# Patient Record
Sex: Female | Born: 1985 | Race: White | Hispanic: No | Marital: Single | Smoking: Current every day smoker
Health system: Southern US, Community
[De-identification: ages and names within clinical notes are randomized; demographics above are authoritative.]

## PROBLEM LIST (undated history)

## (undated) ENCOUNTER — Ambulatory Visit: Admission: EM | Payer: Medicaid Other | Source: Home / Self Care

## (undated) DIAGNOSIS — K589 Irritable bowel syndrome without diarrhea: Secondary | ICD-10-CM

## (undated) DIAGNOSIS — K5289 Other specified noninfective gastroenteritis and colitis: Secondary | ICD-10-CM

## (undated) DIAGNOSIS — F419 Anxiety disorder, unspecified: Secondary | ICD-10-CM

## (undated) DIAGNOSIS — J45909 Unspecified asthma, uncomplicated: Secondary | ICD-10-CM

## (undated) DIAGNOSIS — K219 Gastro-esophageal reflux disease without esophagitis: Secondary | ICD-10-CM

## (undated) DIAGNOSIS — F4310 Post-traumatic stress disorder, unspecified: Secondary | ICD-10-CM

## (undated) DIAGNOSIS — M009 Pyogenic arthritis, unspecified: Secondary | ICD-10-CM

## (undated) DIAGNOSIS — R51 Headache: Secondary | ICD-10-CM

## (undated) DIAGNOSIS — K3184 Gastroparesis: Secondary | ICD-10-CM

## (undated) DIAGNOSIS — M797 Fibromyalgia: Secondary | ICD-10-CM

## (undated) DIAGNOSIS — IMO0002 Reserved for concepts with insufficient information to code with codable children: Secondary | ICD-10-CM

## (undated) DIAGNOSIS — R011 Cardiac murmur, unspecified: Secondary | ICD-10-CM

## (undated) DIAGNOSIS — I509 Heart failure, unspecified: Secondary | ICD-10-CM

## (undated) DIAGNOSIS — O211 Hyperemesis gravidarum with metabolic disturbance: Secondary | ICD-10-CM

## (undated) DIAGNOSIS — F1121 Opioid dependence, in remission: Secondary | ICD-10-CM

## (undated) DIAGNOSIS — Z3490 Encounter for supervision of normal pregnancy, unspecified, unspecified trimester: Secondary | ICD-10-CM

## (undated) DIAGNOSIS — R7881 Bacteremia: Secondary | ICD-10-CM

## (undated) DIAGNOSIS — K760 Fatty (change of) liver, not elsewhere classified: Secondary | ICD-10-CM

## (undated) DIAGNOSIS — D649 Anemia, unspecified: Secondary | ICD-10-CM

## (undated) HISTORY — DX: Headache: R51

## (undated) HISTORY — DX: Heart failure, unspecified: I50.9

## (undated) HISTORY — PX: APPENDECTOMY: SHX54

## (undated) HISTORY — DX: Gastro-esophageal reflux disease without esophagitis: K21.9

## (undated) HISTORY — DX: Opioid dependence, in remission: F11.21

## (undated) HISTORY — DX: Fatty (change of) liver, not elsewhere classified: K76.0

## (undated) HISTORY — DX: Irritable bowel syndrome, unspecified: K58.9

## (undated) HISTORY — DX: Gastroparesis: K31.84

## (undated) HISTORY — DX: Anemia, unspecified: D64.9

## (undated) HISTORY — DX: Reserved for concepts with insufficient information to code with codable children: IMO0002

## (undated) HISTORY — PX: CHOLECYSTECTOMY: SHX55

## (undated) HISTORY — PX: COLONOSCOPY: SHX174

## (undated) HISTORY — DX: Other specified noninfective gastroenteritis and colitis: K52.89

## (undated) HISTORY — DX: Hyperemesis gravidarum with metabolic disturbance: O21.1

## (undated) HISTORY — DX: Unspecified asthma, uncomplicated: J45.909

## (undated) HISTORY — PX: TUBAL LIGATION: SHX77

---
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
## 2004-04-22 ENCOUNTER — Emergency Department (HOSPITAL_COMMUNITY): Admission: EM | Admit: 2004-04-22 | Discharge: 2004-04-22 | Payer: Self-pay | Admitting: *Deleted

## 2006-03-06 ENCOUNTER — Emergency Department (HOSPITAL_COMMUNITY): Admission: EM | Admit: 2006-03-06 | Discharge: 2006-03-06 | Payer: Self-pay | Admitting: Emergency Medicine

## 2006-03-19 ENCOUNTER — Emergency Department (HOSPITAL_COMMUNITY): Admission: EM | Admit: 2006-03-19 | Discharge: 2006-03-19 | Payer: Self-pay | Admitting: Emergency Medicine

## 2006-08-20 ENCOUNTER — Emergency Department (HOSPITAL_COMMUNITY): Admission: EM | Admit: 2006-08-20 | Discharge: 2006-08-20 | Payer: Self-pay | Admitting: Emergency Medicine

## 2006-09-17 ENCOUNTER — Observation Stay (HOSPITAL_COMMUNITY): Admission: RE | Admit: 2006-09-17 | Discharge: 2006-09-18 | Payer: Self-pay | Admitting: General Surgery

## 2006-09-17 ENCOUNTER — Encounter (INDEPENDENT_AMBULATORY_CARE_PROVIDER_SITE_OTHER): Payer: Self-pay | Admitting: General Surgery

## 2006-12-14 ENCOUNTER — Emergency Department (HOSPITAL_COMMUNITY): Admission: EM | Admit: 2006-12-14 | Discharge: 2006-12-14 | Payer: Self-pay | Admitting: Emergency Medicine

## 2007-03-06 ENCOUNTER — Ambulatory Visit: Payer: Self-pay | Admitting: Gastroenterology

## 2007-03-06 ENCOUNTER — Inpatient Hospital Stay (HOSPITAL_COMMUNITY): Admission: EM | Admit: 2007-03-06 | Discharge: 2007-03-07 | Payer: Self-pay | Admitting: Emergency Medicine

## 2007-03-06 ENCOUNTER — Encounter: Payer: Self-pay | Admitting: Gastroenterology

## 2007-03-07 ENCOUNTER — Ambulatory Visit: Payer: Self-pay | Admitting: Gastroenterology

## 2007-03-19 ENCOUNTER — Ambulatory Visit: Payer: Self-pay | Admitting: Gastroenterology

## 2007-03-24 ENCOUNTER — Ambulatory Visit (HOSPITAL_COMMUNITY): Admission: RE | Admit: 2007-03-24 | Discharge: 2007-03-24 | Payer: Self-pay | Admitting: Gastroenterology

## 2007-03-31 ENCOUNTER — Encounter (HOSPITAL_COMMUNITY): Admission: RE | Admit: 2007-03-31 | Discharge: 2007-04-30 | Payer: Self-pay | Admitting: Gastroenterology

## 2007-04-23 ENCOUNTER — Ambulatory Visit: Payer: Self-pay | Admitting: Gastroenterology

## 2007-07-30 ENCOUNTER — Ambulatory Visit: Payer: Self-pay | Admitting: Gastroenterology

## 2007-11-19 ENCOUNTER — Emergency Department (HOSPITAL_COMMUNITY): Admission: EM | Admit: 2007-11-19 | Discharge: 2007-11-19 | Payer: Self-pay | Admitting: Emergency Medicine

## 2007-12-01 ENCOUNTER — Emergency Department (HOSPITAL_COMMUNITY): Admission: EM | Admit: 2007-12-01 | Discharge: 2007-12-01 | Payer: Self-pay | Admitting: Emergency Medicine

## 2007-12-19 ENCOUNTER — Emergency Department (HOSPITAL_COMMUNITY): Admission: EM | Admit: 2007-12-19 | Discharge: 2007-12-19 | Payer: Self-pay | Admitting: Emergency Medicine

## 2008-02-02 ENCOUNTER — Emergency Department (HOSPITAL_COMMUNITY): Admission: EM | Admit: 2008-02-02 | Discharge: 2008-02-02 | Payer: Self-pay | Admitting: Emergency Medicine

## 2008-03-16 ENCOUNTER — Other Ambulatory Visit: Admission: RE | Admit: 2008-03-16 | Discharge: 2008-03-16 | Payer: Self-pay | Admitting: Obstetrics and Gynecology

## 2008-04-28 ENCOUNTER — Telehealth (INDEPENDENT_AMBULATORY_CARE_PROVIDER_SITE_OTHER): Payer: Self-pay

## 2008-04-28 DIAGNOSIS — R51 Headache: Secondary | ICD-10-CM

## 2008-04-28 DIAGNOSIS — K3184 Gastroparesis: Secondary | ICD-10-CM

## 2008-04-28 DIAGNOSIS — F411 Generalized anxiety disorder: Secondary | ICD-10-CM | POA: Insufficient documentation

## 2008-04-28 DIAGNOSIS — J45909 Unspecified asthma, uncomplicated: Secondary | ICD-10-CM | POA: Insufficient documentation

## 2008-04-28 DIAGNOSIS — R109 Unspecified abdominal pain: Secondary | ICD-10-CM | POA: Insufficient documentation

## 2008-04-28 DIAGNOSIS — R110 Nausea: Secondary | ICD-10-CM | POA: Insufficient documentation

## 2008-04-28 DIAGNOSIS — R519 Headache, unspecified: Secondary | ICD-10-CM | POA: Insufficient documentation

## 2008-04-29 ENCOUNTER — Ambulatory Visit: Payer: Self-pay | Admitting: Internal Medicine

## 2008-04-29 DIAGNOSIS — K5289 Other specified noninfective gastroenteritis and colitis: Secondary | ICD-10-CM

## 2008-04-29 DIAGNOSIS — K5900 Constipation, unspecified: Secondary | ICD-10-CM | POA: Insufficient documentation

## 2008-07-01 ENCOUNTER — Emergency Department (HOSPITAL_COMMUNITY): Admission: EM | Admit: 2008-07-01 | Discharge: 2008-07-01 | Payer: Self-pay | Admitting: Emergency Medicine

## 2008-10-08 ENCOUNTER — Emergency Department (HOSPITAL_COMMUNITY): Admission: EM | Admit: 2008-10-08 | Discharge: 2008-10-08 | Payer: Self-pay | Admitting: Emergency Medicine

## 2008-10-23 ENCOUNTER — Inpatient Hospital Stay (HOSPITAL_COMMUNITY): Admission: AD | Admit: 2008-10-23 | Discharge: 2008-10-26 | Payer: Self-pay | Admitting: Obstetrics and Gynecology

## 2009-02-02 ENCOUNTER — Ambulatory Visit: Payer: Self-pay | Admitting: Gastroenterology

## 2009-02-02 DIAGNOSIS — D649 Anemia, unspecified: Secondary | ICD-10-CM

## 2009-02-02 DIAGNOSIS — K219 Gastro-esophageal reflux disease without esophagitis: Secondary | ICD-10-CM

## 2009-02-02 DIAGNOSIS — O211 Hyperemesis gravidarum with metabolic disturbance: Secondary | ICD-10-CM | POA: Insufficient documentation

## 2009-02-02 DIAGNOSIS — K589 Irritable bowel syndrome without diarrhea: Secondary | ICD-10-CM

## 2009-02-03 ENCOUNTER — Encounter: Payer: Self-pay | Admitting: Urgent Care

## 2009-02-06 LAB — CONVERTED CEMR LAB
Basophils Absolute: 0 10*3/uL (ref 0.0–0.1)
Basophils Relative: 0 % (ref 0–1)
Cortisol - AM: 5.9 ug/dL (ref 4.3–22.4)
Eosinophils Absolute: 0.2 10*3/uL (ref 0.0–0.7)
Hemoglobin: 13.8 g/dL (ref 12.0–15.0)
Hgb A1c MFr Bld: 5.6 % (ref 4.6–6.1)
MCV: 91.4 fL (ref 78.0–100.0)
Monocytes Absolute: 0.5 10*3/uL (ref 0.1–1.0)
Monocytes Relative: 7 % (ref 3–12)
Platelets: 250 10*3/uL (ref 150–400)
RDW: 13.7 % (ref 11.5–15.5)

## 2009-02-07 ENCOUNTER — Ambulatory Visit (HOSPITAL_COMMUNITY): Admission: RE | Admit: 2009-02-07 | Discharge: 2009-02-07 | Payer: Self-pay | Admitting: Neurology

## 2009-03-09 ENCOUNTER — Encounter (INDEPENDENT_AMBULATORY_CARE_PROVIDER_SITE_OTHER): Payer: Self-pay | Admitting: *Deleted

## 2009-04-14 ENCOUNTER — Ambulatory Visit (HOSPITAL_COMMUNITY): Admission: RE | Admit: 2009-04-14 | Discharge: 2009-04-14 | Payer: Self-pay | Admitting: Family Medicine

## 2009-05-22 ENCOUNTER — Telehealth (INDEPENDENT_AMBULATORY_CARE_PROVIDER_SITE_OTHER): Payer: Self-pay

## 2009-06-01 ENCOUNTER — Encounter (INDEPENDENT_AMBULATORY_CARE_PROVIDER_SITE_OTHER): Payer: Self-pay | Admitting: *Deleted

## 2009-06-08 ENCOUNTER — Ambulatory Visit: Payer: Self-pay | Admitting: Gastroenterology

## 2009-06-08 DIAGNOSIS — R1319 Other dysphagia: Secondary | ICD-10-CM

## 2009-06-29 ENCOUNTER — Ambulatory Visit: Payer: Self-pay | Admitting: Gastroenterology

## 2009-06-29 ENCOUNTER — Ambulatory Visit (HOSPITAL_COMMUNITY): Admission: RE | Admit: 2009-06-29 | Discharge: 2009-06-29 | Payer: Self-pay | Admitting: Gastroenterology

## 2009-06-29 ENCOUNTER — Telehealth (INDEPENDENT_AMBULATORY_CARE_PROVIDER_SITE_OTHER): Payer: Self-pay

## 2009-08-30 ENCOUNTER — Emergency Department (HOSPITAL_COMMUNITY): Admission: EM | Admit: 2009-08-30 | Discharge: 2009-08-30 | Payer: Self-pay | Admitting: Emergency Medicine

## 2009-09-26 ENCOUNTER — Emergency Department (HOSPITAL_COMMUNITY): Admission: EM | Admit: 2009-09-26 | Discharge: 2009-09-26 | Payer: Self-pay | Admitting: Internal Medicine

## 2010-02-01 ENCOUNTER — Ambulatory Visit (HOSPITAL_COMMUNITY)
Admission: RE | Admit: 2010-02-01 | Discharge: 2010-02-01 | Payer: Self-pay | Source: Home / Self Care | Attending: Family Medicine | Admitting: Family Medicine

## 2010-02-15 NOTE — Letter (Signed)
Summary: EGD/ED ORDER  EGD/ED ORDER   Imported By: Ave Filter 06/08/2009 13:00:02  _____________________________________________________________________  External Attachment:    Type:   Image     Comment:   External Document

## 2010-02-15 NOTE — Assessment & Plan Note (Signed)
Summary: bloating & abdominal pain- cdg   Visit Type:  Follow-up Visit Primary Care Provider:  Dr. Lynett Fish  Chief Complaint:  bloating and abd pain.  History of Present Illness: 25 y/o caucasian female with hx GERD, IBS, gastroparesis, & hyperemesis gravidarum at last OV here.  Out of prilosec for the last year, but taking spaingly when needed OTC.  c/o heartburn all day.   Nausea w/ some dry heaves without vomiting since delivery.  s/p full term pregnancy w/ 58 month old infant.  c/o abd bloating.  Also c/o post-prandial urgency to defecate.  c/o loose stools 5 times/day with some mucous since delivery.  c/o "shooting" lower abd pain resolved w/ defecation.  MRI tomorrow for "bad HA" every day all day.  Seeing Dr Gerilyn Pilgrim.  Taking fiorcet.  Started celexa 6 weeks ago.  Denies NSAIDs.   She had a C-reactive protein, tissue transglutaminase and quantitative immunoglobulin which were all within normal limits previously.  On iron since delivery.  Hgb 11 grams 1 month post-partum per pt.    02/2007 ILEOCOLONOSCOPY FINDINGS: Bx non-specific colitis, differentials include ischemia, infection, & NSAIDs. Patchy erythema with ulceration and mucosal sparing seen beginning approximately 20 cm from the anal verge and extending to the hepatic flexure.  The rectum, ascending colon, and cecum were spared, normal TI.       This is a 25 year old female who presents with iron deficiency anemia.  Prior effective treatment has included iron supplementation.  The patient complains of fatigue, but denies black stools.         The patient also presents with heartburn / GERD.  The patient complains of heartburn, regurgtaition of food, sour taste in mouth, and weight gain, but denies epigastric pain, chest pain, trouble swallowing, and weight loss.    Current Problems (verified): 1)  Hx of Hyperemesis Gravidarum  (ICD-643.10) 2)  Anemia  (ICD-285.9) 3)  Constipation  (ICD-564.00) 4)  Headache  (ICD-784.0) 5)  Asthma   (ICD-493.90) 6)  Gastroparesis  (ICD-536.3) 7)  Anxiety  (ICD-300.00) 8)  Hx of Colitis  (ICD-558.9) 9)  Nausea  (ICD-787.02) 10)  Abdominal Pain  (ICD-789.00)  Current Medications (verified): 1)  Prilosec Otc 20 Mg Tbec (Omeprazole Magnesium) .... Once Daily 2)  Celexa 20 Mg Tabs (Citalopram Hydrobromide) .... Once Daily 3)  Ferrous Sulfate 325 (65 Fe) Mg Tabs (Ferrous Sulfate) .... Two Times A Day 4)  Fioricet 50-325-40 Mg Tabs (Butalbital-Apap-Caffeine) .... As Needed  Allergies: 1)  ! Zithromax 2)  ! Prednisone 3)  ! Pcn  Past History:  Past Medical History: ABDOMINAL PAIN (ICD-789.00) Hx of HYPEREMESIS GRAVIDARUM (ICD-643.10) ANEMIA (ICD-285.9) ? related to pregnancy CONSTIPATION (ICD-564.00) HEADACHE (ICD-784.0) ASTHMA (ICD-493.90) GASTROPARESIS (ICD-536.3) ANXIETY (ICD-300.00) Hx of non-specific COLITIS (ICD-558.9)  Past Surgical History: APPENDECTOMY 2004 Cholecystectomy  Family History: mother IBS, otherwise No known family history of colorectal carcinoma, IBD, liver or chronic GI problems.  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   Head:  Normocephalic and atraumatic. Eyes:  Sclera clear, no icterus. Ears:  Normal auditory acuity. Mouth:  No deformity or lesions, dentition normal. Neck:  Supple; no masses or thyromegaly. Heart:  Regular rate and rhythm; no murmurs, rubs,  or bruits. Abdomen:  normal bowel sounds, obese, without guarding, without rebound, no hernia, no distesion, no tenderness, no masses, and no hepatomegally or splenomegaly.   Msk:  Symmetrical with no gross  deformities. Normal posture. Pulses:  Normal pulses noted. Extremities:  No clubbing, cyanosis, edema or deformities noted. Neurologic:  Alert and  oriented x4;  grossly normal neurologically. Skin:  Intact without significant lesions or rashes. Cervical Nodes:  No significant cervical adenopathy. Psych:  Alert and cooperative. Normal mood and affect.  Impression & Recommendations:  Problem # 1:  GASTROPARESIS (ICD-536.3) 25 y/o caucasian female presents for follow up for gastroparesis, suspected IBS, & Chronic GERD.  She has hx of medication and recommendation non-compliance.  She has been having daily diarrhea & GERD x 3 months.  c/o Nausea without emesis.  Hx anemia, possibly due to pregnancy, on iron.  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  Previous GI work-up extensive including colonoscopy, labs for celiac disease, GES, but she has never had EGD.  If she has PPI failure for GERD, would proceed with EGD.  If she remains w/ IDA, would check stool for occult bleeding, ? underlying IBD, but suspect this is physiologic to pregnancy.  Gastroparesis culprit most likely depression but may have underlying DM.   Orders: T-CBC w/Diff ([id number]) T-Sed Rate (Automated) [phone]) T-Hemoglobin A1C (29[address]) T-TSH ([id number]) T-Cortisol, AM ([address])  Problem # 2:  GERD (ICD-530.81) See #1  Problem # 3:  IRRITABLE BOWEL SYNDROME (ICD-564.1) See#1  Problem # 4:  ANEMIA (ICD-285.9) See #1  Other Orders: Est. Patient Level IV (284[address])  Patient Instructions: 1)  Resume omeprazole 20mg  daily for acid reflux 2)  Take dicyclomine 10mg  48m ins before meals 3)  Gastroparesis diet 4)  Labs today/tomorrow 5)  The medication list was reviewed and reconciled.  All changed / newly prescribed medications were explained.  A complete medication list was provided to the patient / caregiver. Prescriptions: DICYCLOMINE HCL 10 MG CAPS (DICYCLOMINE HCL) one by mouth 30 mins before meals (up to three  times a day)  #90 x 2   Entered and Authorized by:   Joselyn Arrow FNP-BC   Signed by:   Joselyn Arrow FNP-BC on 02/02/2009   Method used:   Electronically to        [facility] [phone]* (retail)       [address] [place], [place]  [address]       Ph: [phone]       Fax: [phone]   RxID:   [id number] PRILOSEC OTC 20 MG TBEC (OMEPRAZOLE MAGNESIUM) once daily  #31 x 11   Entered and Authorized by:   Joselyn Arrow FNP-BC   Signed by:   Joselyn Arrow FNP-BC on 02/02/2009   Method used:   Electronically to        [facility] [address] [phone]* (retail)       [address] [place], [place]  [address]       Ph: [phone]       Fax: [phone]   RxID:   [id number]   Appended Document: bloating & abdominal pain- cdg Schedule pt  OPV in 2 mos w/ slf. She has gained  ~41 lbs since APR 2009. Will await labs. EGD perhaps if labs show indication. Most likely pt has IBS/worsening GERD due to weight gain.  Appended Document: bloating & abdominal pain- cdg Please call pt. We do not prescribe narcotics for IBS. She should use tylenol and continue Dicyclomine and Prilosec.  Appended Document: bloating & abdominal pain- cdg pt aware

## 2010-02-15 NOTE — Letter (Signed)
Summary: DO NOT USE URG SLOTS UNLESS APPROVED BY [facility]./MM  Phone Note Call from Patient   Caller: Patient Summary of Call: pt called saying she is having a lot of stomach problems and felt like she was going to develope stomach cancer before her appt on June 9th with Dr. Darrick Penna. She wants to be seen before then. Please advise what to do with her. She can be reached @ [phone]  Initial call taken by: Peggyann Shoals,  Jun 01, 2009 9:31 AM     Appended Document:  LMOM to call.  Appended Document:  Pt is requesting an appt earlier than June 9th with Dr. Darrick Penna. Diarrhea x 7 today already. mucus in stool. abd pain. Heart burn after each meal. yesterday she had corn pops for breakfast, ham sandwich (meat and bread only ) for lunch, and supper had macaroni salad. I told her Dr. Darrick Penna is away but i would check with Dr. Jena Gauss and see what he recommends until Dr. Darrick Penna gets back.   Appended Document:  I would offer appt next week w extender (or urgent w Dr. Darrick Penna)  Appended Document:  Pt aware and Darl Pikes to schedule.  Appended Document:  I cancelled previous appt for 6-9 and made appt for 5-26 w/SF in urgent spot. Pt still isnt happy with that date, but I told her that is all that is available.

## 2010-02-15 NOTE — Progress Notes (Signed)
Summary: abd pain  Phone Note Call from Patient   Caller: Patient Summary of Call: pt called- has been having some abd pain again that makes her legs hurt as well. she felt she was doing good on" the hydrocodone" and wants to know if [facility] will call in some for her again to last untill her ov appt June 9th. please advise Initial call taken by: Hendricks Limes LPN,  May 22, 9809 12:15 PM     Appended Document: abd pain Please call pt. We do not prescribe narcotics for IBS. She should use tylenol and continue Dicyclomine and Prilosec.   Signed by West Bali MD on 05/23/2009 at 7:57 AM  Appended Document: abd pain pt aware

## 2010-02-15 NOTE — Progress Notes (Signed)
Summary: phone note/throat pain from EGD  Phone Note Call from Patient   Caller: Patient Summary of Call: Pt called and said she had EGD/ED this Am and her throat is hurting. She requested something for the pain. I told her it is  normal to have some pain immediatedly following and invasive procedure. I would make Dr. Darrick Penna aware that she has called and for pt to call back if pain worsens.  Initial call taken by: Cloria Spring LPN,  June 29, 2009 10:43 AM     Appended Document: phone note/throat pain from EGD Please call pt. She may have Chloraseptic Spray top as needed throat pain, #1, rfx0.  Appended Document: phone note/throat pain from EGD LMOM to call.  Appended Document: phone note/throat pain from EGD Pt informed.

## 2010-02-15 NOTE — Letter (Signed)
Summary: Appointment Reminder  [facility]  [address]   [place], [place] [address]   Phone: [phone]  Fax: [phone]       March 09, 2009   [name] [address] [place], [place]  [address] 08-Jun-1985    Dear [name],  We have been unable to reach you by phone to schedule a follow up   appointment that was recommended for you by Dr. Darrick Penna. It is very   important that we reach you to schedule an appointment. We hope that you  allow [place] to participate in your health care needs. Please contact us at  [phone] at your earliest convenience to schedule your appointment.  Sincerely,    [facility] R. Roetta Sessions, M.D.    Kassie Mends, M.D. Lorenza Burton, FNP-BC    Tana Coast, PA-C Phone: [phone]    Fax: [phone]

## 2010-02-15 NOTE — Assessment & Plan Note (Signed)
Summary: DYSPHAGIA, IBS, GASTROPARESIS, GERD   Visit Type:  Follow-up Visit Referring Provider:  [facility] Primary Care Provider:  Nobie Putnam, M.D.  Chief Complaint:  diarrhea.  History of Present Illness: Having nausea every day. Vomiting 3x/day. No weight loss. Bms: watery and mucosy, 5x in 2 hours. Up with nausea and vomiting and also had diarrhea. Problems swallowing: 1-2 weeks, gets it in esophagus but it stops with solids. Has heartburn terribly every day. Pain in belly: lower, knifelike, better after a BM. If needs to have BM stomach hurts bad and with BM pain into lower back and into left leg. Sweating at night. No blood in vomit or stool. No ASA, BC, Goddys, Ibuprofen, or Aleve.    Current Medications (verified): 1)  Celexa 20 Mg Tabs (Citalopram Hydrobromide) .... Once Daily  Allergies (verified): 1)  ! Zithromax 2)  ! Prednisone 3)  ! Pcn  Past History:  Past Medical History: GASTROPARESIS (ICD-536.3) IRRITABLE BOWEL SYNDROME, diarrhea predominant ABDOMINAL PAIN (ICD-789.00) Hx of HYPEREMESIS GRAVIDARUM (ICD-643.10) ANEMIA (ICD-285.9) ? related to pregnancy CONSTIPATION  with pregnancy HEADACHE (ICD-784.0) ASTHMA (ICD-493.90) ANXIETY (ICD-300.00) Hx of non-specific COLITIS (ICD-558.9)  Past Surgical History: Reviewed history from 02/02/2009 and no changes required. 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 Has TATA HEAD on right wrist in his memory.  Review of Systems       LMP: 3-4 days ago, sexually active, no OCP.  Vital Signs:  Patient profile:   25 year old female Height:      68 inches Weight:      203 pounds BMI:     30.98 Temp:     98.9 degrees F oral Pulse rate:   80 / minute BP sitting:   100 / 80  (left arm) Cuff size:   regular  Vitals Entered By: Cloria Spring LPN (Jun 08, 2009 11:42 AM)  Physical Exam  General:  Well  developed, well nourished, no acute distress. Head:  Normocephalic and atraumatic. Eyes:  PERRLA, no icterus. Mouth:  No deformity or lesions. Neck:  Supple; no masses. Lungs:  Clear throughout to auscultation. Heart:  Regular rate and rhythm; no murmurs. Abdomen:  Soft, MILD ttp IN EPIGASTRIUM without guarding and without rebound, nondistended. Normal bowel sounds.obese. Extremities:  No edema or deformities noted. TATTO ON RIGHT WRIST, FOOT, AND THIGH. Neurologic:  Alert and  oriented x4;  grossly normal neurologically.  Impression & Recommendations:  Problem # 1:  OTHER DYSPHAGIA (ICD-787.29)  Differential includes peptic stricture, or 2o EMD due to uncontrolled Reflux.  Pt awakened 3 times during TCS in spite of high dose of Demerol, Versed, and Phenergan. Upper endoscopy with possible dilation on June 16. WILL USE PROPOFOL. Pt was difficult to sedate for TCS. NEEDS DUODENAL BIOPSIES.  Orders: Est. Patient Level V ([address])  Problem # 2:  IRRITABLE BOWEL SYNDROME (ICD-564.1) Assessment: Deteriorated  DIARRHEA PREDOMINANT ibs CAUSING ABD PAIN AND DIARRHEA. Pt off meds for IBS. She should PICK UP PRILOSEC, BENTYL, AND PHENERGAN at the pharmacy. LACTOSE FREE DIET. HO GIVEN. Add BENEFIBER or equivalent once daily. Digestive Advantage DAILY Use BENTYL 30 minutes before meals. May use Tylenol extra strength or arthritis as needed.  Orders: Est. Patient Level V ([address])  Problem # 3:  GASTROPARESIS (ICD-536.3) Assessment: Deteriorated  Pt off meds and diet. Sx exacerbated by uncontrolled reflyx. She did not tolerate REGLAN. GASTROPARESIS DIET. ho GIVEN. Use Phenergan as  needed FOR VOMITING. Return visit in SIX WEEKS. mAY NEED TO ADD Erythromycin or Domperidone. Pt advised to lose 20 lbs.  Orders: Est. Patient Level V ([address])  Problem # 4:  GERD (ICD-530.81) Assessment: Deteriorated Pt off meds, smokes, and has gained 40 lbs since 2009. Stop smoking. Add two times a day OMP. Follow a  low fat diet.  CC: PCP  Patient Instructions: 1)  MANAGEMENT OF DIARRHEA, PAIN, NAUSEA, AND VOMITING 2)  PICK UP PRILOSEC, BENTYL, AND PHENERGAN at the pharmacy. 3)  REDUCE SMOKING TO 1/2 PK PER DAY. 4)  LACTOSE FREE/GASTROPARESIS DIET. 5)  LOSE 20 LBS. 6)  Add BENEFIBER or equivalent once daily. 7)  Digestive Advantage DAILY 8)  Use BENTYL 30 minutes before meals. 9)  May use Tylenol extra strength or arthritis as needed. 10)  Use Phenergan as needed FOR VOMITING 11)  Upper endoscopy with possible dilation on June 16. 12)  Return visit in SIX WEEKS. 13)  The medication list was reviewed and reconciled.  All changed / newly prescribed medications were explained.  A complete medication list was provided to the patient / caregiver. Prescriptions: PROMETHAZINE HCL 25 MG TABS (PROMETHAZINE HCL) 1/2-1 by mouth q4-6h as needed nausea or vomiting  #30 x 1   Entered and Authorized by:   West Bali MD   Signed by:   West Bali MD on 06/08/2009   Method used:   Electronically to        [facility] [address] [phone]* (retail)       [address] [place], [place]  [address]       Ph: [phone]       Fax: [phone]   RxID:   [id number] BENTYL 10 MG CAPS (DICYCLOMINE HCL) 1 by mouth 30 minutes prior to breakfast, lunch, and supper  #90 x 5   Entered and Authorized by:   West Bali MD   Signed by:   West Bali MD on 06/08/2009   Method used:   Electronically to        [facility] [address] [phone]* (retail)       [address] [place]  [address]       Ph: [phone]       Fax: [phone]   RxID:   [id number] PRILOSEC 20 MG CPDR (OMEPRAZOLE) 1 by mouth 30 minutes prior to breakfast and suppre  #60 x 5   Entered and Authorized by:   West Bali MD   Signed by:   West Bali MD on 06/08/2009   Method used:   Electronically to        [facility] [phone]* (retail)       [address] [place], [place]  [address]       Ph:  [phone]       Fax: [phone]   RxID:   [id number]   Appended Document: DYSPHAGIA, IBS, GASTROPARESIS, GERD Pt aware of appt for 07/20/09 @ 1030am w/LSL

## 2010-03-10 ENCOUNTER — Emergency Department (HOSPITAL_COMMUNITY)
Admission: EM | Admit: 2010-03-10 | Discharge: 2010-03-10 | Disposition: A | Discharge status: Home / Self Care | Payer: BC Managed Care – PPO | Attending: Emergency Medicine | Admitting: Emergency Medicine

## 2010-03-10 DIAGNOSIS — G501 Atypical facial pain: Secondary | ICD-10-CM | POA: Insufficient documentation

## 2010-03-10 DIAGNOSIS — M2669 Other specified disorders of temporomandibular joint: Secondary | ICD-10-CM | POA: Insufficient documentation

## 2010-03-17 ENCOUNTER — Emergency Department (HOSPITAL_COMMUNITY)
Admission: EM | Admit: 2010-03-17 | Discharge: 2010-03-17 | Discharge status: Against Medical Advice | Payer: BC Managed Care – PPO | Attending: Emergency Medicine | Admitting: Emergency Medicine

## 2010-03-17 DIAGNOSIS — M26629 Arthralgia of temporomandibular joint, unspecified side: Secondary | ICD-10-CM | POA: Insufficient documentation

## 2010-03-29 LAB — URINALYSIS, ROUTINE W REFLEX MICROSCOPIC
Bilirubin Urine: NEGATIVE
Bilirubin Urine: NEGATIVE
Glucose, UA: NEGATIVE mg/dL
Glucose, UA: NEGATIVE mg/dL
Hgb urine dipstick: NEGATIVE
Ketones, ur: NEGATIVE mg/dL
Leukocytes, UA: NEGATIVE
Leukocytes, UA: NEGATIVE
Nitrite: POSITIVE — AB
Nitrite: POSITIVE — AB
Protein, ur: NEGATIVE mg/dL
Specific Gravity, Urine: 1.02 (ref 1.005–1.030)
pH: 8.5 — ABNORMAL HIGH (ref 5.0–8.0)

## 2010-03-29 LAB — URINE CULTURE: Colony Count: >=100000

## 2010-03-29 LAB — URINE MICROSCOPIC-ADD ON

## 2010-04-02 LAB — BASIC METABOLIC PANEL
BUN: 7 mg/dL (ref 6–23)
Calcium: 8.9 mg/dL (ref 8.4–10.5)
Chloride: 109 mEq/L (ref 96–112)
Creatinine, Ser: 0.57 mg/dL (ref 0.4–1.2)
GFR calc non Af Amer: >60 mL/min (ref >60)
Glucose, Bld: 88 mg/dL (ref 70–99)
Potassium: 4 mEq/L (ref 3.5–5.1)
Sodium: 136 mEq/L (ref 135–145)

## 2010-04-02 LAB — HCG, QUANTITATIVE, PREGNANCY: hCG, Beta Chain, Quant, S: <2 m[IU]/mL (ref <5)

## 2010-04-02 LAB — HEMOGLOBIN AND HEMATOCRIT, BLOOD: Hemoglobin: 13.7 g/dL (ref 12.0–15.0)

## 2010-04-14 ENCOUNTER — Emergency Department (HOSPITAL_COMMUNITY)
Admission: EM | Admit: 2010-04-14 | Discharge: 2010-04-14 | Disposition: A | Discharge status: Home / Self Care | Payer: BC Managed Care – PPO | Attending: Emergency Medicine | Admitting: Emergency Medicine

## 2010-04-14 DIAGNOSIS — Z760 Encounter for issue of repeat prescription: Secondary | ICD-10-CM | POA: Insufficient documentation

## 2010-04-19 LAB — CBC
HCT: 33.8 % — ABNORMAL LOW (ref 36.0–46.0)
Hemoglobin: 11.7 g/dL — ABNORMAL LOW (ref 12.0–15.0)
Hemoglobin: 9.4 g/dL — ABNORMAL LOW (ref 12.0–15.0)
MCHC: 34.6 g/dL (ref 30.0–36.0)
MCV: 95.1 fL (ref 78.0–100.0)
RBC: 2.84 MIL/uL — ABNORMAL LOW (ref 3.87–5.11)
RDW: 12.5 % (ref 11.5–15.5)
WBC: 11.3 10*3/uL — ABNORMAL HIGH (ref 4.0–10.5)
WBC: 11.4 10*3/uL — ABNORMAL HIGH (ref 4.0–10.5)

## 2010-04-23 LAB — DIFFERENTIAL
Basophils Absolute: 0 10*3/uL (ref 0.0–0.1)
Basophils Relative: 0 % (ref 0–1)
Eosinophils Absolute: 0.2 10*3/uL (ref 0.0–0.7)
Eosinophils Relative: 1 % (ref 0–5)
Lymphocytes Relative: 10 % — ABNORMAL LOW (ref 12–46)
Monocytes Absolute: 0.8 10*3/uL (ref 0.1–1.0)

## 2010-04-23 LAB — URINE MICROSCOPIC-ADD ON

## 2010-04-23 LAB — COMPREHENSIVE METABOLIC PANEL
ALT: 38 U/L — ABNORMAL HIGH (ref 0–35)
AST: 41 U/L — ABNORMAL HIGH (ref 0–37)
Albumin: 2.9 g/dL — ABNORMAL LOW (ref 3.5–5.2)
Alkaline Phosphatase: 66 U/L (ref 39–117)
CO2: 25 mEq/L (ref 19–32)
Chloride: 108 mEq/L (ref 96–112)
GFR calc Af Amer: >60 mL/min (ref >60)
GFR calc non Af Amer: >60 mL/min (ref >60)
Potassium: 3.2 mEq/L — ABNORMAL LOW (ref 3.5–5.1)
Sodium: 139 mEq/L (ref 135–145)
Total Bilirubin: 0.4 mg/dL (ref 0.3–1.2)

## 2010-04-23 LAB — URINALYSIS, ROUTINE W REFLEX MICROSCOPIC
Glucose, UA: NEGATIVE mg/dL
Hgb urine dipstick: NEGATIVE
Ketones, ur: NEGATIVE mg/dL
Protein, ur: NEGATIVE mg/dL
pH: 5.5 (ref 5.0–8.0)

## 2010-04-23 LAB — CBC
Platelets: 260 10*3/uL (ref 150–400)
RBC: 3.57 MIL/uL — ABNORMAL LOW (ref 3.87–5.11)
WBC: 18.5 10*3/uL — ABNORMAL HIGH (ref 4.0–10.5)

## 2010-05-04 ENCOUNTER — Ambulatory Visit (HOSPITAL_COMMUNITY)
Admission: RE | Admit: 2010-05-04 | Discharge: 2010-05-04 | Disposition: A | Discharge status: Home / Self Care | Payer: BC Managed Care – PPO | Source: Ambulatory Visit | Attending: Family Medicine | Admitting: Family Medicine

## 2010-05-04 ENCOUNTER — Other Ambulatory Visit (HOSPITAL_COMMUNITY): Payer: Self-pay | Admitting: Family Medicine

## 2010-05-04 DIAGNOSIS — M25561 Pain in right knee: Secondary | ICD-10-CM

## 2010-05-04 DIAGNOSIS — M545 Low back pain, unspecified: Secondary | ICD-10-CM | POA: Insufficient documentation

## 2010-05-04 DIAGNOSIS — M25569 Pain in unspecified knee: Secondary | ICD-10-CM | POA: Insufficient documentation

## 2010-05-29 NOTE — Op Note (Signed)
NAMEJAZIA, FARACI                ACCOUNT NO.:  [id number]   MEDICAL RECORD NO.:  [id number]          PATIENT TYPE:  INP   LOCATION:  A326                          FACILITY:  [facility]   PHYSICIAN:  Kassie Mends, M.D.      DATE OF BIRTH:  [birth year]   DATE OF PROCEDURE:  03/05/2007  DATE OF DISCHARGE:                               OPERATIVE REPORT   REFERRING PHYSICIAN:  Skeet Latch, DO   PRIMARY CARE PHYSICIAN:  Patrica Duel, M.D.   PROCEDURE:  Ileaocolonoscopy with cold forceps biopsy.   INDICATIONS FOR PROCEDURE:  Ms. Vanderhoff is a 25 year old female who has  had diarrhea for a year.  Over the last month, she has been treated with  several courses of Cipro and Flagyl.  Yesterday, she developed blood  associated with her diarrhea.  CT scan revealed diffuse colitis from the  cecum to the sigmoid colon.  Her stool studies are pending.   FINDINGS:  1. Patchy erythema with ulceration and mucosal sparing seen beginning      approximately 20 cm from the anal verge and extending to the      hepatic flexure.  The rectum, ascending colon, and cecum were      spared.  2. Normal terminal ileum, approximately 10 cm visualized.  3. Biopsies obtained in the colon to evaluate for inflammatory bowel      disease.  4. Normal retroflexed view of the rectum.   DIAGNOSIS:  Colitis - differential diagnosis includes resolving  infectious colitis or inflammatory bowel disease, favors Crohns disease.   RECOMMENDATIONS:  1. Will await biopsies.  Advance to a low residue lactose-free diet.  2. Add Flora-Q daily.  3. Continue Cipro and Flagyl.  4. Follow up appointment in the office on March 19, 2007.  5. Continue pain medications and antiemetics.   MEDICATIONS:  1. Demerol 125 mg IV.  2. Versed 6 mg IV.  3. Phenergan 12.5 mg IV.   PROCEDURE TECHNIQUE:  Physical exam was performed.  Informed consent was  obtained from the patient after explaining the benefits, risks, and  alternatives to the  procedure.  The patient was connected to the monitor  and placed in the left lateral position.  Continuous oxygen was provided  by nasal cannula and IV medicine administered through an indwelling  cannula.  After administration of sedation and rectal exam, the  patient's rectum was intubated,  and the scope was advanced under direct visualization to the distal  terminal ileum.  The scope was removed slowly by carefully examining the  color, texture, anatomy, and integrity of the mucosa on the way out.  The patient was recovered in endoscopy and discharged to the floor in  satisfactory condition.      Kassie Mends, M.D.  Electronically Signed     SM/MEDQ  D:  03/06/2007  T:  03/06/2007  Job:  [id number]   cc:   Patrica Duel, M.D.  Fax: [phone]

## 2010-05-29 NOTE — Consult Note (Signed)
[name], [name]                ACCOUNT NO.:  [id number]   MEDICAL RECORD NO.:  [id number]          PATIENT TYPE:  INP   LOCATION:  A326                          FACILITY:  [facility]   PHYSICIAN:  Kassie Mends, M.D.      DATE OF BIRTH:  [birth year]   DATE OF CONSULTATION:  03/06/2007  DATE OF DISCHARGE:                                 CONSULTATION   REQUESTING PHYSICIAN:  Dr. Elige Radon, [facility].   REASON FOR CONSULTATION:  1. Colitis.  2. Nausea, vomiting.  3. Bloody diarrhea.   PRIMARY CARE PHYSICIAN:  Dr. Yetta Numbers.   HISTORY OF PRESENT ILLNESS:  Patient is a 25 year old Caucasian female  who presents with a history of diarrhea for 4 to 5 weeks.  She states  symptoms began around mid January.  She is a somewhat poor historian  today.  She says she has had some diarrhea off and on before her  gallbladder surgery back in the Fall.  Her diarrhea had resolved up  until January, however. She says she just has not been 100% since her  surgery.  She had various ailments, mostly upper respiratory type  infections.  She says in January her son had a viral gastroenteritis and  got over his illness.  She developed it soon afterwards but she just has  not seemed to get over the diarrhea.  Yesterday she started passing  blood in the stools.  She saw Dr. Regino Schultze last week for headaches and he  treated her for sinusitis with another antibiotic, currently Cipro.  She  says her headaches have not improved.  She has been on a couple of  antibiotics over the course of the last several weeks.  She says once  was given some at the ER at [facility] for an injury  above her eye.  She also had been on some Advil for muscle strain in her  back.  She has had nausea, vomiting every day usually in the early  mornings.  Her pregnancy test is negative.  She has diffuse abdominal  pain at times but mostly in the lower abdomen.  Denies any dysuria or  hematuria.  She had headaches for 1  to 2 months.  Denies any heartburn,  hematemesis.   MEDICATIONS AT HOME:  1. Xanax 1 mg p.r.n.  2. Cipro 500 mg b.i.d.  3. Chantix 1 mg b.i.d.  4. Advil p.r.n.   ALLERGY:  ZITHROMAX CAUSES NAUSEA AND VOMITING.   PAST MEDICAL HISTORY:  1. She develops asthma with upper respiratory infections.  2. She does not require inhalers regularly.  3. She has anxiety, headache.   PAST SURGICAL HISTORY:  1. Appendectomy in 2004.  2. Cholecystectomy in September, 2008.   FAMILY HISTORY:  Negative for:  1. Chronic GI illnesses.  2. Colorectal cancer.  3. IBD, except her mother has IBS.   SOCIAL HISTORY:  1. She is single.  2. She lives with her boyfriend.  3. She has a 34-year-old son.  4. She works as a Lawyer at a nursing home.  5. She smokes a  few cigarettes a day and is trying to quit.  6. She denies any alcohol use.   REVIEW OF SYSTEMS:  See HPI for GI.  CONSTITUTIONAL:  No weight loss.  CARDIOPULMONARY:  No chest pain, shortness of breath.  GENITOURINARY:  See HPI.   PHYSICAL EXAM:  Temp 98.8, pulse 60, respirations 18, blood pressure  118/61, height 66 inches, weight 79.1 kg.  GENERAL:  A pleasant, obese Caucasian female in no acute distress.  Skin  warm and dry, no jaundice.  HEENT:  Sclera nonicteric.  Oropharyngeal mucosa moist and pink, no  lesions, erythema or exudate.  No lymphadenopathy, thyromegaly.  CHEST:  Lungs are clear to auscultation.  CARDIAC EXAM:  Regular rate and rhythm.  No murmurs, rubs or gallops.  ABDOMEN:  Positive bowel sounds, obese but symmetrical, soft.  No  organomegaly or masses.  Moderate lower abdominal tenderness to deep  palpation.  No rebound or guarding.  LOWER EXTREMITIES:  No edema.   LABS:  White count on admission 13,600, today is 9500.  Hemoglobin 12,  platelets 249,000, sodium 139, potassium 3.3, BUN 3, creatinine 0.61,  glucose 93, INR 1.1.  Total bilirubin 0.8, alkaline phosphatase 61, AST  19, ALT 18, albumin 4.2.  A CT of the  abdomen and pelvis, preliminary,  reveals diffuse chronic wall thickening, possible infection,  pseudomembranous colitis, UC.   IMPRESSION:  Patient is a 25 year old lady with a 4 to 5 week history of  nausea, vomiting, diarrhea.  Stools are now bloody.  CT preliminary  shows colitis.  Differential includes Clostridium difficile, given  multiple recent antibiotics and contact with nursing home patients,  other bacterial agents, irritable bowel disease.   RECOMMENDATIONS:  1. Continue IV Cipro and Flagyl for now, would switch to p.o. as able.  2. Await stool studies.  3. Possible flexible sigmoidoscopy, to discuss with Dr. Cira Servant.  4. Further recommendations to follow.   I would like to thank Dr. Elige Radon, with the [facility], for  allowing [place] to take part in the care of this patient.      Tana Coast, P.A.      Kassie Mends, M.D.  Electronically Signed    LL/MEDQ  D:  03/06/2007  T:  03/06/2007  Job:  [id number]   cc:   Kirk Ruths, M.D.  Fax: [phone]

## 2010-05-29 NOTE — Assessment & Plan Note (Signed)
NAMEMarland Kitchen  DEANDA, RUDDELL                 CHART#:  [id number]   DATE:  04/23/2007                       DOB:  [birth year]   PROBLEM LIST:  1. NSAID induced colitis.  2. Abdominal pain.  3. Anxiety.   SUBJECTIVE:  Ms. Kreiger is a 25 year old female who presents as a return  patient visit.  Her last visit was in 03/2007. She was complaining of  abdominal pain and nausea after eating.  She had a C-reactive protein,  tissue transglutaminase and quantitative immunoglobulin which were all  within normal limits.  Because of her nausea which was persistent  gastric emptying study was performed.  This showed 64% gastric retention  in 2 hours.  She was asked to start Reglan and get her labs drawn.  She  lost her Medicaid so she has not had her labs drawn yet.  She is doing  fairly well from a nausea standpoint.  She thinks the Reglan may be  causing her to have involuntary movements of her head and face.  She is  not using any Tylenol.  She rarely requires Phenergan.  She has no  particular questions, concerns or complaints.  She says BuSpar is  working good for her anxiety.   MEDICATIONS:  1. Promethazine as needed.  2. Hydrocodone as needed.  3. Hyomax less than once a day.  4. Alprazolam less than once a day.  5. Digestive enzymes.  6. BuSpar 1-1/2/7.5 mg p.o. b.i.d.   OBJECTIVE:  VITALS:  Weight 161 pounds (down 8 pounds since 03/2007),  height 5 feet 7 inches, temperature 98.1, blood pressure 102/68, pulse  16.  GENERAL:  She is no apparent distress.  Alert and orient x4.  LUNGS:  Clear to auscultation bilaterally.  CARDIOVASCULAR:  Regular rhythm, no murmur.  ABDOMEN:  Bowel sounds  present, soft, nondistended. Mild periumbilical tenderness to palpation  without rebound or guarding.   ASSESSMENT:  Ms. Engelson is a 25 year old female who presents to discuss  the diagnosis of gastroparesis.  Spent 10 minutes discussing the  diagnosis and the new medication and its side effects.  Thank you  for  allowing me to see Ms. Meyn in consultation.  My recommendations  follow.   RECOMMENDATIONS:  1. She should follow a gastroparesis diet. She was given a handout on      gastroparesis diet.  2. Discussed side effects of Reglan to include involuntary movements      of the head and extremities, drowsiness, nipple discharge, blurred      vision, and diarrhea.  She may continue the low dose of Reglan      currently at 3 x a day.  She is instructed that if the symptoms      persist, then she should stop her Reglan and call me.  Will add      erythromycin if she has side effects from Reglan.  3. Follow-up appointment in 3 months.       Kassie Mends, M.D.  Electronically Signed     SM/MEDQ  D:  04/23/2007  T:  04/23/2007  Job:  [id number]   cc:   Patrica Duel, M.D.

## 2010-05-29 NOTE — Discharge Summary (Signed)
NAMEMARLEA, GAMBILL                ACCOUNT NO.:  [id number]   MEDICAL RECORD NO.:  [id number]          PATIENT TYPE:  INP   LOCATION:  A326                          FACILITY:  [facility]   PHYSICIAN:  Skeet Latch, DO    DATE OF BIRTH:  11-23-1985   DATE OF ADMISSION:  03/05/2007  DATE OF DISCHARGE:  02/21/2009LH                               DISCHARGE SUMMARY   ADDENDUM TO DISCHARGE SUMMARY:  Please add to discharge medications  Darvocet-N 100 one tab p.o. q.4-6 h. p.r.n.      Skeet Latch, DO  Electronically Signed     SM/MEDQ  D:  03/07/2007  T:  03/08/2007  Job:  [id number]

## 2010-05-29 NOTE — H&P (Signed)
[name], KARAN                ACCOUNT NO.:  [id number]   MEDICAL RECORD NO.:  [id number]          PATIENT TYPE:  INP   LOCATION:  A326                          FACILITY:  [facility]   PHYSICIAN:  Dorris Singh, DO    DATE OF BIRTH:  06-Jul-1985   DATE OF ADMISSION:  03/05/2007  DATE OF DISCHARGE:  LH                              HISTORY & PHYSICAL   HISTORY OF PRESENT ILLNESS:  The patient is a 25 year old female who  presented to the [facility] Emergency Room complaining of abdominal  pain.  The patient was seen here with both her mother and her father.  The patient stated that the abdominal pain has been associated with  nausea, vomiting and diarrhea.  It started about a week ago which was  acute.  Since that time, she has had multiple episodes.  While she was  here in the ED, she did have an episode of rectal bleeding as well.  The  patient states too that she has been on about four courses of  antibiotics ranging from upper respiratory infections since January and  has noticed this ever-present diarrhea since that time which has gotten  worse.  It also has been recently associated with some nausea, vomiting  and diarrhea.   PAST MEDICAL HISTORY:  1. Asthma.  2. Anxiety attacks.   PAST SURGICAL HISTORY:  1. Appendectomy.  2. Cholecystectomy.   SOCIAL HISTORY:  She is a nondrinker with no drug abuse.  She currently  smokes just a few cigarettes a day.  She is currently on Chantix.  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   ALLERGIES:  ZITHROMAX which gave her nausea and vomiting.   MEDICATIONS:  1. Xanax 1 mg as needed.  2. Cipro 500 mg twice a day.  3. Chantix 1 mg tab twice a day.   REVIEW OF SYSTEMS:  CONSTITUTIONAL:  Negative for weight loss or  weakness, but positive for appetite changes.  HEENT:  Her eyes are  negative for eye pain or discharge.  Ears, nose, mouth and throat  negative for ear pain, hearing loss, hoarseness or sore throat.  CARDIOVASCULAR:  Negative for chest pain or palpitations.  RESPIRATORY:  Negative for cough, dyspnea or wheezing.  GASTROINTESTINAL:  Positive  for nausea, vomiting, diarrhea, abdominal pain, blood in stool cramps or  rectal bleeding.  GU:  Negative for dysuria, urgency or dribbling.  MUSCULOSKELETAL:  Negative for arthralgias, arthritis or back pain.  SKIN:  Negative for pruritus, rash or abrasions.  NEUROLOGIC:  Negative  for headache, confusion, altered mental status.  METABOLIC:  Negative  for extensive thirst and cold.  HEMATOLOGIC:  Negative for anemia,  bleeding or easy bruising.   PHYSICAL EXAMINATION:  VITAL SIGNS:  Blood pressure 104/60, pulse rate  88, respirations 16, temperature 97.8.  She has pulse oximetry of 898%.  GENERAL:  This is a well-developed, well-nourished, 25 year old,  Caucasian female who is in no acute distress.  HEENT:  Head is normocephalic, atraumatic.  EOMI.  PERLA.  Ears, nose,  mouth and throat  with TMs visualized bilaterally.  No scleral icterus.  Mouth with no erythema or exudate.  NECK:  Supple.  No thyromegaly or lymphadenopathy.  HEART:  Regular rate and rhythm.  No murmur, rubs or gallops.  LUNGS:  Clear to auscultation bilaterally.  No rales, wheezes or  rhonchi.  ABDOMEN:  Soft, diffuse generalized tenderness.  No masses or  hepatosplenomegaly noted.  No guarding or rebound.  Bowel sounds x4.  There was positive stool with bleeding noted.  EXTREMITIES:  Full range of motion.  No tenderness or edema.  SKIN:  Cranial nerves 2-12 grossly intact.  Her CT demonstrates diffuse  colonic wall thickening, pseudomembranous versus ulcerative colitis.   LABORATORY DATA AND X-RAY FINDINGS:  Basic metabolic panel with sodium  137, potassium 3.5, chloride 106, carbon dioxide 25, glucose 97, BUN 5,  creatinine 0.51.  Liver function tests within normal limits.  Her urine  is within normal limits.  CBC with white count 13.6, hemoglobin 14.3,  hematocrit 40.7  and a platelet count of 304.   ASSESSMENT:  1. Colitis.  2. Nausea and vomiting.  3. Abdominal pain.  4. Tobacco abuse.   PLAN:  We will admit the patient to service of [facility].  Will make her  n.p.o. until seen by GI.  Will also give her IV fluids at 125 mL per  hour.  Will obtain a.m. labs.  Also have GI come see her regarding  colitis.  Will start her on antibiotic therapy and antiemetics as well  as GI and DVT prophylaxis.  Will avoid all anticoagulants and will also  give her pain medication.      Dorris Singh, DO  Electronically Signed     CB/MEDQ  D:  03/06/2007  T:  03/06/2007  Job:  [id number]

## 2010-05-29 NOTE — Assessment & Plan Note (Signed)
NAME:  [name], [name]                 CHART#:  [id number]   DATE:  07/30/2007                       DOB:  Jul 09, 1985   REASON FOR VISIT:  Abdominal pain, nausea, and diarrhea.   SUBJECTIVE:  The patient is a 25 year old who presents today as a return  visit.  She was last seen in April for followup of NSAID-induced  colitis, abdominal pain, anxiety, and gastroparesis.  She never had her  labs that were ordered, including hemoglobin A1c, cortisol level, and  TSH.  She failed to follow up for her July 10, 2007, and July 27, 2007,  appointment.  She presents today stating that her symptoms have  returned.  She states, she started to feel like she did when she was in  the hospital with colitis.  She states she is having abdominal bloating  and discomfort.  She has pain which is in the upper abdomen and down the  right side.  She also has sharp shooting pains prior to a bowel movement  which resolves with the bowel movement.  She has 3-4 stools a day  postprandially.  She denies any nocturnal symptoms.  If she does not  eat, she usually does not have any pain or bowel movements.  She denies  any vomiting.  She does complain of daily heartburn.  Previously, did  well on Prilosec, but no longer is on the medication.  She takes a  Reglan only before meals.  If she does not eat, she does not take.  She  denies any NSAID or aspirin use.  She states her Xanax was used for  panic attacks only and they have been infrequent lately.  Denies any  fever or chills.  She states she did not tolerate Levsin due to fatigue  and sleepiness.  She denies any blood in her stool or melena.   CURRENT MEDICATIONS:  1. Xanax p.r.n., does not use daily.  2. Buspirone 7.5 mg b.i.d.  3. Reglan 5 mg q.a.c.   ALLERGIES:  Zithromax causes nausea and vomiting.   PHYSICAL EXAMINATION:  VITAL SIGNS:  Weight 167, down 2 pounds;  temperature 97.9, blood pressure 90/68, and pulse 76.  GENERAL:  A pleasant, well-nourished,  well-developed Caucasian female in  no acute distress.  SKIN:  Warm and dry.  No jaundice.  HEENT:  Sclerae nonicteric.  Oropharyngeal mucosa moist and pink.  CHEST:  Lungs are clear to auscultation.  CARDIAC:  Regular rate and rhythm.  ABDOMEN:  Positive bowel sounds.  Abdomen is soft and nondistended.  She  has mild diffuse abdominal tenderness to deep palpation.  No rebound or  guarding.  No organomegaly or masses.  No abdominal bruits or hernias.  LOWER EXTREMITIES:  No edema.   IMPRESSION:  The patient is a 25 year old lady with history of  gastroparesis, unclear etiology.  She never had a blood work which was  previously ordered when she lost her Medicaid.  I would like for her to  have this done now, including TSH and hemoglobin A1c as well as a  cortisol level.  She also has chronic abdominal pain and postprandial  loose stools with prior history of NSAID-induced colitis documented on  colonoscopy.  Suspect that she may have irritable bowel syndrome.  Previous CRP was negative.  We would like to treat her aggressively  for  irritable bowel syndrome and if she does not respond, we will consider a  further workup at that point.  Please note, she has had a CT of the  abdomen and pelvis, which was unremarkable back in March; which was  after her colonoscopy, back in February which showed the colitis.  She  also has typical reflux symptoms, not on any therapy at this time.   PLAN:  1. Trial of Bentyl 10 mg q.a.c. t.i.d. p.r.n. abdominal pain,      diarrhea, #90 one refill.  2. Prilosec 20 mg daily, #31 five refills, and #10 samples provided.  3. IBS advantage probiotics 1 daily, #30 samples.  4. Office visit in 4 weeks.  5. Follow-up above labs.       Tana Coast, P.A.  Electronically Signed     [name], M.D.  Electronically Signed    LL/MEDQ  D:  07/30/2007  T:  07/31/2007  Job:  [id number]   cc:   Patrica Duel, M.D.

## 2010-05-29 NOTE — Op Note (Signed)
[name], [name] NO.:  [id number]   MEDICAL RECORD NO.:  [id number]          PATIENT TYPE:  OBV   LOCATION:  A318                          FACILITY:  [facility]   PHYSICIAN:  Barbaraann Barthel, M.D. DATE OF BIRTH:  07-29-1985   DATE OF PROCEDURE:  09/17/2006  DATE OF DISCHARGE:                               OPERATIVE REPORT   SURGEON:  Barbaraann Barthel, M.D.   PRE-AND-POSTOPERATIVE DIAGNOSIS:  Cholecystitis, cholelithiasis   PROCEDURE:  Laparoscopic cholecystectomy.   SPECIMEN:  Gallbladder with stones.   NOTE:  This is a 25 year old white female who had recurrent right upper  quadrant pain with nausea and vomiting.  This was postprandial and  radiating to her back.  This was over approximately a 27-month period.  She had been seen in the emergency room.  I was known to the father, as  he was a patient of mine; and this patient was self-referred.   Sonogram revealed cholecystitis and cholelithiasis.  Liver function  studies were grossly within normal limits.   We discussed the need for surgery, in detail, with the patient;  discussing complications not limited to, but including bleeding,  infection, damage to bile ducts, perforation of organs, transitory  diarrhea; and the possibility that open surgery might be required.  Informed consent was obtained.   GROSS OPERATIVE FINDINGS:  The patient had fatty infiltration of the  Hartmann's pouch of the gallbladder, stones within the gallbladder, and  a moderate amount of adhesions about the gallbladder; and a small cystic  duct which was not cannulated for cholangiogram.  The rest of the right  upper quadrant appeared to be normal.  The patient had previous  laparoscopic appendectomy so there were a lot of adhesions about the  umbilicus; that is why we entered the abdomen in the epigastrium to  avoid this, so that there would not be any less possibility of  perforating any bowel through prior adhesions.   TECHNIQUE:  The patient was placed in the supine position.  After the  adequate administration of general anesthesia via endotracheal  intubation, a Foley catheter was aseptically inserted; and the patient's  abdomen was prepped with Betadine solution and draped in the usual  manner.   Incision was carried out on the epigastrium through skin, subcutaneous  tissue.  I grasped the fascia, and then placed the Veress needle within  this and insufflated.  I needed to readjust the needle a couple of times  in order to get good flow; and make sure that we did not have any  problems.  This was, each time, confirmed in position with the saline  drop test.   We then placed an 11-mm cannula using the Visiport technique in the  epigastrium; and then under direct vision I made an incision just above  the umbilicus; and placed an 11-mm cannula there avoiding those  adhesions from her previous laparoscopic surgery.  Then, under direct  vision, two other 5-mm cannulas were placed in the right upper quadrant  laterally.  The gallbladder was grasped; its adhesions were taken down.  The cystic duct and  cystic artery were clamped together as they were  very close and triply silver clipped and divided.  Then we removed the  gallbladder using the hook cautery device from the liver bed; this was  accomplished without any spillage.  We then irrigated with normal saline  solution; and cauterized the liver bed.  I elected to leave a piece of  Surgicel within the liver bed and the Jackson-Pratt drain which exited  through one of the 5-mm cannula sites.   After checking for hemostasis, the abdomen was desufflated; and I used  1/2% Sensorcaine in all port sites to help with postoperative comfort.  I then closed the wounds with a stapling device.  Prior to closure all  sponge, needle, and instrument counts were found to be correct.  Estimated blood loss was minimal.  The patient received 900 mL of  crystalloids  intraoperatively.  The Jackson-Pratt drain was sutured into  place with 3-0 nylon.  A sterile dressing was applied.  There were no  complications.      Barbaraann Barthel, M.D.  Electronically Signed     WB/MEDQ  D:  09/17/2006  T:  09/17/2006  Job:  [id number]   cc:   Patrica Duel, M.D.  Fax: [phone]

## 2010-05-29 NOTE — Discharge Summary (Signed)
[name], [name]                ACCOUNT NO.:  [id number]   MEDICAL RECORD NO.:  [id number]          PATIENT TYPE:  INP   LOCATION:  A326                          FACILITY:  [facility]   PHYSICIAN:  Skeet Latch, DO    DATE OF BIRTH:  Mar 04, 1985   DATE OF ADMISSION:  03/05/2007  DATE OF DISCHARGE:  02/21/2009LH                               DISCHARGE SUMMARY   ADMITTING DIAGNOSES:  1. Colitis.  2. Nausea and vomiting.  3. Abdominal pain.  4. Tobacco abuse.   DISCHARGE DIAGNOSES:  1. Colitis, inflammatory versus Crohn's.  2. Abdominal pain, now resolved.  3. History of tobacco abuse.   BRIEF HOSPITAL COURSE:  This is a 25 year old female who presented to  [facility] ER complaining of abdominal pain.  The patient's abdominal  pain was associated with nausea, vomiting and diarrhea that started  approximately 1 week ago.  Since that time she has had multiple  episodes.  She was seen in the emergency room and had an episode of  rectal bleeding.  The patient was placed on 4 doses of antibiotics  arranged from upper respiratory infection since January, and notes she  has had ever-present diarrhea since that time and it has gotten worse.  The patient was seen in the emergency room and on her CT scan she was  found to have colitis.  Secondary to this, Gastroenterology was  consulted.  They performed an ileocolonoscopy and found patchy erythema  with ulceration beginning approximately 20 cm from the anal verge and  extending to the hepatic flexure.  The rectum, descending colon and  cecum were spared.  Biopsies were obtained.  Secondary to this, the  patient was placed on a low-residue lactose-free diet, Flora-Q daily and  patient was continued on IV Cipro and Flagyl.  Patient's pain has  improved with IV pain medication and with IV antibiotics and at this  time Gastroenterology feels that the patient can be discharged to home.   She will be sent home on the following medications:  1.  She can continue her Xanax 1 mg as needed and her Chantix 1 mg      twice a day.  2. Cipro 500 mg twice daily for 10 days.  3. Flagyl 500 mg three times a day for 10 days.  4. Prilosec 20 mg daily.   VITALS ON DISCHARGE:  Temperature is 97.9, pulse 58, respirations 20,  blood pressure 105/71, she is sating 95% on room air.   LABS ON DISCHARGE:  Her C. difficile was negative.  Sodium 139,  potassium 3.3, chloride 109, CO2 is 26, glucose 93, BUN 3, creatinine  0.61.  PTT is 31, PT 14.1, INR is 1.1, white count is 9.5, hemoglobin  12, hematocrit is 34.3, platelets 249.   CONSULTANT:  Gastroenterology, Dr. Cira Servant.   CONDITION AT DISCHARGE:  Stable.   DISPOSITION:  The patient will be discharged to home.   DISCHARGE INSTRUCTIONS:  1. The patient is to maintain a low residue lactose-free diet until      seen by Dr. Cira Servant on March 19, 2007.  2.  Patient is to resume activity slowly.  3. Patient does not have a primary care physician.  After her      appointment with Gastroenterology, patient probably needs to have a      primary care physician.  4. Patient may return to work on March 09, 2007, and has been given      instructions on a lactose-free, low-lactose diet.      Skeet Latch, DO  Electronically Signed     SM/MEDQ  D:  03/07/2007  T:  03/08/2007  Job:  [id number]

## 2010-05-29 NOTE — Assessment & Plan Note (Signed)
NAMEMarland Kitchen  ANASIA, AGRO                 CHART#:  [id number]   DATE:  03/19/2007                       DOB:  07-Jul-1985   DATE OF VISIT:  03/19/2007   REFERRING PHYSICIAN:  Patrica Duel, M.D.   PROBLEM LIST:  1. NSAID colitis.  2. Abdominal pain.  3. Anxiety.   SUBJECTIVE:  The patient is a 25 year old female who presents as a  return patient visit.  She called me on the 3rd of March complaining of  abdominal pain and vomiting.  She was asked to begin Levsin and to  continue to use Phenergan.  Her left-sided abdominal pain is gone today.  She states she has lower abdominal pain.  Her last menstrual period was  February 24, 2007 and it should be beginning any day now.  Her bowel  movements ae soft.  She said she had four to five yesterday.  Her  appetite has not been good, because when she eats she gets abdominal  pain.  The last time she ate a plain hamburger and got abdominal pain,  which she felt all over her abdomen.  She denies any fever or vomiting.  Her nausea is off and on and treated successfully with Phenergan.  She  denies any blood in her stool, black, tarry stools.  She does smoke.  She is using three to four HyoMax a day.  She is not on any birth  control pills.  She uses condoms for birth control.   MEDICATIONS:  1. Cipro 500 mg b.i.d.  2. Phenergan 12.5 mg as needed.  3. Hydrocodone 5/325 as needed.  4. Metronidazole has been completed.  5. HyoMax as needed.  6. Alprazolam as needed.  7. Digestive Advantage 2 in the morning.   OBJECTIVE:  Weight 169 pounds.  Height 5 feet 7 inches.  BMI 26.5  (slightly overweight).  Temperature 98.  Blood pressure is 92/70.  Pulse  is 72.  GENERAL:  She is in no apparent distress.  Alert and oriented x4.LUNGS:  Clear to auscultation bilaterally.CARDIOVASCULAR:  Regular rhythm, no  murmur.  Normal S1, S2.ABDOMEN:  Bowel sounds are present, soft,  nondistended, slightly obese.  Mild tenderness to palpation in the  suprapubic  region without rebound or guarding.  NEURO:  No focal neurologic deficits.   ASSESSMENT:  The patient is a 26 year old female who complains of  postprandial abdominal pain, soft stool and nausea.  She is continuing  her antibiotic therapy.  She was on Cipro without Flagyl.  The  differential diagnosis includes irritable bowel syndrome and a low  likelihood of inflammatory bowel disease or celiac sprue.  Thank you for  allowing me to see this patient in consultation.  My recommendations  follow.   RECOMMENDATIONS:  1. We will order a C-reactive protein, quantitative immunoglobulin and      tissue transglutaminase IgA today.  2. She is given a refill on Phenergan and hydrocodone.  She knows she      should begin a transition from  Tylenol with a narcotic to plain      Tylenol.  I did warn her not to use more that 8 hydrocodone or      Tylenol Extra-Strength or Tylenol Arthritis per day.  3. She is to add Benefiber and she is given samples.  4. She may now proceed  with a high fiber diet, but continue a lactose      free diet.  She is given a handout on high fiber diet.  5. She should begin Levsin 1-2 sublingual 30 minutes before meals and      may repeat that every 4 hours, but no more than 8 pills per day.      She is given discharge instructions in writing.  6. She has a follow up visit with me in one month.   ADDENDUM:  CRP, qIg, TTG all WNLs.       Kassie Mends, M.D.  Electronically Signed     SM/MEDQ  D:  03/19/2007  T:  03/19/2007  Job:  [id number]   cc:   Patrica Duel, M.D.

## 2010-05-29 NOTE — Telephone Encounter (Signed)
NAMEMarland Kitchen  Tammy, [name]                 CHART#:  [id number]   DATE:  07/06/2007                       DOB:  01-30-1985   I received 4 phone calls from [name] over the weekend.  She  initially called on Saturday, 06/03/2007, with complaints of lower  abdominal cramp-like pain.  She has a history of gastroparesis, NSAID-  induced colitis, abdominal pain, and anxiety.  She takes Reglan for her  gastroparesis.  She was on her way to work and developed some mild low-  abdominal cramping without fever, chills, nausea, vomiting, change in  bowel habits, or diaphoresis.  I did call in Levsin 0.125 mg a.c. and  nightly up to q.i.d., #90 with 1 refill to her pharmacy.  She did call  me back Sunday, the following day, noting that she needed hydrocodone  for her pain.  I did tell her that we do not give this over the phone  and that she would need to be seen directly either via the ER or an  appointment through our office tomorrow depending on how much pain she  was having.  She was agreeable to this and then later on did receive  another phone call from her noting that she needed the prescription  recall to [facility] given the fact that the pharmacy I had initially  called the prescription had closed for the day.  I did call her the same  prescription and again to [facility].  She called back another  time and she was asked to schedule an office visit or again go to the  emergency room if she continued to have severe pain.      Lorenza Burton, N.P.  Electronically Signed     R. Roetta Sessions, M.D.  Electronically Signed    KJ/MEDQ  D:  07/06/2007  T:  07/06/2007  Job:  [id number]   cc:   Patrica Duel, M.D.

## 2010-06-01 NOTE — Op Note (Signed)
   NAME:  [name], [name]                          ACCOUNT NO.:  [id number]   MEDICAL RECORD NO.:  [id number]                   PATIENT TYPE:  INP   LOCATION:  A428                                 FACILITY:  [facility]   PHYSICIAN:  Dirk Dress. Katrinka Blazing, M.D.                DATE OF BIRTH:  1985/03/18   DATE OF PROCEDURE:  DATE OF DISCHARGE:                                 OPERATIVE REPORT   PREOPERATIVE DIAGNOSIS:  Acute appendicitis.   POSTOPERATIVE DIAGNOSIS:  Acute appendicitis.   OPERATION/PROCEDURE:  Laparoscopic appendectomy.   DESCRIPTION OF PROCEDURE:  Under general anesthesia, the patient's abdomen  was prepped and draped in a sterile field.  The supraumbilical incision was  made after excision of a supraumbilical piercing site.  The Veress needle  was inserted uneventfully.  Using a Visiport guide, a 10 mm port was placed  uneventfully.  The laparoscope was placed.  The appendix was visualized.  Under videoscopic guidance, a 5 mm port was placed in the suprapubic midline  and a 12 mm port was placed in the left lower quadrant.  The appendix was  grasped.  There were many adhesions to the body of the appendix.  The  appendix medial tip was inflamed and thickened.  Using blunt dissection, the  mesoappendix was dissected, clipped with multiple clips and divided.  This  was continued down to the base of the appendix.  The base of the appendix  was transected using a standard endo-GIA.  The appendiceal stump showed no  evidence of bleeding.  The appendix was placed in an endo-catch device and  removed uneventfully.  Irrigation with 2 L of saline was carried out.  There  was no bleeding and minimal drainage.  She had a small cyst of the right  ovary.  A picture of this was taken and given to the patient.  She tolerated  the procedure well.  CO2 was allowed to escape from the abdomen and the  ports were removed.  The incisions were closed using 0 Dexon on the fascia  of the larger incisions  and staples on the skin.  The patient tolerated the  procedure well.  A sterile dressing was placed.  She was awakened from  anesthesia, transferred to a bed and taken to the postanesthetic care unit.                                                Dirk Dress. Katrinka Blazing, M.D.    LCS/MEDQ  D:  05/31/2002  T:  05/31/2002  Job:  [id number]   cc:   Patrica Duel, M.D.  [address], [address]  [place]  [place] [address]  Fax: [phone]

## 2010-06-01 NOTE — Discharge Summary (Signed)
   NAME:  [name], [name]                          ACCOUNT NO.:  [id number]   MEDICAL RECORD NO.:  [id number]                   PATIENT TYPE:  INP   LOCATION:  A428                                 FACILITY:  [facility]   PHYSICIAN:  Dirk Dress. Katrinka Blazing, M.D.                DATE OF BIRTH:  1985-11-24   DATE OF ADMISSION:  05/30/2002  DATE OF DISCHARGE:  06/01/2002                                 DISCHARGE SUMMARY   DISCHARGE DIAGNOSIS:  Acute appendicitis.   PROCEDURE:  Laparoscopic appendectomy.   DISPOSITION:  The patient is discharged home in stable and satisfactory  condition.   DISCHARGE MEDICATIONS:  Tylox 1-2 q.4h. as needed.   The patient was scheduled to be seen in the office on June 2.   SUMMARY:  A 25 year old female with a history of onset of diffuse abdominal  pain about 1 p.m. on the day of admission.  The pain was localized to the  right lower quadrant with nausea and vomiting. She was seen in the emergency  room with pain in the abdomen which revealed acute appendicitis.   PAST HISTORY:  Unremarkable.   PHYSICAL EXAMINATION:  Unremarkable except for tenderness in the right lower  quadrant.   LABORATORY DATA:  Revealed white count 13,200, hemoglobin 13.2, hematocrit  38.5, liver functions were negative.  Urine pregnancy test was negative.  Urinalysis was negative.   The patient was admitted and underwent laparoscopic appendectomy  uneventfully.  She had an uneventful postoperative course and was discharged  home on the morning of the first postoperative day in satisfactory  condition.                                               Dirk Dress. Katrinka Blazing, M.D.    LCS/MEDQ  D:  07/20/2002  T:  07/21/2002  Job:  [id number]

## 2010-07-09 ENCOUNTER — Other Ambulatory Visit (HOSPITAL_COMMUNITY): Payer: Self-pay | Admitting: Anesthesiology

## 2010-07-09 DIAGNOSIS — M549 Dorsalgia, unspecified: Secondary | ICD-10-CM

## 2010-07-11 ENCOUNTER — Ambulatory Visit (HOSPITAL_COMMUNITY): Admission: RE | Admit: 2010-07-11 | Payer: BC Managed Care – PPO | Source: Ambulatory Visit

## 2010-07-20 ENCOUNTER — Ambulatory Visit (HOSPITAL_COMMUNITY)
Admission: RE | Admit: 2010-07-20 | Discharge: 2010-07-20 | Disposition: A | Discharge status: Home / Self Care | Payer: BC Managed Care – PPO | Source: Ambulatory Visit | Attending: Anesthesiology | Admitting: Anesthesiology

## 2010-07-20 DIAGNOSIS — M545 Low back pain, unspecified: Secondary | ICD-10-CM | POA: Insufficient documentation

## 2010-07-20 DIAGNOSIS — M5126 Other intervertebral disc displacement, lumbar region: Secondary | ICD-10-CM | POA: Insufficient documentation

## 2010-07-20 DIAGNOSIS — M549 Dorsalgia, unspecified: Secondary | ICD-10-CM

## 2010-10-05 LAB — BASIC METABOLIC PANEL
BUN: 5 — ABNORMAL LOW
CO2: 25
CO2: 26
Calcium: 8.7
Calcium: 9.3
Chloride: 106
Creatinine, Ser: 0.51
Creatinine, Ser: 0.61
GFR calc Af Amer: >60

## 2010-10-05 LAB — DIFFERENTIAL
Basophils Absolute: 0
Basophils Relative: 0
Eosinophils Absolute: 0
Lymphocytes Relative: 41
Lymphs Abs: 3.9
Neutro Abs: 4.7
Neutrophils Relative %: 50
Neutrophils Relative %: 83 — ABNORMAL HIGH

## 2010-10-05 LAB — CLOSTRIDIUM DIFFICILE EIA

## 2010-10-05 LAB — STOOL CULTURE

## 2010-10-05 LAB — HEPATIC FUNCTION PANEL
Albumin: 4.2
Total Protein: 7

## 2010-10-05 LAB — CBC
HCT: 34.3 — ABNORMAL LOW
MCHC: 35.1
MCV: 94.3
Platelets: 249
Platelets: 304
RDW: 11.8
WBC: 13.6 — ABNORMAL HIGH
WBC: 9.5

## 2010-10-05 LAB — PROTIME-INR
INR: 1.1
Prothrombin Time: 14.1

## 2010-10-05 LAB — URINALYSIS, ROUTINE W REFLEX MICROSCOPIC
Bilirubin Urine: NEGATIVE
Glucose, UA: NEGATIVE
Hgb urine dipstick: NEGATIVE
Protein, ur: NEGATIVE

## 2010-10-05 LAB — OVA AND PARASITE EXAMINATION

## 2010-10-05 LAB — APTT: aPTT: 31

## 2010-10-16 LAB — URINALYSIS, ROUTINE W REFLEX MICROSCOPIC
Ketones, ur: NEGATIVE
Nitrite: POSITIVE — AB
Protein, ur: >300 — AB

## 2010-10-16 LAB — URINE MICROSCOPIC-ADD ON

## 2010-10-16 LAB — PREGNANCY, URINE: Preg Test, Ur: NEGATIVE

## 2010-10-23 LAB — GC/CHLAMYDIA PROBE AMP, GENITAL
Chlamydia, DNA Probe: NEGATIVE
GC Probe Amp, Genital: NEGATIVE

## 2010-10-23 LAB — URINALYSIS, ROUTINE W REFLEX MICROSCOPIC
Glucose, UA: NEGATIVE
Hgb urine dipstick: NEGATIVE
Protein, ur: NEGATIVE
Specific Gravity, Urine: 1.02
Urobilinogen, UA: 0.2

## 2010-10-23 LAB — WET PREP, GENITAL: Trich, Wet Prep: NONE SEEN

## 2010-10-26 LAB — HEPATIC FUNCTION PANEL
ALT: 44 — ABNORMAL HIGH
ALT: 46 — ABNORMAL HIGH
AST: 41 — ABNORMAL HIGH
Albumin: 3.8
Alkaline Phosphatase: 69
Bilirubin, Direct: 0.2
Bilirubin, Direct: 0.2
Indirect Bilirubin: 0.6
Indirect Bilirubin: 0.6
Total Bilirubin: 0.8
Total Protein: 5.4 — ABNORMAL LOW
Total Protein: 6.1

## 2010-10-26 LAB — CBC
HCT: 33.9 — ABNORMAL LOW
HCT: 41
Hemoglobin: 11.7 — ABNORMAL LOW
Hemoglobin: 14.3
MCHC: 34.6
MCHC: 34.8
MCV: 91.1
MCV: 90.7
Platelets: 218
Platelets: 283
RBC: 3.72 — ABNORMAL LOW
RBC: 4.53
RDW: 12
RDW: 12.5
WBC: 9.5
WBC: 7

## 2010-10-26 LAB — BASIC METABOLIC PANEL
BUN: 5 — ABNORMAL LOW
BUN: 9
CO2: 24
CO2: 24
Calcium: 8.7
Calcium: 9.2
Chloride: 114 — ABNORMAL HIGH
Chloride: 110
Creatinine, Ser: 0.63
Creatinine, Ser: 0.55
GFR calc Af Amer: >60
GFR calc Af Amer: >60
GFR calc non Af Amer: >60
GFR calc non Af Amer: >60
Glucose, Bld: 101 — ABNORMAL HIGH
Glucose, Bld: 75
Potassium: 4.1
Potassium: 3.9
Sodium: 142
Sodium: 138

## 2010-10-26 LAB — DIFFERENTIAL
Basophils Absolute: 0
Basophils Absolute: 0
Basophils Relative: 0
Basophils Relative: 0
Eosinophils Absolute: 0.1
Eosinophils Absolute: 0.1
Eosinophils Relative: 1
Eosinophils Relative: 1
Lymphocytes Relative: 32
Lymphs Abs: 2.2
Monocytes Absolute: 0.7
Monocytes Absolute: 0.5
Monocytes Relative: 7
Neutro Abs: 4.2
Neutrophils Relative %: 60

## 2010-10-26 LAB — HCG, QUANTITATIVE, PREGNANCY: hCG, Beta Chain, Quant, S: <2

## 2010-10-26 LAB — AMYLASE: Amylase: 43

## 2010-10-29 LAB — DIFFERENTIAL
Basophils Absolute: 0
Lymphocytes Relative: 18
Monocytes Absolute: 0.7
Monocytes Relative: 7
Neutro Abs: 6.7
Neutrophils Relative %: 74

## 2010-10-29 LAB — COMPREHENSIVE METABOLIC PANEL
Albumin: 3.8
Alkaline Phosphatase: 52
BUN: 8
Chloride: 111
Glucose, Bld: 101 — ABNORMAL HIGH
Potassium: 3.5
Total Bilirubin: 0.8

## 2010-10-29 LAB — CBC
HCT: 40.7
Hemoglobin: 14
WBC: 9.1

## 2010-10-29 LAB — WET PREP, GENITAL
Clue Cells Wet Prep HPF POC: NONE SEEN
Trich, Wet Prep: NONE SEEN
Yeast Wet Prep HPF POC: NONE SEEN

## 2010-10-29 LAB — URINALYSIS, ROUTINE W REFLEX MICROSCOPIC
Glucose, UA: NEGATIVE
pH: 6.5

## 2010-10-29 LAB — GC/CHLAMYDIA PROBE AMP, GENITAL
Chlamydia, DNA Probe: NEGATIVE
GC Probe Amp, Genital: NEGATIVE

## 2011-07-29 ENCOUNTER — Encounter: Payer: Self-pay | Admitting: Internal Medicine

## 2011-08-26 ENCOUNTER — Ambulatory Visit (INDEPENDENT_AMBULATORY_CARE_PROVIDER_SITE_OTHER): Payer: PRIVATE HEALTH INSURANCE | Admitting: Gastroenterology

## 2011-08-26 ENCOUNTER — Encounter: Payer: Self-pay | Admitting: Gastroenterology

## 2011-08-26 VITALS — BP 124/72 | HR 88 | Ht 69.0 in | Wt 194.0 lb

## 2011-08-26 DIAGNOSIS — R634 Abnormal weight loss: Secondary | ICD-10-CM

## 2011-08-26 DIAGNOSIS — R197 Diarrhea, unspecified: Secondary | ICD-10-CM

## 2011-08-26 DIAGNOSIS — R1110 Vomiting, unspecified: Secondary | ICD-10-CM

## 2011-08-26 MED ORDER — METOCLOPRAMIDE HCL 10 MG PO TABS: 10.0000 mg | ORAL_TABLET | Freq: Every day | ORAL | Status: DC

## 2011-08-26 MED ORDER — MOVIPREP 100 G PO SOLR: 1.0000 | ORAL | Status: DC

## 2011-08-26 NOTE — Progress Notes (Signed)
HPI: This is a    very pleasant 26 year old woman who is here with her grandmother today.  She was a long-term patient of Dr. Kassie Mends at [facility].  I see reports dating back to 2009 stating she has underlying gastroparesis. Indeed a gastric emptying scan done in 2009 does confirm slow gastric emptying. She had a remote cholecystectomy. She had an abdominal ultrasound in 2012 suggests dilated bile duct up to 14 mm. I am not sure if this correlates with elevated liver tests at the same time.   EGD in 2011 reviewed: was normal\ except for mild erythema in the antrum. Biopsies were taken and showed no infection, celiac sprue biopsies were done and they were normal.  Colonoscopy 2009. She had some ulceration 20 cm from her anal verge extending to the hepatic flexure, biopsies were taken and suggested "ischemic colitis"  She has nausea "24/7", vomiting several times a day.  Can have dry heaves.  She believes she has IBS, and knows her stomach empties slowly.  She believes she has fibromyalgia.  She sees pain clinic.  Pain  She wants to switch care.  Grandmothers sister had stomach cancer.  Grandmother has [place].  She has been on cymbalta for over a year.  Started butran patch, then GI upset started in earnest.  Overall she has lost 25 pounds in 1-2 months.  Non etoh.  She drinks a lot of caffeine (used to be mt dews, now pepsi 60 ounces a day at least).  She eats 2-3 meals a day.  Small lately.     Review of systems: Pertinent positive and negative review of systems were noted in the above HPI section. Complete review of systems was performed and was otherwise normal.    Past Medical History  Diagnosis Date  . Other dysphagia   . Esophageal reflux   . Irritable bowel syndrome   . Hyperemesis gravidarum with metabolic disturbance, unspecified as to episode of care   . Anemia, unspecified   . Unspecified constipation   . Headache   . Unspecified asthma   . Gastroparesis   .  Anxiety state, unspecified   . Other and unspecified noninfectious gastroenteritis and colitis   . Nausea alone   . Abdominal pain, unspecified site     Past Surgical History  Procedure Date  . Cholecystectomy   . Appendectomy     Current Outpatient Prescriptions  Medication Sig Dispense Refill  . cholecalciferol (VITAMIN D) 1000 UNITS tablet Take 1,000 Units by mouth daily.      . DULoxetine (CYMBALTA) 60 MG capsule Take 60 mg by mouth daily.      . pregabalin (LYRICA) 100 MG capsule Take 100 mg by mouth 3 (three) times daily.        Allergies as of 08/26/2011 - Review Complete 08/26/2011  Allergen Reaction Noted  . Azithromycin    . Penicillins    . Prednisone      Family History  Problem Relation Age of Onset  . Colon cancer      PGGM  . Ulcerative colitis Paternal Grandmother   . Colon polyps Paternal Grandfather   . Stomach cancer      Paternal Great Aunt   . Stomach cancer Cousin     Maternal Side     History   Social History  . Marital Status: Single    Spouse Name: N/A    Number of Children: 2  . Years of Education: N/A   Occupational History  .  CNA    Social History Main Topics  . Smoking status: Current Everyday Smoker  . Smokeless tobacco: Never Used  . Alcohol Use: No  . Drug Use: No  . Sexually Active: Not on file   Other Topics Concern  . Not on file   Social History Narrative   Daily caffeine        Physical Exam: BP 124/72  Pulse 88  Ht 5\' 9"  (1.753 m)  Wt 194 lb (87.998 kg)  BMI 28.65 kg/m2 Constitutional: generally well-appearing Psychiatric: alert and oriented x3 Eyes: extraocular movements intact Mouth: oral pharynx moist, no lesions Neck: supple no lymphadenopathy Cardiovascular: heart regular rate and rhythm Lungs: clear to auscultation bilaterally Abdomen: soft, nontender, nondistended, no obvious ascites, no peritoneal signs, normal bowel sounds Extremities: no lower extremity edema bilaterally Skin: no lesions  on visible extremities    Assessment and plan: 26 y.o. 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 Colonoscopy was a look in the normal terminal ileum in 2009 suggested some inflammation of her: The biopsies stated question ischemic colitis. That seems like it would be unusual for a young woman like this. She does have chronic loose stools. Her biggest complaint however is that of nausea, intermittent vomiting. This was a chronic issue for her made much worse when she started some type of a narcotic pain patch for her pain clinic for her fibromyalgia pains. I think any narcotic medicines will likely contribute to her nausea, GI upset. Cymbalta , the #1 side effect is also nausea. She also has well-documented gastroparesis. I think we should proceed with EGD and colonoscopy to once again define her disease, check for gastritis, peptic ulcer disease, significant GERD damage. I would like to also look at her colon Due to this history of "ischemic colitis but this does not make sense.  Best with propofol./

## 2011-08-26 NOTE — Patient Instructions (Addendum)
One of your biggest health concerns is your smoking.  This increases your risk for most cancers and serious cardiovascular diseases such as strokes, heart attacks.  You should try your best to stop.  If you need assistance, please contact your PCP or Smoking Cessation Class at [facility] [phone]) or [facility] Quit-Line (1-800-QUIT-NOW). #1 side effect of Cymbalta is nausea, this could obviously be contributing to some of your problems. We will get records from Dr. Kassie Mends' (now [facility] tests including a colonoscopy that she thinks she had done with Dr. Cira Servant. You will be set up for an upper endoscopy with MAC sedation for nausea. You will be set up for a colonoscopy at same time for ?colitis on 2009 colonsocopy You should be eating smaller, more frequent meals (4-5 times a day rather 2-3 meals a day). Any narcotic pain medicines will likely cause nausea.   Start reglan again, one pill at bedtime every night to help your morning nausea. A copy of this information will be made available to [facility], Linde Gillis.

## 2011-08-28 ENCOUNTER — Encounter: Payer: Self-pay | Admitting: Gastroenterology

## 2011-09-06 ENCOUNTER — Ambulatory Visit (AMBULATORY_SURGERY_CENTER): Payer: PRIVATE HEALTH INSURANCE | Admitting: Gastroenterology

## 2011-09-06 ENCOUNTER — Encounter: Payer: Self-pay | Admitting: Gastroenterology

## 2011-09-06 VITALS — BP 97/63 | HR 67 | Temp 98.6°F | Resp 20 | Ht 69.0 in | Wt 194.0 lb

## 2011-09-06 DIAGNOSIS — R197 Diarrhea, unspecified: Secondary | ICD-10-CM

## 2011-09-06 DIAGNOSIS — R634 Abnormal weight loss: Secondary | ICD-10-CM

## 2011-09-06 DIAGNOSIS — K2990 Gastroduodenitis, unspecified, without bleeding: Secondary | ICD-10-CM

## 2011-09-06 DIAGNOSIS — R1110 Vomiting, unspecified: Secondary | ICD-10-CM

## 2011-09-06 DIAGNOSIS — K2970 Gastritis, unspecified, without bleeding: Secondary | ICD-10-CM

## 2011-09-06 MED ORDER — SODIUM CHLORIDE 0.9 % IV SOLN: 500.0000 mL | INTRAVENOUS | Status: DC

## 2011-09-06 NOTE — Patient Instructions (Addendum)
Discharge instructions given with verbal understanding. Handout on gastritis given. Resume previous medications. YOU HAD AN ENDOSCOPIC PROCEDURE TODAY AT THE [facility]: Refer to the procedure report that was given to you for any specific questions about what was found during the examination.  If the procedure report does not answer your questions, please call your gastroenterologist to clarify.  If you requested that your care partner not be given the details of your procedure findings, then the procedure report has been included in a sealed envelope for you to review at your convenience later.  YOU SHOULD EXPECT: Some feelings of bloating in the abdomen. Passage of more gas than usual.  Walking can help get rid of the air that was put into your GI tract during the procedure and reduce the bloating. If you had a lower endoscopy (such as a colonoscopy or flexible sigmoidoscopy) you may notice spotting of blood in your stool or on the toilet paper. If you underwent a bowel prep for your procedure, then you may not have a normal bowel movement for a few days.  DIET: Your first meal following the procedure should be a light meal and then it is ok to progress to your normal diet.  A half-sandwich or bowl of soup is an example of a good first meal.  Heavy or fried foods are harder to digest and may make you feel nauseous or bloated.  Likewise meals heavy in dairy and vegetables can cause extra gas to form and this can also increase the bloating.  Drink plenty of fluids but you should avoid alcoholic beverages for 24 hours.  ACTIVITY: Your care partner should take you home directly after the procedure.  You should plan to take it easy, moving slowly for the rest of the day.  You can resume normal activity the day after the procedure however you should NOT DRIVE or use heavy machinery for 24 hours (because of the sedation medicines used during the test).    SYMPTOMS TO REPORT IMMEDIATELY: A  gastroenterologist can be reached at any hour.  During normal business hours, 8:30 AM to 5:00 PM Monday through Friday, call [phone].  After hours and on weekends, please call the GI answering service at [phone] who will take a message and have the physician on call contact you.   Following lower endoscopy (colonoscopy or flexible sigmoidoscopy):  Excessive amounts of blood in the stool  Significant tenderness or worsening of abdominal pains  Swelling of the abdomen that is new, acute  Fever of 100F or higher  Following upper endoscopy (EGD)  Vomiting of blood or coffee ground material  New chest pain or pain under the shoulder blades  Painful or persistently difficult swallowing  New shortness of breath  Fever of 100F or higher  Black, tarry-looking stools  FOLLOW UP: If any biopsies were taken you will be contacted by phone or by letter within the next 1-3 weeks.  Call your gastroenterologist if you have not heard about the biopsies in 3 weeks.  Our staff will call the home number listed on your records the next business day following your procedure to check on you and address any questions or concerns that you may have at that time regarding the information given to you following your procedure. This is a courtesy call and so if there is no answer at the home number and we have not heard from you through the emergency physician on call, we will assume that you have returned   to your regular daily activities without incident.  SIGNATURES/CONFIDENTIALITY: You and/or your care partner have signed paperwork which will be entered into your electronic medical record.  These signatures attest to the fact that that the information above on your After Visit Summary has been reviewed and is understood.  Full responsibility of the confidentiality of this discharge information lies with you and/or your care-partner.  

## 2011-09-06 NOTE — Progress Notes (Signed)
Patient did not experience any of the following events: a burn prior to discharge; a fall within the facility; wrong site/side/patient/procedure/implant event; or a hospital transfer or hospital admission upon discharge from the facility. (G8907) Patient did not have preoperative order for IV antibiotic SSI prophylaxis. (G8918)  

## 2011-09-06 NOTE — Progress Notes (Signed)
Propofol given and oxygen managed per L Beeson CRNA 

## 2011-09-06 NOTE — Op Note (Signed)
[facility] [address] [place], [address]   ENDOSCOPY PROCEDURE REPORT  PATIENT: [name], [name]  MR#: [id number] BIRTHDATE: 06/18/1985 , 25  yrs. old GENDER: Female ENDOSCOPIST: Rachael Fee, MD PROCEDURE DATE:  09/06/2011 PROCEDURE:  EGD w/ biopsy ASA CLASS:     Class II INDICATIONS:  chronic nausea. MEDICATIONS: MAC sedation, administered by CRNA and propofol (Diprivan) 100mg  IV TOPICAL ANESTHETIC: none  DESCRIPTION OF PROCEDURE: After the risks benefits and alternatives of the procedure were thoroughly explained, informed consent was obtained.  The LB GIF-H180 [id number] endoscope was introduced through the mouth and advanced to the second portion of the duodenum. Without limitations.  The instrument was slowly withdrawn as the mucosa was fully examined.     There was mild gastritis (inflammation) was found in the entire examined stomach.  A biopsy was performed using cold forceps. Sample sent for histology.  The remainder of the upper endoscopy exam was otherwise normal.  Retroflexed views revealed no abnormalities.     The scope was then withdrawn from the patient and the procedure completed.  COMPLICATIONS: There were no complications.  ENDOSCOPIC IMPRESSION: 1.   Mild gastritis (inflammation) was found in the entire examined stomach; biopsy performed 2.   The remainder of the upper endoscopy exam was otherwise normal  RECOMMENDATIONS: await biopsy results   eSigned:  Rachael Fee, MD 09/06/2011 9:40 AM

## 2011-09-06 NOTE — Op Note (Signed)
[facility] [address] [place], [address]   COLONOSCOPY PROCEDURE REPORT  PATIENT: Tammy, [name]  MR#: [id number] BIRTHDATE: 1985-06-30 , 25  yrs. old GENDER: Female ENDOSCOPIST: Rachael Fee, MD REFERRED XB:JYNWG Gerda Diss, M.D. PROCEDURE DATE:  09/06/2011 PROCEDURE:   Colonoscopy, diagnostic ASA CLASS:   Class II INDICATIONS:diarrhea. MEDICATIONS: propofol (Diprivan) 200mg  IV  DESCRIPTION OF PROCEDURE:   After the risks benefits and alternatives of the procedure were thoroughly explained, informed consent was obtained.  A digital rectal exam revealed no rectal mass.   The LB [id number]  endoscope was introduced through the anus and advanced to the terminal ileum which was intubated for a short distance. No adverse events experienced.   The quality of the prep was poor.  The instrument was then slowly withdrawn as the colon was fully examined.    COLON FINDINGS: The mucosa appeared normal in the terminal ileum. A normal appearing cecum, ileocecal valve, and appendiceal orifice were identified.  The ascending, hepatic flexure, transverse, splenic flexure, descending, sigmoid colon and rectum appeared unremarkable.  No polyps or cancers were seen.  Retroflexed views revealed no abnormalities. The time to cecum=2 minutes 0 seconds Withdrawal time=8 minutes 15 seconds.  The scope was withdrawn and the procedure completed. COMPLICATIONS: There were no complications.  ENDOSCOPIC IMPRESSION: Normal terminal ileum, normal colon [R  RECOMMENDATIONS: Follow clinically  eSigned:  Rachael Fee, MD 09/06/2011 9:35 AM

## 2011-09-09 ENCOUNTER — Telehealth: Payer: Self-pay

## 2011-09-09 NOTE — Telephone Encounter (Signed)
Left message on answering machine. 

## 2011-09-12 ENCOUNTER — Telehealth: Payer: Self-pay | Admitting: *Deleted

## 2011-09-13 ENCOUNTER — Encounter: Payer: Self-pay | Admitting: Gastroenterology

## 2011-09-13 NOTE — Telephone Encounter (Signed)
Pt notified results not available, I will call when reviewed by Dr Christella Hartigan

## 2011-11-20 ENCOUNTER — Encounter (HOSPITAL_COMMUNITY): Payer: Self-pay | Admitting: *Deleted

## 2011-11-20 ENCOUNTER — Emergency Department (HOSPITAL_COMMUNITY)
Admission: EM | Admit: 2011-11-20 | Discharge: 2011-11-20 | Disposition: A | Discharge status: Home / Self Care | Payer: PRIVATE HEALTH INSURANCE | Attending: Emergency Medicine | Admitting: Emergency Medicine

## 2011-11-20 DIAGNOSIS — K3184 Gastroparesis: Secondary | ICD-10-CM | POA: Insufficient documentation

## 2011-11-20 DIAGNOSIS — Z79899 Other long term (current) drug therapy: Secondary | ICD-10-CM | POA: Insufficient documentation

## 2011-11-20 DIAGNOSIS — R51 Headache: Secondary | ICD-10-CM | POA: Insufficient documentation

## 2011-11-20 DIAGNOSIS — Z8639 Personal history of other endocrine, nutritional and metabolic disease: Secondary | ICD-10-CM | POA: Insufficient documentation

## 2011-11-20 DIAGNOSIS — Z8659 Personal history of other mental and behavioral disorders: Secondary | ICD-10-CM | POA: Insufficient documentation

## 2011-11-20 DIAGNOSIS — J329 Chronic sinusitis, unspecified: Secondary | ICD-10-CM | POA: Insufficient documentation

## 2011-11-20 DIAGNOSIS — IMO0001 Reserved for inherently not codable concepts without codable children: Secondary | ICD-10-CM | POA: Insufficient documentation

## 2011-11-20 DIAGNOSIS — Z8719 Personal history of other diseases of the digestive system: Secondary | ICD-10-CM | POA: Insufficient documentation

## 2011-11-20 DIAGNOSIS — J45909 Unspecified asthma, uncomplicated: Secondary | ICD-10-CM | POA: Insufficient documentation

## 2011-11-20 DIAGNOSIS — Z862 Personal history of diseases of the blood and blood-forming organs and certain disorders involving the immune mechanism: Secondary | ICD-10-CM | POA: Insufficient documentation

## 2011-11-20 DIAGNOSIS — F17200 Nicotine dependence, unspecified, uncomplicated: Secondary | ICD-10-CM | POA: Insufficient documentation

## 2011-11-20 DIAGNOSIS — J069 Acute upper respiratory infection, unspecified: Secondary | ICD-10-CM | POA: Insufficient documentation

## 2011-11-20 HISTORY — DX: Fibromyalgia: M79.7

## 2011-11-20 MED ORDER — PROMETHAZINE-CODEINE 6.25-10 MG/5ML PO SYRP: 5.0000 mL | ORAL_SOLUTION | Freq: Four times a day (QID) | ORAL | Status: DC | PRN

## 2011-11-20 MED ORDER — PSEUDOEPHEDRINE HCL 60 MG PO TABS: ORAL_TABLET | ORAL | Status: DC

## 2011-11-20 NOTE — ED Provider Notes (Signed)
History     CSN: [id number]  Arrival date & time 11/20/11  1327   First MD Initiated Contact with Patient 11/20/11 1357      Chief Complaint  Patient presents with  . Cough    (Consider location/radiation/quality/duration/timing/severity/associated sxs/prior treatment) Patient is a 26 y.o. female presenting with cough. The history is provided by the patient.  Cough This is a new problem. The current episode started more than 2 days ago. The problem occurs hourly. The problem has been gradually worsening. The cough is productive of sputum. The maximum temperature recorded prior to her arrival was 100 to 100.9 F. The fever has been present for 1 to 2 days. Associated symptoms include headaches, rhinorrhea, sore throat and myalgias. Pertinent negatives include no chest pain, no ear pain, no shortness of breath and no wheezing. She has tried nothing for the symptoms. The treatment provided no relief. She is a smoker. Her past medical history is significant for bronchitis.    Past Medical History  Diagnosis Date  . Other dysphagia   . Esophageal reflux   . Irritable bowel syndrome   . Hyperemesis gravidarum with metabolic disturbance, unspecified as to episode of care   . Anemia, unspecified   . Unspecified constipation   . Headache   . Unspecified asthma   . Gastroparesis   . Anxiety state, unspecified   . Other and unspecified noninfectious gastroenteritis and colitis   . Nausea alone   . Abdominal pain, unspecified site   . Fibromyalgia     Past Surgical History  Procedure Date  . Cholecystectomy   . Appendectomy     Family History  Problem Relation Age of Onset  . Colon cancer      PGGM  . Ulcerative colitis Paternal Grandmother   . Colon polyps Paternal Grandfather   . Stomach cancer      Paternal Great Aunt   . Stomach cancer Cousin     Maternal Side   . Diabetes Father   . Heart disease Father   . Kidney disease Father     History  Substance Use Topics    . Smoking status: Current Every Day Smoker -- 1.0 packs/day for 10 years  . Smokeless tobacco: Never Used  . Alcohol Use: No    OB History    Grav Para Term Preterm Abortions TAB SAB Ect Mult Living                  Review of Systems  Constitutional: Negative for activity change.       All ROS Neg except as noted in HPI  HENT: Positive for sore throat and rhinorrhea. Negative for ear pain, nosebleeds and neck pain.   Eyes: Negative for photophobia and discharge.  Respiratory: Positive for cough. Negative for shortness of breath and wheezing.   Cardiovascular: Negative for chest pain and palpitations.  Gastrointestinal: Negative for abdominal pain and blood in stool.  Genitourinary: Negative for dysuria, frequency and hematuria.  Musculoskeletal: Positive for myalgias. Negative for back pain and arthralgias.  Skin: Negative.   Neurological: Positive for headaches. Negative for dizziness, seizures and speech difficulty.  Psychiatric/Behavioral: Negative for hallucinations and confusion.    Allergies  Azithromycin; Penicillins; and Prednisone  Home Medications   Current Outpatient Rx  Name  Route  Sig  Dispense  Refill  . VITAMIN D 1000 UNITS PO TABS   Oral   Take 1,000 Units by mouth daily.         . DULOXETINE  HCL 60 MG PO CPEP   Oral   Take 60 mg by mouth daily.         Marland Kitchen METOCLOPRAMIDE HCL 10 MG PO TABS   Oral   Take 1 tablet (10 mg total) by mouth at bedtime.   30 tablet   3   . PREGABALIN 100 MG PO CAPS   Oral   Take 100 mg by mouth 3 (three) times daily.         Marland Kitchen PROMETHAZINE-CODEINE 6.25-10 MG/5ML PO SYRP   Oral   Take 5 mLs by mouth every 6 (six) hours as needed for cough.   150 mL   0   . PSEUDOEPHEDRINE HCL 60 MG PO TABS      1 po tid for congestion   30 tablet   0     BP 111/77  Pulse 87  Temp 98.2 F (36.8 C) (Oral)  Resp 18  Ht 5\' 7"  (1.702 m)  Wt 185 lb (83.915 kg)  BMI 28.97 kg/m2  SpO2 99%  LMP 11/11/2011  Physical  Exam  Nursing note and vitals reviewed. Constitutional: She is oriented to person, place, and time. She appears well-developed and well-nourished.  Non-toxic appearance.  HENT:  Head: Normocephalic.  Right Ear: Tympanic membrane and external ear normal.  Left Ear: Tympanic membrane and external ear normal.       Nasal turbinates are swollen and boggy.  Moderate congestion present. No hot area  Eyes: EOM and lids are normal. Pupils are equal, round, and reactive to light.  Neck: Normal range of motion. Neck supple. Carotid bruit is not present.  Cardiovascular: Normal rate, regular rhythm, normal heart sounds, intact distal pulses and normal pulses.   Pulmonary/Chest: Breath sounds normal. No respiratory distress.  Abdominal: Soft. Bowel sounds are normal. There is no tenderness. There is no guarding.  Musculoskeletal: Normal range of motion.  Lymphadenopathy:       Head (right side): No submandibular adenopathy present.       Head (left side): No submandibular adenopathy present.    She has no cervical adenopathy.  Neurological: She is alert and oriented to person, place, and time. She has normal strength. No cranial nerve deficit or sensory deficit.  Skin: Skin is warm and dry.  Psychiatric: She has a normal mood and affect. Her speech is normal.    ED Course  Procedures (including critical care time)  Labs Reviewed - No data to display No results found.   1. Sinusitis   2. URI (upper respiratory infection)       MDM  I have reviewed nursing notes, vital signs, and all appropriate lab and imaging results for this patient. Exam consistent with URI and Sinusitis. Will use sudafed and promethazine cough med. Tylenol or Motrin for aching. Pt advised to increase fluids and wash hands frequently.       Kathie Dike, [place] 11/20/11 1450

## 2011-11-20 NOTE — ED Notes (Signed)
Cough, nasal congestion, fever for 3 days

## 2011-11-21 NOTE — ED Provider Notes (Signed)
Medical screening examination/treatment/procedure(s) were performed by non-physician practitioner and as supervising physician I was immediately available for consultation/collaboration.   Joya Gaskins, MD 11/21/11 1258

## 2012-01-15 NOTE — L&D Delivery Note (Signed)
Delivery Note At 1:04 PM a viable female was delivered via Vaginal, Spontaneous Delivery (Presentation: Left Occiput Anterior).  APGAR: 9, 9; weight TBD.   Placenta status: Intact, Spontaneous.  Cord: 3 vessels with the following complications: None.    Anesthesia: Epidural  Episiotomy: None Lacerations: 1st degree;Perineal Suture Repair: 3.0 vicryl Est. Blood Loss (mL): 300  Mom to postpartum.  Baby to skin to skin.  Pt pushed with 2 contractions to deliver a liveborn female with spontaneous cry. 1st degree repaired with 3-0 vicryl with no complications.  Mom and baby doing well postpartum. Wants a tubal. Will schedule for the AM. Bottle feeding.   Maddelyn Rocca L 10/30/2012, 1:43 PM

## 2012-03-18 ENCOUNTER — Other Ambulatory Visit: Payer: Self-pay | Admitting: Family Medicine

## 2012-03-18 ENCOUNTER — Other Ambulatory Visit (HOSPITAL_COMMUNITY)
Admission: RE | Admit: 2012-03-18 | Discharge: 2012-03-18 | Disposition: A | Discharge status: Home / Self Care | Payer: BC Managed Care – PPO | Source: Ambulatory Visit | Attending: Obstetrics and Gynecology | Admitting: Obstetrics and Gynecology

## 2012-03-18 DIAGNOSIS — Z113 Encounter for screening for infections with a predominantly sexual mode of transmission: Secondary | ICD-10-CM | POA: Insufficient documentation

## 2012-03-18 DIAGNOSIS — Z01419 Encounter for gynecological examination (general) (routine) without abnormal findings: Secondary | ICD-10-CM | POA: Insufficient documentation

## 2012-03-18 LAB — OB RESULTS CONSOLE RPR: RPR: NONREACTIVE

## 2012-03-18 LAB — OB RESULTS CONSOLE ABO/RH: RH Type: POSITIVE

## 2012-03-18 LAB — OB RESULTS CONSOLE VARICELLA ZOSTER ANTIBODY, IGG: Varicella: IMMUNE

## 2012-03-18 LAB — OB RESULTS CONSOLE GC/CHLAMYDIA: Chlamydia: NEGATIVE

## 2012-03-19 ENCOUNTER — Encounter: Payer: Self-pay | Admitting: *Deleted

## 2012-03-25 ENCOUNTER — Other Ambulatory Visit: Payer: Self-pay | Admitting: Obstetrics & Gynecology

## 2012-03-31 ENCOUNTER — Telehealth: Payer: Self-pay | Admitting: Adult Health

## 2012-03-31 ENCOUNTER — Other Ambulatory Visit: Payer: Self-pay | Admitting: Obstetrics & Gynecology

## 2012-03-31 MED ORDER — TRAMADOL HCL 50 MG PO TABS: 50.0000 mg | ORAL_TABLET | Freq: Two times a day (BID) | ORAL | Status: DC | PRN

## 2012-03-31 NOTE — Telephone Encounter (Signed)
Dr. Despina Hidden please review and and handle.

## 2012-03-31 NOTE — Telephone Encounter (Signed)
Pt wants Tramadol called in, back is hurting.  Please call

## 2012-04-06 ENCOUNTER — Other Ambulatory Visit: Payer: Self-pay | Admitting: Adult Health

## 2012-04-06 MED ORDER — ONDANSETRON HCL 8 MG PO TABS: ORAL_TABLET | ORAL | Status: DC

## 2012-04-07 ENCOUNTER — Ambulatory Visit (INDEPENDENT_AMBULATORY_CARE_PROVIDER_SITE_OTHER): Payer: BC Managed Care – PPO | Admitting: Obstetrics & Gynecology

## 2012-04-07 VITALS — BP 100/74 | Wt 191.0 lb

## 2012-04-07 DIAGNOSIS — Z3490 Encounter for supervision of normal pregnancy, unspecified, unspecified trimester: Secondary | ICD-10-CM

## 2012-04-07 DIAGNOSIS — O0990 Supervision of high risk pregnancy, unspecified, unspecified trimester: Secondary | ICD-10-CM | POA: Insufficient documentation

## 2012-04-07 DIAGNOSIS — Z3481 Encounter for supervision of other normal pregnancy, first trimester: Secondary | ICD-10-CM

## 2012-04-07 DIAGNOSIS — O210 Mild hyperemesis gravidarum: Secondary | ICD-10-CM

## 2012-04-07 LAB — POCT URINALYSIS DIPSTICK
Blood, UA: NEGATIVE
Glucose, UA: NEGATIVE
Nitrite, UA: NEGATIVE

## 2012-04-07 MED ORDER — DOXYLAMINE-PYRIDOXINE 10-10 MG PO TBEC: 10.0000 mg | DELAYED_RELEASE_TABLET | Freq: Once | ORAL | Status: DC

## 2012-04-07 NOTE — Progress Notes (Signed)
Patient with significant nausea and vomiting, 6 times a day or so a little weight lost.  No bleeding or other complaints.  Diclegis 2 at bedtime plus salt and sour. Routine follow up in 4 weeks.

## 2012-04-07 NOTE — Patient Instructions (Signed)
Hyperemesis Gravidarum  Hyperemesis gravidarum is a severe form of nausea and vomiting that happens during pregnancy. Hyperemesis is worse than morning sickness. It may cause a woman to have nausea or vomiting all day for many days. It may keep a woman from eating and drinking enough food and liquids. Hyperemesis usually occurs during the first half (the first 20 weeks) of pregnancy. It often goes away once a woman is in her second half of pregnancy. However, sometimes hyperemesis continues through an entire pregnancy.   CAUSES   The cause of this condition is not completely known but is thought to be due to changes in the body's hormones when pregnant. It could be the high level of the pregnancy hormone or an increase in estrogen in the body.   SYMPTOMS    Severe nausea and vomiting.   Nausea that does not go away.   Vomiting that does not allow you to keep any food down.   Weight loss and body fluid loss (dehydration).   Having no desire to eat or not liking food you have previously enjoyed.  DIAGNOSIS   Your caregiver may ask you about your symptoms. Your caregiver may also order blood tests and urine tests to make sure something else is not causing the problem.   TREATMENT   You may only need medicine to control the problem. If medicines do not control the nausea and vomiting, you will be treated in the hospital to prevent dehydration, acidosis, weight loss, and changes in the electrolytes in your body that may harm the unborn baby (fetus). You may need intravenous (IV) fluids.   HOME CARE INSTRUCTIONS    Take all medicine as directed by your caregiver.   Try eating a couple of dry crackers or toast in the morning before getting out of bed.   Avoid foods and smells that upset your stomach.   Avoid fatty and spicy foods. Eat 5 to 6 small meals a day.   Do not drink when eating meals. Drink between meals.   For snacks, eat high protein foods, such as cheese. Eat or suck on things that have ginger in  them. Ginger helps nausea.   Avoid food preparation. The smell of food can spoil your appetite.   Avoid iron pills and iron in your multivitamins until after 3 to 4 months of being pregnant.  SEEK MEDICAL CARE IF:    Your abdominal pain increases since the last time you saw your caregiver.   You have a severe headache.   You develop vision problems.   You feel you are losing weight.  SEEK IMMEDIATE MEDICAL CARE IF:    You are unable to keep fluids down.   You vomit blood.   You have constant nausea and vomiting.   You have a fever.   You have excessive weakness, dizziness, fainting, or extreme thirst.  MAKE SURE YOU:    Understand these instructions.   Will watch your condition.   Will get help right away if you are not doing well or get worse.  Document Released: 12/31/2004 Document Revised: 03/25/2011 Document Reviewed: 04/02/2010  [employer] Patient Information 2013 [employer].

## 2012-04-09 ENCOUNTER — Telehealth: Payer: Self-pay | Admitting: *Deleted

## 2012-04-09 ENCOUNTER — Telehealth: Payer: Self-pay | Admitting: Obstetrics & Gynecology

## 2012-04-09 NOTE — Telephone Encounter (Signed)
Left message x2.

## 2012-04-09 NOTE — Telephone Encounter (Signed)
Left message @ 2:48 04/09/12

## 2012-04-10 ENCOUNTER — Other Ambulatory Visit: Payer: Self-pay | Admitting: *Deleted

## 2012-04-10 MED ORDER — DOXYLAMINE-PYRIDOXINE 10-10 MG PO TBEC: 10.0000 mg | DELAYED_RELEASE_TABLET | Freq: Once | ORAL | Status: DC

## 2012-04-10 NOTE — Progress Notes (Signed)
Pt could not afford, samples given of Diclegis.

## 2012-04-13 ENCOUNTER — Telehealth: Payer: Self-pay | Admitting: Obstetrics & Gynecology

## 2012-04-13 MED ORDER — PROMETHAZINE HCL 25 MG PO TABS: 25.0000 mg | ORAL_TABLET | Freq: Four times a day (QID) | ORAL | Status: DC | PRN

## 2012-04-13 NOTE — Telephone Encounter (Signed)
Pt states Diclegis not helping with n/v.    Can you give RX for phenergan?

## 2012-04-15 ENCOUNTER — Emergency Department (HOSPITAL_COMMUNITY)
Admission: EM | Admit: 2012-04-15 | Discharge: 2012-04-15 | Disposition: A | Discharge status: Home / Self Care | Payer: Medicaid Other | Attending: Emergency Medicine | Admitting: Emergency Medicine

## 2012-04-15 ENCOUNTER — Telehealth: Payer: Self-pay | Admitting: Obstetrics & Gynecology

## 2012-04-15 ENCOUNTER — Encounter (HOSPITAL_COMMUNITY): Payer: Self-pay | Admitting: Emergency Medicine

## 2012-04-15 DIAGNOSIS — O2390 Unspecified genitourinary tract infection in pregnancy, unspecified trimester: Secondary | ICD-10-CM | POA: Insufficient documentation

## 2012-04-15 DIAGNOSIS — O210 Mild hyperemesis gravidarum: Secondary | ICD-10-CM

## 2012-04-15 DIAGNOSIS — F17200 Nicotine dependence, unspecified, uncomplicated: Secondary | ICD-10-CM | POA: Insufficient documentation

## 2012-04-15 DIAGNOSIS — O9989 Other specified diseases and conditions complicating pregnancy, childbirth and the puerperium: Secondary | ICD-10-CM | POA: Insufficient documentation

## 2012-04-15 DIAGNOSIS — E876 Hypokalemia: Secondary | ICD-10-CM | POA: Insufficient documentation

## 2012-04-15 DIAGNOSIS — J45909 Unspecified asthma, uncomplicated: Secondary | ICD-10-CM | POA: Insufficient documentation

## 2012-04-15 DIAGNOSIS — O99330 Smoking (tobacco) complicating pregnancy, unspecified trimester: Secondary | ICD-10-CM | POA: Insufficient documentation

## 2012-04-15 DIAGNOSIS — R5381 Other malaise: Secondary | ICD-10-CM | POA: Insufficient documentation

## 2012-04-15 DIAGNOSIS — N390 Urinary tract infection, site not specified: Secondary | ICD-10-CM

## 2012-04-15 HISTORY — DX: Encounter for supervision of normal pregnancy, unspecified, unspecified trimester: Z34.90

## 2012-04-15 LAB — CBC WITH DIFFERENTIAL/PLATELET
Basophils Absolute: 0 10*3/uL (ref 0.0–0.1)
Basophils Relative: 0 % (ref 0–1)
Eosinophils Absolute: 0.1 10*3/uL (ref 0.0–0.7)
Hemoglobin: 15.7 g/dL — ABNORMAL HIGH (ref 12.0–15.0)
MCH: 32.2 pg (ref 26.0–34.0)
MCHC: 36.7 g/dL — ABNORMAL HIGH (ref 30.0–36.0)
Monocytes Relative: 6 % (ref 3–12)
Neutrophils Relative %: 70 % (ref 43–77)
Platelets: 284 10*3/uL (ref 150–400)
RDW: 12.4 % (ref 11.5–15.5)

## 2012-04-15 LAB — COMPREHENSIVE METABOLIC PANEL
AST: 35 U/L (ref 0–37)
Albumin: 4.2 g/dL (ref 3.5–5.2)
Alkaline Phosphatase: 69 U/L (ref 39–117)
BUN: 4 mg/dL — ABNORMAL LOW (ref 6–23)
Creatinine, Ser: 0.38 mg/dL — ABNORMAL LOW (ref 0.50–1.10)
Potassium: 3.3 mEq/L — ABNORMAL LOW (ref 3.5–5.1)
Total Protein: 7.3 g/dL (ref 6.0–8.3)

## 2012-04-15 LAB — URINALYSIS, ROUTINE W REFLEX MICROSCOPIC
Glucose, UA: NEGATIVE mg/dL
pH: 7 (ref 5.0–8.0)

## 2012-04-15 LAB — URINE MICROSCOPIC-ADD ON

## 2012-04-15 MED ORDER — NITROFURANTOIN MONOHYD MACRO 100 MG PO CAPS: 100.0000 mg | ORAL_CAPSULE | Freq: Two times a day (BID) | ORAL | Status: DC

## 2012-04-15 MED ORDER — ONDANSETRON HCL 4 MG/2ML IJ SOLN
4.0000 mg | Freq: Once | INTRAMUSCULAR | Status: AC
  Administered 2012-04-15: 4 mg via INTRAVENOUS
  Filled 2012-04-15: qty 2

## 2012-04-15 MED ORDER — POTASSIUM CHLORIDE CRYS ER 20 MEQ PO TBCR
20.0000 meq | EXTENDED_RELEASE_TABLET | Freq: Once | ORAL | Status: AC
  Administered 2012-04-15: 20 meq via ORAL
  Filled 2012-04-15: qty 1

## 2012-04-15 MED ORDER — ACETAMINOPHEN 325 MG PO TABS
650.0000 mg | ORAL_TABLET | Freq: Once | ORAL | Status: AC
  Administered 2012-04-15: 650 mg via ORAL
  Filled 2012-04-15: qty 2

## 2012-04-15 MED ORDER — SODIUM CHLORIDE 0.9 % IV BOLUS (SEPSIS)
1000.0000 mL | Freq: Once | INTRAVENOUS | Status: AC
  Administered 2012-04-15: 1000 mL via INTRAVENOUS

## 2012-04-15 MED ORDER — PROMETHAZINE HCL 25 MG RE SUPP: 25.0000 mg | Freq: Four times a day (QID) | RECTAL | Status: DC | PRN

## 2012-04-15 NOTE — Telephone Encounter (Signed)
[facility] ER called stating the pt was over there and wanted to know if the pt should stay over there or come to our office to be seen. Pt is having hyperemesis and thinks she be dehydrated. She can't keep anything down. I advised the ER to keep her there if she felt she is dehydrated. I also spoke with Drenda Freeze about this and she agreed.

## 2012-04-15 NOTE — ED Notes (Signed)
Pt sent by OB/GYN for fluids due to frequent vomiting from being pregnant

## 2012-04-15 NOTE — ED Notes (Signed)
2 ginger ales given to pt.

## 2012-04-15 NOTE — ED Notes (Signed)
Fetal Heart Tones = 164

## 2012-04-16 NOTE — ED Provider Notes (Signed)
History     CSN: [id number]  Arrival date & time 04/15/12  1127   First MD Initiated Contact with Patient 04/15/12 1142      Chief Complaint  Patient presents with  . Hyperemesis Gravidarum    (Consider location/radiation/quality/duration/timing/severity/associated sxs/prior treatment) HPI Comments: [name] is a 27 y.o. Female who is currently [redacted] weeks pregnant based on [place] completed in her obgyn's office, presenting with hyperemesis gravidarum.  She has been on phenergan prescribed by Dr. Despina Hidden,  But has not been able to keep this down nor any other PO intake for the past 2 days.  She feels weak and dehydrated and was unable to be seen in the [facility] office today.  She denies abdominal pain and has had no fevers or chills and no vaginal discharge or bleeding.  She last tried taking phenergan around 8 am today but was unable to keep down. Nausea symptoms have been constant.     The history is provided by the patient.    Past Medical History  Diagnosis Date  . Other dysphagia   . Esophageal reflux   . Irritable bowel syndrome   . Hyperemesis gravidarum with metabolic disturbance, unspecified as to episode of care   . Anemia, unspecified   . Unspecified constipation   . Headache   . Unspecified asthma   . Gastroparesis   . Anxiety state, unspecified   . Other and unspecified noninfectious gastroenteritis and colitis   . Nausea alone   . Abdominal pain, unspecified site   . Fibromyalgia   . Irritable bowel syndrome   . Fatty liver   . Pregnant     Past Surgical History  Procedure Laterality Date  . Cholecystectomy    . Appendectomy      Family History  Problem Relation Age of Onset  . Colon cancer      PGGM  . Stomach cancer      Paternal Great Aunt   . Ulcerative colitis Paternal Grandmother   . Colon polyps Paternal Grandfather   . Cancer Paternal Grandfather     thyroid  . Stomach cancer Cousin     Maternal Side   . Diabetes Father   . Heart  disease Father   . Kidney disease Father   . Hypertension Father     History  Substance Use Topics  . Smoking status: Current Every Day Smoker -- 0.50 packs/day for 10 years    Types: Cigarettes  . Smokeless tobacco: Never Used  . Alcohol Use: No    OB History   Grav Para Term Preterm Abortions TAB SAB Ect Mult Living   3 2 2       2       Review of Systems  Constitutional: Negative for fever and chills.  HENT: Negative for congestion, sore throat and neck pain.   Eyes: Negative.   Respiratory: Negative for chest tightness and shortness of breath.   Cardiovascular: Negative for chest pain.  Gastrointestinal: Positive for nausea and vomiting. Negative for abdominal pain, diarrhea and abdominal distention.  Genitourinary: Negative.  Negative for dysuria, hematuria, vaginal bleeding, vaginal discharge and pelvic pain.  Musculoskeletal: Negative for joint swelling and arthralgias.  Skin: Negative.  Negative for rash and wound.  Neurological: Positive for weakness. Negative for dizziness, light-headedness, numbness and headaches.  Psychiatric/Behavioral: Negative.     Allergies  Azithromycin; Nsaids; Penicillins; and Prednisone  Home Medications   Current Outpatient Rx  Name  Route  Sig  Dispense  Refill  .  Doxylamine-Pyridoxine (DICLEGIS) 10-10 MG TBEC   Oral   Take 10 mg by mouth once.   48 tablet   0     Medication Samples have been provided to the patie ...   . gabapentin (NEURONTIN) 300 MG capsule   Oral   Take 300 mg by mouth 3 (three) times daily.         . ondansetron (ZOFRAN) 8 MG tablet      Take 1 every 8 to 12 hours prn nausea and vomitint         . Prenatal Vitamins (DIS) TABS   Oral   Take 1 tablet by mouth daily.         . promethazine (PHENERGAN) 25 MG tablet   Oral   Take 1 tablet (25 mg total) by mouth every 6 (six) hours as needed for nausea.   30 tablet   1   . traMADol (ULTRAM) 50 MG tablet   Oral   Take 1 tablet (50 mg total)  by mouth 2 (two) times daily as needed for pain.   60 tablet   0   . nitrofurantoin, macrocrystal-monohydrate, (MACROBID) 100 MG capsule   Oral   Take 1 capsule (100 mg total) by mouth 2 (two) times daily.   10 capsule   0   . promethazine (PHENERGAN) 25 MG suppository   Rectal   Place 1 suppository (25 mg total) rectally every 6 (six) hours as needed for nausea.   12 each   0     BP 104/70  Pulse 101  Temp(Src) 98.3 F (36.8 C) (Oral)  Resp 20  Ht 5\' 8"  (1.727 m)  Wt 185 lb (83.915 kg)  BMI 28.14 kg/m2  SpO2 99%  Physical Exam  Nursing note and vitals reviewed. Constitutional: She appears well-developed and well-nourished.  HENT:  Head: Normocephalic and atraumatic.  Eyes: Conjunctivae are normal.  Neck: Normal range of motion.  Cardiovascular: Normal rate, regular rhythm, normal heart sounds and intact distal pulses.   Pulmonary/Chest: Effort normal and breath sounds normal. She has no wheezes.  Abdominal: Soft. Bowel sounds are normal. She exhibits no mass. There is no tenderness. There is no rebound and no guarding.  Genitourinary:  deferred  Musculoskeletal: Normal range of motion.  Neurological: She is alert.  Skin: Skin is warm and dry.  Psychiatric: She has a normal mood and affect.    ED Course  Procedures (including critical care time)  Labs Reviewed  CBC WITH DIFFERENTIAL - Abnormal; Notable for the following:    WBC 12.9 (*)    Hemoglobin 15.7 (*)    MCHC 36.7 (*)    Neutro Abs 8.9 (*)    All other components within normal limits  COMPREHENSIVE METABOLIC PANEL - Abnormal; Notable for the following:    Potassium 3.3 (*)    BUN 4 (*)    Creatinine, Ser 0.38 (*)    ALT 61 (*)    All other components within normal limits  URINALYSIS, ROUTINE W REFLEX MICROSCOPIC - Abnormal; Notable for the following:    APPearance CLOUDY (*)    Ketones, ur 40 (*)    Protein, ur 30 (*)    Leukocytes, UA LARGE (*)    All other components within normal limits   URINE MICROSCOPIC-ADD ON - Abnormal; Notable for the following:    Squamous Epithelial / LPF MANY (*)    Bacteria, UA MANY (*)    All other components within normal limits  URINE CULTURE  No results found.   1. Hyperemesis gravidarum   2. UTI (lower urinary tract infection)     Medications  sodium chloride 0.9 % bolus 1,000 mL (0 mLs Intravenous Stopped 04/15/12 1437)  ondansetron (ZOFRAN) injection 4 mg (4 mg Intravenous Given 04/15/12 1215)  potassium chloride SA (K-DUR,KLOR-CON) CR tablet 20 mEq (20 mEq Oral Given 04/15/12 1356)  sodium chloride 0.9 % bolus 1,000 mL (0 mLs Intravenous Stopped 04/15/12 1438)  acetaminophen (TYLENOL) tablet 650 mg (650 mg Oral Given 04/15/12 1357)     MDM  Labs reviewed,  Patient with mild hypokalemia, this was replenished with po potassium.  She was given NS per IV along with zofran.  She was able to tolerated po fluids prior to dc home.  Also,  Discussed UA results, she denies dysuria sx.  She was prescribed macrobid and also presdribed phenergan suppository to use in place of phenergan if she is unable to keep down tablets.  PRN f/u anticipated,  Encouraged to f/u with Dr. Despina Hidden as needed, or here if sx worsen.  The patient appears reasonably screened and/or stabilized for discharge and I doubt any other medical condition or other [facility] requiring further screening, evaluation, or treatment in the ED at this time prior to discharge.         Burgess Amor, PA-C 04/19/12 2037

## 2012-04-19 NOTE — ED Provider Notes (Signed)
Medical screening examination/treatment/procedure(s) were performed by non-physician practitioner and as supervising physician I was immediately available for consultation/collaboration. Devoria Albe, MD, Armando Gang   Ward Givens, MD 04/19/12 [phone]

## 2012-04-27 ENCOUNTER — Other Ambulatory Visit: Payer: Self-pay | Admitting: *Deleted

## 2012-04-27 MED ORDER — TRAMADOL HCL 50 MG PO TABS: 50.0000 mg | ORAL_TABLET | Freq: Two times a day (BID) | ORAL | Status: DC | PRN

## 2012-04-29 ENCOUNTER — Other Ambulatory Visit: Payer: Self-pay

## 2012-04-29 ENCOUNTER — Encounter: Payer: Self-pay | Admitting: Obstetrics and Gynecology

## 2012-05-05 ENCOUNTER — Encounter: Payer: Self-pay | Admitting: Obstetrics & Gynecology

## 2012-05-05 ENCOUNTER — Ambulatory Visit (INDEPENDENT_AMBULATORY_CARE_PROVIDER_SITE_OTHER): Payer: BC Managed Care – PPO | Admitting: Obstetrics & Gynecology

## 2012-05-05 VITALS — BP 114/62 | Wt 185.0 lb

## 2012-05-05 DIAGNOSIS — F1920 Other psychoactive substance dependence, uncomplicated: Secondary | ICD-10-CM

## 2012-05-05 DIAGNOSIS — O99320 Drug use complicating pregnancy, unspecified trimester: Secondary | ICD-10-CM

## 2012-05-05 DIAGNOSIS — Z3480 Encounter for supervision of other normal pregnancy, unspecified trimester: Secondary | ICD-10-CM

## 2012-05-05 DIAGNOSIS — O210 Mild hyperemesis gravidarum: Secondary | ICD-10-CM

## 2012-05-05 LAB — POCT URINALYSIS DIPSTICK
Glucose, UA: NEGATIVE
Ketones, UA: NEGATIVE

## 2012-05-05 MED ORDER — PROMETHAZINE HCL 25 MG RE SUPP: 25.0000 mg | Freq: Four times a day (QID) | RECTAL | Status: DC | PRN

## 2012-05-05 MED ORDER — ONDANSETRON HCL 8 MG PO TABS: 8.0000 mg | ORAL_TABLET | Freq: Three times a day (TID) | ORAL | Status: DC | PRN

## 2012-05-05 MED ORDER — OMEPRAZOLE 20 MG PO CPDR: 20.0000 mg | DELAYED_RELEASE_CAPSULE | Freq: Every day | ORAL | Status: DC

## 2012-05-05 NOTE — Progress Notes (Signed)
Throwing up several times a day.  Will refill phenergan suppositories and zofran po. Add prilosec

## 2012-05-05 NOTE — Patient Instructions (Signed)
Pregnancy - Second Trimester The second trimester of pregnancy (3 to 6 months) is a period of rapid growth for you and your baby. At the end of the sixth month, your baby is about 9 inches long and weighs 1 1/2 pounds. You will begin to feel the baby move between 18 and 20 weeks of the pregnancy. This is called quickening. Weight gain is faster. A clear fluid (colostrum) may leak out of your breasts. You may feel small contractions of the womb (uterus). This is known as false labor or Braxton-Hicks contractions. This is like a practice for labor when the baby is ready to be born. Usually, the problems with morning sickness have usually passed by the end of your first trimester. Some women develop small dark blotches (called cholasma, mask of pregnancy) on their face that usually goes away after the baby is born. Exposure to the sun makes the blotches worse. Acne may also develop in some pregnant women and pregnant women who have acne, may find that it goes away. PRENATAL EXAMS  Blood work may continue to be done during prenatal exams. These tests are done to check on your health and the probable health of your baby. Blood work is used to follow your blood levels (hemoglobin). Anemia (low hemoglobin) is common during pregnancy. Iron and vitamins are given to help prevent this. You will also be checked for diabetes between 24 and 28 weeks of the pregnancy. Some of the previous blood tests may be repeated.  The size of the uterus is measured during each visit. This is to make sure that the baby is continuing to grow properly according to the dates of the pregnancy.  Your blood pressure is checked every prenatal visit. This is to make sure you are not getting toxemia.  Your urine is checked to make sure you do not have an infection, diabetes or protein in the urine.  Your weight is checked often to make sure gains are happening at the suggested rate. This is to ensure that both you and your baby are growing  normally.  Sometimes, an ultrasound is performed to confirm the proper growth and development of the baby. This is a test which bounces harmless sound waves off the baby so your caregiver can more accurately determine due dates. Sometimes, a specialized test is done on the amniotic fluid surrounding the baby. This test is called an amniocentesis. The amniotic fluid is obtained by sticking a needle into the belly (abdomen). This is done to check the chromosomes in instances where there is a concern about possible genetic problems with the baby. It is also sometimes done near the end of pregnancy if an early delivery is required. In this case, it is done to help make sure the baby's lungs are mature enough for the baby to live outside of the womb. CHANGES OCCURING IN THE SECOND TRIMESTER OF PREGNANCY Your body goes through many changes during pregnancy. They vary from person to person. Talk to your caregiver about changes you notice that you are concerned about.  During the second trimester, you will likely have an increase in your appetite. It is normal to have cravings for certain foods. This varies from person to person and pregnancy to pregnancy.  Your lower abdomen will begin to bulge.  You may have to urinate more often because the uterus and baby are pressing on your bladder. It is also common to get more bladder infections during pregnancy (pain with urination). You can help this by   drinking lots of fluids and emptying your bladder before and after intercourse.  You may begin to get stretch marks on your hips, abdomen, and breasts. These are normal changes in the body during pregnancy. There are no exercises or medications to take that prevent this change.  You may begin to develop swollen and bulging veins (varicose veins) in your legs. Wearing support hose, elevating your feet for 15 minutes, 3 to 4 times a day and limiting salt in your diet helps lessen the problem.  Heartburn may develop  as the uterus grows and pushes up against the stomach. Antacids recommended by your caregiver helps with this problem. Also, eating smaller meals 4 to 5 times a day helps.  Constipation can be treated with a stool softener or adding bulk to your diet. Drinking lots of fluids, vegetables, fruits, and whole grains are helpful.  Exercising is also helpful. If you have been very active up until your pregnancy, most of these activities can be continued during your pregnancy. If you have been less active, it is helpful to start an exercise program such as walking.  Hemorrhoids (varicose veins in the rectum) may develop at the end of the second trimester. Warm sitz baths and hemorrhoid cream recommended by your caregiver helps hemorrhoid problems.  Backaches may develop during this time of your pregnancy. Avoid heavy lifting, wear low heal shoes and practice good posture to help with backache problems.  Some pregnant women develop tingling and numbness of their hand and fingers because of swelling and tightening of ligaments in the wrist (carpel tunnel syndrome). This goes away after the baby is born.  As your breasts enlarge, you may have to get a bigger bra. Get a comfortable, cotton, support bra. Do not get a nursing bra until the last month of the pregnancy if you will be nursing the baby.  You may get a dark line from your belly button to the pubic area called the linea nigra.  You may develop rosy cheeks because of increase blood flow to the face.  You may develop spider looking lines of the face, neck, arms and chest. These go away after the baby is born. HOME CARE INSTRUCTIONS   It is extremely important to avoid all smoking, herbs, alcohol, and unprescribed drugs during your pregnancy. These chemicals affect the formation and growth of the baby. Avoid these chemicals throughout the pregnancy to ensure the delivery of a healthy infant.  Most of your home care instructions are the same as  suggested for the first trimester of your pregnancy. Keep your caregiver's appointments. Follow your caregiver's instructions regarding medication use, exercise and diet.  During pregnancy, you are providing food for you and your baby. Continue to eat regular, well-balanced meals. Choose foods such as meat, fish, milk and other low fat dairy products, vegetables, fruits, and whole-grain breads and cereals. Your caregiver will tell you of the ideal weight gain.  A physical sexual relationship may be continued up until near the end of pregnancy if there are no other problems. Problems could include early (premature) leaking of amniotic fluid from the membranes, vaginal bleeding, abdominal pain, or other medical or pregnancy problems.  Exercise regularly if there are no restrictions. Check with your caregiver if you are unsure of the safety of some of your exercises. The greatest weight gain will occur in the last 2 trimesters of pregnancy. Exercise will help you:  Control your weight.  Get you in shape for labor and delivery.  Lose weight   after you have the baby.  Wear a good support or jogging bra for breast tenderness during pregnancy. This may help if worn during sleep. Pads or tissues may be used in the bra if you are leaking colostrum.  Do not use hot tubs, steam rooms or saunas throughout the pregnancy.  Wear your seat belt at all times when driving. This protects you and your baby if you are in an accident.  Avoid raw meat, uncooked cheese, cat litter boxes and soil used by cats. These carry germs that can cause birth defects in the baby.  The second trimester is also a good time to visit your dentist for your dental health if this has not been done yet. Getting your teeth cleaned is OK. Use a soft toothbrush. Brush gently during pregnancy.  It is easier to loose urine during pregnancy. Tightening up and strengthening the pelvic muscles will help with this problem. Practice stopping your  urination while you are going to the bathroom. These are the same muscles you need to strengthen. It is also the muscles you would use as if you were trying to stop from passing gas. You can practice tightening these muscles up 10 times a set and repeating this about 3 times per day. Once you know what muscles to tighten up, do not perform these exercises during urination. It is more likely to contribute to an infection by backing up the urine.  Ask for help if you have financial, counseling or nutritional needs during pregnancy. Your caregiver will be able to offer counseling for these needs as well as refer you for other special needs.  Your skin may become oily. If so, wash your face with mild soap, use non-greasy moisturizer and oil or cream based makeup. MEDICATIONS AND DRUG USE IN PREGNANCY  Take prenatal vitamins as directed. The vitamin should contain 1 milligram of folic acid. Keep all vitamins out of reach of children. Only a couple vitamins or tablets containing iron may be fatal to a baby or young child when ingested.  Avoid use of all medications, including herbs, over-the-counter medications, not prescribed or suggested by your caregiver. Only take over-the-counter or prescription medicines for pain, discomfort, or fever as directed by your caregiver. Do not use aspirin.  Let your caregiver also know about herbs you may be using.  Alcohol is related to a number of birth defects. This includes fetal alcohol syndrome. All alcohol, in any form, should be avoided completely. Smoking will cause low birth rate and premature babies.  Street or illegal drugs are very harmful to the baby. They are absolutely forbidden. A baby born to an addicted mother will be addicted at birth. The baby will go through the same withdrawal an adult does. SEEK MEDICAL CARE IF:  You have any concerns or worries during your pregnancy. It is better to call with your questions if you feel they cannot wait, rather  than worry about them. SEEK IMMEDIATE MEDICAL CARE IF:   An unexplained oral temperature above 102 F (38.9 C) develops, or as your caregiver suggests.  You have leaking of fluid from the vagina (birth canal). If leaking membranes are suspected, take your temperature and tell your caregiver of this when you call.  There is vaginal spotting, bleeding, or passing clots. Tell your caregiver of the amount and how many pads are used. Light spotting in pregnancy is common, especially following intercourse.  You develop a bad smelling vaginal discharge with a change in the color from clear   to white.  You continue to feel sick to your stomach (nauseated) and have no relief from remedies suggested. You vomit blood or coffee ground-like materials.  You lose more than 2 pounds of weight or gain more than 2 pounds of weight over 1 week, or as suggested by your caregiver.  You notice swelling of your face, hands, feet, or legs.  You get exposed to German measles and have never had them.  You are exposed to fifth disease or chickenpox.  You develop belly (abdominal) pain. Round ligament discomfort is a common non-cancerous (benign) cause of abdominal pain in pregnancy. Your caregiver still must evaluate you.  You develop a bad headache that does not go away.  You develop fever, diarrhea, pain with urination, or shortness of breath.  You develop visual problems, blurry, or double vision.  You fall or are in a car accident or any kind of trauma.  There is mental or physical violence at home. Document Released: 12/25/2000 Document Revised: 03/25/2011 Document Reviewed: 06/29/2008 [employer] Patient Information 2013 [employer].  

## 2012-05-11 ENCOUNTER — Other Ambulatory Visit: Payer: Self-pay | Admitting: Obstetrics & Gynecology

## 2012-05-11 DIAGNOSIS — Z1389 Encounter for screening for other disorder: Secondary | ICD-10-CM

## 2012-06-01 ENCOUNTER — Telehealth: Payer: Self-pay | Admitting: Obstetrics & Gynecology

## 2012-06-01 NOTE — Telephone Encounter (Signed)
Pt wants refill on tramadol. Pharmacy says they have sent refill request, but have not received anything.

## 2012-06-02 ENCOUNTER — Ambulatory Visit (INDEPENDENT_AMBULATORY_CARE_PROVIDER_SITE_OTHER): Payer: BC Managed Care – PPO | Admitting: Obstetrics & Gynecology

## 2012-06-02 ENCOUNTER — Ambulatory Visit (INDEPENDENT_AMBULATORY_CARE_PROVIDER_SITE_OTHER): Payer: BC Managed Care – PPO

## 2012-06-02 VITALS — BP 98/64 | Wt 189.0 lb

## 2012-06-02 DIAGNOSIS — Z1389 Encounter for screening for other disorder: Secondary | ICD-10-CM

## 2012-06-02 DIAGNOSIS — Z331 Pregnant state, incidental: Secondary | ICD-10-CM

## 2012-06-02 DIAGNOSIS — Z3480 Encounter for supervision of other normal pregnancy, unspecified trimester: Secondary | ICD-10-CM

## 2012-06-02 LAB — POCT URINALYSIS DIPSTICK
Ketones, UA: NEGATIVE
Protein, UA: NEGATIVE

## 2012-06-02 MED ORDER — TRAMADOL HCL 50 MG PO TABS: 50.0000 mg | ORAL_TABLET | Freq: Two times a day (BID) | ORAL | Status: DC | PRN

## 2012-06-02 NOTE — Progress Notes (Signed)
C/o "bad cramping"

## 2012-06-02 NOTE — Progress Notes (Signed)
BP weight and urine results all reviewed and noted. Patient reports good fetal movement, denies any bleeding and no rupture of membranes symptoms or regular contractions. Patient is without complaints. All questions were answered. No complaints except frontal headaches, probably progesterone mediated.

## 2012-06-02 NOTE — Progress Notes (Signed)
U/S(17+5wks)-active fetus, meas c/w dates, fluid wnl, post gr 0 plac, cx long and closed, bilateral adnexa wnl, no major abnl noted, female fetus

## 2012-06-10 LAB — US OB DETAIL + 14 WK

## 2012-06-11 ENCOUNTER — Telehealth: Payer: Self-pay | Admitting: *Deleted

## 2012-06-11 MED ORDER — ONDANSETRON HCL 8 MG PO TABS: 8.0000 mg | ORAL_TABLET | Freq: Three times a day (TID) | ORAL | Status: DC | PRN

## 2012-06-11 NOTE — Telephone Encounter (Signed)
zofran e prescribed, pt already on prilosec

## 2012-06-11 NOTE — Telephone Encounter (Signed)
Spoke with pt. Has been on Tramadol x 4 years. Is now vomiting after taking it. Not helping pain at all. Next appt is June 17. What do you advise? Uses [facility] in [place].

## 2012-06-11 NOTE — Telephone Encounter (Signed)
Left message. JSY 

## 2012-06-11 NOTE — Telephone Encounter (Signed)
Left message that Zofran had been e prescribed to [facility] in [place]. Dr. Despina Hidden not changing pain meds at this time. JSY

## 2012-06-23 ENCOUNTER — Telehealth: Payer: Self-pay | Admitting: Obstetrics & Gynecology

## 2012-06-23 NOTE — Telephone Encounter (Signed)
Pt c/o hard cough, small amount of mucus, tried OTC medication with no improvement, no fever, +FM, no bleeding. Pt states having a lot of pressure lower abdomen and vaginal area from coughing. Appointment made tomorrow at 9:30 am to see provider here, however, pt encouraged to go to [facility] with any bleeding, decrease FM, increase pain. Pt verbalized understanding.

## 2012-06-24 ENCOUNTER — Telehealth: Payer: Self-pay | Admitting: Obstetrics and Gynecology

## 2012-06-24 ENCOUNTER — Ambulatory Visit (INDEPENDENT_AMBULATORY_CARE_PROVIDER_SITE_OTHER): Payer: BC Managed Care – PPO | Admitting: Obstetrics and Gynecology

## 2012-06-24 ENCOUNTER — Encounter: Payer: Self-pay | Admitting: Obstetrics and Gynecology

## 2012-06-24 VITALS — BP 90/60 | Wt 182.2 lb

## 2012-06-24 DIAGNOSIS — J069 Acute upper respiratory infection, unspecified: Secondary | ICD-10-CM

## 2012-06-24 DIAGNOSIS — M797 Fibromyalgia: Secondary | ICD-10-CM

## 2012-06-24 DIAGNOSIS — Z1389 Encounter for screening for other disorder: Secondary | ICD-10-CM

## 2012-06-24 DIAGNOSIS — Z3480 Encounter for supervision of other normal pregnancy, unspecified trimester: Secondary | ICD-10-CM

## 2012-06-24 LAB — POCT URINALYSIS DIPSTICK
Ketones, UA: NEGATIVE
Leukocytes, UA: NEGATIVE

## 2012-06-24 MED ORDER — HYDROCODONE-HOMATROPINE 5-1.5 MG/5ML PO SYRP: 5.0000 mL | ORAL_SOLUTION | Freq: Four times a day (QID) | ORAL | Status: DC | PRN

## 2012-06-24 MED ORDER — ALBUTEROL SULFATE HFA 108 (90 BASE) MCG/ACT IN AERS: 2.0000 | INHALATION_SPRAY | Freq: Four times a day (QID) | RESPIRATORY_TRACT | Status: DC | PRN

## 2012-06-24 NOTE — Addendum Note (Signed)
Addended by: Tilda Burrow on: 06/24/2012 04:28 PM   Modules accepted: Orders

## 2012-06-24 NOTE — Progress Notes (Signed)
Pt here today for cough and congestion. Pt states she has some soreness in her lower abdominal area right above her pelvis and has noticed some pressure when she walks. Pt denies any other issues at this time.

## 2012-06-24 NOTE — Telephone Encounter (Signed)
Pt came by office and followed up with Dr. Emelda Fear for request for pain meds.

## 2012-06-24 NOTE — Progress Notes (Signed)
Detail u/s done here last wk.  All normal S: CC: uri sx c cough x 3 days , no fever,  Hx childhood asthma.  Pex:  insp Wheezes, r>l , no ronchi.  A Uri/,Asthma Plan: Hycodan, Albuterol inhaler.Claritin(PRN).

## 2012-06-24 NOTE — Patient Instructions (Addendum)
Please stop the cigarrettes.  Use inhaler for airway disease.d Keep sched appt. To address back pain issues. Hx  Fibromyalgia. Sees Dr Cathlyn Parsons. Care.

## 2012-06-30 ENCOUNTER — Ambulatory Visit (INDEPENDENT_AMBULATORY_CARE_PROVIDER_SITE_OTHER): Payer: BC Managed Care – PPO | Admitting: Women's Health

## 2012-06-30 ENCOUNTER — Encounter: Payer: Self-pay | Admitting: Women's Health

## 2012-06-30 VITALS — BP 90/58 | Wt 182.0 lb

## 2012-06-30 DIAGNOSIS — Z331 Pregnant state, incidental: Secondary | ICD-10-CM

## 2012-06-30 DIAGNOSIS — Z3482 Encounter for supervision of other normal pregnancy, second trimester: Secondary | ICD-10-CM

## 2012-06-30 DIAGNOSIS — Z3480 Encounter for supervision of other normal pregnancy, unspecified trimester: Secondary | ICD-10-CM

## 2012-06-30 DIAGNOSIS — O99322 Drug use complicating pregnancy, second trimester: Secondary | ICD-10-CM

## 2012-06-30 DIAGNOSIS — Z1389 Encounter for screening for other disorder: Secondary | ICD-10-CM

## 2012-06-30 LAB — POCT URINALYSIS DIPSTICK
Blood, UA: NEGATIVE
Glucose, UA: NEGATIVE
Nitrite, UA: NEGATIVE

## 2012-06-30 NOTE — Progress Notes (Signed)
Every now and again, she has pressure.

## 2012-06-30 NOTE — Patient Instructions (Signed)
Pregnancy - Second Trimester The second trimester of pregnancy (3 to 6 months) is a period of rapid growth for you and your baby. At the end of the sixth month, your baby is about 9 inches long and weighs 1 1/2 pounds. You will begin to feel the baby move between 18 and 20 weeks of the pregnancy. This is called quickening. Weight gain is faster. A clear fluid (colostrum) may leak out of your breasts. You may feel small contractions of the womb (uterus). This is known as false labor or Braxton-Hicks contractions. This is like a practice for labor when the baby is ready to be born. Usually, the problems with morning sickness have usually passed by the end of your first trimester. Some women develop small dark blotches (called cholasma, mask of pregnancy) on their face that usually goes away after the baby is born. Exposure to the sun makes the blotches worse. Acne may also develop in some pregnant women and pregnant women who have acne, may find that it goes away. PRENATAL EXAMS  Blood work may continue to be done during prenatal exams. These tests are done to check on your health and the probable health of your baby. Blood work is used to follow your blood levels (hemoglobin). Anemia (low hemoglobin) is common during pregnancy. Iron and vitamins are given to help prevent this. You will also be checked for diabetes between 24 and 28 weeks of the pregnancy. Some of the previous blood tests may be repeated.  The size of the uterus is measured during each visit. This is to make sure that the baby is continuing to grow properly according to the dates of the pregnancy.  Your blood pressure is checked every prenatal visit. This is to make sure you are not getting toxemia.  Your urine is checked to make sure you do not have an infection, diabetes or protein in the urine.  Your weight is checked often to make sure gains are happening at the suggested rate. This is to ensure that both you and your baby are  growing normally.  Sometimes, an ultrasound is performed to confirm the proper growth and development of the baby. This is a test which bounces harmless sound waves off the baby so your caregiver can more accurately determine due dates. Sometimes, a test is done on the amniotic fluid surrounding the baby. This test is called an amniocentesis. The amniotic fluid is obtained by sticking a needle into the belly (abdomen). This is done to check the chromosomes in instances where there is a concern about possible genetic problems with the baby. It is also sometimes done near the end of pregnancy if an early delivery is required. In this case, it is done to help make sure the baby's lungs are mature enough for the baby to live outside of the womb. CHANGES OCCURING IN THE SECOND TRIMESTER OF PREGNANCY Your body goes through many changes during pregnancy. They vary from person to person. Talk to your caregiver about changes you notice that you are concerned about.  During the second trimester, you will likely have an increase in your appetite. It is normal to have cravings for certain foods. This varies from person to person and pregnancy to pregnancy.  Your lower abdomen will begin to bulge.  You may have to urinate more often because the uterus and baby are pressing on your bladder. It is also common to get more bladder infections during pregnancy. You can help this by drinking lots of fluids   and emptying your bladder before and after intercourse.  You may begin to get stretch marks on your hips, abdomen, and breasts. These are normal changes in the body during pregnancy. There are no exercises or medicines to take that prevent this change.  You may begin to develop swollen and bulging veins (varicose veins) in your legs. Wearing support hose, elevating your feet for 15 minutes, 3 to 4 times a day and limiting salt in your diet helps lessen the problem.  Heartburn may develop as the uterus grows and  pushes up against the stomach. Antacids recommended by your caregiver helps with this problem. Also, eating smaller meals 4 to 5 times a day helps.  Constipation can be treated with a stool softener or adding bulk to your diet. Drinking lots of fluids, and eating vegetables, fruits, and whole grains are helpful.  Exercising is also helpful. If you have been very active up until your pregnancy, most of these activities can be continued during your pregnancy. If you have been less active, it is helpful to start an exercise program such as walking.  Hemorrhoids may develop at the end of the second trimester. Warm sitz baths and hemorrhoid cream recommended by your caregiver helps hemorrhoid problems.  Backaches may develop during this time of your pregnancy. Avoid heavy lifting, wear low heal shoes, and practice good posture to help with backache problems.  Some pregnant women develop tingling and numbness of their hand and fingers because of swelling and tightening of ligaments in the wrist (carpel tunnel syndrome). This goes away after the baby is born.  As your breasts enlarge, you may have to get a bigger bra. Get a comfortable, cotton, support bra. Do not get a nursing bra until the last month of the pregnancy if you will be nursing the baby.  You may get a dark line from your belly button to the pubic area called the linea nigra.  You may develop rosy cheeks because of increase blood flow to the face.  You may develop spider looking lines of the face, neck, arms, and chest. These go away after the baby is born. HOME CARE INSTRUCTIONS   It is extremely important to avoid all smoking, herbs, alcohol, and unprescribed drugs during your pregnancy. These chemicals affect the formation and growth of the baby. Avoid these chemicals throughout the pregnancy to ensure the delivery of a healthy infant.  Most of your home care instructions are the same as suggested for the first trimester of your  pregnancy. Keep your caregiver's appointments. Follow your caregiver's instructions regarding medicine use, exercise, and diet.  During pregnancy, you are providing food for you and your baby. Continue to eat regular, well-balanced meals. Choose foods such as meat, fish, milk and other low fat dairy products, vegetables, fruits, and whole-grain breads and cereals. Your caregiver will tell you of the ideal weight gain.  A physical sexual relationship may be continued up until near the end of pregnancy if there are no other problems. Problems could include early (premature) leaking of amniotic fluid from the membranes, vaginal bleeding, abdominal pain, or other medical or pregnancy problems.  Exercise regularly if there are no restrictions. Check with your caregiver if you are unsure of the safety of some of your exercises. The greatest weight gain will occur in the last 2 trimesters of pregnancy. Exercise will help you:  Control your weight.  Get you in shape for labor and delivery.  Lose weight after you have the baby.  Wear   a good support or jogging bra for breast tenderness during pregnancy. This may help if worn during sleep. Pads or tissues may be used in the bra if you are leaking colostrum.  Do not use hot tubs, steam rooms or saunas throughout the pregnancy.  Wear your seat belt at all times when driving. This protects you and your baby if you are in an accident.  Avoid raw meat, uncooked cheese, cat litter boxes, and soil used by cats. These carry germs that can cause birth defects in the baby.  The second trimester is also a good time to visit your dentist for your dental health if this has not been done yet. Getting your teeth cleaned is okay. Use a soft toothbrush. Brush gently during pregnancy.  It is easier to leak urine during pregnancy. Tightening up and strengthening the pelvic muscles will help with this problem. Practice stopping your urination while you are going to the  bathroom. These are the same muscles you need to strengthen. It is also the muscles you would use as if you were trying to stop from passing gas. You can practice tightening these muscles up 10 times a set and repeating this about 3 times per day. Once you know what muscles to tighten up, do not perform these exercises during urination. It is more likely to contribute to an infection by backing up the urine.  Ask for help if you have financial, counseling, or nutritional needs during pregnancy. Your caregiver will be able to offer counseling for these needs as well as refer you for other special needs.  Your skin may become oily. If so, wash your face with mild soap, use non-greasy moisturizer and oil or cream based makeup. MEDICINES AND DRUG USE IN PREGNANCY  Take prenatal vitamins as directed. The vitamin should contain 1 milligram of folic acid. Keep all vitamins out of reach of children. Only a couple vitamins or tablets containing iron may be fatal to a baby or young child when ingested.  Avoid use of all medicines, including herbs, over-the-counter medicines, not prescribed or suggested by your caregiver. Only take over-the-counter or prescription medicines for pain, discomfort, or fever as directed by your caregiver. Do not use aspirin.  Let your caregiver also know about herbs you may be using.  Alcohol is related to a number of birth defects. This includes fetal alcohol syndrome. All alcohol, in any form, should be avoided completely. Smoking will cause low birth rate and premature babies.  Street or illegal drugs are very harmful to the baby. They are absolutely forbidden. A baby born to an addicted mother will be addicted at birth. The baby will go through the same withdrawal an adult does. SEEK MEDICAL CARE IF:  You have any concerns or worries during your pregnancy. It is better to call with your questions if you feel they cannot wait, rather than worry about them. SEEK IMMEDIATE  MEDICAL CARE IF:   An unexplained oral temperature above 102 F (38.9 C) develops, or as your caregiver suggests.  You have leaking of fluid from the vagina (birth canal). If leaking membranes are suspected, take your temperature and tell your caregiver of this when you call.  There is vaginal spotting, bleeding, or passing clots. Tell your caregiver of the amount and how many pads are used. Light spotting in pregnancy is common, especially following intercourse.  You develop a bad smelling vaginal discharge with a change in the color from clear to white.  You continue to feel   sick to your stomach (nauseated) and have no relief from remedies suggested. You vomit blood or coffee ground-like materials.  You lose more than 2 pounds of weight or gain more than 2 pounds of weight over 1 week, or as suggested by your caregiver.  You notice swelling of your face, hands, feet, or legs.  You get exposed to German measles and have never had them.  You are exposed to fifth disease or chickenpox.  You develop belly (abdominal) pain. Round ligament discomfort is a common non-cancerous (benign) cause of abdominal pain in pregnancy. Your caregiver still must evaluate you.  You develop a bad headache that does not go away.  You develop fever, diarrhea, pain with urination, or shortness of breath.  You develop visual problems, blurry, or double vision.  You fall or are in a car accident or any kind of trauma.  There is mental or physical violence at home. Document Released: 12/25/2000 Document Revised: 09/25/2011 Document Reviewed: 06/29/2008 ExitCare Patient Information 2014 [employer].  

## 2012-06-30 NOTE — Progress Notes (Signed)
Reports good fm. Denies uc's, lof, vb, urinary frequency, urgency, hesitancy, or dysuria.  No complaints.  Reviewed ptl s/s, fm.  All questions answered. F/U in 3wks for visit.

## 2012-07-07 ENCOUNTER — Telehealth: Payer: Self-pay | Admitting: Obstetrics and Gynecology

## 2012-07-07 NOTE — Telephone Encounter (Signed)
Pt states was given Hycodan at last visit for cough, has finished medication and states Dr. Emelda Fear would give her hydrocodone for her chronic back pain after she finished the Hycodan. Pt informed Dr. Emelda Fear was out of office today, but would send message to him. If pt has not heard back from our office by Friday, June 27,2014, pt to call office back. Pt verbalized understanding.

## 2012-07-10 ENCOUNTER — Telehealth: Payer: Self-pay | Admitting: *Deleted

## 2012-07-10 NOTE — Telephone Encounter (Signed)
Pt states finished hycodan for cough and is now needed her hydrocodone for pain. Per Dr. Emelda Fear, pt needs to make an appt before prescribing anything for pain. Call transferred to front staff for an appt to made first of next week.

## 2012-07-13 ENCOUNTER — Ambulatory Visit (INDEPENDENT_AMBULATORY_CARE_PROVIDER_SITE_OTHER): Payer: BC Managed Care – PPO | Admitting: Obstetrics and Gynecology

## 2012-07-13 VITALS — BP 100/64 | Wt 182.8 lb

## 2012-07-13 DIAGNOSIS — O26849 Uterine size-date discrepancy, unspecified trimester: Secondary | ICD-10-CM

## 2012-07-13 DIAGNOSIS — Z3480 Encounter for supervision of other normal pregnancy, unspecified trimester: Secondary | ICD-10-CM

## 2012-07-13 DIAGNOSIS — F1920 Other psychoactive substance dependence, uncomplicated: Secondary | ICD-10-CM

## 2012-07-13 DIAGNOSIS — Z331 Pregnant state, incidental: Secondary | ICD-10-CM

## 2012-07-13 DIAGNOSIS — O99320 Drug use complicating pregnancy, unspecified trimester: Secondary | ICD-10-CM

## 2012-07-13 DIAGNOSIS — Z1389 Encounter for screening for other disorder: Secondary | ICD-10-CM

## 2012-07-13 DIAGNOSIS — O210 Mild hyperemesis gravidarum: Secondary | ICD-10-CM

## 2012-07-13 DIAGNOSIS — M797 Fibromyalgia: Secondary | ICD-10-CM

## 2012-07-13 LAB — POCT URINALYSIS DIPSTICK
Glucose, UA: NEGATIVE
Nitrite, UA: NEGATIVE

## 2012-07-13 MED ORDER — ONDANSETRON HCL 8 MG PO TABS: 8.0000 mg | ORAL_TABLET | Freq: Three times a day (TID) | ORAL | Status: DC | PRN

## 2012-07-13 MED ORDER — HYDROCODONE-ACETAMINOPHEN 5-325 MG PO TABS: 1.0000 | ORAL_TABLET | Freq: Four times a day (QID) | ORAL | Status: DC | PRN

## 2012-07-13 NOTE — Progress Notes (Signed)
Pt states has mid back pain due "disc hitting sciatic nerve" as per [place], had MRI,and HX of fibromyalgia. Requesting pain medication. Uses hot tub x 1 hr q a.m.  A:preg 23wk , Fibromyalgia.  P; Goal Limit HC to 20mg /d( 5/325 q 6h )

## 2012-07-22 ENCOUNTER — Encounter: Payer: BC Managed Care – PPO | Admitting: Obstetrics and Gynecology

## 2012-08-05 ENCOUNTER — Telehealth: Payer: Self-pay | Admitting: *Deleted

## 2012-08-05 NOTE — Telephone Encounter (Signed)
Left message x 1. JSY 

## 2012-08-05 NOTE — Telephone Encounter (Signed)
If patient does return prior to Monday she can recontact Dr Emelda Fear on Monday and ask what he would like to do

## 2012-08-05 NOTE — Telephone Encounter (Signed)
Spoke with pt. Has a family emergency and is leaving to go to [facility] in 3 hours. Not due to get Hydrocodone until Sunday but she don't think she will be back in town by then. She is asking for an early refill. What do you advise? JSY

## 2012-08-06 NOTE — Telephone Encounter (Signed)
Spoke with pt. Advised if she returns prior to Monday, contact Dr. Emelda Fear on Monday and see what he advises. Pt voiced understanding. JSY

## 2012-08-06 NOTE — Telephone Encounter (Signed)
[facility] from [facility] called. Pt had requested Hydrocodone early. Dr. Emelda Fear spoke with pharmacist and he authorized early refill. He told Pharmacist to let [facility] in [place] know she could not get refill from them on Sunday. JSY

## 2012-08-06 NOTE — Telephone Encounter (Signed)
Left message. JSY 

## 2012-08-07 ENCOUNTER — Telehealth: Payer: Self-pay | Admitting: Obstetrics and Gynecology

## 2012-08-07 NOTE — Telephone Encounter (Signed)
Spoke with pt. Phone lines not working at [facility] in [place]. Pt states she spoke with pharmacist at [facility] and was told if Dr. Emelda Fear would write a letter stating it was ok to refill Hydrocodone early, they could fill it. Spoke with Dr. Emelda Fear. Dr. Emelda Fear said he would not write the letter. Advised ER if she needs to get meds. Pt voiced understanding. Have had multiple phone calls about this. JSY

## 2012-08-10 ENCOUNTER — Encounter: Payer: BC Managed Care – PPO | Admitting: Women's Health

## 2012-08-10 ENCOUNTER — Other Ambulatory Visit: Payer: BC Managed Care – PPO

## 2012-08-19 ENCOUNTER — Other Ambulatory Visit: Payer: Self-pay | Admitting: Obstetrics and Gynecology

## 2012-08-20 ENCOUNTER — Telehealth: Payer: Self-pay | Admitting: Obstetrics & Gynecology

## 2012-08-20 DIAGNOSIS — Z331 Pregnant state, incidental: Secondary | ICD-10-CM

## 2012-08-20 MED ORDER — ONDANSETRON HCL 8 MG PO TABS: 8.0000 mg | ORAL_TABLET | Freq: Three times a day (TID) | ORAL | Status: DC | PRN

## 2012-08-20 NOTE — Telephone Encounter (Signed)
Pt requesting refills on Zofran. Last RX was 07/13/12 with 3 refills. Spoke with [facility] and they states pt did not have anymore refills.

## 2012-09-02 ENCOUNTER — Other Ambulatory Visit: Payer: BC Managed Care – PPO

## 2012-09-02 ENCOUNTER — Ambulatory Visit (INDEPENDENT_AMBULATORY_CARE_PROVIDER_SITE_OTHER): Payer: Medicaid Other | Admitting: Obstetrics and Gynecology

## 2012-09-02 VITALS — BP 94/60 | Wt 185.0 lb

## 2012-09-02 DIAGNOSIS — Z3480 Encounter for supervision of other normal pregnancy, unspecified trimester: Secondary | ICD-10-CM

## 2012-09-02 DIAGNOSIS — Z331 Pregnant state, incidental: Secondary | ICD-10-CM

## 2012-09-02 DIAGNOSIS — Z1389 Encounter for screening for other disorder: Secondary | ICD-10-CM

## 2012-09-02 LAB — RPR

## 2012-09-02 LAB — CBC
Platelets: 260 10*3/uL (ref 150–400)
RBC: 3.84 MIL/uL — ABNORMAL LOW (ref 3.87–5.11)
WBC: 12.6 10*3/uL — ABNORMAL HIGH (ref 4.0–10.5)

## 2012-09-02 NOTE — Progress Notes (Signed)
No complaints at this time. + nitrates in urine.

## 2012-09-02 NOTE — Patient Instructions (Signed)
Preventing Preterm Labor Preterm labor is when a pregnant woman has contractions that cause the cervix to open, shorten, and thin before 37 weeks of pregnancy. You will have regular contractions (tightening) 2 to 3 minutes apart. This usually causes discomfort or pain. HOME CARE  Eat a healthy diet.  Take your vitamins as told by your doctor.  Drink enough fluids to keep your pee (urine) clear or pale yellow every day.  Get rest and sleep.  Do not have sex if you are at high risk for preterm labor.  Follow your doctor's advice about activity, medicines, and tests.  Avoid stress.  Avoid hard labor or exercise that lasts for a long time.  Do not smoke. GET HELP RIGHT AWAY IF:   You are having contractions.  You have belly (abdominal) pain.  You have bleeding from your vagina.  You have pain when you pee (urinate).  You have abnormal discharge from your vagina.  You have a temperature by mouth above 102 F (38.9 C). MAKE SURE YOU:  Understand these instructions.  Will watch your condition.  Will get help if you are not doing well or get worse. Document Released: 03/29/2008 Document Revised: 03/25/2011 Document Reviewed: 03/29/2008 ExitCare Patient Information 2014 [employer].  

## 2012-09-02 NOTE — Progress Notes (Addendum)
1.Fibromyalgia/chronic pain:  Stable on Hydrocodone 5mg /6hr 2. THC use: denies since 1st trimester: P:  Test q trimester "poc" 3. Good FM, PN2 labs today 4. U/A + leukocytes, Nitrates, no uti sx, no d/c :P: check C&S

## 2012-09-02 NOTE — Addendum Note (Signed)
Addended by: Tilda Burrow on: 09/02/2012 10:21 AM   Modules accepted: Orders

## 2012-09-03 ENCOUNTER — Encounter: Payer: Self-pay | Admitting: Obstetrics and Gynecology

## 2012-09-03 LAB — GLUCOSE TOLERANCE, 2 HOURS W/ 1HR
Glucose, 1 hour: 125 mg/dL (ref 70–170)
Glucose, Fasting: 82 mg/dL (ref 70–99)

## 2012-09-03 LAB — DRUG SCREEN, URINE, NO CONFIRMATION
Barbiturate Quant, Ur: NEGATIVE
Creatinine,U: 57.2 mg/dL
Opiate Screen, Urine: NEGATIVE
Phencyclidine (PCP): NEGATIVE
Propoxyphene: NEGATIVE

## 2012-09-05 LAB — CULTURE, URINE COMPREHENSIVE

## 2012-09-06 ENCOUNTER — Other Ambulatory Visit: Payer: Self-pay | Admitting: Obstetrics and Gynecology

## 2012-09-06 MED ORDER — NITROFURANTOIN MACROCRYSTAL 100 MG PO CAPS: 100.0000 mg | ORAL_CAPSULE | Freq: Four times a day (QID) | ORAL | Status: DC

## 2012-09-07 ENCOUNTER — Other Ambulatory Visit: Payer: Self-pay | Admitting: Obstetrics and Gynecology

## 2012-09-07 ENCOUNTER — Telehealth: Payer: Self-pay | Admitting: Obstetrics and Gynecology

## 2012-09-07 MED ORDER — NITROFURANTOIN MACROCRYSTAL 100 MG PO CAPS: 100.0000 mg | ORAL_CAPSULE | Freq: Two times a day (BID) | ORAL | Status: DC

## 2012-09-07 NOTE — Telephone Encounter (Signed)
Left message telling pt urine culture showed UTI. Order for Macrodantin sent to [facility] in [place]. JSY

## 2012-09-08 ENCOUNTER — Telehealth: Payer: Self-pay | Admitting: Obstetrics and Gynecology

## 2012-09-08 NOTE — Telephone Encounter (Signed)
Pt informed of WNL glucose tolerance test.  

## 2012-09-16 ENCOUNTER — Telehealth: Payer: Self-pay | Admitting: *Deleted

## 2012-09-16 NOTE — Telephone Encounter (Signed)
Attempted to contact pt to inform ABX called in per Dr. Emelda Fear for positive urine culture.

## 2012-09-17 ENCOUNTER — Other Ambulatory Visit: Payer: Self-pay | Admitting: Adult Health

## 2012-09-21 ENCOUNTER — Telehealth: Payer: Self-pay | Admitting: *Deleted

## 2012-09-21 ENCOUNTER — Other Ambulatory Visit: Payer: Self-pay | Admitting: Obstetrics & Gynecology

## 2012-09-21 NOTE — Telephone Encounter (Signed)
Attempted to contact pt x 2 of abx escribed.

## 2012-09-21 NOTE — Telephone Encounter (Signed)
Pt notified of RX for Macrodantin e-scribed.   Pt requesting refills for Zofran 8 mg and Gabapentin 300mg  TID.

## 2012-09-22 MED ORDER — ONDANSETRON HCL 8 MG PO TABS: ORAL_TABLET | ORAL | Status: DC

## 2012-09-22 MED ORDER — GABAPENTIN 300 MG PO CAPS: 300.0000 mg | ORAL_CAPSULE | Freq: Three times a day (TID) | ORAL | Status: DC

## 2012-09-22 NOTE — Telephone Encounter (Signed)
Left message telling pt meds, Gabapentin and Zofran, had been e prescribed to [facility] in [place]. JSY

## 2012-09-30 ENCOUNTER — Ambulatory Visit (INDEPENDENT_AMBULATORY_CARE_PROVIDER_SITE_OTHER): Payer: Medicaid Other | Admitting: Advanced Practice Midwife

## 2012-09-30 ENCOUNTER — Other Ambulatory Visit: Payer: Self-pay | Admitting: Obstetrics and Gynecology

## 2012-09-30 ENCOUNTER — Encounter: Payer: Self-pay | Admitting: Advanced Practice Midwife

## 2012-09-30 ENCOUNTER — Telehealth: Payer: Self-pay | Admitting: Advanced Practice Midwife

## 2012-09-30 VITALS — BP 100/70 | Wt 182.0 lb

## 2012-09-30 DIAGNOSIS — Z3483 Encounter for supervision of other normal pregnancy, third trimester: Secondary | ICD-10-CM

## 2012-09-30 DIAGNOSIS — O2390 Unspecified genitourinary tract infection in pregnancy, unspecified trimester: Secondary | ICD-10-CM

## 2012-09-30 DIAGNOSIS — N390 Urinary tract infection, site not specified: Secondary | ICD-10-CM

## 2012-09-30 DIAGNOSIS — Z1389 Encounter for screening for other disorder: Secondary | ICD-10-CM

## 2012-09-30 DIAGNOSIS — F1121 Opioid dependence, in remission: Secondary | ICD-10-CM

## 2012-09-30 DIAGNOSIS — Z331 Pregnant state, incidental: Secondary | ICD-10-CM

## 2012-09-30 HISTORY — DX: Opioid dependence, in remission: F11.21

## 2012-09-30 LAB — POCT URINALYSIS DIPSTICK
Blood, UA: NEGATIVE
Ketones, UA: NEGATIVE

## 2012-09-30 MED ORDER — SULFAMETHOXAZOLE-TMP DS 800-160 MG PO TABS: 1.0000 | ORAL_TABLET | Freq: Two times a day (BID) | ORAL | Status: DC

## 2012-09-30 MED ORDER — ACYCLOVIR 400 MG PO TABS: 400.0000 mg | ORAL_TABLET | Freq: Three times a day (TID) | ORAL | Status: DC

## 2012-09-30 NOTE — Progress Notes (Signed)
"  I eat all the time".   Took macrobid for UTI 3 weeks ago, but culture is only sensitive to sulfa.  Septra DS BID for 5 days prescribed (benefits outweigh the risks).  Pt was started on Hydrocodone 5/325 # 60 q 14 days back in June for back pain/fibromyalgia.  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  Requested refill today.  I declined to refill it (too early anyway) and discussed addiction dangers to both her and the baby. Referral to [facility] made Size < dates:  Check EFW/AFI.Routine questions about pregnancy answered.

## 2012-10-01 ENCOUNTER — Telehealth: Payer: Self-pay | Admitting: *Deleted

## 2012-10-01 NOTE — Telephone Encounter (Signed)
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 Pt verbalized understanding and requested meds for cough, no fever, no mucus. Informed pt will discuss with Rodena Piety, CNM and call pt back.

## 2012-10-02 NOTE — Telephone Encounter (Signed)
Left message x 1. JSY 

## 2012-10-03 LAB — URINE CULTURE: Colony Count: >=100000

## 2012-10-05 ENCOUNTER — Other Ambulatory Visit: Payer: Self-pay | Admitting: Obstetrics and Gynecology

## 2012-10-05 NOTE — Telephone Encounter (Signed)
Pt requesting pain medication for pain related to fibromyalgia. Pt states Dr. Emelda Fear has filled in the past. Pt saw Maurine Cane, CNM on 09/30/2012 and refill declined because it was to early. Pt requesting Dr. Emelda Fear to fill hydrocodone.

## 2012-10-06 NOTE — Telephone Encounter (Signed)
Pt called to update mobile number, informed refill request for Hydrocodone sent to Dr. Emelda Fear yesterday. Dr. Emelda Fear out of office this am due to surgery schedule.

## 2012-10-08 ENCOUNTER — Ambulatory Visit (INDEPENDENT_AMBULATORY_CARE_PROVIDER_SITE_OTHER): Payer: Medicaid Other | Admitting: Obstetrics & Gynecology

## 2012-10-08 ENCOUNTER — Other Ambulatory Visit: Payer: Self-pay | Admitting: Advanced Practice Midwife

## 2012-10-08 ENCOUNTER — Encounter: Payer: Medicaid Other | Admitting: Advanced Practice Midwife

## 2012-10-08 ENCOUNTER — Other Ambulatory Visit: Payer: Self-pay | Admitting: Obstetrics & Gynecology

## 2012-10-08 ENCOUNTER — Ambulatory Visit (INDEPENDENT_AMBULATORY_CARE_PROVIDER_SITE_OTHER): Payer: Medicaid Other

## 2012-10-08 VITALS — BP 92/60 | Wt 178.0 lb

## 2012-10-08 DIAGNOSIS — O365990 Maternal care for other known or suspected poor fetal growth, unspecified trimester, not applicable or unspecified: Secondary | ICD-10-CM

## 2012-10-08 DIAGNOSIS — O365931 Maternal care for other known or suspected poor fetal growth, third trimester, fetus 1: Secondary | ICD-10-CM

## 2012-10-08 DIAGNOSIS — IMO0002 Reserved for concepts with insufficient information to code with codable children: Secondary | ICD-10-CM

## 2012-10-08 DIAGNOSIS — F1920 Other psychoactive substance dependence, uncomplicated: Secondary | ICD-10-CM

## 2012-10-08 DIAGNOSIS — Z1389 Encounter for screening for other disorder: Secondary | ICD-10-CM

## 2012-10-08 DIAGNOSIS — O26849 Uterine size-date discrepancy, unspecified trimester: Secondary | ICD-10-CM

## 2012-10-08 DIAGNOSIS — Z331 Pregnant state, incidental: Secondary | ICD-10-CM

## 2012-10-08 DIAGNOSIS — Z3483 Encounter for supervision of other normal pregnancy, third trimester: Secondary | ICD-10-CM

## 2012-10-08 LAB — POCT URINALYSIS DIPSTICK: Glucose, UA: NEGATIVE

## 2012-10-08 NOTE — Progress Notes (Signed)
C/o irregular contractions, ultrasound today

## 2012-10-08 NOTE — Progress Notes (Signed)
U/S(36+0wks)-vtx active fetus, BPP 8/8, fluid wnl AFI-9.9cm, post gr 1 plac, UA Doppler RI-0.63 & 0.61, FHR=133 bpm, EFW 4 lb 15 oz (10th%Tile), female fetus

## 2012-10-08 NOTE — Progress Notes (Signed)
See sonogram report and recommendations BP weight and urine results all reviewed and noted. Patient reports good fetal movement, denies any bleeding and no rupture of membranes symptoms or regular contractions. Patient is without complaints. All questions were answered.

## 2012-10-11 ENCOUNTER — Other Ambulatory Visit: Payer: Self-pay | Admitting: Obstetrics & Gynecology

## 2012-10-12 ENCOUNTER — Ambulatory Visit (INDEPENDENT_AMBULATORY_CARE_PROVIDER_SITE_OTHER): Payer: Medicaid Other | Admitting: Obstetrics & Gynecology

## 2012-10-12 ENCOUNTER — Encounter: Payer: Self-pay | Admitting: Obstetrics & Gynecology

## 2012-10-12 VITALS — BP 92/62 | Wt 183.5 lb

## 2012-10-12 DIAGNOSIS — O365990 Maternal care for other known or suspected poor fetal growth, unspecified trimester, not applicable or unspecified: Secondary | ICD-10-CM

## 2012-10-12 DIAGNOSIS — O09213 Supervision of pregnancy with history of pre-term labor, third trimester: Secondary | ICD-10-CM

## 2012-10-12 DIAGNOSIS — Z1389 Encounter for screening for other disorder: Secondary | ICD-10-CM

## 2012-10-12 DIAGNOSIS — F1920 Other psychoactive substance dependence, uncomplicated: Secondary | ICD-10-CM

## 2012-10-12 DIAGNOSIS — IMO0002 Reserved for concepts with insufficient information to code with codable children: Secondary | ICD-10-CM

## 2012-10-12 DIAGNOSIS — Z331 Pregnant state, incidental: Secondary | ICD-10-CM

## 2012-10-12 DIAGNOSIS — O0993 Supervision of high risk pregnancy, unspecified, third trimester: Secondary | ICD-10-CM

## 2012-10-12 LAB — POCT URINALYSIS DIPSTICK
Glucose, UA: NEGATIVE
Ketones, UA: NEGATIVE
Leukocytes, UA: NEGATIVE
Nitrite, UA: NEGATIVE

## 2012-10-12 NOTE — Progress Notes (Signed)
Reactive NST BP weight and urine results all reviewed and noted. Patient reports good fetal movement, denies any bleeding and no rupture of membranes symptoms or regular contractions. Patient is without complaints. All questions were answered.  

## 2012-10-12 NOTE — Addendum Note (Signed)
Addended by: Colen Darling on: 10/12/2012 12:25 PM   Modules accepted: Orders

## 2012-10-13 ENCOUNTER — Telehealth: Payer: Self-pay | Admitting: Obstetrics & Gynecology

## 2012-10-13 LAB — GC/CHLAMYDIA PROBE AMP: GC Probe RNA: NEGATIVE

## 2012-10-13 NOTE — Telephone Encounter (Signed)
Pt requesting office to call [facility] pharmacy for her to pick up Gabapentin now. Scott at [facility] pt not due to refill until 10/28/2012 Dr. Wilmer Floor. Please advise.

## 2012-10-15 ENCOUNTER — Ambulatory Visit (INDEPENDENT_AMBULATORY_CARE_PROVIDER_SITE_OTHER): Payer: Medicaid Other | Admitting: Advanced Practice Midwife

## 2012-10-15 ENCOUNTER — Other Ambulatory Visit: Payer: Self-pay | Admitting: Obstetrics & Gynecology

## 2012-10-15 ENCOUNTER — Encounter: Payer: Self-pay | Admitting: Advanced Practice Midwife

## 2012-10-15 ENCOUNTER — Other Ambulatory Visit: Payer: Medicaid Other | Admitting: Obstetrics & Gynecology

## 2012-10-15 ENCOUNTER — Ambulatory Visit (INDEPENDENT_AMBULATORY_CARE_PROVIDER_SITE_OTHER): Payer: Medicaid Other

## 2012-10-15 VITALS — BP 90/60 | Wt 182.0 lb

## 2012-10-15 DIAGNOSIS — IMO0002 Reserved for concepts with insufficient information to code with codable children: Secondary | ICD-10-CM

## 2012-10-15 DIAGNOSIS — O365990 Maternal care for other known or suspected poor fetal growth, unspecified trimester, not applicable or unspecified: Secondary | ICD-10-CM

## 2012-10-15 DIAGNOSIS — N390 Urinary tract infection, site not specified: Secondary | ICD-10-CM

## 2012-10-15 DIAGNOSIS — Z23 Encounter for immunization: Secondary | ICD-10-CM

## 2012-10-15 DIAGNOSIS — Z1389 Encounter for screening for other disorder: Secondary | ICD-10-CM

## 2012-10-15 DIAGNOSIS — F1920 Other psychoactive substance dependence, uncomplicated: Secondary | ICD-10-CM

## 2012-10-15 DIAGNOSIS — Z3482 Encounter for supervision of other normal pregnancy, second trimester: Secondary | ICD-10-CM

## 2012-10-15 DIAGNOSIS — Z331 Pregnant state, incidental: Secondary | ICD-10-CM

## 2012-10-15 LAB — POCT URINALYSIS DIPSTICK
Nitrite, UA: POSITIVE
Protein, UA: NEGATIVE

## 2012-10-15 LAB — CULTURE, BETA STREP (GROUP B ONLY)

## 2012-10-15 MED ORDER — INFLUENZA VAC SPLIT QUAD 0.5 ML IM SUSP
0.5000 mL | Freq: Once | INTRAMUSCULAR | Status: AC
  Administered 2012-10-15: 0.5 mL via INTRAMUSCULAR

## 2012-10-15 NOTE — Progress Notes (Signed)
Got flu shot today. Denies problems.  Doing OK off of narcotics.  Will continue Monday NST's and Thurday u/s.

## 2012-10-15 NOTE — Telephone Encounter (Signed)
Pt has an appt today with Dr.Eure.

## 2012-10-15 NOTE — Progress Notes (Signed)
U/S(37+0wks)-vtx active fetus BPP 8/8, fluid wnl AFI-8.3cm, post gr 1 plac, UA Doppler RI-0.65 & 0.59

## 2012-10-16 ENCOUNTER — Other Ambulatory Visit: Payer: Self-pay | Admitting: Obstetrics & Gynecology

## 2012-10-16 DIAGNOSIS — O365931 Maternal care for other known or suspected poor fetal growth, third trimester, fetus 1: Secondary | ICD-10-CM

## 2012-10-17 LAB — URINE CULTURE

## 2012-10-18 NOTE — Telephone Encounter (Signed)
No refil til seen

## 2012-10-19 ENCOUNTER — Other Ambulatory Visit: Payer: Medicaid Other | Admitting: Obstetrics & Gynecology

## 2012-10-20 ENCOUNTER — Telehealth: Payer: Self-pay | Admitting: Obstetrics & Gynecology

## 2012-10-20 MED ORDER — ONDANSETRON HCL 8 MG PO TABS: 8.0000 mg | ORAL_TABLET | Freq: Three times a day (TID) | ORAL | Status: DC | PRN

## 2012-10-20 NOTE — Telephone Encounter (Signed)
Pt requesting refill for Zofran, last filled 10/11/2012 # 20.

## 2012-10-21 ENCOUNTER — Encounter: Payer: Self-pay | Admitting: Advanced Practice Midwife

## 2012-10-21 ENCOUNTER — Ambulatory Visit (INDEPENDENT_AMBULATORY_CARE_PROVIDER_SITE_OTHER): Payer: Medicaid Other | Admitting: Advanced Practice Midwife

## 2012-10-21 VITALS — BP 80/60 | Wt 179.0 lb

## 2012-10-21 DIAGNOSIS — Z1389 Encounter for screening for other disorder: Secondary | ICD-10-CM

## 2012-10-21 DIAGNOSIS — Z3482 Encounter for supervision of other normal pregnancy, second trimester: Secondary | ICD-10-CM

## 2012-10-21 DIAGNOSIS — O365990 Maternal care for other known or suspected poor fetal growth, unspecified trimester, not applicable or unspecified: Secondary | ICD-10-CM

## 2012-10-21 DIAGNOSIS — F1920 Other psychoactive substance dependence, uncomplicated: Secondary | ICD-10-CM

## 2012-10-21 DIAGNOSIS — Z331 Pregnant state, incidental: Secondary | ICD-10-CM

## 2012-10-21 DIAGNOSIS — O2343 Unspecified infection of urinary tract in pregnancy, third trimester: Secondary | ICD-10-CM

## 2012-10-21 DIAGNOSIS — O2390 Unspecified genitourinary tract infection in pregnancy, unspecified trimester: Secondary | ICD-10-CM

## 2012-10-21 LAB — POCT URINALYSIS DIPSTICK
Blood, UA: NEGATIVE
Glucose, UA: NEGATIVE
Ketones, UA: NEGATIVE

## 2012-10-21 MED ORDER — VALACYCLOVIR HCL 1 G PO TABS: 500.0000 mg | ORAL_TABLET | Freq: Two times a day (BID) | ORAL | Status: DC

## 2012-10-21 MED ORDER — CEPHALEXIN 500 MG PO CAPS: 500.0000 mg | ORAL_CAPSULE | Freq: Three times a day (TID) | ORAL | Status: DC

## 2012-10-21 NOTE — Patient Instructions (Signed)
If you are still pregnant on 10/16 at 7am, come to [facility] ([address] in [place], [place]) to start your induction!  Eat a light meal before you come.  [name]!!

## 2012-10-21 NOTE — Addendum Note (Signed)
Addended by: Colen Darling on: 10/21/2012 11:33 AM   Modules accepted: Orders

## 2012-10-21 NOTE — Progress Notes (Signed)
For NST Today. 

## 2012-10-21 NOTE — Progress Notes (Signed)
Urine culture grew klebsiella resistant to Macrobid (same as before).  Was treated with Septra (s<20) which did not cure.  Pt has nausea with PCN. Will rx Keflex 500 TID X7.  Thinks acyclovir causes nausea. Will Change to Valtrex 500mg  BID. NSt today reactive. Missed NST Monday.  Will do NST Friday and Tues with IOL 10/16 at 0700.

## 2012-10-22 ENCOUNTER — Ambulatory Visit (INDEPENDENT_AMBULATORY_CARE_PROVIDER_SITE_OTHER): Payer: Medicaid Other

## 2012-10-22 ENCOUNTER — Encounter: Payer: Self-pay | Admitting: Obstetrics & Gynecology

## 2012-10-22 ENCOUNTER — Ambulatory Visit (INDEPENDENT_AMBULATORY_CARE_PROVIDER_SITE_OTHER): Payer: Medicaid Other | Admitting: Obstetrics & Gynecology

## 2012-10-22 VITALS — BP 86/60 | Wt 183.0 lb

## 2012-10-22 DIAGNOSIS — O365990 Maternal care for other known or suspected poor fetal growth, unspecified trimester, not applicable or unspecified: Secondary | ICD-10-CM

## 2012-10-22 DIAGNOSIS — Z1389 Encounter for screening for other disorder: Secondary | ICD-10-CM

## 2012-10-22 DIAGNOSIS — IMO0002 Reserved for concepts with insufficient information to code with codable children: Secondary | ICD-10-CM

## 2012-10-22 DIAGNOSIS — F1920 Other psychoactive substance dependence, uncomplicated: Secondary | ICD-10-CM

## 2012-10-22 DIAGNOSIS — O365931 Maternal care for other known or suspected poor fetal growth, third trimester, fetus 1: Secondary | ICD-10-CM

## 2012-10-22 DIAGNOSIS — Z331 Pregnant state, incidental: Secondary | ICD-10-CM

## 2012-10-22 LAB — POCT URINALYSIS DIPSTICK
Blood, UA: NEGATIVE
Ketones, UA: NEGATIVE
Protein, UA: NEGATIVE

## 2012-10-22 NOTE — Progress Notes (Signed)
FOLLOW-UP U/S. 

## 2012-10-22 NOTE — Addendum Note (Signed)
Addended by: Criss Alvine on: 10/22/2012 12:07 PM   Modules accepted: Orders

## 2012-10-22 NOTE — Progress Notes (Signed)
Sonogram reviewed and report done.  Reassuring to continue until NST on Monday induction scheduled for 39 weeks, 10/29/2012. BP weight and urine results all reviewed and noted. Patient reports good fetal movement, denies any bleeding and no rupture of membranes symptoms or regular contractions. Patient is without complaints. All questions were answered.

## 2012-10-22 NOTE — Progress Notes (Signed)
U/S(38+0wks)-vtx active fetus, BPP 8/8, EFW 5 lb (<3rd%tile [place], <10th%tile Williams), fluid wnl AFI-7.5cm, post gr 2 plac, UA Doppler RI-0.59 x2,

## 2012-10-26 ENCOUNTER — Ambulatory Visit (INDEPENDENT_AMBULATORY_CARE_PROVIDER_SITE_OTHER): Payer: Medicaid Other | Admitting: Women's Health

## 2012-10-26 ENCOUNTER — Encounter: Payer: Self-pay | Admitting: Women's Health

## 2012-10-26 ENCOUNTER — Telehealth (HOSPITAL_COMMUNITY): Payer: Self-pay | Admitting: *Deleted

## 2012-10-26 VITALS — BP 88/64 | Wt 181.5 lb

## 2012-10-26 DIAGNOSIS — Z331 Pregnant state, incidental: Secondary | ICD-10-CM

## 2012-10-26 DIAGNOSIS — O99323 Drug use complicating pregnancy, third trimester: Secondary | ICD-10-CM

## 2012-10-26 DIAGNOSIS — O99340 Other mental disorders complicating pregnancy, unspecified trimester: Secondary | ICD-10-CM

## 2012-10-26 DIAGNOSIS — O0993 Supervision of high risk pregnancy, unspecified, third trimester: Secondary | ICD-10-CM

## 2012-10-26 DIAGNOSIS — O365990 Maternal care for other known or suspected poor fetal growth, unspecified trimester, not applicable or unspecified: Secondary | ICD-10-CM

## 2012-10-26 DIAGNOSIS — F1920 Other psychoactive substance dependence, uncomplicated: Secondary | ICD-10-CM

## 2012-10-26 DIAGNOSIS — Z1389 Encounter for screening for other disorder: Secondary | ICD-10-CM

## 2012-10-26 LAB — POCT URINALYSIS DIPSTICK
Glucose, UA: NEGATIVE
Ketones, UA: NEGATIVE
Nitrite, UA: NEGATIVE

## 2012-10-26 NOTE — Progress Notes (Signed)
Reports good fm. Denies regular uc's, lof, vb, urinary frequency, urgency, hesitancy, or dysuria.  No complaints.  Reactive NST. Reviewed labor s/s, fkc.  All questions answered. IOL d/t IUGR on 10/16.

## 2012-10-26 NOTE — Patient Instructions (Signed)
Braxton Hicks Contractions Pregnancy is commonly associated with contractions of the uterus throughout the pregnancy. Towards the end of pregnancy (32 to 34 weeks), these contractions (Braxton Hicks) can develop more often and may become more forceful. This is not true labor because these contractions do not result in opening (dilatation) and thinning of the cervix. They are sometimes difficult to tell apart from true labor because these contractions can be forceful and people have different pain tolerances. You should not feel embarrassed if you go to the hospital with false labor. Sometimes, the only way to tell if you are in true labor is for your caregiver to follow the changes in the cervix. How to tell the difference between true and false labor:  False labor.  The contractions of false labor are usually shorter, irregular and not as hard as those of true labor.  They are often felt in the front of the lower abdomen and in the groin.  They may leave with walking around or changing positions while lying down.  They get weaker and are shorter lasting as time goes on.  These contractions are usually irregular.  They do not usually become progressively stronger, regular and closer together as with true labor.  True labor.  Contractions in true labor last 30 to 70 seconds, become very regular, usually become more intense, and increase in frequency.  They do not go away with walking.  The discomfort is usually felt in the top of the uterus and spreads to the lower abdomen and low back.  True labor can be determined by your caregiver with an exam. This will show that the cervix is dilating and getting thinner. If there are no prenatal problems or other health problems associated with the pregnancy, it is completely safe to be sent home with false labor and await the onset of true labor. HOME CARE INSTRUCTIONS   Keep up with your usual exercises and instructions.  Take medications as  directed.  Keep your regular prenatal appointment.  Eat and drink lightly if you think you are going into labor.  If BH contractions are making you uncomfortable:  Change your activity position from lying down or resting to walking/walking to resting.  Sit and rest in a tub of warm water.  Drink 2 to 3 glasses of water. Dehydration may cause B-H contractions.  Do slow and deep breathing several times an hour. SEEK IMMEDIATE MEDICAL CARE IF:   Your contractions continue to become stronger, more regular, and closer together.  You have a gushing, burst or leaking of fluid from the vagina.  An oral temperature above 102 F (38.9 C) develops.  You have passage of blood-tinged mucus.  You develop vaginal bleeding.  You develop continuous belly (abdominal) pain.  You have low back pain that you never had before.  You feel the baby's head pushing down causing pelvic pressure.  The baby is not moving as much as it used to. Document Released: 12/31/2004 Document Revised: 03/25/2011 Document Reviewed: 06/24/2008 [employer] Patient Information 2014 [employer].  

## 2012-10-26 NOTE — Addendum Note (Signed)
Addended by: Colen Darling on: 10/26/2012 04:47 PM   Modules accepted: Orders

## 2012-10-26 NOTE — Telephone Encounter (Signed)
Preadmission screen  

## 2012-10-27 ENCOUNTER — Encounter (HOSPITAL_COMMUNITY): Payer: Self-pay | Admitting: *Deleted

## 2012-10-27 ENCOUNTER — Telehealth (HOSPITAL_COMMUNITY): Payer: Self-pay | Admitting: *Deleted

## 2012-10-27 NOTE — Telephone Encounter (Signed)
Preadmission screen  

## 2012-10-29 ENCOUNTER — Encounter (HOSPITAL_COMMUNITY): Payer: Self-pay

## 2012-10-29 ENCOUNTER — Inpatient Hospital Stay (HOSPITAL_COMMUNITY): Payer: Medicaid Other | Admitting: Anesthesiology

## 2012-10-29 ENCOUNTER — Encounter (HOSPITAL_COMMUNITY): Payer: Medicaid Other | Admitting: Anesthesiology

## 2012-10-29 ENCOUNTER — Inpatient Hospital Stay (HOSPITAL_COMMUNITY)
Admission: RE | Admit: 2012-10-29 | Discharge: 2012-11-01 | DRG: 774 | Disposition: A | Discharge status: Home / Self Care | Payer: Medicaid Other | Source: Ambulatory Visit | Attending: Obstetrics & Gynecology | Admitting: Obstetrics & Gynecology

## 2012-10-29 VITALS — BP 105/69 | HR 90 | Temp 97.7°F | Resp 16 | Ht 67.0 in | Wt 178.0 lb

## 2012-10-29 DIAGNOSIS — O365990 Maternal care for other known or suspected poor fetal growth, unspecified trimester, not applicable or unspecified: Principal | ICD-10-CM | POA: Diagnosis present

## 2012-10-29 DIAGNOSIS — O210 Mild hyperemesis gravidarum: Secondary | ICD-10-CM

## 2012-10-29 DIAGNOSIS — O98519 Other viral diseases complicating pregnancy, unspecified trimester: Secondary | ICD-10-CM | POA: Diagnosis present

## 2012-10-29 DIAGNOSIS — F1121 Opioid dependence, in remission: Secondary | ICD-10-CM

## 2012-10-29 DIAGNOSIS — IMO0002 Reserved for concepts with insufficient information to code with codable children: Secondary | ICD-10-CM

## 2012-10-29 DIAGNOSIS — O99323 Drug use complicating pregnancy, third trimester: Secondary | ICD-10-CM

## 2012-10-29 DIAGNOSIS — A6000 Herpesviral infection of urogenital system, unspecified: Secondary | ICD-10-CM | POA: Diagnosis present

## 2012-10-29 DIAGNOSIS — O2343 Unspecified infection of urinary tract in pregnancy, third trimester: Secondary | ICD-10-CM

## 2012-10-29 DIAGNOSIS — O0993 Supervision of high risk pregnancy, unspecified, third trimester: Secondary | ICD-10-CM

## 2012-10-29 LAB — CBC
HCT: 34.8 % — ABNORMAL LOW (ref 36.0–46.0)
Hemoglobin: 12.4 g/dL (ref 12.0–15.0)
MCHC: 35.6 g/dL (ref 30.0–36.0)
RBC: 3.84 MIL/uL — ABNORMAL LOW (ref 3.87–5.11)
WBC: 11.6 10*3/uL — ABNORMAL HIGH (ref 4.0–10.5)

## 2012-10-29 LAB — RPR: RPR Ser Ql: NONREACTIVE

## 2012-10-29 MED ORDER — FENTANYL 2.5 MCG/ML BUPIVACAINE 1/10 % EPIDURAL INFUSION (WH - ANES)
14.0000 mL/h | INTRAMUSCULAR | Status: DC | PRN
  Administered 2012-10-29 – 2012-10-30 (×3): 14 mL/h via EPIDURAL
  Filled 2012-10-29 (×3): qty 125

## 2012-10-29 MED ORDER — VALACYCLOVIR HCL 500 MG PO TABS
500.0000 mg | ORAL_TABLET | Freq: Two times a day (BID) | ORAL | Status: DC
  Administered 2012-10-29 – 2012-10-30 (×2): 500 mg via ORAL
  Filled 2012-10-29 (×5): qty 1

## 2012-10-29 MED ORDER — LACTATED RINGERS IV SOLN
500.0000 mL | Freq: Once | INTRAVENOUS | Status: AC
  Administered 2012-10-29: 500 mL via INTRAVENOUS

## 2012-10-29 MED ORDER — TERBUTALINE SULFATE 1 MG/ML IJ SOLN: 0.2500 mg | Freq: Once | INTRAMUSCULAR | Status: AC | PRN

## 2012-10-29 MED ORDER — DIPHENHYDRAMINE HCL 50 MG/ML IJ SOLN: 12.5000 mg | INTRAMUSCULAR | Status: DC | PRN

## 2012-10-29 MED ORDER — FENTANYL CITRATE 0.05 MG/ML IJ SOLN
100.0000 ug | INTRAMUSCULAR | Status: DC | PRN
  Administered 2012-10-29: 100 ug via INTRAVENOUS
  Filled 2012-10-29: qty 2

## 2012-10-29 MED ORDER — PHENYLEPHRINE 40 MCG/ML (10ML) SYRINGE FOR IV PUSH (FOR BLOOD PRESSURE SUPPORT)
80.0000 ug | PREFILLED_SYRINGE | INTRAVENOUS | Status: DC | PRN
  Filled 2012-10-29: qty 5
  Filled 2012-10-29: qty 2

## 2012-10-29 MED ORDER — OXYTOCIN BOLUS FROM INFUSION: 500.0000 mL | INTRAVENOUS | Status: DC

## 2012-10-29 MED ORDER — OXYCODONE-ACETAMINOPHEN 5-325 MG PO TABS: 1.0000 | ORAL_TABLET | ORAL | Status: DC | PRN

## 2012-10-29 MED ORDER — MISOPROSTOL 25 MCG QUARTER TABLET
25.0000 ug | ORAL_TABLET | ORAL | Status: DC | PRN
  Administered 2012-10-29: 25 ug via VAGINAL
  Filled 2012-10-29: qty 1
  Filled 2012-10-29: qty 0.25

## 2012-10-29 MED ORDER — PHENYLEPHRINE 40 MCG/ML (10ML) SYRINGE FOR IV PUSH (FOR BLOOD PRESSURE SUPPORT)
80.0000 ug | PREFILLED_SYRINGE | INTRAVENOUS | Status: DC | PRN
  Administered 2012-10-29: 80 ug via INTRAVENOUS
  Filled 2012-10-29: qty 2

## 2012-10-29 MED ORDER — CITRIC ACID-SODIUM CITRATE 334-500 MG/5ML PO SOLN: 30.0000 mL | ORAL | Status: DC | PRN

## 2012-10-29 MED ORDER — SODIUM BICARBONATE 8.4 % IV SOLN
INTRAVENOUS | Status: DC | PRN
  Administered 2012-10-29: 5 mL via EPIDURAL

## 2012-10-29 MED ORDER — LACTATED RINGERS IV SOLN
500.0000 mL | INTRAVENOUS | Status: DC | PRN
  Administered 2012-10-29: 200 mL via INTRAVENOUS
  Administered 2012-10-30: 500 mL via INTRAVENOUS

## 2012-10-29 MED ORDER — EPHEDRINE 5 MG/ML INJ
10.0000 mg | INTRAVENOUS | Status: DC | PRN
  Administered 2012-10-29: 10 mg via INTRAVENOUS
  Filled 2012-10-29: qty 2

## 2012-10-29 MED ORDER — OXYTOCIN 40 UNITS IN LACTATED RINGERS INFUSION - SIMPLE MED
62.5000 mL/h | INTRAVENOUS | Status: DC
  Filled 2012-10-29: qty 1000

## 2012-10-29 MED ORDER — LACTATED RINGERS IV SOLN
INTRAVENOUS | Status: DC
  Administered 2012-10-29 – 2012-10-30 (×5): via INTRAVENOUS

## 2012-10-29 MED ORDER — FLEET ENEMA 7-19 GM/118ML RE ENEM: 1.0000 | ENEMA | RECTAL | Status: DC | PRN

## 2012-10-29 MED ORDER — ACETAMINOPHEN 325 MG PO TABS: 650.0000 mg | ORAL_TABLET | ORAL | Status: DC | PRN

## 2012-10-29 MED ORDER — EPHEDRINE 5 MG/ML INJ
10.0000 mg | INTRAVENOUS | Status: DC | PRN
  Filled 2012-10-29: qty 4
  Filled 2012-10-29: qty 2

## 2012-10-29 MED ORDER — ALBUTEROL SULFATE HFA 108 (90 BASE) MCG/ACT IN AERS
2.0000 | INHALATION_SPRAY | Freq: Four times a day (QID) | RESPIRATORY_TRACT | Status: DC | PRN
  Filled 2012-10-29: qty 6.7

## 2012-10-29 MED ORDER — ONDANSETRON HCL 4 MG/2ML IJ SOLN: 4.0000 mg | Freq: Four times a day (QID) | INTRAMUSCULAR | Status: DC | PRN

## 2012-10-29 MED ORDER — LIDOCAINE HCL (PF) 1 % IJ SOLN
30.0000 mL | INTRAMUSCULAR | Status: DC | PRN
  Filled 2012-10-29 (×2): qty 30

## 2012-10-29 MED ORDER — OXYTOCIN 40 UNITS IN LACTATED RINGERS INFUSION - SIMPLE MED
1.0000 m[IU]/min | INTRAVENOUS | Status: DC
  Administered 2012-10-29: 2 m[IU]/min via INTRAVENOUS
  Administered 2012-10-29: 14 m[IU]/min via INTRAVENOUS

## 2012-10-29 NOTE — H&P (Signed)
[name] is a 27 y.o. female presenting for IOL due to IUGR for 10th%tile. Recommended IOL by MFM. No acute issues. +FM, no LOF, no VB, No Ctx   Maternal Medical History:  Fetal activity: Perceived fetal activity is normal.   Last perceived fetal movement was within the past hour.    Prenatal complications: IUGR.   No bleeding, hypertension or pre-eclampsia.     OB History   Grav Para Term Preterm Abortions TAB SAB Ect Mult Living   3 2 2       2      Past Medical History  Diagnosis Date  . Other dysphagia   . Esophageal reflux   . Irritable bowel syndrome   . Hyperemesis gravidarum with metabolic disturbance, unspecified as to episode of care   . Anemia, unspecified   . Unspecified constipation   . Headache(784.0)   . Unspecified asthma(493.90)   . Gastroparesis   . Anxiety state, unspecified   . Other and unspecified noninfectious gastroenteritis and colitis(558.9)   . Nausea alone   . Abdominal pain, unspecified site   . Fibromyalgia   . Irritable bowel syndrome   . Fatty liver   . Pregnant   . Disc herniation     causes sciatica unsure which disc   Past Surgical History  Procedure Laterality Date  . Cholecystectomy    . Appendectomy     Family History: family history includes Cancer in her paternal grandfather and paternal grandmother; Colon polyps in her paternal grandfather; Diabetes in her father; Heart disease in her father; Hypertension in her father; Kidney disease in her father; Ulcerative colitis in her paternal grandmother. Social History:  reports that she has been smoking Cigarettes.  She has a 5 pack-year smoking history. She has never used smokeless tobacco. She reports that she does not drink alcohol or use illicit drugs.   Prenatal Transfer Tool  Maternal Diabetes: No Genetic Screening: Normal Maternal Ultrasounds/Referrals: Normal Fetal Ultrasounds or other Referrals:  None Maternal Substance Abuse:  Yes:  Type: Smoker Significant Maternal  Medications:  None Significant Maternal Lab Results:  None Other Comments:  Patient is prescribed PO hydrocodone for fibrmyalgia. She has not taken it for the past few weeks prior to delivery.  Review of Systems  Constitutional: Negative for fever and chills.  HENT: Negative for sore throat.   Eyes: Negative for blurred vision and photophobia.  Respiratory: Negative for cough, shortness of breath and wheezing.   Cardiovascular: Negative for chest pain and palpitations.  Gastrointestinal: Negative for heartburn, diarrhea and constipation.  Genitourinary: Negative for dysuria, urgency and frequency.  Musculoskeletal: Negative for joint pain and neck pain.  Skin: Negative.   Neurological: Negative for dizziness, tremors, weakness and headaches.    Dilation: 3 Effacement (%): 60 Station: -2 Exam by:: Cleone Slim RN  Blood pressure 107/69, pulse 86, temperature 98.4 F (36.9 C), temperature source Oral, resp. rate 18, height 5\' 7"  (1.702 m), weight 80.74 kg (178 lb). Maternal Exam:  Abdomen: Patient reports no abdominal tenderness. Fetal presentation: vertex  Introitus: Normal vulva. Vulva is negative for lesion and piercings.  Normal vagina.    Fetal Exam Fetal Monitor Review: Mode: fetoscope.   Baseline rate: 150.  Variability: moderate (6-25 bpm).   Pattern: accelerations present and no decelerations.    Fetal State Assessment: Category I - tracings are normal.     Physical Exam  Genitourinary: Vulva exhibits no lesion.    Prenatal labs: ABO, Rh: O/Positive/-- (03/05 0000) Antibody: NEG (  08/20 0920) Rubella: Immune (03/05 0000) RPR: NON REAC (08/20 0920)  HBsAg: Negative (03/05 0000)  HIV: NON REACTIVE (08/20 0920)  GBS: Negative (10/02 0000)   Assessment/Plan:  Ms. Arthurs is a [id number]. She presents for IOL for IUGR. She is comfortable.   -routine orders for L&D including anesthesia approved light-labor diet for the next 4 hours. -pt is good candidate for  vaginal birth  -expect NSVD  -HIV neg, GBS neg  -pain control with epidural   Post partum-  -pt will bottle feed. -wants BTL, papers are signed.   #Labor:IOL cytotec #Pain: epidural  #FWB: Cat I  #ID: GBS neg  #MOF: Bottle   Bing Plume 10/29/2012, 9:46 AM  I spoke with and examined patient and agree with PA-S's note and plan of care.  Tawana Scale, MD Ob Fellow 10/29/2012 10:31 PM

## 2012-10-29 NOTE — Progress Notes (Signed)
I spoke with and examined patient and agree with PA-S's note and plan of care.  Tawana Scale, MD Ob Fellow 10/29/2012 10:32 PM

## 2012-10-29 NOTE — Progress Notes (Signed)
[name] is a 27 y.o. G3P2002 at [redacted]w[redacted]d here for IOL for IUGR   Subjective: Comfortable with epidural. No acute issues at this time  Objective: BP 91/54  Pulse 70  Temp(Src) 97.9 F (36.6 C) (Axillary)  Resp 19  Ht 5\' 7"  (1.702 m)  Wt 80.74 kg (178 lb)  BMI 27.87 kg/m2  SpO2 98%  FHT:  FHR: 120 bpm, variability: moderate,  accelerations:  Present,  decelerations:  Absent UC:   Not currently tracing but likely continue to 2-3. readjusting SVE:   Dilation: 3 Effacement (%): 50 Station: -2 Exam by:: Dr Macon Large  Labs: Lab Results  Component Value Date   WBC 11.6* 10/29/2012   HGB 12.4 10/29/2012   HCT 34.8* 10/29/2012   MCV 90.6 10/29/2012   PLT 257 10/29/2012    Assessment / Plan: Induction of labor due to IUGR,  on pitocin  Labor: Continue Pitocin, currently at 14 mu/min Fetal Wellbeing:  Category I Pain Control:  Epidural I/D:  GBS negative Anticipated MOD:  NSVD  Minta Balsam, MD 10/29/2012, 11:45 PM

## 2012-10-29 NOTE — Progress Notes (Signed)
[name] is a 27 y.o. G3P2002 at [redacted]w[redacted]d here for IOL for IUGR   Subjective: Comfortable with epidural  Objective: BP 99/62  Pulse 63  Temp(Src) 97.9 F (36.6 C) (Axillary)  Resp 18  Ht 5\' 7"  (1.702 m)  Wt 178 lb (80.74 kg)  BMI 27.87 kg/m2  SpO2 98%  FHT:  FHR: 120 bpm, variability: moderate,  accelerations:  Present,  decelerations:  Absent UC:   regular, every 2-3 minutes SVE:   Dilation: 3 Effacement (%): 50 Station: -2 Exam by:: Dr Macon Large  Labs: Lab Results  Component Value Date   WBC 11.6* 10/29/2012   HGB 12.4 10/29/2012   HCT 34.8* 10/29/2012   MCV 90.6 10/29/2012   PLT 257 10/29/2012    Assessment / Plan: Induction of labor due to IUGR,  on pitocin  Labor: Continue Pitocin, currently at 14 mu/min Fetal Wellbeing:  Category I Pain Control:  Epidural I/D:  GBS negative Anticipated MOD:  NSVD  Tereso Newcomer, MD 10/29/2012, 10:12 PM

## 2012-10-29 NOTE — Anesthesia Preprocedure Evaluation (Signed)
Anesthesia Evaluation  Patient identified by MRN, date of birth, ID band Patient awake    Reviewed: Allergy & Precautions, H&P , Patient's Chart, lab work & pertinent test results  Airway Mallampati: II  TM Distance: >3 FB Neck ROM: full    Dental  (+) Teeth Intact   Pulmonary  breath sounds clear to auscultation        Cardiovascular Rhythm:regular Rate:Normal     Neuro/Psych    GI/Hepatic GERD-  ,  Endo/Other    Renal/GU      Musculoskeletal   Abdominal   Peds  Hematology   Anesthesia Other Findings       Reproductive/Obstetrics (+) Pregnancy                             Anesthesia Physical Anesthesia Plan  ASA: II  Anesthesia Plan: Epidural   Post-op Pain Management:    Induction:   Airway Management Planned:   Additional Equipment:   Intra-op Plan:   Post-operative Plan:   Informed Consent: I have reviewed the patients History and Physical, chart, labs and discussed the procedure including the risks, benefits and alternatives for the proposed anesthesia with the patient or authorized representative who has indicated his/her understanding and acceptance.   Dental Advisory Given  Plan Discussed with:   Anesthesia Plan Comments: (Labs checked- platelets confirmed with RN in room. Fetal heart tracing, per RN, reported to be stable enough for sitting procedure. Discussed epidural, and patient consents to the procedure:  included risk of possible headache,backache, failed block, allergic reaction, and nerve injury. This patient was asked if she had any questions or concerns before the procedure started.)        Anesthesia Quick Evaluation  

## 2012-10-29 NOTE — Progress Notes (Signed)
[name] is a 27 y.o. G3P2002 at [redacted]w[redacted]d by ultrasound admitted for induction of labor due to IUGR.  Subjective:  Patient presents for IOL. She is comfortable and denies complaint. Patients mother and baby's father are supportive at bedside.   Objective: BP 107/69  Pulse 86  Temp(Src) 98.4 F (36.9 C) (Oral)  Resp 18  Ht 5\' 7"  (1.702 m)  Wt 80.74 kg (178 lb)  BMI 27.87 kg/m2      FHT:  FHR: 150 bpm, variability: moderate,  accelerations:  Present,  decelerations:  Absent UC:   Complains of mild cramping. SVE:   Dilation: 3 Effacement (%): 60 Station: -2 Exam by:: Cleone Slim RN   Labs: Lab Results  Component Value Date   WBC 12.6* 09/02/2012   HGB 12.2 09/02/2012   HCT 35.7* 09/02/2012   MCV 93.0 09/02/2012   PLT 260 09/02/2012    Assessment / Plan: IOL. Will start PV Cytotec.  Labor: Patient will be given Cytotec to induce. Preeclampsia:  no signs or symptoms of toxicity Fetal Wellbeing:  Category I Pain Control:  Epidural I/D:  n/a Anticipated MOD:  NSVD  Patient has history of HSV infection. Her perineum was examined no outbreak or lesion was seen. She reports that she has been taking the valtrex as prescribed for 3 weeks.  Family at bedside does not know about HSV history.  Bing Plume 10/29/2012, 9:18 AM

## 2012-10-29 NOTE — Anesthesia Procedure Notes (Signed)
Epidural Patient location during procedure: OB  Preanesthetic Checklist Completed: patient identified, site marked, surgical consent, pre-op evaluation, timeout performed, IV checked, risks and benefits discussed and monitors and equipment checked  Epidural Patient position: sitting Prep: site prepped and draped and DuraPrep Patient monitoring: continuous pulse ox and blood pressure Approach: midline Injection technique: LOR air  Needle:  Needle type: Tuohy  Needle gauge: 17 G Needle length: 9 cm and 9 Needle insertion depth: 5 cm cm Catheter type: closed end flexible Catheter size: 19 Gauge Catheter at skin depth: 10 cm Test dose: negative  Assessment Events: blood not aspirated, injection not painful, no injection resistance, negative IV test and no paresthesia  Additional Notes Dosing of Epidural:  1st dose, through catheter ............................................. epi 1:200K + Xylocaine 40 mg  2nd dose, through catheter, after waiting 3 minutes.....epi 1:200K + Xylocaine 60 mg    ( 2% Xylo charted as a single dose in Epic Meds for ease of charting; actual dosing was fractionated as above, for saftey's sake)  As each dose occurred, patient was free of IV sx; and patient exhibited no evidence of SA injection.  Patient is more comfortable after epidural dosed. Please see RN's note for documentation of vital signs,and FHR which are stable.  Patient reminded not to try to ambulate with numb legs, and that an RN must be present when she attempts to get up.       

## 2012-10-30 ENCOUNTER — Encounter (HOSPITAL_COMMUNITY): Payer: Self-pay

## 2012-10-30 DIAGNOSIS — O365990 Maternal care for other known or suspected poor fetal growth, unspecified trimester, not applicable or unspecified: Secondary | ICD-10-CM

## 2012-10-30 DIAGNOSIS — O98519 Other viral diseases complicating pregnancy, unspecified trimester: Secondary | ICD-10-CM

## 2012-10-30 LAB — TYPE AND SCREEN
ABO/RH(D): O POS
Antibody Screen: NEGATIVE

## 2012-10-30 LAB — ABO/RH: ABO/RH(D): O POS

## 2012-10-30 MED ORDER — SENNOSIDES-DOCUSATE SODIUM 8.6-50 MG PO TABS
2.0000 | ORAL_TABLET | ORAL | Status: DC
  Administered 2012-10-31 (×2): 2 via ORAL
  Filled 2012-10-30 (×3): qty 2

## 2012-10-30 MED ORDER — KETOROLAC TROMETHAMINE 30 MG/ML IJ SOLN
30.0000 mg | Freq: Once | INTRAMUSCULAR | Status: AC
  Administered 2012-10-30: 30 mg via INTRAVENOUS
  Filled 2012-10-30: qty 1

## 2012-10-30 MED ORDER — OXYTOCIN 40 UNITS IN LACTATED RINGERS INFUSION - SIMPLE MED
150.0000 mL/h | INTRAVENOUS | Status: DC
  Administered 2012-10-30: 150 mL/h via INTRAVENOUS
  Filled 2012-10-30: qty 1000

## 2012-10-30 MED ORDER — BENZOCAINE-MENTHOL 20-0.5 % EX AERO
1.0000 "application " | INHALATION_SPRAY | CUTANEOUS | Status: DC | PRN
  Filled 2012-10-30: qty 56

## 2012-10-30 MED ORDER — ONDANSETRON HCL 4 MG/2ML IJ SOLN: 4.0000 mg | INTRAMUSCULAR | Status: DC | PRN

## 2012-10-30 MED ORDER — METHYLERGONOVINE MALEATE 0.2 MG/ML IJ SOLN: 0.2000 mg | INTRAMUSCULAR | Status: DC | PRN

## 2012-10-30 MED ORDER — MEASLES, MUMPS & RUBELLA VAC ~~LOC~~ INJ
0.5000 mL | INJECTION | Freq: Once | SUBCUTANEOUS | Status: DC
  Filled 2012-10-30: qty 0.5

## 2012-10-30 MED ORDER — HYDROMORPHONE HCL PF 1 MG/ML IJ SOLN
1.0000 mg | Freq: Once | INTRAMUSCULAR | Status: AC
  Administered 2012-10-30: 1 mg via INTRAVENOUS

## 2012-10-30 MED ORDER — HYDROMORPHONE HCL PF 1 MG/ML IJ SOLN
INTRAMUSCULAR | Status: AC
  Filled 2012-10-30: qty 1

## 2012-10-30 MED ORDER — DIPHENHYDRAMINE HCL 25 MG PO CAPS: 25.0000 mg | ORAL_CAPSULE | Freq: Four times a day (QID) | ORAL | Status: DC | PRN

## 2012-10-30 MED ORDER — WITCH HAZEL-GLYCERIN EX PADS: 1.0000 "application " | MEDICATED_PAD | CUTANEOUS | Status: DC | PRN

## 2012-10-30 MED ORDER — DIBUCAINE 1 % RE OINT: 1.0000 "application " | TOPICAL_OINTMENT | RECTAL | Status: DC | PRN

## 2012-10-30 MED ORDER — METHYLERGONOVINE MALEATE 0.2 MG PO TABS
0.2000 mg | ORAL_TABLET | Freq: Once | ORAL | Status: AC
  Administered 2012-10-30: 0.2 mg via ORAL
  Filled 2012-10-30: qty 1

## 2012-10-30 MED ORDER — SIMETHICONE 80 MG PO CHEW: 80.0000 mg | CHEWABLE_TABLET | ORAL | Status: DC | PRN

## 2012-10-30 MED ORDER — METHYLERGONOVINE MALEATE 0.2 MG PO TABS: 0.2000 mg | ORAL_TABLET | ORAL | Status: DC | PRN

## 2012-10-30 MED ORDER — LANOLIN HYDROUS EX OINT: TOPICAL_OINTMENT | CUTANEOUS | Status: DC | PRN

## 2012-10-30 MED ORDER — TETANUS-DIPHTH-ACELL PERTUSSIS 5-2.5-18.5 LF-MCG/0.5 IM SUSP
0.5000 mL | Freq: Once | INTRAMUSCULAR | Status: AC
  Administered 2012-10-31: 0.5 mL via INTRAMUSCULAR
  Filled 2012-10-30: qty 0.5

## 2012-10-30 MED ORDER — PRENATAL MULTIVITAMIN CH
1.0000 | ORAL_TABLET | Freq: Every day | ORAL | Status: DC
  Administered 2012-10-31: 1 via ORAL
  Filled 2012-10-30: qty 1

## 2012-10-30 MED ORDER — OXYCODONE-ACETAMINOPHEN 5-325 MG PO TABS
1.0000 | ORAL_TABLET | ORAL | Status: DC | PRN
  Administered 2012-10-30 – 2012-11-01 (×10): 1 via ORAL
  Filled 2012-10-30 (×8): qty 1
  Filled 2012-10-30: qty 2
  Filled 2012-10-30: qty 1

## 2012-10-30 MED ORDER — ZOLPIDEM TARTRATE 5 MG PO TABS: 5.0000 mg | ORAL_TABLET | Freq: Every evening | ORAL | Status: DC | PRN

## 2012-10-30 MED ORDER — ONDANSETRON HCL 4 MG PO TABS: 4.0000 mg | ORAL_TABLET | ORAL | Status: DC | PRN

## 2012-10-30 NOTE — Progress Notes (Signed)
Notified Keli Beck of pt's increased bleeding and multiple clots. Methergine given. LR Bolus started on pt due to increased lightheadedness and feeling faint.

## 2012-10-30 NOTE — Progress Notes (Signed)
[name] is a 27 y.o. G3P2002 at [redacted]w[redacted]d here for IOL for IUGR   Subjective: Comfortable with epidural. Pt sleeping  Objective: BP 84/44  Pulse 66  Temp(Src) 97.9 F (36.6 C) (Oral)  Resp 18  Ht 5\' 7"  (1.702 m)  Wt 80.74 kg (178 lb)  BMI 27.87 kg/m2  SpO2 98%  FHT:  FHR: 120 bpm, variability: moderate,  accelerations:  Present,  decelerations:  Absent UC:   q71min SVE:   Dilation: 3 Effacement (%): 50 Station: -2;-3 Exam by:: Larose Kells RN  Labs: Lab Results  Component Value Date   WBC 11.6* 10/29/2012   HGB 12.4 10/29/2012   HCT 34.8* 10/29/2012   MCV 90.6 10/29/2012   PLT 257 10/29/2012    Assessment / Plan: Induction of labor due to IUGR,  on pitocin  Labor: Continue Pitocin, currently at 14 mu/min Fetal Wellbeing:  Category I Pain Control:  Epidural I/D:  GBS negative Anticipated MOD:  NSVD  Minta Balsam, MD 10/30/2012, 4:36 AM

## 2012-10-30 NOTE — Progress Notes (Signed)
UR chart review completed.  

## 2012-10-30 NOTE — Progress Notes (Signed)
ADALAYA IRION is a 27 y.o. G3P2002 at [redacted]w[redacted]d  admitted for IOL for IUGR.   Subjective:  Started having some bloody show about 2 hours ago.  +FM. +Ctx. No LOF  Objective: BP 107/70  Pulse 60  Temp(Src) 98 F (36.7 C) (Oral)  Resp 18  Ht 5\' 7"  (1.702 m)  Wt 80.74 kg (178 lb)  BMI 27.87 kg/m2  SpO2 98%      FHT:  FHR: 130 bpm, variability: moderate,  accelerations:  Present,  decelerations:  Absent UC:   regular, every 2-3 minutes SVE:   Dilation: 5 Effacement (%): 90 Station: -2 Exam by:: dr. Reola Calkins  Labs: Lab Results  Component Value Date   WBC 11.6* 10/29/2012   HGB 12.4 10/29/2012   HCT 34.8* 10/29/2012   MCV 90.6 10/29/2012   PLT 257 10/29/2012    Assessment / Plan: finally starting to change cervix. bulging bag of water.   Labor: now progressing on pitocin 52mu/min Fetal Wellbeing:  Category I Pain Control:  Epidural I/D:  n/a Anticipated MOD:  NSVD  Edras Wilford L 10/30/2012, 10:12 AM

## 2012-10-30 NOTE — Progress Notes (Signed)
Patient ID: [name], female   DOB: 08-11-1985, 27 y.o.   MRN: [id number]  Called to pt's room for continued bleeding and passing clots since her vaginal delivery. Pt also just got up to go to the restroom and got lightheaded and BP was 89/50.    Pt's uterus felt large and boggy at time of my exam and was manually explored with evacuate of large amount of clot (>500c) from the fundus through a clamped down lower uterine segment.  After evacuation, her uterus contracted down and was firm to bimanual.  She was given a bolus and then placed on pitocin for 8 hours and the methergine series.    She will have a cbc in the AM.   Pt checked on 2 hours later and doing much better. Bleeding is well controlled and pain has improved.

## 2012-10-31 ENCOUNTER — Encounter (HOSPITAL_COMMUNITY)
Admission: RE | Disposition: A | Discharge status: Home / Self Care | Payer: Self-pay | Source: Ambulatory Visit | Attending: Obstetrics & Gynecology

## 2012-10-31 ENCOUNTER — Encounter (HOSPITAL_COMMUNITY): Payer: Self-pay

## 2012-10-31 ENCOUNTER — Inpatient Hospital Stay (HOSPITAL_COMMUNITY): Payer: Medicaid Other | Admitting: Anesthesiology

## 2012-10-31 ENCOUNTER — Encounter (HOSPITAL_COMMUNITY): Payer: Medicaid Other | Admitting: Anesthesiology

## 2012-10-31 LAB — CBC
HCT: 24.5 % — ABNORMAL LOW (ref 36.0–46.0)
MCH: 32.6 pg (ref 26.0–34.0)
MCV: 89.7 fL (ref 78.0–100.0)
RBC: 2.73 MIL/uL — ABNORMAL LOW (ref 3.87–5.11)
WBC: 13.6 10*3/uL — ABNORMAL HIGH (ref 4.0–10.5)

## 2012-10-31 SURGERY — LIGATION, FALLOPIAN TUBE, POSTPARTUM
Anesthesia: Epidural | Laterality: Bilateral

## 2012-10-31 MED ORDER — LACTATED RINGERS IV SOLN: INTRAVENOUS | Status: DC

## 2012-10-31 MED ORDER — METOCLOPRAMIDE HCL 10 MG PO TABS: 10.0000 mg | ORAL_TABLET | Freq: Once | ORAL | Status: DC

## 2012-10-31 MED ORDER — FAMOTIDINE 20 MG PO TABS: 40.0000 mg | ORAL_TABLET | Freq: Once | ORAL | Status: DC

## 2012-10-31 SURGICAL SUPPLY — 18 items
CHLORAPREP W/TINT 26ML (MISCELLANEOUS) ×1 IMPLANT
CLOTH BEACON ORANGE TIMEOUT ST (SAFETY) ×1 IMPLANT
GLOVE BIOGEL PI IND STRL 7.0 (GLOVE) ×1 IMPLANT
GLOVE BIOGEL PI INDICATOR 7.0 (GLOVE)
GLOVE ECLIPSE 7.0 STRL STRAW (GLOVE) ×2 IMPLANT
GOWN PREVENTION PLUS LG XLONG (DISPOSABLE) ×1 IMPLANT
GOWN PREVENTION PLUS XLARGE (GOWN DISPOSABLE) ×1 IMPLANT
NEEDLE HYPO 22GX1.5 SAFETY (NEEDLE) ×1 IMPLANT
NS IRRIG 1000ML POUR BTL (IV SOLUTION) ×1 IMPLANT
PACK ABDOMINAL MINOR (CUSTOM PROCEDURE TRAY) ×1 IMPLANT
SPONGE LAP 4X18 X RAY DECT (DISPOSABLE) IMPLANT
SUT VIC AB 0 CT1 27 (SUTURE)
SUT VIC AB 0 CT1 27XBRD ANBCTR (SUTURE) ×1 IMPLANT
SUT VICRYL 4-0 PS2 18IN ABS (SUTURE) ×1 IMPLANT
SYR CONTROL 10ML LL (SYRINGE) ×1 IMPLANT
TOWEL OR 17X24 6PK STRL BLUE (TOWEL DISPOSABLE) ×2 IMPLANT
TRAY FOLEY CATH 14FR (SET/KITS/TRAYS/PACK) ×1 IMPLANT
WATER STERILE IRR 1000ML POUR (IV SOLUTION) ×1 IMPLANT

## 2012-10-31 NOTE — Progress Notes (Signed)
I spoke with and examined patient and agree with resident's note and plan of care.  Eating, drinking, voiding, ambulating well.  +flatus.  Lochia and pain wnl.  Denies dizziness, lightheadedness, or sob. No complaints.  Cheral Marker, CNM, [facility] 10/31/2012 12:54 PM

## 2012-10-31 NOTE — Anesthesia Preprocedure Evaluation (Signed)
Anesthesia Evaluation  Patient identified by MRN, date of birth, ID band Patient awake    Reviewed: Allergy & Precautions, H&P , Patient's Chart, lab work & pertinent test results  Airway Mallampati: II TM Distance: >3 FB Neck ROM: full    Dental  (+) Teeth Intact   Pulmonary asthma ,  breath sounds clear to auscultation        Cardiovascular Rhythm:regular Rate:Normal     Neuro/Psych  Neuromuscular disease    GI/Hepatic GERD-  ,  Endo/Other    Renal/GU      Musculoskeletal  (+) Fibromyalgia -  Abdominal   Peds  Hematology   Anesthesia Other Findings       Reproductive/Obstetrics (+) Pregnancy                           Anesthesia Physical  Anesthesia Plan  ASA: II  Anesthesia Plan: Epidural   Post-op Pain Management:    Induction:   Airway Management Planned:   Additional Equipment:   Intra-op Plan:   Post-operative Plan:   Informed Consent: I have reviewed the patients History and Physical, chart, labs and discussed the procedure including the risks, benefits and alternatives for the proposed anesthesia with the patient or authorized representative who has indicated his/her understanding and acceptance.   Dental Advisory Given  Plan Discussed with:   Anesthesia Plan Comments: (Labs checked- platelets confirmed with RN in room. Fetal heart tracing, per RN, reported to be stable enough for sitting procedure. Discussed epidural, and patient consents to the procedure:  included risk of possible headache,backache, failed block, allergic reaction, and nerve injury. This patient was asked if she had any questions or concerns before the procedure started. )        Anesthesia Quick Evaluation

## 2012-10-31 NOTE — Anesthesia Postprocedure Evaluation (Signed)
Anesthesia Post Note  Patient: [name]  Procedure(s) Performed: * No procedures listed *  Anesthesia type: Epidural  Patient location: Mother/Baby  Post pain: Pain level controlled  Post assessment: Post-op Vital signs reviewed  Last Vitals:  Filed Vitals:   10/31/12 0435  BP: 106/71  Pulse: 74  Temp: 36.7 C  Resp: 18    Post vital signs: Reviewed  Level of consciousness:alert  Complications: No apparent anesthesia complications

## 2012-10-31 NOTE — Progress Notes (Signed)
Post Partum Day 1 Subjective: no complaints, up ad lib, voiding and tolerating PO, lying in bed, mother at bedside. Mother appears to be more concerned abt events of yesterday than patient.  Objective: Blood pressure 106/71, pulse 74, temperature 98.1 F (36.7 C), temperature source Oral, resp. rate 18, height 5\' 7"  (1.702 m), weight 80.74 kg (178 lb), SpO2 98.00%, unknown if currently breastfeeding.  Physical Exam:  General: alert, cooperative and no distress Lochia: appropriate Uterine Fundus: firm at the umbilicus Incision: n/a DVT Evaluation: No evidence of DVT seen on physical exam.   Recent Labs  10/29/12 0805 10/31/12 0600  HGB 12.4 8.8*  HCT 34.8* 24.5*    Assessment/Plan: Plan for discharge tomorrow Ms Vallely is a 27y.o [id number] who presented at 39w0 for IOL 2/2 IUGR NSVD with EBL of 300, 1st degree lac was repaired in the usual fashion.Dr Reola Calkins notified of hypotension 89/50 last night around 7pm. Uterus felt boggy. Manual exploration with evac of lg clot from the fundus. Uterus contracted down and was then firm to bimanual palpation. Placed on pit for 8hrs and methergine series following. Her CBC this am was 8.8. Pt appeared hemodynamically stable, with minimal vaginal bleeding today. Plan was org for BTL this am however pt has decided to wait given the aforementioned events and pursue this postpartum. Continues to desire to bottle feed infant.    LOS: 2 days   Anselm Lis 10/31/2012, 8:01 AM

## 2012-10-31 NOTE — Progress Notes (Signed)
Patient has decided she does not want to have BTL today- wants to do as outpatient instead. Philipp Deputy CNM notified and diet order changed to regular

## 2012-10-31 NOTE — Progress Notes (Signed)
Clinical Social Work Department PSYCHOSOCIAL ASSESSMENT - MATERNAL/CHILD 10/31/2012  Patient:  [name], [name]  Account Number:  [id number]  Admit Date:  10/29/2012  Marjo Bicker Name:   [name]    Clinical Social Worker:  Sheniya Garciaperez, LCSW   Date/Time:  10/31/2012 10:15 AM  Date Referred:  10/30/2012   Referral source  CSW     Referred reason  Psychosocial assessment   Other referral source:    I:  FAMILY / HOME ENVIRONMENT Child's legal guardian:  PARENT  Guardian - Name Guardian - Age Guardian - Address  [name],[name] 27 [address]  [place], [place] [address]  [name]     Other household support members/support persons Other support:   maternal grand mother    II  PSYCHOSOCIAL DATA Information Source:  Patient Interview  Event organiser Employment:   Surveyor, quantity resources:  [employer] If [employer]:   Other  [employer]  Chemical engineer / Grade:   Maternity Care Coordinator / Child Services Coordination / Early Interventions:  Cultural issues impacting care:    III  STRENGTHS Strengths  Adequate Resources  Home prepared for Child (including basic supplies)  Supportive family/friends   Strength comment:    IV  RISK FACTORS AND CURRENT PROBLEMS Current Problem:       V  SOCIAL WORK ASSESSMENT Acknowledged order for Social Work consult.  Patient has hx of narcotic abuse and anxiety.  Parents are not married.  They have two other dependents ages 62 and 49. Informed that they have been in a relationship for 13 years and are supportive of each other.  Mother was pleasant and receptive to social work intervention.   She seem comfortable talking about her history.   Informed that she has a hx of panic attacks  about 6-7 years ago, and was prescribed medication which she took for only 6 weeks because it made her drowsy.   Informed that she has not experienced a panic attack since she stop taking the medication about 6-7 years ago.  She  denies any hx of psychiatric hospitalization or treatment.  She denies currently symptoms of depression or anxiety.  She denies any use of alcohol or illicit drug use during pregnancy.  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  Informed that she is very selective about what pain medication she takes because of her addiction hx.  UDS was negative on the baby.  Mother reports extensive family support.   Discussed signs/symptoms of PP depression with mother.  Provided her with literature and treatment resources if needed.  She reports adequate support at home.   No acute social concerns reported or noted at this time.  Mother informed of social work Surveyor, mining.   VI SOCIAL WORK PLAN  Type of pt/family education:   If child protective services report - county:   If child protective services report - date:   Information/referral to community resources comment:   Other social work plan:   CSW will continue to follow PRN.    Zaiden Ludlum J, LCSW

## 2012-11-01 MED ORDER — BISACODYL 10 MG RE SUPP
10.0000 mg | Freq: Once | RECTAL | Status: AC
  Administered 2012-11-01: 10 mg via RECTAL
  Filled 2012-11-01: qty 1

## 2012-11-01 MED ORDER — TRAMADOL HCL 50 MG PO TABS: 50.0000 mg | ORAL_TABLET | Freq: Four times a day (QID) | ORAL | Status: DC | PRN

## 2012-11-01 NOTE — Discharge Summary (Signed)
Obstetric Discharge Summary Reason for Admission: induction of labor d/t IUGR Prenatal Procedures: NST and ultrasound Intrapartum Procedures: spontaneous vaginal delivery Postpartum Procedures: manual evacuation of >500cc clots  Complications-Operative and Postpartum: 1st degree perineal laceration repaired Eating, drinking, voiding, ambulating well.  +flatus.  Lochia and pain wnl.  Denies dizziness, lightheadedness, or sob. 'Gas pains'. Usually has bm daily. Had suppository last night and had small amount results.   Hemoglobin  Date Value Range Status  10/31/2012 8.8* 12.0 - 15.0 g/dL Final     REPEATED TO VERIFY     DELTA CHECK NOTED     HCT  Date Value Range Status  10/31/2012 24.5* 36.0 - 46.0 % Final   Hospital Course: Admitted on 10/16 @ 39wks for IOL d/t IUGR, received cytotec then pitocin, and had uncomplicated SVD on 10/17 w/ EBL. Few hrs after birth, provider was called d/t increased lochia/clots, and >548ml clots were evacuated from uterine fundus w/ quick resolution of bleeding and firming of uterus. No complications since. Hgb dropped from 12.4 to 8.8, but pt is asymptomatic.   Physical Exam:  General: alert, cooperative and no distress Lochia: appropriate Uterine Fundus: firm Incision: n/a DVT Evaluation: No evidence of DVT seen on physical exam. Negative Homan's sign. No cords or calf tenderness. No significant calf/ankle edema.  Discharge Diagnoses: Term Pregnancy-delivered w/ PPH  Discharge Information: Date: 11/01/2012 Activity: pelvic rest Diet: routine Medications: PNV and Colace, h/o narcotic abuse and pt allergic to nsaids, discussed w/ dr. Shawnie Pons, Ultram rx 0RF Condition: stable Instructions: refer to practice specific booklet Discharge to: home Follow-up Information   Schedule an appointment as soon as possible for a visit with [facility]. (4-6 weeks for your postpartum visit)    Specialty:  Obstetrics and Gynecology   Contact  information:   [address] [place] [address] [phone]      Newborn Data: Live born female  Birth Weight: 5 lb 9.2 oz (2530 g) APGAR: 9, 9  Home with mother. Bottlefeeding Info given on constipation prevention/relief measures Contraception: originally planned for IP BTL, but after PPH and evacuation of uterus, she desires interim BTL- understands to remain abstinent  Marge Duncans 11/01/2012, 6:13 AM

## 2012-11-05 ENCOUNTER — Ambulatory Visit (INDEPENDENT_AMBULATORY_CARE_PROVIDER_SITE_OTHER): Payer: Medicaid Other | Admitting: Advanced Practice Midwife

## 2012-11-05 ENCOUNTER — Encounter: Payer: Self-pay | Admitting: Advanced Practice Midwife

## 2012-11-05 DIAGNOSIS — K5900 Constipation, unspecified: Secondary | ICD-10-CM

## 2012-11-05 DIAGNOSIS — IMO0002 Reserved for concepts with insufficient information to code with codable children: Secondary | ICD-10-CM

## 2012-11-05 DIAGNOSIS — M545 Low back pain: Secondary | ICD-10-CM

## 2012-11-05 MED ORDER — HYDROCHLOROTHIAZIDE 25 MG PO TABS: 25.0000 mg | ORAL_TABLET | Freq: Every day | ORAL | Status: DC

## 2012-11-05 MED ORDER — POLYETHYLENE GLYCOL 3350 17 G PO PACK: 17.0000 g | PACK | Freq: Every day | ORAL | Status: DC

## 2012-11-05 MED ORDER — TRAMADOL HCL 50 MG PO TABS: 50.0000 mg | ORAL_TABLET | Freq: Four times a day (QID) | ORAL | Status: DC | PRN

## 2012-11-05 MED ORDER — CYCLOBENZAPRINE HCL 10 MG PO TABS: 10.0000 mg | ORAL_TABLET | Freq: Three times a day (TID) | ORAL | Status: DC | PRN

## 2012-11-05 NOTE — Progress Notes (Signed)
[name] 27 y.o. Is 1 week following a SVD with mild pph.   She has not had a BM in 8 days.  Stool softners and suppository have not produced results  Right sided back pain in lumbar region  Dependent edema  PHYSICAL EXAM: 2 + pitting edema 1/2 way to knees.  DTR's 2+  Abdomen soft, hypoactive BS right side  Back pain in area of muscles   ASSESSMENT/PLAN;  Normal postpartum edema:  HCTZ 25 mg PO X 7 days Constipation:  miralax and fleets prn results Musculoskeletal pain:  Flexeril 10mg  TID prn/ice Rx for Tramadol 50mg  Q 6hr prn # 30 no refills given d/t history of fibromyalgia.  To resume care for that with PCP

## 2012-11-16 ENCOUNTER — Telehealth: Payer: Self-pay | Admitting: *Deleted

## 2012-11-16 ENCOUNTER — Other Ambulatory Visit: Payer: Self-pay | Admitting: Advanced Practice Midwife

## 2012-11-16 NOTE — Telephone Encounter (Signed)
Left message x 1. JSY 

## 2012-11-18 ENCOUNTER — Telehealth: Payer: Self-pay

## 2012-11-18 ENCOUNTER — Encounter: Payer: Self-pay | Admitting: Obstetrics and Gynecology

## 2012-11-18 ENCOUNTER — Ambulatory Visit (INDEPENDENT_AMBULATORY_CARE_PROVIDER_SITE_OTHER): Payer: Medicaid Other | Admitting: Obstetrics and Gynecology

## 2012-11-18 VITALS — BP 100/62 | Ht 68.0 in | Wt 160.0 lb

## 2012-11-18 DIAGNOSIS — L02211 Cutaneous abscess of abdominal wall: Secondary | ICD-10-CM

## 2012-11-18 DIAGNOSIS — L02219 Cutaneous abscess of trunk, unspecified: Secondary | ICD-10-CM

## 2012-11-18 MED ORDER — HYDROCODONE-ACETAMINOPHEN 5-325 MG PO TABS: 1.0000 | ORAL_TABLET | Freq: Four times a day (QID) | ORAL | Status: DC | PRN

## 2012-11-18 MED ORDER — DULOXETINE HCL 60 MG PO CPEP: 60.0000 mg | ORAL_CAPSULE | Freq: Every day | ORAL | Status: DC

## 2012-11-18 NOTE — Patient Instructions (Signed)
followup Dr Gerda Diss in dec

## 2012-11-18 NOTE — Telephone Encounter (Signed)
Left message x 2. JSY 

## 2012-11-18 NOTE — Telephone Encounter (Signed)
Pt saw Dr. Emelda Fear on 11/18/12. Encounter closed. JSY

## 2012-11-18 NOTE — Progress Notes (Signed)
Patient ID: [name], female   DOB: 19-Oct-1985, 27 y.o.   MRN: [id number] Pt here today to dscuss getting back on pain meds and lyrica. Pt taking 8-10 tylenol daily for the generalized pain. Pt c hx "fatty liver" Also, on clindamycin for oral infected teeth, still has a small fluctuant 1.5 cm area to left inferior to umbilicus, recurred several x during pregnancy , now worse. Meds: lyrica          Cymbalta           Pain medicine.(hydrocodone 5/325, taken up to qid.) Tramadol 50's caused nausea, but pt was on hi doses.  Currently on Neurontin,from JAGr[employer]in, begun during pregnancy , now 300 tid. These pain meds taken for fibromyalgia.  Plan: will continue neurontin 300 tid           Restart Cymbalta 60 daily ref x 3           Will not restart Lyica, as pt thinks wt gain a n issue with it.           Advise pt to hold off using hydrocodone 5 qid, to see pain specialist or primary care for this drug  Will see luking in Dec has appt.   I&D folliculits: local anesth, y-shaped incision of 1.5 cm fluctuance, with purulent drainage, depth <1 cm. Not packed.

## 2012-11-20 NOTE — Telephone Encounter (Signed)
Left message x 2. JSY 

## 2012-11-25 ENCOUNTER — Telehealth: Payer: Self-pay | Admitting: *Deleted

## 2012-11-25 ENCOUNTER — Emergency Department (HOSPITAL_COMMUNITY)
Admission: EM | Admit: 2012-11-25 | Discharge: 2012-11-25 | Disposition: A | Discharge status: Home / Self Care | Payer: Medicaid Other | Attending: Emergency Medicine | Admitting: Emergency Medicine

## 2012-11-25 ENCOUNTER — Encounter (HOSPITAL_COMMUNITY): Payer: Self-pay | Admitting: Emergency Medicine

## 2012-11-25 DIAGNOSIS — Z880 Allergy status to penicillin: Secondary | ICD-10-CM | POA: Insufficient documentation

## 2012-11-25 DIAGNOSIS — J45909 Unspecified asthma, uncomplicated: Secondary | ICD-10-CM | POA: Insufficient documentation

## 2012-11-25 DIAGNOSIS — F411 Generalized anxiety disorder: Secondary | ICD-10-CM | POA: Insufficient documentation

## 2012-11-25 DIAGNOSIS — Z862 Personal history of diseases of the blood and blood-forming organs and certain disorders involving the immune mechanism: Secondary | ICD-10-CM | POA: Insufficient documentation

## 2012-11-25 DIAGNOSIS — G8929 Other chronic pain: Secondary | ICD-10-CM | POA: Insufficient documentation

## 2012-11-25 DIAGNOSIS — Z79899 Other long term (current) drug therapy: Secondary | ICD-10-CM | POA: Insufficient documentation

## 2012-11-25 DIAGNOSIS — Z8719 Personal history of other diseases of the digestive system: Secondary | ICD-10-CM | POA: Insufficient documentation

## 2012-11-25 DIAGNOSIS — M545 Low back pain, unspecified: Secondary | ICD-10-CM | POA: Insufficient documentation

## 2012-11-25 DIAGNOSIS — M549 Dorsalgia, unspecified: Secondary | ICD-10-CM

## 2012-11-25 DIAGNOSIS — F17200 Nicotine dependence, unspecified, uncomplicated: Secondary | ICD-10-CM | POA: Insufficient documentation

## 2012-11-25 MED ORDER — OXYCODONE-ACETAMINOPHEN 5-325 MG PO TABS: 2.0000 | ORAL_TABLET | Freq: Three times a day (TID) | ORAL | Status: DC | PRN

## 2012-11-25 MED ORDER — GABAPENTIN 100 MG PO CAPS: 100.0000 mg | ORAL_CAPSULE | Freq: Three times a day (TID) | ORAL | Status: DC

## 2012-11-25 NOTE — ED Notes (Signed)
Pt c/o "fibromyalgia" and c/o lower back pain. Out of meds. NAD. No s/s of pain at this time. Sx's x 1 month. Denies urinary sx's.

## 2012-11-25 NOTE — Telephone Encounter (Signed)
Left message x 3. Encounter closed. JSY 

## 2012-11-25 NOTE — Telephone Encounter (Signed)
Spoke with pt. Pt wanted [place] to be aware that Hydrocodone 5/325 now causes vomiting. I let pt know I would make a note of it. JSY

## 2012-11-25 NOTE — ED Provider Notes (Signed)
CSN: [id number]     Arrival date & time 11/25/12  0917 History  This chart was scribed for Benny Lennert, MD by Ardelia Mems, ED Scribe. This patient was seen in room APA07/APA07 and the patient's care was started at 9:34 AM.   Chief Complaint  Patient presents with  . Back Pain    Patient is a 27 y.o. female presenting with back pain. The history is provided by the patient. No language interpreter was used.  Back Pain Location:  Lumbar spine Radiates to:  Does not radiate Pain severity:  Moderate Onset quality:  Gradual Duration:  1 week Timing:  Constant Progression:  Waxing and waning Chronicity:  Chronic Context comment:  Chronic pain; ran out of Neurontin and Oxycodone Relieved by:  None tried Worsened by:  Nothing tried Ineffective treatments:  None tried Associated symptoms: no abdominal pain, no bladder incontinence, no bowel incontinence, no chest pain, no headaches, no leg pain, no numbness, no paresthesias, no perianal numbness and no weakness     HPI Comments: [name] is a 27 y.o. Female with a history of fibromyalgia and lumbar disc herniation who presents to the Emergency Department complaining of increased lower back pain over the past week. She also states that she has been having increased fibromyalgia pain over the past week. She states that she normally takes Neurontin 300 mg 3x a day and Oxycodone 5 mg 3 times a day for this pain. However, she has not been able to get her prescription refilled due to insurance complications. She states that she ran out of her medicaiton about 1 week ago. She denies radiation of pain to her legs or any other symptoms.   Past Medical History  Diagnosis Date  . Other dysphagia   . Esophageal reflux   . Irritable bowel syndrome   . Hyperemesis gravidarum with metabolic disturbance, unspecified as to episode of care   . Anemia, unspecified   . Unspecified constipation   . Headache(784.0)   . Unspecified asthma(493.90)    . Gastroparesis   . Anxiety state, unspecified   . Other and unspecified noninfectious gastroenteritis and colitis(558.9)   . Nausea alone   . Abdominal pain, unspecified site   . Fibromyalgia   . Irritable bowel syndrome   . Fatty liver   . Pregnant   . Disc herniation     causes sciatica unsure which disc  . History of narcotic addiction 09/30/2012    2010:  Per pt, was addicted to narcotics; mom helped intervene and stopped taking opiods    Past Surgical History  Procedure Laterality Date  . Cholecystectomy    . Appendectomy     Family History  Problem Relation Age of Onset  . Ulcerative colitis Paternal Grandmother   . Cancer Paternal Grandmother   . Colon polyps Paternal Grandfather   . Cancer Paternal Grandfather     thyroid  . Diabetes Father   . Heart disease Father   . Kidney disease Father   . Hypertension Father    History  Substance Use Topics  . Smoking status: Current Every Day Smoker -- 0.50 packs/day for 10 years    Types: Cigarettes  . Smokeless tobacco: Never Used  . Alcohol Use: No   OB History   Grav Para Term Preterm Abortions TAB SAB Ect Mult Living   3 3 3       3      Review of Systems  Constitutional: Negative for appetite change and fatigue.  HENT: Negative for congestion, ear discharge and sinus pressure.   Eyes: Negative for discharge.  Respiratory: Negative for cough.   Cardiovascular: Negative for chest pain.  Gastrointestinal: Negative for abdominal pain, diarrhea and bowel incontinence.  Genitourinary: Negative for bladder incontinence, frequency and hematuria.  Musculoskeletal: Positive for back pain.  Skin: Negative for rash.  Neurological: Negative for seizures, weakness, numbness, headaches and paresthesias.  Psychiatric/Behavioral: Negative for hallucinations.  All other systems reviewed and are negative.    Allergies  Azithromycin; Nsaids; Penicillins; and Prednisone  Home Medications   Current Outpatient Rx  Name   Route  Sig  Dispense  Refill  . DULoxetine (CYMBALTA) 60 MG capsule   Oral   Take 1 capsule (60 mg total) by mouth daily.   30 capsule   3   . gabapentin (NEURONTIN) 300 MG capsule   Oral   Take 1 capsule (300 mg total) by mouth 3 (three) times daily.   90 capsule   3   . hydrochlorothiazide (HYDRODIURIL) 25 MG tablet   Oral   Take 1 tablet (25 mg total) by mouth daily.   7 tablet   0   . HYDROcodone-acetaminophen (NORCO/VICODIN) 5-325 MG per tablet   Oral   Take 1 tablet by mouth every 6 (six) hours as needed.   60 tablet   0    Triage Vitals: BP 103/68  Pulse 72  Temp(Src) 98.3 F (36.8 C) (Oral)  Resp 18  SpO2 97%  Physical Exam  Constitutional: She is oriented to person, place, and time. She appears well-developed.  HENT:  Head: Normocephalic.  Eyes: Conjunctivae and EOM are normal. No scleral icterus.  Neck: Neck supple. No thyromegaly present.  Cardiovascular: Normal rate and regular rhythm.  Exam reveals no gallop and no friction rub.   No murmur heard. Pulmonary/Chest: No stridor. She has no wheezes. She has no rales. She exhibits no tenderness.  Abdominal: She exhibits no distension. There is no tenderness. There is no rebound.  Musculoskeletal: Normal range of motion. She exhibits tenderness. She exhibits no edema.  Moderate lumbar spine tenderness.  Lymphadenopathy:    She has no cervical adenopathy.  Neurological: She is oriented to person, place, and time. She exhibits normal muscle tone. Coordination normal.  Skin: No rash noted. No erythema.  Psychiatric: She has a normal mood and affect. Her behavior is normal.    ED Course  Procedures (including critical care time)  DIAGNOSTIC STUDIES: Oxygen Saturation is 97% on RA, normal by my interpretation.    COORDINATION OF CARE: 9:38 AM- Discussed plan for pt to receive about 1 month's worth of her Oxycodone and Neurontin prescriptions. Pt advised of plan for treatment and pt agrees.  Labs  Review Labs Reviewed - No data to display Imaging Review No results found.  EKG Interpretation   None       MDM  No diagnosis found.    The chart was scribed for me under my direct supervision.  I personally performed the history, physical, and medical decision making and all procedures in the evaluation of this patient.Benny Lennert, MD 11/25/12 [phone]

## 2012-12-03 ENCOUNTER — Ambulatory Visit (INDEPENDENT_AMBULATORY_CARE_PROVIDER_SITE_OTHER): Payer: Medicaid Other | Admitting: Obstetrics and Gynecology

## 2012-12-03 ENCOUNTER — Ambulatory Visit: Payer: Medicaid Other | Admitting: Nurse Practitioner

## 2012-12-03 ENCOUNTER — Encounter: Payer: Self-pay | Admitting: Obstetrics and Gynecology

## 2012-12-03 DIAGNOSIS — Z3009 Encounter for other general counseling and advice on contraception: Secondary | ICD-10-CM | POA: Insufficient documentation

## 2012-12-03 NOTE — Progress Notes (Signed)
Patient ID: [name], female   DOB: 25-May-1985, 27 y.o.   MRN: [id number]  Assessment:  1. Normal postpartum exam.  2. Pap smear not done at today's visit. Normal pap 3/14 [name]   Plan:  1. BTL to be scheduled, medicaid signed already.  Subjective:  [name] is a 27 y.o. female who presents for a postpartum visit. And preop for pp sterilization b y bilateral salpingectomy I have  reviewed the prenatal and intrapartum course.    Patient is not sexually active.   The following portions of the patient's history were reviewed and updated as appropriate: allergies, current medications, past family history, past medical history, past surgical history and problem list. [name] PP depression score 2. (good) Hx fibromyalgia, stable will be referred by Dr Gerda Diss to pain clinic, or Dr Gerilyn Pilgrim  Review of Systems See Subjective, otherwise negative ROS.  Objective:  Ht 5\' 7"  (1.702 m)  Wt 166 lb 3.2 oz (75.388 kg)  BMI 26.02 kg/m2  LMP 11/30/2012  Breastfeeding? No  General:  alert, cooperative and no distress     Lungs: clear to auscultation bilaterally  Heart:  regular rate and rhythm, S1, S2 normal, no murmur  Abdomen: soft, non-tender; bowel sounds normal; no masses,  no organomegaly   Vulva:  normal  Vagina: normal vagina  Cervix:  normal  Corpus: normal size, contour, position, consistency, mobility, non-tender  Adnexa:  normal adnexa

## 2012-12-03 NOTE — Patient Instructions (Signed)
[facility] scheduled for Tuesday the 25th at noon preop for surgery 2:45 pm on the 24th [facility] day surgery

## 2012-12-07 ENCOUNTER — Encounter (HOSPITAL_COMMUNITY)
Admission: RE | Admit: 2012-12-07 | Discharge: 2012-12-07 | Disposition: A | Discharge status: Home / Self Care | Payer: Medicaid Other | Source: Ambulatory Visit | Attending: Obstetrics and Gynecology | Admitting: Obstetrics and Gynecology

## 2012-12-07 ENCOUNTER — Encounter (HOSPITAL_COMMUNITY): Payer: Self-pay | Admitting: Pharmacy Technician

## 2012-12-07 ENCOUNTER — Encounter: Payer: Self-pay | Admitting: Nurse Practitioner

## 2012-12-07 ENCOUNTER — Ambulatory Visit (INDEPENDENT_AMBULATORY_CARE_PROVIDER_SITE_OTHER): Payer: Medicaid Other | Admitting: Nurse Practitioner

## 2012-12-07 ENCOUNTER — Encounter (HOSPITAL_COMMUNITY): Payer: Self-pay

## 2012-12-07 ENCOUNTER — Other Ambulatory Visit: Payer: Self-pay | Admitting: Obstetrics and Gynecology

## 2012-12-07 VITALS — BP 102/72 | Temp 98.7°F | Ht 67.0 in | Wt 172.2 lb

## 2012-12-07 DIAGNOSIS — IMO0001 Reserved for inherently not codable concepts without codable children: Secondary | ICD-10-CM

## 2012-12-07 DIAGNOSIS — M797 Fibromyalgia: Secondary | ICD-10-CM

## 2012-12-07 DIAGNOSIS — J310 Chronic rhinitis: Secondary | ICD-10-CM

## 2012-12-07 DIAGNOSIS — J029 Acute pharyngitis, unspecified: Secondary | ICD-10-CM

## 2012-12-07 LAB — CBC
HCT: 36.4 % (ref 36.0–46.0)
Hemoglobin: 12.2 g/dL (ref 12.0–15.0)
MCH: 30.8 pg (ref 26.0–34.0)
MCHC: 33.5 g/dL (ref 30.0–36.0)
MCV: 91.9 fL (ref 78.0–100.0)
Platelets: 359 K/uL (ref 150–400)
RBC: 3.96 MIL/uL (ref 3.87–5.11)
RDW: 13.1 % (ref 11.5–15.5)
WBC: 8.8 K/uL (ref 4.0–10.5)

## 2012-12-07 LAB — URINE MICROSCOPIC-ADD ON

## 2012-12-07 LAB — BASIC METABOLIC PANEL WITH GFR
BUN: 9 mg/dL (ref 6–23)
CO2: 25 meq/L (ref 19–32)
Calcium: 9.1 mg/dL (ref 8.4–10.5)
Chloride: 107 meq/L (ref 96–112)
Creatinine, Ser: 0.67 mg/dL (ref 0.50–1.10)
GFR calc Af Amer: >90 mL/min
GFR calc non Af Amer: >90 mL/min
Glucose, Bld: 91 mg/dL (ref 70–99)
Potassium: 4 meq/L (ref 3.5–5.1)
Sodium: 141 meq/L (ref 135–145)

## 2012-12-07 LAB — URINALYSIS, ROUTINE W REFLEX MICROSCOPIC
Glucose, UA: NEGATIVE mg/dL
Ketones, ur: NEGATIVE mg/dL
Protein, ur: 30 mg/dL — AB
Urobilinogen, UA: 0.2 mg/dL (ref 0.0–1.0)

## 2012-12-07 LAB — HCG, SERUM, QUALITATIVE: Preg, Serum: NEGATIVE

## 2012-12-07 MED ORDER — OXYCODONE-ACETAMINOPHEN 5-325 MG PO TABS: 2.0000 | ORAL_TABLET | Freq: Three times a day (TID) | ORAL | Status: DC | PRN

## 2012-12-07 MED ORDER — CEFUROXIME AXETIL 500 MG PO TABS: 500.0000 mg | ORAL_TABLET | Freq: Two times a day (BID) | ORAL | Status: DC

## 2012-12-07 NOTE — Patient Instructions (Signed)
[name]  12/07/2012   Your procedure is scheduled on:   12/08/2012 Report to [facility] at  1000 AM.  Call this number if you have problems the morning of surgery: [phone]   Remember:   Do not eat food or drink liquids after midnight.   Take these medicines the morning of surgery with A SIP OF WATER: none  Do not wear jewelry, make-up or nail polish.  Do not wear lotions, powders, or perfumes.   Do not shave 48 hours prior to surgery. Men may shave face and neck.  Do not bring valuables to the hospital.  [facility] is not responsible for any belongings or valuables.               Contacts, dentures or bridgework may not be worn into surgery.  Leave suitcase in the car. After surgery it may be brought to your room.  For patients admitted to the hospital, discharge time is determined by your  treatment team.               Patients discharged the day of surgery will not be allowed to drive home.  Name and phone number of your driver: family  Special Instructions: Shower using CHG 2 nights before surgery and the night before surgery.  If you shower the day of surgery use CHG.  Use special wash - you have one bottle of CHG for all showers.  You should use approximately 1/3 of the bottle for each shower.   Please read over the following fact sheets that you were given: Pain Booklet, Coughing and Deep Breathing, Surgical Site Infection Prevention, Anesthesia Post-op Instructions and Care and Recovery After Surgery Sterilization Information, Female Female sterilization is a procedure to permanently prevent pregnancy. There are different ways to perform sterilization, but all either block or close the fallopian tubes so that your eggs cannot reach your uterus. If your egg cannot reach your uterus, sperm cannot fertilize the egg, and you cannot get pregnant.  Sterilization is performed by a surgical procedure. Sometimes these procedures are performed in a hospital while a  patient is asleep. Sometimes they can be done in a clinic setting with the patient awake. The fallopian tubes can be surgically cut, tied, or sealed through a procedure called tubal ligation. The fallopian tubes can also be closed with clips or rings. Sterilization can also be done by placing a tiny coil into each fallopian tube, which causes scar tissue to grow inside the tube. The scar tissue then blocks the tubes.  Discuss sterilization with your caregiver to answer any concerns you or your partner may have. You may want to ask what type of sterilization your caregiver performs. Some caregivers may not perform all the various options. Sterilization is permanent and should only be done if you are sure you do not want children or do not want any more children. Having a sterilization reversed may not be successful.  STERILIZATION PROCEDURES  Laparoscopic sterilization. This is a surgical method performed at a time other than right after childbirth. Two incisions are made in the lower abdomen. A thin, lighted tube (laparoscope) is inserted into one of the incisions and is used to perform the procedure. The fallopian tubes are closed with a ring or a clip. An instrument that uses heat could be used to seal the tubes closed (electrocautery).   Mini-laparotomy. This is a surgical method done 1 or 2 days after giving birth. Typically,  a small incision is made just below the belly button (umbilicus) and the fallopian tubes are exposed. The tubes can then be sealed, tied, or cut.   Hysteroscopic sterilization. This is performed at a time other than right after childbirth. A tiny, spring-like coil is inserted through the cervix and uterus and placed into the fallopian tubes. The coil causes scaring and blocks the tubes. Other forms of contraception should be used for 3 months after the procedure to allow the scar tissue to form completely. Additionally, it is required hysterosalpingography be done 3 months later  to ensure that the procedure was successful. Hysterosalpingography is a procedure that uses X-rays to look at your uterus and fallopian tubes after a material to make them show up better has been inserted. IS STERILIZATION SAFE? Sterilization is considered safe with very rare complications. Risks depend on the type of procedure you have. As with any surgical procedure, there are risks. Some risks of sterilization by any means include:   Bleeding.  Infection.  Reaction to anesthesia medicine.  Injury to surrounding organs. Risks specific to having hysteroscopic coils placed include:  The coils may not be placed correctly the first time.   The coils may move out of place.   The tubes may not get completely blocked after 3 months.   Injury to surrounding organs when placing the coil.  HOW EFFECTIVE IS FEMALE STERILIZATION? Sterilization is nearly 100% effective, but it can fail. Depending on the type of sterilization, the rate of failure can be as high as 3%. After hysteroscopic sterilization with placement of fallopian tube coils, you will need back-up birth control for 3 months after the procedure. Sterilization is effective for a lifetime.  BENEFITS OF STERILIZATION  It does not affect your hormones, and therefore will not affect your menstrual periods, sexual desire, or performance.   It is effective for a lifetime.   It is safe.   You do not need to worry about getting pregnant. Keep in mind that if you had the hysteroscopic placement procedure, you must wait 3 months after the procedure (or until your caregiver confirms) before pregnancy is not considered possible.   There are no side effects unlike other types of birth control (contraception).  DRAWBACKS OF STERILIZATION  You must be sure you do not want children or any more children. The procedure is permanent.   It does not provide protection against sexually transmitted infections (STIs).   The tubes can  grow back together. If this happens, there is a risk of pregnancy. There is also an increased risk (50%) of pregnancy being an ectopic pregnancy. This is a pregnancy that happens outside of the uterus. Document Released: 06/19/2007 Document Revised: 07/02/2011 Document Reviewed: 04/18/2011 [facility] Patient Information 2014 [place], [place]. Laparoscopic Tubal Ligation Laparoscopic tubal ligation is a procedure that closes the fallopian tubes at a time other than right after childbirth. By closing the fallopian tubes, the eggs that are released from the ovaries cannot enter the uterus and sperm cannot reach the egg. Tubal ligation is also known as getting your "tubes tied." Tubal ligation is done so you will not be able to get pregnant or have a baby.  Although this procedure may be reversed, it should be considered permanent and irreversible. If you want to have future pregnancies, you should not have this procedure.  LET YOUR CAREGIVER KNOW ABOUT:  Allergies to food or medicine.  Medicines taken, including vitamins, herbs, eyedrops, over-the-counter medicines, and creams.  Use of steroids (by mouth  or creams).  Previous problems with numbing medicines.  History of bleeding problems or blood clots.  Any recent colds or infections.  Previous surgery.  Other health problems, including diabetes and kidney problems.  Possibility of pregnancy, if this applies.  Any past pregnancies. RISKS AND COMPLICATIONS   Infection.  Bleeding.  Injury to surrounding organs.  Anesthetic side effects.  Failure of the procedure.  Ectopic pregnancy.  Future regret about having the procedure done. BEFORE THE PROCEDURE  Do not take aspirin or blood thinners a week before the procedure or as directed. This can cause bleeding.  Do not eat or drink anything 6 to 8 hours before the procedure. PROCEDURE   You may be given a medicine to help you relax (sedative) before the procedure. You will be  given a medicine to make you sleep (general anesthetic) during the procedure.  A tube will be put down your throat to help your breath while under general anesthesia.  Two small cuts (incisions) are made in the lower abdominal area and near the belly button.  Your abdominal area will be inflated with a safe gas (carbon dioxide). This helps give the surgeon room to operate, visualize, and helps the surgeon avoid other organs.  A thin, lighted tube (laparoscope) with a camera attached is inserted into your abdomen through one of the incisions near the belly button. Other small instruments are also inserted through the other abdominal incision.  The fallopian tubes are located and are either blocked with a ring, clip, or are burned (cauterized).  After the fallopian tubes are blocked, the gas is released from the abdomen.  The incisions will be closed with stitches (sutures), and a bandage may be placed over the incisions. AFTER THE PROCEDURE   You will rest in a recovery room for 1 4 hours until you are stable and doing well.  You will also have some mild abdominal discomfort for 3 7 days. You will be given pain medicine to ease any discomfort.  As long as there are no problems, you may be allowed to go home. Someone will need to drive you home and be with you for at least 24 hours once home.  You may have some mild discomfort in the throat. This is from the tube placed in your throat while you were sleeping.  You may experience discomfort in the shoulder area from some trapped air between the liver and diaphragm. This sensation is normal and will slowly go away on its own. Document Released: 04/08/2000 Document Revised: 07/02/2011 Document Reviewed: 04/13/2011 [facility] Patient Information 2014 [place], [place]. PATIENT INSTRUCTIONS POST-ANESTHESIA  IMMEDIATELY FOLLOWING SURGERY:  Do not drive or operate machinery for the first twenty four hours after surgery.  Do not make any important  decisions for twenty four hours after surgery or while taking narcotic pain medications or sedatives.  If you develop intractable nausea and vomiting or a severe headache please notify your doctor immediately.  FOLLOW-UP:  Please make an appointment with your surgeon as instructed. You do not need to follow up with anesthesia unless specifically instructed to do so.  WOUND CARE INSTRUCTIONS (if applicable):  Keep a dry clean dressing on the anesthesia/puncture wound site if there is drainage.  Once the wound has quit draining you may leave it open to air.  Generally you should leave the bandage intact for twenty four hours unless there is drainage.  If the epidural site drains for more than 36-48 hours please call the anesthesia department.  QUESTIONS?:  Please feel free to call your physician or the hospital operator if you have any questions, and they will be happy to assist you.

## 2012-12-07 NOTE — H&P (Signed)
[name] is an 27 y.o. female. She is admitted for permanent sterilization, by bilateral salpingectomy. This technique is chosen to reduce future risk of ovarian/tubal cancer. Risks of surgery, including bleeding, injury to adjacent organs, discussed with patient.  Pertinent Gynecological History: Menses: flow is moderate Bleeding: reGular menses Contraception: abstinence DES exposure: denies Blood transfusions: none Sexually transmitted diseases: no past history Previous GYN Procedures:   Last mammogram:  Date:  Last pap: normal Date: 03/18/12 OB History: G3, P3003   Menstrual History: Menarche age: Patient's last menstrual period was 11/30/2012.    Past Medical History  Diagnosis Date  . Other dysphagia   . Esophageal reflux   . Irritable bowel syndrome   . Hyperemesis gravidarum with metabolic disturbance, unspecified as to episode of care   . Anemia, unspecified   . Unspecified constipation   . Headache(784.0)   . Unspecified asthma(493.90)   . Gastroparesis   . Anxiety state, unspecified   . Other and unspecified noninfectious gastroenteritis and colitis(558.9)   . Nausea alone   . Abdominal pain, unspecified site   . Fibromyalgia   . Irritable bowel syndrome   . Fatty liver   . Pregnant   . Disc herniation     causes sciatica unsure which disc  . History of narcotic addiction 09/30/2012    2010:  Per pt, was addicted to narcotics; mom helped intervene and stopped taking opiods     Past Surgical History  Procedure Laterality Date  . Cholecystectomy    . Appendectomy      Family History  Problem Relation Age of Onset  . Ulcerative colitis Paternal Grandmother   . Cancer Paternal Grandmother   . Colon polyps Paternal Grandfather   . Cancer Paternal Grandfather     thyroid  . Diabetes Father   . Heart disease Father   . Kidney disease Father   . Hypertension Father     Social History:  reports that she has been smoking Cigarettes.  She has a 5  pack-year smoking history. She has never used smokeless tobacco. She reports that she does not drink alcohol or use illicit drugs.  Allergies:  Allergies  Allergen Reactions  . Azithromycin     REACTION: unknown reaction  . Nsaids     Unknown   . Penicillins   . Prednisone     Can take shot, but pill form "caused stomach pain , nausea"     (Not in a hospital admission)  ROS  Last menstrual period 11/30/2012. Physical Exam  Constitutional: She is oriented to person, place, and time. She appears well-developed and well-nourished.  HENT:  Head: Normocephalic.  Eyes: Pupils are equal, round, and reactive to light.  Neck: Normal range of motion. Neck supple.  Cardiovascular: Normal rate.   Respiratory: Effort normal.  Genitourinary: Vagina normal and uterus normal.  Musculoskeletal: Normal range of motion.  Neurological: She is alert and oriented to person, place, and time. She has normal reflexes.  Psychiatric: She has a normal mood and affect. Her behavior is normal. Judgment and thought content normal.    No results found for this or any previous visit (from the past 24 hour(s)).  No results found.  Assessment/Plan: Permanent Sterilization by bilateral salpingectomy   [name] V 12/07/2012, 2:16 PM  

## 2012-12-08 ENCOUNTER — Encounter (HOSPITAL_COMMUNITY): Payer: Medicaid Other | Admitting: Anesthesiology

## 2012-12-08 ENCOUNTER — Ambulatory Visit (HOSPITAL_COMMUNITY): Payer: Medicaid Other | Admitting: Anesthesiology

## 2012-12-08 ENCOUNTER — Encounter (HOSPITAL_COMMUNITY)
Admission: RE | Disposition: A | Discharge status: Home / Self Care | Payer: Self-pay | Source: Ambulatory Visit | Attending: Obstetrics and Gynecology

## 2012-12-08 ENCOUNTER — Ambulatory Visit (HOSPITAL_COMMUNITY)
Admission: RE | Admit: 2012-12-08 | Discharge: 2012-12-08 | Disposition: A | Discharge status: Home / Self Care | Payer: Medicaid Other | Source: Ambulatory Visit | Attending: Obstetrics and Gynecology | Admitting: Obstetrics and Gynecology

## 2012-12-08 ENCOUNTER — Encounter (HOSPITAL_COMMUNITY): Payer: Self-pay | Admitting: *Deleted

## 2012-12-08 DIAGNOSIS — Z3009 Encounter for other general counseling and advice on contraception: Secondary | ICD-10-CM

## 2012-12-08 DIAGNOSIS — Z302 Encounter for sterilization: Secondary | ICD-10-CM | POA: Insufficient documentation

## 2012-12-08 HISTORY — PX: LAPAROSCOPIC BILATERAL SALPINGECTOMY: SHX5889

## 2012-12-08 SURGERY — SALPINGECTOMY, BILATERAL, LAPAROSCOPIC
Anesthesia: General | Site: Abdomen | Laterality: Bilateral | Wound class: Clean

## 2012-12-08 MED ORDER — OXYCODONE-ACETAMINOPHEN 5-325 MG PO TABS: 1.0000 | ORAL_TABLET | ORAL | Status: DC | PRN

## 2012-12-08 MED ORDER — MIDAZOLAM HCL 2 MG/2ML IJ SOLN
INTRAMUSCULAR | Status: AC
  Filled 2012-12-08: qty 2

## 2012-12-08 MED ORDER — NEOSTIGMINE METHYLSULFATE 1 MG/ML IJ SOLN
INTRAMUSCULAR | Status: AC
  Filled 2012-12-08: qty 1

## 2012-12-08 MED ORDER — ROCURONIUM BROMIDE 100 MG/10ML IV SOLN: INTRAVENOUS | Status: DC | PRN

## 2012-12-08 MED ORDER — BUPIVACAINE HCL (PF) 0.5 % IJ SOLN
INTRAMUSCULAR | Status: AC
  Filled 2012-12-08: qty 30

## 2012-12-08 MED ORDER — FENTANYL CITRATE 0.05 MG/ML IJ SOLN
INTRAMUSCULAR | Status: AC
  Filled 2012-12-08: qty 2

## 2012-12-08 MED ORDER — GLYCOPYRROLATE 0.2 MG/ML IJ SOLN
INTRAMUSCULAR | Status: DC | PRN
  Administered 2012-12-08: .6 mg via INTRAVENOUS

## 2012-12-08 MED ORDER — ROCURONIUM BROMIDE 50 MG/5ML IV SOLN
INTRAVENOUS | Status: AC
  Filled 2012-12-08: qty 1

## 2012-12-08 MED ORDER — FENTANYL CITRATE 0.05 MG/ML IJ SOLN
INTRAMUSCULAR | Status: DC | PRN
  Administered 2012-12-08 (×4): 50 ug via INTRAVENOUS

## 2012-12-08 MED ORDER — BUPIVACAINE HCL (PF) 0.5 % IJ SOLN
INTRAMUSCULAR | Status: DC | PRN
  Administered 2012-12-08: 10 mL

## 2012-12-08 MED ORDER — GLYCOPYRROLATE 0.2 MG/ML IJ SOLN
INTRAMUSCULAR | Status: AC
  Filled 2012-12-08: qty 2

## 2012-12-08 MED ORDER — FENTANYL CITRATE 0.05 MG/ML IJ SOLN
INTRAMUSCULAR | Status: AC
  Filled 2012-12-08: qty 5

## 2012-12-08 MED ORDER — ONDANSETRON HCL 4 MG/2ML IJ SOLN
4.0000 mg | Freq: Once | INTRAMUSCULAR | Status: AC
  Administered 2012-12-08: 4 mg via INTRAVENOUS

## 2012-12-08 MED ORDER — 0.9 % SODIUM CHLORIDE (POUR BTL) OPTIME
TOPICAL | Status: DC | PRN
  Administered 2012-12-08: 1000 mL

## 2012-12-08 MED ORDER — PROPOFOL 10 MG/ML IV BOLUS
INTRAVENOUS | Status: AC
  Filled 2012-12-08: qty 20

## 2012-12-08 MED ORDER — KETOROLAC TROMETHAMINE 30 MG/ML IJ SOLN
INTRAMUSCULAR | Status: AC
  Filled 2012-12-08: qty 1

## 2012-12-08 MED ORDER — LACTATED RINGERS IV SOLN
INTRAVENOUS | Status: DC
  Administered 2012-12-08: 1000 mL via INTRAVENOUS

## 2012-12-08 MED ORDER — FENTANYL CITRATE 0.05 MG/ML IJ SOLN
25.0000 ug | INTRAMUSCULAR | Status: DC | PRN
  Administered 2012-12-08 (×4): 50 ug via INTRAVENOUS

## 2012-12-08 MED ORDER — NEOSTIGMINE METHYLSULFATE 1 MG/ML IJ SOLN
INTRAMUSCULAR | Status: DC | PRN
  Administered 2012-12-08: 3 mg via INTRAVENOUS

## 2012-12-08 MED ORDER — ROCURONIUM BROMIDE 100 MG/10ML IV SOLN
INTRAVENOUS | Status: DC | PRN
  Administered 2012-12-08: 10 mg via INTRAVENOUS
  Administered 2012-12-08: 5 mg via INTRAVENOUS
  Administered 2012-12-08: 25 mg via INTRAVENOUS

## 2012-12-08 MED ORDER — ONDANSETRON HCL 4 MG/2ML IJ SOLN
INTRAMUSCULAR | Status: AC
  Filled 2012-12-08: qty 2

## 2012-12-08 MED ORDER — PROPOFOL 10 MG/ML IV BOLUS
INTRAVENOUS | Status: DC | PRN
  Administered 2012-12-08: 100 mg via INTRAVENOUS

## 2012-12-08 MED ORDER — FENTANYL CITRATE 0.05 MG/ML IJ SOLN
25.0000 ug | INTRAMUSCULAR | Status: AC
  Administered 2012-12-08 (×2): 25 ug via INTRAVENOUS

## 2012-12-08 MED ORDER — MIDAZOLAM HCL 2 MG/2ML IJ SOLN
1.0000 mg | INTRAMUSCULAR | Status: DC | PRN
  Administered 2012-12-08: 2 mg via INTRAVENOUS

## 2012-12-08 MED ORDER — ONDANSETRON HCL 4 MG/2ML IJ SOLN: 4.0000 mg | Freq: Once | INTRAMUSCULAR | Status: DC | PRN

## 2012-12-08 MED ORDER — LIDOCAINE HCL 1 % IJ SOLN
INTRAMUSCULAR | Status: DC | PRN
  Administered 2012-12-08: 30 mg via INTRADERMAL

## 2012-12-08 MED ORDER — GLYCOPYRROLATE 0.2 MG/ML IJ SOLN
INTRAMUSCULAR | Status: AC
  Filled 2012-12-08: qty 1

## 2012-12-08 MED ORDER — KETOROLAC TROMETHAMINE 30 MG/ML IJ SOLN: 30.0000 mg | Freq: Once | INTRAMUSCULAR | Status: DC

## 2012-12-08 SURGICAL SUPPLY — 49 items
BAG HAMPER (MISCELLANEOUS) ×2 IMPLANT
BAG SPEC RTRVL LRG 6X4 10 (ENDOMECHANICALS)
BLADE SURG SZ11 CARB STEEL (BLADE) ×2 IMPLANT
CLOTH BEACON ORANGE TIMEOUT ST (SAFETY) ×2 IMPLANT
COVER LIGHT HANDLE STERIS (MISCELLANEOUS) ×4 IMPLANT
DRESSING COVERLET 3X1 FLEXIBLE (GAUZE/BANDAGES/DRESSINGS) ×6 IMPLANT
DURAPREP 26ML APPLICATOR (WOUND CARE) ×2 IMPLANT
ELECT REM PT RETURN 9FT ADLT (ELECTROSURGICAL) ×2
ELECTRODE REM PT RTRN 9FT ADLT (ELECTROSURGICAL) ×1 IMPLANT
FILTER SMOKE EVAC LAPAROSHD (FILTER) ×2 IMPLANT
GLOVE BIOGEL PI IND STRL 7.0 (GLOVE) IMPLANT
GLOVE BIOGEL PI IND STRL 7.5 (GLOVE) IMPLANT
GLOVE BIOGEL PI IND STRL 9 (GLOVE) ×2 IMPLANT
GLOVE BIOGEL PI INDICATOR 7.0 (GLOVE) ×1
GLOVE BIOGEL PI INDICATOR 7.5 (GLOVE) ×1
GLOVE BIOGEL PI INDICATOR 9 (GLOVE) ×2
GLOVE ECLIPSE 9.0 STRL (GLOVE) ×2 IMPLANT
GLOVE EXAM NITRILE LRG STRL (GLOVE) ×1 IMPLANT
GLOVE SS BIOGEL STRL SZ 6.5 (GLOVE) IMPLANT
GLOVE SUPERSENSE BIOGEL SZ 6.5 (GLOVE) ×1
GOWN STRL REIN 3XL LVL4 (GOWN DISPOSABLE) ×2 IMPLANT
GOWN STRL REIN XL XLG (GOWN DISPOSABLE) ×2 IMPLANT
INST SET LAPROSCOPIC GYN AP (KITS) ×2 IMPLANT
KIT ROOM TURNOVER APOR (KITS) ×2 IMPLANT
LIGASURE 5MM LAPAROSCOPIC (INSTRUMENTS) IMPLANT
NDL HYPO 25X1 1.5 SAFETY (NEEDLE) IMPLANT
NEEDLE HYPO 25X1 1.5 SAFETY (NEEDLE) ×2 IMPLANT
NEEDLE INSUFFLATION 120MM (ENDOMECHANICALS) ×2 IMPLANT
NS IRRIG 1000ML POUR BTL (IV SOLUTION) ×2 IMPLANT
PACK PERI GYN (CUSTOM PROCEDURE TRAY) ×2 IMPLANT
PAD ARMBOARD 7.5X6 YLW CONV (MISCELLANEOUS) ×2 IMPLANT
POUCH SPECIMEN RETRIEVAL 10MM (ENDOMECHANICALS) IMPLANT
SCALPEL HARMONIC ACE (MISCELLANEOUS) IMPLANT
SET BASIN LINEN APH (SET/KITS/TRAYS/PACK) ×2 IMPLANT
SET IRRIG TUBING LAPAROSCOPIC (IRRIGATION / IRRIGATOR) IMPLANT
SOLUTION ANTI FOG 6CC (MISCELLANEOUS) ×2 IMPLANT
STRIP CLOSURE SKIN 1/2X4 (GAUZE/BANDAGES/DRESSINGS) ×2 IMPLANT
SUT VIC AB 4-0 PS2 27 (SUTURE) ×2 IMPLANT
SUT VICRYL 0 UR6 27IN ABS (SUTURE) ×2 IMPLANT
SYR BULB IRRIGATION 50ML (SYRINGE) ×2 IMPLANT
SYR CONTROL 10ML LL (SYRINGE) ×1 IMPLANT
SYRINGE 10CC LL (SYRINGE) ×2 IMPLANT
TRAY FOLEY CATH 16FR SILVER (SET/KITS/TRAYS/PACK) ×1 IMPLANT
TROCAR ENDO BLADELESS 11MM (ENDOMECHANICALS) ×2 IMPLANT
TROCAR XCEL NON-BLD 5MMX100MML (ENDOMECHANICALS) ×2 IMPLANT
TROCAR XCEL UNIV SLVE 11M 100M (ENDOMECHANICALS) ×2 IMPLANT
TUBING INSUFFLATION (TUBING) ×2 IMPLANT
WARMER LAPAROSCOPE (MISCELLANEOUS) ×2 IMPLANT
WATER STERILE IRR 1000ML POUR (IV SOLUTION) ×2 IMPLANT

## 2012-12-08 NOTE — H&P (View-Only) (Signed)
[name] is an 27 y.o. female. She is admitted for permanent sterilization, by bilateral salpingectomy. This technique is chosen to reduce future risk of ovarian/tubal cancer. Risks of surgery, including bleeding, injury to adjacent organs, discussed with patient.  Pertinent Gynecological History: Menses: flow is moderate Bleeding: reGular menses Contraception: abstinence DES exposure: denies Blood transfusions: none Sexually transmitted diseases: no past history Previous GYN Procedures:   Last mammogram:  Date:  Last pap: normal Date: 03/18/12 OB History: G3, P3003   Menstrual History: Menarche age: Patient's last menstrual period was 11/30/2012.    Past Medical History  Diagnosis Date  . Other dysphagia   . Esophageal reflux   . Irritable bowel syndrome   . Hyperemesis gravidarum with metabolic disturbance, unspecified as to episode of care   . Anemia, unspecified   . Unspecified constipation   . Headache(784.0)   . Unspecified asthma(493.90)   . Gastroparesis   . Anxiety state, unspecified   . Other and unspecified noninfectious gastroenteritis and colitis(558.9)   . Nausea alone   . Abdominal pain, unspecified site   . Fibromyalgia   . Irritable bowel syndrome   . Fatty liver   . Pregnant   . Disc herniation     causes sciatica unsure which disc  . History of narcotic addiction 09/30/2012    2010:  Per pt, was addicted to narcotics; mom helped intervene and stopped taking opiods     Past Surgical History  Procedure Laterality Date  . Cholecystectomy    . Appendectomy      Family History  Problem Relation Age of Onset  . Ulcerative colitis Paternal Grandmother   . Cancer Paternal Grandmother   . Colon polyps Paternal Grandfather   . Cancer Paternal Grandfather     thyroid  . Diabetes Father   . Heart disease Father   . Kidney disease Father   . Hypertension Father     Social History:  reports that she has been smoking Cigarettes.  She has a 5  pack-year smoking history. She has never used smokeless tobacco. She reports that she does not drink alcohol or use illicit drugs.  Allergies:  Allergies  Allergen Reactions  . Azithromycin     REACTION: unknown reaction  . Nsaids     Unknown   . Penicillins   . Prednisone     Can take shot, but pill form "caused stomach pain , nausea"     (Not in a hospital admission)  ROS  Last menstrual period 11/30/2012. Physical Exam  Constitutional: She is oriented to person, place, and time. She appears well-developed and well-nourished.  HENT:  Head: Normocephalic.  Eyes: Pupils are equal, round, and reactive to light.  Neck: Normal range of motion. Neck supple.  Cardiovascular: Normal rate.   Respiratory: Effort normal.  Genitourinary: Vagina normal and uterus normal.  Musculoskeletal: Normal range of motion.  Neurological: She is alert and oriented to person, place, and time. She has normal reflexes.  Psychiatric: She has a normal mood and affect. Her behavior is normal. Judgment and thought content normal.    No results found for this or any previous visit (from the past 24 hour(s)).  No results found.  Assessment/Plan: Permanent Sterilization by bilateral salpingectomy   [name] V 12/07/2012, 2:16 PM

## 2012-12-08 NOTE — Interval H&P Note (Signed)
History and Physical Interval Note:  12/08/2012 11:39 AM  [name]  has presented today for surgery, with the diagnosis of request sterilization  The various methods of treatment have been discussed with the patient and family. After consideration of risks, benefits and other options for treatment, the patient has consented to  Procedure(s): LAPAROSCOPIC BILATERAL SALPINGECTOMY (Bilateral) as a surgical intervention .  The patient's history has been reviewed, patient examined, no change in status, stable for surgery.  I have reviewed the patient's chart and labs.  Questions were answered to the patient's satisfaction.     Tilda Burrow

## 2012-12-08 NOTE — Brief Op Note (Signed)
12/08/2012  12:53 PM  PATIENT:  [name]  27 y.o. female  PRE-OPERATIVE DIAGNOSIS:  request sterilization  POST-OPERATIVE DIAGNOSIS:  request sterilization  PROCEDURE:  Procedure(s): LAPAROSCOPIC BILATERAL SALPINGECTOMY (Bilateral)  SURGEON:  Surgeon(s) and Role:    * Tilda Burrow, MD - Primary  PHYSICIAN ASSISTANT:   ASSISTANTS: Kendrick CST-FA   ANESTHESIA:   general  EBL:  Total I/O In: 500 [I.V.:500] Out: 50 [Urine:50]  BLOOD ADMINISTERED:none  DRAINS: none   LOCAL MEDICATIONS USED:  MARCAINE    and Amount: 10 ml  SPECIMEN:  Source of Specimen:  Bilateral fallopian tubes, suture in right fallopian tube  DISPOSITION OF SPECIMEN:  PATHOLOGY  COUNTS:  YES  TOURNIQUET:  * No tourniquets in log *  DICTATION: .Dragon Dictation  PLAN OF CARE: Discharge to home after PACU  PATIENT DISPOSITION:  PACU - hemodynamically stable.   Delay start of Pharmacological VTE agent (>24hrs) due to surgical blood loss or risk of bleeding: not applicable

## 2012-12-08 NOTE — Anesthesia Postprocedure Evaluation (Signed)
  Anesthesia Post-op Note  Patient: [name]  Procedure(s) Performed: Procedure(s): LAPAROSCOPIC BILATERAL SALPINGECTOMY (Bilateral)  Patient Location: PACU  Anesthesia Type:General  Level of Consciousness: awake, alert  and oriented  Airway and Oxygen Therapy: Patient Spontanous Breathing  Post-op Pain: none  Post-op Assessment: Post-op Vital signs reviewed, Patient's Cardiovascular Status Stable, Respiratory Function Stable, Patent Airway and No signs of Nausea or vomiting  Post-op Vital Signs: Reviewed and stable  Complications: No apparent anesthesia complications

## 2012-12-08 NOTE — Op Note (Signed)
12/08/2012  12:53 PM  PATIENT:  [name]  27 y.o. female  PRE-OPERATIVE DIAGNOSIS:  request sterilization  POST-OPERATIVE DIAGNOSIS:  request sterilization  PROCEDURE:  Procedure(s): LAPAROSCOPIC BILATERAL SALPINGECTOMY (Bilateral)  SURGEON:  Surgeon(s) and Role:    * Tilda Burrow, MD - Primary  PHYSICIAN ASSISTANT:   ASSISTANTS: Kendrick CST-FA   Details of procedure: Patient was taken to the operating room prepped and draped for an abdominal procedure with vaginal prepping and Foley catheter in place. Timeout was conducted and procedure confirmed by surgical team. Next an infraumbilical vertical 1 cm skin incision was made as well as a transverse suprapubic 1 cm incision in the right lower quadrant incision of similar length patient abdomen identified sacral promontory, the needle tip was directed toward the pelvis below the sacral promontory, and peritoneum carefully entered using Veress needle, water droplet technique used to confirm intraperitoneal location and pneumoperitoneum achieved using 3 L CO2 infusion with pressures of 13 mm maximal . Laparoscopic trocar was introduced through the umbilicus and attention directed the pelvis. Photos were documented in the normal anatomy there was some retrograde menstruation visible in the pelvis. Suprapubic and right lower quadrant trochars were placed under direct visualization, then the grasper placed through the suprapubic site, and the unipolar hook cautery device introduced under direct visualization through the right lower quadrant trocar site and attention directed to the left fallopian tube. With the patient in Trendelenburg position, with the bowel well away from the surgical field, attention was directed to the left fallopian tube which was elevated and the mesosalpinx transected using unipolar cautery. Some point cautery was necessary along the mesosalpinx pedicle, particularly laterally, but at no time was the bowel anywhere  near the surgical field. The right tube was treated in a similar fashion, with an easy resection, and photos were taken to document both operative areas. Deflation of the abdomen was performed after removal laparoscopic equipment and saline solution 120 cc was instilled into the abdomen prior to removal of laparoscopic trochars. Closure of the fascia was performed at suprapubic and umbilical site using S. retractors to visualize the fascia which was grasped with Allis clamps and then ligated with 0 Vicryl. The 3 puncture sites were then closed subcuticularly with 4-0 Vicryl, Steri-Strips and Band-Aids applied and patient allowed to go recovery room in good condition. Counts were correct. Marcaine was used around each trocar site prior to completion of the procedure for improved analgesia. Patient recovered in stable condition sponge and needle counts correct

## 2012-12-08 NOTE — Transfer of Care (Signed)
Immediate Anesthesia Transfer of Care Note  Patient: [name]  Procedure(s) Performed: Procedure(s): LAPAROSCOPIC BILATERAL SALPINGECTOMY (Bilateral)  Patient Location: PACU  Anesthesia Type:General  Level of Consciousness: awake, alert  and oriented  Airway & Oxygen Therapy: Patient Spontanous Breathing and Patient connected to face mask oxygen  Post-op Assessment: Report given to PACU RN  Post vital signs: Reviewed and stable  Complications: No apparent anesthesia complications

## 2012-12-08 NOTE — Anesthesia Preprocedure Evaluation (Signed)
Anesthesia Evaluation  Patient identified by MRN, date of birth, ID band Patient awake    Reviewed: Allergy & Precautions, H&P , NPO status , Patient's Chart, lab work & pertinent test results  Airway Mallampati: II TM Distance: >3 FB Neck ROM: full    Dental  (+) Teeth Intact   Pulmonary asthma , Current Smoker,  breath sounds clear to auscultation        Cardiovascular negative cardio ROS  Rhythm:regular Rate:Normal     Neuro/Psych  Headaches, Anxiety  Neuromuscular disease    GI/Hepatic GERD-  ,  Endo/Other    Renal/GU      Musculoskeletal  (+) Fibromyalgia -  Abdominal   Peds  Hematology   Anesthesia Other Findings       Reproductive/Obstetrics (+) Pregnancy                           Anesthesia Physical Anesthesia Plan  ASA: II  Anesthesia Plan: General   Post-op Pain Management:    Induction: Intravenous, Rapid sequence and Cricoid pressure planned  Airway Management Planned: Oral ETT  Additional Equipment:   Intra-op Plan:   Post-operative Plan: Extubation in OR  Informed Consent: I have reviewed the patients History and Physical, chart, labs and discussed the procedure including the risks, benefits and alternatives for the proposed anesthesia with the patient or authorized representative who has indicated his/her understanding and acceptance.     Plan Discussed with:   Anesthesia Plan Comments:         Anesthesia Quick Evaluation

## 2012-12-09 ENCOUNTER — Encounter: Payer: Self-pay | Admitting: Nurse Practitioner

## 2012-12-09 ENCOUNTER — Other Ambulatory Visit: Payer: Self-pay | Admitting: Obstetrics and Gynecology

## 2012-12-09 ENCOUNTER — Telehealth: Payer: Self-pay | Admitting: Obstetrics and Gynecology

## 2012-12-09 DIAGNOSIS — N390 Urinary tract infection, site not specified: Secondary | ICD-10-CM

## 2012-12-09 LAB — URINE CULTURE

## 2012-12-09 MED ORDER — NITROFURANTOIN MONOHYD MACRO 100 MG PO CAPS: 100.0000 mg | ORAL_CAPSULE | Freq: Two times a day (BID) | ORAL | Status: DC

## 2012-12-09 MED ORDER — CEPHALEXIN 500 MG PO CAPS: 500.0000 mg | ORAL_CAPSULE | Freq: Four times a day (QID) | ORAL | Status: DC

## 2012-12-09 NOTE — Progress Notes (Signed)
Subjective:  Presents for c/o sore throat x 1 week. No fever. Facial area headache. No ear pain. No cough or runny nose. Slight wheeze at times. Smokes 1/2 ppd. Taking fluids well. Voiding normal. No rash. No acid reflux. Also requests refill on her pain medication for fibromyalgia. Usually takes 1-2 per day, rarely 3. Worst pain is in AM 8/10. Has to lay in hot water for about an hour. After pain med, 3-4/10. Second dose around 2:30 7/10 pain level. After medication 3/10.   Objective:   BP 102/72  Temp(Src) 98.7 F (37.1 C)  Ht 5\' 7"  (1.702 m)  Wt 172 lb 3.2 oz (78.109 kg)  BMI 26.96 kg/m2  LMP 12/04/2012 NAD. Alert,oriented. TMs: clear effusion. Pharynx injected with PND noted. Neck supple with mild adenopathy. Lungs clear. Heart RRR.   Assessment: Acute pharyngitis  Fibromyalgia  Rhinitis  Plan:  Meds ordered this encounter  Medications  . DISCONTD: cefUROXime (CEFTIN) 500 MG tablet    Sig: Take 1 tablet (500 mg total) by mouth 2 (two) times daily with a meal.    Dispense:  20 tablet    Refill:  0    Order Specific Question:  Supervising Provider    Answer:  Merlyn Albert [[id number]]  . oxyCODONE-acetaminophen (PERCOCET) 5-325 MG per tablet    Sig: Take 2 tablets by mouth every 8 (eight) hours as needed for severe pain.    Dispense:  90 tablet    Refill:  0    Order Specific Question:  Supervising Provider    Answer:  Merlyn Albert [[id number]]  OTC meds as directed for congestion. Call back by end of week if no improvement. Recommend office visit for fibromyalgia.

## 2012-12-09 NOTE — Assessment & Plan Note (Signed)
Continue Percocet as directed. Given one refill today. Recommend office visit for fibromyalgia and chronic pain management.

## 2012-12-09 NOTE — Telephone Encounter (Signed)
Keflex called in for klebsiella pneumonia. Message left on pt phone

## 2012-12-15 ENCOUNTER — Telehealth: Payer: Self-pay | Admitting: *Deleted

## 2012-12-15 NOTE — Telephone Encounter (Signed)
Message copied by Criss Alvine on Tue Dec 15, 2012  9:12 AM ------      Message from: Tilda Burrow      Created: Wed Dec 09, 2012  6:25 AM       UTI, culture positive, sensitivities pending, will treat with macrobid., sent to pharmacy of record, pt to be notified ------

## 2012-12-15 NOTE — Telephone Encounter (Signed)
Pt aware abx e-scribed for UTI.

## 2012-12-15 NOTE — Telephone Encounter (Signed)
Message copied by Criss Alvine on Tue Dec 15, 2012  9:27 AM ------      Message from: Tilda Burrow      Created: Wed Dec 09, 2012  6:25 AM       UTI, culture positive, sensitivities pending, will treat with macrobid., sent to pharmacy of record, pt to be notified ------

## 2012-12-23 ENCOUNTER — Encounter: Payer: Medicaid Other | Admitting: Obstetrics and Gynecology

## 2012-12-28 ENCOUNTER — Encounter: Payer: Medicaid Other | Admitting: Obstetrics and Gynecology

## 2013-01-01 ENCOUNTER — Telehealth: Payer: Self-pay | Admitting: Family Medicine

## 2013-01-01 NOTE — Telephone Encounter (Signed)
Needs refill on Oxycodone 5/325mg   Will run out 01/07/13.  Please call when ready [phone]

## 2013-01-01 NOTE — Telephone Encounter (Signed)
appt 01/05/13 @ 9:30 with Dr. Brett Canales

## 2013-01-01 NOTE — Telephone Encounter (Signed)
Last office visit 12/07/12 for sick visit

## 2013-01-01 NOTE — Telephone Encounter (Signed)
Left message on voicemail notifying patient the she needs to schedule an appointment with Dr. Brett Canales ONLY before oxycodone can be refilled.

## 2013-01-05 ENCOUNTER — Encounter (HOSPITAL_COMMUNITY): Payer: Self-pay | Admitting: Emergency Medicine

## 2013-01-05 ENCOUNTER — Ambulatory Visit (INDEPENDENT_AMBULATORY_CARE_PROVIDER_SITE_OTHER): Payer: Medicaid Other | Admitting: Family Medicine

## 2013-01-05 ENCOUNTER — Encounter: Payer: Self-pay | Admitting: Family Medicine

## 2013-01-05 ENCOUNTER — Emergency Department (HOSPITAL_COMMUNITY)
Admission: EM | Admit: 2013-01-05 | Discharge: 2013-01-05 | Disposition: A | Discharge status: Home / Self Care | Payer: Medicaid Other | Attending: Emergency Medicine | Admitting: Emergency Medicine

## 2013-01-05 VITALS — BP 110/68 | Ht 68.0 in | Wt 169.0 lb

## 2013-01-05 DIAGNOSIS — G8929 Other chronic pain: Secondary | ICD-10-CM | POA: Insufficient documentation

## 2013-01-05 DIAGNOSIS — Z79899 Other long term (current) drug therapy: Secondary | ICD-10-CM | POA: Insufficient documentation

## 2013-01-05 DIAGNOSIS — Z765 Malingerer [conscious simulation]: Secondary | ICD-10-CM | POA: Insufficient documentation

## 2013-01-05 DIAGNOSIS — M545 Low back pain, unspecified: Secondary | ICD-10-CM | POA: Insufficient documentation

## 2013-01-05 DIAGNOSIS — Z760 Encounter for issue of repeat prescription: Secondary | ICD-10-CM | POA: Insufficient documentation

## 2013-01-05 DIAGNOSIS — J45909 Unspecified asthma, uncomplicated: Secondary | ICD-10-CM | POA: Insufficient documentation

## 2013-01-05 DIAGNOSIS — F17200 Nicotine dependence, unspecified, uncomplicated: Secondary | ICD-10-CM | POA: Insufficient documentation

## 2013-01-05 DIAGNOSIS — Z8719 Personal history of other diseases of the digestive system: Secondary | ICD-10-CM | POA: Insufficient documentation

## 2013-01-05 DIAGNOSIS — F411 Generalized anxiety disorder: Secondary | ICD-10-CM | POA: Insufficient documentation

## 2013-01-05 DIAGNOSIS — G894 Chronic pain syndrome: Secondary | ICD-10-CM

## 2013-01-05 DIAGNOSIS — R5381 Other malaise: Secondary | ICD-10-CM | POA: Insufficient documentation

## 2013-01-05 DIAGNOSIS — Z880 Allergy status to penicillin: Secondary | ICD-10-CM | POA: Insufficient documentation

## 2013-01-05 DIAGNOSIS — Z862 Personal history of diseases of the blood and blood-forming organs and certain disorders involving the immune mechanism: Secondary | ICD-10-CM | POA: Insufficient documentation

## 2013-01-05 NOTE — ED Notes (Signed)
Out of neurontin and oxycodone 5/325 for her fibromyalgia. Nad.

## 2013-01-05 NOTE — Progress Notes (Signed)
   Subjective:    Patient ID: [name], female    DOB: [birth year], 27 y.o.   MRN: [id number]  HPIHere for a med check. Needs refill on meds. No concerns.   Needs refill on meds, dr Tennis Must needs pain ref, cannot afford [facility]  Back and leg pain, some times both, but generally the left leg hurts  Fibromyalgia pain--overall stable on neurontin, and other pain med, better than lyrica  wlks regularly ,  Patient has a history of substance abuse, she admits to this readily.  Patient states she has no problem with pain medicine abuse at this time.      Review of Systems No chest pain no abdominal pain no change in bowel habits chronic low back pain. Chronic pain in her joints and muscles. ROS otherwise negative    Objective:   Physical Exam  Alert no apparent distress. Lungs clear. Heart regular in rhythm. H&T normal. Blood pressure good on repeat.      Assessment & Plan:  Impression chronic pain patient states secondary to partial ruptured disc and fibromyalgia. Has gone the pain clinics in the past. States categorically that she has no problem with pain medications. During the visit here for sinus infection with our nurse practitioner on November 24 she asked for 90 Percocet. Our nurse practitioner gave this to her. She failed to mention that 12 days previous she got 60 Percocets in the emergency room. She felt to mention to that the ER doctor 7 days previous she got 60 hydrocodone extra strength a Dr. Rayna Sexton. Then she proceeded to give 20 more oxycodone this from Dr. Emelda Fear December 2 only 8 days after receiving the numbers from her. We'll research this along with study in all of the old records and ER notes. When confronted with this patient denies that she is taking all of these meds, even though we get this from the Registry. She declines my strong encouragement to pursue drug use counseling. She requests that we refer her to a pain specialist in the  community. I will do this, but we will also discharge her from the practice to 2 failure to follow pain contract as signed by patient one year ago. Easily 40 minutes spent most in discussion of this challenging situation WSL addendum I was called 30 minutes after the patient departed our practice by the emergency room. The nurse practitioner there stated that the patient had arrived complaining of pain, and had not mention her visit with me earlier. WSL

## 2013-01-05 NOTE — ED Provider Notes (Signed)
CSN: [id number]     Arrival date & time 01/05/13  1014 History   First MD Initiated Contact with Patient 01/05/13 1021     Chief Complaint  Patient presents with  . Medication Refill   (Consider location/radiation/quality/duration/timing/severity/associated sxs/prior Treatment) HPI Comments: [name] is a 27 y.o. female who presents to the Emergency Department requesting refills for her chronic low back pain and fibromyalgia.  States she takes neurontin and percocet which she has ran out of.  She states that she does not have a PMD.  She denies any incontinence of bladder or bowel , fever, chills or dysuria.  The history is provided by the patient.    Past Medical History  Diagnosis Date  . Other dysphagia   . Esophageal reflux   . Irritable bowel syndrome   . Hyperemesis gravidarum with metabolic disturbance, unspecified as to episode of care   . Anemia, unspecified   . Unspecified constipation   . Headache(784.0)   . Unspecified asthma(493.90)   . Gastroparesis   . Anxiety state, unspecified   . Other and unspecified noninfectious gastroenteritis and colitis(558.9)   . Nausea alone   . Abdominal pain, unspecified site   . Fibromyalgia   . Irritable bowel syndrome   . Fatty liver   . Pregnant   . Disc herniation     causes sciatica unsure which disc  . History of narcotic addiction 09/30/2012    2010:  Per pt, was addicted to narcotics; mom helped intervene and stopped taking opiods    Past Surgical History  Procedure Laterality Date  . Cholecystectomy    . Appendectomy    . Laparoscopic bilateral salpingectomy Bilateral 12/08/2012    Procedure: LAPAROSCOPIC BILATERAL SALPINGECTOMY;  Surgeon: Tilda Burrow, MD;  Location: [facility];  Service: Gynecology;  Laterality: Bilateral;  . Tubal ligation     Family History  Problem Relation Age of Onset  . Ulcerative colitis Paternal Grandmother   . Cancer Paternal Grandmother   . Colon polyps Paternal Grandfather   .  Cancer Paternal Grandfather     thyroid  . Diabetes Father   . Heart disease Father   . Kidney disease Father   . Hypertension Father    History  Substance Use Topics  . Smoking status: Current Every Day Smoker -- 0.50 packs/day for 10 years    Types: Cigarettes  . Smokeless tobacco: Never Used  . Alcohol Use: No   OB History   Grav Para Term Preterm Abortions TAB SAB Ect Mult Living   3 3 3       3      Review of Systems  Constitutional: Negative for fever.  Respiratory: Negative for shortness of breath.   Gastrointestinal: Negative for vomiting, abdominal pain and constipation.  Genitourinary: Negative for dysuria, hematuria, flank pain, decreased urine volume and difficulty urinating.       No perineal numbness or incontinence of urine or feces  Musculoskeletal: Positive for back pain. Negative for joint swelling.  Skin: Negative for rash.  Neurological: Negative for weakness and numbness.  All other systems reviewed and are negative.    Allergies  Azithromycin; Hydrocodone; Nsaids; Penicillins; and Prednisone  Home Medications   Current Outpatient Rx  Name  Route  Sig  Dispense  Refill  . gabapentin (NEURONTIN) 300 MG capsule   Oral   Take 1 capsule (300 mg total) by mouth 3 (three) times daily.   90 capsule   3   . oxyCODONE-acetaminophen (PERCOCET) 5-325  MG per tablet   Oral   Take 2 tablets by mouth every 8 (eight) hours as needed for severe pain.   90 tablet   0   . oxyCODONE-acetaminophen (PERCOCET/ROXICET) 5-325 MG per tablet   Oral   Take 1 tablet by mouth every 4 (four) hours as needed.   20 tablet   0    BP 111/82  Pulse 74  Temp(Src) 98.1 F (36.7 C) (Oral)  Resp 19  SpO2 98%  LMP 12/01/2012 Physical Exam  Nursing note and vitals reviewed. Constitutional: She is oriented to person, place, and time. She appears well-developed and well-nourished. No distress.  HENT:  Head: Normocephalic and atraumatic.  Neck: Normal range of motion.  Neck supple.  Cardiovascular: Normal rate, regular rhythm, normal heart sounds and intact distal pulses.   No murmur heard. Pulmonary/Chest: Effort normal and breath sounds normal. No respiratory distress.  Abdominal: Soft. She exhibits no distension. There is no tenderness. There is no rebound and no guarding.  Musculoskeletal: She exhibits tenderness. She exhibits no edema.       Lumbar back: She exhibits tenderness and pain. She exhibits normal range of motion, no swelling, no deformity, no laceration and normal pulse.  Diffuse ttp of the lumbar paraspinal muscles.  No spinal tenderness.  DP pulses are brisk and symmetrical.  Distal sensation intact.  Hip Flexors/Extensors are intact  Neurological: She is alert and oriented to person, place, and time. She has normal strength. No sensory deficit. She exhibits normal muscle tone. Coordination and gait normal.  Reflex Scores:      Patellar reflexes are 2+ on the right side and 2+ on the left side.      Achilles reflexes are 2+ on the right side and 2+ on the left side. Skin: Skin is warm and dry. No rash noted.    ED Course  Procedures (including critical care time) Labs Review Labs Reviewed - No data to display Imaging Review No results found.  EKG Interpretation   None       MDM    Previous ED charts reviewed.    I suspect drug seeking behavior.    Consulted Dr. Gerda Diss.  I was advised that patient had just left his office requesting pain medication and  Patient was given info for pain management and substance counseling to which she declined.    Pt was reviewed on the [place] narcotic database and has several narcotic prescriptions filled since end of Nov  I have counseled the patient, with nursing present, that ED was not proper location for treatment of chronic pain and that she will not be receiving narcotics on this visit.  Pt verbalized understanding.  She is well appearing and stable for discharge.  Cabe Lashley L. Trisha Mangle,  PA-C 01/06/13 1247

## 2013-01-05 NOTE — ED Notes (Signed)
Pt has no s/s of pain observed

## 2013-01-06 NOTE — ED Provider Notes (Signed)
Medical screening examination/treatment/procedure(s) were performed by non-physician practitioner and as supervising physician I was immediately available for consultation/collaboration.  EKG Interpretation   None         Isobella Ascher L Bianka Liberati, MD 01/06/13 1540 

## 2013-05-19 ENCOUNTER — Encounter (HOSPITAL_COMMUNITY): Payer: Self-pay | Admitting: Family Medicine

## 2013-07-18 ENCOUNTER — Encounter (HOSPITAL_COMMUNITY): Payer: Self-pay | Admitting: Emergency Medicine

## 2013-07-18 ENCOUNTER — Emergency Department (HOSPITAL_COMMUNITY)
Admission: EM | Admit: 2013-07-18 | Discharge: 2013-07-18 | Disposition: A | Discharge status: Home / Self Care | Payer: Medicaid Other | Attending: Emergency Medicine | Admitting: Emergency Medicine

## 2013-07-18 DIAGNOSIS — Z880 Allergy status to penicillin: Secondary | ICD-10-CM | POA: Insufficient documentation

## 2013-07-18 DIAGNOSIS — J45901 Unspecified asthma with (acute) exacerbation: Secondary | ICD-10-CM | POA: Insufficient documentation

## 2013-07-18 DIAGNOSIS — Z8659 Personal history of other mental and behavioral disorders: Secondary | ICD-10-CM

## 2013-07-18 DIAGNOSIS — Z79899 Other long term (current) drug therapy: Secondary | ICD-10-CM | POA: Insufficient documentation

## 2013-07-18 DIAGNOSIS — Z862 Personal history of diseases of the blood and blood-forming organs and certain disorders involving the immune mechanism: Secondary | ICD-10-CM | POA: Insufficient documentation

## 2013-07-18 DIAGNOSIS — F410 Panic disorder [episodic paroxysmal anxiety] without agoraphobia: Secondary | ICD-10-CM | POA: Insufficient documentation

## 2013-07-18 DIAGNOSIS — Z8719 Personal history of other diseases of the digestive system: Secondary | ICD-10-CM | POA: Insufficient documentation

## 2013-07-18 DIAGNOSIS — F17200 Nicotine dependence, unspecified, uncomplicated: Secondary | ICD-10-CM | POA: Insufficient documentation

## 2013-07-18 HISTORY — DX: Anxiety disorder, unspecified: F41.9

## 2013-07-18 MED ORDER — ALPRAZOLAM 0.25 MG PO TABS: 0.2500 mg | ORAL_TABLET | Freq: Every evening | ORAL | Status: DC | PRN

## 2013-07-18 NOTE — Discharge Instructions (Signed)
Follow up with your doctor to discuss your panic attacks and medication.

## 2013-07-18 NOTE — ED Notes (Addendum)
Pt c/o panic attacks, states that she has been diagnosed with panic attacks in the past,, was able to stop taking her medication (xanax) for the panic attacks because she was able to deal with the stress that she was having, started noticing two weeks ago that the panic attacks are returning, recently separated from long term relationship,  is unable to see pcp due to owing pcp money,

## 2013-07-18 NOTE — ED Provider Notes (Signed)
CSN: [id number]     Arrival date & time 07/18/13  1325 [id number]   None    Chief Complaint  Patient presents with  . Panic Attack   The history is provided by the patient. No language interpreter was used.   This chart was scribed for nurse practitioner working with Hurman HornJohn M Bednar, MD, by Andrew Auaven Small, ED Scribe. This patient was seen in room [facility]22/AP[facility] and the patient's care was started at 2:50 PM.  [name] is a 28 y.o. female who presents to the Emergency Department complaining of a panic attack. Pt was diagnosed with panic attacks a couple years ago and was taking xanax. Pt states she stopped taking xanax and was able to control her stress. She reports she recently started having panic attacks due to a recent break up. She reports attacks begin with waking from sleep crying followed by her hands and body shakes. Pt is unable to see PCP due to behind payments. Pt states the blue pill medication her doctor prescribed in the past was too strong and often had to break them in half. Pt denies n/v/d, fever, chills, dizziness and HA with attacks. Patient denies having symptoms at this time.   Past Medical History  Diagnosis Date  . Other dysphagia   . Esophageal reflux   . Irritable bowel syndrome   . Hyperemesis gravidarum with metabolic disturbance, unspecified as to episode of care   . Anemia, unspecified   . Unspecified constipation   . Headache(784.0)   . Unspecified asthma(493.90)   . Gastroparesis   . Anxiety state, unspecified   . Other and unspecified noninfectious gastroenteritis and colitis(558.9)   . Nausea alone   . Abdominal pain, unspecified site   . Fibromyalgia   . Irritable bowel syndrome   . Fatty liver   . Pregnant   . Disc herniation     causes sciatica unsure which disc  . 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 Anxiety    Past Surgical History  Procedure  Laterality Date  . Cholecystectomy    . Appendectomy    . Laparoscopic bilateral salpingectomy Bilateral 12/08/2012    Procedure: LAPAROSCOPIC BILATERAL SALPINGECTOMY;  Surgeon: Tilda BurrowJohn V Ferguson, MD;  Location: [facility];  Service: Gynecology;  Laterality: Bilateral;  . Tubal ligation    . Tubal ligation Bilateral 10/31/2012    Procedure: POST PARTUM TUBAL LIGATION;  Surgeon: Reva Boresanya S Pratt, MD;  Location: [facility];  Service: Gynecology;  Laterality: Bilateral;   Family History  Problem Relation Age of Onset  . Ulcerative colitis Paternal Grandmother   . Cancer Paternal Grandmother   . Colon polyps Paternal Grandfather   . Cancer Paternal Grandfather     thyroid  . Diabetes Father   . Heart disease Father   . Kidney disease Father   . Hypertension Father    History  Substance Use Topics  . Smoking status: Current Every Day Smoker -- 0.50 packs/day for 10 years    Types: Cigarettes  . Smokeless tobacco: Never Used  . Alcohol Use: No   OB History   Grav Para Term Preterm Abortions TAB SAB Ect Mult Living   3 3 3       3      Review of Systems  Constitutional: Negative for fever and chills.  Gastrointestinal: Negative for nausea, vomiting, diarrhea and constipation.  Neurological: Negative for dizziness, light-headedness  and headaches.  Psychiatric/Behavioral: The patient is nervous/anxious.   all other systems negative  Allergies  Azithromycin; Hydrocodone; Nsaids; Penicillins; and Prednisone  Home Medications   Prior to Admission medications   Medication Sig Start Date End Date Taking? Authorizing Provider  gabapentin (NEURONTIN) 300 MG capsule Take 1 capsule (300 mg total) by mouth 3 (three) times daily. 09/22/12   Lazaro ArmsLuther H Eure, MD  oxyCODONE-acetaminophen (PERCOCET) 5-325 MG per tablet Take 2 tablets by mouth every 8 (eight) hours as needed for severe pain. 12/07/12   Campbell Richesarolyn C Hoskins, NP  oxyCODONE-acetaminophen (PERCOCET/ROXICET) 5-325 MG per tablet Take 1 tablet by mouth  every 4 (four) hours as needed. 12/08/12   Tilda BurrowJohn Ferguson V, MD   BP 116/75  Pulse 99  Temp(Src) 98.6 F (37 C) (Oral)  Resp 16  SpO2 99%  LMP 07/07/2013 Physical Exam  Nursing note and vitals reviewed. Constitutional: She is oriented to person, place, and time. She appears well-developed and well-nourished. No distress.  HENT:  Head: Normocephalic and atraumatic.  Right Ear: External ear normal.  Left Ear: External ear normal.  Mouth/Throat: Uvula is midline and oropharynx is clear and moist. No posterior oropharyngeal erythema.  TMs clear light reflex present  Eyes: Conjunctivae and EOM are normal. Pupils are equal, round, and reactive to light.  Neck: Normal range of motion. Neck supple.  Cardiovascular: Normal rate, regular rhythm, normal heart sounds and intact distal pulses.  Exam reveals no friction rub.   No murmur heard. Pulmonary/Chest: Effort normal. No respiratory distress. She has wheezes (on inspiration which may be due to her smoking ).  Abdominal: Soft. Bowel sounds are normal. She exhibits no distension and no mass. There is no tenderness. There is no rebound and no guarding.  Musculoskeletal: Normal range of motion.  Neurological: She is alert and oriented to person, place, and time.  Skin: Skin is warm and dry.  Psychiatric: She has a normal mood and affect. Her behavior is normal.    ED Course  Procedures     MDM  28 y.o. female with hx of panic attacks and similar symptoms recently after stress due to significant other problems. I discussed with the patient that we can not do primary care in the ED. I will give her a low dose of xanax but she will need to make an appointment with a primary care doctor for her long term treatment. She voices understanding and agrees to plan. Stable for discharge without symptoms at this time.   Final diagnoses:  History of panic attacks   Meds ordered this encounter  Medications  . ALPRAZolam (XANAX) 0.25 MG tablet    Sig:  Take 1 tablet (0.25 mg total) by mouth at bedtime as needed for anxiety.    Dispense:  14 tablet    Refill:  0    Order Specific Question:  Supervising Provider    Answer:  Hurman HornBEDNAR, JOHN M [[id number]]     I personally performed the services described in this documentation, which was scribed in my presence. The recorded information has been reviewed and is accurate.      Janne NapoleonHope M Neese, [place]NP 07/29/13 2017

## 2013-07-19 NOTE — ED Provider Notes (Signed)
Medical screening examination/treatment/procedure(s) were performed by non-physician practitioner and as supervising physician I was immediately available for consultation/collaboration.   EKG Interpretation None       Tammy HornJohn M Nicholos Aloisi, MD 07/19/13 1234

## 2013-10-29 ENCOUNTER — Other Ambulatory Visit: Payer: Self-pay

## 2013-11-15 ENCOUNTER — Encounter (HOSPITAL_COMMUNITY): Payer: Self-pay | Admitting: Emergency Medicine

## 2014-01-27 ENCOUNTER — Encounter (HOSPITAL_COMMUNITY): Payer: Self-pay | Admitting: Family Medicine

## 2014-02-20 ENCOUNTER — Encounter (HOSPITAL_COMMUNITY): Payer: Self-pay

## 2014-02-20 ENCOUNTER — Emergency Department (HOSPITAL_COMMUNITY)
Admission: EM | Admit: 2014-02-20 | Discharge: 2014-02-20 | Disposition: A | Discharge status: Home / Self Care | Payer: Self-pay | Attending: Emergency Medicine | Admitting: Emergency Medicine

## 2014-02-20 DIAGNOSIS — Z862 Personal history of diseases of the blood and blood-forming organs and certain disorders involving the immune mechanism: Secondary | ICD-10-CM | POA: Insufficient documentation

## 2014-02-20 DIAGNOSIS — Z79899 Other long term (current) drug therapy: Secondary | ICD-10-CM | POA: Insufficient documentation

## 2014-02-20 DIAGNOSIS — F1120 Opioid dependence, uncomplicated: Secondary | ICD-10-CM | POA: Insufficient documentation

## 2014-02-20 DIAGNOSIS — Z8739 Personal history of other diseases of the musculoskeletal system and connective tissue: Secondary | ICD-10-CM | POA: Insufficient documentation

## 2014-02-20 DIAGNOSIS — J45909 Unspecified asthma, uncomplicated: Secondary | ICD-10-CM | POA: Insufficient documentation

## 2014-02-20 DIAGNOSIS — Z8719 Personal history of other diseases of the digestive system: Secondary | ICD-10-CM | POA: Insufficient documentation

## 2014-02-20 DIAGNOSIS — Z720 Tobacco use: Secondary | ICD-10-CM | POA: Insufficient documentation

## 2014-02-20 DIAGNOSIS — Z880 Allergy status to penicillin: Secondary | ICD-10-CM | POA: Insufficient documentation

## 2014-02-20 LAB — BASIC METABOLIC PANEL
Anion gap: 6 (ref 5–15)
BUN: 6 mg/dL (ref 6–23)
CHLORIDE: 109 mmol/L (ref 96–112)
CO2: 24 mmol/L (ref 19–32)
Calcium: 9.3 mg/dL (ref 8.4–10.5)
Creatinine, Ser: 0.5 mg/dL (ref 0.50–1.10)
GFR calc Af Amer: >90 mL/min (ref >90)
GFR calc non Af Amer: >90 mL/min (ref >90)
Glucose, Bld: 98 mg/dL (ref 70–99)
Potassium: 3.6 mmol/L (ref 3.5–5.1)
SODIUM: 139 mmol/L (ref 135–145)

## 2014-02-20 LAB — CBC WITH DIFFERENTIAL/PLATELET
BASOS ABS: 0 10*3/uL (ref 0.0–0.1)
BASOS PCT: 0 % (ref 0–1)
EOS ABS: 0.2 10*3/uL (ref 0.0–0.7)
EOS PCT: 1 % (ref 0–5)
HCT: 42.4 % (ref 36.0–46.0)
Hemoglobin: 14.5 g/dL (ref 12.0–15.0)
Lymphocytes Relative: 20 % (ref 12–46)
Lymphs Abs: 2.7 10*3/uL (ref 0.7–4.0)
MCH: 30.9 pg (ref 26.0–34.0)
MCHC: 34.2 g/dL (ref 30.0–36.0)
MCV: 90.4 fL (ref 78.0–100.0)
MONOS PCT: 5 % (ref 3–12)
Monocytes Absolute: 0.7 10*3/uL (ref 0.1–1.0)
NEUTROS PCT: 74 % (ref 43–77)
Neutro Abs: 10.2 10*3/uL — ABNORMAL HIGH (ref 1.7–7.7)
Platelets: 250 10*3/uL (ref 150–400)
RBC: 4.69 MIL/uL (ref 3.87–5.11)
RDW: 14.4 % (ref 11.5–15.5)
WBC: 13.9 10*3/uL — AB (ref 4.0–10.5)

## 2014-02-20 LAB — ETHANOL

## 2014-02-20 NOTE — Discharge Instructions (Signed)
Follow-up with [facility] as previously instructed.  Return to the emergency department if you experience any further difficulties.   Opioid Withdrawal Opioids are a group of narcotic drugs. They include the street drug heroin. They also include pain medicines, such as morphine, hydrocodone, oxycodone, and fentanyl. Opioid withdrawal is a group of characteristic physical and mental signs and symptoms. It typically occurs if you have been using opioids daily for several weeks or longer and stop using or rapidly decrease use. Opioid withdrawal can also occur if you have used opioids daily for a long time and are given a medicine to block the effect.  SIGNS AND SYMPTOMS Opioid withdrawal includes three or more of the following symptoms:   Depressed, anxious, or irritable mood.  Nausea or vomiting.  Muscle aches or spasms.   Watery eyes.   Runny nose.  Dilated pupils, sweating, or hairs standing on end.  Diarrhea or intestinal cramping.  Yawning.   Fever.  Increased blood pressure.  Fast pulse.  Restlessness or trouble sleeping. These signs and symptoms occur within several hours of stopping or reducing short-acting opioids, such as heroin. They can occur within 3 days of stopping or reducing long-acting opioids, such as methadone. Withdrawal begins within minutes of receiving a drug that blocks the effects of opioids, such as naltrexone or naloxone. DIAGNOSIS  Opioid use disorder is diagnosed by your health care provider. You will be asked about your symptoms, drug and alcohol use, medical history, and use of medicines. A physical exam may be done. Lab tests may be ordered. Your health care provider may have you see a mental health professional.  TREATMENT  The treatment for opioid withdrawal is usually provided by medical doctors with special training in substance use disorders (addiction specialists). The following medicines may be included in treatment:  Opioids given in place of  the abused opioid. They turn on opioid receptors in the brain and lessen or prevent withdrawal symptoms. They are gradually decreased (opioid substitution and taper).  Non-opioids that can lessen certain opioid withdrawal symptoms. They may be used alone or with opioid substitution and taper. Successful long-term recovery usually requires medicine, counseling, and group support. HOME CARE INSTRUCTIONS   Take medicines only as directed by your health care provider.  Check with your health care provider before starting new medicines.  Keep all follow-up visits as directed by your health care provider. SEEK MEDICAL CARE IF:  You are not able to take your medicines as directed.  Your symptoms get worse.  You relapse. SEEK IMMEDIATE MEDICAL CARE IF:  You have serious thoughts about hurting yourself or others.  You have a seizure.  You lose consciousness. Document Released: 01/03/2003 Document Revised: 05/17/2013 Document Reviewed: 01/13/2013 [facility] Patient Information 2015 [employer] [place]. This information is not intended to replace advice given to you by your health care provider. Make sure you discuss any questions you have with your health care provider.

## 2014-02-20 NOTE — ED Notes (Signed)
Pt reports needs medical clearance to go to [facility] for treatment for IV drug abuse.  Reports  She injects pain pills.  Last used yesterday.  Reports injected dilaudid.

## 2014-02-20 NOTE — ED Provider Notes (Signed)
CSN: [id number]     Arrival date & time 02/20/14  [id number] History   First MD Initiated Contact with Patient 02/20/14 1011     This chart was scribed for Geoffery Lyonsouglas Elisabella Hacker, MD by Arlan OrganAshley Leger, ED Scribe. This patient was seen in room APA15/[facility]15 and the patient's care was started 10:26 AM.   Chief Complaint  Patient presents with  . V70.1   HPI  HPI Comments: [name] is a 29 y.o. female who presents to the Emergency Department here for medical clearance today. Pt states she has made plans to attend [facility] for IV drug abuse rehabilitation treatment. Ms. [name] admits to injecting pain medications every day for last 14 months. Last use of dilaudid yesterday. No recent alcohol consumption. Last attempt at treatment and detoxification 3.5 years ago. Pt with several known allergies to medications as listed below.  Past Medical History  Diagnosis Date  . Other dysphagia   . Esophageal reflux   . Irritable bowel syndrome   . Hyperemesis gravidarum with metabolic disturbance, unspecified as to episode of care   . Anemia, unspecified   . Unspecified constipation   . Headache(784.0)   . Unspecified asthma(493.90)   . Gastroparesis   . Anxiety state, unspecified   . Other and unspecified noninfectious gastroenteritis and colitis(558.9)   . Nausea alone   . Abdominal pain, unspecified site   . Fibromyalgia   . Irritable bowel syndrome   . Fatty liver   . Pregnant   . Disc herniation     causes sciatica unsure which disc  . 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 Anxiety    Past Surgical History  Procedure Laterality Date  . Cholecystectomy    . Appendectomy    . Laparoscopic bilateral salpingectomy Bilateral 12/08/2012    Procedure: LAPAROSCOPIC BILATERAL SALPINGECTOMY;  Surgeon: Tilda BurrowJohn V Ferguson, MD;  Location: [facility];  Service: Gynecology;  Laterality: Bilateral;  . Tubal ligation     Family  History  Problem Relation Age of Onset  . Ulcerative colitis Paternal Grandmother   . Cancer Paternal Grandmother   . Colon polyps Paternal Grandfather   . Cancer Paternal Grandfather     thyroid  . Diabetes Father   . Heart disease Father   . Kidney disease Father   . Hypertension Father    History  Substance Use Topics  . Smoking status: Current Every Day Smoker -- 0.50 packs/day for 10 years    Types: Cigarettes  . Smokeless tobacco: Never Used  . Alcohol Use: No   OB History    Gravida Para Term Preterm AB TAB SAB Ectopic Multiple Living   3 3 3       3      Review of Systems  All other systems reviewed and are negative.     Allergies  Azithromycin; Hydrocodone; Nsaids; Penicillins; and Prednisone  Home Medications   Prior to Admission medications   Medication Sig Start Date End Date Taking? Authorizing Provider  ALPRAZolam (XANAX) 0.25 MG tablet Take 1 tablet (0.25 mg total) by mouth at bedtime as needed for anxiety. Patient not taking: Reported on 02/20/2014 07/18/13   Janne NapoleonHope M Neese, NP  ALPRAZolam Prudy Feeler(XANAX) 0.5 MG tablet Take 1 tablet by mouth 2 (two) times daily. 01/10/14   Historical Provider, MD  gabapentin (NEURONTIN) 300 MG capsule Take 1 capsule (300 mg total) by mouth 3 (three) times  daily. 09/22/12   Lazaro Arms, MD  oxyCODONE-acetaminophen (PERCOCET) 5-325 MG per tablet Take 2 tablets by mouth every 8 (eight) hours as needed for severe pain. Patient not taking: Reported on 02/20/2014 12/07/12   Campbell Riches, NP  oxyCODONE-acetaminophen (PERCOCET/ROXICET) 5-325 MG per tablet Take 1 tablet by mouth every 4 (four) hours as needed. Patient not taking: Reported on 02/20/2014 12/08/12   Tilda Burrow, MD   Triage Vitals: BP 112/80 mmHg  Pulse 87  Temp(Src) 98.3 F (36.8 C) (Oral)  Resp 18  Ht  (1.702 m)  Wt 148 lb (67.132 kg)  BMI 23.17 kg/m2  SpO2 100%  LMP 02/07/2014  Breastfeeding? No   Physical Exam  Constitutional: She is oriented to person,  place, and time. She appears well-developed and well-nourished.  HENT:  Head: Normocephalic.  Eyes: EOM are normal.  Neck: Normal range of motion.  Pulmonary/Chest: Effort normal.  Abdominal: She exhibits no distension.  Musculoskeletal: Normal range of motion.  Neurological: She is alert and oriented to person, place, and time.  Psychiatric: She has a normal mood and affect.  Nursing note and vitals reviewed.   ED Course  Procedures (including critical care time)  DIAGNOSTIC STUDIES: Oxygen Saturation is 100% on RA, Normal by my interpretation.    COORDINATION OF CARE: 10:28 AM- Will order urinalysis, pregnancy urine, drug screen, ethanol, CBC, and BMP. Discussed treatment plan with pt at bedside and pt agreed to plan.     Labs Review Labs Reviewed  CBC WITH DIFFERENTIAL/PLATELET  BASIC METABOLIC PANEL  URINALYSIS, ROUTINE W REFLEX MICROSCOPIC  PREGNANCY, URINE  URINE RAPID DRUG SCREEN (HOSP PERFORMED)  ETHANOL    Imaging Review No results found.   EKG Interpretation None      MDM   Final diagnoses:  None    Patient is a 29 year old female who presents for medical clearance. She has been in touch with [facility] regarding treatment for her pain pill addiction. She was told to come here for medical clearance. Her vital signs are stable and physical examination are unremarkable. She does not appear to be acutely withdrawing and I believe is appropriate for this treatment. We have spoken with [facility] and she will be directed there by private auto. Her father will take her there.  I personally performed the services described in this documentation, which was scribed in my presence. The recorded information has been reviewed and is accurate.      Geoffery Lyons, MD 02/20/14 1452

## 2014-05-29 ENCOUNTER — Encounter (HOSPITAL_COMMUNITY): Payer: Self-pay | Admitting: Emergency Medicine

## 2014-05-29 ENCOUNTER — Emergency Department (HOSPITAL_COMMUNITY)
Admission: EM | Admit: 2014-05-29 | Discharge: 2014-05-29 | Disposition: A | Discharge status: Home / Self Care | Payer: Self-pay | Attending: Emergency Medicine | Admitting: Emergency Medicine

## 2014-05-29 DIAGNOSIS — Z862 Personal history of diseases of the blood and blood-forming organs and certain disorders involving the immune mechanism: Secondary | ICD-10-CM | POA: Insufficient documentation

## 2014-05-29 DIAGNOSIS — Z79899 Other long term (current) drug therapy: Secondary | ICD-10-CM | POA: Insufficient documentation

## 2014-05-29 DIAGNOSIS — Z720 Tobacco use: Secondary | ICD-10-CM | POA: Insufficient documentation

## 2014-05-29 DIAGNOSIS — Z8719 Personal history of other diseases of the digestive system: Secondary | ICD-10-CM | POA: Insufficient documentation

## 2014-05-29 DIAGNOSIS — F410 Panic disorder [episodic paroxysmal anxiety] without agoraphobia: Secondary | ICD-10-CM | POA: Insufficient documentation

## 2014-05-29 DIAGNOSIS — Z880 Allergy status to penicillin: Secondary | ICD-10-CM | POA: Insufficient documentation

## 2014-05-29 DIAGNOSIS — J45909 Unspecified asthma, uncomplicated: Secondary | ICD-10-CM | POA: Insufficient documentation

## 2014-05-29 MED ORDER — ALPRAZOLAM 0.5 MG PO TABS
1.0000 mg | ORAL_TABLET | Freq: Once | ORAL | Status: AC
  Administered 2014-05-29: 1 mg via ORAL
  Filled 2014-05-29: qty 2

## 2014-05-29 MED ORDER — ALPRAZOLAM 0.5 MG PO TABS: 0.5000 mg | ORAL_TABLET | Freq: Three times a day (TID) | ORAL | Status: DC | PRN

## 2014-05-29 NOTE — ED Provider Notes (Signed)
CSN: [id number]     Arrival date & time 05/29/14  1557 History   First MD Initiated Contact with Patient 05/29/14 1707     Chief Complaint  Patient presents with  . Panic Attack     (Consider location/radiation/quality/duration/timing/severity/associated sxs/prior Treatment) HPI.... Patient complains of panic attack for 3 days. This correlated with her running out of Xanax. Unable to see her primary care doctor. She now is anxious. No chest pain or dyspnea. Severity is mild to moderate.  Past Medical History  Diagnosis Date  . Other dysphagia   . Esophageal reflux   . Irritable bowel syndrome   . Hyperemesis gravidarum with metabolic disturbance, unspecified as to episode of care   . Anemia, unspecified   . Unspecified constipation   . Headache(784.0)   . Unspecified asthma(493.90)   . Gastroparesis   . Anxiety state, unspecified   . Other and unspecified noninfectious gastroenteritis and colitis(558.9)   . Nausea alone   . Abdominal pain, unspecified site   . Fibromyalgia   . Irritable bowel syndrome   . Fatty liver   . Pregnant   . Disc herniation     causes sciatica unsure which disc  . 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 Anxiety    Past Surgical History  Procedure Laterality Date  . Cholecystectomy    . Appendectomy    . Laparoscopic bilateral salpingectomy Bilateral 12/08/2012    Procedure: LAPAROSCOPIC BILATERAL SALPINGECTOMY;  Surgeon: Tilda BurrowJohn V Ferguson, MD;  Location: [facility];  Service: Gynecology;  Laterality: Bilateral;  . Tubal ligation     Family History  Problem Relation Age of Onset  . Ulcerative colitis Paternal Grandmother   . Cancer Paternal Grandmother   . Colon polyps Paternal Grandfather   . Cancer Paternal Grandfather     thyroid  . Diabetes Father   . Heart disease Father   . Kidney disease Father   . Hypertension Father    History  Substance  Use Topics  . Smoking status: Current Every Day Smoker -- 1.00 packs/day for 10 years    Types: Cigarettes  . Smokeless tobacco: Never Used  . Alcohol Use: No   OB History    Gravida Para Term Preterm AB TAB SAB Ectopic Multiple Living   3 3 3       3      Review of Systems  All other systems reviewed and are negative.     Allergies  Azithromycin; Hydrocodone; Nsaids; Penicillins; and Prednisone  Home Medications   Prior to Admission medications   Medication Sig Start Date End Date Taking? Authorizing Provider  acetaminophen (TYLENOL) 500 MG tablet Take 1,000 mg by mouth every 6 (six) hours as needed for moderate pain.   Yes Historical Provider, MD  citalopram (CELEXA) 40 MG tablet Take 40 mg by mouth daily. 05/02/14  Yes Historical Provider, MD  ALPRAZolam Prudy Feeler(XANAX) 0.5 MG tablet Take 1 tablet (0.5 mg total) by mouth 3 (three) times daily as needed for anxiety. 05/29/14   Donnetta HutchingBrian Vikash Nest, MD  gabapentin (NEURONTIN) 300 MG capsule Take 1 capsule (300 mg total) by mouth 3 (three) times daily. Patient not taking: Reported on 02/20/2014 09/22/12   Lazaro ArmsLuther H Eure, MD  oxyCODONE-acetaminophen (PERCOCET) 5-325 MG per tablet Take 2 tablets by mouth every 8 (eight) hours as needed for severe pain. Patient not taking: Reported on 02/20/2014 12/07/12   Presley Raddlearolyn C  Hoskins, NP  oxyCODONE-acetaminophen (PERCOCET/ROXICET) 5-325 MG per tablet Take 1 tablet by mouth every 4 (four) hours as needed. Patient not taking: Reported on 02/20/2014 12/08/12   Tilda BurrowJohn Ferguson V, MD   BP 97/59 mmHg  Pulse 64  Temp(Src) 97.9 F (36.6 C) (Oral)  Resp 18  Ht 5\' 7"  (1.702 m)  Wt 150 lb (68.04 kg)  BMI 23.49 kg/m2  SpO2 98%  LMP 05/24/2014 Physical Exam  Constitutional: She is oriented to person, place, and time. She appears well-developed and well-nourished.  Fidgety  HENT:  Head: Normocephalic and atraumatic.  Eyes: Conjunctivae and EOM are normal. Pupils are equal, round, and reactive to light.  Neck: Normal range of  motion. Neck supple.  Cardiovascular: Normal rate and regular rhythm.   Pulmonary/Chest: Effort normal and breath sounds normal.  Abdominal: Soft. Bowel sounds are normal.  Musculoskeletal: Normal range of motion.  Neurological: She is alert and oriented to person, place, and time.  Skin: Skin is warm and dry.  Psychiatric: She has a normal mood and affect. Her behavior is normal.  Nursing note and vitals reviewed.   ED Course  Procedures (including critical care time) Labs Review Labs Reviewed - No data to display  Imaging Review No results found.   EKG Interpretation None      MDM   Final diagnoses:  Panic attack    Patient is in no acute distress. Will refiill xanax 0.5mg  [#21] to avoid seizure.  Patient instructed to get primary care follow-up.    Donnetta HutchingBrian Jamoni Broadfoot, MD 05/29/14 [phone]

## 2014-05-29 NOTE — Discharge Instructions (Signed)
Refill of your medicine for 1 week. We will not refill it here anymore. [facility]

## 2014-05-29 NOTE — ED Notes (Signed)
PT states increased in anxiety and having panic attacks x3 days after running out of her prescription of xanax. PT stated she couldn't see her MD d/t outstanding bills due so unable to get a prescription refill. PT calm in triage at this time.

## 2014-06-23 ENCOUNTER — Encounter (HOSPITAL_COMMUNITY): Payer: Self-pay | Admitting: Family Medicine

## 2014-12-19 ENCOUNTER — Emergency Department (HOSPITAL_COMMUNITY): Payer: Medicaid Other

## 2014-12-19 ENCOUNTER — Encounter (HOSPITAL_COMMUNITY): Payer: Self-pay | Admitting: Emergency Medicine

## 2014-12-19 ENCOUNTER — Emergency Department (HOSPITAL_COMMUNITY)
Admission: EM | Admit: 2014-12-19 | Discharge: 2014-12-19 | Disposition: A | Discharge status: Home / Self Care | Payer: Medicaid Other | Attending: Emergency Medicine | Admitting: Emergency Medicine

## 2014-12-19 DIAGNOSIS — Z8739 Personal history of other diseases of the musculoskeletal system and connective tissue: Secondary | ICD-10-CM | POA: Diagnosis not present

## 2014-12-19 DIAGNOSIS — S5011XA Contusion of right forearm, initial encounter: Secondary | ICD-10-CM | POA: Insufficient documentation

## 2014-12-19 DIAGNOSIS — Z862 Personal history of diseases of the blood and blood-forming organs and certain disorders involving the immune mechanism: Secondary | ICD-10-CM | POA: Diagnosis not present

## 2014-12-19 DIAGNOSIS — J45909 Unspecified asthma, uncomplicated: Secondary | ICD-10-CM | POA: Insufficient documentation

## 2014-12-19 DIAGNOSIS — Z9049 Acquired absence of other specified parts of digestive tract: Secondary | ICD-10-CM | POA: Diagnosis not present

## 2014-12-19 DIAGNOSIS — Y998 Other external cause status: Secondary | ICD-10-CM | POA: Diagnosis not present

## 2014-12-19 DIAGNOSIS — F17210 Nicotine dependence, cigarettes, uncomplicated: Secondary | ICD-10-CM | POA: Insufficient documentation

## 2014-12-19 DIAGNOSIS — T07XXXA Unspecified multiple injuries, initial encounter: Secondary | ICD-10-CM

## 2014-12-19 DIAGNOSIS — Z3202 Encounter for pregnancy test, result negative: Secondary | ICD-10-CM | POA: Insufficient documentation

## 2014-12-19 DIAGNOSIS — Y9389 Activity, other specified: Secondary | ICD-10-CM | POA: Insufficient documentation

## 2014-12-19 DIAGNOSIS — S301XXA Contusion of abdominal wall, initial encounter: Secondary | ICD-10-CM | POA: Insufficient documentation

## 2014-12-19 DIAGNOSIS — Z8719 Personal history of other diseases of the digestive system: Secondary | ICD-10-CM | POA: Insufficient documentation

## 2014-12-19 DIAGNOSIS — Z79899 Other long term (current) drug therapy: Secondary | ICD-10-CM | POA: Insufficient documentation

## 2014-12-19 DIAGNOSIS — Z880 Allergy status to penicillin: Secondary | ICD-10-CM | POA: Diagnosis not present

## 2014-12-19 DIAGNOSIS — S59911A Unspecified injury of right forearm, initial encounter: Secondary | ICD-10-CM | POA: Diagnosis present

## 2014-12-19 DIAGNOSIS — F419 Anxiety disorder, unspecified: Secondary | ICD-10-CM | POA: Diagnosis not present

## 2014-12-19 DIAGNOSIS — Y92410 Unspecified street and highway as the place of occurrence of the external cause: Secondary | ICD-10-CM | POA: Diagnosis not present

## 2014-12-19 LAB — PREGNANCY, URINE: Preg Test, Ur: NEGATIVE

## 2014-12-19 MED ORDER — TRAMADOL HCL 50 MG PO TABS: 50.0000 mg | ORAL_TABLET | Freq: Four times a day (QID) | ORAL | Status: DC | PRN

## 2014-12-19 NOTE — Discharge Instructions (Signed)
Ice as needed to sore areas.  Tramadol as needed for pain.
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
## 2014-12-19 NOTE — ED Notes (Signed)
Discharge instructions given to pt - verbalized understanding, Discussed use of Pain med script and risk of sleepiness . Also discussed non pharmacutical pain relief  Methods.

## 2015-10-28 ENCOUNTER — Emergency Department (HOSPITAL_COMMUNITY)
Admission: EM | Admit: 2015-10-28 | Discharge: 2015-10-28 | Disposition: A | Discharge status: Home / Self Care | Payer: Medicaid Other | Attending: Emergency Medicine | Admitting: Emergency Medicine

## 2015-10-28 ENCOUNTER — Encounter (HOSPITAL_COMMUNITY): Payer: Self-pay | Admitting: *Deleted

## 2015-10-28 DIAGNOSIS — K047 Periapical abscess without sinus: Secondary | ICD-10-CM | POA: Insufficient documentation

## 2015-10-28 DIAGNOSIS — J45909 Unspecified asthma, uncomplicated: Secondary | ICD-10-CM | POA: Diagnosis not present

## 2015-10-28 DIAGNOSIS — Z79899 Other long term (current) drug therapy: Secondary | ICD-10-CM | POA: Diagnosis not present

## 2015-10-28 DIAGNOSIS — F17210 Nicotine dependence, cigarettes, uncomplicated: Secondary | ICD-10-CM | POA: Diagnosis not present

## 2015-10-28 DIAGNOSIS — K0889 Other specified disorders of teeth and supporting structures: Secondary | ICD-10-CM | POA: Diagnosis present

## 2015-10-28 MED ORDER — CLINDAMYCIN HCL 300 MG PO CAPS: 300.0000 mg | ORAL_CAPSULE | Freq: Four times a day (QID) | ORAL | 0 refills | Status: DC

## 2015-10-28 MED ORDER — TRAMADOL HCL 50 MG PO TABS: 50.0000 mg | ORAL_TABLET | Freq: Four times a day (QID) | ORAL | 0 refills | Status: DC | PRN

## 2015-10-28 NOTE — Progress Notes (Signed)
Telephone Encounter: Pharmacist called to verify that prescriber is aware that this pt is on Suboxone and should not be receiving any pain medications from other providers. CM unable to identify that pt had given information to Dr Estell HarpinZammit. Pharmacist reports policy is to NOT Fill new meds (Ultram) and have the pt use Advil or Motrin. No further CM needs at this time.

## 2015-10-28 NOTE — ED Triage Notes (Signed)
Dental pain  Right upper jaw for over a week

## 2015-10-28 NOTE — Discharge Instructions (Signed)
Ee your dentist this week for recheck

## 2015-10-29 NOTE — ED Provider Notes (Signed)
[facility] Provider Note   CSN: [id number] Arrival date & time: 10/28/15  1408     History   Chief Complaint Chief Complaint  Patient presents with  . Dental Pain    HPI [name] is a 30 y.o. female.  The history is provided by the patient. No language interpreter was used.  Dental Pain   This is a new problem. The current episode started more than 2 days ago. The problem occurs constantly. The problem has been gradually worsening. The pain is moderate. She has tried nothing for the symptoms. The treatment provided no relief.  Pt complains of facial pain and a toothache.    Past Medical History:  Diagnosis Date  . Abdominal pain, unspecified site   . Anemia, unspecified   . Anxiety   . Anxiety state, unspecified   . Disc herniation    causes sciatica unsure which disc  . Esophageal reflux   . Fatty liver   . Fibromyalgia   . Gastroparesis   . Headache(784.0)   . History of narcotic addiction (HCC) 09/30/2012   2010:  Per pt, was addicted to narcotics; mom helped intervene and stopped taking opiods   . Hyperemesis gravidarum with metabolic disturbance, unspecified as to episode of care   . Irritable bowel syndrome   . Irritable bowel syndrome   . Nausea alone   . Other and unspecified noninfectious gastroenteritis and colitis(558.9)   . Other dysphagia   . Pregnant   . Unspecified asthma(493.90)   . Unspecified constipation     Patient Active Problem List   Diagnosis Date Noted  . Sterilization consult 12/03/2012  . Fibromyalgia 03/19/2012  . GERD 02/02/2009  . IRRITABLE BOWEL SYNDROME 02/02/2009  . COLITIS 04/29/2008  . ANXIETY 04/28/2008  . ASTHMA 04/28/2008  . GASTROPARESIS 04/28/2008    Past Surgical History:  Procedure Laterality Date  . APPENDECTOMY    . CHOLECYSTECTOMY    . LAPAROSCOPIC BILATERAL SALPINGECTOMY Bilateral 12/08/2012   Procedure: LAPAROSCOPIC BILATERAL SALPINGECTOMY;  Surgeon: Tilda Burrow, MD;  Location: [facility];   Service: Gynecology;  Laterality: Bilateral;  . TUBAL LIGATION      OB History    Gravida Para Term Preterm AB Living   3 3 3     3    SAB TAB Ectopic Multiple Live Births           3       Home Medications    Prior to Admission medications   Medication Sig Start Date End Date Taking? Authorizing Provider  acetaminophen (TYLENOL) 500 MG tablet Take 2,000 mg by mouth every 6 (six) hours as needed for moderate pain.     Historical Provider, MD  ALPRAZolam Prudy Feeler) 0.5 MG tablet Take 1 tablet (0.5 mg total) by mouth 3 (three) times daily as needed for anxiety. Patient not taking: Reported on 12/19/2014 05/29/14   Donnetta Hutching, MD  clindamycin (CLEOCIN) 300 MG capsule Take 1 capsule (300 mg total) by mouth every 6 (six) hours. 10/28/15   Elson Areas, PA-C  Multiple Vitamin (MULTIVITAMIN WITH MINERALS) TABS tablet Take 1 tablet by mouth daily.    Historical Provider, MD  traMADol (ULTRAM) 50 MG tablet Take 1 tablet (50 mg total) by mouth every 6 (six) hours as needed. 10/28/15   Elson Areas, PA-C    Family History Family History  Problem Relation Age of Onset  . Ulcerative colitis Paternal Grandmother   . Cancer Paternal Grandmother   . Colon polyps Paternal  Grandfather   . Cancer Paternal Grandfather     thyroid  . Diabetes Father   . Heart disease Father   . Kidney disease Father   . Hypertension Father     Social History Social History  Substance Use Topics  . Smoking status: Current Every Day Smoker    Packs/day: 1.00    Years: 10.00    Types: Cigarettes  . Smokeless tobacco: Never Used  . Alcohol use No     Allergies   Azithromycin; Hydrocodone; Nsaids; Penicillins; and Prednisone   Review of Systems Review of Systems  All other systems reviewed and are negative.    Physical Exam Updated Vital Signs BP 126/78 (BP Location: Right Arm)   Pulse 66   Temp 98.2 F (36.8 C) (Oral)   Resp 18   Ht 5\' 7"  (1.702 m)   Wt 70.3 kg   LMP 10/21/2015   SpO2 97%    BMI 24.28 kg/m   Physical Exam  Constitutional: She appears well-developed and well-nourished. No distress.  HENT:  Head: Normocephalic and atraumatic.  Broken decayed teeth  Eyes: Conjunctivae are normal.  Neck: Neck supple.  Cardiovascular: Normal rate and regular rhythm.   No murmur heard. Pulmonary/Chest: Effort normal and breath sounds normal. No respiratory distress.  Abdominal: Soft. There is no tenderness.  Musculoskeletal: She exhibits no edema.  Neurological: She is alert.  Skin: Skin is warm and dry.  Psychiatric: She has a normal mood and affect.  Nursing note and vitals reviewed.    ED Treatments / Results  Labs (all labs ordered are listed, but only abnormal results are displayed) Labs Reviewed - No data to display  EKG  EKG Interpretation None       Radiology No results found.  Procedures Procedures (including critical care time)  Medications Ordered in ED Medications - No data to display   Initial Impression / Assessment and Plan / ED Course  I have reviewed the triage vital signs and the nursing notes.  Pertinent labs & imaging results that were available during my care of the patient were reviewed by me and considered in my medical decision making (see chart for details).  Clinical Course    Pt advised to see her dentist as soon as possible  Final Clinical Impressions(s) / ED Diagnoses   Final diagnoses:  Dental abscess    New Prescriptions Discharge Medication List as of 10/28/2015  3:11 PM    START taking these medications   Details  clindamycin (CLEOCIN) 300 MG capsule Take 1 capsule (300 mg total) by mouth every 6 (six) hours., Starting Sat 10/28/2015, Print      An After Visit Summary was printed and given to the patient.   Lonia SkinnerLeslie K Corn CreekSofia, PA-C 10/29/15 [id number]    Bethann BerkshireJoseph Zammit, MD 11/01/15 [phone]

## 2015-11-09 ENCOUNTER — Ambulatory Visit: Payer: Medicaid Other | Admitting: Physician Assistant

## 2015-11-30 ENCOUNTER — Encounter (HOSPITAL_COMMUNITY): Payer: Self-pay | Admitting: *Deleted

## 2015-11-30 NOTE — Progress Notes (Signed)
Pt denies SOB, chest pain, and being under the care of a cardiologist. Pt denies having a stress test, echo and cardiac cath. Pt denies having an EKG and chest x ray within the last year. Pt denies having any recent labs. Pt made aware to stop taking Stop taking Aspirin, vitamins, fish oil and herbal medications. Do not take any NSAIDs ie: Ibuprofen, Advil, Naproxen, BC and Goody Powder or any medication containing Aspirin. Pt verbalized understanding of all [pre-op instructions.

## 2015-12-01 ENCOUNTER — Ambulatory Visit (HOSPITAL_COMMUNITY): Payer: Medicaid Other | Admitting: Certified Registered Nurse Anesthetist

## 2015-12-01 ENCOUNTER — Encounter (HOSPITAL_COMMUNITY): Payer: Self-pay | Admitting: Certified Registered Nurse Anesthetist

## 2015-12-01 ENCOUNTER — Encounter (HOSPITAL_COMMUNITY)
Admission: RE | Disposition: A | Discharge status: Home / Self Care | Payer: Self-pay | Source: Ambulatory Visit | Attending: Oral Surgery

## 2015-12-01 ENCOUNTER — Ambulatory Visit (HOSPITAL_COMMUNITY)
Admission: RE | Admit: 2015-12-01 | Discharge: 2015-12-01 | Disposition: A | Discharge status: Home / Self Care | Payer: Medicaid Other | Source: Ambulatory Visit | Attending: Oral Surgery | Admitting: Oral Surgery

## 2015-12-01 DIAGNOSIS — R011 Cardiac murmur, unspecified: Secondary | ICD-10-CM | POA: Insufficient documentation

## 2015-12-01 DIAGNOSIS — K760 Fatty (change of) liver, not elsewhere classified: Secondary | ICD-10-CM | POA: Diagnosis not present

## 2015-12-01 DIAGNOSIS — K3184 Gastroparesis: Secondary | ICD-10-CM | POA: Diagnosis not present

## 2015-12-01 DIAGNOSIS — Z881 Allergy status to other antibiotic agents status: Secondary | ICD-10-CM | POA: Insufficient documentation

## 2015-12-01 DIAGNOSIS — F1910 Other psychoactive substance abuse, uncomplicated: Secondary | ICD-10-CM | POA: Insufficient documentation

## 2015-12-01 DIAGNOSIS — K219 Gastro-esophageal reflux disease without esophagitis: Secondary | ICD-10-CM | POA: Diagnosis not present

## 2015-12-01 DIAGNOSIS — Z886 Allergy status to analgesic agent status: Secondary | ICD-10-CM | POA: Diagnosis not present

## 2015-12-01 DIAGNOSIS — Z885 Allergy status to narcotic agent status: Secondary | ICD-10-CM | POA: Diagnosis not present

## 2015-12-01 DIAGNOSIS — M797 Fibromyalgia: Secondary | ICD-10-CM | POA: Diagnosis not present

## 2015-12-01 DIAGNOSIS — K029 Dental caries, unspecified: Secondary | ICD-10-CM | POA: Diagnosis present

## 2015-12-01 DIAGNOSIS — Z888 Allergy status to other drugs, medicaments and biological substances status: Secondary | ICD-10-CM | POA: Insufficient documentation

## 2015-12-01 DIAGNOSIS — F419 Anxiety disorder, unspecified: Secondary | ICD-10-CM | POA: Insufficient documentation

## 2015-12-01 DIAGNOSIS — J45909 Unspecified asthma, uncomplicated: Secondary | ICD-10-CM | POA: Insufficient documentation

## 2015-12-01 DIAGNOSIS — F4310 Post-traumatic stress disorder, unspecified: Secondary | ICD-10-CM | POA: Diagnosis not present

## 2015-12-01 DIAGNOSIS — Z880 Allergy status to penicillin: Secondary | ICD-10-CM | POA: Insufficient documentation

## 2015-12-01 DIAGNOSIS — K589 Irritable bowel syndrome without diarrhea: Secondary | ICD-10-CM | POA: Insufficient documentation

## 2015-12-01 HISTORY — DX: Post-traumatic stress disorder, unspecified: F43.10

## 2015-12-01 HISTORY — DX: Cardiac murmur, unspecified: R01.1

## 2015-12-01 HISTORY — DX: Unspecified asthma, uncomplicated: J45.909

## 2015-12-01 HISTORY — PX: MULTIPLE EXTRACTIONS WITH ALVEOLOPLASTY: SHX5342

## 2015-12-01 LAB — CBC
HCT: 40.1 % (ref 36.0–46.0)
Hemoglobin: 13.7 g/dL (ref 12.0–15.0)
MCH: 31.1 pg (ref 26.0–34.0)
MCHC: 34.2 g/dL (ref 30.0–36.0)
MCV: 91.1 fL (ref 78.0–100.0)
PLATELETS: 262 10*3/uL (ref 150–400)
RBC: 4.4 MIL/uL (ref 3.87–5.11)
RDW: 13.4 % (ref 11.5–15.5)
WBC: 7.1 10*3/uL (ref 4.0–10.5)

## 2015-12-01 LAB — HCG, SERUM, QUALITATIVE: Preg, Serum: NEGATIVE

## 2015-12-01 SURGERY — MULTIPLE EXTRACTION WITH ALVEOLOPLASTY
Anesthesia: General | Site: Mouth

## 2015-12-01 MED ORDER — PROPOFOL 10 MG/ML IV BOLUS
INTRAVENOUS | Status: DC | PRN
  Administered 2015-12-01 (×2): 200 mg via INTRAVENOUS

## 2015-12-01 MED ORDER — FENTANYL CITRATE (PF) 100 MCG/2ML IJ SOLN
INTRAMUSCULAR | Status: AC
  Filled 2015-12-01: qty 2

## 2015-12-01 MED ORDER — MIDAZOLAM HCL 2 MG/2ML IJ SOLN
INTRAMUSCULAR | Status: AC
  Filled 2015-12-01: qty 2

## 2015-12-01 MED ORDER — FENTANYL CITRATE (PF) 100 MCG/2ML IJ SOLN
INTRAMUSCULAR | Status: AC
  Filled 2015-12-01: qty 4

## 2015-12-01 MED ORDER — PHENYLEPHRINE HCL 10 MG/ML IJ SOLN
INTRAMUSCULAR | Status: DC | PRN
  Administered 2015-12-01: 80 ug via INTRAVENOUS

## 2015-12-01 MED ORDER — OXYMETAZOLINE HCL 0.05 % NA SOLN
NASAL | Status: DC | PRN
Start: 2015-12-01 — End: 2015-12-01
  Administered 2015-12-01: 1

## 2015-12-01 MED ORDER — LIDOCAINE-EPINEPHRINE 2 %-1:100000 IJ SOLN
INTRAMUSCULAR | Status: DC | PRN
  Administered 2015-12-01: 17 mL via INTRADERMAL

## 2015-12-01 MED ORDER — CEFAZOLIN SODIUM 1 G IJ SOLR
INTRAMUSCULAR | Status: DC | PRN
  Administered 2015-12-01: 1 g via INTRAMUSCULAR

## 2015-12-01 MED ORDER — LIDOCAINE 2% (20 MG/ML) 5 ML SYRINGE
INTRAMUSCULAR | Status: AC
  Filled 2015-12-01: qty 5

## 2015-12-01 MED ORDER — METOCLOPRAMIDE HCL 5 MG/ML IJ SOLN: 10.0000 mg | Freq: Once | INTRAMUSCULAR | Status: DC | PRN

## 2015-12-01 MED ORDER — FENTANYL CITRATE (PF) 100 MCG/2ML IJ SOLN: 25.0000 ug | INTRAMUSCULAR | Status: DC | PRN

## 2015-12-01 MED ORDER — MEPERIDINE HCL 25 MG/ML IJ SOLN: 6.2500 mg | INTRAMUSCULAR | Status: DC | PRN

## 2015-12-01 MED ORDER — ONDANSETRON HCL 4 MG/2ML IJ SOLN
INTRAMUSCULAR | Status: AC
  Filled 2015-12-01: qty 2

## 2015-12-01 MED ORDER — ROCURONIUM BROMIDE 100 MG/10ML IV SOLN
INTRAVENOUS | Status: DC | PRN
  Administered 2015-12-01: 50 mg via INTRAVENOUS

## 2015-12-01 MED ORDER — MIDAZOLAM HCL 5 MG/5ML IJ SOLN
INTRAMUSCULAR | Status: DC | PRN
  Administered 2015-12-01 (×2): 2 mg via INTRAVENOUS

## 2015-12-01 MED ORDER — LACTATED RINGERS IV SOLN
INTRAVENOUS | Status: DC | PRN
  Administered 2015-12-01: 07:00:00 via INTRAVENOUS

## 2015-12-01 MED ORDER — PROPOFOL 10 MG/ML IV BOLUS
INTRAVENOUS | Status: AC
  Filled 2015-12-01: qty 20

## 2015-12-01 MED ORDER — ONDANSETRON HCL 4 MG/2ML IJ SOLN
INTRAMUSCULAR | Status: DC | PRN
  Administered 2015-12-01: 4 mg via INTRAVENOUS

## 2015-12-01 MED ORDER — OXYMETAZOLINE HCL 0.05 % NA SOLN
NASAL | Status: DC | PRN
  Administered 2015-12-01: 2 via NASAL

## 2015-12-01 MED ORDER — LIDOCAINE-EPINEPHRINE 2 %-1:100000 IJ SOLN
INTRAMUSCULAR | Status: AC
  Filled 2015-12-01: qty 1

## 2015-12-01 MED ORDER — OXYCODONE-ACETAMINOPHEN 10-325 MG PO TABS: 1.0000 | ORAL_TABLET | ORAL | 0 refills | Status: DC | PRN

## 2015-12-01 MED ORDER — SUGAMMADEX SODIUM 200 MG/2ML IV SOLN
INTRAVENOUS | Status: DC | PRN
  Administered 2015-12-01: 150 mg via INTRAVENOUS

## 2015-12-01 MED ORDER — DEXAMETHASONE SODIUM PHOSPHATE 10 MG/ML IJ SOLN
INTRAMUSCULAR | Status: AC
  Filled 2015-12-01: qty 1

## 2015-12-01 MED ORDER — LACTATED RINGERS IV SOLN: INTRAVENOUS | Status: DC

## 2015-12-01 MED ORDER — 0.9 % SODIUM CHLORIDE (POUR BTL) OPTIME
TOPICAL | Status: DC | PRN
  Administered 2015-12-01: 1000 mL

## 2015-12-01 MED ORDER — DEXAMETHASONE SODIUM PHOSPHATE 10 MG/ML IJ SOLN
INTRAMUSCULAR | Status: DC | PRN
  Administered 2015-12-01: 10 mg via INTRAVENOUS

## 2015-12-01 MED ORDER — LIDOCAINE HCL (CARDIAC) 20 MG/ML IV SOLN
INTRAVENOUS | Status: DC | PRN
  Administered 2015-12-01: 60 mg via INTRAVENOUS

## 2015-12-01 MED ORDER — ROCURONIUM BROMIDE 10 MG/ML (PF) SYRINGE
PREFILLED_SYRINGE | INTRAVENOUS | Status: AC
  Filled 2015-12-01: qty 10

## 2015-12-01 MED ORDER — SODIUM CHLORIDE 0.9 % IR SOLN
Status: DC | PRN
  Administered 2015-12-01: 1000 mL

## 2015-12-01 MED ORDER — FENTANYL CITRATE (PF) 100 MCG/2ML IJ SOLN
INTRAMUSCULAR | Status: DC | PRN
  Administered 2015-12-01 (×3): 50 ug via INTRAVENOUS
  Administered 2015-12-01: 100 ug via INTRAVENOUS

## 2015-12-01 SURGICAL SUPPLY — 32 items
BUR CROSS CUT FISSURE 1.6 (BURR) ×2 IMPLANT
BUR CROSS CUT FISSURE 1.6MM (BURR) ×1
BUR EGG ELITE 4.0 (BURR) ×1 IMPLANT
BUR EGG ELITE 4.0MM (BURR) ×1
CANISTER SUCTION 2500CC (MISCELLANEOUS) ×3 IMPLANT
COVER SURGICAL LIGHT HANDLE (MISCELLANEOUS) ×3 IMPLANT
CRADLE DONUT ADULT HEAD (MISCELLANEOUS) ×3 IMPLANT
DECANTER SPIKE VIAL GLASS SM (MISCELLANEOUS) ×2 IMPLANT
DRAPE U-SHAPE 76X120 STRL (DRAPES) IMPLANT
FLUID NSS /IRRIG 1000 ML XXX (MISCELLANEOUS) ×3 IMPLANT
GAUZE PACKING FOLDED 2  STR (GAUZE/BANDAGES/DRESSINGS) ×2
GAUZE PACKING FOLDED 2 STR (GAUZE/BANDAGES/DRESSINGS) ×1 IMPLANT
GLOVE BIO SURGEON STRL SZ 6.5 (GLOVE) ×2 IMPLANT
GLOVE BIO SURGEON STRL SZ7.5 (GLOVE) ×3 IMPLANT
GLOVE BIO SURGEONS STRL SZ 6.5 (GLOVE) ×1
GLOVE BIOGEL PI IND STRL 7.0 (GLOVE) ×1 IMPLANT
GLOVE BIOGEL PI INDICATOR 7.0 (GLOVE) ×2
GOWN STRL REUS W/ TWL LRG LVL3 (GOWN DISPOSABLE) ×1 IMPLANT
GOWN STRL REUS W/ TWL XL LVL3 (GOWN DISPOSABLE) ×1 IMPLANT
GOWN STRL REUS W/TWL LRG LVL3 (GOWN DISPOSABLE) ×3
GOWN STRL REUS W/TWL XL LVL3 (GOWN DISPOSABLE) ×3
KIT BASIN OR (CUSTOM PROCEDURE TRAY) ×3 IMPLANT
KIT ROOM TURNOVER OR (KITS) ×3 IMPLANT
NEEDLE 22X1 1/2 (OR ONLY) (NEEDLE) ×4 IMPLANT
NS IRRIG 1000ML POUR BTL (IV SOLUTION) ×3 IMPLANT
PAD ARMBOARD 7.5X6 YLW CONV (MISCELLANEOUS) ×3 IMPLANT
SUT CHROMIC 3 0 PS 2 (SUTURE) ×6 IMPLANT
SYR CONTROL 10ML LL (SYRINGE) ×3 IMPLANT
TOWEL OR 17X26 10 PK STRL BLUE (TOWEL DISPOSABLE) ×3 IMPLANT
TRAY ENT MC OR (CUSTOM PROCEDURE TRAY) ×3 IMPLANT
TUBING IRRIGATION (MISCELLANEOUS) ×3 IMPLANT
YANKAUER SUCT BULB TIP NO VENT (SUCTIONS) ×3 IMPLANT

## 2015-12-01 NOTE — Anesthesia Procedure Notes (Signed)
Procedure Name: Intubation Date/Time: 12/01/2015 8:12 AM Performed by: Jed LimerickHARDER, Aleksandr Pellow S Pre-anesthesia Checklist: Patient identified, Emergency Drugs available, Suction available and Patient being monitored Patient Re-evaluated:Patient Re-evaluated prior to inductionOxygen Delivery Method: Circle System Utilized Preoxygenation: Pre-oxygenation with 100% oxygen Intubation Type: IV induction Ventilation: Mask ventilation without difficulty Laryngoscope Size: Mac and 3 Grade View: Grade I Nasal Tubes: Nasal Rae, Nasal prep performed and Magill forceps- large, utilized Tube size: 7.0 mm Number of attempts: 1 Placement Confirmation: ETT inserted through vocal cords under direct vision,  positive ETCO2 and breath sounds checked- equal and bilateral Tube secured with: Tape Dental Injury: Teeth and Oropharynx as per pre-operative assessment

## 2015-12-01 NOTE — Op Note (Signed)
12/01/2015  8:41 AM  PATIENT:  [name]  30 y.o. female  PRE-OPERATIVE DIAGNOSIS:  NON RESTORABLE TEETH #'s TWO, THREE, FOUR, SIX, SEVEN, EIGHT, NINE, TEN, ELEVEN, TWELVE, FOURTEEN, FIFTEEN, TWENTY, TWENTY ONE, TWENTY EIGHT, TWENTY NINE, THIRTY AND THIRTY ONE  POST-OPERATIVE DIAGNOSIS:  SAME  PROCEDURE:  Procedure(s): EXTRACTION TEETH TWO, THREE, FOUR, SIX, SEVEN, EIGHT, NINE, TEN, ELEVEN, TWELVE, FOURTEEN, FIFTEEN, TWENTY, TWENTY ONE, TWENTY EIGHT, TWENTY NINE, THIRTY AND THIRTY ONE WITH ALVEOLOPLASTY  SURGEON:  Surgeon(s): Ocie DoyneScott Amylia Collazos, DDS  ANESTHESIA:   local and general  EBL:  minimal  DRAINS: none   SPECIMEN:  No Specimen  COUNTS:  YES  PLAN OF CARE: Discharge to home after PACU  PATIENT DISPOSITION:  PACU - hemodynamically stable.   PROCEDURE DETAILS: Dictation # no  Confirmation number given. Dictation done at 8:45am  Georgia LopesScott M. Kenzlie Disch, DMD 12/01/2015 8:41 AM

## 2015-12-01 NOTE — Anesthesia Postprocedure Evaluation (Signed)
Anesthesia Post Note  Patient: [name]  Procedure(s) Performed: Procedure(s) (LRB): EXTRACTION TEETH TWO, THREE, FOUR, SIX, SEVEN, EIGHT, NINE, TEN, ELEVEN, TWELVE, FOURTEEN, FIFTEEN, TWENTY, TWENTY ONE, TWENTY EIGHT, TWENTY NINE, THIRTY AND THIRTY ONE WITH ALVEOLOPLASTY (N/A)  Patient location during evaluation: PACU Anesthesia Type: General Level of consciousness: awake and alert Pain management: pain level controlled Vital Signs Assessment: post-procedure vital signs reviewed and stable Respiratory status: spontaneous breathing, nonlabored ventilation, respiratory function stable and patient connected to nasal cannula oxygen Cardiovascular status: blood pressure returned to baseline and stable Postop Assessment: no signs of nausea or vomiting Anesthetic complications: no    Last Vitals:  Vitals:   12/01/15 0923 12/01/15 0930  BP: 100/68 99/65  Pulse:  75  Resp:  18  Temp: 36.7 C     Last Pain:  Vitals:   12/01/15 0930  TempSrc:   PainSc: 0-No pain                 Phillips Groutarignan, Lamberto Dinapoli

## 2015-12-01 NOTE — H&P (Signed)
HISTORY AND PHYSICAL  [name] is a 30 y.o. female patient with CC: painful teeth  No diagnosis found.  Past Medical History:  Diagnosis Date  . Abdominal pain, unspecified site   . Anemia, unspecified   . Anxiety   . Anxiety state, unspecified   . Asthma   . Disc herniation    causes sciatica unsure which disc  . Esophageal reflux   . Fatty liver   . Fibromyalgia   . Gastroparesis   . Headache(784.0)   . Heart murmur   . History of narcotic addiction (HCC) 09/30/2012   2010:  Per pt, was addicted to narcotics; mom helped intervene and stopped taking opiods   . Hyperemesis gravidarum with metabolic disturbance, unspecified as to episode of care   . Irritable bowel syndrome   . Irritable bowel syndrome   . Nausea alone   . Other and unspecified noninfectious gastroenteritis and colitis(558.9)   . Other dysphagia   . Pregnant   . PTSD (post-traumatic stress disorder)   . Unspecified asthma(493.90)   . Unspecified constipation     No current facility-administered medications for this encounter.    Allergies  Allergen Reactions  . Azithromycin Nausea And Vomiting    ABDOMINAL PAIN  . Nsaids     UNSPECIFIED REACTION    . Hydrocodone Nausea And Vomiting  . Penicillins Nausea And Vomiting     Has patient had a PCN reaction causing immediate rash, facial/tongue/throat swelling, SOB or lightheadedness with hypotension: No Has patient had a PCN reaction causing severe rash involving mucus membranes or skin necrosis: No Has patient had a PCN reaction that required hospitalization No Has patient had a PCN reaction occurring within the last 10 years: No If all of the above answers are "NO", then may proceed with Cephalosporin use.   . Prednisone Nausea Only and Other (See Comments)    Can take shot, but pill form "caused stomach pain , nausea"   Active Problems:   * No active hospital problems. *  Vitals: Blood pressure 96/71, pulse 78, temperature 98.4 F (36.9  C), temperature source Oral, resp. rate 20, height 5\' 8"  (1.727 m), weight 73 kg (161 lb), last menstrual period 11/13/2015, SpO2 97 %. Lab results: Results for orders placed or performed during the hospital encounter of 12/01/15 (from the past 24 hour(s))  CBC     Status: None   Collection Time: 12/01/15  6:50 AM  Result Value Ref Range   WBC 7.1 4.0 - 10.5 K/uL   RBC 4.40 3.87 - 5.11 MIL/uL   Hemoglobin 13.7 12.0 - 15.0 g/dL   HCT 16.140.1 09.636.0 - 04.546.0 %   MCV 91.1 78.0 - 100.0 fL   MCH 31.1 26.0 - 34.0 pg   MCHC 34.2 30.0 - 36.0 g/dL   RDW 40.913.4 81.111.5 - 91.415.5 %   Platelets 262 150 - 400 K/uL   Radiology Results: No results found. General appearance: alert, cooperative and no distress Head: Normocephalic, without obvious abnormality, atraumatic Eyes: negative Nose: Nares normal. Septum midline. Mucosa normal. No drainage or sinus tenderness. Throat: rampant dental caries. No fluctuance, pululence, trismus. pharynx clear Neck: no adenopathy, supple, symmetrical, trachea midline and thyroid not enlarged, symmetric, no tenderness/mass/nodules Resp: clear to auscultation bilaterally Cardio: regular rate and rhythm, S1, S2 normal, no murmur, click, rub or gallop  Assessment: Multiple nonrestorable teeth secondary to dental caries.  Plan:Multiple dental extractions with alveoloplasty. GA. Day surgery.   Pritesh Sobecki M 12/01/2015

## 2015-12-01 NOTE — Anesthesia Preprocedure Evaluation (Addendum)
Anesthesia Evaluation  Patient identified by MRN, date of birth, ID band Patient awake    Reviewed: Allergy & Precautions, NPO status , Patient's Chart, lab work & pertinent test results  Airway Mallampati: II  TM Distance: >3 FB Neck ROM: Full    Dental no notable dental hx. (+) Poor Dentition   Pulmonary asthma , Current Smoker,    Pulmonary exam normal breath sounds clear to auscultation       Cardiovascular negative cardio ROS Normal cardiovascular exam Rhythm:Regular Rate:Normal     Neuro/Psych negative neurological ROS  negative psych ROS   GI/Hepatic negative GI ROS, (+)     substance abuse  ,   Endo/Other  negative endocrine ROS  Renal/GU negative Renal ROS  negative genitourinary   Musculoskeletal  (+) Fibromyalgia -  Abdominal   Peds negative pediatric ROS (+)  Hematology negative hematology ROS (+)   Anesthesia Other Findings   Reproductive/Obstetrics negative OB ROS                            Anesthesia Physical Anesthesia Plan  ASA: II  Anesthesia Plan: General   Post-op Pain Management:    Induction: Intravenous  Airway Management Planned: Nasal ETT  Additional Equipment:   Intra-op Plan:   Post-operative Plan: Extubation in OR  Informed Consent: I have reviewed the patients History and Physical, chart, labs and discussed the procedure including the risks, benefits and alternatives for the proposed anesthesia with the patient or authorized representative who has indicated his/her understanding and acceptance.   Dental advisory given  Plan Discussed with: CRNA  Anesthesia Plan Comments:         Anesthesia Quick Evaluation

## 2015-12-01 NOTE — Transfer of Care (Signed)
Immediate Anesthesia Transfer of Care Note  Patient: [name]  Procedure(s) Performed: Procedure(s): EXTRACTION TEETH TWO, THREE, FOUR, SIX, SEVEN, EIGHT, NINE, TEN, ELEVEN, TWELVE, FOURTEEN, FIFTEEN, TWENTY, TWENTY ONE, TWENTY EIGHT, TWENTY NINE, THIRTY AND THIRTY ONE WITH ALVEOLOPLASTY (N/A)  Patient Location: PACU  Anesthesia Type:General  Level of Consciousness: awake, alert  and oriented  Airway & Oxygen Therapy: Patient Spontanous Breathing  Post-op Assessment: Report given to RN and Post -op Vital signs reviewed and stable  Post vital signs: Reviewed and stable  Last Vitals:  Vitals:   12/01/15 0607 12/01/15 0852  BP: 96/71 124/83  Pulse: 78 85  Resp: 20 16  Temp: 36.9 C 36.4 C    Last Pain:  Vitals:   12/01/15 0607  TempSrc: Oral      Patients Stated Pain Goal: 3 (12/01/15 0641)  Complications: No apparent anesthesia complications

## 2015-12-01 NOTE — Progress Notes (Signed)
Dr. Willa Fraterarrigan updated and I received clearance to tf to Phase II and discharge home

## 2015-12-02 ENCOUNTER — Encounter (HOSPITAL_COMMUNITY): Payer: Self-pay | Admitting: Oral Surgery

## 2015-12-02 NOTE — Op Note (Signed)
NAME:  Philbert RiserHARRIS, Amaal                ACCOUNT NO.:  [id number]  MEDICAL RECORD NO.:  [id number]  LOCATION:  [facility]                         FACILITY:  [facility]  PHYSICIAN:  Georgia LopesScott M. Shaqueena Mauceri, M.D.  DATE OF BIRTH:  11-28-1985  DATE OF PROCEDURE:  12/01/2015 DATE OF DISCHARGE:  12/01/2015                              OPERATIVE REPORT   PREOPERATIVE DIAGNOSES:  Nonrestorable teeth #2, 3, 4, 6, 7, 8, 9, 10, 11, 12, 14, 15, 20, 21, 28, 29, 30, 31, secondary to dental caries.  POSTOPERATIVE DIAGNOSIS:  Nonrestorable teeth #2, 3, 4, 6, 7, 8, 9, 10, 11, 12, 14, 15, 20, 21, 28, 29, 30, 31, secondary to dental caries.  PROCEDURE:  Extraction of teeth #2, 3, 4, 6, 7, 8, 9, 10, 11, 12, 14, 15, 20, 21, 28, 29, 30, 31, alveoplasty right and left maxilla and right mandible.  SURGEON:  Georgia LopesScott M. Sontee Desena, M.D.  ANESTHESIA:  General, nasal intubation.  DESCRIPTION OF PROCEDURE:  The patient was taken to the operating room and placed on the table in supine position.  General anesthesia was administered intravenously and a nasal endotracheal tube was placed and secured.  The eyes were protected and the patient was draped for the procedure.  Time-out was performed.  The posterior pharynx was suctioned.  A throat pack was placed.  A 2% lidocaine with 1:100,000 epinephrine was infiltrated in an inferior alveolar block on the right and left side and buccal and palatal infiltration in the right and left maxilla.  Total of 17 mL was utilized.  A bite block was placed in the right side of the mouth and a sweetheart retractor was used to retract the tongue.  A #15 blade was used to make an incision around teeth numbers 20 and 21 on the buccal and lingual surfaces at the gingival sulcus and in the maxilla an incision was made beginning at tooth #15, carried forward around 14 buccally and palatally and then incision was made around tooth numbers 12, 11, 10, 9, 8, 7, and 6 in the gingival sulcus, both buccally and  palatally.  The periosteum was reflected from around these teeth.  The teeth were elevated with a 301 elevator and removed from the mouth with a dental forceps.  Then, the bite block and sweetheart retractor were repositioned to the other side of the mouth and a 15 blade used to make an incision around teeth numbers 28,  29, 30, 31 in the mandible and around teeth numbers 2, 3, 4 in the maxilla. The periosteum was reflected with a periosteal elevator and then the teeth were elevated with a 301 elevator and removed from the mouth with a dental forceps.  Tooth #30 fractured upon removal.  This tooth was then sectioned and the roots were removed independently using a 301 elevator and a rongeur.  Then, the previously fabricated maxillary denture was tried in and found to have a good fit.  Alveoplasty was then performed using the bone file and then the areas were irrigated and closed with 3-0 chromic.  Then, the oral cavity was irrigated, suctioned, and the throat pack was removed.  The upper denture and lower partial were positioned into  the mouth and the patient was left in the care of the Anesthesia Service for awakening and transportation to recovery room.  ESTIMATED BLOOD LOSS:  Minimal.  COMPLICATIONS:  None.  SPECIMENS:  None.     Georgia LopesScott M. Maleyah Evans, M.D.     SMJ/MEDQ  D:  12/01/2015  T:  12/02/2015  Job:  [id number]

## 2016-06-13 ENCOUNTER — Encounter (HOSPITAL_COMMUNITY): Payer: Self-pay | Admitting: *Deleted

## 2016-06-13 ENCOUNTER — Emergency Department (HOSPITAL_COMMUNITY)
Admission: EM | Admit: 2016-06-13 | Discharge: 2016-06-13 | Disposition: A | Discharge status: Home / Self Care | Payer: Medicaid Other | Attending: Emergency Medicine | Admitting: Emergency Medicine

## 2016-06-13 ENCOUNTER — Emergency Department (HOSPITAL_COMMUNITY): Payer: Medicaid Other

## 2016-06-13 DIAGNOSIS — M545 Low back pain, unspecified: Secondary | ICD-10-CM

## 2016-06-13 DIAGNOSIS — J45909 Unspecified asthma, uncomplicated: Secondary | ICD-10-CM | POA: Diagnosis not present

## 2016-06-13 DIAGNOSIS — N390 Urinary tract infection, site not specified: Secondary | ICD-10-CM | POA: Insufficient documentation

## 2016-06-13 DIAGNOSIS — Z79899 Other long term (current) drug therapy: Secondary | ICD-10-CM | POA: Diagnosis not present

## 2016-06-13 DIAGNOSIS — F17210 Nicotine dependence, cigarettes, uncomplicated: Secondary | ICD-10-CM | POA: Diagnosis not present

## 2016-06-13 DIAGNOSIS — R1032 Left lower quadrant pain: Secondary | ICD-10-CM | POA: Diagnosis not present

## 2016-06-13 LAB — URINALYSIS, ROUTINE W REFLEX MICROSCOPIC
Bacteria, UA: NONE SEEN
Bilirubin Urine: NEGATIVE
GLUCOSE, UA: NEGATIVE mg/dL
Hgb urine dipstick: NEGATIVE
Ketones, ur: NEGATIVE mg/dL
Nitrite: NEGATIVE
PH: 5 (ref 5.0–8.0)
Protein, ur: NEGATIVE mg/dL
SPECIFIC GRAVITY, URINE: 1.033 — AB (ref 1.005–1.030)

## 2016-06-13 LAB — COMPREHENSIVE METABOLIC PANEL
ALT: 22 U/L (ref 14–54)
AST: 22 U/L (ref 15–41)
Albumin: 4.2 g/dL (ref 3.5–5.0)
Alkaline Phosphatase: 75 U/L (ref 38–126)
Anion gap: 6 (ref 5–15)
BILIRUBIN TOTAL: 0.5 mg/dL (ref 0.3–1.2)
BUN: 5 mg/dL — AB (ref 6–20)
CALCIUM: 9.1 mg/dL (ref 8.9–10.3)
CO2: 28 mmol/L (ref 22–32)
Chloride: 108 mmol/L (ref 101–111)
Creatinine, Ser: 0.46 mg/dL (ref 0.44–1.00)
GFR calc Af Amer: >60 mL/min (ref >60)
Glucose, Bld: 72 mg/dL (ref 65–99)
Potassium: 3.3 mmol/L — ABNORMAL LOW (ref 3.5–5.1)
Sodium: 142 mmol/L (ref 135–145)
TOTAL PROTEIN: 7.6 g/dL (ref 6.5–8.1)

## 2016-06-13 LAB — CBC WITH DIFFERENTIAL/PLATELET
BASOS ABS: 0 10*3/uL (ref 0.0–0.1)
Basophils Relative: 0 %
EOS PCT: 2 %
Eosinophils Absolute: 0.2 10*3/uL (ref 0.0–0.7)
HEMATOCRIT: 41.2 % (ref 36.0–46.0)
Hemoglobin: 14.2 g/dL (ref 12.0–15.0)
LYMPHS ABS: 4.7 10*3/uL — AB (ref 0.7–4.0)
LYMPHS PCT: 34 %
MCH: 31.7 pg (ref 26.0–34.0)
MCHC: 34.5 g/dL (ref 30.0–36.0)
MCV: 92 fL (ref 78.0–100.0)
Monocytes Absolute: 0.6 10*3/uL (ref 0.1–1.0)
Monocytes Relative: 4 %
NEUTROS ABS: 8.1 10*3/uL — AB (ref 1.7–7.7)
Neutrophils Relative %: 60 %
PLATELETS: 290 10*3/uL (ref 150–400)
RBC: 4.48 MIL/uL (ref 3.87–5.11)
RDW: 13.1 % (ref 11.5–15.5)
WBC: 13.6 10*3/uL — ABNORMAL HIGH (ref 4.0–10.5)

## 2016-06-13 LAB — PREGNANCY, URINE: Preg Test, Ur: NEGATIVE

## 2016-06-13 MED ORDER — CEPHALEXIN 500 MG PO CAPS: 500.0000 mg | ORAL_CAPSULE | Freq: Three times a day (TID) | ORAL | 0 refills | Status: AC

## 2016-06-13 MED ORDER — FENTANYL CITRATE (PF) 100 MCG/2ML IJ SOLN
100.0000 ug | Freq: Once | INTRAMUSCULAR | Status: AC
  Administered 2016-06-13: 100 ug via INTRAVENOUS
  Filled 2016-06-13: qty 2

## 2016-06-13 MED ORDER — CYCLOBENZAPRINE HCL 10 MG PO TABS: 10.0000 mg | ORAL_TABLET | Freq: Three times a day (TID) | ORAL | 0 refills | Status: DC | PRN

## 2016-06-13 MED ORDER — CEPHALEXIN 500 MG PO CAPS
500.0000 mg | ORAL_CAPSULE | Freq: Once | ORAL | Status: AC
  Administered 2016-06-13: 500 mg via ORAL
  Filled 2016-06-13: qty 1

## 2016-06-13 NOTE — ED Notes (Signed)
Pt crying and stating she was still in pain.  100 mg of fentanyl given. RN explained to Jeraldine LootsLockwood MD.  MD states for patient to follow up with patient's PCP.  Pt verbalized understanding.

## 2016-06-13 NOTE — ED Triage Notes (Signed)
Pain in lower back radiating into abdomen, thinks she may have a kidney stone, states she has been taking 5 tylenol every 4 hours for relief, advised this is dangerous

## 2016-06-13 NOTE — ED Provider Notes (Signed)
[facility] Provider Note   CSN: [id number] Arrival date & time: 06/13/16  1113     History   Chief Complaint Chief Complaint  Patient presents with  . Back Pain    HPI [name] is a 31 y.o. female.  HPI  31 year old female presents with left-sided back pain. She states that this back pain started about a week ago when she first woke up. His pre-much constant but has been worsening. Seems to move to the right side of her back. There is no midline back pain. Now is starting to wrap around to the anterior abdomen on the left side. No vomiting. She has not had any fevers. She's been taking Tylenol with no relief. She is unable to take NSAIDs due to history of GI bleeding. She denies any vaginal bleeding or discharge. No dysuria but occasionally she has to urinate very quickly and has urgency. She does not feel like she's having frequency. No hematuria. No trauma. Worse with lying flat. No pain with twisting. No radiation down legs. No numbness/weakness of legs. No bowel or bladder incontinence.  Past Medical History:  Diagnosis Date  . Abdominal pain, unspecified site   . Anemia, unspecified   . Anxiety   . Anxiety state, unspecified   . Asthma   . Disc herniation    causes sciatica unsure which disc  . Esophageal reflux   . Fatty liver   . Fibromyalgia   . Gastroparesis   . Headache(784.0)   . Heart murmur   . History of narcotic addiction (HCC) 09/30/2012   2010:  Per pt, was addicted to narcotics; mom helped intervene and stopped taking opiods   . Hyperemesis gravidarum with metabolic disturbance, unspecified as to episode of care   . Irritable bowel syndrome   . Irritable bowel syndrome   . Nausea alone   . Other and unspecified noninfectious gastroenteritis and colitis(558.9)   . Other dysphagia   . Pregnant   . PTSD (post-traumatic stress disorder)   . Unspecified asthma(493.90)   . Unspecified constipation     Patient Active Problem List   Diagnosis  Date Noted  . Sterilization consult 12/03/2012  . Fibromyalgia 03/19/2012  . GERD 02/02/2009  . IRRITABLE BOWEL SYNDROME 02/02/2009  . COLITIS 04/29/2008  . ANXIETY 04/28/2008  . ASTHMA 04/28/2008  . GASTROPARESIS 04/28/2008    Past Surgical History:  Procedure Laterality Date  . APPENDECTOMY    . CHOLECYSTECTOMY    . COLONOSCOPY    . LAPAROSCOPIC BILATERAL SALPINGECTOMY Bilateral 12/08/2012   Procedure: LAPAROSCOPIC BILATERAL SALPINGECTOMY;  Surgeon: Tilda Burrow, MD;  Location: [facility];  Service: Gynecology;  Laterality: Bilateral;  . MULTIPLE EXTRACTIONS WITH ALVEOLOPLASTY N/A 12/01/2015   Procedure: EXTRACTION TEETH TWO, THREE, FOUR, SIX, SEVEN, EIGHT, NINE, TEN, ELEVEN, TWELVE, FOURTEEN, FIFTEEN, TWENTY, TWENTY ONE, TWENTY EIGHT, TWENTY NINE, THIRTY AND THIRTY ONE WITH ALVEOLOPLASTY;  Surgeon: Ocie Doyne, DDS;  Location: [facility];  Service: Oral Surgery;  Laterality: N/A;  . TUBAL LIGATION      OB History    Gravida Para Term Preterm AB Living   3 3 3     3    SAB TAB Ectopic Multiple Live Births           3       Home Medications    Prior to Admission medications   Medication Sig Start Date End Date Taking? Authorizing Provider  acetaminophen (TYLENOL) 500 MG tablet Take 1,000 mg by mouth every 6 (six) hours  as needed for mild pain or moderate pain.   Yes [provider]  ALPRAZolam (XANAX) 0.5 MG tablet Take 1 tablet (0.5 mg total) by mouth 3 (three) times daily as needed for anxiety. 05/29/14  Yes Donnetta Hutchingook, Brian, MD  Multiple Vitamin (MULTIVITAMIN WITH MINERALS) TABS tablet Take 1 tablet by mouth daily.   Yes [provider]  cephALEXin (KEFLEX) 500 MG capsule Take 1 capsule (500 mg total) by mouth 3 (three) times daily. 06/13/16 06/23/16  Pricilla LovelessGoldston, Kaydense Rizo, MD  cyclobenzaprine (FLEXERIL) 10 MG tablet Take 1 tablet (10 mg total) by mouth 3 (three) times daily as needed for muscle spasms. 06/13/16   Pricilla LovelessGoldston, Keianna Signer, MD    Family History Family History    Problem Relation Age of Onset  . Ulcerative colitis Paternal Grandmother   . Cancer Paternal Grandmother   . Colon polyps Paternal Grandfather   . Cancer Paternal Grandfather        thyroid  . Diabetes Father   . Heart disease Father   . Kidney disease Father   . Hypertension Father     Social History Social History  Substance Use Topics  . Smoking status: Current Every Day Smoker    Packs/day: 2.00    Years: 10.00    Types: Cigarettes  . Smokeless tobacco: Never Used  . Alcohol use No     Allergies   Azithromycin; Nsaids; Hydrocodone; Penicillins; and Prednisone   Review of Systems Review of Systems  Constitutional: Negative for fever.  Gastrointestinal: Positive for abdominal pain. Negative for vomiting.  Genitourinary: Positive for urgency. Negative for dysuria, vaginal bleeding and vaginal discharge.  Musculoskeletal: Positive for back pain.  Neurological: Negative for weakness and numbness.  All other systems reviewed and are negative.    Physical Exam Updated Vital Signs BP 120/63 (BP Location: Right Arm)   Pulse 69   Temp 98 F (36.7 C) (Oral)   Resp 18   Ht 5\' 7"  (1.702 m)   Wt 73 kg (161 lb)   LMP 05/30/2016   SpO2 99%   BMI 25.22 kg/m   Physical Exam  Constitutional: She is oriented to person, place, and time. She appears well-developed and well-nourished. No distress.  HENT:  Head: Normocephalic and atraumatic.  Right Ear: External ear normal.  Left Ear: External ear normal.  Nose: Nose normal.  Eyes: Right eye exhibits no discharge. Left eye exhibits no discharge.  Cardiovascular: Normal rate, regular rhythm and normal heart sounds.   Pulmonary/Chest: Effort normal and breath sounds normal.  Abdominal: Soft. There is tenderness (mild) in the left lower quadrant. There is no CVA tenderness.  Musculoskeletal:       Lumbar back: She exhibits tenderness (mild). She exhibits no bony tenderness.       Back:  Neurological: She is alert and  oriented to person, place, and time.  Skin: Skin is warm and dry. She is not diaphoretic.  Nursing note and vitals reviewed.    ED Treatments / Results  Labs (all labs ordered are listed, but only abnormal results are displayed) Labs Reviewed  CBC WITH DIFFERENTIAL/PLATELET - Abnormal; Notable for the following:       Result Value   WBC 13.6 (*)    Neutro Abs 8.1 (*)    Lymphs Abs 4.7 (*)    All other components within normal limits  COMPREHENSIVE METABOLIC PANEL - Abnormal; Notable for the following:    Potassium 3.3 (*)    BUN 5 (*)    All other components  within normal limits  URINALYSIS, ROUTINE W REFLEX MICROSCOPIC - Abnormal; Notable for the following:    APPearance CLOUDY (*)    Specific Gravity, Urine 1.033 (*)    Leukocytes, UA LARGE (*)    Squamous Epithelial / LPF 6-30 (*)    All other components within normal limits  URINE CULTURE  PREGNANCY, URINE    EKG  EKG Interpretation None       Radiology Ct Renal Stone Study  Result Date: 06/13/2016 CLINICAL DATA:  Acute left-sided lower back pain.  Gross hematuria. EXAM: CT ABDOMEN AND PELVIS WITHOUT CONTRAST TECHNIQUE: Multidetector CT imaging of the abdomen and pelvis was performed following the standard protocol without IV contrast. COMPARISON:  CT scan of March 24, 2007. FINDINGS: Lower chest: No acute abnormality. Hepatobiliary: No focal liver abnormality is seen. Status post cholecystectomy. No biliary dilatation. Pancreas: Unremarkable. No pancreatic ductal dilatation or surrounding inflammatory changes. Spleen: Normal in size without focal abnormality. Adrenals/Urinary Tract: Adrenal glands are unremarkable. Kidneys are normal, without renal calculi, focal lesion, or hydronephrosis. Bladder is unremarkable. Stomach/Bowel: There is no evidence of bowel obstruction or inflammation. Patient is status post appendectomy. Vascular/Lymphatic: No significant vascular findings are present. No enlarged abdominal or pelvic  lymph nodes. Reproductive: Uterus appears grossly normal. Patient is reportedly status post bilateral salpingectomy. No definite adnexal abnormality is noted. Other: No abdominal wall hernia or abnormality. No abdominopelvic ascites. Musculoskeletal: No acute or significant osseous findings. IMPRESSION: No acute abnormality seen in the abdomen or pelvis. No hydronephrosis or renal obstruction is noted. No renal or ureteral calculi are noted. Electronically Signed   By: Lupita Raider, M.D.   On: 06/13/2016 14:58    Procedures Procedures (including critical care time)  Medications Ordered in ED Medications  fentaNYL (SUBLIMAZE) injection 100 mcg (100 mcg Intravenous Given 06/13/16 1414)  cephALEXin (KEFLEX) capsule 500 mg (500 mg Oral Given 06/13/16 1614)     Initial Impression / Assessment and Plan / ED Course  I have reviewed the triage vital signs and the nursing notes.  Pertinent labs & imaging results that were available during my care of the patient were reviewed by me and considered in my medical decision making (see chart for details).     CT without renal stone. Urine c/w UTI, given urgency, will treat as pyelo with back pain. This may not be connected, is an atypical presentation. No neuro symptoms. Will treat with tylenol, muscle relaxer, and keflex. Strict return precautions.  Final Clinical Impressions(s) / ED Diagnoses   Final diagnoses:  Acute left-sided low back pain without sciatica  Acute UTI    New Prescriptions Discharge Medication List as of 06/13/2016  3:32 PM    START taking these medications   Details  cephALEXin (KEFLEX) 500 MG capsule Take 1 capsule (500 mg total) by mouth 3 (three) times daily., Starting Thu 06/13/2016, Until Sun 06/23/2016, Print    cyclobenzaprine (FLEXERIL) 10 MG tablet Take 1 tablet (10 mg total) by mouth 3 (three) times daily as needed for muscle spasms., Starting Thu 06/13/2016, Print         Pricilla Loveless, MD 06/13/16 [phone]

## 2016-06-15 LAB — URINE CULTURE

## 2017-02-10 ENCOUNTER — Encounter (HOSPITAL_COMMUNITY): Payer: Self-pay | Admitting: Emergency Medicine

## 2017-02-10 ENCOUNTER — Other Ambulatory Visit: Payer: Self-pay

## 2017-02-10 ENCOUNTER — Emergency Department (HOSPITAL_COMMUNITY)
Admission: EM | Admit: 2017-02-10 | Discharge: 2017-02-11 | Disposition: A | Discharge status: Home / Self Care | Payer: Medicaid Other | Attending: Emergency Medicine | Admitting: Emergency Medicine

## 2017-02-10 DIAGNOSIS — Z79899 Other long term (current) drug therapy: Secondary | ICD-10-CM | POA: Insufficient documentation

## 2017-02-10 DIAGNOSIS — J45909 Unspecified asthma, uncomplicated: Secondary | ICD-10-CM | POA: Diagnosis not present

## 2017-02-10 DIAGNOSIS — F17210 Nicotine dependence, cigarettes, uncomplicated: Secondary | ICD-10-CM | POA: Insufficient documentation

## 2017-02-10 DIAGNOSIS — R200 Anesthesia of skin: Secondary | ICD-10-CM | POA: Diagnosis present

## 2017-02-10 DIAGNOSIS — M797 Fibromyalgia: Secondary | ICD-10-CM | POA: Diagnosis not present

## 2017-02-10 MED ORDER — CYCLOBENZAPRINE HCL 10 MG PO TABS
10.0000 mg | ORAL_TABLET | Freq: Once | ORAL | Status: AC
  Administered 2017-02-11: 10 mg via ORAL
  Filled 2017-02-10: qty 1

## 2017-02-10 MED ORDER — HYDROCODONE-ACETAMINOPHEN 5-325 MG PO TABS
1.0000 | ORAL_TABLET | Freq: Once | ORAL | Status: AC
  Administered 2017-02-11: 1 via ORAL
  Filled 2017-02-10: qty 1

## 2017-02-10 MED ORDER — PROMETHAZINE HCL 12.5 MG PO TABS
12.5000 mg | ORAL_TABLET | Freq: Once | ORAL | Status: AC
  Administered 2017-02-11: 12.5 mg via ORAL
  Filled 2017-02-10: qty 1

## 2017-02-10 NOTE — ED Triage Notes (Signed)
Patient c/o leg numbness, L worse than right, and R arm pain. Patient reports onset of symptoms yesterday. Patient able to lift the R arm.

## 2017-02-10 NOTE — ED Provider Notes (Signed)
[facility] EMERGENCY DEPARTMENT Provider Note   CSN: [id number] Arrival date & time: 02/10/17  2142     History   Chief Complaint Chief Complaint  Patient presents with  . Numbness    HPI [name] is a 32 y.o. female.  Patient is a 32 year old female who presents to the emergency department with a complaint of leg pain and numbness, as well as right hand and shoulder pain. Patient has a history of anemia, anxiety, asthma, fibromyalgia, posttraumatic stress syndrome, irritable bowel, and headaches.  Patient states that over the last 2-3 days she has been noticing increasing pain of her right and left leg, but left leg worse than right.  On yesterday she noted pain of her right shoulder and today she noted pain of the left hand, particularly the index finger of the left hand.  The patient states she has pain with movement, but she also has pain at rest.  She has not had any injury.  She has not had any recent changes in her activities.  She is not been out of the country traveling recently.  There is been no recent changes in her diet or medications.  Patient states that she has not been treated for her fibromyalgia for a couple of years now.  She denies any high fevers recently.  No unusual rash to be reported.  She presents now for assistance with these issues.       Past Medical History:  Diagnosis Date  . Abdominal pain, unspecified site   . Anemia, unspecified   . Anxiety   . Anxiety state, unspecified   . Asthma   . Disc herniation    causes sciatica unsure which disc  . Esophageal reflux   . Fatty liver   . Fibromyalgia   . Gastroparesis   . Headache(784.0)   . Heart murmur   . History of narcotic addiction (HCC) 09/30/2012   2010:  Per pt, was addicted to narcotics; mom helped intervene and stopped taking opiods   . Hyperemesis gravidarum with metabolic disturbance, unspecified as to episode of care   . Irritable bowel syndrome   . Irritable bowel syndrome    . Nausea alone   . Other and unspecified noninfectious gastroenteritis and colitis(558.9)   . Other dysphagia   . Pregnant   . PTSD (post-traumatic stress disorder)   . Unspecified asthma(493.90)   . Unspecified constipation     Patient Active Problem List   Diagnosis Date Noted  . Sterilization consult 12/03/2012  . Fibromyalgia 03/19/2012  . GERD 02/02/2009  . IRRITABLE BOWEL SYNDROME 02/02/2009  . COLITIS 04/29/2008  . ANXIETY 04/28/2008  . ASTHMA 04/28/2008  . GASTROPARESIS 04/28/2008    Past Surgical History:  Procedure Laterality Date  . APPENDECTOMY    . CHOLECYSTECTOMY    . COLONOSCOPY    . LAPAROSCOPIC BILATERAL SALPINGECTOMY Bilateral 12/08/2012   Procedure: LAPAROSCOPIC BILATERAL SALPINGECTOMY;  Surgeon: Tilda Burrow, MD;  Location: [facility];  Service: Gynecology;  Laterality: Bilateral;  . MULTIPLE EXTRACTIONS WITH ALVEOLOPLASTY N/A 12/01/2015   Procedure: EXTRACTION TEETH TWO, THREE, FOUR, SIX, SEVEN, EIGHT, NINE, TEN, ELEVEN, TWELVE, FOURTEEN, FIFTEEN, TWENTY, TWENTY ONE, TWENTY EIGHT, TWENTY NINE, THIRTY AND THIRTY ONE WITH ALVEOLOPLASTY;  Surgeon: Ocie Doyne, DDS;  Location: [facility];  Service: Oral Surgery;  Laterality: N/A;  . TUBAL LIGATION      OB History    Gravida Para Term Preterm AB Living   3 3 3      3  SAB TAB Ectopic Multiple Live Births           3       Home Medications    Prior to Admission medications   Medication Sig Start Date End Date Taking? Authorizing Provider  acetaminophen (TYLENOL) 500 MG tablet Take 1,000 mg by mouth every 6 (six) hours as needed for mild pain or moderate pain.   Yes [provider]  ALPRAZolam (XANAX) 0.5 MG tablet Take 1 tablet (0.5 mg total) by mouth 3 (three) times daily as needed for anxiety. Patient taking differently: Take 0.5 mg by mouth 4 (four) times daily.  05/29/14  Yes Donnetta Hutchingook, Brian, MD  DULoxetine (CYMBALTA) 60 MG capsule Take 60 mg by mouth 2 (two) times daily.   Yes [provider]  Multiple Vitamin (MULTIVITAMIN WITH MINERALS) TABS tablet Take 1 tablet by mouth daily. ALL NATURAL VITAMIN: Made by [employer] (system six)   Yes [provider]  OLANZAPINE PO Take 1 tablet by mouth every morning.   Yes [provider]    Family History Family History  Problem Relation Age of Onset  . Ulcerative colitis Paternal Grandmother   . Cancer Paternal Grandmother   . Colon polyps Paternal Grandfather   . Cancer Paternal Grandfather        thyroid  . Diabetes Father   . Heart disease Father   . Kidney disease Father   . Hypertension Father     Social History Social History   Tobacco Use  . Smoking status: Current Every Day Smoker    Packs/day: 2.00    Years: 10.00    Pack years: 20.00    Types: Cigarettes  . Smokeless tobacco: Never Used  Substance Use Topics  . Alcohol use: No  . Drug use: No    Comment: pt denies 11/30/15     Allergies   Azithromycin; Nsaids; Hydrocodone; Penicillins; and Prednisone   Review of Systems Review of Systems  Constitutional: Negative for activity change.       All ROS Neg except as noted in HPI  HENT: Negative for nosebleeds.   Eyes: Negative for photophobia and discharge.  Respiratory: Negative for cough, shortness of breath and wheezing.   Cardiovascular: Negative for chest pain and palpitations.  Gastrointestinal: Negative for abdominal pain and blood in stool.  Genitourinary: Negative for dysuria, frequency and hematuria.  Musculoskeletal: Positive for arthralgias, joint swelling and myalgias. Negative for back pain and neck pain.  Skin: Negative.   Neurological: Negative for dizziness, seizures and speech difficulty.  Psychiatric/Behavioral: Negative for confusion and hallucinations.     Physical Exam Updated Vital Signs BP 97/68   Pulse (!) 119   Temp (!) 97.5 F (36.4 C)   Resp 20   Ht 5\' 7"  (1.702 m)   Wt 73.9 kg (163 lb)   LMP 02/07/2017   SpO2 98%   BMI 25.53 kg/m     Physical Exam  Constitutional: She is oriented to person, place, and time. She appears well-developed and well-nourished.  Non-toxic appearance.  HENT:  Head: Normocephalic.  Right Ear: Tympanic membrane and external ear normal.  Left Ear: Tympanic membrane and external ear normal.  Eyes: EOM and lids are normal. Pupils are equal, round, and reactive to light.  Neck: Normal range of motion. Neck supple. Carotid bruit is not present.  Cardiovascular: Regular rhythm, normal heart sounds, intact distal pulses and normal pulses. Tachycardia present.  Pulmonary/Chest: Breath sounds normal. No respiratory distress.  Abdominal: Soft. Bowel sounds are  normal. There is no tenderness. There is no guarding.  Musculoskeletal: Normal range of motion.  There is pain with range of motion of the neck.  No rigidity appreciated.  No hot areas appreciated of the cervical spine.  The right shoulder is warm to touch.  Painful with movement.  There is good range of motion of right and left elbow, as well as right and left wrist.  There is pain with swelling of the right second MP joint there is also some redness over this area.  There is soreness with range of motion of the left hip and knee and ankle.  No hot joints appreciated of the lower extremities.  The dorsalis pedis pulses are 2+ bilaterally.  Lymphadenopathy:       Head (right side): No submandibular adenopathy present.       Head (left side): No submandibular adenopathy present.    She has no cervical adenopathy.  Neurological: She is alert and oriented to person, place, and time. She has normal strength. No cranial nerve deficit or sensory deficit.  Skin: Skin is warm and dry.  Psychiatric: She has a normal mood and affect. Her speech is normal.  Nursing note and vitals reviewed.    ED Treatments / Results  Labs (all labs ordered are listed, but only abnormal results are displayed) Labs Reviewed - No data to display  EKG  EKG  Interpretation None       Radiology No results found.  Procedures Procedures (including critical care time)  Medications Ordered in ED Medications - No data to display   Initial Impression / Assessment and Plan / ED Course  I have reviewed the triage vital signs and the nursing notes.  Pertinent labs & imaging results that were available during my care of the patient were reviewed by me and considered in my medical decision making (see chart for details).       Final Clinical Impressions(s) / ED Diagnoses MDM Vital signs reviewed.  Patient has not had any recent operations or procedures.  There is been no recent signs of infection.  No hot joints appreciated.  No recent trauma.  Patient has had some problems with her joints off and on for quite some time, but this is been more pronounced recently.  Patient has a history of fibromyalgia.  I suspect that the patient is having an exacerbation of her fibromyalgia.  Prescription for Flexeril, Neurontin, and Norco given to the patient.  Patient given instructions to see her physicians Dr. Selena Batten or [facility] this week concerning the fibromyalgia.  Patient acknowledges understanding of the instructions.    Final diagnoses:  Fibromyalgia    ED Discharge Orders        Ordered    gabapentin (NEURONTIN) 100 MG capsule  3 times daily     02/11/17 0002    HYDROcodone-acetaminophen (NORCO/VICODIN) 5-325 MG tablet  Every 4 hours PRN     02/11/17 0002    cyclobenzaprine (FLEXERIL) 10 MG tablet  3 times daily     02/11/17 0002    ondansetron (ZOFRAN) 4 MG tablet  Every 6 hours     02/11/17 0002       Ivery Quale, PA-C 02/11/17 0019    Vanetta Mulders, MD 02/11/17 1616

## 2017-02-11 MED ORDER — HYDROCODONE-ACETAMINOPHEN 5-325 MG PO TABS: 1.0000 | ORAL_TABLET | ORAL | 0 refills | Status: DC | PRN

## 2017-02-11 MED ORDER — ONDANSETRON HCL 4 MG PO TABS: 4.0000 mg | ORAL_TABLET | Freq: Four times a day (QID) | ORAL | 0 refills | Status: DC

## 2017-02-11 MED ORDER — GABAPENTIN 100 MG PO CAPS
200.0000 mg | ORAL_CAPSULE | Freq: Once | ORAL | Status: AC
  Administered 2017-02-11: 200 mg via ORAL
  Filled 2017-02-11: qty 2

## 2017-02-11 MED ORDER — GABAPENTIN 100 MG PO CAPS: 100.0000 mg | ORAL_CAPSULE | Freq: Three times a day (TID) | ORAL | 0 refills | Status: DC

## 2017-02-11 MED ORDER — CYCLOBENZAPRINE HCL 10 MG PO TABS
10.0000 mg | ORAL_TABLET | Freq: Three times a day (TID) | ORAL | 0 refills | Status: DC
Start: 2017-02-11 — End: 2018-02-06

## 2017-02-11 NOTE — Discharge Instructions (Signed)
Your examination tonight is consistent with possible fibromyalgia flareup.  Please call Dr. Selena BattenKim on tomorrow for an appointment this week. Use Flexeril 3 times daily, Neurontin 3 times daily.  Use Norco every 4 hours if needed for severe pain.  Use Zofran if you experience nausea with any of these medications.

## 2017-02-13 ENCOUNTER — Emergency Department (HOSPITAL_COMMUNITY): Payer: Medicaid Other

## 2017-02-13 ENCOUNTER — Inpatient Hospital Stay (HOSPITAL_COMMUNITY)
Admission: EM | Admit: 2017-02-13 | Discharge: 2017-04-15 | DRG: 853 | Disposition: A | Discharge status: Home / Self Care | Payer: Medicaid Other | Attending: Family Medicine | Admitting: Family Medicine

## 2017-02-13 ENCOUNTER — Inpatient Hospital Stay (HOSPITAL_COMMUNITY): Admit: 2017-02-13 | Payer: Medicaid Other

## 2017-02-13 ENCOUNTER — Encounter (HOSPITAL_COMMUNITY): Payer: Self-pay

## 2017-02-13 ENCOUNTER — Other Ambulatory Visit: Payer: Self-pay

## 2017-02-13 DIAGNOSIS — E876 Hypokalemia: Secondary | ICD-10-CM | POA: Diagnosis not present

## 2017-02-13 DIAGNOSIS — J9811 Atelectasis: Secondary | ICD-10-CM | POA: Diagnosis not present

## 2017-02-13 DIAGNOSIS — R21 Rash and other nonspecific skin eruption: Secondary | ICD-10-CM | POA: Diagnosis not present

## 2017-02-13 DIAGNOSIS — M797 Fibromyalgia: Secondary | ICD-10-CM | POA: Diagnosis not present

## 2017-02-13 DIAGNOSIS — F1490 Cocaine use, unspecified, uncomplicated: Secondary | ICD-10-CM | POA: Diagnosis not present

## 2017-02-13 DIAGNOSIS — R000 Tachycardia, unspecified: Secondary | ICD-10-CM | POA: Diagnosis present

## 2017-02-13 DIAGNOSIS — F329 Major depressive disorder, single episode, unspecified: Secondary | ICD-10-CM | POA: Diagnosis not present

## 2017-02-13 DIAGNOSIS — I330 Acute and subacute infective endocarditis: Secondary | ICD-10-CM | POA: Diagnosis not present

## 2017-02-13 DIAGNOSIS — R011 Cardiac murmur, unspecified: Secondary | ICD-10-CM | POA: Diagnosis not present

## 2017-02-13 DIAGNOSIS — Z888 Allergy status to other drugs, medicaments and biological substances status: Secondary | ICD-10-CM | POA: Diagnosis not present

## 2017-02-13 DIAGNOSIS — I5032 Chronic diastolic (congestive) heart failure: Secondary | ICD-10-CM | POA: Diagnosis present

## 2017-02-13 DIAGNOSIS — I889 Nonspecific lymphadenitis, unspecified: Secondary | ICD-10-CM | POA: Diagnosis present

## 2017-02-13 DIAGNOSIS — F1990 Other psychoactive substance use, unspecified, uncomplicated: Secondary | ICD-10-CM | POA: Diagnosis not present

## 2017-02-13 DIAGNOSIS — E875 Hyperkalemia: Secondary | ICD-10-CM | POA: Diagnosis not present

## 2017-02-13 DIAGNOSIS — R319 Hematuria, unspecified: Secondary | ICD-10-CM | POA: Diagnosis not present

## 2017-02-13 DIAGNOSIS — N179 Acute kidney failure, unspecified: Secondary | ICD-10-CM | POA: Diagnosis present

## 2017-02-13 DIAGNOSIS — M609 Myositis, unspecified: Secondary | ICD-10-CM | POA: Diagnosis not present

## 2017-02-13 DIAGNOSIS — D638 Anemia in other chronic diseases classified elsewhere: Secondary | ICD-10-CM | POA: Diagnosis present

## 2017-02-13 DIAGNOSIS — R609 Edema, unspecified: Secondary | ICD-10-CM | POA: Diagnosis not present

## 2017-02-13 DIAGNOSIS — R29898 Other symptoms and signs involving the musculoskeletal system: Secondary | ICD-10-CM | POA: Diagnosis present

## 2017-02-13 DIAGNOSIS — D649 Anemia, unspecified: Secondary | ICD-10-CM | POA: Diagnosis not present

## 2017-02-13 DIAGNOSIS — M25519 Pain in unspecified shoulder: Secondary | ICD-10-CM | POA: Diagnosis not present

## 2017-02-13 DIAGNOSIS — I76 Septic arterial embolism: Secondary | ICD-10-CM | POA: Diagnosis present

## 2017-02-13 DIAGNOSIS — F1410 Cocaine abuse, uncomplicated: Secondary | ICD-10-CM | POA: Diagnosis present

## 2017-02-13 DIAGNOSIS — R102 Pelvic and perineal pain: Secondary | ICD-10-CM | POA: Diagnosis present

## 2017-02-13 DIAGNOSIS — I071 Rheumatic tricuspid insufficiency: Secondary | ICD-10-CM | POA: Diagnosis not present

## 2017-02-13 DIAGNOSIS — R7881 Bacteremia: Secondary | ICD-10-CM | POA: Diagnosis not present

## 2017-02-13 DIAGNOSIS — A4101 Sepsis due to Methicillin susceptible Staphylococcus aureus: Principal | ICD-10-CM | POA: Diagnosis present

## 2017-02-13 DIAGNOSIS — R6520 Severe sepsis without septic shock: Secondary | ICD-10-CM | POA: Diagnosis present

## 2017-02-13 DIAGNOSIS — K922 Gastrointestinal hemorrhage, unspecified: Secondary | ICD-10-CM | POA: Diagnosis not present

## 2017-02-13 DIAGNOSIS — D696 Thrombocytopenia, unspecified: Secondary | ICD-10-CM

## 2017-02-13 DIAGNOSIS — G8929 Other chronic pain: Secondary | ICD-10-CM | POA: Diagnosis present

## 2017-02-13 DIAGNOSIS — M009 Pyogenic arthritis, unspecified: Secondary | ICD-10-CM | POA: Diagnosis present

## 2017-02-13 DIAGNOSIS — L0211 Cutaneous abscess of neck: Secondary | ICD-10-CM | POA: Diagnosis not present

## 2017-02-13 DIAGNOSIS — K625 Hemorrhage of anus and rectum: Secondary | ICD-10-CM | POA: Diagnosis not present

## 2017-02-13 DIAGNOSIS — Z881 Allergy status to other antibiotic agents status: Secondary | ICD-10-CM | POA: Diagnosis not present

## 2017-02-13 DIAGNOSIS — R52 Pain, unspecified: Secondary | ICD-10-CM | POA: Diagnosis not present

## 2017-02-13 DIAGNOSIS — Z8249 Family history of ischemic heart disease and other diseases of the circulatory system: Secondary | ICD-10-CM

## 2017-02-13 DIAGNOSIS — F1120 Opioid dependence, uncomplicated: Secondary | ICD-10-CM | POA: Diagnosis present

## 2017-02-13 DIAGNOSIS — R233 Spontaneous ecchymoses: Secondary | ICD-10-CM | POA: Diagnosis not present

## 2017-02-13 DIAGNOSIS — Z880 Allergy status to penicillin: Secondary | ICD-10-CM | POA: Diagnosis not present

## 2017-02-13 DIAGNOSIS — M60009 Infective myositis, unspecified site: Secondary | ICD-10-CM | POA: Diagnosis not present

## 2017-02-13 DIAGNOSIS — R140 Abdominal distension (gaseous): Secondary | ICD-10-CM | POA: Diagnosis not present

## 2017-02-13 DIAGNOSIS — R531 Weakness: Secondary | ICD-10-CM | POA: Diagnosis not present

## 2017-02-13 DIAGNOSIS — R768 Other specified abnormal immunological findings in serum: Secondary | ICD-10-CM | POA: Diagnosis not present

## 2017-02-13 DIAGNOSIS — A419 Sepsis, unspecified organism: Secondary | ICD-10-CM | POA: Diagnosis not present

## 2017-02-13 DIAGNOSIS — M00011 Staphylococcal arthritis, right shoulder: Secondary | ICD-10-CM | POA: Diagnosis not present

## 2017-02-13 DIAGNOSIS — E43 Unspecified severe protein-calorie malnutrition: Secondary | ICD-10-CM | POA: Diagnosis present

## 2017-02-13 DIAGNOSIS — R001 Bradycardia, unspecified: Secondary | ICD-10-CM | POA: Diagnosis not present

## 2017-02-13 DIAGNOSIS — M549 Dorsalgia, unspecified: Secondary | ICD-10-CM | POA: Diagnosis present

## 2017-02-13 DIAGNOSIS — I959 Hypotension, unspecified: Secondary | ICD-10-CM | POA: Diagnosis not present

## 2017-02-13 DIAGNOSIS — M25511 Pain in right shoulder: Secondary | ICD-10-CM | POA: Diagnosis not present

## 2017-02-13 DIAGNOSIS — D61818 Other pancytopenia: Secondary | ICD-10-CM

## 2017-02-13 DIAGNOSIS — B9689 Other specified bacterial agents as the cause of diseases classified elsewhere: Secondary | ICD-10-CM | POA: Diagnosis not present

## 2017-02-13 DIAGNOSIS — I38 Endocarditis, valve unspecified: Secondary | ICD-10-CM | POA: Diagnosis not present

## 2017-02-13 DIAGNOSIS — D509 Iron deficiency anemia, unspecified: Secondary | ICD-10-CM | POA: Diagnosis present

## 2017-02-13 DIAGNOSIS — F17210 Nicotine dependence, cigarettes, uncomplicated: Secondary | ICD-10-CM | POA: Diagnosis not present

## 2017-02-13 DIAGNOSIS — B9561 Methicillin susceptible Staphylococcus aureus infection as the cause of diseases classified elsewhere: Secondary | ICD-10-CM | POA: Diagnosis present

## 2017-02-13 DIAGNOSIS — L03221 Cellulitis of neck: Secondary | ICD-10-CM | POA: Diagnosis present

## 2017-02-13 DIAGNOSIS — E46 Unspecified protein-calorie malnutrition: Secondary | ICD-10-CM | POA: Diagnosis not present

## 2017-02-13 DIAGNOSIS — R0602 Shortness of breath: Secondary | ICD-10-CM

## 2017-02-13 DIAGNOSIS — K760 Fatty (change of) liver, not elsewhere classified: Secondary | ICD-10-CM | POA: Diagnosis not present

## 2017-02-13 DIAGNOSIS — K921 Melena: Secondary | ICD-10-CM | POA: Diagnosis not present

## 2017-02-13 DIAGNOSIS — Z886 Allergy status to analgesic agent status: Secondary | ICD-10-CM | POA: Diagnosis not present

## 2017-02-13 DIAGNOSIS — N19 Unspecified kidney failure: Secondary | ICD-10-CM | POA: Diagnosis not present

## 2017-02-13 DIAGNOSIS — R509 Fever, unspecified: Secondary | ICD-10-CM | POA: Diagnosis not present

## 2017-02-13 DIAGNOSIS — Z978 Presence of other specified devices: Secondary | ICD-10-CM | POA: Diagnosis not present

## 2017-02-13 DIAGNOSIS — M7989 Other specified soft tissue disorders: Secondary | ICD-10-CM | POA: Diagnosis not present

## 2017-02-13 DIAGNOSIS — M25512 Pain in left shoulder: Secondary | ICD-10-CM | POA: Diagnosis not present

## 2017-02-13 DIAGNOSIS — L03114 Cellulitis of left upper limb: Secondary | ICD-10-CM | POA: Diagnosis present

## 2017-02-13 DIAGNOSIS — D72819 Decreased white blood cell count, unspecified: Secondary | ICD-10-CM | POA: Diagnosis not present

## 2017-02-13 DIAGNOSIS — R5381 Other malaise: Secondary | ICD-10-CM | POA: Diagnosis not present

## 2017-02-13 DIAGNOSIS — L0390 Cellulitis, unspecified: Secondary | ICD-10-CM | POA: Diagnosis not present

## 2017-02-13 DIAGNOSIS — Z841 Family history of disorders of kidney and ureter: Secondary | ICD-10-CM

## 2017-02-13 DIAGNOSIS — Z1629 Resistance to other single specified antibiotic: Secondary | ICD-10-CM | POA: Diagnosis not present

## 2017-02-13 DIAGNOSIS — Z9889 Other specified postprocedural states: Secondary | ICD-10-CM | POA: Diagnosis not present

## 2017-02-13 DIAGNOSIS — B1920 Unspecified viral hepatitis C without hepatic coma: Secondary | ICD-10-CM | POA: Diagnosis not present

## 2017-02-13 DIAGNOSIS — M6008 Infective myositis, other site: Secondary | ICD-10-CM | POA: Diagnosis present

## 2017-02-13 DIAGNOSIS — E869 Volume depletion, unspecified: Secondary | ICD-10-CM | POA: Diagnosis present

## 2017-02-13 DIAGNOSIS — Z6827 Body mass index (BMI) 27.0-27.9, adult: Secondary | ICD-10-CM

## 2017-02-13 DIAGNOSIS — Z885 Allergy status to narcotic agent status: Secondary | ICD-10-CM | POA: Diagnosis not present

## 2017-02-13 DIAGNOSIS — I079 Rheumatic tricuspid valve disease, unspecified: Secondary | ICD-10-CM | POA: Diagnosis not present

## 2017-02-13 DIAGNOSIS — B9562 Methicillin resistant Staphylococcus aureus infection as the cause of diseases classified elsewhere: Secondary | ICD-10-CM | POA: Diagnosis not present

## 2017-02-13 DIAGNOSIS — N390 Urinary tract infection, site not specified: Secondary | ICD-10-CM | POA: Diagnosis present

## 2017-02-13 DIAGNOSIS — Q211 Atrial septal defect: Secondary | ICD-10-CM | POA: Diagnosis not present

## 2017-02-13 DIAGNOSIS — M79642 Pain in left hand: Secondary | ICD-10-CM | POA: Diagnosis not present

## 2017-02-13 DIAGNOSIS — Z95828 Presence of other vascular implants and grafts: Secondary | ICD-10-CM | POA: Diagnosis not present

## 2017-02-13 DIAGNOSIS — F418 Other specified anxiety disorders: Secondary | ICD-10-CM | POA: Diagnosis present

## 2017-02-13 DIAGNOSIS — R6510 Systemic inflammatory response syndrome (SIRS) of non-infectious origin without acute organ dysfunction: Secondary | ICD-10-CM | POA: Diagnosis not present

## 2017-02-13 DIAGNOSIS — M898X1 Other specified disorders of bone, shoulder: Secondary | ICD-10-CM | POA: Diagnosis not present

## 2017-02-13 DIAGNOSIS — R590 Localized enlarged lymph nodes: Secondary | ICD-10-CM | POA: Diagnosis not present

## 2017-02-13 DIAGNOSIS — E871 Hypo-osmolality and hyponatremia: Secondary | ICD-10-CM | POA: Diagnosis present

## 2017-02-13 DIAGNOSIS — R5081 Fever presenting with conditions classified elsewhere: Secondary | ICD-10-CM | POA: Diagnosis not present

## 2017-02-13 DIAGNOSIS — Q2112 Patent foramen ovale: Secondary | ICD-10-CM

## 2017-02-13 DIAGNOSIS — I361 Nonrheumatic tricuspid (valve) insufficiency: Secondary | ICD-10-CM | POA: Diagnosis not present

## 2017-02-13 DIAGNOSIS — K219 Gastro-esophageal reflux disease without esophagitis: Secondary | ICD-10-CM | POA: Diagnosis present

## 2017-02-13 DIAGNOSIS — F1910 Other psychoactive substance abuse, uncomplicated: Secondary | ICD-10-CM | POA: Diagnosis not present

## 2017-02-13 DIAGNOSIS — R51 Headache: Secondary | ICD-10-CM | POA: Diagnosis not present

## 2017-02-13 DIAGNOSIS — Z833 Family history of diabetes mellitus: Secondary | ICD-10-CM

## 2017-02-13 DIAGNOSIS — K649 Unspecified hemorrhoids: Secondary | ICD-10-CM | POA: Diagnosis present

## 2017-02-13 DIAGNOSIS — I339 Acute and subacute endocarditis, unspecified: Secondary | ICD-10-CM | POA: Diagnosis not present

## 2017-02-13 DIAGNOSIS — D709 Neutropenia, unspecified: Secondary | ICD-10-CM | POA: Diagnosis not present

## 2017-02-13 DIAGNOSIS — F1590 Other stimulant use, unspecified, uncomplicated: Secondary | ICD-10-CM | POA: Diagnosis not present

## 2017-02-13 DIAGNOSIS — I368 Other nonrheumatic tricuspid valve disorders: Secondary | ICD-10-CM

## 2017-02-13 HISTORY — DX: Pyogenic arthritis, unspecified: M00.9

## 2017-02-13 HISTORY — DX: Bacteremia: R78.81

## 2017-02-13 LAB — CBC WITH DIFFERENTIAL/PLATELET
BASOS ABS: 0 10*3/uL (ref 0.0–0.1)
Basophils Relative: 0 %
EOS ABS: 0 10*3/uL (ref 0.0–0.7)
EOS PCT: 0 %
HCT: 33.1 % — ABNORMAL LOW (ref 36.0–46.0)
Hemoglobin: 11.5 g/dL — ABNORMAL LOW (ref 12.0–15.0)
Lymphocytes Relative: 10 %
Lymphs Abs: 1.4 10*3/uL (ref 0.7–4.0)
MCH: 29.3 pg (ref 26.0–34.0)
MCHC: 34.7 g/dL (ref 30.0–36.0)
MCV: 84.4 fL (ref 78.0–100.0)
Monocytes Absolute: 0.5 10*3/uL (ref 0.1–1.0)
Monocytes Relative: 4 %
NEUTROS PCT: 86 %
Neutro Abs: 12.4 10*3/uL — ABNORMAL HIGH (ref 1.7–7.7)
PLATELETS: 109 10*3/uL — AB (ref 150–400)
RBC: 3.92 MIL/uL (ref 3.87–5.11)
RDW: 14.2 % (ref 11.5–15.5)
WBC: 14.4 10*3/uL — AB (ref 4.0–10.5)

## 2017-02-13 LAB — COMPREHENSIVE METABOLIC PANEL
ALT: 39 U/L (ref 14–54)
ANION GAP: 15 (ref 5–15)
AST: 52 U/L — ABNORMAL HIGH (ref 15–41)
Albumin: 2.5 g/dL — ABNORMAL LOW (ref 3.5–5.0)
Alkaline Phosphatase: 177 U/L — ABNORMAL HIGH (ref 38–126)
BUN: 9 mg/dL (ref 6–20)
CALCIUM: 8.7 mg/dL — AB (ref 8.9–10.3)
CO2: 27 mmol/L (ref 22–32)
Chloride: 88 mmol/L — ABNORMAL LOW (ref 101–111)
Creatinine, Ser: 0.72 mg/dL (ref 0.44–1.00)
GFR calc non Af Amer: >60 mL/min (ref >60)
Glucose, Bld: 107 mg/dL — ABNORMAL HIGH (ref 65–99)
POTASSIUM: 3.7 mmol/L (ref 3.5–5.1)
SODIUM: 130 mmol/L — AB (ref 135–145)
TOTAL PROTEIN: 7 g/dL (ref 6.5–8.1)
Total Bilirubin: 1.9 mg/dL — ABNORMAL HIGH (ref 0.3–1.2)

## 2017-02-13 LAB — TROPONIN I: Troponin I: <0.03 ng/mL (ref <0.03)

## 2017-02-13 LAB — LACTIC ACID, PLASMA: Lactic Acid, Venous: 1 mmol/L (ref 0.5–1.9)

## 2017-02-13 LAB — I-STAT CG4 LACTIC ACID, ED: Lactic Acid, Venous: 2.29 mmol/L (ref 0.5–1.9)

## 2017-02-13 LAB — I-STAT BETA HCG BLOOD, ED (MC, WL, AP ONLY): HCG, QUANTITATIVE: 16.1 m[IU]/mL — AB (ref <5)

## 2017-02-13 LAB — LACTATE DEHYDROGENASE: LDH: 264 U/L — AB (ref 98–192)

## 2017-02-13 LAB — PROCALCITONIN: Procalcitonin: 1.99 ng/mL

## 2017-02-13 LAB — CREATININE, SERUM
Creatinine, Ser: 1.32 mg/dL — ABNORMAL HIGH (ref 0.44–1.00)
GFR calc Af Amer: >60 mL/min (ref >60)
GFR calc non Af Amer: 53 mL/min — ABNORMAL LOW (ref >60)

## 2017-02-13 LAB — CBC
HCT: 24.6 % — ABNORMAL LOW (ref 36.0–46.0)
HEMOGLOBIN: 8.7 g/dL — AB (ref 12.0–15.0)
MCH: 29.4 pg (ref 26.0–34.0)
MCHC: 35.4 g/dL (ref 30.0–36.0)
MCV: 83.1 fL (ref 78.0–100.0)
Platelets: 71 10*3/uL — ABNORMAL LOW (ref 150–400)
RBC: 2.96 MIL/uL — ABNORMAL LOW (ref 3.87–5.11)
RDW: 13.6 % (ref 11.5–15.5)
WBC: 11 10*3/uL — ABNORMAL HIGH (ref 4.0–10.5)

## 2017-02-13 LAB — PROTIME-INR
INR: 1.08
PROTHROMBIN TIME: 13.9 s (ref 11.4–15.2)

## 2017-02-13 LAB — HCG, QUANTITATIVE, PREGNANCY: HCG, BETA CHAIN, QUANT, S: 1 m[IU]/mL (ref <5)

## 2017-02-13 LAB — APTT: aPTT: 49 seconds — ABNORMAL HIGH (ref 24–36)

## 2017-02-13 MED ORDER — SODIUM CHLORIDE 0.9 % IV BOLUS (SEPSIS)
250.0000 mL | Freq: Once | INTRAVENOUS | Status: AC
  Administered 2017-02-13: 250 mL via INTRAVENOUS

## 2017-02-13 MED ORDER — HEPARIN SODIUM (PORCINE) 5000 UNIT/ML IJ SOLN: 5000.0000 [IU] | Freq: Three times a day (TID) | INTRAMUSCULAR | Status: DC

## 2017-02-13 MED ORDER — PIPERACILLIN-TAZOBACTAM 3.375 G IVPB
3.3750 g | Freq: Three times a day (TID) | INTRAVENOUS | Status: DC
  Administered 2017-02-14 (×2): 3.375 g via INTRAVENOUS
  Filled 2017-02-13 (×3): qty 50

## 2017-02-13 MED ORDER — MORPHINE SULFATE (PF) 4 MG/ML IV SOLN
2.0000 mg | INTRAVENOUS | Status: DC | PRN
  Administered 2017-02-14 (×2): 2 mg via INTRAVENOUS
  Filled 2017-02-13 (×2): qty 1

## 2017-02-13 MED ORDER — ENSURE ENLIVE PO LIQD: 237.0000 mL | Freq: Two times a day (BID) | ORAL | Status: DC

## 2017-02-13 MED ORDER — VANCOMYCIN HCL 10 G IV SOLR
1500.0000 mg | Freq: Once | INTRAVENOUS | Status: AC
  Administered 2017-02-13: 1500 mg via INTRAVENOUS
  Filled 2017-02-13: qty 1500

## 2017-02-13 MED ORDER — DULOXETINE HCL 60 MG PO CPEP
60.0000 mg | ORAL_CAPSULE | Freq: Two times a day (BID) | ORAL | Status: DC
  Administered 2017-02-13 – 2017-03-02 (×34): 60 mg via ORAL
  Filled 2017-02-13 (×35): qty 1

## 2017-02-13 MED ORDER — PNEUMOCOCCAL VAC POLYVALENT 25 MCG/0.5ML IJ INJ: 0.5000 mL | INJECTION | INTRAMUSCULAR | Status: DC

## 2017-02-13 MED ORDER — ENOXAPARIN SODIUM 40 MG/0.4ML ~~LOC~~ SOLN
40.0000 mg | SUBCUTANEOUS | Status: DC
  Administered 2017-02-13 – 2017-03-20 (×36): 40 mg via SUBCUTANEOUS
  Filled 2017-02-13 (×36): qty 0.4

## 2017-02-13 MED ORDER — ONDANSETRON HCL 4 MG/2ML IJ SOLN
4.0000 mg | Freq: Four times a day (QID) | INTRAMUSCULAR | Status: DC | PRN
  Administered 2017-03-21: 4 mg via INTRAVENOUS
  Filled 2017-02-13: qty 2

## 2017-02-13 MED ORDER — SODIUM CHLORIDE 0.9 % IV BOLUS (SEPSIS)
1000.0000 mL | Freq: Once | INTRAVENOUS | Status: AC
  Administered 2017-02-13: 1000 mL via INTRAVENOUS

## 2017-02-13 MED ORDER — MORPHINE SULFATE (PF) 4 MG/ML IV SOLN
4.0000 mg | Freq: Once | INTRAVENOUS | Status: AC
  Administered 2017-02-13: 4 mg via INTRAVENOUS
  Filled 2017-02-13: qty 1

## 2017-02-13 MED ORDER — ACETAMINOPHEN 500 MG PO TABS
1000.0000 mg | ORAL_TABLET | Freq: Four times a day (QID) | ORAL | Status: DC | PRN
  Administered 2017-02-13: 1000 mg via ORAL
  Filled 2017-02-13: qty 2

## 2017-02-13 MED ORDER — GABAPENTIN 100 MG PO CAPS: 100.0000 mg | ORAL_CAPSULE | Freq: Three times a day (TID) | ORAL | Status: DC

## 2017-02-13 MED ORDER — ONDANSETRON HCL 4 MG PO TABS
4.0000 mg | ORAL_TABLET | Freq: Four times a day (QID) | ORAL | Status: DC | PRN
  Administered 2017-03-19 – 2017-03-20 (×3): 4 mg via ORAL
  Filled 2017-02-13 (×3): qty 1

## 2017-02-13 MED ORDER — ADULT MULTIVITAMIN W/MINERALS CH
1.0000 | ORAL_TABLET | Freq: Every day | ORAL | Status: DC
Start: 2017-02-13 — End: 2017-04-15
  Administered 2017-02-13 – 2017-04-15 (×60): 1 via ORAL
  Filled 2017-02-13 (×62): qty 1

## 2017-02-13 MED ORDER — ALPRAZOLAM 0.5 MG PO TABS
0.5000 mg | ORAL_TABLET | Freq: Three times a day (TID) | ORAL | Status: DC | PRN
  Administered 2017-02-14: 0.5 mg via ORAL
  Filled 2017-02-13: qty 1

## 2017-02-13 MED ORDER — HYDROCODONE-ACETAMINOPHEN 5-325 MG PO TABS: 1.0000 | ORAL_TABLET | ORAL | Status: DC | PRN

## 2017-02-13 MED ORDER — SODIUM CHLORIDE 0.9 % IV SOLN
INTRAVENOUS | Status: DC
  Administered 2017-02-13 – 2017-02-15 (×2): via INTRAVENOUS

## 2017-02-13 MED ORDER — VANCOMYCIN HCL IN DEXTROSE 750-5 MG/150ML-% IV SOLN
750.0000 mg | Freq: Three times a day (TID) | INTRAVENOUS | Status: DC
  Administered 2017-02-14 (×2): 750 mg via INTRAVENOUS
  Filled 2017-02-13 (×3): qty 150

## 2017-02-13 MED ORDER — CYCLOBENZAPRINE HCL 10 MG PO TABS
10.0000 mg | ORAL_TABLET | Freq: Three times a day (TID) | ORAL | Status: DC
Start: 2017-02-13 — End: 2017-02-14
  Administered 2017-02-13 – 2017-02-14 (×2): 10 mg via ORAL
  Filled 2017-02-13 (×2): qty 1

## 2017-02-13 MED ORDER — ACETAMINOPHEN 325 MG PO TABS
650.0000 mg | ORAL_TABLET | Freq: Once | ORAL | Status: AC
  Administered 2017-02-13: 650 mg via ORAL
  Filled 2017-02-13: qty 2

## 2017-02-13 MED ORDER — PIPERACILLIN-TAZOBACTAM 3.375 G IVPB 30 MIN
3.3750 g | Freq: Once | INTRAVENOUS | Status: AC
  Administered 2017-02-13: 3.375 g via INTRAVENOUS
  Filled 2017-02-13: qty 50

## 2017-02-13 MED ORDER — VANCOMYCIN HCL IN DEXTROSE 1-5 GM/200ML-% IV SOLN: 1000.0000 mg | Freq: Once | INTRAVENOUS | Status: DC

## 2017-02-13 MED ORDER — ONDANSETRON HCL 4 MG/2ML IJ SOLN
4.0000 mg | Freq: Once | INTRAMUSCULAR | Status: AC
  Administered 2017-02-13: 4 mg via INTRAVENOUS
  Filled 2017-02-13: qty 2

## 2017-02-13 NOTE — ED Provider Notes (Signed)
[facility] EMERGENCY DEPARTMENT Provider Note   CSN: [id number] Arrival date & time: 02/13/17  1537     History   Chief Complaint Chief Complaint  Patient presents with  . Weakness    HPI [name] is a 32 y.o. female.  HPI   32 year old female with hx of fibromyalgia, anxiety, opiate addiction, IBS, PTSD presents c/o generalized weakness.  Patient report for the past week she has been feeling increasingly weak and tired.  She also complaining of pain to her L hand, R shoulder and legs.  Pain is waxing and waning.  Report having a sinus infection 2 weeks ago.  Was seen in the ED for this complaint several days prior but was told that her symptoms may be related to her fibromyalgia.  For the past 2 days she developed fever, chills and today she noticed a rash to her legs.  She endorsed nausea without vomiting or diarrhea.  No headache, change of vision, double vision, URI symptoms, chest pain, productive cough, abdominal pain, dysuria.  Admits to using IV drugs approximately 2 weeks ago.  Denies any prior history of PE or DVT, no recent surgery, prolonged bed rest, active cancer or hemoptysis.  She is not any birth control pills.  Her last menstrual period was January 13.      Past Medical History:  Diagnosis Date  . Abdominal pain, unspecified site   . Anemia, unspecified   . Anxiety   . Anxiety state, unspecified   . Asthma   . Disc herniation    causes sciatica unsure which disc  . Esophageal reflux   . Fatty liver   . Fibromyalgia   . Gastroparesis   . Headache(784.0)   . Heart murmur   . History of narcotic addiction ([place]) 09/30/2012   2010:  Per pt, was addicted to narcotics; mom helped intervene and stopped taking opiods   . Hyperemesis gravidarum with metabolic disturbance, unspecified as to episode of care   . Irritable bowel syndrome   . Irritable bowel syndrome   . Nausea alone   . Other and unspecified noninfectious gastroenteritis and  colitis(558.9)   . Other dysphagia   . Pregnant   . PTSD (post-traumatic stress disorder)   . Unspecified asthma(493.90)   . Unspecified constipation     Patient Active Problem List   Diagnosis Date Noted  . Sterilization consult 12/03/2012  . Fibromyalgia 03/19/2012  . GERD 02/02/2009  . IRRITABLE BOWEL SYNDROME 02/02/2009  . COLITIS 04/29/2008  . ANXIETY 04/28/2008  . ASTHMA 04/28/2008  . GASTROPARESIS 04/28/2008    Past Surgical History:  Procedure Laterality Date  . APPENDECTOMY    . CHOLECYSTECTOMY    . COLONOSCOPY    . LAPAROSCOPIC BILATERAL SALPINGECTOMY Bilateral 12/08/2012   Procedure: LAPAROSCOPIC BILATERAL SALPINGECTOMY;  Surgeon: Jonnie Kind, MD;  Location: [facility];  Service: Gynecology;  Laterality: Bilateral;  . MULTIPLE EXTRACTIONS WITH ALVEOLOPLASTY N/A 12/01/2015   Procedure: EXTRACTION TEETH TWO, THREE, FOUR, SIX, SEVEN, EIGHT, NINE, TEN, ELEVEN, TWELVE, FOURTEEN, FIFTEEN, TWENTY, TWENTY ONE, TWENTY EIGHT, TWENTY NINE, THIRTY AND THIRTY ONE WITH ALVEOLOPLASTY;  Surgeon: Diona Browner, DDS;  Location: [facility];  Service: Oral Surgery;  Laterality: N/A;  . TUBAL LIGATION      OB History    Gravida Para Term Preterm AB Living   '3 3 3     3   ' SAB TAB Ectopic Multiple Live Births  3       Home Medications    Prior to Admission medications   Medication Sig Start Date End Date Taking? Authorizing Provider  acetaminophen (TYLENOL) 500 MG tablet Take 1,000 mg by mouth every 6 (six) hours as needed for mild pain or moderate pain.    [provider]  ALPRAZolam Duanne Moron) 0.5 MG tablet Take 1 tablet (0.5 mg total) by mouth 3 (three) times daily as needed for anxiety. Patient taking differently: Take 0.5 mg by mouth 4 (four) times daily.  05/29/14   Nat Christen, MD  cyclobenzaprine (FLEXERIL) 10 MG tablet Take 1 tablet (10 mg total) by mouth 3 (three) times daily. 02/11/17   Lily Kocher, PA-C  DULoxetine (CYMBALTA) 60 MG capsule Take 60 mg by  mouth 2 (two) times daily.    [provider]  gabapentin (NEURONTIN) 100 MG capsule Take 1 capsule (100 mg total) by mouth 3 (three) times daily. 02/11/17   Lily Kocher, PA-C  HYDROcodone-acetaminophen (NORCO/VICODIN) 5-325 MG tablet Take 1 tablet by mouth every 4 (four) hours as needed. 02/11/17   Lily Kocher, PA-C  Multiple Vitamin (MULTIVITAMIN WITH MINERALS) TABS tablet Take 1 tablet by mouth daily. ALL NATURAL VITAMIN: Made by Marvene Staff (system six)    [provider]  OLANZAPINE PO Take 1 tablet by mouth every morning.    [provider]  ondansetron (ZOFRAN) 4 MG tablet Take 1 tablet (4 mg total) by mouth every 6 (six) hours. For nausea or vomiting 02/11/17   Lily Kocher, PA-C    Family History Family History  Problem Relation Age of Onset  . Ulcerative colitis Paternal Grandmother   . Cancer Paternal Grandmother   . Colon polyps Paternal Grandfather   . Cancer Paternal Grandfather        thyroid  . Diabetes Father   . Heart disease Father   . Kidney disease Father   . Hypertension Father     Social History Social History   Tobacco Use  . Smoking status: Current Every Day Smoker    Packs/day: 2.00    Years: 10.00    Pack years: 20.00    Types: Cigarettes  . Smokeless tobacco: Never Used  Substance Use Topics  . Alcohol use: No  . Drug use: No    Comment: pt denies 11/30/15     Allergies   Azithromycin; Nsaids; Hydrocodone; Penicillins; and Prednisone   Review of Systems Review of Systems  All other systems reviewed and are negative.    Physical Exam Updated Vital Signs BP 98/69 (BP Location: Left Arm)   Pulse (!) 132   Temp (!) 101.9 F (38.8 C) (Oral)   Resp (!) 22   LMP 01/28/2017 (Exact Date)   SpO2 98%   Physical Exam  Constitutional: She appears well-developed and well-nourished. No distress.  Patient is nontoxic in appearance  HENT:  Head: Atraumatic.  Scab noted to the bridge of nose does not appears  infected.  Tongue with white coating, no rash in mouth  Eyes: Conjunctivae and EOM are normal. Pupils are equal, round, and reactive to light.  Neck: Normal range of motion. Neck supple.  No nuchal rigidity  Cardiovascular:  Tachycardia without murmur rubs or gallops  Pulmonary/Chest: Effort normal and breath sounds normal. No respiratory distress. She has no wheezes. She has no rales.  Abdominal: Soft. She exhibits no distension. There is no tenderness.  Musculoskeletal: She exhibits edema (2+ pitting edema to bilateral lower extremities).  Neurological: She is alert.  Skin:  Rash (L hand: erythema, warmth, and tenderness to dorsum of 2nd MCP region. BLE: Erythematous petechial rash noted to bilateral feet extending towards ankle.  Intact dorsalis pedis pulse, brisk cap refill.  Mottled skin appearance to bilateral lower extremities) noted.  Psychiatric: She has a normal mood and affect.  Nursing note and vitals reviewed.    ED Treatments / Results  Labs (all labs ordered are listed, but only abnormal results are displayed) Labs Reviewed  COMPREHENSIVE METABOLIC PANEL - Abnormal; Notable for the following components:      Result Value   Sodium 130 (*)    Chloride 88 (*)    Glucose, Bld 107 (*)    Calcium 8.7 (*)    Albumin 2.5 (*)    AST 52 (*)    Alkaline Phosphatase 177 (*)    Total Bilirubin 1.9 (*)    All other components within normal limits  CBC WITH DIFFERENTIAL/PLATELET - Abnormal; Notable for the following components:   WBC 14.4 (*)    Hemoglobin 11.5 (*)    HCT 33.1 (*)    Platelets 109 (*)    Neutro Abs 12.4 (*)    All other components within normal limits  APTT - Abnormal; Notable for the following components:   aPTT 49 (*)    All other components within normal limits  I-STAT CG4 LACTIC ACID, ED - Abnormal; Notable for the following components:   Lactic Acid, Venous 2.29 (*)    All other components within normal limits  I-STAT BETA HCG BLOOD, ED ([facility], [facility], [facility]  ONLY) - Abnormal; Notable for the following components:   I-stat hCG, quantitative 16.1 (*)    All other components within normal limits  CULTURE, BLOOD (ROUTINE X 2)  CULTURE, BLOOD (ROUTINE X 2)  PROTIME-INR  URINALYSIS, ROUTINE W REFLEX MICROSCOPIC  PROCALCITONIN  LACTATE DEHYDROGENASE  TROPONIN I  HCG, QUANTITATIVE, PREGNANCY  I-STAT CG4 LACTIC ACID, ED    EKG  EKG Interpretation  Date/Time:  Thursday February 13 2017 17:15:32 EST Ventricular Rate:  128 PR Interval:    QRS Duration: 91 QT Interval:  284 QTC Calculation: 415 R Axis:   62 Text Interpretation:  Sinus tachycardia Baseline wander in lead(s) V3 When compared to prior, faster rate and possible new S1Q3 pattern.  No STEMI Confirmed by Antony Blackbird [phone]) on 02/13/2017 5:18:00 PM       Radiology Dg Chest 2 View  Result Date: 02/13/2017 CLINICAL DATA:  Pain EXAM: CHEST  2 VIEW COMPARISON:  12/19/2014 FINDINGS: Bibasilar opacities, favor atelectasis. Heart is normal size. No effusions or acute bony abnormality. IMPRESSION: Bibasilar opacities, likely atelectasis. Electronically Signed   By: Rolm Baptise M.D.   On: 02/13/2017 18:34    Procedures Procedures (including critical care time)  Medications Ordered in ED Medications  sodium chloride 0.9 % bolus 1,000 mL (1,000 mLs Intravenous New Bag/Given 02/13/17 1802)    And  sodium chloride 0.9 % bolus 1,000 mL (not administered)    And  sodium chloride 0.9 % bolus 250 mL (250 mLs Intravenous New Bag/Given 02/13/17 1803)  vancomycin (VANCOCIN) 1,500 mg in sodium chloride 0.9 % 500 mL IVPB (1,500 mg Intravenous New Bag/Given 02/13/17 1715)  piperacillin-tazobactam (ZOSYN) IVPB 3.375 g (not administered)  vancomycin (VANCOCIN) IVPB 750 mg/150 ml premix (not administered)  piperacillin-tazobactam (ZOSYN) IVPB 3.375 g (0 g Intravenous Stopped 02/13/17 1824)  ondansetron (ZOFRAN) injection 4 mg (4 mg Intravenous Given 02/13/17 1800)  morphine 4 MG/ML injection 4 mg (4 mg  Intravenous Given  02/13/17 1800)  acetaminophen (TYLENOL) tablet 650 mg (650 mg Oral Given 02/13/17 1715)     Initial Impression / Assessment and Plan / ED Course  I have reviewed the triage vital signs and the nursing notes.  Pertinent labs & imaging results that were available during my care of the patient were reviewed by me and considered in my medical decision making (see chart for details).     BP (!) 88/55   Pulse (!) 112   Temp (!) 101.9 F (38.8 C) (Oral)   Resp (!) 25   LMP 01/28/2017 (Exact Date)   SpO2 96%    Final Clinical Impressions(s) / ED Diagnoses   Final diagnoses:  Cellulitis of left hand  Sepsis affecting skin [facility])    ED Discharge Orders    None     5:06 PM Patient with history of IV drug use here with fever, chills, generalized weakness, and now having petechial rash involving both of her lower extremities. She has redness involving the dorsum of her L hand, concerning for cellulitis.  She also has pitting edema involving the extremities.  She does not have any nuchal rigidity to suggest meningitis.  No cough or shortness of breath, and no dysuria.  Code sepsis initiated after she is noted to have a temperature of 101.9, heart rate of 128, and a blood pressure of 98/69.  Patient will be given broad-spectrum antibiotic as well as fluid resuscitation at 30 mL/kg.  6:30 PM Particular skin changes and milder skin noted in her lower extremities.  Her platelets is 109.  Hemoglobin is 11.5.  Elevated white count of 14.4.  Hepatic function panel was mildly abnormal with an alk phos of 177, and total bili of 1.9.  Normal pro time and INR.  7:04 PM Pt receiving broad spectrum abx.  Her istat beta HcG is 16.1, will obtain an confirmatory test as I have low suspicion for pregnancy.  Appreciate consultation from [facility] Dr. Laren Everts who agrees to see and admit pt for her cellulitis causing sepsis, and potential microvascular angiopathy in a pt with hx of IVDU.   Pt agrees with plan.    CRITICAL CARE Performed by: Domenic Moras Total critical care time: 45 minutes Critical care time was exclusive of separately billable procedures and treating other patients. Critical care was necessary to treat or prevent imminent or life-threatening deterioration. Critical care was time spent personally by me on the following activities: development of treatment plan with patient and/or surrogate as well as nursing, discussions with consultants, evaluation of patient's response to treatment, examination of patient, obtaining history from patient or surrogate, ordering and performing treatments and interventions, ordering and review of laboratory studies, ordering and review of radiographic studies, pulse oximetry and re-evaluation of patient's condition.    Domenic Moras, PA-C 02/13/17 1907    Tegeler, Gwenyth Allegra, MD 02/14/17 [phone]

## 2017-02-13 NOTE — H&P (Signed)
[facility]                                                                                    Patient Demographics  Annaleah Arata, is a 32 y.o. female  CSN: [id number]  MRN: [id number]  DOB - Nov 24, 1985  Admit Date - 02/13/2017  Outpatient Primary MD for the patient is Pearson Grippe, MD   With History of -  Past Medical History:  Diagnosis Date  . Abdominal pain, unspecified site   . Anemia, unspecified   . Anxiety   . Anxiety state, unspecified   . Asthma   . Disc herniation    causes sciatica unsure which disc  . Esophageal reflux   . Fatty liver   . Fibromyalgia   . Gastroparesis   . Headache(784.0)   . Heart murmur   . History of narcotic addiction ([facility]) 09/30/2012   2010:  Per pt, was addicted to narcotics; mom helped intervene and stopped taking opiods   . Hyperemesis gravidarum with metabolic disturbance, unspecified as to episode of care   . Irritable bowel syndrome   . Irritable bowel syndrome   . Nausea alone   . Other and unspecified noninfectious gastroenteritis and colitis(558.9)   . Other dysphagia   . Pregnant   . PTSD (post-traumatic stress disorder)   . Unspecified asthma(493.90)   . Unspecified constipation       Past Surgical History:  Procedure Laterality Date  . APPENDECTOMY    . CHOLECYSTECTOMY    . COLONOSCOPY    . LAPAROSCOPIC BILATERAL SALPINGECTOMY Bilateral 12/08/2012   Procedure: LAPAROSCOPIC BILATERAL SALPINGECTOMY;  Surgeon: Tilda Burrow, MD;  Location: [facility];  Service: Gynecology;  Laterality: Bilateral;  . MULTIPLE EXTRACTIONS WITH ALVEOLOPLASTY N/A 12/01/2015   Procedure: EXTRACTION TEETH TWO, THREE, FOUR, SIX, SEVEN, EIGHT, NINE, TEN, ELEVEN, TWELVE, FOURTEEN, FIFTEEN, TWENTY, TWENTY ONE, TWENTY EIGHT, TWENTY NINE, THIRTY AND THIRTY ONE WITH ALVEOLOPLASTY;  Surgeon: Ocie Doyne, DDS;  Location: [facility];  Service: Oral Surgery;  Laterality: N/A;  . TUBAL LIGATION      in for   Chief Complaint  Patient presents  with  . Weakness     HPI  Mercia Dowe  is a 32 y.o. female, with past medical history significant for fibromyalgia, anemia and history of IV drug abuse presenting with a few days history of fever, chills, generalized weakness muscle aches in addition to arthralgias.  In the emergency room the patient was noted to have fever, leukocytosis and left hand and bilateral lower extremity redness and petechiae .  Her last IV drug abuse was 3 weeks ago.  Patient denies any chest pains or shortness of breath.  She reports nausea but no vomiting.    Review of Systems    In addition to the HPI above,  Fever-chills, No Headache, No changes with Vision or hearing, No problems swallowing food or Liquids, No Chest pain, Cough or Shortness of Breath, No Abdominal pain, No  Vommitting, Bowel movements are regular, No Blood in stool or Urine, No dysuria,  No new weakness, tingling, numbness in any extremity, No recent weight gain or loss, No polyuria, polydypsia or polyphagia, No  significant Mental Stressors.  A full 10 point Review of Systems was done, except as stated above, all other Review of Systems were negative.   Social History Social History   Tobacco Use  . Smoking status: Current Every Day Smoker    Packs/day: 2.00    Years: 10.00    Pack years: 20.00    Types: Cigarettes  . Smokeless tobacco: Never Used  Substance Use Topics  . Alcohol use: No     Family History Family History  Problem Relation Age of Onset  . Ulcerative colitis Paternal Grandmother   . Cancer Paternal Grandmother   . Colon polyps Paternal Grandfather   . Cancer Paternal Grandfather        thyroid  . Diabetes Father   . Heart disease Father   . Kidney disease Father   . Hypertension Father      Prior to Admission medications   Medication Sig Start Date End Date Taking? Authorizing Provider  acetaminophen (TYLENOL) 500 MG tablet Take 1,000 mg by mouth every 6 (six) hours as needed for mild pain or  moderate pain.   Yes [provider]  ALPRAZolam (XANAX) 0.5 MG tablet Take 1 tablet (0.5 mg total) by mouth 3 (three) times daily as needed for anxiety. Patient taking differently: Take 0.5 mg by mouth 4 (four) times daily.  05/29/14  Yes Donnetta Hutching, MD  Buprenorphine HCl-Naloxone HCl (SUBOXONE) 4-1 MG FILM Place 2 Film under the tongue daily.    Yes [provider]  DULoxetine (CYMBALTA) 60 MG capsule Take 60 mg by mouth 2 (two) times daily.   Yes [provider]  Multiple Vitamin (MULTIVITAMIN WITH MINERALS) TABS tablet Take 1 tablet by mouth daily. ALL NATURAL VITAMIN: Made by Romie Jumper (system six)   Yes [provider]  OLANZapine (ZYPREXA) 5 MG tablet Take 1 tablet by mouth every morning.   Yes [provider]  ondansetron (ZOFRAN) 4 MG tablet Take 1 tablet (4 mg total) by mouth every 6 (six) hours. For nausea or vomiting 02/11/17  Yes Ivery Quale, PA-C  cyclobenzaprine (FLEXERIL) 10 MG tablet Take 1 tablet (10 mg total) by mouth 3 (three) times daily. 02/11/17   Ivery Quale, PA-C    Allergies  Allergen Reactions  . Azithromycin Nausea And Vomiting    ABDOMINAL PAIN  . Nsaids     UNSPECIFIED REACTION    . Hydrocodone Nausea And Vomiting  . Penicillins Nausea And Vomiting     Has patient had a PCN reaction causing immediate rash, facial/tongue/throat swelling, SOB or lightheadedness with hypotension: No Has patient had a PCN reaction causing severe rash involving mucus membranes or skin necrosis: No Has patient had a PCN reaction that required hospitalization No Has patient had a PCN reaction occurring within the last 10 years: No If all of the above answers are "NO", then may proceed with Cephalosporin use.   . Prednisone Nausea Only and Other (See Comments)    Can take shot, but pill form "caused stomach pain , nausea"    Physical Exam  Vitals  Blood pressure (!) 86/52, pulse (!) 107, temperature (S) (!) 100.7 F (38.2 C),  temperature source Oral, resp. rate (!) 22, last menstrual period 01/28/2017, SpO2 97 %.   1. General well-developed female looks tired  2. Normal affect and insight, Not Suicidal or Homicidal, Awake Alert, Oriented X 3.  3. No F.N deficits, grossly, patient moving all extremities.  4. Ears and Eyes appear Normal, Conjunctivae clear, PERRLA. Moist Oral  Mucosa.  5. Supple Neck, No JVD, No cervical lymphadenopathy appriciated, No Carotid Bruits.  6. Symmetrical Chest wall movement, Good air movement bilaterally, CTAB.  7. RRR, No Gallops, Rubs or Murmurs, No Parasternal Heave.  8. Positive Bowel Sounds, Abdomen Soft, Non tender, No organomegaly appriciated,No rebound -guarding or rigidity.  9.  No Cyanosis, Normal Skin Turgor, No Skin Rash or Bruise.  10. Good muscle tone,  joints appear normal , lower extremity edema with petechia noted.    Data Review  CBC Recent Labs  Lab 02/13/17 1604  WBC 14.4*  HGB 11.5*  HCT 33.1*  PLT 109*  MCV 84.4  MCH 29.3  MCHC 34.7  RDW 14.2  LYMPHSABS 1.4  MONOABS 0.5  EOSABS 0.0  BASOSABS 0.0   ------------------------------------------------------------------------------------------------------------------  Chemistries  Recent Labs  Lab 02/13/17 1604  NA 130*  K 3.7  CL 88*  CO2 27  GLUCOSE 107*  BUN 9  CREATININE 0.72  CALCIUM 8.7*  AST 52*  ALT 39  ALKPHOS 177*  BILITOT 1.9*   ------------------------------------------------------------------------------------------------------------------ estimated creatinine clearance is 98.2 mL/min (by C-G formula based on SCr of 0.72 mg/dL). ------------------------------------------------------------------------------------------------------------------ No results for input(s): TSH, T4TOTAL, T3FREE, THYROIDAB in the last 72 hours.  Invalid input(s): FREET3   Coagulation profile Recent Labs  Lab 02/13/17 1604  INR 1.08    ------------------------------------------------------------------------------------------------------------------- No results for input(s): DDIMER in the last 72 hours. -------------------------------------------------------------------------------------------------------------------  Cardiac Enzymes Recent Labs  Lab 02/13/17 1701  TROPONINI 0.26*   ------------------------------------------------------------------------------------------------------------------ Invalid input(s): POCBNP   ---------------------------------------------------------------------------------------------------------------  Urinalysis    Component Value Date/Time   COLORURINE YELLOW 06/13/2016 1317   APPEARANCEUR CLOUDY (A) 06/13/2016 1317   LABSPEC 1.033 (H) 06/13/2016 1317   PHURINE 5.0 06/13/2016 1317   GLUCOSEU NEGATIVE 06/13/2016 1317   HGBUR NEGATIVE 06/13/2016 1317   BILIRUBINUR NEGATIVE 06/13/2016 1317   KETONESUR NEGATIVE 06/13/2016 1317   PROTEINUR NEGATIVE 06/13/2016 1317   UROBILINOGEN 0.2 12/07/2012 1510   NITRITE NEGATIVE 06/13/2016 1317   LEUKOCYTESUR LARGE (A) 06/13/2016 1317    ----------------------------------------------------------------------------------------------------------------  A Imaging results:   Dg Chest 2 View  Result Date: 02/13/2017 CLINICAL DATA:  Pain EXAM: CHEST  2 VIEW COMPARISON:  12/19/2014 FINDINGS: Bibasilar opacities, favor atelectasis. Heart is normal size. No effusions or acute bony abnormality. IMPRESSION: Bibasilar opacities, likely atelectasis. Electronically Signed   By: Charlett NoseKevin  Dover M.D.   On: 02/13/2017 18:34      Assessment & Plan  1.  Sepsis 2.  Thrombocytopenia probably related to 1 3.  History of drug abuse/IV meth 4.  Left hand cellulitis 5.  Lower extremities edema/petechiae  Plan  IV antibiotics , vancomycin and Zosyn Check echocardiogram to rule out endocarditis  Check cultures Lower extremity Dopplers, rule out  DVT     DVT Prophylaxis heparin  AM Labs Ordered, also please review Full Orders  Family Communication: Admission, patients condition and plan of care including tests being ordered have been discussed with the patient and father who indicate understanding and agree with the plan and Code Status.  Code Status full  Disposition Plan: Undetermined  Time spent in minutes : 48 minutes  Condition GUARDED   @SIGNATURE @

## 2017-02-13 NOTE — Progress Notes (Signed)
Pharmacy Antibiotic Note  [name] is a 32 y.o. female admitted on 02/13/2017 with sepsis.  Pharmacy has been consulted for vancomycin and zosyn dosing. Tmax is 102.9 and WBC is elevated at 14.4. SCr is WNL and lactic acid is elevated at 2.29.   Plan: Vanc 1500mg  IV x 1 then 750mg  IV Q8H Zosyn 3.375gm IV Q8H (4 hr inf) F/u renal fxn, C&S, clinical status and trough at SS     Temp (24hrs), Avg:101.9 F (38.8 C), Min:101.9 F (38.8 C), Max:101.9 F (38.8 C)  Recent Labs  Lab 02/13/17 1630  LATICACIDVEN 2.29*    CrCl cannot be calculated (Patient's most recent lab result is older than the maximum 21 days allowed.).    Allergies  Allergen Reactions  . Azithromycin Nausea And Vomiting    ABDOMINAL PAIN  . Nsaids     UNSPECIFIED REACTION    . Hydrocodone Nausea And Vomiting  . Penicillins Nausea And Vomiting     Has patient had a PCN reaction causing immediate rash, facial/tongue/throat swelling, SOB or lightheadedness with hypotension: No Has patient had a PCN reaction causing severe rash involving mucus membranes or skin necrosis: No Has patient had a PCN reaction that required hospitalization No Has patient had a PCN reaction occurring within the last 10 years: No If all of the above answers are "NO", then may proceed with Cephalosporin use.   . Prednisone Nausea Only and Other (See Comments)    Can take shot, but pill form "caused stomach pain , nausea"    Antimicrobials this admission: Vanc 1/31>> Zosyn 1/31>>  Dose adjustments this admission: N/A  Microbiology results: Pending  Thank you for allowing pharmacy to be a part of this patient's care.  Rayn Enderson, Drake LeachRachel Lynn 02/13/2017 5:07 PM

## 2017-02-13 NOTE — ED Triage Notes (Signed)
Pt presents with 1 week h/o weakness and pain that began to L groin and L leg and is now generalized.  Pt reports no appetite, inability to walk without difficulty, reports BLE swelling.  Reports shortness of breath especially with exertion.

## 2017-02-13 NOTE — ED Notes (Signed)
CODE SEPSIS ACTIVATED RN ANNA AWARE

## 2017-02-14 ENCOUNTER — Encounter (HOSPITAL_COMMUNITY): Payer: Self-pay | Admitting: Radiology

## 2017-02-14 ENCOUNTER — Inpatient Hospital Stay (HOSPITAL_COMMUNITY): Payer: Medicaid Other

## 2017-02-14 DIAGNOSIS — Z885 Allergy status to narcotic agent status: Secondary | ICD-10-CM

## 2017-02-14 DIAGNOSIS — R233 Spontaneous ecchymoses: Secondary | ICD-10-CM

## 2017-02-14 DIAGNOSIS — Z881 Allergy status to other antibiotic agents status: Secondary | ICD-10-CM

## 2017-02-14 DIAGNOSIS — F329 Major depressive disorder, single episode, unspecified: Secondary | ICD-10-CM

## 2017-02-14 DIAGNOSIS — R000 Tachycardia, unspecified: Secondary | ICD-10-CM

## 2017-02-14 DIAGNOSIS — N19 Unspecified kidney failure: Secondary | ICD-10-CM

## 2017-02-14 DIAGNOSIS — F1590 Other stimulant use, unspecified, uncomplicated: Secondary | ICD-10-CM

## 2017-02-14 DIAGNOSIS — Z886 Allergy status to analgesic agent status: Secondary | ICD-10-CM

## 2017-02-14 DIAGNOSIS — M797 Fibromyalgia: Secondary | ICD-10-CM

## 2017-02-14 DIAGNOSIS — F17210 Nicotine dependence, cigarettes, uncomplicated: Secondary | ICD-10-CM

## 2017-02-14 DIAGNOSIS — Z880 Allergy status to penicillin: Secondary | ICD-10-CM

## 2017-02-14 DIAGNOSIS — I361 Nonrheumatic tricuspid (valve) insufficiency: Secondary | ICD-10-CM

## 2017-02-14 DIAGNOSIS — Z888 Allergy status to other drugs, medicaments and biological substances status: Secondary | ICD-10-CM

## 2017-02-14 DIAGNOSIS — R6520 Severe sepsis without septic shock: Secondary | ICD-10-CM

## 2017-02-14 DIAGNOSIS — A4101 Sepsis due to Methicillin susceptible Staphylococcus aureus: Principal | ICD-10-CM

## 2017-02-14 DIAGNOSIS — F1910 Other psychoactive substance abuse, uncomplicated: Secondary | ICD-10-CM

## 2017-02-14 DIAGNOSIS — M7989 Other specified soft tissue disorders: Secondary | ICD-10-CM

## 2017-02-14 DIAGNOSIS — F1490 Cocaine use, unspecified, uncomplicated: Secondary | ICD-10-CM

## 2017-02-14 LAB — COMPREHENSIVE METABOLIC PANEL
ALBUMIN: 1.8 g/dL — AB (ref 3.5–5.0)
ALT: 29 U/L (ref 14–54)
AST: 40 U/L (ref 15–41)
Alkaline Phosphatase: 132 U/L — ABNORMAL HIGH (ref 38–126)
Anion gap: 9 (ref 5–15)
BUN: 10 mg/dL (ref 6–20)
CHLORIDE: 100 mmol/L — AB (ref 101–111)
CO2: 25 mmol/L (ref 22–32)
CREATININE: 0.76 mg/dL (ref 0.44–1.00)
Calcium: 7.7 mg/dL — ABNORMAL LOW (ref 8.9–10.3)
GFR calc Af Amer: >60 mL/min (ref >60)
GFR calc non Af Amer: >60 mL/min (ref >60)
GLUCOSE: 95 mg/dL (ref 65–99)
Potassium: 3.2 mmol/L — ABNORMAL LOW (ref 3.5–5.1)
Sodium: 134 mmol/L — ABNORMAL LOW (ref 135–145)
Total Bilirubin: 2.2 mg/dL — ABNORMAL HIGH (ref 0.3–1.2)
Total Protein: 5.4 g/dL — ABNORMAL LOW (ref 6.5–8.1)

## 2017-02-14 LAB — BLOOD CULTURE ID PANEL (REFLEXED)
ACINETOBACTER BAUMANNII: NOT DETECTED
CANDIDA TROPICALIS: NOT DETECTED
Candida albicans: NOT DETECTED
Candida glabrata: NOT DETECTED
Candida krusei: NOT DETECTED
Candida parapsilosis: NOT DETECTED
Enterobacter cloacae complex: NOT DETECTED
Enterobacteriaceae species: NOT DETECTED
Enterococcus species: NOT DETECTED
Escherichia coli: NOT DETECTED
HAEMOPHILUS INFLUENZAE: NOT DETECTED
KLEBSIELLA PNEUMONIAE: NOT DETECTED
Klebsiella oxytoca: NOT DETECTED
Listeria monocytogenes: NOT DETECTED
METHICILLIN RESISTANCE: NOT DETECTED
NEISSERIA MENINGITIDIS: NOT DETECTED
PSEUDOMONAS AERUGINOSA: NOT DETECTED
Proteus species: NOT DETECTED
SERRATIA MARCESCENS: NOT DETECTED
STAPHYLOCOCCUS AUREUS BCID: DETECTED — AB
STREPTOCOCCUS SPECIES: NOT DETECTED
Staphylococcus species: DETECTED — AB
Streptococcus agalactiae: NOT DETECTED
Streptococcus pneumoniae: NOT DETECTED
Streptococcus pyogenes: NOT DETECTED

## 2017-02-14 LAB — URINALYSIS, ROUTINE W REFLEX MICROSCOPIC
BILIRUBIN URINE: NEGATIVE
Glucose, UA: NEGATIVE mg/dL
KETONES UR: NEGATIVE mg/dL
Nitrite: POSITIVE — AB
PROTEIN: NEGATIVE mg/dL
Specific Gravity, Urine: 1.014 (ref 1.005–1.030)
pH: 5 (ref 5.0–8.0)

## 2017-02-14 LAB — ECHOCARDIOGRAM COMPLETE
Height: 67 in
Weight: 2698.43 oz

## 2017-02-14 LAB — TROPONIN I: Troponin I: <0.03 ng/mL (ref <0.03)

## 2017-02-14 LAB — MRSA PCR SCREENING: MRSA BY PCR: NEGATIVE

## 2017-02-14 LAB — LACTIC ACID, PLASMA: LACTIC ACID, VENOUS: 0.8 mmol/L (ref 0.5–1.9)

## 2017-02-14 LAB — HIV ANTIBODY (ROUTINE TESTING W REFLEX): HIV SCREEN 4TH GENERATION: NONREACTIVE

## 2017-02-14 MED ORDER — BOOST / RESOURCE BREEZE PO LIQD CUSTOM
1.0000 | Freq: Three times a day (TID) | ORAL | Status: DC
  Administered 2017-02-14 – 2017-02-15 (×3): 1 via ORAL

## 2017-02-14 MED ORDER — BUPRENORPHINE HCL 8 MG SL SUBL
8.0000 mg | SUBLINGUAL_TABLET | Freq: Every day | SUBLINGUAL | Status: DC
  Administered 2017-02-14 – 2017-02-15 (×2): 8 mg via SUBLINGUAL
  Filled 2017-02-14 (×2): qty 1

## 2017-02-14 MED ORDER — ALPRAZOLAM 0.5 MG PO TABS
0.5000 mg | ORAL_TABLET | Freq: Four times a day (QID) | ORAL | Status: DC | PRN
  Administered 2017-02-15 – 2017-03-24 (×76): 0.5 mg via ORAL
  Filled 2017-02-14 (×79): qty 1

## 2017-02-14 MED ORDER — MORPHINE SULFATE (PF) 4 MG/ML IV SOLN: 4.0000 mg | Freq: Once | INTRAVENOUS | Status: DC

## 2017-02-14 MED ORDER — CEFAZOLIN SODIUM-DEXTROSE 2-4 GM/100ML-% IV SOLN
2.0000 g | Freq: Three times a day (TID) | INTRAVENOUS | Status: DC
  Administered 2017-02-14 – 2017-02-16 (×6): 2 g via INTRAVENOUS
  Filled 2017-02-14 (×8): qty 100

## 2017-02-14 MED ORDER — BUPRENORPHINE HCL 8 MG SL SUBL: 8.0000 mg | SUBLINGUAL_TABLET | Freq: Every day | SUBLINGUAL | Status: DC

## 2017-02-14 MED ORDER — POTASSIUM CHLORIDE CRYS ER 20 MEQ PO TBCR
40.0000 meq | EXTENDED_RELEASE_TABLET | Freq: Once | ORAL | Status: AC
  Administered 2017-02-14: 40 meq via ORAL
  Filled 2017-02-14: qty 2

## 2017-02-14 MED ORDER — CYCLOBENZAPRINE HCL 10 MG PO TABS
10.0000 mg | ORAL_TABLET | Freq: Three times a day (TID) | ORAL | Status: DC | PRN
  Administered 2017-02-20 – 2017-03-23 (×33): 10 mg via ORAL
  Filled 2017-02-14 (×36): qty 1

## 2017-02-14 MED ORDER — ALPRAZOLAM 0.5 MG PO TABS
1.0000 mg | ORAL_TABLET | Freq: Once | ORAL | Status: AC
  Administered 2017-02-14: 1 mg via ORAL
  Filled 2017-02-14: qty 2

## 2017-02-14 MED ORDER — GADOBENATE DIMEGLUMINE 529 MG/ML IV SOLN: 15.0000 mL | Freq: Once | INTRAVENOUS | Status: DC

## 2017-02-14 MED ORDER — GADOBENATE DIMEGLUMINE 529 MG/ML IV SOLN
15.0000 mL | Freq: Once | INTRAVENOUS | Status: AC | PRN
  Administered 2017-02-14: 15 mL via INTRAVENOUS

## 2017-02-14 MED ORDER — OLANZAPINE 5 MG PO TABS
5.0000 mg | ORAL_TABLET | Freq: Every morning | ORAL | Status: DC
  Administered 2017-02-15 – 2017-04-14 (×59): 5 mg via ORAL
  Filled 2017-02-14 (×62): qty 1

## 2017-02-14 MED ORDER — ACETAMINOPHEN 325 MG PO TABS
650.0000 mg | ORAL_TABLET | Freq: Four times a day (QID) | ORAL | Status: DC | PRN
  Administered 2017-02-14 – 2017-03-24 (×53): 650 mg via ORAL
  Filled 2017-02-14 (×56): qty 2

## 2017-02-14 NOTE — Progress Notes (Signed)
PHARMACY - PHYSICIAN COMMUNICATION CRITICAL VALUE ALERT - BLOOD CULTURE IDENTIFICATION (BCID)  [name] is an 32 y.o. female who presented to [facility] on 02/13/2017 with a chief complaint of weakness  Assessment:  Hx IVDA, bacteremia, r/o endocarditis   Name of physician (or Provider) Contacted: Dr. Sharon SellerMcClung  Current antibiotics: Vancomycin/Zosyn  Changes to prescribed antibiotics recommended:  Continue vancomycin/zosyn for now, consider de-escalation soon after full ID work-up  Results for orders placed or performed during the hospital encounter of 02/13/17  Blood Culture ID Panel (Reflexed) (Collected: 02/13/2017  4:00 PM)  Result Value Ref Range   Enterococcus species NOT DETECTED NOT DETECTED   Listeria monocytogenes NOT DETECTED NOT DETECTED   Staphylococcus species DETECTED (A) NOT DETECTED   Staphylococcus aureus DETECTED (A) NOT DETECTED   Methicillin resistance NOT DETECTED NOT DETECTED   Streptococcus species NOT DETECTED NOT DETECTED   Streptococcus agalactiae NOT DETECTED NOT DETECTED   Streptococcus pneumoniae NOT DETECTED NOT DETECTED   Streptococcus pyogenes NOT DETECTED NOT DETECTED   Acinetobacter baumannii NOT DETECTED NOT DETECTED   Enterobacteriaceae species NOT DETECTED NOT DETECTED   Enterobacter cloacae complex NOT DETECTED NOT DETECTED   Escherichia coli NOT DETECTED NOT DETECTED   Klebsiella oxytoca NOT DETECTED NOT DETECTED   Klebsiella pneumoniae NOT DETECTED NOT DETECTED   Proteus species NOT DETECTED NOT DETECTED   Serratia marcescens NOT DETECTED NOT DETECTED   Haemophilus influenzae NOT DETECTED NOT DETECTED   Neisseria meningitidis NOT DETECTED NOT DETECTED   Pseudomonas aeruginosa NOT DETECTED NOT DETECTED   Candida albicans NOT DETECTED NOT DETECTED   Candida glabrata NOT DETECTED NOT DETECTED   Candida krusei NOT DETECTED NOT DETECTED   Candida parapsilosis NOT DETECTED NOT DETECTED   Candida tropicalis NOT DETECTED NOT DETECTED     Abran DukeLedford, Chesnee Floren 02/14/2017  7:21 AM

## 2017-02-14 NOTE — Progress Notes (Signed)
[facility] TEAM 1 - Stepdown/ICU TEAM  ALAINA DONATI  ZOX:[id number] DOB: 1985/08/21 DOA: 02/13/2017 PCP: Pearson Grippe, MD    Brief Narrative:  32 y.o. female w/ a hx of fibromyalgia, anemia, and IV drug abuse who presented with a few days history of fever, chills, generalized weakness muscle aches, and arthralgias.  In the emergency room the patient was noted to have fever, leukocytosis and left hand and bilateral lower extremity redness and petechiae.    Significant Events: 1/31 admit   Subjective: The patient is sitting up on the side of the bed having her blood drawn.  She denies chest pain shortness of breath fevers chills nausea or vomiting.  She is alert and interactive.  Assessment & Plan:  Staph aureus bacteremia w/ Severe Sepsis - L hand cellulitis - suspected SBE ID following and directing antibiotic therapy - extent of further workup to be pursued yet to be determined  +UA Should be more than adequately covered with empiric antibiotic initiated in ED  Hyponatremia  Due to volume depletion -follow with ongoing volume expansion  Hypokalemia  Supplement and follow -check magnesium  Normocytic anemia  Likely due to poor nutrition as well as menstrual loss in setting of ongoing IV drug abuse  Thrombocytopenia  Due to bacteremia and IV drug abuse  IV drug abuse   Appears patient is on Suboxone - hold dosing until 8 PM as patient was given IV morphine at 8 AM - resume Suboxone at 8PM and avoid use of other narcotics th/o remainder of hospital stay - if pain proves to be an issue will d/c Suboxone and utilized Methadone instead   DVT prophylaxis: lovenox  Code Status: FULL CODE Family Communication: spoke w/ mother and grandmother at bedside   Disposition Plan: SDU  Consultants:  ID  Antimicrobials:  Zosyn 1/31 > Vanc 1/31 >  Objective: Blood pressure 111/64, pulse 69, temperature 98.7 F (37.1 C), temperature source Oral, resp. rate 20, height 5\' 7"  (1.702 m),  weight 76.5 kg (168 lb 10.4 oz), last menstrual period 01/28/2017, SpO2 97 %.  Intake/Output Summary (Last 24 hours) at 02/14/2017 0920 Last data filed at 02/14/2017 0400 Gross per 24 hour  Intake 5107.5 ml  Output 800 ml  Net 4307.5 ml   Filed Weights   02/13/17 2120  Weight: 76.5 kg (168 lb 10.4 oz)    Examination: General: No acute respiratory distress Lungs: Clear to auscultation bilaterally without wheezes or crackles Cardiovascular: Regular rate and rhythm without murmur gallop or rub normal S1 and S2 Abdomen: Nontender, nondistended, soft, bowel sounds positive, no rebound, no ascites, no appreciable mass Extremities: No significant cyanosis, clubbing, or edema bilateral lower extremities  CBC: Recent Labs  Lab 02/13/17 1604 02/13/17 2136  WBC 14.4* 11.0*  NEUTROABS 12.4*  --   HGB 11.5* 8.7*  HCT 33.1* 24.6*  MCV 84.4 83.1  PLT 109* 71*   Basic Metabolic Panel: Recent Labs  Lab 02/13/17 1604 02/13/17 2136 02/14/17 0305  NA 130*  --  134*  K 3.7  --  3.2*  CL 88*  --  100*  CO2 27  --  25  GLUCOSE 107*  --  95  BUN 9  --  10  CREATININE 0.72 1.32* 0.76  CALCIUM 8.7*  --  7.7*   GFR: Estimated Creatinine Clearance: 107.7 mL/min (by C-G formula based on SCr of 0.76 mg/dL).  Liver Function Tests: Recent Labs  Lab 02/13/17 1604 02/14/17 0305  AST 52* 40  ALT 39  29  ALKPHOS 177* 132*  BILITOT 1.9* 2.2*  PROT 7.0 5.4*  ALBUMIN 2.5* 1.8*    Coagulation Profile: Recent Labs  Lab 02/13/17 1604  INR 1.08    Cardiac Enzymes: Recent Labs  Lab 02/13/17 1701 02/13/17 2136 02/14/17 0305  TROPONINI <0.03 <0.03 <0.03    HbA1C: Hgb A1c MFr Bld  Date/Time Value Ref Range Status  02/03/2009 06:59 PM 5.6 4.6 - 6.1 % Final    Comment:    See lab report for associated comment(s)    Recent Results (from the past 240 hour(s))  Culture, blood (Routine x 2)     Status: None (Preliminary result)   Collection Time: 02/13/17  4:00 PM  Result Value Ref  Range Status   Specimen Description BLOOD RIGHT ANTECUBITAL  Final   Special Requests IN PEDIATRIC BOTTLE Blood Culture adequate volume  Final   Culture  Setup Time   Final    GRAM POSITIVE COCCI IN PEDIATRIC BOTTLE CRITICAL RESULT CALLED TO, READ BACK BY AND VERIFIED WITHMelven Sartorius: J LEDFORD [facility] [id number] 02/14/17 A BROWNING Performed at [facility], [address] [place], [place] [address]    Culture GRAM POSITIVE COCCI  Final   Report Status PENDING  Incomplete  Blood Culture ID Panel (Reflexed)     Status: Abnormal   Collection Time: 02/13/17  4:00 PM  Result Value Ref Range Status   Enterococcus species NOT DETECTED NOT DETECTED Final   Listeria monocytogenes NOT DETECTED NOT DETECTED Final   Staphylococcus species DETECTED (A) NOT DETECTED Final    Comment: CRITICAL RESULT CALLED TO, READ BACK BY AND VERIFIED WITH: J [facility] PHARMD [id number] 02/14/17 A BROWNING    Staphylococcus aureus DETECTED (A) NOT DETECTED Final    Comment: Methicillin (oxacillin) susceptible Staphylococcus aureus (MSSA). Preferred therapy is anti staphylococcal beta lactam antibiotic (Cefazolin or Nafcillin), unless clinically contraindicated. CRITICAL RESULT CALLED TO, READ BACK BY AND VERIFIED WITH: Melven SartoriusJ LEDFORD PHARMD [id number] 02/14/17 A BROWNING    Methicillin resistance NOT DETECTED NOT DETECTED Final   Streptococcus species NOT DETECTED NOT DETECTED Final   Streptococcus agalactiae NOT DETECTED NOT DETECTED Final   Streptococcus pneumoniae NOT DETECTED NOT DETECTED Final   Streptococcus pyogenes NOT DETECTED NOT DETECTED Final   Acinetobacter baumannii NOT DETECTED NOT DETECTED Final   Enterobacteriaceae species NOT DETECTED NOT DETECTED Final   Enterobacter cloacae complex NOT DETECTED NOT DETECTED Final   Escherichia coli NOT DETECTED NOT DETECTED Final   Klebsiella oxytoca NOT DETECTED NOT DETECTED Final   Klebsiella pneumoniae NOT DETECTED NOT DETECTED Final   Proteus species NOT DETECTED NOT DETECTED Final    Serratia marcescens NOT DETECTED NOT DETECTED Final   Haemophilus influenzae NOT DETECTED NOT DETECTED Final   Neisseria meningitidis NOT DETECTED NOT DETECTED Final   Pseudomonas aeruginosa NOT DETECTED NOT DETECTED Final   Candida albicans NOT DETECTED NOT DETECTED Final   Candida glabrata NOT DETECTED NOT DETECTED Final   Candida krusei NOT DETECTED NOT DETECTED Final   Candida parapsilosis NOT DETECTED NOT DETECTED Final   Candida tropicalis NOT DETECTED NOT DETECTED Final    Comment: Performed at [facility], [address] [place], [place] [address]  Culture, blood (Routine x 2)     Status: None (Preliminary result)   Collection Time: 02/13/17  5:00 PM  Result Value Ref Range Status   Specimen Description BLOOD RIGHT ANTECUBITAL  Final   Special Requests   Final    BOTTLES DRAWN AEROBIC AND ANAEROBIC Blood  Culture adequate volume   Culture  Setup Time   Final    GRAM POSITIVE COCCI IN CLUSTERS IN BOTH AEROBIC AND ANAEROBIC BOTTLES CRITICAL VALUE NOTED.  VALUE IS CONSISTENT WITH PREVIOUSLY REPORTED AND CALLED VALUE. Performed at [facility], [address], [place], [place] [address]    Culture GRAM POSITIVE COCCI  Final   Report Status PENDING  Incomplete  MRSA PCR Screening     Status: None   Collection Time: 02/13/17  9:32 PM  Result Value Ref Range Status   MRSA by PCR NEGATIVE NEGATIVE Final    Comment:        The GeneXpert MRSA Assay (FDA approved for NASAL specimens only), is one component of a comprehensive MRSA colonization surveillance program. It is not intended to diagnose MRSA infection nor to guide or monitor treatment for MRSA infections.      Scheduled Meds: . cyclobenzaprine  10 mg Oral TID  . DULoxetine  60 mg Oral BID  . enoxaparin (LOVENOX) injection  40 mg Subcutaneous Q24H  . feeding supplement (ENSURE ENLIVE)  237 mL Oral BID BM  . multivitamin with minerals  1 tablet Oral Daily  . pneumococcal 23 valent vaccine  0.5 mL  Intramuscular Tomorrow-1000     LOS: 1 day   Lonia Blood, MD [facility] Office  [phone] Pager - Text Page per Amion as per below:  On-Call/Text Page:      [email or web address]      password [email or web address]  If 7PM-7AM, please contact night-coverage [email or web address] Password [email or web address] 02/14/2017, 9:20 AM

## 2017-02-14 NOTE — Progress Notes (Signed)
  Echocardiogram 2D Echocardiogram has been performed.  [name] 02/14/2017, 11:30 AM

## 2017-02-14 NOTE — Progress Notes (Addendum)
*  Preliminary Results* Bilateral lower extremity venous duplex completed. Bilateral lower extremities are negative for deep vein thrombosis. There is no evidence of right Baker's cyst. There is evidence of left Baker's cyst.   02/14/2017 4:12 PM Gertie FeyMichelle Lamichael Youkhana, BS, RVT, RDCS, RDMS

## 2017-02-14 NOTE — Consult Note (Signed)
Date of Admission:  02/13/2017          Reason for Consult: Staphylococcus aureus bacteremia    Referring Provider: Connye Burkitt auto consult and Dr. Sharon Seller   Assessment: 1. MSSA uremia with sepsis 2. Significant bilateral lower extremity weakness 3.  IV drug use with methamphetamine 4. Petechial rash   Plan: 1. Narrow to cefazolin 2. MRI L spine with gadolinium 3. She likely will need imaging of shoulder and hand as well with MRI 4. Repeat blood cultures 5. Transthoracic echocardiogram and will need a transesophageal echocardiogram 6. Do not place a central line in this patient 7. She will need long-term help with her IV drug addiction and methamphetamine unfortunately there are not drugs that I know of specifically tailored to assist with this. 8. Screen for HIV and viral hepatitides  Dr. Ninetta Lights to see the patient tomorrow and I will be back on Monday.  Active Problems:   Sepsis (HCC)   Scheduled Meds: . ALPRAZolam  1 mg Oral Once  . buprenorphine  8 mg Sublingual Daily  . DULoxetine  60 mg Oral BID  . enoxaparin (LOVENOX) injection  40 mg Subcutaneous Q24H  . feeding supplement (ENSURE ENLIVE)  237 mL Oral BID BM  . multivitamin with minerals  1 tablet Oral Daily  . OLANZapine  5 mg Oral q morning - 10a  . pneumococcal 23 valent vaccine  0.5 mL Intramuscular Tomorrow-1000   Continuous Infusions: . sodium chloride 75 mL/hr at 02/14/17 1154  .  ceFAZolin (ANCEF) IV     PRN Meds:.acetaminophen, ALPRAZolam, cyclobenzaprine, ondansetron **OR** ondansetron (ZOFRAN) IV  HPI: [name] is a 32 y.o. female with prior past medical history significant for fibromyalgia then leading to opiate addiction who has now developed problems with IV drug abuse though apparently not with opiates but with methamphetamine.  She also has co-morbid crack cocaine use.  He sought care at the emergency department after significant fevers chills over several days and severe myalgias and  generalized weakness making it difficult for her to walk.  Complaining of pain in her legs and numbness.  Several days ago also complaining of hand and shoulder pain.  He was seen at any pen and apparently at that time they thought that she was suffering from fibromyalgia.  And she was discharged.  She then came back to the hospital was admitted yesterday.  Blood cultures were drawn on admission and she was started on vancomycin and Zosyn.  Since then his blood cultures have turned positive for methicillin sensitive Staphylococcus aureus via the BCID.  On exam she can barely lift her legs vs gravity and this is in distinction to her abilities to move her upper extremities.  Her muscles themselves are slightly tender but there is not a clear-cut area where I would think she might have pyomyositis.  Given her weakness I am concerned that she may have metastatic staph infection into the spine and I am ordering an MRI of her lumbar spine.  She had area of erythema and tenderness over her left hand but not an obvious abscess there.  I was unaware of her shoulder pain when I examined her this morning I had asked her if she was hurting anywhere and her response was all over and nowhere specific other than the hand.   Review of Systems: Review of Systems  Constitutional: Positive for chills, diaphoresis, fever and malaise/fatigue. Negative for weight loss.  HENT: Negative for congestion, ear pain, hearing loss and  sore throat.   Eyes: Negative for blurred vision and double vision.  Respiratory: Negative for cough, sputum production, shortness of breath, wheezing and stridor.   Cardiovascular: Negative for chest pain, palpitations and leg swelling.  Gastrointestinal: Negative for abdominal pain, blood in stool, constipation, diarrhea, heartburn, melena, nausea and vomiting.  Genitourinary: Negative for dysuria, flank pain and frequency.  Musculoskeletal: Positive for back pain, joint pain and myalgias.    Skin: Positive for rash.  Neurological: Positive for dizziness, weakness and headaches. Negative for sensory change, focal weakness and loss of consciousness.  Endo/Heme/Allergies: Does not bruise/bleed easily.  Psychiatric/Behavioral: Positive for depression. Negative for substance abuse and suicidal ideas. The patient does not have insomnia.     Past Medical History:  Diagnosis Date  . Abdominal pain, unspecified site   . Anemia, unspecified   . Anxiety   . Anxiety state, unspecified   . Asthma   . Disc herniation    causes sciatica unsure which disc  . Esophageal reflux   . Fatty liver   . Fibromyalgia   . Gastroparesis   . Headache(784.0)   . Heart murmur   . History of narcotic addiction (HCC) 09/30/2012   2010:  Per pt, was addicted to narcotics; mom helped intervene and stopped taking opiods   . Hyperemesis gravidarum with metabolic disturbance, unspecified as to episode of care   . Irritable bowel syndrome   . Irritable bowel syndrome   . Nausea alone   . Other and unspecified noninfectious gastroenteritis and colitis(558.9)   . Other dysphagia   . Pregnant   . PTSD (post-traumatic stress disorder)   . Unspecified asthma(493.90)   . Unspecified constipation     Social History   Tobacco Use  . Smoking status: Current Every Day Smoker    Packs/day: 2.00    Years: 10.00    Pack years: 20.00    Types: Cigarettes  . Smokeless tobacco: Never Used  Substance Use Topics  . Alcohol use: No  . Drug use: No    Comment: pt denies 11/30/15    Family History  Problem Relation Age of Onset  . Ulcerative colitis Paternal Grandmother   . Cancer Paternal Grandmother   . Colon polyps Paternal Grandfather   . Cancer Paternal Grandfather        thyroid  . Diabetes Father   . Heart disease Father   . Kidney disease Father   . Hypertension Father    Allergies  Allergen Reactions  . Azithromycin Nausea And Vomiting    ABDOMINAL PAIN  . Nsaids     UNSPECIFIED  REACTION    . Hydrocodone Nausea And Vomiting  . Penicillins Nausea And Vomiting     Has patient had a PCN reaction causing immediate rash, facial/tongue/throat swelling, SOB or lightheadedness with hypotension: No Has patient had a PCN reaction causing severe rash involving mucus membranes or skin necrosis: No Has patient had a PCN reaction that required hospitalization No Has patient had a PCN reaction occurring within the last 10 years: No If all of the above answers are "NO", then may proceed with Cephalosporin use.   . Prednisone Nausea Only and Other (See Comments)    Can take shot, but pill form "caused stomach pain , nausea"    OBJECTIVE: Blood pressure 95/61, pulse 69, temperature 100.1 F (37.8 C), temperature source Oral, resp. rate (!) 29, height 5\' 7"  (1.702 m), weight 168 lb 10.4 oz (76.5 kg), last menstrual period 01/28/2017, SpO2 96 %.  Physical  Exam  Constitutional: She is oriented to person, place, and time.  HENT:  Head: Normocephalic.  Mouth/Throat: Oropharynx is clear and moist. No oropharyngeal exudate.  Eyes: Pupils are equal, round, and reactive to light. Right eye exhibits no discharge. Left eye exhibits no discharge. No scleral icterus.  Neck: Normal range of motion. Neck supple. No JVD present. No thyromegaly present.  Cardiovascular: Regular rhythm and normal heart sounds. Tachycardia present. Exam reveals no gallop and no friction rub.  No murmur heard. Pulmonary/Chest: Effort normal. No respiratory distress. She has decreased breath sounds in the right lower field and the left lower field. She has no wheezes.  Abdominal: Soft. Bowel sounds are normal. She exhibits no distension. There is no tenderness. There is no rebound and no guarding.  Musculoskeletal:  Muscles in legs are not overtly tender  Neurological: She is alert and oriented to person, place, and time. She has intact cranial nerves. GCS score is 15.  She has 3/5 strength in LE  Skin: Skin is  warm. She is not diaphoretic.  Psychiatric: Memory, affect and judgment normal. Her mood appears anxious.   Skin:  Hand 02/14/17: Area of tenderness over her left dorsum of her hand    Right foot February 14, 2017: Some petechial areas as well as a tattoo    Left foot: Heel rash     Face with area where she picked at scab 02/14/17:      Lab Results Lab Results  Component Value Date   WBC 11.0 (H) 02/13/2017   HGB 8.7 (L) 02/13/2017   HCT 24.6 (L) 02/13/2017   MCV 83.1 02/13/2017   PLT 71 (L) 02/13/2017    Lab Results  Component Value Date   CREATININE 0.76 02/14/2017   BUN 10 02/14/2017   NA 134 (L) 02/14/2017   K 3.2 (L) 02/14/2017   CL 100 (L) 02/14/2017   CO2 25 02/14/2017    Lab Results  Component Value Date   ALT 29 02/14/2017   AST 40 02/14/2017   ALKPHOS 132 (H) 02/14/2017   BILITOT 2.2 (H) 02/14/2017     Microbiology: Recent Results (from the past 240 hour(s))  Culture, blood (Routine x 2)     Status: None (Preliminary result)   Collection Time: 02/13/17  4:00 PM  Result Value Ref Range Status   Specimen Description BLOOD RIGHT ANTECUBITAL  Final   Special Requests IN PEDIATRIC BOTTLE Blood Culture adequate volume  Final   Culture  Setup Time   Final    GRAM POSITIVE COCCI IN PEDIATRIC BOTTLE CRITICAL RESULT CALLED TO, READ BACK BY AND VERIFIED WITHShela Commons [facility] PHARMD [id number] 02/14/17 A BROWNING Performed at [facility], [address] [place], [place] [address]    Culture GRAM POSITIVE COCCI  Final   Report Status PENDING  Incomplete  Blood Culture ID Panel (Reflexed)     Status: Abnormal   Collection Time: 02/13/17  4:00 PM  Result Value Ref Range Status   Enterococcus species NOT DETECTED NOT DETECTED Final   Listeria monocytogenes NOT DETECTED NOT DETECTED Final   Staphylococcus species DETECTED (A) NOT DETECTED Final    Comment: CRITICAL RESULT CALLED TO, READ BACK BY AND VERIFIED WITH: J [facility] PHARMD [id number] 02/14/17 A BROWNING     Staphylococcus aureus DETECTED (A) NOT DETECTED Final    Comment: Methicillin (oxacillin) susceptible Staphylococcus aureus (MSSA). Preferred therapy is anti staphylococcal beta lactam antibiotic (Cefazolin or Nafcillin), unless clinically contraindicated. CRITICAL RESULT CALLED TO, READ BACK BY  AND VERIFIED WITHMelven Sartorius: J LEDFORD [facility] [id number] 02/14/17 A BROWNING    Methicillin resistance NOT DETECTED NOT DETECTED Final   Streptococcus species NOT DETECTED NOT DETECTED Final   Streptococcus agalactiae NOT DETECTED NOT DETECTED Final   Streptococcus pneumoniae NOT DETECTED NOT DETECTED Final   Streptococcus pyogenes NOT DETECTED NOT DETECTED Final   Acinetobacter baumannii NOT DETECTED NOT DETECTED Final   Enterobacteriaceae species NOT DETECTED NOT DETECTED Final   Enterobacter cloacae complex NOT DETECTED NOT DETECTED Final   Escherichia coli NOT DETECTED NOT DETECTED Final   Klebsiella oxytoca NOT DETECTED NOT DETECTED Final   Klebsiella pneumoniae NOT DETECTED NOT DETECTED Final   Proteus species NOT DETECTED NOT DETECTED Final   Serratia marcescens NOT DETECTED NOT DETECTED Final   Haemophilus influenzae NOT DETECTED NOT DETECTED Final   Neisseria meningitidis NOT DETECTED NOT DETECTED Final   Pseudomonas aeruginosa NOT DETECTED NOT DETECTED Final   Candida albicans NOT DETECTED NOT DETECTED Final   Candida glabrata NOT DETECTED NOT DETECTED Final   Candida krusei NOT DETECTED NOT DETECTED Final   Candida parapsilosis NOT DETECTED NOT DETECTED Final   Candida tropicalis NOT DETECTED NOT DETECTED Final    Comment: Performed at [facility], [address] [place], [place] [address]  Culture, blood (Routine x 2)     Status: None (Preliminary result)   Collection Time: 02/13/17  5:00 PM  Result Value Ref Range Status   Specimen Description BLOOD RIGHT ANTECUBITAL  Final   Special Requests   Final    BOTTLES DRAWN AEROBIC AND ANAEROBIC Blood Culture adequate volume   Culture  Setup Time    Final    GRAM POSITIVE COCCI IN CLUSTERS IN BOTH AEROBIC AND ANAEROBIC BOTTLES CRITICAL VALUE NOTED.  VALUE IS CONSISTENT WITH PREVIOUSLY REPORTED AND CALLED VALUE. Performed at [facility], [address] [place], [place] [address]    Culture GRAM POSITIVE COCCI  Final   Report Status PENDING  Incomplete  MRSA PCR Screening     Status: None   Collection Time: 02/13/17  9:32 PM  Result Value Ref Range Status   MRSA by PCR NEGATIVE NEGATIVE Final    Comment:        The GeneXpert MRSA Assay (FDA approved for NASAL specimens only), is one component of a comprehensive MRSA colonization surveillance program. It is not intended to diagnose MRSA infection nor to guide or monitor treatment for MRSA infections.     Acey Lavornelius Van Dam, MD [facility] [phone] pager   [phone] cell 02/14/2017, 1:24 PM

## 2017-02-14 NOTE — Progress Notes (Signed)
Pharmacy Antibiotic Note  [name] is a 32 y.o. female admitted on 02/13/2017 with sepsis.  Pharmacy has been consulted for vancomycin and zosyn dosing, now changing to ancef.   Tmax is 101.9 overnight and WBC is elevated at 11. SCr is WNL.  Plan: Change to ancef 2g q8 hours Pharmacy to sign off and follow peripherally  Height: 5\' 7"  (170.2 cm) Weight: 168 lb 10.4 oz (76.5 kg) IBW/kg (Calculated) : 61.6  Temp (24hrs), Avg:99.5 F (37.5 C), Min:97.7 F (36.5 C), Max:101.9 F (38.8 C)  Recent Labs  Lab 02/13/17 1604 02/13/17 1630 02/13/17 2136 02/13/17 2156 02/14/17 0027 02/14/17 0305  WBC 14.4*  --  11.0*  --   --   --   CREATININE 0.72  --  1.32*  --   --  0.76  LATICACIDVEN  --  2.29*  --  1.0 0.8  --     Estimated Creatinine Clearance: 107.7 mL/min (by C-G formula based on SCr of 0.76 mg/dL).    Allergies  Allergen Reactions  . Azithromycin Nausea And Vomiting    ABDOMINAL PAIN  . Nsaids     UNSPECIFIED REACTION    . Hydrocodone Nausea And Vomiting  . Penicillins Nausea And Vomiting     Has patient had a PCN reaction causing immediate rash, facial/tongue/throat swelling, SOB or lightheadedness with hypotension: No Has patient had a PCN reaction causing severe rash involving mucus membranes or skin necrosis: No Has patient had a PCN reaction that required hospitalization No Has patient had a PCN reaction occurring within the last 10 years: No If all of the above answers are "NO", then may proceed with Cephalosporin use.   . Prednisone Nausea Only and Other (See Comments)    Can take shot, but pill form "caused stomach pain , nausea"    Antimicrobials this admission: Vanc 1/31>>2/1 Zosyn 1/31>>2/1 Ancef 2/1>>  Microbiology results: MSSA in blood  Thank you for allowing pharmacy to be a part of this patient's care.  Severiano GilbertWilson, Frank Rhea 02/14/2017 10:14 AM

## 2017-02-14 NOTE — Progress Notes (Signed)
Initial Nutrition Assessment  DOCUMENTATION CODES:   Not applicable  INTERVENTION:   -Boost Breeze po TID, each supplement provides 250 kcal and 9 grams of protein -Continue MVI daily  NUTRITION DIAGNOSIS:   Inadequate oral intake related to lethargy/confusion, poor appetite as evidenced by meal completion < 50%.  GOAL:   Patient will meet greater than or equal to 90% of their needs  MONITOR:   PO intake, Supplement acceptance, Labs, Weight trends, Skin, I & O's  REASON FOR ASSESSMENT:   Malnutrition Screening Tool    ASSESSMENT:   32 y.o. female w/ a hx of fibromyalgia, anemia, and IV drug abuse who presented with a few days history of fever, chills, generalized weakness muscle aches, and arthralgias.  In the emergency room the patient was noted to have fever, leukocytosis and left hand and bilateral lower extremity redness and petechiae.    Case discussed with RN, who reports pt with very poor oral intake, consuming mainly applesauce.   Pt very lethargic at time of visit, but would answer some close ended questions. Hx obtained from pt mother at bedside, who reports t generally with very good appetite, however, with minimal intake (bites and sips) over the past 2 days related to a cold. Pt has not been eating much during hospitalization due to lethargy. Pt reports consuming mainly soft foods, such as applesauce. Also noted two soda bottles, both half-empty, at bedside.   Pt and mom deny any wt loss, which is consistent with wt hx.   Mom is very concerned over lack of intake. Pt does not like Ensure supplements, but willing to try Boost Breeze.   Labs reviewed: Na: 134 (on IV supplementation), K: 3.2.   NUTRITION - FOCUSED PHYSICAL EXAM:    Most Recent Value  Orbital Region  No depletion  Upper Arm Region  No depletion  Thoracic and Lumbar Region  No depletion  Buccal Region  No depletion  Temple Region  No depletion  Clavicle Bone Region  No depletion  Clavicle  and Acromion Bone Region  No depletion  Scapular Bone Region  No depletion  Dorsal Hand  No depletion  Patellar Region  No depletion  Anterior Thigh Region  No depletion  Posterior Calf Region  No depletion  Edema (RD Assessment)  Mild  Hair  Reviewed  Eyes  Reviewed  Mouth  Reviewed  Skin  Reviewed  Nails  Reviewed       Diet Order:  Diet regular Room service appropriate? Yes; Fluid consistency: Thin  EDUCATION NEEDS:   Education needs have been addressed  Skin:  Skin Assessment: Reviewed RN Assessment  Last BM:  02/11/17  Height:   Ht Readings from Last 1 Encounters:  02/13/17 5\' 7"  (1.702 m)    Weight:   Wt Readings from Last 1 Encounters:  02/13/17 168 lb 10.4 oz (76.5 kg)    Ideal Body Weight:  61.4 kg  BMI:  Body mass index is 26.41 kg/m.  Estimated Nutritional Needs:   Kcal:  1700-1900  Protein:  90-105 grams  Fluid:  1.7-1.9 L    Garyn Arlotta A. Mayford KnifeWilliams, RD, LDN, CDE Pager: [phone] After hours Pager: [phone]

## 2017-02-15 ENCOUNTER — Inpatient Hospital Stay (HOSPITAL_COMMUNITY): Payer: Medicaid Other

## 2017-02-15 DIAGNOSIS — E46 Unspecified protein-calorie malnutrition: Secondary | ICD-10-CM

## 2017-02-15 DIAGNOSIS — R531 Weakness: Secondary | ICD-10-CM

## 2017-02-15 DIAGNOSIS — F1410 Cocaine abuse, uncomplicated: Secondary | ICD-10-CM

## 2017-02-15 LAB — CBC
HCT: 22.2 % — ABNORMAL LOW (ref 36.0–46.0)
Hemoglobin: 7.9 g/dL — ABNORMAL LOW (ref 12.0–15.0)
MCH: 30.2 pg (ref 26.0–34.0)
MCHC: 35.6 g/dL (ref 30.0–36.0)
MCV: 84.7 fL (ref 78.0–100.0)
PLATELETS: 77 10*3/uL — AB (ref 150–400)
RBC: 2.62 MIL/uL — AB (ref 3.87–5.11)
RDW: 14.2 % (ref 11.5–15.5)
WBC: 15.1 10*3/uL — ABNORMAL HIGH (ref 4.0–10.5)

## 2017-02-15 LAB — COMPREHENSIVE METABOLIC PANEL
ALK PHOS: 153 U/L — AB (ref 38–126)
ALT: 27 U/L (ref 14–54)
AST: 47 U/L — ABNORMAL HIGH (ref 15–41)
Albumin: 1.6 g/dL — ABNORMAL LOW (ref 3.5–5.0)
Anion gap: 11 (ref 5–15)
BUN: 8 mg/dL (ref 6–20)
CALCIUM: 8 mg/dL — AB (ref 8.9–10.3)
CO2: 24 mmol/L (ref 22–32)
CREATININE: 0.88 mg/dL (ref 0.44–1.00)
Chloride: 98 mmol/L — ABNORMAL LOW (ref 101–111)
GFR calc non Af Amer: >60 mL/min (ref >60)
GLUCOSE: 126 mg/dL — AB (ref 65–99)
Potassium: 3.1 mmol/L — ABNORMAL LOW (ref 3.5–5.1)
Sodium: 133 mmol/L — ABNORMAL LOW (ref 135–145)
Total Bilirubin: 2 mg/dL — ABNORMAL HIGH (ref 0.3–1.2)
Total Protein: 5.3 g/dL — ABNORMAL LOW (ref 6.5–8.1)

## 2017-02-15 LAB — C-REACTIVE PROTEIN: CRP: 19.9 mg/dL — AB (ref <1.0)

## 2017-02-15 LAB — MAGNESIUM: Magnesium: 2 mg/dL (ref 1.7–2.4)

## 2017-02-15 LAB — SEDIMENTATION RATE: Sed Rate: 122 mm/hr — ABNORMAL HIGH (ref 0–22)

## 2017-02-15 MED ORDER — BUPRENORPHINE HCL 2 MG SL SUBL
6.0000 mg | SUBLINGUAL_TABLET | Freq: Every day | SUBLINGUAL | Status: DC
  Administered 2017-02-16: 6 mg via SUBLINGUAL
  Filled 2017-02-15: qty 3

## 2017-02-15 MED ORDER — POTASSIUM CHLORIDE CRYS ER 20 MEQ PO TBCR
40.0000 meq | EXTENDED_RELEASE_TABLET | Freq: Two times a day (BID) | ORAL | Status: AC
  Administered 2017-02-15 – 2017-02-16 (×3): 40 meq via ORAL
  Filled 2017-02-15 (×3): qty 2

## 2017-02-15 NOTE — Progress Notes (Signed)
INFECTIOUS DISEASE PROGRESS NOTE  ID: [name] is a 32 y.o. female with  Active Problems:   Sepsis (HCC)  Subjective: Fever o/n. No change in LE strength (per Rn she was able to walk to bedside commode without difficulty) Per mom is having pelvic pain.    Abtx:  Anti-infectives (From admission, onward)   Start     Dose/Rate Route Frequency Ordered Stop   02/14/17 1400  ceFAZolin (ANCEF) IVPB 2g/100 mL premix     2 g 200 mL/hr over 30 Minutes Intravenous Every 8 hours 02/14/17 1013     02/14/17 0200  vancomycin (VANCOCIN) IVPB 750 mg/150 ml premix  Status:  Discontinued     750 mg 150 mL/hr over 60 Minutes Intravenous Every 8 hours 02/13/17 1739 02/14/17 1017   02/14/17 0000  piperacillin-tazobactam (ZOSYN) IVPB 3.375 g  Status:  Discontinued     3.375 g 12.5 mL/hr over 240 Minutes Intravenous Every 8 hours 02/13/17 1739 02/14/17 1013   02/13/17 1715  piperacillin-tazobactam (ZOSYN) IVPB 3.375 g     3.375 g 100 mL/hr over 30 Minutes Intravenous  Once 02/13/17 1701 02/13/17 1824   02/13/17 1715  vancomycin (VANCOCIN) IVPB 1000 mg/200 mL premix  Status:  Discontinued     1,000 mg 200 mL/hr over 60 Minutes Intravenous  Once 02/13/17 1701 02/13/17 1705   02/13/17 1715  vancomycin (VANCOCIN) 1,500 mg in sodium chloride 0.9 % 500 mL IVPB     1,500 mg 250 mL/hr over 120 Minutes Intravenous  Once 02/13/17 1705 02/13/17 2300      Medications:  Scheduled: . buprenorphine  8 mg Sublingual Daily  . DULoxetine  60 mg Oral BID  . enoxaparin (LOVENOX) injection  40 mg Subcutaneous Q24H  . feeding supplement  1 Container Oral TID BM  . gadobenate dimeglumine  15 mL Intravenous Once  . multivitamin with minerals  1 tablet Oral Daily  . OLANZapine  5 mg Oral q morning - 10a  . pneumococcal 23 valent vaccine  0.5 mL Intramuscular Tomorrow-1000    Objective: Vital signs in last 24 hours: Temp:  [98.8 F (37.1 C)-101.7 F (38.7 C)] 99.2 F (37.3 C) (02/02 0741) Pulse Rate:   [95-112] 95 (02/02 0435) Resp:  [21-33] 23 (02/02 0741) BP: (87-101)/(57-62) 92/60 (02/02 0741) SpO2:  [91 %-96 %] 95 % (02/02 0741)   General appearance: alert, moderate distress and rigors Resp: clear to auscultation bilaterally Cardio: tachycardia GI: normal findings: bowel sounds normal and soft, non-tender Extremities: edema 2+ Skin: livido?  Lab Results Recent Labs    02/13/17 2136 02/14/17 0305 02/15/17 0247  WBC 11.0*  --  15.1*  HGB 8.7*  --  7.9*  HCT 24.6*  --  22.2*  NA  --  134* 133*  K  --  3.2* 3.1*  CL  --  100* 98*  CO2  --  25 24  BUN  --  10 8  CREATININE 1.32* 0.76 0.88   Liver Panel Recent Labs    02/14/17 0305 02/15/17 0247  PROT 5.4* 5.3*  ALBUMIN 1.8* 1.6*  AST 40 47*  ALT 29 27  ALKPHOS 132* 153*  BILITOT 2.2* 2.0*   Sedimentation Rate Recent Labs    02/15/17 0247  ESRSEDRATE 122*   C-Reactive Protein Recent Labs    02/15/17 0247  CRP 19.9*    Microbiology: Recent Results (from the past 240 hour(s))  Culture, blood (Routine x 2)     Status: Abnormal (Preliminary result)  Collection Time: 02/13/17  4:00 PM  Result Value Ref Range Status   Specimen Description BLOOD RIGHT ANTECUBITAL  Final   Special Requests IN PEDIATRIC BOTTLE Blood Culture adequate volume  Final   Culture  Setup Time   Final    GRAM POSITIVE COCCI IN PEDIATRIC BOTTLE CRITICAL RESULT CALLED TO, READ BACK BY AND VERIFIED WITH: Melven SartoriusJ LEDFORD PHARMD [id number] 02/14/17 A BROWNING    Culture (A)  Final    STAPHYLOCOCCUS AUREUS SUSCEPTIBILITIES TO FOLLOW Performed at [facility], [address] [place], [place] [address]    Report Status PENDING  Incomplete  Blood Culture ID Panel (Reflexed)     Status: Abnormal   Collection Time: 02/13/17  4:00 PM  Result Value Ref Range Status   Enterococcus species NOT DETECTED NOT DETECTED Final   Listeria monocytogenes NOT DETECTED NOT DETECTED Final   Staphylococcus species DETECTED (A) NOT DETECTED Final    Comment:  CRITICAL RESULT CALLED TO, READ BACK BY AND VERIFIED WITH: J [facility] PHARMD [id number] 02/14/17 A BROWNING    Staphylococcus aureus DETECTED (A) NOT DETECTED Final    Comment: Methicillin (oxacillin) susceptible Staphylococcus aureus (MSSA). Preferred therapy is anti staphylococcal beta lactam antibiotic (Cefazolin or Nafcillin), unless clinically contraindicated. CRITICAL RESULT CALLED TO, READ BACK BY AND VERIFIED WITH: Melven SartoriusJ LEDFORD PHARMD [id number] 02/14/17 A BROWNING    Methicillin resistance NOT DETECTED NOT DETECTED Final   Streptococcus species NOT DETECTED NOT DETECTED Final   Streptococcus agalactiae NOT DETECTED NOT DETECTED Final   Streptococcus pneumoniae NOT DETECTED NOT DETECTED Final   Streptococcus pyogenes NOT DETECTED NOT DETECTED Final   Acinetobacter baumannii NOT DETECTED NOT DETECTED Final   Enterobacteriaceae species NOT DETECTED NOT DETECTED Final   Enterobacter cloacae complex NOT DETECTED NOT DETECTED Final   Escherichia coli NOT DETECTED NOT DETECTED Final   Klebsiella oxytoca NOT DETECTED NOT DETECTED Final   Klebsiella pneumoniae NOT DETECTED NOT DETECTED Final   Proteus species NOT DETECTED NOT DETECTED Final   Serratia marcescens NOT DETECTED NOT DETECTED Final   Haemophilus influenzae NOT DETECTED NOT DETECTED Final   Neisseria meningitidis NOT DETECTED NOT DETECTED Final   Pseudomonas aeruginosa NOT DETECTED NOT DETECTED Final   Candida albicans NOT DETECTED NOT DETECTED Final   Candida glabrata NOT DETECTED NOT DETECTED Final   Candida krusei NOT DETECTED NOT DETECTED Final   Candida parapsilosis NOT DETECTED NOT DETECTED Final   Candida tropicalis NOT DETECTED NOT DETECTED Final    Comment: Performed at [facility], [address] [place], [place] [address]  Culture, blood (Routine x 2)     Status: Abnormal (Preliminary result)   Collection Time: 02/13/17  5:00 PM  Result Value Ref Range Status   Specimen Description BLOOD RIGHT ANTECUBITAL  Final   Special  Requests   Final    BOTTLES DRAWN AEROBIC AND ANAEROBIC Blood Culture adequate volume   Culture  Setup Time   Final    GRAM POSITIVE COCCI IN CLUSTERS IN BOTH AEROBIC AND ANAEROBIC BOTTLES CRITICAL VALUE NOTED.  VALUE IS CONSISTENT WITH PREVIOUSLY REPORTED AND CALLED VALUE. Performed at [facility], [address] [place], [place] [address]    Culture STAPHYLOCOCCUS AUREUS (A)  Final   Report Status PENDING  Incomplete  MRSA PCR Screening     Status: None   Collection Time: 02/13/17  9:32 PM  Result Value Ref Range Status   MRSA by PCR NEGATIVE NEGATIVE Final    Comment:  The GeneXpert MRSA Assay (FDA approved for NASAL specimens only), is one component of a comprehensive MRSA colonization surveillance program. It is not intended to diagnose MRSA infection nor to guide or monitor treatment for MRSA infections.   Culture, blood (Routine X 2) w Reflex to ID Panel     Status: None (Preliminary result)   Collection Time: 02/14/17 10:07 AM  Result Value Ref Range Status   Specimen Description BLOOD RIGHT HAND  Final   Special Requests IN PEDIATRIC BOTTLE Blood Culture adequate volume  Final   Culture  Setup Time   Final    GRAM POSITIVE COCCI IN PEDIATRIC BOTTLE CRITICAL VALUE NOTED.  VALUE IS CONSISTENT WITH PREVIOUSLY REPORTED AND CALLED VALUE.    Culture   Final    NO GROWTH < 24 HOURS Performed at [facility], [address], [place], [place] [address]    Report Status PENDING  Incomplete    Studies/Results: Dg Chest 2 View  Result Date: 02/13/2017 CLINICAL DATA:  Pain EXAM: CHEST  2 VIEW COMPARISON:  12/19/2014 FINDINGS: Bibasilar opacities, favor atelectasis. Heart is normal size. No effusions or acute bony abnormality. IMPRESSION: Bibasilar opacities, likely atelectasis. Electronically Signed   By: Charlett Nose M.D.   On: 02/13/2017 18:34   Mr Lumbar Spine W Wo Contrast  Result Date: 02/14/2017 CLINICAL DATA:  Fever and leukocytosis. IV drug  user. Lower extremity weakness. Staph aureus bacteremia. EXAM: MRI LUMBAR SPINE WITHOUT AND WITH CONTRAST TECHNIQUE: Multiplanar and multiecho pulse sequences of the lumbar spine were obtained without and with intravenous contrast. CONTRAST:  15mL MULTIHANCE GADOBENATE DIMEGLUMINE 529 MG/ML IV SOLN COMPARISON:  07/30/2010. FINDINGS: Segmentation:  Standard Alignment:  Physiologic. Vertebrae: Low signal intensity bone marrow on T1 and T2 weighted imaging appears similar to 2012, likely related to anemia or chronic disease. Conus medullaris and cauda equina: Conus extends to the L1 level. Conus and cauda equina appear normal. Paraspinal and other soft tissues: No visible fluid collection or mass. Disc levels: No disc protrusion or spinal stenosis. Slight disc desiccation L5-S1. Annular rent extends to the LEFT. This finding was noted previously. IMPRESSION: Unremarkable lumbar spine MRI. No evidence of diskitis or osteomyelitis. No abnormal postcontrast enhancement of visualized paravertebral soft tissues. Minor disc disease L5-S1, not significantly changed from 2012. Electronically Signed   By: Elsie Stain M.D.   On: 02/14/2017 18:29     Assessment/Plan: MSSA bacteremia (1-31 and 2-1) B LE weakness IVDA, cocaine abuse Protein calorie malnutrition  Total days of antibiotics: 2 (ancef)  MRI lumbar spine (-) TTE- no mention of vegetation. Will need TEE Check urine gc/chlamydia, RPR Consider CT of pelvis if above (-)? Await Hep C  HIV (-) Nutrition eval Could she be detoxing? Repeat BCx in AM         Johny Sax MD, FACP Infectious Diseases (pager) [phone] [email or web address] 02/15/2017, 10:00 AM  LOS: 2 days

## 2017-02-15 NOTE — Progress Notes (Signed)
Patient bathed; linens changed; patient transferred up to bedside chair.  Patient w/chills and tachycardia in the 130s.  Warm blankets provided.  Mom and uncle at the bedside w/patient.

## 2017-02-15 NOTE — Progress Notes (Signed)
Patient w/Temp 103.1.  Page sent to Dr. Sharon SellerMcClung to notify.  Await response.  Patient given Tylenol 650mg  PO per PRN orders.  Resting comfortably in bed.

## 2017-02-15 NOTE — Progress Notes (Signed)
[place] TEAM 1 - Stepdown/ICU TEAM  [name]  ZOX:[id number] DOB: 12-27-1985 DOA: 02/13/2017 PCP: Pearson Grippe, MD    Brief Narrative:  32 y.o. female w/ a hx of fibromyalgia, anemia, and IV drug abuse who presented with a few days history of fever, chills, generalized weakness muscle aches, and arthralgias.  In the emergency room the patient was noted to have fever, leukocytosis and left hand and bilateral lower extremity redness and petechiae.    Significant Events: 1/31 admit  2/1 TTE - EF 60-65% - no WMA - grade 1 DD  Subjective: The patient is somnolent at the time of my visit.  She keeps nodding off during our conversation.  She easily awakens with redirection.  She complains of generalized pelvic pain.  She denies vaginal discharge or dysuria.  She denies chest pain or low back pain.  She states she is "fine" but her mother feels that she is still very unstable on her feet and notes that her legs seem to be weaker than normal.  Assessment & Plan:  Staph aureus bacteremia w/ Severe Sepsis - L hand cellulitis - suspected SBE ID following and directing antibiotic therapy - TTE w/o evidence of vegetations - will need TEE - MRI of lumbar spine unrevealing - denies current back pain or neck pain   +UA Should be more than adequately covered with empiric antibiotic initiated in ED  Hyponatremia  Stable at this time - follow trend   Hypokalemia  Supplement further and follow - suspect she has a signif total body deficit   Normocytic anemia  Likely due to poor nutrition as well as menstrual loss in setting of ongoing IV drug abuse - possible component of hemolysis as well - Hgb dropping in setting of volume expansion - follow trend   Thrombocytopenia  Due to bacteremia and IV drug abuse  IV drug abuse   Patient clarifies she has been using 6 mg Suboxone films at home -dose adjusted  DVT prophylaxis: lovenox  Code Status: FULL CODE Family Communication: spoke w/ mother at  bedside   Disposition Plan: SDU  Consultants:  ID  Antimicrobials:  Zosyn 1/31 > 2/1 Vanc 1/31 > 2/1 Cefazolin 2/1 >  Objective: Blood pressure 92/60, pulse 95, temperature 99.2 F (37.3 C), temperature source Oral, resp. rate (!) 23, height 5\' 7"  (1.702 m), weight 76.5 kg (168 lb 10.4 oz), last menstrual period 01/28/2017, SpO2 95 %.  Intake/Output Summary (Last 24 hours) at 02/15/2017 1119 Last data filed at 02/15/2017 8119 Gross per 24 hour  Intake 180 ml  Output -  Net 180 ml   Filed Weights   02/13/17 2120  Weight: 76.5 kg (168 lb 10.4 oz)     Examination: General: No acute respiratory distress Lungs: Clear to auscultation bilaterally without wheezes or crackles Cardiovascular: Regular rate and rhythm without murmur gallop or rub normal S1 and S2 Abdomen: Nontender, nondistended, soft, bowel sounds positive, no rebound, no ascites, no appreciable mass Extremities: No significant cyanosis, clubbing, or edema bilateral lower extremities  CBC: Recent Labs  Lab 02/13/17 1604 02/13/17 2136 02/15/17 0247  WBC 14.4* 11.0* 15.1*  NEUTROABS 12.4*  --   --   HGB 11.5* 8.7* 7.9*  HCT 33.1* 24.6* 22.2*  MCV 84.4 83.1 84.7  PLT 109* 71* 77*   Basic Metabolic Panel: Recent Labs  Lab 02/13/17 1604 02/13/17 2136 02/14/17 0305 02/15/17 0247  NA 130*  --  134* 133*  K 3.7  --  3.2* 3.1*  CL 88*  --  100* 98*  CO2 27  --  25 24  GLUCOSE 107*  --  95 126*  BUN 9  --  10 8  CREATININE 0.72 1.32* 0.76 0.88  CALCIUM 8.7*  --  7.7* 8.0*  MG  --   --   --  2.0   GFR: Estimated Creatinine Clearance: 97.9 mL/min (by C-G formula based on SCr of 0.88 mg/dL).  Liver Function Tests: Recent Labs  Lab 02/13/17 1604 02/14/17 0305 02/15/17 0247  AST 52* 40 47*  ALT 39 29 27  ALKPHOS 177* 132* 153*  BILITOT 1.9* 2.2* 2.0*  PROT 7.0 5.4* 5.3*  ALBUMIN 2.5* 1.8* 1.6*    Coagulation Profile: Recent Labs  Lab 02/13/17 1604  INR 1.08    Cardiac Enzymes: Recent Labs    Lab 02/13/17 1701 02/13/17 2136 02/14/17 0305  TROPONINI <0.03 <0.03 <0.03    HbA1C: Hgb A1c MFr Bld  Date/Time Value Ref Range Status  02/03/2009 06:59 PM 5.6 4.6 - 6.1 % Final    Comment:    See lab report for associated comment(s)    Recent Results (from the past 240 hour(s))  Culture, blood (Routine x 2)     Status: Abnormal (Preliminary result)   Collection Time: 02/13/17  4:00 PM  Result Value Ref Range Status   Specimen Description BLOOD RIGHT ANTECUBITAL  Final   Special Requests IN PEDIATRIC BOTTLE Blood Culture adequate volume  Final   Culture  Setup Time   Final    GRAM POSITIVE COCCI IN PEDIATRIC BOTTLE CRITICAL RESULT CALLED TO, READ BACK BY AND VERIFIED WITH: [facility] PHARMD [id number] 02/14/17 A BROWNING    Culture (A)  Final    STAPHYLOCOCCUS AUREUS SUSCEPTIBILITIES TO FOLLOW Performed at [facility], [address] [place], [place] [address]    Report Status PENDING  Incomplete  Blood Culture ID Panel (Reflexed)     Status: Abnormal   Collection Time: 02/13/17  4:00 PM  Result Value Ref Range Status   Enterococcus species NOT DETECTED NOT DETECTED Final   Listeria monocytogenes NOT DETECTED NOT DETECTED Final   Staphylococcus species DETECTED (A) NOT DETECTED Final    Comment: CRITICAL RESULT CALLED TO, READ BACK BY AND VERIFIED WITH: J [facility] PHARMD [id number] 02/14/17 A BROWNING    Staphylococcus aureus DETECTED (A) NOT DETECTED Final    Comment: Methicillin (oxacillin) susceptible Staphylococcus aureus (MSSA). Preferred therapy is anti staphylococcal beta lactam antibiotic (Cefazolin or Nafcillin), unless clinically contraindicated. CRITICAL RESULT CALLED TO, READ BACK BY AND VERIFIED WITH: Melven SartoriusJ LEDFORD PHARMD [id number] 02/14/17 A BROWNING    Methicillin resistance NOT DETECTED NOT DETECTED Final   Streptococcus species NOT DETECTED NOT DETECTED Final   Streptococcus agalactiae NOT DETECTED NOT DETECTED Final   Streptococcus pneumoniae NOT DETECTED NOT DETECTED  Final   Streptococcus pyogenes NOT DETECTED NOT DETECTED Final   Acinetobacter baumannii NOT DETECTED NOT DETECTED Final   Enterobacteriaceae species NOT DETECTED NOT DETECTED Final   Enterobacter cloacae complex NOT DETECTED NOT DETECTED Final   Escherichia coli NOT DETECTED NOT DETECTED Final   Klebsiella oxytoca NOT DETECTED NOT DETECTED Final   Klebsiella pneumoniae NOT DETECTED NOT DETECTED Final   Proteus species NOT DETECTED NOT DETECTED Final   Serratia marcescens NOT DETECTED NOT DETECTED Final   Haemophilus influenzae NOT DETECTED NOT DETECTED Final   Neisseria meningitidis NOT DETECTED NOT DETECTED Final   Pseudomonas aeruginosa NOT DETECTED NOT DETECTED Final   Candida albicans NOT DETECTED  NOT DETECTED Final   Candida glabrata NOT DETECTED NOT DETECTED Final   Candida krusei NOT DETECTED NOT DETECTED Final   Candida parapsilosis NOT DETECTED NOT DETECTED Final   Candida tropicalis NOT DETECTED NOT DETECTED Final    Comment: Performed at [facility], [address] [place], [place] [address]  Culture, blood (Routine x 2)     Status: Abnormal (Preliminary result)   Collection Time: 02/13/17  5:00 PM  Result Value Ref Range Status   Specimen Description BLOOD RIGHT ANTECUBITAL  Final   Special Requests   Final    BOTTLES DRAWN AEROBIC AND ANAEROBIC Blood Culture adequate volume   Culture  Setup Time   Final    GRAM POSITIVE COCCI IN CLUSTERS IN BOTH AEROBIC AND ANAEROBIC BOTTLES CRITICAL VALUE NOTED.  VALUE IS CONSISTENT WITH PREVIOUSLY REPORTED AND CALLED VALUE. Performed at [facility], [address], [place], [place] [address]    Culture STAPHYLOCOCCUS AUREUS (A)  Final   Report Status PENDING  Incomplete  MRSA PCR Screening     Status: None   Collection Time: 02/13/17  9:32 PM  Result Value Ref Range Status   MRSA by PCR NEGATIVE NEGATIVE Final    Comment:        The GeneXpert MRSA Assay (FDA approved for NASAL specimens only), is one component of  a comprehensive MRSA colonization surveillance program. It is not intended to diagnose MRSA infection nor to guide or monitor treatment for MRSA infections.   Culture, blood (Routine X 2) w Reflex to ID Panel     Status: None (Preliminary result)   Collection Time: 02/14/17 10:07 AM  Result Value Ref Range Status   Specimen Description BLOOD RIGHT HAND  Final   Special Requests IN PEDIATRIC BOTTLE Blood Culture adequate volume  Final   Culture  Setup Time   Final    GRAM POSITIVE COCCI IN PEDIATRIC BOTTLE CRITICAL VALUE NOTED.  VALUE IS CONSISTENT WITH PREVIOUSLY REPORTED AND CALLED VALUE.    Culture   Final    NO GROWTH < 24 HOURS Performed at [facility], [address] [place], [place] [address]    Report Status PENDING  Incomplete     Scheduled Meds: . buprenorphine  8 mg Sublingual Daily  . DULoxetine  60 mg Oral BID  . enoxaparin (LOVENOX) injection  40 mg Subcutaneous Q24H  . feeding supplement  1 Container Oral TID BM  . gadobenate dimeglumine  15 mL Intravenous Once  . multivitamin with minerals  1 tablet Oral Daily  . OLANZapine  5 mg Oral q morning - 10a  . pneumococcal 23 valent vaccine  0.5 mL Intramuscular Tomorrow-1000     LOS: 2 days   Lonia Blood, MD [facility] Office  [phone] Pager - Text Page per Amion as per below:  On-Call/Text Page:      [email or web address]      password [email or web address]  If 7PM-7AM, please contact night-coverage [email or web address] Password [email or web address] 02/15/2017, 11:19 AM

## 2017-02-15 NOTE — Plan of Care (Signed)
Patient is slowly progressing; increased movement noted in BLEs this AM - patient able to bend her knees and lift feet without assistance.  MRI negative for acute issues to lumbar spine.  Echo negative for vegetation.  Continues to receive IV ATBs.  Will continue to monitor.

## 2017-02-16 DIAGNOSIS — R140 Abdominal distension (gaseous): Secondary | ICD-10-CM

## 2017-02-16 DIAGNOSIS — R102 Pelvic and perineal pain: Secondary | ICD-10-CM | POA: Insufficient documentation

## 2017-02-16 DIAGNOSIS — R52 Pain, unspecified: Secondary | ICD-10-CM

## 2017-02-16 LAB — COMPREHENSIVE METABOLIC PANEL
ALK PHOS: 186 U/L — AB (ref 38–126)
ALT: 22 U/L (ref 14–54)
ANION GAP: 9 (ref 5–15)
AST: 49 U/L — ABNORMAL HIGH (ref 15–41)
Albumin: 1.5 g/dL — ABNORMAL LOW (ref 3.5–5.0)
BILIRUBIN TOTAL: 2.1 mg/dL — AB (ref 0.3–1.2)
BUN: 11 mg/dL (ref 6–20)
CALCIUM: 7.9 mg/dL — AB (ref 8.9–10.3)
CO2: 24 mmol/L (ref 22–32)
Chloride: 103 mmol/L (ref 101–111)
Creatinine, Ser: 0.76 mg/dL (ref 0.44–1.00)
GFR calc Af Amer: >60 mL/min (ref >60)
Glucose, Bld: 92 mg/dL (ref 65–99)
POTASSIUM: 4.5 mmol/L (ref 3.5–5.1)
Sodium: 136 mmol/L (ref 135–145)
TOTAL PROTEIN: 5.1 g/dL — AB (ref 6.5–8.1)

## 2017-02-16 LAB — RPR: RPR Ser Ql: NONREACTIVE

## 2017-02-16 LAB — CULTURE, BLOOD (ROUTINE X 2): Special Requests: ADEQUATE

## 2017-02-16 LAB — CBC
HCT: 21.6 % — ABNORMAL LOW (ref 36.0–46.0)
HEMOGLOBIN: 7.7 g/dL — AB (ref 12.0–15.0)
MCH: 30.2 pg (ref 26.0–34.0)
MCHC: 35.6 g/dL (ref 30.0–36.0)
MCV: 84.7 fL (ref 78.0–100.0)
Platelets: 92 10*3/uL — ABNORMAL LOW (ref 150–400)
RBC: 2.55 MIL/uL — ABNORMAL LOW (ref 3.87–5.11)
RDW: 14.3 % (ref 11.5–15.5)
WBC: 14.3 10*3/uL — AB (ref 4.0–10.5)

## 2017-02-16 MED ORDER — POLYETHYLENE GLYCOL 3350 17 G PO PACK
17.0000 g | PACK | Freq: Every day | ORAL | Status: DC
  Administered 2017-02-16 – 2017-03-06 (×5): 17 g via ORAL
  Filled 2017-02-16 (×37): qty 1

## 2017-02-16 MED ORDER — SULFAMETHOXAZOLE-TRIMETHOPRIM 800-160 MG PO TABS
2.0000 | ORAL_TABLET | Freq: Once | ORAL | Status: AC
  Administered 2017-02-16: 2 via ORAL
  Filled 2017-02-16: qty 2

## 2017-02-16 MED ORDER — BUPRENORPHINE HCL-NALOXONE HCL 2-0.5 MG SL SUBL
2.0000 | SUBLINGUAL_TABLET | Freq: Every day | SUBLINGUAL | Status: DC
  Administered 2017-02-17 – 2017-04-15 (×58): 2 via SUBLINGUAL
  Filled 2017-02-16 (×11): qty 2
  Filled 2017-02-16: qty 1
  Filled 2017-02-16 (×47): qty 2

## 2017-02-16 MED ORDER — ALPRAZOLAM 0.5 MG PO TABS: 1.0000 mg | ORAL_TABLET | Freq: Once | ORAL | Status: DC | PRN

## 2017-02-16 MED ORDER — SENNOSIDES-DOCUSATE SODIUM 8.6-50 MG PO TABS
1.0000 | ORAL_TABLET | Freq: Two times a day (BID) | ORAL | Status: DC
  Administered 2017-02-16 – 2017-04-15 (×96): 1 via ORAL
  Filled 2017-02-16 (×112): qty 1

## 2017-02-16 MED ORDER — SULFAMETHOXAZOLE-TRIMETHOPRIM 800-160 MG PO TABS
2.0000 | ORAL_TABLET | Freq: Two times a day (BID) | ORAL | Status: DC
  Administered 2017-02-17 – 2017-02-18 (×3): 2 via ORAL
  Filled 2017-02-16 (×3): qty 2

## 2017-02-16 NOTE — Progress Notes (Signed)
[place] TEAM 1 - Stepdown/ICU TEAM  [name]  WUJ:[id number] DOB: 12-15-1985 DOA: 02/13/2017 PCP: Pearson Grippe, MD    Brief Narrative:  32 y.o. female w/ a hx of fibromyalgia, anemia, and IV drug abuse who presented with a few days history of fever, chills, generalized weakness muscle aches, and arthralgias.  In the emergency room the patient was noted to have fever, leukocytosis and left hand and bilateral lower extremity redness and petechiae.    Significant Events: 1/31 admit  2/1 TTE - EF 60-65% - no WMA - grade 1 DD  Subjective: The patient is seen in her room without family present this morning.  She denies lower extremity weakness or pain.  She denies mid or upper back pain.  At times during her hospital stay however she has reported the inability to move her legs.  The nursing staff has noted this appears to be an intermittent occurrence.  She currently denies chest pain nausea vomiting or abdominal pain.  She reports that she could not sleep well last night due to intermittent fevers.  Assessment & Plan:  Staph aureus bacteremia w/ Severe Sepsis - L hand cellulitis - suspected SBE ID following and directing antibiotic therapy - TTE w/o evidence of vegetations - will need TEE - MRI of lumbar spine unrevealing - denies current back pain or neck pain   ?intermittent B LE weakness Unclear etiology - will proceed w/ MRI imaging of thoracic and cervical spine in setting of Staph bacteremia to assure no evidence of spinal involvement  Vascular access Pt has lost her peripheral IV and thus far the RN has not been able to obtain another - I wish to avoid PICC as long as possible, but she must get her IV abx - IV Team RN to try peripheral - if this fails, will have no option but to place PICC    +UA Should be more than adequately covered with empiric antibiotic initiated in ED - culture does not appear to have been sent   Hyponatremia  Corrected w/ volume expansion   Hypokalemia    Corrected w/ supplementation   Normocytic anemia  Likely due to poor nutrition as well as menstrual loss in setting of ongoing IV drug abuse - possible component of hemolysis as well - Hgb dropping in setting of volume expansion - cont to follow trend   Thrombocytopenia  Due to bacteremia and IV drug abuse - slowly improving   IV drug abuse   Patient clarifies she has been using 6 mg Suboxone films at home -dose adjusted  DVT prophylaxis: lovenox  Code Status: FULL CODE Family Communication: no family present at time of exam   Disposition Plan: SDU  Consultants:  ID  Antimicrobials:  Zosyn 1/31 > 2/1 Vanc 1/31 > 2/1 Cefazolin 2/1 >  Objective: Blood pressure 95/61, pulse 92, temperature 98.8 F (37.1 C), temperature source Oral, resp. rate (!) 23, height 5\' 7"  (1.702 m), weight 76.5 kg (168 lb 10.4 oz), last menstrual period 01/28/2017, SpO2 93 %.  Intake/Output Summary (Last 24 hours) at 02/16/2017 1054 Last data filed at 02/16/2017 0600 Gross per 24 hour  Intake 967.67 ml  Output 1200 ml  Net -232.33 ml   Filed Weights   02/13/17 2120  Weight: 76.5 kg (168 lb 10.4 oz)     Examination: General: No acute respiratory distress Lungs: CTA B - no wheezing  Cardiovascular: RRR - no M or rub  Abdomen: Nontender, nondistended, soft, bowel sounds positive, no mass  Extremities: trace B LE edema - 4/5 strength B LE   CBC: Recent Labs  Lab 02/13/17 1604 02/13/17 2136 02/15/17 0247 02/16/17 0252  WBC 14.4* 11.0* 15.1* 14.3*  NEUTROABS 12.4*  --   --   --   HGB 11.5* 8.7* 7.9* 7.7*  HCT 33.1* 24.6* 22.2* 21.6*  MCV 84.4 83.1 84.7 84.7  PLT 109* 71* 77* 92*   Basic Metabolic Panel: Recent Labs  Lab 02/13/17 1604 02/13/17 2136 02/14/17 0305 02/15/17 0247 02/16/17 0252  NA 130*  --  134* 133* 136  K 3.7  --  3.2* 3.1* 4.5  CL 88*  --  100* 98* 103  CO2 27  --  25 24 24   GLUCOSE 107*  --  95 126* 92  BUN 9  --  10 8 11   CREATININE 0.72 1.32* 0.76 0.88 0.76   CALCIUM 8.7*  --  7.7* 8.0* 7.9*  MG  --   --   --  2.0  --    GFR: Estimated Creatinine Clearance: 107.7 mL/min (by C-G formula based on SCr of 0.76 mg/dL).  Liver Function Tests: Recent Labs  Lab 02/13/17 1604 02/14/17 0305 02/15/17 0247 02/16/17 0252  AST 52* 40 47* 49*  ALT 39 29 27 22   ALKPHOS 177* 132* 153* 186*  BILITOT 1.9* 2.2* 2.0* 2.1*  PROT 7.0 5.4* 5.3* 5.1*  ALBUMIN 2.5* 1.8* 1.6* 1.5*    Coagulation Profile: Recent Labs  Lab 02/13/17 1604  INR 1.08    Cardiac Enzymes: Recent Labs  Lab 02/13/17 1701 02/13/17 2136 02/14/17 0305  TROPONINI <0.03 <0.03 <0.03    HbA1C: Hgb A1c MFr Bld  Date/Time Value Ref Range Status  02/03/2009 06:59 PM 5.6 4.6 - 6.1 % Final    Comment:    See lab report for associated comment(s)    Recent Results (from the past 240 hour(s))  Culture, blood (Routine x 2)     Status: Abnormal (Preliminary result)   Collection Time: 02/13/17  4:00 PM  Result Value Ref Range Status   Specimen Description BLOOD RIGHT ANTECUBITAL  Final   Special Requests IN PEDIATRIC BOTTLE Blood Culture adequate volume  Final   Culture  Setup Time   Final    GRAM POSITIVE COCCI IN PEDIATRIC BOTTLE CRITICAL RESULT CALLED TO, READ BACK BY AND VERIFIED WITHMelven Sartorius: J [facility] [id number] 02/14/17 A BROWNING Performed at [facility], [address] [place], [place] [address]    Culture STAPHYLOCOCCUS AUREUS (A)  Final   Report Status PENDING  Incomplete   Organism ID, Bacteria STAPHYLOCOCCUS AUREUS  Final      Susceptibility   Staphylococcus aureus - MIC*    CIPROFLOXACIN <=0.5 SENSITIVE Sensitive     ERYTHROMYCIN <=0.25 SENSITIVE Sensitive     GENTAMICIN <=0.5 SENSITIVE Sensitive     OXACILLIN <=0.25 SENSITIVE Sensitive     TETRACYCLINE >=16 RESISTANT Resistant     VANCOMYCIN 1 SENSITIVE Sensitive     TRIMETH/SULFA <=10 SENSITIVE Sensitive     CLINDAMYCIN <=0.25 SENSITIVE Sensitive     RIFAMPIN <=0.5 SENSITIVE Sensitive     Inducible  Clindamycin NEGATIVE Sensitive     * STAPHYLOCOCCUS AUREUS  Blood Culture ID Panel (Reflexed)     Status: Abnormal   Collection Time: 02/13/17  4:00 PM  Result Value Ref Range Status   Enterococcus species NOT DETECTED NOT DETECTED Final   Listeria monocytogenes NOT DETECTED NOT DETECTED Final   Staphylococcus species DETECTED (A) NOT DETECTED Final  Comment: CRITICAL RESULT CALLED TO, READ BACK BY AND VERIFIED WITH: J [facility] PHARMD [id number] 02/14/17 A BROWNING    Staphylococcus aureus DETECTED (A) NOT DETECTED Final    Comment: Methicillin (oxacillin) susceptible Staphylococcus aureus (MSSA). Preferred therapy is anti staphylococcal beta lactam antibiotic (Cefazolin or Nafcillin), unless clinically contraindicated. CRITICAL RESULT CALLED TO, READ BACK BY AND VERIFIED WITH: Melven Sartorius PHARMD [id number] 02/14/17 A BROWNING    Methicillin resistance NOT DETECTED NOT DETECTED Final   Streptococcus species NOT DETECTED NOT DETECTED Final   Streptococcus agalactiae NOT DETECTED NOT DETECTED Final   Streptococcus pneumoniae NOT DETECTED NOT DETECTED Final   Streptococcus pyogenes NOT DETECTED NOT DETECTED Final   Acinetobacter baumannii NOT DETECTED NOT DETECTED Final   Enterobacteriaceae species NOT DETECTED NOT DETECTED Final   Enterobacter cloacae complex NOT DETECTED NOT DETECTED Final   Escherichia coli NOT DETECTED NOT DETECTED Final   Klebsiella oxytoca NOT DETECTED NOT DETECTED Final   Klebsiella pneumoniae NOT DETECTED NOT DETECTED Final   Proteus species NOT DETECTED NOT DETECTED Final   Serratia marcescens NOT DETECTED NOT DETECTED Final   Haemophilus influenzae NOT DETECTED NOT DETECTED Final   Neisseria meningitidis NOT DETECTED NOT DETECTED Final   Pseudomonas aeruginosa NOT DETECTED NOT DETECTED Final   Candida albicans NOT DETECTED NOT DETECTED Final   Candida glabrata NOT DETECTED NOT DETECTED Final   Candida krusei NOT DETECTED NOT DETECTED Final   Candida parapsilosis NOT DETECTED  NOT DETECTED Final   Candida tropicalis NOT DETECTED NOT DETECTED Final    Comment: Performed at [facility], [address], [place], [place] [address]  Culture, blood (Routine x 2)     Status: Abnormal   Collection Time: 02/13/17  5:00 PM  Result Value Ref Range Status   Specimen Description BLOOD RIGHT ANTECUBITAL  Final   Special Requests   Final    BOTTLES DRAWN AEROBIC AND ANAEROBIC Blood Culture adequate volume   Culture  Setup Time   Final    GRAM POSITIVE COCCI IN CLUSTERS IN BOTH AEROBIC AND ANAEROBIC BOTTLES CRITICAL VALUE NOTED.  VALUE IS CONSISTENT WITH PREVIOUSLY REPORTED AND CALLED VALUE.    Culture (A)  Final    STAPHYLOCOCCUS AUREUS SUSCEPTIBILITIES PERFORMED ON PREVIOUS CULTURE WITHIN THE LAST 5 DAYS. Performed at [facility], [address], [place], [place] [address]    Report Status 02/16/2017 FINAL  Final  MRSA PCR Screening     Status: None   Collection Time: 02/13/17  9:32 PM  Result Value Ref Range Status   MRSA by PCR NEGATIVE NEGATIVE Final    Comment:        The GeneXpert MRSA Assay (FDA approved for NASAL specimens only), is one component of a comprehensive MRSA colonization surveillance program. It is not intended to diagnose MRSA infection nor to guide or monitor treatment for MRSA infections.   Culture, blood (Routine X 2) w Reflex to ID Panel     Status: Abnormal (Preliminary result)   Collection Time: 02/14/17 10:07 AM  Result Value Ref Range Status   Specimen Description BLOOD RIGHT HAND  Final   Special Requests IN PEDIATRIC BOTTLE Blood Culture adequate volume  Final   Culture  Setup Time   Final    GRAM POSITIVE COCCI IN PEDIATRIC BOTTLE CRITICAL VALUE NOTED.  VALUE IS CONSISTENT WITH PREVIOUSLY REPORTED AND CALLED VALUE.    Culture (A)  Final    STAPHYLOCOCCUS AUREUS SUSCEPTIBILITIES PERFORMED ON PREVIOUS CULTURE WITHIN THE LAST 5 DAYS.  Performed at [facility], [address] [place], [place] [address]     Report Status PENDING  Incomplete     Scheduled Meds: . buprenorphine  6 mg Sublingual Daily  . DULoxetine  60 mg Oral BID  . enoxaparin (LOVENOX) injection  40 mg Subcutaneous Q24H  . feeding supplement  1 Container Oral TID BM  . gadobenate dimeglumine  15 mL Intravenous Once  . multivitamin with minerals  1 tablet Oral Daily  . OLANZapine  5 mg Oral q morning - 10a  . pneumococcal 23 valent vaccine  0.5 mL Intramuscular Tomorrow-1000     LOS: 3 days   Lonia Blood, MD [facility] Office  [phone] Pager - Text Page per Amion as per below:  On-Call/Text Page:      [email or web address]      password [email or web address]  If 7PM-7AM, please contact night-coverage [email or web address] Password [email or web address] 02/16/2017, 10:54 AM

## 2017-02-16 NOTE — Progress Notes (Signed)
INFECTIOUS DISEASE PROGRESS NOTE  ID: [name] is a 32 y.o. female with  Active Problems:   Sepsis (HCC)  Subjective: fever o/n Continued pain, all over.  Awaiting laxative  Abtx:  Anti-infectives (From admission, onward)   Start     Dose/Rate Route Frequency Ordered Stop   02/14/17 1400  ceFAZolin (ANCEF) IVPB 2g/100 mL premix     2 g 200 mL/hr over 30 Minutes Intravenous Every 8 hours 02/14/17 1013     02/14/17 0200  vancomycin (VANCOCIN) IVPB 750 mg/150 ml premix  Status:  Discontinued     750 mg 150 mL/hr over 60 Minutes Intravenous Every 8 hours 02/13/17 1739 02/14/17 1017   02/14/17 0000  piperacillin-tazobactam (ZOSYN) IVPB 3.375 g  Status:  Discontinued     3.375 g 12.5 mL/hr over 240 Minutes Intravenous Every 8 hours 02/13/17 1739 02/14/17 1013   02/13/17 1715  piperacillin-tazobactam (ZOSYN) IVPB 3.375 g     3.375 g 100 mL/hr over 30 Minutes Intravenous  Once 02/13/17 1701 02/13/17 1824   02/13/17 1715  vancomycin (VANCOCIN) IVPB 1000 mg/200 mL premix  Status:  Discontinued     1,000 mg 200 mL/hr over 60 Minutes Intravenous  Once 02/13/17 1701 02/13/17 1705   02/13/17 1715  vancomycin (VANCOCIN) 1,500 mg in sodium chloride 0.9 % 500 mL IVPB     1,500 mg 250 mL/hr over 120 Minutes Intravenous  Once 02/13/17 1705 02/13/17 2300      Medications:  Scheduled: . buprenorphine  6 mg Sublingual Daily  . DULoxetine  60 mg Oral BID  . enoxaparin (LOVENOX) injection  40 mg Subcutaneous Q24H  . feeding supplement  1 Container Oral TID BM  . gadobenate dimeglumine  15 mL Intravenous Once  . multivitamin with minerals  1 tablet Oral Daily  . OLANZapine  5 mg Oral q morning - 10a  . pneumococcal 23 valent vaccine  0.5 mL Intramuscular Tomorrow-1000    Objective: Vital signs in last 24 hours: Temp:  [98.3 F (36.8 C)-103.1 F (39.5 C)] 98.8 F (37.1 C) (02/03 0722) Pulse Rate:  [92-94] 92 (02/03 0722) Resp:  [23-29] 23 (02/03 0722) BP: (90-105)/(56-66) 95/61  (02/03 0722) SpO2:  [93 %-100 %] 93 % (02/03 0722)   General appearance: alert, cooperative and no distress Resp: clear to auscultation bilaterally Cardio: tachycardia GI: normal findings: bowel sounds normal and soft, non-tender and abnormal findings:  distended and mild distension Extremities: edema none  Lab Results Recent Labs    02/15/17 0247 02/16/17 0252  WBC 15.1* 14.3*  HGB 7.9* 7.7*  HCT 22.2* 21.6*  NA 133* 136  K 3.1* 4.5  CL 98* 103  CO2 24 24  BUN 8 11  CREATININE 0.88 0.76   Liver Panel Recent Labs    02/15/17 0247 02/16/17 0252  PROT 5.3* 5.1*  ALBUMIN 1.6* 1.5*  AST 47* 49*  ALT 27 22  ALKPHOS 153* 186*  BILITOT 2.0* 2.1*   Sedimentation Rate Recent Labs    02/15/17 0247  ESRSEDRATE 122*   C-Reactive Protein Recent Labs    02/15/17 0247  CRP 19.9*    Microbiology: Recent Results (from the past 240 hour(s))  Culture, blood (Routine x 2)     Status: Abnormal (Preliminary result)   Collection Time: 02/13/17  4:00 PM  Result Value Ref Range Status   Specimen Description BLOOD RIGHT ANTECUBITAL  Final   Special Requests IN PEDIATRIC BOTTLE Blood Culture adequate volume  Final   Culture  Setup  Time   Final    GRAM POSITIVE COCCI IN PEDIATRIC BOTTLE CRITICAL RESULT CALLED TO, READ BACK BY AND VERIFIED WITHMelven Sartorius [facility] 1610 02/14/17 A BROWNING Performed at [facility], [address] [place], [place] [address]    Culture STAPHYLOCOCCUS AUREUS (A)  Final   Report Status PENDING  Incomplete   Organism ID, Bacteria STAPHYLOCOCCUS AUREUS  Final      Susceptibility   Staphylococcus aureus - MIC*    CIPROFLOXACIN <=0.5 SENSITIVE Sensitive     ERYTHROMYCIN <=0.25 SENSITIVE Sensitive     GENTAMICIN <=0.5 SENSITIVE Sensitive     OXACILLIN <=0.25 SENSITIVE Sensitive     TETRACYCLINE >=16 RESISTANT Resistant     VANCOMYCIN 1 SENSITIVE Sensitive     TRIMETH/SULFA <=10 SENSITIVE Sensitive     CLINDAMYCIN <=0.25 SENSITIVE Sensitive      RIFAMPIN <=0.5 SENSITIVE Sensitive     Inducible Clindamycin NEGATIVE Sensitive     * STAPHYLOCOCCUS AUREUS  Blood Culture ID Panel (Reflexed)     Status: Abnormal   Collection Time: 02/13/17  4:00 PM  Result Value Ref Range Status   Enterococcus species NOT DETECTED NOT DETECTED Final   Listeria monocytogenes NOT DETECTED NOT DETECTED Final   Staphylococcus species DETECTED (A) NOT DETECTED Final    Comment: CRITICAL RESULT CALLED TO, READ BACK BY AND VERIFIED WITH: [facility] PHARMD [id number] 02/14/17 A BROWNING    Staphylococcus aureus DETECTED (A) NOT DETECTED Final    Comment: Methicillin (oxacillin) susceptible Staphylococcus aureus (MSSA). Preferred therapy is anti staphylococcal beta lactam antibiotic (Cefazolin or Nafcillin), unless clinically contraindicated. CRITICAL RESULT CALLED TO, READ BACK BY AND VERIFIED WITH: Melven Sartorius PHARMD [id number] 02/14/17 A BROWNING    Methicillin resistance NOT DETECTED NOT DETECTED Final   Streptococcus species NOT DETECTED NOT DETECTED Final   Streptococcus agalactiae NOT DETECTED NOT DETECTED Final   Streptococcus pneumoniae NOT DETECTED NOT DETECTED Final   Streptococcus pyogenes NOT DETECTED NOT DETECTED Final   Acinetobacter baumannii NOT DETECTED NOT DETECTED Final   Enterobacteriaceae species NOT DETECTED NOT DETECTED Final   Enterobacter cloacae complex NOT DETECTED NOT DETECTED Final   Escherichia coli NOT DETECTED NOT DETECTED Final   Klebsiella oxytoca NOT DETECTED NOT DETECTED Final   Klebsiella pneumoniae NOT DETECTED NOT DETECTED Final   Proteus species NOT DETECTED NOT DETECTED Final   Serratia marcescens NOT DETECTED NOT DETECTED Final   Haemophilus influenzae NOT DETECTED NOT DETECTED Final   Neisseria meningitidis NOT DETECTED NOT DETECTED Final   Pseudomonas aeruginosa NOT DETECTED NOT DETECTED Final   Candida albicans NOT DETECTED NOT DETECTED Final   Candida glabrata NOT DETECTED NOT DETECTED Final   Candida krusei NOT DETECTED NOT  DETECTED Final   Candida parapsilosis NOT DETECTED NOT DETECTED Final   Candida tropicalis NOT DETECTED NOT DETECTED Final    Comment: Performed at [facility], [address], [place], [place] [address]  Culture, blood (Routine x 2)     Status: Abnormal   Collection Time: 02/13/17  5:00 PM  Result Value Ref Range Status   Specimen Description BLOOD RIGHT ANTECUBITAL  Final   Special Requests   Final    BOTTLES DRAWN AEROBIC AND ANAEROBIC Blood Culture adequate volume   Culture  Setup Time   Final    GRAM POSITIVE COCCI IN CLUSTERS IN BOTH AEROBIC AND ANAEROBIC BOTTLES CRITICAL VALUE NOTED.  VALUE IS CONSISTENT WITH PREVIOUSLY REPORTED AND CALLED VALUE.    Culture (A)  Final  STAPHYLOCOCCUS AUREUS SUSCEPTIBILITIES PERFORMED ON PREVIOUS CULTURE WITHIN THE LAST 5 DAYS. Performed at [facility], [address] [place], [place] [address]    Report Status 02/16/2017 FINAL  Final  MRSA PCR Screening     Status: None   Collection Time: 02/13/17  9:32 PM  Result Value Ref Range Status   MRSA by PCR NEGATIVE NEGATIVE Final    Comment:        The GeneXpert MRSA Assay (FDA approved for NASAL specimens only), is one component of a comprehensive MRSA colonization surveillance program. It is not intended to diagnose MRSA infection nor to guide or monitor treatment for MRSA infections.   Culture, blood (Routine X 2) w Reflex to ID Panel     Status: Abnormal (Preliminary result)   Collection Time: 02/14/17 10:07 AM  Result Value Ref Range Status   Specimen Description BLOOD RIGHT HAND  Final   Special Requests IN PEDIATRIC BOTTLE Blood Culture adequate volume  Final   Culture  Setup Time   Final    GRAM POSITIVE COCCI IN PEDIATRIC BOTTLE CRITICAL VALUE NOTED.  VALUE IS CONSISTENT WITH PREVIOUSLY REPORTED AND CALLED VALUE.    Culture (A)  Final    STAPHYLOCOCCUS AUREUS SUSCEPTIBILITIES PERFORMED ON PREVIOUS CULTURE WITHIN THE LAST 5 DAYS. Performed at [facility], [address], [place], [place] [address]    Report Status PENDING  Incomplete    Studies/Results: Mr Lumbar Spine W Wo Contrast  Result Date: 02/14/2017 CLINICAL DATA:  Fever and leukocytosis. IV drug user. Lower extremity weakness. Staph aureus bacteremia. EXAM: MRI LUMBAR SPINE WITHOUT AND WITH CONTRAST TECHNIQUE: Multiplanar and multiecho pulse sequences of the lumbar spine were obtained without and with intravenous contrast. CONTRAST:  15mL MULTIHANCE GADOBENATE DIMEGLUMINE 529 MG/ML IV SOLN COMPARISON:  07/30/2010. FINDINGS: Segmentation:  Standard Alignment:  Physiologic. Vertebrae: Low signal intensity bone marrow on T1 and T2 weighted imaging appears similar to 2012, likely related to anemia or chronic disease. Conus medullaris and cauda equina: Conus extends to the L1 level. Conus and cauda equina appear normal. Paraspinal and other soft tissues: No visible fluid collection or mass. Disc levels: No disc protrusion or spinal stenosis. Slight disc desiccation L5-S1. Annular rent extends to the LEFT. This finding was noted previously. IMPRESSION: Unremarkable lumbar spine MRI. No evidence of diskitis or osteomyelitis. No abnormal postcontrast enhancement of visualized paravertebral soft tissues. Minor disc disease L5-S1, not significantly changed from 2012. Electronically Signed   By: Elsie Stain M.D.   On: 02/14/2017 18:29   US Pelvic Complete With Transvaginal  Result Date: 02/15/2017 CLINICAL DATA:  Pelvic pain.  Pain for 1 week. EXAM: TRANSABDOMINAL AND TRANSVAGINAL ULTRASOUND OF PELVIS TECHNIQUE: Both transabdominal and transvaginal ultrasound examinations of the pelvis were performed. Transabdominal technique was performed for global imaging of the pelvis including uterus, ovaries, adnexal regions, and pelvic cul-de-sac. It was necessary to proceed with endovaginal exam following the transabdominal exam to visualize the endometrium and ovaries. COMPARISON:  None FINDINGS: Uterus  Measurements: 8.9 x 4.2 x 5.1 cm. No fibroids or other mass visualized. Endometrium Thickness: 5 mm.  No focal abnormality visualized. Right ovary Measurements: 2.2 x 2.2 x 1.5 cm. Normal appearance/no adnexal mass. Left ovary Not visualized. Other findings No abnormal free fluid. IMPRESSION: 1. Nonvisualized left ovary. 2. Otherwise normal pelvic ultrasound. Electronically Signed   By: Elige Ko   On: 02/15/2017 16:25     Assessment/Plan: MSSA bacteremia (1-31 and 2-1) B LE weakness IVDA, cocaine  abuse Protein calorie malnutrition  Total days of antibiotics: 3 (ancef)  MRI lumbar spine (-) TTE- no mention of vegetation.  Will need TEE Urine gc/chlamydiapending  RPR (-) Pelvic u/s (-) Await Hep C  HIV (-) Nutrition eval Repeat BCx sent today          Tammy SaxJeffrey Britini Garcilazo MD, FACP Infectious Diseases (pager) [phone] [email or web address] 02/16/2017, 11:54 AM  LOS: 3 days

## 2017-02-17 ENCOUNTER — Inpatient Hospital Stay (HOSPITAL_COMMUNITY): Payer: Medicaid Other

## 2017-02-17 DIAGNOSIS — L03114 Cellulitis of left upper limb: Secondary | ICD-10-CM | POA: Diagnosis present

## 2017-02-17 DIAGNOSIS — M898X1 Other specified disorders of bone, shoulder: Secondary | ICD-10-CM | POA: Insufficient documentation

## 2017-02-17 DIAGNOSIS — R768 Other specified abnormal immunological findings in serum: Secondary | ICD-10-CM | POA: Diagnosis present

## 2017-02-17 DIAGNOSIS — R29898 Other symptoms and signs involving the musculoskeletal system: Secondary | ICD-10-CM | POA: Diagnosis present

## 2017-02-17 DIAGNOSIS — A4101 Sepsis due to Methicillin susceptible Staphylococcus aureus: Secondary | ICD-10-CM | POA: Diagnosis present

## 2017-02-17 DIAGNOSIS — M25519 Pain in unspecified shoulder: Secondary | ICD-10-CM

## 2017-02-17 DIAGNOSIS — M79642 Pain in left hand: Secondary | ICD-10-CM

## 2017-02-17 DIAGNOSIS — B1920 Unspecified viral hepatitis C without hepatic coma: Secondary | ICD-10-CM

## 2017-02-17 DIAGNOSIS — F1990 Other psychoactive substance use, unspecified, uncomplicated: Secondary | ICD-10-CM

## 2017-02-17 LAB — CBC
HEMATOCRIT: 20.8 % — AB (ref 36.0–46.0)
HEMOGLOBIN: 7.4 g/dL — AB (ref 12.0–15.0)
MCH: 29.8 pg (ref 26.0–34.0)
MCHC: 35.6 g/dL (ref 30.0–36.0)
MCV: 83.9 fL (ref 78.0–100.0)
Platelets: 174 10*3/uL (ref 150–400)
RBC: 2.48 MIL/uL — ABNORMAL LOW (ref 3.87–5.11)
RDW: 14.3 % (ref 11.5–15.5)
WBC: 14.4 10*3/uL — AB (ref 4.0–10.5)

## 2017-02-17 LAB — IRON AND TIBC
Iron: 14 ug/dL — ABNORMAL LOW (ref 28–170)
Saturation Ratios: 9 % — ABNORMAL LOW (ref 10.4–31.8)
TIBC: 158 ug/dL — AB (ref 250–450)
UIBC: 144 ug/dL

## 2017-02-17 LAB — BASIC METABOLIC PANEL
ANION GAP: 14 (ref 5–15)
BUN: 9 mg/dL (ref 6–20)
CHLORIDE: 100 mmol/L — AB (ref 101–111)
CO2: 20 mmol/L — AB (ref 22–32)
Calcium: 8 mg/dL — ABNORMAL LOW (ref 8.9–10.3)
Creatinine, Ser: 0.96 mg/dL (ref 0.44–1.00)
GFR calc Af Amer: >60 mL/min (ref >60)
GFR calc non Af Amer: >60 mL/min (ref >60)
Glucose, Bld: 121 mg/dL — ABNORMAL HIGH (ref 65–99)
Potassium: 5 mmol/L (ref 3.5–5.1)
Sodium: 134 mmol/L — ABNORMAL LOW (ref 135–145)

## 2017-02-17 LAB — RETICULOCYTES
RBC.: 2.48 MIL/uL — ABNORMAL LOW (ref 3.87–5.11)
Retic Count, Absolute: 14.9 10*3/uL — ABNORMAL LOW (ref 19.0–186.0)
Retic Ct Pct: 0.6 % (ref 0.4–3.1)

## 2017-02-17 LAB — CULTURE, BLOOD (ROUTINE X 2)
SPECIAL REQUESTS: ADEQUATE
Special Requests: ADEQUATE

## 2017-02-17 LAB — GC/CHLAMYDIA PROBE AMP (~~LOC~~) NOT AT ARMC
Chlamydia: NEGATIVE
NEISSERIA GONORRHEA: NEGATIVE

## 2017-02-17 LAB — VITAMIN B12: Vitamin B-12: 376 pg/mL (ref 180–914)

## 2017-02-17 LAB — HEPATITIS B SURFACE ANTIGEN: Hepatitis B Surface Ag: NEGATIVE

## 2017-02-17 LAB — FOLATE: Folate: 8.9 ng/mL (ref >5.9)

## 2017-02-17 LAB — HEPATITIS PANEL, ACUTE
HCV Ab: <0.1 s/co ratio (ref 0.0–0.9)
HEP B S AG: NEGATIVE
Hep A IgM: NEGATIVE
Hep B C IgM: NEGATIVE

## 2017-02-17 LAB — FERRITIN: FERRITIN: 508 ng/mL — AB (ref 11–307)

## 2017-02-17 NOTE — Progress Notes (Signed)
Rehab Admissions Coordinator Note:  Patient was screened by Clois DupesBoyette, Solita Macadam Godwin for appropriateness for an Inpatient Acute Rehab Consult per PT recommendation.  At this time, we are recommending await further medical workup before determining rehab venue options. If pt will need prolonged IV antibiotics, she will need SNF due to her history of IVDA. Marland Kitchen.  Clois DupesBoyette, Lazar Tierce Godwin 02/17/2017, 9:48 AM  I can be reached at [phone].

## 2017-02-17 NOTE — Evaluation (Signed)
Physical Therapy Evaluation Patient Details Name: [name] MRN: [id number] DOB: 09-10-1985 Today's Date: 02/17/2017   History of Present Illness  Pt is a 32 y.o. female, with past medical history significant for fibromyalgia, anemia and history of IV drug abuse. She presented to the ED with a few days history of fever, chills, and generalized weakness muscle aches in addition to arthralgias.  She was admitted with diagnosis of sepsis.     Clinical Impression  Pt admitted with above diagnosis. Pt currently with functional limitations due to the deficits listed below (see PT Problem List). PTA pt lived at home with her mom and stepdad. She was independent with all functional mobility. On eval, pt required min assist bed mobility, min assist transfers, and min guard assist ambulation 3 feet with RW. Gait distance limited by pain and weakness. Max HR 121 during mobility. Pt will benefit from skilled PT to increase their independence and safety with mobility to allow discharge to the venue listed below.       Follow Up Recommendations CIR    Equipment Recommendations  Rolling walker with 5" wheels    Recommendations for Other Services Rehab consult     Precautions / Restrictions Precautions Precautions: Fall      Mobility  Bed Mobility Overal bed mobility: Needs Assistance Bed Mobility: Supine to Sit     Supine to sit: Min assist;HOB elevated     General bed mobility comments: +rail, increased time and effort, assist to elevate trunk  Transfers Overall transfer level: Needs assistance Equipment used: Rolling walker (2 wheeled) Transfers: Sit to/from [employer] Sit to Stand: Min assist Stand pivot transfers: Min assist       General transfer comment: increased time to complete, assist to power up  Ambulation/Gait Ambulation/Gait assistance: Min guard Ambulation Distance (Feet): 3 Feet Assistive device: Rolling walker (2 wheeled) Gait  Pattern/deviations: Step-through pattern;Decreased stride length Gait velocity: decreased Gait velocity interpretation: Below normal speed for age/gender General Gait Details: slow, guarded steps; heavy reliance on RW; max HR 121 during mobility  Stairs            Wheelchair Mobility    Modified Rankin (Stroke Patients Only)       Balance Overall balance assessment: Needs assistance Sitting-balance support: No upper extremity supported;Feet supported Sitting balance-Leahy Scale: Good     Standing balance support: Bilateral upper extremity supported;During functional activity Standing balance-Leahy Scale: Poor Standing balance comment: heavy reliance on RW                             Pertinent Vitals/Pain Pain Assessment: Faces Faces Pain Scale: Hurts whole lot Pain Location: R shoulder and R groin with mobility Pain Descriptors / Indicators: Sore;Guarding;Grimacing;Moaning Pain Intervention(s): Monitored during session;Limited activity within patient's tolerance;Repositioned    Home Living Family/patient expects to be discharged to:: Private residence Living Arrangements: Parent Available Help at Discharge: Family;Available PRN/intermittently Type of Home: House Home Access: Stairs to enter Entrance Stairs-Rails: None Entrance Stairs-Number of Steps: 2 Home Layout: Two level;Able to live on main level with bedroom/bathroom Home Equipment: Hand held shower head      Prior Function Level of Independence: Independent               Hand Dominance   Dominant Hand: Right    Extremity/Trunk Assessment   Upper Extremity Assessment Upper Extremity Assessment: Defer to OT evaluation    Lower Extremity Assessment Lower Extremity Assessment: Generalized  weakness    Cervical / Trunk Assessment Cervical / Trunk Assessment: Normal  Communication   Communication: No difficulties  Cognition Arousal/Alertness: Awake/alert Behavior During Therapy:  WFL for tasks assessed/performed Overall Cognitive Status: Within Functional Limits for tasks assessed                                        General Comments      Exercises     Assessment/Plan    PT Assessment Patient needs continued PT services  PT Problem List Decreased strength;Decreased mobility;Decreased activity tolerance;Decreased balance;Decreased knowledge of use of DME;Pain       PT Treatment Interventions DME instruction;Therapeutic activities;Gait training;Therapeutic exercise;Patient/family education;Balance training;Stair training;Functional mobility training    PT Goals (Current goals can be found in the Care Plan section)  Acute Rehab PT Goals Patient Stated Goal: get better PT Goal Formulation: With patient Time For Goal Achievement: 03/03/17 Potential to Achieve Goals: Good    Frequency Min 3X/week   Barriers to discharge        Co-evaluation               AM-PAC PT "6 Clicks" Daily Activity  Outcome Measure Difficulty turning over in bed (including adjusting bedclothes, sheets and blankets)?: A Lot Difficulty moving from lying on back to sitting on the side of the bed? : A Lot Difficulty sitting down on and standing up from a chair with arms (e.g., wheelchair, bedside commode, etc,.)?: A Lot Help needed moving to and from a bed to chair (including a wheelchair)?: A Little Help needed walking in hospital room?: A Little Help needed climbing 3-5 steps with a railing? : A Lot 6 Click Score: 14    End of Session Equipment Utilized During Treatment: Gait belt Activity Tolerance: Patient tolerated treatment well Patient left: in chair;with call bell/phone within reach Nurse Communication: Mobility status PT Visit Diagnosis: Muscle weakness (generalized) (M62.81);Difficulty in walking, not elsewhere classified (R26.2);Pain Pain - Right/Left: Right Pain - part of body: Hip;Shoulder    Time: 0902-0928 PT Time Calculation (min)  (ACUTE ONLY): 26 min   Charges:   PT Evaluation $PT Eval Moderate Complexity: 1 Mod PT Treatments $Therapeutic Activity: 8-22 mins   PT G Codes:        Aida RaiderWendy Jovin Fester, PT  Office # [phone] Pager [phone]   Ilda FoilGarrow, Tamecia Mcdougald Rene 02/17/2017, 9:36 AM

## 2017-02-17 NOTE — Progress Notes (Signed)
[facility] TEAM 1 - Stepdown/ICU TEAM  [name]  JYN:[id number] DOB: 04/01/1985 DOA: 02/13/2017 PCP: Pearson GrippeKim, James, MD    Brief Narrative:  32 y.o. female w/ a hx of fibromyalgia, anemia, and IV drug abuse who presented with a few days history of fever, chills, generalized weakness muscle aches, and arthralgias.  In the emergency room the patient was noted to have fever, leukocytosis and left hand and bilateral lower extremity redness and petechiae.    Significant Events: 1/31 admit  2/1 TTE - EF 60-65% - no WMA - grade 1 DD  Subjective: The patient is resting comfortably in bed.  She tells me she has been up in a chair for most of the morning.  She denies any pain at the present time.  She is more alert today.  The nurses found evidence yesterday suggesting that the patient may have been taking Suboxone sublingual films in addition to the dissolving tablets we were administering in the hospital.  As a result I have lowered her dose of tablets and she has been counseled to avoid this dangerous behavior.  She is able to move both of her feet and her legs today without any apparent difficulty.  Assessment & Plan:  Staph aureus bacteremia w/ Severe Sepsis - L hand cellulitis - suspected SBE ID following and directing antibiotic therapy - switched to high dose oral bactrim yesterday due to loss of IV access - TTE w/o evidence of vegetations - will need TEE - MRI of lumbar spine unrevealing - denies current back pain or neck pain - discussed IV access w/ ID who prefers we avoid PICC until cleared blood cultures are confirmed   ?intermittent B LE weakness Unclear etiology - MRI imaging of thoracic, cervical, and lumbar spine all unremarkable - exam w/o evidence of persisting sx this AM   Vascular access Pt has lost her peripheral IV and thus far the RN has not been able to obtain another - as discussed above will cont high dose oral abx and follow w/o IV as per ID at this time   +UA Should be  more than adequately covered with empiric antibiotic initiated in ED - culture does not appear to have been sent   Hyponatremia  Corrected w/ volume expansion   Hypokalemia  Corrected w/ supplementation   Normocytic anemia  Likely due to poor nutrition as well as menstrual loss in setting of ongoing IV drug abuse - possible component of hemolysis as well - Hgb dropping in setting of volume expansion - Fe studies c/w "anemia of chronic disease/poor nutrition"  Thrombocytopenia  Due to bacteremia and IV drug abuse - continues to improve   IV drug abuse   Patient clarifies she has been using 6 mg Suboxone films at home - dose adjusted  DVT prophylaxis: lovenox  Code Status: FULL CODE Family Communication: no family present at time of exam   Disposition Plan: SDU  Consultants:  ID  Antimicrobials:  Zosyn 1/31 > 2/1 Vanc 1/31 > 2/1 Cefazolin 2/1 > 2/2 Bactrim 2/3 >  Objective: Blood pressure 96/72, pulse (!) 111, temperature 100 F (37.8 C), temperature source Oral, resp. rate (!) 28, height 5\' 7"  (1.702 m), weight 76.5 kg (168 lb 10.4 oz), last menstrual period 01/28/2017, SpO2 93 %.  Intake/Output Summary (Last 24 hours) at 02/17/2017 1024 Last data filed at 02/17/2017 0708 Gross per 24 hour  Intake 360 ml  Output 400 ml  Net -40 ml   Filed Weights   02/13/17  2120  Weight: 76.5 kg (168 lb 10.4 oz)     Examination: General: No acute respiratory distress - alert and conversant  Lungs: CTA B w/o wheezing or crackles  Cardiovascular: RRR w/o M  Abdomen: NT/ND, soft, bowel sounds positive, no mass Extremities: trace B LE edema - 4+/5 strength B LE   CBC: Recent Labs  Lab 02/13/17 1604 02/13/17 2136 02/15/17 0247 02/16/17 0252 02/17/17 0714  WBC 14.4* 11.0* 15.1* 14.3* 14.4*  NEUTROABS 12.4*  --   --   --   --   HGB 11.5* 8.7* 7.9* 7.7* 7.4*  HCT 33.1* 24.6* 22.2* 21.6* 20.8*  MCV 84.4 83.1 84.7 84.7 83.9  PLT 109* 71* 77* 92* 174   Basic Metabolic  Panel: Recent Labs  Lab 02/13/17 1604 02/13/17 2136 02/14/17 0305 02/15/17 0247 02/16/17 0252 02/17/17 0714  NA 130*  --  134* 133* 136 134*  K 3.7  --  3.2* 3.1* 4.5 5.0  CL 88*  --  100* 98* 103 100*  CO2 27  --  25 24 24  20*  GLUCOSE 107*  --  95 126* 92 121*  BUN 9  --  10 8 11 9   CREATININE 0.72 1.32* 0.76 0.88 0.76 0.96  CALCIUM 8.7*  --  7.7* 8.0* 7.9* 8.0*  MG  --   --   --  2.0  --   --    GFR: Estimated Creatinine Clearance: 89.8 mL/min (by C-G formula based on SCr of 0.96 mg/dL).  Liver Function Tests: Recent Labs  Lab 02/13/17 1604 02/14/17 0305 02/15/17 0247 02/16/17 0252  AST 52* 40 47* 49*  ALT 39 29 27 22   ALKPHOS 177* 132* 153* 186*  BILITOT 1.9* 2.2* 2.0* 2.1*  PROT 7.0 5.4* 5.3* 5.1*  ALBUMIN 2.5* 1.8* 1.6* 1.5*    Coagulation Profile: Recent Labs  Lab 02/13/17 1604  INR 1.08    Cardiac Enzymes: Recent Labs  Lab 02/13/17 1701 02/13/17 2136 02/14/17 0305  TROPONINI <0.03 <0.03 <0.03    HbA1C: Hgb A1c MFr Bld  Date/Time Value Ref Range Status  02/03/2009 06:59 PM 5.6 4.6 - 6.1 % Final    Comment:    See lab report for associated comment(s)    Recent Results (from the past 240 hour(s))  Culture, blood (Routine x 2)     Status: Abnormal   Collection Time: 02/13/17  4:00 PM  Result Value Ref Range Status   Specimen Description BLOOD RIGHT ANTECUBITAL  Final   Special Requests IN PEDIATRIC BOTTLE Blood Culture adequate volume  Final   Culture  Setup Time   Final    GRAM POSITIVE COCCI IN PEDIATRIC BOTTLE CRITICAL RESULT CALLED TO, READ BACK BY AND VERIFIED WITHMelven Sartorius [facility] 1610 02/14/17 A BROWNING Performed at [facility], [address] [place], [place] [address]    Culture STAPHYLOCOCCUS AUREUS (A)  Final   Report Status 02/17/2017 FINAL  Final   Organism ID, Bacteria STAPHYLOCOCCUS AUREUS  Final      Susceptibility   Staphylococcus aureus - MIC*    CIPROFLOXACIN <=0.5 SENSITIVE Sensitive     ERYTHROMYCIN <=0.25  SENSITIVE Sensitive     GENTAMICIN <=0.5 SENSITIVE Sensitive     OXACILLIN <=0.25 SENSITIVE Sensitive     TETRACYCLINE >=16 RESISTANT Resistant     VANCOMYCIN 1 SENSITIVE Sensitive     TRIMETH/SULFA <=10 SENSITIVE Sensitive     CLINDAMYCIN <=0.25 SENSITIVE Sensitive     RIFAMPIN <=0.5 SENSITIVE Sensitive  Inducible Clindamycin NEGATIVE Sensitive     * STAPHYLOCOCCUS AUREUS  Blood Culture ID Panel (Reflexed)     Status: Abnormal   Collection Time: 02/13/17  4:00 PM  Result Value Ref Range Status   Enterococcus species NOT DETECTED NOT DETECTED Final   Listeria monocytogenes NOT DETECTED NOT DETECTED Final   Staphylococcus species DETECTED (A) NOT DETECTED Final    Comment: CRITICAL RESULT CALLED TO, READ BACK BY AND VERIFIED WITH: [facility] PHARMD 1610 02/14/17 A BROWNING    Staphylococcus aureus DETECTED (A) NOT DETECTED Final    Comment: Methicillin (oxacillin) susceptible Staphylococcus aureus (MSSA). Preferred therapy is anti staphylococcal beta lactam antibiotic (Cefazolin or Nafcillin), unless clinically contraindicated. CRITICAL RESULT CALLED TO, READ BACK BY AND VERIFIED WITH: Melven Sartorius PHARMD [id number] 02/14/17 A BROWNING    Methicillin resistance NOT DETECTED NOT DETECTED Final   Streptococcus species NOT DETECTED NOT DETECTED Final   Streptococcus agalactiae NOT DETECTED NOT DETECTED Final   Streptococcus pneumoniae NOT DETECTED NOT DETECTED Final   Streptococcus pyogenes NOT DETECTED NOT DETECTED Final   Acinetobacter baumannii NOT DETECTED NOT DETECTED Final   Enterobacteriaceae species NOT DETECTED NOT DETECTED Final   Enterobacter cloacae complex NOT DETECTED NOT DETECTED Final   Escherichia coli NOT DETECTED NOT DETECTED Final   Klebsiella oxytoca NOT DETECTED NOT DETECTED Final   Klebsiella pneumoniae NOT DETECTED NOT DETECTED Final   Proteus species NOT DETECTED NOT DETECTED Final   Serratia marcescens NOT DETECTED NOT DETECTED Final   Haemophilus influenzae NOT  DETECTED NOT DETECTED Final   Neisseria meningitidis NOT DETECTED NOT DETECTED Final   Pseudomonas aeruginosa NOT DETECTED NOT DETECTED Final   Candida albicans NOT DETECTED NOT DETECTED Final   Candida glabrata NOT DETECTED NOT DETECTED Final   Candida krusei NOT DETECTED NOT DETECTED Final   Candida parapsilosis NOT DETECTED NOT DETECTED Final   Candida tropicalis NOT DETECTED NOT DETECTED Final    Comment: Performed at [facility], [address], [place], [place] [address]  Culture, blood (Routine x 2)     Status: Abnormal   Collection Time: 02/13/17  5:00 PM  Result Value Ref Range Status   Specimen Description BLOOD RIGHT ANTECUBITAL  Final   Special Requests   Final    BOTTLES DRAWN AEROBIC AND ANAEROBIC Blood Culture adequate volume   Culture  Setup Time   Final    GRAM POSITIVE COCCI IN CLUSTERS IN BOTH AEROBIC AND ANAEROBIC BOTTLES CRITICAL VALUE NOTED.  VALUE IS CONSISTENT WITH PREVIOUSLY REPORTED AND CALLED VALUE.    Culture (A)  Final    STAPHYLOCOCCUS AUREUS SUSCEPTIBILITIES PERFORMED ON PREVIOUS CULTURE WITHIN THE LAST 5 DAYS. Performed at [facility], [address], [place], [place] [address]    Report Status 02/16/2017 FINAL  Final  MRSA PCR Screening     Status: None   Collection Time: 02/13/17  9:32 PM  Result Value Ref Range Status   MRSA by PCR NEGATIVE NEGATIVE Final    Comment:        The GeneXpert MRSA Assay (FDA approved for NASAL specimens only), is one component of a comprehensive MRSA colonization surveillance program. It is not intended to diagnose MRSA infection nor to guide or monitor treatment for MRSA infections.   Culture, blood (Routine X 2) w Reflex to ID Panel     Status: Abnormal   Collection Time: 02/14/17 10:07 AM  Result Value Ref Range Status   Specimen Description BLOOD RIGHT HAND  Final  Special Requests IN PEDIATRIC BOTTLE Blood Culture adequate volume  Final   Culture  Setup Time   Final    GRAM POSITIVE  COCCI IN PEDIATRIC BOTTLE CRITICAL VALUE NOTED.  VALUE IS CONSISTENT WITH PREVIOUSLY REPORTED AND CALLED VALUE.    Culture (A)  Final    STAPHYLOCOCCUS AUREUS SUSCEPTIBILITIES PERFORMED ON PREVIOUS CULTURE WITHIN THE LAST 5 DAYS. Performed at [facility], [address] [place], [place] [address]    Report Status 02/17/2017 FINAL  Final     Scheduled Meds: . buprenorphine-naloxone  2 tablet Sublingual Daily  . DULoxetine  60 mg Oral BID  . enoxaparin (LOVENOX) injection  40 mg Subcutaneous Q24H  . feeding supplement  1 Container Oral TID BM  . gadobenate dimeglumine  15 mL Intravenous Once  . multivitamin with minerals  1 tablet Oral Daily  . OLANZapine  5 mg Oral q morning - 10a  . pneumococcal 23 valent vaccine  0.5 mL Intramuscular Tomorrow-1000  . polyethylene glycol  17 g Oral Daily  . senna-docusate  1 tablet Oral BID  . sulfamethoxazole-trimethoprim  2 tablet Oral Q12H     LOS: 4 days   Lonia Blood, MD [facility] Office  [phone] Pager - Text Page per Amion as per below:  On-Call/Text Page:      [email or web address]      password [email or web address]  If 7PM-7AM, please contact night-coverage [email or web address] Password [facility] 02/17/2017, 10:24 AM

## 2017-02-17 NOTE — Progress Notes (Signed)
Subjective:  She does have pain in her clavicle when she tries to push down on objects   Antibiotics:  Anti-infectives (From admission, onward)   Start     Dose/Rate Route Frequency Ordered Stop   02/17/17 0800  sulfamethoxazole-trimethoprim (BACTRIM DS,SEPTRA DS) 800-160 MG per tablet 2 tablet     2 tablet Oral Every 12 hours 02/16/17 1453     02/16/17 1600  sulfamethoxazole-trimethoprim (BACTRIM DS,SEPTRA DS) 800-160 MG per tablet 2 tablet     2 tablet Oral  Once 02/16/17 1509 02/16/17 1657   02/14/17 1400  ceFAZolin (ANCEF) IVPB 2g/100 mL premix  Status:  Discontinued     2 g 200 mL/hr over 30 Minutes Intravenous Every 8 hours 02/14/17 1013 02/16/17 1453   02/14/17 0200  vancomycin (VANCOCIN) IVPB 750 mg/150 ml premix  Status:  Discontinued     750 mg 150 mL/hr over 60 Minutes Intravenous Every 8 hours 02/13/17 1739 02/14/17 1017   02/14/17 0000  piperacillin-tazobactam (ZOSYN) IVPB 3.375 g  Status:  Discontinued     3.375 g 12.5 mL/hr over 240 Minutes Intravenous Every 8 hours 02/13/17 1739 02/14/17 1013   02/13/17 1715  piperacillin-tazobactam (ZOSYN) IVPB 3.375 g     3.375 g 100 mL/hr over 30 Minutes Intravenous  Once 02/13/17 1701 02/13/17 1824   02/13/17 1715  vancomycin (VANCOCIN) IVPB 1000 mg/200 mL premix  Status:  Discontinued     1,000 mg 200 mL/hr over 60 Minutes Intravenous  Once 02/13/17 1701 02/13/17 1705   02/13/17 1715  vancomycin (VANCOCIN) 1,500 mg in sodium chloride 0.9 % 500 mL IVPB     1,500 mg 250 mL/hr over 120 Minutes Intravenous  Once 02/13/17 1705 02/13/17 2300      Medications: Scheduled Meds: . buprenorphine-naloxone  2 tablet Sublingual Daily  . DULoxetine  60 mg Oral BID  . enoxaparin (LOVENOX) injection  40 mg Subcutaneous Q24H  . feeding supplement  1 Container Oral TID BM  . multivitamin with minerals  1 tablet Oral Daily  . OLANZapine  5 mg Oral q morning - 10a  . polyethylene glycol  17 g Oral Daily  . senna-docusate  1  tablet Oral BID  . sulfamethoxazole-trimethoprim  2 tablet Oral Q12H   Continuous Infusions: PRN Meds:.acetaminophen, ALPRAZolam, cyclobenzaprine, ondansetron **OR** ondansetron (ZOFRAN) IV    Objective: Weight change:   Intake/Output Summary (Last 24 hours) at 02/17/2017 1252 Last data filed at 02/17/2017 1200 Gross per 24 hour  Intake 600 ml  Output 1000 ml  Net -400 ml   Blood pressure 96/72, pulse (!) 111, temperature 100 F (37.8 C), temperature source Oral, resp. rate (!) 28, height 5\' 7"  (1.702 m), weight 168 lb 10.4 oz (76.5 kg), last menstrual period 01/28/2017, SpO2 93 %. Temp:  [99.8 F (37.7 C)-100.7 F (38.2 C)] 100 F (37.8 C) (02/04 0804) Pulse Rate:  [110-117] 111 (02/04 0804) Resp:  [25-30] 28 (02/04 0804) BP: (91-100)/(56-72) 96/72 (02/04 0804) SpO2:  [93 %-100 %] 93 % (02/04 0804)  Physical Exam: General: Alert and awake, oriented x3,and dysphoric HEENT: anicteric sclera, pupils reactive to light and accommodation, EOMI CVS regular rate, normal r,  no murmur rubs or gallops Chest: clear to auscultation bilaterally, no wheezing, rales or rhonchi Abdomen: soft nontender, nondistended, normal bowel sounds, Extremities: she has minimal tenderness of clavicle, not tenderness in joint Skin: rashes stable  Neuro: nonfocal  CBC:  CBC Latest Ref Rng & Units 02/17/2017 02/16/2017 02/15/2017  WBC  4.0 - 10.5 K/uL 14.4(H) 14.3(H) 15.1(H)  Hemoglobin 12.0 - 15.0 g/dL 7.4(L) 7.7(L) 7.9(L)  Hematocrit 36.0 - 46.0 % 20.8(L) 21.6(L) 22.2(L)  Platelets 150 - 400 K/uL 174 92(L) 77(L)      BMET Recent Labs    02/16/17 0252 02/17/17 0714  NA 136 134*  K 4.5 5.0  CL 103 100*  CO2 24 20*  GLUCOSE 92 121*  BUN 11 9  CREATININE 0.76 0.96  CALCIUM 7.9* 8.0*     Liver Panel  Recent Labs    02/15/17 0247 02/16/17 0252  PROT 5.3* 5.1*  ALBUMIN 1.6* 1.5*  AST 47* 49*  ALT 27 22  ALKPHOS 153* 186*  BILITOT 2.0* 2.1*       Sedimentation Rate Recent Labs     02/15/17 0247  ESRSEDRATE 122*   C-Reactive Protein Recent Labs    02/15/17 0247  CRP 19.9*    Micro Results: Recent Results (from the past 720 hour(s))  Culture, blood (Routine x 2)     Status: Abnormal   Collection Time: 02/13/17  4:00 PM  Result Value Ref Range Status   Specimen Description BLOOD RIGHT ANTECUBITAL  Final   Special Requests IN PEDIATRIC BOTTLE Blood Culture adequate volume  Final   Culture  Setup Time   Final    GRAM POSITIVE COCCI IN PEDIATRIC BOTTLE CRITICAL RESULT CALLED TO, READ BACK BY AND VERIFIED WITHMelven Sartorius [facility] [id number] 02/14/17 A BROWNING Performed at [facility], [address], [place], [place] [address]    Culture STAPHYLOCOCCUS AUREUS (A)  Final   Report Status 02/17/2017 FINAL  Final   Organism ID, Bacteria STAPHYLOCOCCUS AUREUS  Final      Susceptibility   Staphylococcus aureus - MIC*    CIPROFLOXACIN <=0.5 SENSITIVE Sensitive     ERYTHROMYCIN <=0.25 SENSITIVE Sensitive     GENTAMICIN <=0.5 SENSITIVE Sensitive     OXACILLIN <=0.25 SENSITIVE Sensitive     TETRACYCLINE >=16 RESISTANT Resistant     VANCOMYCIN 1 SENSITIVE Sensitive     TRIMETH/SULFA <=10 SENSITIVE Sensitive     CLINDAMYCIN <=0.25 SENSITIVE Sensitive     RIFAMPIN <=0.5 SENSITIVE Sensitive     Inducible Clindamycin NEGATIVE Sensitive     * STAPHYLOCOCCUS AUREUS  Blood Culture ID Panel (Reflexed)     Status: Abnormal   Collection Time: 02/13/17  4:00 PM  Result Value Ref Range Status   Enterococcus species NOT DETECTED NOT DETECTED Final   Listeria monocytogenes NOT DETECTED NOT DETECTED Final   Staphylococcus species DETECTED (A) NOT DETECTED Final    Comment: CRITICAL RESULT CALLED TO, READ BACK BY AND VERIFIED WITH: [facility] PHARMD [id number] 02/14/17 A BROWNING    Staphylococcus aureus DETECTED (A) NOT DETECTED Final    Comment: Methicillin (oxacillin) susceptible Staphylococcus aureus (MSSA). Preferred therapy is anti staphylococcal beta lactam antibiotic (Cefazolin  or Nafcillin), unless clinically contraindicated. CRITICAL RESULT CALLED TO, READ BACK BY AND VERIFIED WITH: Melven Sartorius PHARMD [id number] 02/14/17 A BROWNING    Methicillin resistance NOT DETECTED NOT DETECTED Final   Streptococcus species NOT DETECTED NOT DETECTED Final   Streptococcus agalactiae NOT DETECTED NOT DETECTED Final   Streptococcus pneumoniae NOT DETECTED NOT DETECTED Final   Streptococcus pyogenes NOT DETECTED NOT DETECTED Final   Acinetobacter baumannii NOT DETECTED NOT DETECTED Final   Enterobacteriaceae species NOT DETECTED NOT DETECTED Final   Enterobacter cloacae complex NOT DETECTED NOT DETECTED Final   Escherichia coli NOT DETECTED NOT DETECTED Final   Klebsiella oxytoca NOT  DETECTED NOT DETECTED Final   Klebsiella pneumoniae NOT DETECTED NOT DETECTED Final   Proteus species NOT DETECTED NOT DETECTED Final   Serratia marcescens NOT DETECTED NOT DETECTED Final   Haemophilus influenzae NOT DETECTED NOT DETECTED Final   Neisseria meningitidis NOT DETECTED NOT DETECTED Final   Pseudomonas aeruginosa NOT DETECTED NOT DETECTED Final   Candida albicans NOT DETECTED NOT DETECTED Final   Candida glabrata NOT DETECTED NOT DETECTED Final   Candida krusei NOT DETECTED NOT DETECTED Final   Candida parapsilosis NOT DETECTED NOT DETECTED Final   Candida tropicalis NOT DETECTED NOT DETECTED Final    Comment: Performed at [facility], [address] [place], [place] [address]  Culture, blood (Routine x 2)     Status: Abnormal   Collection Time: 02/13/17  5:00 PM  Result Value Ref Range Status   Specimen Description BLOOD RIGHT ANTECUBITAL  Final   Special Requests   Final    BOTTLES DRAWN AEROBIC AND ANAEROBIC Blood Culture adequate volume   Culture  Setup Time   Final    GRAM POSITIVE COCCI IN CLUSTERS IN BOTH AEROBIC AND ANAEROBIC BOTTLES CRITICAL VALUE NOTED.  VALUE IS CONSISTENT WITH PREVIOUSLY REPORTED AND CALLED VALUE.    Culture (A)  Final    STAPHYLOCOCCUS  AUREUS SUSCEPTIBILITIES PERFORMED ON PREVIOUS CULTURE WITHIN THE LAST 5 DAYS. Performed at [facility], [address] [place], [place] [address]    Report Status 02/16/2017 FINAL  Final  MRSA PCR Screening     Status: None   Collection Time: 02/13/17  9:32 PM  Result Value Ref Range Status   MRSA by PCR NEGATIVE NEGATIVE Final    Comment:        The GeneXpert MRSA Assay (FDA approved for NASAL specimens only), is one component of a comprehensive MRSA colonization surveillance program. It is not intended to diagnose MRSA infection nor to guide or monitor treatment for MRSA infections.   Culture, blood (Routine X 2) w Reflex to ID Panel     Status: Abnormal   Collection Time: 02/14/17 10:07 AM  Result Value Ref Range Status   Specimen Description BLOOD RIGHT HAND  Final   Special Requests IN PEDIATRIC BOTTLE Blood Culture adequate volume  Final   Culture  Setup Time   Final    GRAM POSITIVE COCCI IN PEDIATRIC BOTTLE CRITICAL VALUE NOTED.  VALUE IS CONSISTENT WITH PREVIOUSLY REPORTED AND CALLED VALUE.    Culture (A)  Final    STAPHYLOCOCCUS AUREUS SUSCEPTIBILITIES PERFORMED ON PREVIOUS CULTURE WITHIN THE LAST 5 DAYS. Performed at [facility], [address], [place], [place] [address]    Report Status 02/17/2017 FINAL  Final    Studies/Results: Mr Cervical Spine Wo Contrast  Result Date: 02/17/2017 CLINICAL DATA:  Lower extremity weakness. Bacteremia, fever, leukocytosis. History of intravenous drug abuse. EXAM: MRI CERVICAL AND THORACIC SPINE WITHOUT CONTRAST TECHNIQUE: Multiplanar and multiecho pulse sequences of the cervical spine, to include the craniocervical junction and cervicothoracic junction, and thoracic spine, were obtained without intravenous contrast. Intravenous access not successfully gained, no contrast administered. COMPARISON:  MRI of the lumbar spine February 14, 2017 FINDINGS: MRI CERVICAL SPINE FINDINGS-moderately motion degraded examination.  ALIGNMENT: Straightened cervical lordosis.  No malalignment. VERTEBRAE/DISCS: Vertebral bodies are intact. Intervertebral disc morphology's and signal are normal. CORD:Cervical spinal cord is normal morphology and signal characteristics from the cervicomedullary junction to level of T1-2, the most caudal well visualized level. POSTERIOR FOSSA, VERTEBRAL ARTERIES, PARASPINAL TISSUES: No  MR findings of ligamentous injury. Vertebral artery flow voids present. Cerebellar tonsils descend slightly below the foramen magnum though, are not pointed in appearance and, do not reach criteria fecal ERA 1 malformation. DISC LEVELS: No disc bulge, canal stenosis nor neural foraminal narrowing. MRI THORACIC  SPINE FINDINGS-Mild motion degraded examination. ALIGNMENT: Maintenance of the thoracic kyphosis. No malalignment. VERTEBRAE/DISCS: Vertebral bodies are intact. Intervertebral discs morphology and signal are normal.12 mm T4 hemangioma. No suspicious or acute bone marrow signal. CORD: Thoracic spinal cord is normal morphology and signal characteristics to the level of the conus medullaris which terminates at T12-L1. No epidural collection by noncontrast imaging. PREVERTEBRAL AND PARASPINAL SOFT TISSUES: Small RIGHT pleural effusion. No paraspinal fluid collection by noncontrast MRI. DISC LEVELS: No disc bulge, canal stenosis nor neural foraminal narrowing. IMPRESSION: 1. Negative moderately motion degraded noncontrast MRI of the cervical spine. 2. Negative mildly motion degraded noncontrast MRI of the thoracic spine. 3. Small RIGHT pleural effusion. Electronically Signed   By: Awilda Metro M.D.   On: 02/17/2017 04:44   Mr Thoracic Spine Wo Contrast  Result Date: 02/17/2017 CLINICAL DATA:  Lower extremity weakness. Bacteremia, fever, leukocytosis. History of intravenous drug abuse. EXAM: MRI CERVICAL AND THORACIC SPINE WITHOUT CONTRAST TECHNIQUE: Multiplanar and multiecho pulse sequences of the cervical spine, to include  the craniocervical junction and cervicothoracic junction, and thoracic spine, were obtained without intravenous contrast. Intravenous access not successfully gained, no contrast administered. COMPARISON:  MRI of the lumbar spine February 14, 2017 FINDINGS: MRI CERVICAL SPINE FINDINGS-moderately motion degraded examination. ALIGNMENT: Straightened cervical lordosis.  No malalignment. VERTEBRAE/DISCS: Vertebral bodies are intact. Intervertebral disc morphology's and signal are normal. CORD:Cervical spinal cord is normal morphology and signal characteristics from the cervicomedullary junction to level of T1-2, the most caudal well visualized level. POSTERIOR FOSSA, VERTEBRAL ARTERIES, PARASPINAL TISSUES: No MR findings of ligamentous injury. Vertebral artery flow voids present. Cerebellar tonsils descend slightly below the foramen magnum though, are not pointed in appearance and, do not reach criteria fecal ERA 1 malformation. DISC LEVELS: No disc bulge, canal stenosis nor neural foraminal narrowing. MRI THORACIC  SPINE FINDINGS-Mild motion degraded examination. ALIGNMENT: Maintenance of the thoracic kyphosis. No malalignment. VERTEBRAE/DISCS: Vertebral bodies are intact. Intervertebral discs morphology and signal are normal.12 mm T4 hemangioma. No suspicious or acute bone marrow signal. CORD: Thoracic spinal cord is normal morphology and signal characteristics to the level of the conus medullaris which terminates at T12-L1. No epidural collection by noncontrast imaging. PREVERTEBRAL AND PARASPINAL SOFT TISSUES: Small RIGHT pleural effusion. No paraspinal fluid collection by noncontrast MRI. DISC LEVELS: No disc bulge, canal stenosis nor neural foraminal narrowing. IMPRESSION: 1. Negative moderately motion degraded noncontrast MRI of the cervical spine. 2. Negative mildly motion degraded noncontrast MRI of the thoracic spine. 3. Small RIGHT pleural effusion. Electronically Signed   By: Awilda Metro M.D.   On:  02/17/2017 04:44   US Pelvic Complete With Transvaginal  Result Date: 02/15/2017 CLINICAL DATA:  Pelvic pain.  Pain for 1 week. EXAM: TRANSABDOMINAL AND TRANSVAGINAL ULTRASOUND OF PELVIS TECHNIQUE: Both transabdominal and transvaginal ultrasound examinations of the pelvis were performed. Transabdominal technique was performed for global imaging of the pelvis including uterus, ovaries, adnexal regions, and pelvic cul-de-sac. It was necessary to proceed with endovaginal exam following the transabdominal exam to visualize the endometrium and ovaries. COMPARISON:  None FINDINGS: Uterus Measurements: 8.9 x 4.2 x 5.1 cm. No fibroids or other mass visualized. Endometrium Thickness: 5 mm.  No focal abnormality visualized. Right ovary Measurements: 2.2 x  2.2 x 1.5 cm. Normal appearance/no adnexal mass. Left ovary Not visualized. Other findings No abnormal free fluid. IMPRESSION: 1. Nonvisualized left ovary. 2. Otherwise normal pelvic ultrasound. Electronically Signed   By: Elige Ko   On: 02/15/2017 16:25      Assessment/Plan:  INTERVAL HISTORY: she lost IV access over weekend now on po bactrim and had MR without contrast showing no infection in spine   Active Problems:   Sepsis (HCC)   Pelvic pain    ROSHNI BURBANO is a 32 y.o. female with  IVDU, MSSA bacteremia and sepsis with shoulder (clavicle pain), tender area on her left hand and LE weakness  #1       Antimicrobial Management Team Staphylococcus aureus bacteremia   Staphylococcus aureus bacteremia (SAB) is associated with a high rate of complications and mortality.  Specific aspects of clinical management are critical to optimizing the outcome of patients with SAB.  Therefore, the [facility] Antimicrobial Management Team [facility]) has initiated an intervention aimed at improving the management of SAB at [facility].  To do so, Infectious Diseases physicians are providing an evidence-based consult for the management of all patients  with SAB.     Yes No Comments  Perform follow-up blood cultures (even if the patient is afebrile) to ensure clearance of bacteremia [x]  []  Repeat again today  Remove vascular catheter and obtain follow-up blood cultures after the removal of the catheter []  []  DO NOT PLACE PICC OR CENTRAL LINE AT THIS POINT WE WILL TRY TO CLEAR BACTEREMIA WITH PO BACTRIM FOR NOW  Perform echocardiography to evaluate for endocarditis (transthoracic ECHO is 40-50% sensitive, TEE is > 90% sensitive) []  []  Please keep in mind, that neither test can definitively EXCLUDE endocarditis, and that should clinical suspicion remain high for endocarditis the patient should then still be treated with an "endocarditis" duration of therapy = 6 weeks  SHE NEEDS TEE BUT NEEDS IV ACCESS FOR THIS  Consult electrophysiologist to evaluate implanted cardiac device (pacemaker, ICD) []  []    Ensure source control []  []  Have all abscesses been drained effectively? Have deep seeded infections (septic joints or osteomyelitis) had appropriate surgical debridement?  Investigate for "metastatic" sites of infection []  []  Does the patient have ANY symptom or physical exam finding that would suggest a deeper infection (back or neck pain that may be suggestive of vertebral osteomyelitis or epidural abscess, muscle pain that could be a symptom of pyomyositis)?  Keep in mind that for deep seeded infections MRI imaging with contrast is preferred rather than other often insensitive tests such as plain x-rays, especially early in a patient's presentation.  SHE REFUSING FURTHER MRI, SO CT POSSIBILITY BUT WOULD WANT CONTRAST TO LOOK AT CLAVICLE ON THE RIGHT  Change antibiotic therapy to BACTRIM DS 2 BID FOR NOW []  []  Beta-lactam antibiotics are preferred for MSSA due to higher cure rates.   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

## 2017-02-17 NOTE — Progress Notes (Signed)
Pt to MRI.  Unable to do MRI with contrast as pt does not have any IV access.  [facility]  Unable to gain any access.

## 2017-02-17 NOTE — Progress Notes (Signed)
Occupational Therapy Evaluation Patient Details Name: [name] MRN: [id number] DOB: November 23, 1985 Today's Date: 02/17/2017    History of Present Illness Pt is a 32 y.o. female, with past medical history significant for fibromyalgia, anemia and history of IV drug abuse. She presented to the ED with a few days history of fever, chills, and generalized weakness muscle aches in addition to arthralgias.  She was admitted with diagnosis of sepsis.    Clinical Impression   PTA, pt independent with ADL and mobility. Pt lives in an apt attached to her parent's house and has 3 children (13,11 and 4). Pt states she does not work. Pt currently requires min A with mobility and ADL @ RW level and is able to tolerate short distances of ambulation  - @10  feet due to HR maintaining @ 126 and complaints of BLE weakness. Pt complaining of R "clavicle" pain - recommend pt use ice at this time on shoulder. At this time recommend CIR although pt may progress to DC home. Will follow acutely.     Follow Up Recommendations  CIR;Supervision/Assistance - 24 hour    Equipment Recommendations  3 in 1 bedside commode    Recommendations for Other Services Rehab consult     Precautions / Restrictions Precautions Precautions: Fall Restrictions Weight Bearing Restrictions: No      Mobility Bed Mobility Overal bed mobility: Needs Assistance Bed Mobility: Supine to Sit     Supine to sit: HOB elevated;Supervision     General bed mobility comments: Pt states this is a "big improvement"  Transfers Overall transfer level: Needs assistance Equipment used: Rolling walker (2 wheeled) Transfers: Sit to/from [employer] Sit to Stand: Min assist Stand pivot transfers: Min assist       General transfer comment: increased time to complete, assist to power up    Balance Overall balance assessment: Needs assistance Sitting-balance support: No upper extremity supported;Feet supported Sitting  balance-Leahy Scale: Good     Standing balance support: Bilateral upper extremity supported;During functional activity Standing balance-Leahy Scale: Fair Standing balance comment: heavy reliance on RW                           ADL either performed or assessed with clinical judgement   ADL Overall ADL's : Needs assistance/impaired Eating/Feeding: Independent   Grooming: Sitting;Set up;Supervision/safety   Upper Body Bathing: Set up;Sitting   Lower Body Bathing: Minimal assistance;Sit to/from stand   Upper Body Dressing : Minimal assistance;Sitting   Lower Body Dressing: Moderate assistance;Sit to/from stand   Toilet Transfer: Minimal assistance;RW;Ambulation;BSC   Toileting- Clothing Manipulation and Hygiene: Set up;Supervision/safety;Sit to/from stand;Sitting/lateral lean       Functional mobility during ADLs: Minimal assistance;Rolling walker General ADL Comments: HR increases to 125 with minimal activity     Vision Baseline Vision/History: Wears glasses Additional Comments: wears contacts     Perception     Praxis      Pertinent Vitals/Pain Pain Assessment: 0-10 Pain Score: 6  Pain Location: R clavicle Pain Descriptors / Indicators: Sore;Guarding;Grimacing     Hand Dominance Right   Extremity/Trunk Assessment Upper Extremity Assessment Upper Extremity Assessment: RUE deficits/detail RUE Deficits / Details: able to abduct @ 45 degress shoulder -limited use; elbow/wrist/hand WFL Painful with WB RUE Coordination: decreased gross motor   Lower Extremity Assessment Lower Extremity Assessment: Defer to PT evaluation   Cervical / Trunk Assessment Cervical / Trunk Assessment: Normal   Communication Communication Communication: No difficulties  Cognition Arousal/Alertness: Awake/alert Behavior During Therapy: WFL for tasks assessed/performed Overall Cognitive Status: Within Functional Limits for tasks assessed                                      General Comments       Exercises     Shoulder Instructions      Home Living Family/patient expects to be discharged to:: Private residence Living Arrangements: Parent Available Help at Discharge: Family;Available 24 hours/day Type of Home: House Home Access: Stairs to enter [employer] CorporationEntrance Stairs-Number of Steps: 2 Entrance Stairs-Rails: None Home Layout: Two level;Able to live on main level with bedroom/bathroom     Bathroom Shower/Tub: Tub/shower unit;Curtain   FirefighterBathroom Toilet: Standard Bathroom Accessibility: Yes How Accessible: Accessible via walker Home Equipment: Hand held shower head          Prior Functioning/Environment Level of Independence: Independent        Comments: Has 3 kids  - 13,11,4        OT Problem List: Decreased strength;Decreased range of motion;Decreased activity tolerance;Impaired balance (sitting and/or standing);Decreased coordination;Decreased safety awareness;Decreased knowledge of use of DME or AE;Cardiopulmonary status limiting activity;Pain      OT Treatment/Interventions: Self-care/ADL training;Therapeutic exercise;Energy conservation;DME and/or AE instruction;Therapeutic activities;Patient/family education;Balance training    OT Goals(Current goals can be found in the care plan section) Acute Rehab OT Goals Patient Stated Goal: get better OT Goal Formulation: With patient Time For Goal Achievement: 03/03/17 Potential to Achieve Goals: Good  OT Frequency: Min 3X/week   Barriers to D/C:            Co-evaluation              AM-PAC PT "6 Clicks" Daily Activity     Outcome Measure Help from another person eating meals?: None Help from another person taking care of personal grooming?: A Little Help from another person toileting, which includes using toliet, bedpan, or urinal?: A Little Help from another person bathing (including washing, rinsing, drying)?: A Little Help from another person to put on and  taking off regular upper body clothing?: A Little Help from another person to put on and taking off regular lower body clothing?: A Lot 6 Click Score: 18   End of Session Equipment Utilized During Treatment: Gait belt;Rolling walker Nurse Communication: Mobility status  Activity Tolerance: Patient tolerated treatment well Patient left: in bed;with call bell/phone within reach;with bed alarm set  OT Visit Diagnosis: Other abnormalities of gait and mobility (R26.89);Muscle weakness (generalized) (M62.81);Pain Pain - Right/Left: Right Pain - part of body: Shoulder                Time: 1457-1520 OT Time Calculation (min): 23 min Charges:  OT General Charges $OT Visit: 1 Visit OT Evaluation $OT Eval Moderate Complexity: 1 Mod OT Treatments $Self Care/Home Management : 8-22 mins G-Codes:     [facility] Hospitalilary Marti Acebo, OT/L  [phone] 02/17/2017  Cashius Grandstaff,HILLARY 02/17/2017, 3:24 PM

## 2017-02-18 DIAGNOSIS — M25512 Pain in left shoulder: Secondary | ICD-10-CM

## 2017-02-18 DIAGNOSIS — R29898 Other symptoms and signs involving the musculoskeletal system: Secondary | ICD-10-CM

## 2017-02-18 LAB — COMPREHENSIVE METABOLIC PANEL
ALT: 22 U/L (ref 14–54)
ANION GAP: 13 (ref 5–15)
AST: 50 U/L — ABNORMAL HIGH (ref 15–41)
Albumin: 1.6 g/dL — ABNORMAL LOW (ref 3.5–5.0)
Alkaline Phosphatase: 173 U/L — ABNORMAL HIGH (ref 38–126)
BUN: 10 mg/dL (ref 6–20)
CHLORIDE: 100 mmol/L — AB (ref 101–111)
CO2: 22 mmol/L (ref 22–32)
Calcium: 8.2 mg/dL — ABNORMAL LOW (ref 8.9–10.3)
Creatinine, Ser: 1.06 mg/dL — ABNORMAL HIGH (ref 0.44–1.00)
GFR calc non Af Amer: >60 mL/min (ref >60)
Glucose, Bld: 79 mg/dL (ref 65–99)
POTASSIUM: 5 mmol/L (ref 3.5–5.1)
SODIUM: 135 mmol/L (ref 135–145)
Total Bilirubin: 1.4 mg/dL — ABNORMAL HIGH (ref 0.3–1.2)
Total Protein: 6.1 g/dL — ABNORMAL LOW (ref 6.5–8.1)

## 2017-02-18 LAB — CBC
HCT: 20.3 % — ABNORMAL LOW (ref 36.0–46.0)
HEMOGLOBIN: 7 g/dL — AB (ref 12.0–15.0)
MCH: 28.8 pg (ref 26.0–34.0)
MCHC: 34.5 g/dL (ref 30.0–36.0)
MCV: 83.5 fL (ref 78.0–100.0)
Platelets: 281 10*3/uL (ref 150–400)
RBC: 2.43 MIL/uL — AB (ref 3.87–5.11)
RDW: 14.1 % (ref 11.5–15.5)
WBC: 14.5 10*3/uL — ABNORMAL HIGH (ref 4.0–10.5)

## 2017-02-18 LAB — HIV ANTIBODY (ROUTINE TESTING W REFLEX): HIV Screen 4th Generation wRfx: NONREACTIVE

## 2017-02-18 MED ORDER — SODIUM CHLORIDE 0.9 % IV SOLN: Freq: Once | INTRAVENOUS | Status: DC

## 2017-02-18 MED ORDER — LINEZOLID 600 MG PO TABS
600.0000 mg | ORAL_TABLET | Freq: Two times a day (BID) | ORAL | Status: DC
  Administered 2017-02-18 – 2017-02-19 (×3): 600 mg via ORAL
  Filled 2017-02-18 (×3): qty 1

## 2017-02-18 MED ORDER — FERROUS SULFATE 325 (65 FE) MG PO TABS
325.0000 mg | ORAL_TABLET | Freq: Three times a day (TID) | ORAL | Status: DC
  Administered 2017-02-18 – 2017-04-15 (×161): 325 mg via ORAL
  Filled 2017-02-18 (×161): qty 1

## 2017-02-18 NOTE — Progress Notes (Signed)
Subjective:  Claims clavicular pain is better   Antibiotics:  Anti-infectives (From admission, onward)   Start     Dose/Rate Route Frequency Ordered Stop   02/17/17 0800  sulfamethoxazole-trimethoprim (BACTRIM DS,SEPTRA DS) 800-160 MG per tablet 2 tablet     2 tablet Oral Every 12 hours 02/16/17 1453     02/16/17 1600  sulfamethoxazole-trimethoprim (BACTRIM DS,SEPTRA DS) 800-160 MG per tablet 2 tablet     2 tablet Oral  Once 02/16/17 1509 02/16/17 1657   02/14/17 1400  ceFAZolin (ANCEF) IVPB 2g/100 mL premix  Status:  Discontinued     2 g 200 mL/hr over 30 Minutes Intravenous Every 8 hours 02/14/17 1013 02/16/17 1453   02/14/17 0200  vancomycin (VANCOCIN) IVPB 750 mg/150 ml premix  Status:  Discontinued     750 mg 150 mL/hr over 60 Minutes Intravenous Every 8 hours 02/13/17 1739 02/14/17 1017   02/14/17 0000  piperacillin-tazobactam (ZOSYN) IVPB 3.375 g  Status:  Discontinued     3.375 g 12.5 mL/hr over 240 Minutes Intravenous Every 8 hours 02/13/17 1739 02/14/17 1013   02/13/17 1715  piperacillin-tazobactam (ZOSYN) IVPB 3.375 g     3.375 g 100 mL/hr over 30 Minutes Intravenous  Once 02/13/17 1701 02/13/17 1824   02/13/17 1715  vancomycin (VANCOCIN) IVPB 1000 mg/200 mL premix  Status:  Discontinued     1,000 mg 200 mL/hr over 60 Minutes Intravenous  Once 02/13/17 1701 02/13/17 1705   02/13/17 1715  vancomycin (VANCOCIN) 1,500 mg in sodium chloride 0.9 % 500 mL IVPB     1,500 mg 250 mL/hr over 120 Minutes Intravenous  Once 02/13/17 1705 02/13/17 2300      Medications: Scheduled Meds: . buprenorphine-naloxone  2 tablet Sublingual Daily  . DULoxetine  60 mg Oral BID  . enoxaparin (LOVENOX) injection  40 mg Subcutaneous Q24H  . feeding supplement  1 Container Oral TID BM  . multivitamin with minerals  1 tablet Oral Daily  . OLANZapine  5 mg Oral q morning - 10a  . polyethylene glycol  17 g Oral Daily  . senna-docusate  1 tablet Oral BID  .  sulfamethoxazole-trimethoprim  2 tablet Oral Q12H   Continuous Infusions: . sodium chloride     PRN Meds:.acetaminophen, ALPRAZolam, cyclobenzaprine, ondansetron **OR** ondansetron (ZOFRAN) IV    Objective: Weight change:   Intake/Output Summary (Last 24 hours) at 02/18/2017 1249 Last data filed at 02/18/2017 0609 Gross per 24 hour  Intake -  Output 5 ml  Net -5 ml   Blood pressure (!) 94/56, pulse (!) 108, temperature 99 F (37.2 C), resp. rate 20, height 5\' 7"  (1.702 m), weight 177 lb 14.6 oz (80.7 kg), last menstrual period 01/28/2017, SpO2 92 %. Temp:  [98.8 F (37.1 C)-100 F (37.8 C)] 99 F (37.2 C) (02/05 0612) Pulse Rate:  [97-125] 108 (02/05 0612) Resp:  [18-24] 20 (02/05 0612) BP: (94-105)/(49-72) 94/56 (02/05 0612) SpO2:  [91 %-95 %] 92 % (02/05 0612) Weight:  [177 lb 14.6 oz (80.7 kg)] 177 lb 14.6 oz (80.7 kg) (02/04 1500)  Physical Exam: General: Alert and awake, oriented x3,and dysphoric HEENT: anicteric sclera, pupils reactive to light and accommodation, EOMI CVS regular rate, normal r,  no murmur rubs or gallops Chest: clear to auscultation bilaterally, no wheezing, rales or rhonchi Abdomen: soft nontender, nondistended, normal bowel sounds, Extremities: she has minimal tenderness of clavicle,  Skin: rashes stable  Neuro: nonfocal  CBC:  CBC Latest Ref Rng &  Units 02/18/2017 02/17/2017 02/16/2017  WBC 4.0 - 10.5 K/uL 14.5(H) 14.4(H) 14.3(H)  Hemoglobin 12.0 - 15.0 g/dL 7.0(L) 7.4(L) 7.7(L)  Hematocrit 36.0 - 46.0 % 20.3(L) 20.8(L) 21.6(L)  Platelets 150 - 400 K/uL 281 174 92(L)      BMET Recent Labs    02/17/17 0714 02/18/17 0503  NA 134* 135  K 5.0 5.0  CL 100* 100*  CO2 20* 22  GLUCOSE 121* 79  BUN 9 10  CREATININE 0.96 1.06*  CALCIUM 8.0* 8.2*     Liver Panel  Recent Labs    02/16/17 0252 02/18/17 0503  PROT 5.1* 6.1*  ALBUMIN 1.5* 1.6*  AST 49* 50*  ALT 22 22  ALKPHOS 186* 173*  BILITOT 2.1* 1.4*       Sedimentation  Rate No results for input(s): ESRSEDRATE in the last 72 hours. C-Reactive Protein No results for input(s): CRP in the last 72 hours.  Micro Results: Recent Results (from the past 720 hour(s))  Culture, blood (Routine x 2)     Status: Abnormal   Collection Time: 02/13/17  4:00 PM  Result Value Ref Range Status   Specimen Description BLOOD RIGHT ANTECUBITAL  Final   Special Requests IN PEDIATRIC BOTTLE Blood Culture adequate volume  Final   Culture  Setup Time   Final    GRAM POSITIVE COCCI IN PEDIATRIC BOTTLE CRITICAL RESULT CALLED TO, READ BACK BY AND VERIFIED WITHMelven Sartorius: J [facility]FORD [facility] [id number] 02/14/17 A BROWNING Performed at [facility], [address] [place], [place] [address]    Culture STAPHYLOCOCCUS AUREUS (A)  Final   Report Status 02/17/2017 FINAL  Final   Organism ID, Bacteria STAPHYLOCOCCUS AUREUS  Final      Susceptibility   Staphylococcus aureus - MIC*    CIPROFLOXACIN <=0.5 SENSITIVE Sensitive     ERYTHROMYCIN <=0.25 SENSITIVE Sensitive     GENTAMICIN <=0.5 SENSITIVE Sensitive     OXACILLIN <=0.25 SENSITIVE Sensitive     TETRACYCLINE >=16 RESISTANT Resistant     VANCOMYCIN 1 SENSITIVE Sensitive     TRIMETH/SULFA <=10 SENSITIVE Sensitive     CLINDAMYCIN <=0.25 SENSITIVE Sensitive     RIFAMPIN <=0.5 SENSITIVE Sensitive     Inducible Clindamycin NEGATIVE Sensitive     * STAPHYLOCOCCUS AUREUS  Blood Culture ID Panel (Reflexed)     Status: Abnormal   Collection Time: 02/13/17  4:00 PM  Result Value Ref Range Status   Enterococcus species NOT DETECTED NOT DETECTED Final   Listeria monocytogenes NOT DETECTED NOT DETECTED Final   Staphylococcus species DETECTED (A) NOT DETECTED Final    Comment: CRITICAL RESULT CALLED TO, READ BACK BY AND VERIFIED WITH: J [facility] PHARMD [phone] 02/14/17 A BROWNING    Staphylococcus aureus DETECTED (A) NOT DETECTED Final    Comment: Methicillin (oxacillin) susceptible Staphylococcus aureus (MSSA). Preferred therapy is anti staphylococcal  beta lactam antibiotic (Cefazolin or Nafcillin), unless clinically contraindicated. CRITICAL RESULT CALLED TO, READ BACK BY AND VERIFIED WITH: Melven SartoriusJ LEDFORD PHARMD [phone] 02/14/17 A BROWNING    Methicillin resistance NOT DETECTED NOT DETECTED Final   Streptococcus species NOT DETECTED NOT DETECTED Final   Streptococcus agalactiae NOT DETECTED NOT DETECTED Final   Streptococcus pneumoniae NOT DETECTED NOT DETECTED Final   Streptococcus pyogenes NOT DETECTED NOT DETECTED Final   Acinetobacter baumannii NOT DETECTED NOT DETECTED Final   Enterobacteriaceae species NOT DETECTED NOT DETECTED Final   Enterobacter cloacae complex NOT DETECTED NOT DETECTED Final   Escherichia coli NOT DETECTED NOT DETECTED Final   Klebsiella  oxytoca NOT DETECTED NOT DETECTED Final   Klebsiella pneumoniae NOT DETECTED NOT DETECTED Final   Proteus species NOT DETECTED NOT DETECTED Final   Serratia marcescens NOT DETECTED NOT DETECTED Final   Haemophilus influenzae NOT DETECTED NOT DETECTED Final   Neisseria meningitidis NOT DETECTED NOT DETECTED Final   Pseudomonas aeruginosa NOT DETECTED NOT DETECTED Final   Candida albicans NOT DETECTED NOT DETECTED Final   Candida glabrata NOT DETECTED NOT DETECTED Final   Candida krusei NOT DETECTED NOT DETECTED Final   Candida parapsilosis NOT DETECTED NOT DETECTED Final   Candida tropicalis NOT DETECTED NOT DETECTED Final    Comment: Performed at [facility], [address], [place], [place] [address]  Culture, blood (Routine x 2)     Status: Abnormal   Collection Time: 02/13/17  5:00 PM  Result Value Ref Range Status   Specimen Description BLOOD RIGHT ANTECUBITAL  Final   Special Requests   Final    BOTTLES DRAWN AEROBIC AND ANAEROBIC Blood Culture adequate volume   Culture  Setup Time   Final    GRAM POSITIVE COCCI IN CLUSTERS IN BOTH AEROBIC AND ANAEROBIC BOTTLES CRITICAL VALUE NOTED.  VALUE IS CONSISTENT WITH PREVIOUSLY REPORTED AND CALLED VALUE.    Culture (A)   Final    STAPHYLOCOCCUS AUREUS SUSCEPTIBILITIES PERFORMED ON PREVIOUS CULTURE WITHIN THE LAST 5 DAYS. Performed at [facility], [address] [place], [place] [address]    Report Status 02/16/2017 FINAL  Final  MRSA PCR Screening     Status: None   Collection Time: 02/13/17  9:32 PM  Result Value Ref Range Status   MRSA by PCR NEGATIVE NEGATIVE Final    Comment:        The GeneXpert MRSA Assay (FDA approved for NASAL specimens only), is one component of a comprehensive MRSA colonization surveillance program. It is not intended to diagnose MRSA infection nor to guide or monitor treatment for MRSA infections.   Culture, blood (Routine X 2) w Reflex to ID Panel     Status: Abnormal   Collection Time: 02/14/17 10:07 AM  Result Value Ref Range Status   Specimen Description BLOOD RIGHT HAND  Final   Special Requests IN PEDIATRIC BOTTLE Blood Culture adequate volume  Final   Culture  Setup Time   Final    GRAM POSITIVE COCCI IN PEDIATRIC BOTTLE CRITICAL VALUE NOTED.  VALUE IS CONSISTENT WITH PREVIOUSLY REPORTED AND CALLED VALUE.    Culture (A)  Final    STAPHYLOCOCCUS AUREUS SUSCEPTIBILITIES PERFORMED ON PREVIOUS CULTURE WITHIN THE LAST 5 DAYS. Performed at [facility], [address] [place], [place] [address]    Report Status 02/17/2017 FINAL  Final  Culture, blood (Routine X 2) w Reflex to ID Panel     Status: None (Preliminary result)   Collection Time: 02/17/17 10:10 AM  Result Value Ref Range Status   Specimen Description BLOOD RIGHT HAND  Final   Special Requests IN PEDIATRIC BOTTLE Blood Culture adequate volume  Final   Culture  Setup Time   Final    GRAM POSITIVE COCCI IN PEDIATRIC BOTTLE CRITICAL VALUE NOTED.  VALUE IS CONSISTENT WITH PREVIOUSLY REPORTED AND CALLED VALUE. Performed at [facility], [address] [place], [place] [address]    Culture GRAM POSITIVE COCCI  Final   Report Status PENDING  Incomplete    Studies/Results: Mr  Cervical Spine Wo Contrast  Result Date: 02/17/2017 CLINICAL DATA:  Lower extremity weakness. Bacteremia,  fever, leukocytosis. History of intravenous drug abuse. EXAM: MRI CERVICAL AND THORACIC SPINE WITHOUT CONTRAST TECHNIQUE: Multiplanar and multiecho pulse sequences of the cervical spine, to include the craniocervical junction and cervicothoracic junction, and thoracic spine, were obtained without intravenous contrast. Intravenous access not successfully gained, no contrast administered. COMPARISON:  MRI of the lumbar spine February 14, 2017 FINDINGS: MRI CERVICAL SPINE FINDINGS-moderately motion degraded examination. ALIGNMENT: Straightened cervical lordosis.  No malalignment. VERTEBRAE/DISCS: Vertebral bodies are intact. Intervertebral disc morphology's and signal are normal. CORD:Cervical spinal cord is normal morphology and signal characteristics from the cervicomedullary junction to level of T1-2, the most caudal well visualized level. POSTERIOR FOSSA, VERTEBRAL ARTERIES, PARASPINAL TISSUES: No MR findings of ligamentous injury. Vertebral artery flow voids present. Cerebellar tonsils descend slightly below the foramen magnum though, are not pointed in appearance and, do not reach criteria fecal ERA 1 malformation. DISC LEVELS: No disc bulge, canal stenosis nor neural foraminal narrowing. MRI THORACIC  SPINE FINDINGS-Mild motion degraded examination. ALIGNMENT: Maintenance of the thoracic kyphosis. No malalignment. VERTEBRAE/DISCS: Vertebral bodies are intact. Intervertebral discs morphology and signal are normal.12 mm T4 hemangioma. No suspicious or acute bone marrow signal. CORD: Thoracic spinal cord is normal morphology and signal characteristics to the level of the conus medullaris which terminates at T12-L1. No epidural collection by noncontrast imaging. PREVERTEBRAL AND PARASPINAL SOFT TISSUES: Small RIGHT pleural effusion. No paraspinal fluid collection by noncontrast MRI. DISC LEVELS: No disc bulge,  canal stenosis nor neural foraminal narrowing. IMPRESSION: 1. Negative moderately motion degraded noncontrast MRI of the cervical spine. 2. Negative mildly motion degraded noncontrast MRI of the thoracic spine. 3. Small RIGHT pleural effusion. Electronically Signed   By: Awilda Metro M.D.   On: 02/17/2017 04:44   Mr Thoracic Spine Wo Contrast  Result Date: 02/17/2017 CLINICAL DATA:  Lower extremity weakness. Bacteremia, fever, leukocytosis. History of intravenous drug abuse. EXAM: MRI CERVICAL AND THORACIC SPINE WITHOUT CONTRAST TECHNIQUE: Multiplanar and multiecho pulse sequences of the cervical spine, to include the craniocervical junction and cervicothoracic junction, and thoracic spine, were obtained without intravenous contrast. Intravenous access not successfully gained, no contrast administered. COMPARISON:  MRI of the lumbar spine February 14, 2017 FINDINGS: MRI CERVICAL SPINE FINDINGS-moderately motion degraded examination. ALIGNMENT: Straightened cervical lordosis.  No malalignment. VERTEBRAE/DISCS: Vertebral bodies are intact. Intervertebral disc morphology's and signal are normal. CORD:Cervical spinal cord is normal morphology and signal characteristics from the cervicomedullary junction to level of T1-2, the most caudal well visualized level. POSTERIOR FOSSA, VERTEBRAL ARTERIES, PARASPINAL TISSUES: No MR findings of ligamentous injury. Vertebral artery flow voids present. Cerebellar tonsils descend slightly below the foramen magnum though, are not pointed in appearance and, do not reach criteria fecal ERA 1 malformation. DISC LEVELS: No disc bulge, canal stenosis nor neural foraminal narrowing. MRI THORACIC  SPINE FINDINGS-Mild motion degraded examination. ALIGNMENT: Maintenance of the thoracic kyphosis. No malalignment. VERTEBRAE/DISCS: Vertebral bodies are intact. Intervertebral discs morphology and signal are normal.12 mm T4 hemangioma. No suspicious or acute bone marrow signal. CORD: Thoracic  spinal cord is normal morphology and signal characteristics to the level of the conus medullaris which terminates at T12-L1. No epidural collection by noncontrast imaging. PREVERTEBRAL AND PARASPINAL SOFT TISSUES: Small RIGHT pleural effusion. No paraspinal fluid collection by noncontrast MRI. DISC LEVELS: No disc bulge, canal stenosis nor neural foraminal narrowing. IMPRESSION: 1. Negative moderately motion degraded noncontrast MRI of the cervical spine. 2. Negative mildly motion degraded noncontrast MRI of the thoracic spine. 3. Small RIGHT pleural effusion. Electronically Signed   By: Pernell Dupre  Bloomer M.D.   On: 02/17/2017 04:44      Assessment/Plan:  INTERVAL HISTORY:  Still with persistently positive blood cultures  Active Problems:   Sepsis affecting skin (HCC)   Pelvic pain   Cellulitis of left hand   Staphylococcus aureus bacteremia with sepsis (HCC)   Weakness of both lower extremities   Pain of right clavicle   IVDU (intravenous drug user)   Hepatitis C antibody positive in blood    [name] is a 32 y.o. female with  IVDU, MSSA bacteremia and sepsis with shoulder (clavicle pain), tender area on her left hand and LE weakness  #1      [facility] Staphylococcus aureus bacteremia   Staphylococcus aureus bacteremia (SAB) is associated with a high rate of complications and mortality.  Specific aspects of clinical management are critical to optimizing the outcome of patients with SAB.  Therefore, the [facility] Antimicrobial Management Team [facility]) has initiated an intervention aimed at improving the management of SAB at [facility].  To do so, Infectious Diseases physicians are providing an evidence-based consult for the management of all patients with SAB.     Yes No Comments  Perform follow-up blood cultures (even if the patient is afebrile) to ensure clearance of bacteremia [x]  []  Still positive  Remove vascular catheter and obtain  follow-up blood cultures after the removal of the catheter []  []  Given she is NOT clearing with po bactrim will switch to po ZYVOX with normalization of platelets  Perform echocardiography to evaluate for endocarditis (transthoracic ECHO is 40-50% sensitive, TEE is > 90% sensitive) []  []  Please keep in mind, that neither test can definitively EXCLUDE endocarditis, and that should clinical suspicion remain high for endocarditis the patient should then still be treated with an "endocarditis" duration of therapy = 6 weeks  SHE NEEDS TEE BUT NEEDS IV ACCESS FOR THIS  Consult electrophysiologist to evaluate implanted cardiac device (pacemaker, ICD) []  []    Ensure source control []  []  Have all abscesses been drained effectively? Have deep seeded infections (septic joints or osteomyelitis) had appropriate surgical debridement?  Investigate for "metastatic" sites of infection []  []  Does the patient have ANY symptom or physical exam finding that would suggest a deeper infection (back or neck pain that may be suggestive of vertebral osteomyelitis or epidural abscess, muscle pain that could be a symptom of pyomyositis)?  Keep in mind that for deep seeded infections MRI imaging with contrast is preferred rather than other often insensitive tests such as plain x-rays, especially early in a patient's presentation.  SHE REFUSING FURTHER MRI, SO CT POSSIBILITY BUT WOULD WANT CONTRAST TO LOOK AT CLAVICLE ON THE RIGHT but not yet clearing her bacteremia  Change antibiotic therapy to zyvox 600 mg po BID []  []  Beta-lactam antibiotics are preferred for MSSA due to higher cure rates.   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

## 2017-02-18 NOTE — Progress Notes (Signed)
PROGRESS NOTE    [name]  NFA:[id number] DOB: 1985-12-06 DOA: 02/13/2017 PCP: Pearson Grippe, MD    Brief Narrative:  32 year old female who presented with a chief complaint weakness.  She does have a significant past medical history for fibromyalgia, chronic anemia and history intravenous drug abuse.  Complaint of fever, chills, generalized weakness for the last 2 days prior to hospitalization.  She had erythema and petechiae on her left hand and bilateral lower extremities.  On initial physical examination blood pressure 86/52, heart rate 70, temperature 100.7, respiratory 22 and oxygen saturation 97%.  Lungs were clear to auscultation bilaterally, no wheezing, rales or rhonchi, heart S1-S2 present, rhythmic, no gallops, rubs or murmurs, the abdomen was soft nontender, lower extremities with edema and petechiae lesions.  Sodium 130, potassium 3.7, sodium 88, bicarb 27, glucose 107, BUN 9, creatinine 0.72, white count 14.4, hemoglobin 11.5, hematocrit 33.1, platelets 109.  Urinalysis too numerous to count RBCs, 6-30 white cells, chest x-ray had bibasilar atelectasis, EKG sinus rhythm, 128 bpm, normal axis, normal intervals.  Patient was admitted to the hospital and diagnosis of sepsis, due to left hand cellulitis, rule out endocarditis.  Assessment & Plan:   Active Problems:   Sepsis affecting skin (HCC)   Pelvic pain   Cellulitis of left hand   Staphylococcus aureus bacteremia with sepsis (HCC)   Weakness of both lower extremities   Pain of right clavicle   IVDU (intravenous drug user)   Hepatitis C antibody positive in blood   1.  Staphylococcus aureus bacteremia, complicated with sepsis. Patient has los her IV, will continue antibiotic therapy with po Linezolid, no clinical signs of worsening infection, left hand with improved rash, no joint involvement. Will continue to follow on cultures, cell count and temperature curve. Pending TEE  2.  Acute on chronic anemia of iron deficiency.  Likely symptomatic, hb now down to 7, not able to transfuse prbc due to lack of IV access. Will start patient with iron supplements and prophylactic bowel regimen. Will avoid not necessary blood sampling.   3.  Urinary tract infection. Completed antibiotic therapy.   4.  Hyponatremia and hypokalemia. Improved electrolytes, patient tolerating po well.   5.  Thrombocytopenia. Stable cell counts.   6.  IV drug abuse. No signs of withdrawal, will continue neuro checks per unit protocol. As needed alprazolam. Continue suboxone.   7. Depression. Continue cymbalta and olanzapine.    DVT prophylaxis: enoxaparin  Code Status:  full Family Communication: I spoke with patient's daughter at the bedside and all questions were addressed.  Disposition Plan: home   Consultants:   ID  Procedures:     Antimicrobials:       Subjective: Patient feeling better, still weak on her lower extremities, no nausea or vomiting, no dyspnea or chest pain.   Objective: Vitals:   02/17/17 1300 02/17/17 1500 02/17/17 2141 02/18/17 0612  BP: 105/71 (!) 97/49 101/72 (!) 94/56  Pulse: (!) 125 97 (!) 112 (!) 108  Resp: (!) 24 (!) 24 18 20   Temp: 99.2 F (37.3 C) 100 F (37.8 C) 98.8 F (37.1 C) 99 F (37.2 C)  TempSrc: Oral Oral Oral   SpO2: 95% 91% 93% 92%  Weight:  80.7 kg (177 lb 14.6 oz)    Height:  5\' 7"  (1.702 m)      Intake/Output Summary (Last 24 hours) at 02/18/2017 1354 Last data filed at 02/18/2017 4696 Gross per 24 hour  Intake -  Output 5 ml  Net -5 ml   Filed Weights   02/13/17 2120 02/17/17 1500  Weight: 76.5 kg (168 lb 10.4 oz) 80.7 kg (177 lb 14.6 oz)    Examination:   General: Not in pain or dyspnea, deconditioned Neurology: Awake and alert, non focal  E ENT: positive  pallor, no icterus, oral mucosa moist Cardiovascular: No JVD. S1-S2 present, rhythmic, no gallops, rubs, or murmurs. No lower extremity edema. Pulmonary: vesicular breath sounds bilaterally, adequate air  movement, no wheezing, rhonchi or rales. Gastrointestinal. Abdomen flat, no organomegaly, non tender, no rebound or guarding Skin. No rashes/ improved rash on her left hand.  Musculoskeletal: no joint deformities     Data Reviewed: I have personally reviewed following labs and imaging studies  CBC: Recent Labs  Lab 02/13/17 1604 02/13/17 2136 02/15/17 0247 02/16/17 0252 02/17/17 0714 02/18/17 0503  WBC 14.4* 11.0* 15.1* 14.3* 14.4* 14.5*  NEUTROABS 12.4*  --   --   --   --   --   HGB 11.5* 8.7* 7.9* 7.7* 7.4* 7.0*  HCT 33.1* 24.6* 22.2* 21.6* 20.8* 20.3*  MCV 84.4 83.1 84.7 84.7 83.9 83.5  PLT 109* 71* 77* 92* 174 281   Basic Metabolic Panel: Recent Labs  Lab 02/14/17 0305 02/15/17 0247 02/16/17 0252 02/17/17 0714 02/18/17 0503  NA 134* 133* 136 134* 135  K 3.2* 3.1* 4.5 5.0 5.0  CL 100* 98* 103 100* 100*  CO2 25 24 24  20* 22  GLUCOSE 95 126* 92 121* 79  BUN 10 8 11 9 10   CREATININE 0.76 0.88 0.76 0.96 1.06*  CALCIUM 7.7* 8.0* 7.9* 8.0* 8.2*  MG  --  2.0  --   --   --    GFR: Estimated Creatinine Clearance: 83.2 mL/min (A) (by C-G formula based on SCr of 1.06 mg/dL (H)). Liver Function Tests: Recent Labs  Lab 02/13/17 1604 02/14/17 0305 02/15/17 0247 02/16/17 0252 02/18/17 0503  AST 52* 40 47* 49* 50*  ALT 39 29 27 22 22   ALKPHOS 177* 132* 153* 186* 173*  BILITOT 1.9* 2.2* 2.0* 2.1* 1.4*  PROT 7.0 5.4* 5.3* 5.1* 6.1*  ALBUMIN 2.5* 1.8* 1.6* 1.5* 1.6*   No results for input(s): LIPASE, AMYLASE in the last 168 hours. No results for input(s): AMMONIA in the last 168 hours. Coagulation Profile: Recent Labs  Lab 02/13/17 1604  INR 1.08   Cardiac Enzymes: Recent Labs  Lab 02/13/17 1701 02/13/17 2136 02/14/17 0305  TROPONINI <0.03 <0.03 <0.03   BNP (last 3 results) No results for input(s): PROBNP in the last 8760 hours. HbA1C: No results for input(s): HGBA1C in the last 72 hours. CBG: No results for input(s): GLUCAP in the last 168  hours. Lipid Profile: No results for input(s): CHOL, HDL, LDLCALC, TRIG, CHOLHDL, LDLDIRECT in the last 72 hours. Thyroid Function Tests: No results for input(s): TSH, T4TOTAL, FREET4, T3FREE, THYROIDAB in the last 72 hours. Anemia Panel: Recent Labs    02/17/17 0714  VITAMINB12 376  FOLATE 8.9  FERRITIN 508*  TIBC 158*  IRON 14*  RETICCTPCT 0.6      Radiology Studies: I have reviewed all of the imaging during this hospital visit personally     Scheduled Meds: . buprenorphine-naloxone  2 tablet Sublingual Daily  . DULoxetine  60 mg Oral BID  . enoxaparin (LOVENOX) injection  40 mg Subcutaneous Q24H  . feeding supplement  1 Container Oral TID BM  . linezolid  600 mg Oral Q12H  . multivitamin with minerals  1 tablet Oral  Daily  . OLANZapine  5 mg Oral q morning - 10a  . polyethylene glycol  17 g Oral Daily  . senna-docusate  1 tablet Oral BID   Continuous Infusions:   LOS: 5 days        Candise Crabtree Annett Gula, MD [facility] Pager [phone]

## 2017-02-18 NOTE — Progress Notes (Signed)
Physical Therapy Treatment Patient Details Name: Tammy ArabCasey M [name] MRN: [id number] DOB: 03/07/1985 Today's Date: 02/18/2017    History of Present Illness Pt is a 32 y.o. female, with past medical history significant for fibromyalgia, anemia and history of IV drug abuse. She presented to the ED with a few days history of fever, chills, and generalized weakness muscle aches in addition to arthralgias.  She was admitted with diagnosis of sepsis.     PT Comments    Pt is making slow progress towards her goals limited in her mobility mainly by B LE pain. Pt is supervision for bed mobility, min A for transfers and ambulation of 5 feet. D/c plans remain appropriate. PT will follow acutely.  Follow Up Recommendations  CIR     Equipment Recommendations  Rolling walker with 5" wheels    Recommendations for Other Services Rehab consult     Precautions / Restrictions Precautions Precautions: Fall Restrictions Weight Bearing Restrictions: No    Mobility  Bed Mobility Overal bed mobility: Needs Assistance Bed Mobility: Supine to Sit     Supine to sit: HOB elevated;Supervision     General bed mobility comments: supervision requires increased time and effort  Transfers Overall transfer level: Needs assistance Equipment used: Rolling walker (2 wheeled) Transfers: Sit to/from [employer];Stand Pivot Transfers Sit to Stand: Min assist Stand pivot transfers: Min assist       General transfer comment: increased time to complete, assist to power up, steadying with RW to [facility]  Ambulation/Gait Ambulation/Gait assistance: Min guard Ambulation Distance (Feet): 5 Feet Assistive device: Rolling walker (2 wheeled) Gait Pattern/deviations: Step-through pattern;Decreased stride length Gait velocity: decreased Gait velocity interpretation: Below normal speed for age/gender General Gait Details: slow, antalgic steps to head of bed from [facility]      Balance Overall balance assessment: Needs  assistance Sitting-balance support: No upper extremity supported;Feet supported Sitting balance-Leahy Scale: Good     Standing balance support: Bilateral upper extremity supported;During functional activity Standing balance-Leahy Scale: Fair Standing balance comment: heavy reliance on RW                            Cognition Arousal/Alertness: Awake/alert Behavior During Therapy: WFL for tasks assessed/performed Overall Cognitive Status: Within Functional Limits for tasks assessed                                           General Comments General comments (skin integrity, edema, etc.): HR max with movement 118 bpm      Pertinent Vitals/Pain Pain Assessment: 0-10 Pain Score: 8  Pain Location: R clavicle, bilateral LE Pain Descriptors / Indicators: Sore;Guarding;Grimacing Pain Intervention(s): Monitored during session;Limited activity within patient's tolerance;Repositioned           PT Goals (current goals can now be found in the care plan section) Acute Rehab PT Goals Patient Stated Goal: get better PT Goal Formulation: With patient Time For Goal Achievement: 03/03/17 Potential to Achieve Goals: Good    Frequency    Min 3X/week      PT Plan      Co-evaluation              AM-PAC PT "6 Clicks" Daily Activity  Outcome Measure  Difficulty turning over in bed (including adjusting bedclothes, sheets and blankets)?: A Lot Difficulty moving from lying on back to sitting on  the side of the bed? : A Lot Difficulty sitting down on and standing up from a chair with arms (e.g., wheelchair, bedside commode, etc,.)?: A Lot Help needed moving to and from a bed to chair (including a wheelchair)?: A Little Help needed walking in hospital room?: A Little Help needed climbing 3-5 steps with a railing? : A Lot 6 Click Score: 14    End of Session Equipment Utilized During Treatment: Gait belt Activity Tolerance: Patient tolerated treatment  well Patient left: in bed;with call bell/phone within reach Nurse Communication: Mobility status PT Visit Diagnosis: Muscle weakness (generalized) (M62.81);Difficulty in walking, not elsewhere classified (R26.2);Pain Pain - Right/Left: Right Pain - part of body: Hip;Shoulder     Time: 0272-5366 PT Time Calculation (min) (ACUTE ONLY): 26 min  Charges:  $Gait Training: 8-22 mins $Therapeutic Activity: 8-22 mins                    G Codes:       Meral Geissinger B. Beverely Risen PT, DPT Acute Rehabilitation  [phone] Pager [phone]     [facility] 02/18/2017, 4:43 PM

## 2017-02-18 NOTE — Progress Notes (Addendum)
    [facility] has been requested to perform a transesophageal echocardiogram on this patient to r/o endocarditis. After careful review of history and examination, the risks and benefits of transesophageal echocardiogram have been explained including risks of esophageal damage, perforation (1:10,000 risk), bleeding, pharyngeal hematoma as well as other potential complications associated with conscious sedation including aspiration, arrhythmia, respiratory failure and death. Risks/benefits/alternatives discussed. Patient did not have any questions about procedure. Denies any h/o dysphagia. She is willing to proceed. Scheduled tomorrow at 9am w/ anesthesia.  Laurann Montanaayna N Dunn, PA-C 02/18/2017 2:43 PM

## 2017-02-18 NOTE — Progress Notes (Signed)
PROGRESS NOTE  [name] ZOX:[id number] DOB: 03/06/1985 DOA: 02/13/2017 PCP: Pearson GrippeKim, James, MD   LOS: 5 days   Brief Narrative / Interim history: 32 y.o. Female with history of fibromyalgia, tobacco abuse, opiate addiction, IBS, PTSDA anemia and history of IV drug abuse presented to the ED with 2 days of fever, chills and generalized weakness for the last week accompanied by pain in her left hand, right shoulder and legs. She also noted swelling and redness on both lower extremities. She had been sick with a cold the week prior. No headache, vision changes, URI symptoms, chest pain, cough, dysuria. B/P 98/69, pulse 132, temp 101.9, resp 22, O2 98%. WBC 11.0, HGM 11.5, platelets 109, potassium 3.2, Lactic acid 2.29, HcG 16.1. CXR revealed bibasila opacities, likely atelectasis with no effusions. She was started on IV vanc and zosyn started. Blood cultures sent.  She was admitted for working diagnosis of sepsis due to cellulitis left hand  1/13 blood cultures positive for staph aureaus 2/1 MRI of lumbar spine unremarkable 2/1 US doppler LE revealed no evidence of DVT 2/1 TTE EF 60-65%, Grade 1 DD, no evidence of vegetation 2/4 MRI cervical/thoracic spine unremarkable  Assessment & Plan: Active Problems:   Sepsis affecting skin (HCC)   Pelvic pain   Cellulitis of left hand   Staphylococcus aureus bacteremia with sepsis (HCC)   Weakness of both lower extremities   Pain of right clavicle   IVDU (intravenous drug user)   Hepatitis C antibody positive in blood   Sepsis with Staph Aureus bacteremia due to Left hand cellulitis Upon admission patient had localized pan with swelling/redness of 2nd MCP joint. She was febrile 101.9, resp 22, pulse 132. Started on vanc/zosyn. Patient reports no injuries to the hand.  TTE with no evidence of vegetation   WBC not trending down, today 14.5. ID consulted, changed to high dose bactrim. Patient will need outpatient abx after blood cultures are  clear. Today patient is feeling better, swelling is minimal with some erythema localized to MCP joint Continue to check CBC and blood cultures  Bilateral LE weakness/redness and swelling Unclear etiology. US doppler revealed no evidence of DVT. MRI of spine unremarkable Symptoms improving, no swelling or redness Patient still with leg weakness.  PT recommend SNF  Anemia, chronic Possible due to poor nutrition complicated by IV drug use Baseline hemoglobin unsure, 8 months prior 14.2  In ED HGM 8.7 and has since decreased to 7.0 Monitor HGM ?transfuse if >7.0 Receiving BOOST shakes Give oral Iron  Acute kidney injury Increased to 1.06 from 0.76 Monitor Cr Avoid nephrotoxins  IV drug abuse/tobacco abuse Suboxone for 1.5 years for heroine and opiate addiction No signs of withdrawal Last drug use was 3 weeks ago, Meth. Smokes 1-1.5 ppd Continue suboxine    DVT prophylaxis: lovenox Code Status: FULL Family Communication: none Disposition Plan: SNF  Consultants:   ID  Procedures:   TEE    Antimicrobials:  Bactrim  Subjective: Patient was alert sitting in bed. She states she is feeling much better, legs look normal. Improved left shoulder pain and back pain. Still has minimal redness and swelling in left hand.   Objective: Vitals:   02/17/17 1300 02/17/17 1500 02/17/17 2141 02/18/17 0612  BP: 105/71 (!) 97/49 101/72 (!) 94/56  Pulse: (!) 125 97 (!) 112 (!) 108  Resp: (!) 24 (!) 24 18 20   Temp: 99.2 F (37.3 C) 100 F (37.8 C) 98.8 F (37.1 C) 99 F (37.2 C)  TempSrc:  Oral Oral Oral   SpO2: 95% 91% 93% 92%  Weight:  80.7 kg (177 lb 14.6 oz)    Height:  5\' 7"  (1.702 m)      Intake/Output Summary (Last 24 hours) at 02/18/2017 1038 Last data filed at 02/18/2017 9562 Gross per 24 hour  Intake -  Output 605 ml  Net -605 ml   Filed Weights   02/13/17 2120 02/17/17 1500  Weight: 76.5 kg (168 lb 10.4 oz) 80.7 kg (177 lb 14.6 oz)     Examination:  Constitutional: NAD, pale Eyes: PERRL, lids and conjunctivae normal ENMT: Mucous membranes are moist. No oropharyngeal exudates Neck: normal, supple Respiratory: clear to auscultation bilaterally, no wheezing, no crackles. Normal respiratory effort. No accessory muscle use.  Cardiovascular: Regular rate and rhythm, no murmurs / rubs / gallops. No LE edema. 2+ pedal pulses.  Abdomen: no tenderness. Bowel sounds positive.  Musculoskeletal:  No joint deformity upper and lower extremities. No contractures. Normal muscle tone.  Skin: mild erythema and swelling MCP joint, left hand.  Neurologic: CN 2-12 grossly intact.  Psychiatric: Normal judgment and insight. Alert and oriented x 3. Normal mood.    Data Reviewed: I have independently reviewed following labs and imaging studies   CBC: Recent Labs  Lab 02/13/17 1604 02/13/17 2136 02/15/17 0247 02/16/17 0252 02/17/17 0714 02/18/17 0503  WBC 14.4* 11.0* 15.1* 14.3* 14.4* 14.5*  NEUTROABS 12.4*  --   --   --   --   --   HGB 11.5* 8.7* 7.9* 7.7* 7.4* 7.0*  HCT 33.1* 24.6* 22.2* 21.6* 20.8* 20.3*  MCV 84.4 83.1 84.7 84.7 83.9 83.5  PLT 109* 71* 77* 92* 174 281   Basic Metabolic Panel: Recent Labs  Lab 02/14/17 0305 02/15/17 0247 02/16/17 0252 02/17/17 0714 02/18/17 0503  NA 134* 133* 136 134* 135  K 3.2* 3.1* 4.5 5.0 5.0  CL 100* 98* 103 100* 100*  CO2 25 24 24  20* 22  GLUCOSE 95 126* 92 121* 79  BUN 10 8 11 9 10   CREATININE 0.76 0.88 0.76 0.96 1.06*  CALCIUM 7.7* 8.0* 7.9* 8.0* 8.2*  MG  --  2.0  --   --   --    GFR: Estimated Creatinine Clearance: 83.2 mL/min (A) (by C-G formula based on SCr of 1.06 mg/dL (H)). Liver Function Tests: Recent Labs  Lab 02/13/17 1604 02/14/17 0305 02/15/17 0247 02/16/17 0252 02/18/17 0503  AST 52* 40 47* 49* 50*  ALT 39 29 27 22 22   ALKPHOS 177* 132* 153* 186* 173*  BILITOT 1.9* 2.2* 2.0* 2.1* 1.4*  PROT 7.0 5.4* 5.3* 5.1* 6.1*  ALBUMIN 2.5* 1.8* 1.6* 1.5* 1.6*    No results for input(s): LIPASE, AMYLASE in the last 168 hours. No results for input(s): AMMONIA in the last 168 hours. Coagulation Profile: Recent Labs  Lab 02/13/17 1604  INR 1.08   Cardiac Enzymes: Recent Labs  Lab 02/13/17 1701 02/13/17 2136 02/14/17 0305  TROPONINI <0.03 <0.03 <0.03   BNP (last 3 results) No results for input(s): PROBNP in the last 8760 hours. HbA1C: No results for input(s): HGBA1C in the last 72 hours. CBG: No results for input(s): GLUCAP in the last 168 hours. Lipid Profile: No results for input(s): CHOL, HDL, LDLCALC, TRIG, CHOLHDL, LDLDIRECT in the last 72 hours. Thyroid Function Tests: No results for input(s): TSH, T4TOTAL, FREET4, T3FREE, THYROIDAB in the last 72 hours. Anemia Panel: Recent Labs    02/17/17 0714  VITAMINB12 376  FOLATE 8.9  FERRITIN 508*  TIBC 158*  IRON 14*  RETICCTPCT 0.6   Urine analysis:    Component Value Date/Time   COLORURINE AMBER (A) 02/13/2017 0436   APPEARANCEUR HAZY (A) 02/13/2017 0436   LABSPEC 1.014 02/13/2017 0436   PHURINE 5.0 02/13/2017 0436   GLUCOSEU NEGATIVE 02/13/2017 0436   HGBUR LARGE (A) 02/13/2017 0436   BILIRUBINUR NEGATIVE 02/13/2017 0436   KETONESUR NEGATIVE 02/13/2017 0436   PROTEINUR NEGATIVE 02/13/2017 0436   UROBILINOGEN 0.2 12/07/2012 1510   NITRITE POSITIVE (A) 02/13/2017 0436   LEUKOCYTESUR TRACE (A) 02/13/2017 0436   Sepsis Labs: Invalid input(s): PROCALCITONIN, LACTICIDVEN  Recent Results (from the past 240 hour(s))  Culture, blood (Routine x 2)     Status: Abnormal   Collection Time: 02/13/17  4:00 PM  Result Value Ref Range Status   Specimen Description BLOOD RIGHT ANTECUBITAL  Final   Special Requests IN PEDIATRIC BOTTLE Blood Culture adequate volume  Final   Culture  Setup Time   Final    GRAM POSITIVE COCCI IN PEDIATRIC BOTTLE CRITICAL RESULT CALLED TO, READ BACK BY AND VERIFIED WITHMelven Sartorius [facility] 1610 02/14/17 A BROWNING Performed at [facility],  [address] [place], [place] [address]    Culture STAPHYLOCOCCUS AUREUS (A)  Final   Report Status 02/17/2017 FINAL  Final   Organism ID, Bacteria STAPHYLOCOCCUS AUREUS  Final      Susceptibility   Staphylococcus aureus - MIC*    CIPROFLOXACIN <=0.5 SENSITIVE Sensitive     ERYTHROMYCIN <=0.25 SENSITIVE Sensitive     GENTAMICIN <=0.5 SENSITIVE Sensitive     OXACILLIN <=0.25 SENSITIVE Sensitive     TETRACYCLINE >=16 RESISTANT Resistant     VANCOMYCIN 1 SENSITIVE Sensitive     TRIMETH/SULFA <=10 SENSITIVE Sensitive     CLINDAMYCIN <=0.25 SENSITIVE Sensitive     RIFAMPIN <=0.5 SENSITIVE Sensitive     Inducible Clindamycin NEGATIVE Sensitive     * STAPHYLOCOCCUS AUREUS  Blood Culture ID Panel (Reflexed)     Status: Abnormal   Collection Time: 02/13/17  4:00 PM  Result Value Ref Range Status   Enterococcus species NOT DETECTED NOT DETECTED Final   Listeria monocytogenes NOT DETECTED NOT DETECTED Final   Staphylococcus species DETECTED (A) NOT DETECTED Final    Comment: CRITICAL RESULT CALLED TO, READ BACK BY AND VERIFIED WITH: [facility] PHARMD [id number] 02/14/17 A BROWNING    Staphylococcus aureus DETECTED (A) NOT DETECTED Final    Comment: Methicillin (oxacillin) susceptible Staphylococcus aureus (MSSA). Preferred therapy is anti staphylococcal beta lactam antibiotic (Cefazolin or Nafcillin), unless clinically contraindicated. CRITICAL RESULT CALLED TO, READ BACK BY AND VERIFIED WITH: Melven Sartorius PHARMD [id number] 02/14/17 A BROWNING    Methicillin resistance NOT DETECTED NOT DETECTED Final   Streptococcus species NOT DETECTED NOT DETECTED Final   Streptococcus agalactiae NOT DETECTED NOT DETECTED Final   Streptococcus pneumoniae NOT DETECTED NOT DETECTED Final   Streptococcus pyogenes NOT DETECTED NOT DETECTED Final   Acinetobacter baumannii NOT DETECTED NOT DETECTED Final   Enterobacteriaceae species NOT DETECTED NOT DETECTED Final   Enterobacter cloacae complex NOT DETECTED NOT DETECTED Final    Escherichia coli NOT DETECTED NOT DETECTED Final   Klebsiella oxytoca NOT DETECTED NOT DETECTED Final   Klebsiella pneumoniae NOT DETECTED NOT DETECTED Final   Proteus species NOT DETECTED NOT DETECTED Final   Serratia marcescens NOT DETECTED NOT DETECTED Final   Haemophilus influenzae NOT DETECTED NOT DETECTED Final   Neisseria meningitidis NOT DETECTED NOT DETECTED Final   Pseudomonas aeruginosa NOT  DETECTED NOT DETECTED Final   Candida albicans NOT DETECTED NOT DETECTED Final   Candida glabrata NOT DETECTED NOT DETECTED Final   Candida krusei NOT DETECTED NOT DETECTED Final   Candida parapsilosis NOT DETECTED NOT DETECTED Final   Candida tropicalis NOT DETECTED NOT DETECTED Final    Comment: Performed at [facility], [address], [place], [place] [address]  Culture, blood (Routine x 2)     Status: Abnormal   Collection Time: 02/13/17  5:00 PM  Result Value Ref Range Status   Specimen Description BLOOD RIGHT ANTECUBITAL  Final   Special Requests   Final    BOTTLES DRAWN AEROBIC AND ANAEROBIC Blood Culture adequate volume   Culture  Setup Time   Final    GRAM POSITIVE COCCI IN CLUSTERS IN BOTH AEROBIC AND ANAEROBIC BOTTLES CRITICAL VALUE NOTED.  VALUE IS CONSISTENT WITH PREVIOUSLY REPORTED AND CALLED VALUE.    Culture (A)  Final    STAPHYLOCOCCUS AUREUS SUSCEPTIBILITIES PERFORMED ON PREVIOUS CULTURE WITHIN THE LAST 5 DAYS. Performed at [facility], [address] [place], [place] [address]    Report Status 02/16/2017 FINAL  Final  MRSA PCR Screening     Status: None   Collection Time: 02/13/17  9:32 PM  Result Value Ref Range Status   MRSA by PCR NEGATIVE NEGATIVE Final    Comment:        The GeneXpert MRSA Assay (FDA approved for NASAL specimens only), is one component of a comprehensive MRSA colonization surveillance program. It is not intended to diagnose MRSA infection nor to guide or monitor treatment for MRSA infections.   Culture, blood (Routine  X 2) w Reflex to ID Panel     Status: Abnormal   Collection Time: 02/14/17 10:07 AM  Result Value Ref Range Status   Specimen Description BLOOD RIGHT HAND  Final   Special Requests IN PEDIATRIC BOTTLE Blood Culture adequate volume  Final   Culture  Setup Time   Final    GRAM POSITIVE COCCI IN PEDIATRIC BOTTLE CRITICAL VALUE NOTED.  VALUE IS CONSISTENT WITH PREVIOUSLY REPORTED AND CALLED VALUE.    Culture (A)  Final    STAPHYLOCOCCUS AUREUS SUSCEPTIBILITIES PERFORMED ON PREVIOUS CULTURE WITHIN THE LAST 5 DAYS. Performed at [facility], [address] [place], [place] [address]    Report Status 02/17/2017 FINAL  Final  Culture, blood (Routine X 2) w Reflex to ID Panel     Status: None (Preliminary result)   Collection Time: 02/17/17 10:10 AM  Result Value Ref Range Status   Specimen Description BLOOD RIGHT HAND  Final   Special Requests IN PEDIATRIC BOTTLE Blood Culture adequate volume  Final   Culture  Setup Time   Final    GRAM POSITIVE COCCI IN PEDIATRIC BOTTLE CRITICAL VALUE NOTED.  VALUE IS CONSISTENT WITH PREVIOUSLY REPORTED AND CALLED VALUE. Performed at [facility], [address], [place], [place] [address]    Culture GRAM POSITIVE COCCI  Final   Report Status PENDING  Incomplete      Radiology Studies: Mr Cervical Spine Wo Contrast  Result Date: 02/17/2017 CLINICAL DATA:  Lower extremity weakness. Bacteremia, fever, leukocytosis. History of intravenous drug abuse. EXAM: MRI CERVICAL AND THORACIC SPINE WITHOUT CONTRAST TECHNIQUE: Multiplanar and multiecho pulse sequences of the cervical spine, to include the craniocervical junction and cervicothoracic junction, and thoracic spine, were obtained without intravenous contrast. Intravenous access not successfully gained, no contrast administered. COMPARISON:  MRI of the  lumbar spine February 14, 2017 FINDINGS: MRI CERVICAL SPINE FINDINGS-moderately motion degraded examination. ALIGNMENT: Straightened cervical  lordosis.  No malalignment. VERTEBRAE/DISCS: Vertebral bodies are intact. Intervertebral disc morphology's and signal are normal. CORD:Cervical spinal cord is normal morphology and signal characteristics from the cervicomedullary junction to level of T1-2, the most caudal well visualized level. POSTERIOR FOSSA, VERTEBRAL ARTERIES, PARASPINAL TISSUES: No MR findings of ligamentous injury. Vertebral artery flow voids present. Cerebellar tonsils descend slightly below the foramen magnum though, are not pointed in appearance and, do not reach criteria fecal ERA 1 malformation. DISC LEVELS: No disc bulge, canal stenosis nor neural foraminal narrowing. MRI THORACIC  SPINE FINDINGS-Mild motion degraded examination. ALIGNMENT: Maintenance of the thoracic kyphosis. No malalignment. VERTEBRAE/DISCS: Vertebral bodies are intact. Intervertebral discs morphology and signal are normal.12 mm T4 hemangioma. No suspicious or acute bone marrow signal. CORD: Thoracic spinal cord is normal morphology and signal characteristics to the level of the conus medullaris which terminates at T12-L1. No epidural collection by noncontrast imaging. PREVERTEBRAL AND PARASPINAL SOFT TISSUES: Small RIGHT pleural effusion. No paraspinal fluid collection by noncontrast MRI. DISC LEVELS: No disc bulge, canal stenosis nor neural foraminal narrowing. IMPRESSION: 1. Negative moderately motion degraded noncontrast MRI of the cervical spine. 2. Negative mildly motion degraded noncontrast MRI of the thoracic spine. 3. Small RIGHT pleural effusion. Electronically Signed   By: Awilda Metro M.D.   On: 02/17/2017 04:44   Mr Thoracic Spine Wo Contrast  Result Date: 02/17/2017 CLINICAL DATA:  Lower extremity weakness. Bacteremia, fever, leukocytosis. History of intravenous drug abuse. EXAM: MRI CERVICAL AND THORACIC SPINE WITHOUT CONTRAST TECHNIQUE: Multiplanar and multiecho pulse sequences of the cervical spine, to include the craniocervical junction and  cervicothoracic junction, and thoracic spine, were obtained without intravenous contrast. Intravenous access not successfully gained, no contrast administered. COMPARISON:  MRI of the lumbar spine February 14, 2017 FINDINGS: MRI CERVICAL SPINE FINDINGS-moderately motion degraded examination. ALIGNMENT: Straightened cervical lordosis.  No malalignment. VERTEBRAE/DISCS: Vertebral bodies are intact. Intervertebral disc morphology's and signal are normal. CORD:Cervical spinal cord is normal morphology and signal characteristics from the cervicomedullary junction to level of T1-2, the most caudal well visualized level. POSTERIOR FOSSA, VERTEBRAL ARTERIES, PARASPINAL TISSUES: No MR findings of ligamentous injury. Vertebral artery flow voids present. Cerebellar tonsils descend slightly below the foramen magnum though, are not pointed in appearance and, do not reach criteria fecal ERA 1 malformation. DISC LEVELS: No disc bulge, canal stenosis nor neural foraminal narrowing. MRI THORACIC  SPINE FINDINGS-Mild motion degraded examination. ALIGNMENT: Maintenance of the thoracic kyphosis. No malalignment. VERTEBRAE/DISCS: Vertebral bodies are intact. Intervertebral discs morphology and signal are normal.12 mm T4 hemangioma. No suspicious or acute bone marrow signal. CORD: Thoracic spinal cord is normal morphology and signal characteristics to the level of the conus medullaris which terminates at T12-L1. No epidural collection by noncontrast imaging. PREVERTEBRAL AND PARASPINAL SOFT TISSUES: Small RIGHT pleural effusion. No paraspinal fluid collection by noncontrast MRI. DISC LEVELS: No disc bulge, canal stenosis nor neural foraminal narrowing. IMPRESSION: 1. Negative moderately motion degraded noncontrast MRI of the cervical spine. 2. Negative mildly motion degraded noncontrast MRI of the thoracic spine. 3. Small RIGHT pleural effusion. Electronically Signed   By: Awilda Metro M.D.   On: 02/17/2017 04:44     Scheduled  Meds: . buprenorphine-naloxone  2 tablet Sublingual Daily  . DULoxetine  60 mg Oral BID  . enoxaparin (LOVENOX) injection  40 mg Subcutaneous Q24H  . feeding supplement  1 Container Oral TID BM  . multivitamin with  minerals  1 tablet Oral Daily  . OLANZapine  5 mg Oral q morning - 10a  . polyethylene glycol  17 g Oral Daily  . senna-docusate  1 tablet Oral BID  . sulfamethoxazole-trimethoprim  2 tablet Oral Q12H   Continuous Infusions:     Time spent:     October Peery, PA-S

## 2017-02-19 ENCOUNTER — Other Ambulatory Visit (HOSPITAL_COMMUNITY): Payer: Medicaid Other

## 2017-02-19 ENCOUNTER — Encounter (HOSPITAL_COMMUNITY)
Admission: EM | Disposition: A | Discharge status: Home / Self Care | Payer: Self-pay | Source: Home / Self Care | Attending: Internal Medicine

## 2017-02-19 DIAGNOSIS — R5381 Other malaise: Secondary | ICD-10-CM

## 2017-02-19 DIAGNOSIS — M25511 Pain in right shoulder: Secondary | ICD-10-CM

## 2017-02-19 DIAGNOSIS — L03114 Cellulitis of left upper limb: Secondary | ICD-10-CM

## 2017-02-19 DIAGNOSIS — R21 Rash and other nonspecific skin eruption: Secondary | ICD-10-CM

## 2017-02-19 LAB — BASIC METABOLIC PANEL
ANION GAP: 15 (ref 5–15)
BUN: 13 mg/dL (ref 6–20)
CALCIUM: 8.7 mg/dL — AB (ref 8.9–10.3)
CO2: 18 mmol/L — AB (ref 22–32)
Chloride: 102 mmol/L (ref 101–111)
Creatinine, Ser: 1 mg/dL (ref 0.44–1.00)
Glucose, Bld: 74 mg/dL (ref 65–99)
Potassium: 6.3 mmol/L (ref 3.5–5.1)
Sodium: 135 mmol/L (ref 135–145)

## 2017-02-19 LAB — CBC WITH DIFFERENTIAL/PLATELET
Basophils Absolute: 0.1 10*3/uL (ref 0.0–0.1)
Basophils Relative: 1 %
EOS PCT: 1 %
Eosinophils Absolute: 0.1 10*3/uL (ref 0.0–0.7)
HEMATOCRIT: 21.4 % — AB (ref 36.0–46.0)
Hemoglobin: 7.1 g/dL — ABNORMAL LOW (ref 12.0–15.0)
LYMPHS PCT: 25 %
Lymphs Abs: 3.3 10*3/uL (ref 0.7–4.0)
MCH: 28 pg (ref 26.0–34.0)
MCHC: 33.2 g/dL (ref 30.0–36.0)
MCV: 84.3 fL (ref 78.0–100.0)
Monocytes Absolute: 0.8 10*3/uL (ref 0.1–1.0)
Monocytes Relative: 6 %
NEUTROS PCT: 67 %
Neutro Abs: 8.9 10*3/uL — ABNORMAL HIGH (ref 1.7–7.7)
PLATELETS: 340 10*3/uL (ref 150–400)
RBC: 2.54 MIL/uL — AB (ref 3.87–5.11)
RDW: 14.6 % (ref 11.5–15.5)
WBC: 13.2 10*3/uL — AB (ref 4.0–10.5)

## 2017-02-19 LAB — CULTURE, BLOOD (ROUTINE X 2): SPECIAL REQUESTS: ADEQUATE

## 2017-02-19 LAB — POTASSIUM: Potassium: 5.2 mmol/L — ABNORMAL HIGH (ref 3.5–5.1)

## 2017-02-19 SURGERY — ECHOCARDIOGRAM, TRANSESOPHAGEAL
Anesthesia: Monitor Anesthesia Care

## 2017-02-19 MED ORDER — IOPAMIDOL (ISOVUE-300) INJECTION 61%
INTRAVENOUS | Status: AC
  Administered 2017-02-20: 75 mL
  Filled 2017-02-19: qty 75

## 2017-02-19 MED ORDER — SODIUM POLYSTYRENE SULFONATE 15 GM/60ML PO SUSP
30.0000 g | Freq: Once | ORAL | Status: DC
  Filled 2017-02-19: qty 120

## 2017-02-19 MED ORDER — SODIUM POLYSTYRENE SULFONATE PO POWD
30.0000 g | Freq: Once | ORAL | Status: AC
  Administered 2017-02-19: 30 g via ORAL
  Filled 2017-02-19: qty 30

## 2017-02-19 MED ORDER — CEFAZOLIN SODIUM-DEXTROSE 2-4 GM/100ML-% IV SOLN
2.0000 g | Freq: Three times a day (TID) | INTRAVENOUS | Status: DC
  Administered 2017-02-19 – 2017-02-21 (×7): 2 g via INTRAVENOUS
  Filled 2017-02-19 (×8): qty 100

## 2017-02-19 NOTE — H&P (View-Only) (Signed)
PROGRESS NOTE  Tammy ArabCasey M [name] WUJ:[id number] DOB: 07/16/1985 DOA: 02/13/2017 PCP: Pearson GrippeKim, James, MD  HPI/Recap of past 4524 hours: 32 year old female who presented with a chief complaint weakness.  She does have a significant past medical history for fibromyalgia, chronic anemia and history intravenous drug abuse. Complaint of fever, chills, generalized weakness for the last 2 days prior to hospitalization.  She had erythema and petechiae on her left hand and bilateral lower extremities. Patient was admitted to the hospital with diagnosis of sepsis, due to left hand cellulitis, rule out endocarditis.  Today, pt denies any new complaints. Denies any chest pain, abdominal pain, SOB, fever/chills.  Assessment/Plan: Active Problems:   Sepsis affecting skin (HCC)   Pelvic pain   Cellulitis of left hand   Staphylococcus aureus bacteremia with sepsis (HCC)   Weakness of both lower extremities   Pain of right clavicle   IVDU (intravenous drug user)   Hepatitis C antibody positive in blood  Staphylococcus aureus bacteremia, complicated with sepsis Afebrile with leukocytosis BC positive with staph aureus, repeat pending Continue IV Cefazolin ID on board For TEE on 02/20/17  Acute on chronic anemia of iron deficiency Hgb 7.1, iron panel with iron 14, sats 9% Type and screen pending, if <7 will transfuse Continue iron supplements and prophylactic bowel regimen  Urinary tract infection Completed antibiotic therapy.   Hyperkalemia 6.3-->5.2 S/p kayexalate 30g Daily BMET  Thrombocytopenia Resolved  IV drug abuse No signs of withdrawal, will continue neuro checks per unit protocol As needed alprazolam, continue suboxone.   Depression Continue cymbalta and olanzapine   Code Status: Full  Family Communication: None at bedside  Disposition Plan: Home   Consultants:  ID  CIR  Procedures:  None  Antimicrobials:  IV Cefazolin   DVT prophylaxis:   Lovenox   Objective: Vitals:   02/18/17 2102 02/18/17 2102 02/19/17 0518 02/19/17 1434  BP: (!) 98/59 (!) 98/59 106/70 (!) 99/58  Pulse: (!) 101 (!) 101 95 91  Resp: 18 18 18 18   Temp: 98.6 F (37 C) 98.6 F (37 C) 98.3 F (36.8 C)   TempSrc: Oral     SpO2: 96% 96% 96% 92%  Weight:      Height:        Intake/Output Summary (Last 24 hours) at 02/19/2017 1835 Last data filed at 02/19/2017 1522 Gross per 24 hour  Intake 100 ml  Output -  Net 100 ml   Filed Weights   02/13/17 2120 02/17/17 1500  Weight: 76.5 kg (168 lb 10.4 oz) 80.7 kg (177 lb 14.6 oz)    Exam:   General:  Alert, awake, oriented, deconditioned  Cardiovascular: S1-S2 present, no added heart sounds  Respiratory: Chest clear bilaterally  Abdomen: Soft, nontender, nondistended, bowel sounds present  Musculoskeletal: No pedal edema bilaterally  Skin: Improved rash on the left hand  Psychiatry: Normal mood   Data Reviewed: CBC: Recent Labs  Lab 02/13/17 1604  02/15/17 0247 02/16/17 0252 02/17/17 0714 02/18/17 0503 02/19/17 0352  WBC 14.4*   < > 15.1* 14.3* 14.4* 14.5* 13.2*  NEUTROABS 12.4*  --   --   --   --   --  8.9*  HGB 11.5*   < > 7.9* 7.7* 7.4* 7.0* 7.1*  HCT 33.1*   < > 22.2* 21.6* 20.8* 20.3* 21.4*  MCV 84.4   < > 84.7 84.7 83.9 83.5 84.3  PLT 109*   < > 77* 92* 174 281 340   < > = values in this  interval not displayed.   Basic Metabolic Panel: Recent Labs  Lab 02/15/17 0247 02/16/17 0252 02/17/17 0714 02/18/17 0503 02/19/17 0352 02/19/17 0921  NA 133* 136 134* 135 135  --   K 3.1* 4.5 5.0 5.0 6.3* 5.2*  CL 98* 103 100* 100* 102  --   CO2 24 24 20* 22 18*  --   GLUCOSE 126* 92 121* 79 74  --   BUN 8 11 9 10 13   --   CREATININE 0.88 0.76 0.96 1.06* 1.00  --   CALCIUM 8.0* 7.9* 8.0* 8.2* 8.7*  --   MG 2.0  --   --   --   --   --    GFR: Estimated Creatinine Clearance: 88.2 mL/min (by C-G formula based on SCr of 1 mg/dL). Liver Function Tests: Recent Labs  Lab  02/13/17 1604 02/14/17 0305 02/15/17 0247 02/16/17 0252 02/18/17 0503  AST 52* 40 47* 49* 50*  ALT 39 29 27 22 22   ALKPHOS 177* 132* 153* 186* 173*  BILITOT 1.9* 2.2* 2.0* 2.1* 1.4*  PROT 7.0 5.4* 5.3* 5.1* 6.1*  ALBUMIN 2.5* 1.8* 1.6* 1.5* 1.6*   No results for input(s): LIPASE, AMYLASE in the last 168 hours. No results for input(s): AMMONIA in the last 168 hours. Coagulation Profile: Recent Labs  Lab 02/13/17 1604  INR 1.08   Cardiac Enzymes: Recent Labs  Lab 02/13/17 1701 02/13/17 2136 02/14/17 0305  TROPONINI <0.03 <0.03 <0.03   BNP (last 3 results) No results for input(s): PROBNP in the last 8760 hours. HbA1C: No results for input(s): HGBA1C in the last 72 hours. CBG: No results for input(s): GLUCAP in the last 168 hours. Lipid Profile: No results for input(s): CHOL, HDL, LDLCALC, TRIG, CHOLHDL, LDLDIRECT in the last 72 hours. Thyroid Function Tests: No results for input(s): TSH, T4TOTAL, FREET4, T3FREE, THYROIDAB in the last 72 hours. Anemia Panel: Recent Labs    02/17/17 0714  VITAMINB12 376  FOLATE 8.9  FERRITIN 508*  TIBC 158*  IRON 14*  RETICCTPCT 0.6   Urine analysis:    Component Value Date/Time   COLORURINE AMBER (A) 02/13/2017 0436   APPEARANCEUR HAZY (A) 02/13/2017 0436   LABSPEC 1.014 02/13/2017 0436   PHURINE 5.0 02/13/2017 0436   GLUCOSEU NEGATIVE 02/13/2017 0436   HGBUR LARGE (A) 02/13/2017 0436   BILIRUBINUR NEGATIVE 02/13/2017 0436   KETONESUR NEGATIVE 02/13/2017 0436   PROTEINUR NEGATIVE 02/13/2017 0436   UROBILINOGEN 0.2 12/07/2012 1510   NITRITE POSITIVE (A) 02/13/2017 0436   LEUKOCYTESUR TRACE (A) 02/13/2017 0436   Sepsis Labs: @LABRCNTIP (procalcitonin:4,lacticidven:4)  ) Recent Results (from the past 240 hour(s))  Culture, blood (Routine x 2)     Status: Abnormal   Collection Time: 02/13/17  4:00 PM  Result Value Ref Range Status   Specimen Description BLOOD RIGHT ANTECUBITAL  Final   Special Requests IN PEDIATRIC  BOTTLE Blood Culture adequate volume  Final   Culture  Setup Time   Final    GRAM POSITIVE COCCI IN PEDIATRIC BOTTLE CRITICAL RESULT CALLED TO, READ BACK BY AND VERIFIED WITHMelven Sartorius [facility] 1610 02/14/17 A BROWNING Performed at [facility], [address] [place], [place] [address]    Culture STAPHYLOCOCCUS AUREUS (A)  Final   Report Status 02/17/2017 FINAL  Final   Organism ID, Bacteria STAPHYLOCOCCUS AUREUS  Final      Susceptibility   Staphylococcus aureus - MIC*    CIPROFLOXACIN <=0.5 SENSITIVE Sensitive     ERYTHROMYCIN <=0.25 SENSITIVE Sensitive  GENTAMICIN <=0.5 SENSITIVE Sensitive     OXACILLIN <=0.25 SENSITIVE Sensitive     TETRACYCLINE >=16 RESISTANT Resistant     VANCOMYCIN 1 SENSITIVE Sensitive     TRIMETH/SULFA <=10 SENSITIVE Sensitive     CLINDAMYCIN <=0.25 SENSITIVE Sensitive     RIFAMPIN <=0.5 SENSITIVE Sensitive     Inducible Clindamycin NEGATIVE Sensitive     * STAPHYLOCOCCUS AUREUS  Blood Culture ID Panel (Reflexed)     Status: Abnormal   Collection Time: 02/13/17  4:00 PM  Result Value Ref Range Status   Enterococcus species NOT DETECTED NOT DETECTED Final   Listeria monocytogenes NOT DETECTED NOT DETECTED Final   Staphylococcus species DETECTED (A) NOT DETECTED Final    Comment: CRITICAL RESULT CALLED TO, READ BACK BY AND VERIFIED WITH: J [facility] PHARMD 1610 02/14/17 A BROWNING    Staphylococcus aureus DETECTED (A) NOT DETECTED Final    Comment: Methicillin (oxacillin) susceptible Staphylococcus aureus (MSSA). Preferred therapy is anti staphylococcal beta lactam antibiotic (Cefazolin or Nafcillin), unless clinically contraindicated. CRITICAL RESULT CALLED TO, READ BACK BY AND VERIFIED WITH: Melven Sartorius PHARMD [id number] 02/14/17 A BROWNING    Methicillin resistance NOT DETECTED NOT DETECTED Final   Streptococcus species NOT DETECTED NOT DETECTED Final   Streptococcus agalactiae NOT DETECTED NOT DETECTED Final   Streptococcus pneumoniae NOT DETECTED NOT  DETECTED Final   Streptococcus pyogenes NOT DETECTED NOT DETECTED Final   Acinetobacter baumannii NOT DETECTED NOT DETECTED Final   Enterobacteriaceae species NOT DETECTED NOT DETECTED Final   Enterobacter cloacae complex NOT DETECTED NOT DETECTED Final   Escherichia coli NOT DETECTED NOT DETECTED Final   Klebsiella oxytoca NOT DETECTED NOT DETECTED Final   Klebsiella pneumoniae NOT DETECTED NOT DETECTED Final   Proteus species NOT DETECTED NOT DETECTED Final   Serratia marcescens NOT DETECTED NOT DETECTED Final   Haemophilus influenzae NOT DETECTED NOT DETECTED Final   Neisseria meningitidis NOT DETECTED NOT DETECTED Final   Pseudomonas aeruginosa NOT DETECTED NOT DETECTED Final   Candida albicans NOT DETECTED NOT DETECTED Final   Candida glabrata NOT DETECTED NOT DETECTED Final   Candida krusei NOT DETECTED NOT DETECTED Final   Candida parapsilosis NOT DETECTED NOT DETECTED Final   Candida tropicalis NOT DETECTED NOT DETECTED Final    Comment: Performed at [facility], [address] [place], [place] [address]  Culture, blood (Routine x 2)     Status: Abnormal   Collection Time: 02/13/17  5:00 PM  Result Value Ref Range Status   Specimen Description BLOOD RIGHT ANTECUBITAL  Final   Special Requests   Final    BOTTLES DRAWN AEROBIC AND ANAEROBIC Blood Culture adequate volume   Culture  Setup Time   Final    GRAM POSITIVE COCCI IN CLUSTERS IN BOTH AEROBIC AND ANAEROBIC BOTTLES CRITICAL VALUE NOTED.  VALUE IS CONSISTENT WITH PREVIOUSLY REPORTED AND CALLED VALUE.    Culture (A)  Final    STAPHYLOCOCCUS AUREUS SUSCEPTIBILITIES PERFORMED ON PREVIOUS CULTURE WITHIN THE LAST 5 DAYS. Performed at [facility], [address] [place], [place] [address]    Report Status 02/16/2017 FINAL  Final  MRSA PCR Screening     Status: None   Collection Time: 02/13/17  9:32 PM  Result Value Ref Range Status   MRSA by PCR NEGATIVE NEGATIVE Final    Comment:        The GeneXpert  MRSA Assay (FDA approved for NASAL specimens only), is one component of a comprehensive MRSA colonization surveillance program. It  is not intended to diagnose MRSA infection nor to guide or monitor treatment for MRSA infections.   Culture, blood (Routine X 2) w Reflex to ID Panel     Status: Abnormal   Collection Time: 02/14/17 10:07 AM  Result Value Ref Range Status   Specimen Description BLOOD RIGHT HAND  Final   Special Requests IN PEDIATRIC BOTTLE Blood Culture adequate volume  Final   Culture  Setup Time   Final    GRAM POSITIVE COCCI IN PEDIATRIC BOTTLE CRITICAL VALUE NOTED.  VALUE IS CONSISTENT WITH PREVIOUSLY REPORTED AND CALLED VALUE.    Culture (A)  Final    STAPHYLOCOCCUS AUREUS SUSCEPTIBILITIES PERFORMED ON PREVIOUS CULTURE WITHIN THE LAST 5 DAYS. Performed at [facility], [address] [place], [place] [address]    Report Status 02/17/2017 FINAL  Final  Culture, blood (Routine X 2) w Reflex to ID Panel     Status: Abnormal   Collection Time: 02/17/17 10:10 AM  Result Value Ref Range Status   Specimen Description BLOOD RIGHT HAND  Final   Special Requests IN PEDIATRIC BOTTLE Blood Culture adequate volume  Final   Culture  Setup Time   Final    GRAM POSITIVE COCCI IN PEDIATRIC BOTTLE CRITICAL VALUE NOTED.  VALUE IS CONSISTENT WITH PREVIOUSLY REPORTED AND CALLED VALUE.    Culture (A)  Final    STAPHYLOCOCCUS AUREUS SUSCEPTIBILITIES PERFORMED ON PREVIOUS CULTURE WITHIN THE LAST 5 DAYS. Performed at [facility], [address] [place], [place] [address]    Report Status 02/19/2017 FINAL  Final  Culture, blood (Routine X 2) w Reflex to ID Panel     Status: None (Preliminary result)   Collection Time: 02/17/17 10:15 AM  Result Value Ref Range Status   Specimen Description BLOOD LEFT ANTECUBITAL  Final   Special Requests IN PEDIATRIC BOTTLE Blood Culture adequate volume  Final   Culture   Final    NO GROWTH 2 DAYS Performed at [facility]  Lab, [address] [place], [place] [address]    Report Status PENDING  Incomplete      Studies: No results found.  Scheduled Meds: . buprenorphine-naloxone  2 tablet Sublingual Daily  . DULoxetine  60 mg Oral BID  . enoxaparin (LOVENOX) injection  40 mg Subcutaneous Q24H  . feeding supplement  1 Container Oral TID BM  . ferrous sulfate  325 mg Oral TID WC  . multivitamin with minerals  1 tablet Oral Daily  . OLANZapine  5 mg Oral q morning - 10a  . polyethylene glycol  17 g Oral Daily  . senna-docusate  1 tablet Oral BID    Continuous Infusions: .  ceFAZolin (ANCEF) IV Stopped (02/19/17 1444)     LOS: 6 days     Briant Cedar, MD [facility]  If 7PM-7AM, please contact night-coverage [email or web address] Password [facility] 02/19/2017, 6:35 PM

## 2017-02-19 NOTE — Progress Notes (Signed)
PROGRESS NOTE  [name] WUJ:[id number] DOB: 07/16/1985 DOA: 02/13/2017 PCP: Pearson GrippeKim, James, MD  HPI/Recap of past 4524 hours: 32 year old female who presented with a chief complaint weakness.  She does have a significant past medical history for fibromyalgia, chronic anemia and history intravenous drug abuse. Complaint of fever, chills, generalized weakness for the last 2 days prior to hospitalization.  She had erythema and petechiae on her left hand and bilateral lower extremities. Patient was admitted to the hospital with diagnosis of sepsis, due to left hand cellulitis, rule out endocarditis.  Today, pt denies any new complaints. Denies any chest pain, abdominal pain, SOB, fever/chills.  Assessment/Plan: Active Problems:   Sepsis affecting skin (HCC)   Pelvic pain   Cellulitis of left hand   Staphylococcus aureus bacteremia with sepsis (HCC)   Weakness of both lower extremities   Pain of right clavicle   IVDU (intravenous drug user)   Hepatitis C antibody positive in blood  Staphylococcus aureus bacteremia, complicated with sepsis Afebrile with leukocytosis BC positive with staph aureus, repeat pending Continue IV Cefazolin ID on board For TEE on 02/20/17  Acute on chronic anemia of iron deficiency Hgb 7.1, iron panel with iron 14, sats 9% Type and screen pending, if <7 will transfuse Continue iron supplements and prophylactic bowel regimen  Urinary tract infection Completed antibiotic therapy.   Hyperkalemia 6.3-->5.2 S/p kayexalate 30g Daily BMET  Thrombocytopenia Resolved  IV drug abuse No signs of withdrawal, will continue neuro checks per unit protocol As needed alprazolam, continue suboxone.   Depression Continue cymbalta and olanzapine   Code Status: Full  Family Communication: None at bedside  Disposition Plan: Home   Consultants:  ID  CIR  Procedures:  None  Antimicrobials:  IV Cefazolin   DVT prophylaxis:   Lovenox   Objective: Vitals:   02/18/17 2102 02/18/17 2102 02/19/17 0518 02/19/17 1434  BP: (!) 98/59 (!) 98/59 106/70 (!) 99/58  Pulse: (!) 101 (!) 101 95 91  Resp: 18 18 18 18   Temp: 98.6 F (37 C) 98.6 F (37 C) 98.3 F (36.8 C)   TempSrc: Oral     SpO2: 96% 96% 96% 92%  Weight:      Height:        Intake/Output Summary (Last 24 hours) at 02/19/2017 1835 Last data filed at 02/19/2017 1522 Gross per 24 hour  Intake 100 ml  Output -  Net 100 ml   Filed Weights   02/13/17 2120 02/17/17 1500  Weight: 76.5 kg (168 lb 10.4 oz) 80.7 kg (177 lb 14.6 oz)    Exam:   General:  Alert, awake, oriented, deconditioned  Cardiovascular: S1-S2 present, no added heart sounds  Respiratory: Chest clear bilaterally  Abdomen: Soft, nontender, nondistended, bowel sounds present  Musculoskeletal: No pedal edema bilaterally  Skin: Improved rash on the left hand  Psychiatry: Normal mood   Data Reviewed: CBC: Recent Labs  Lab 02/13/17 1604  02/15/17 0247 02/16/17 0252 02/17/17 0714 02/18/17 0503 02/19/17 0352  WBC 14.4*   < > 15.1* 14.3* 14.4* 14.5* 13.2*  NEUTROABS 12.4*  --   --   --   --   --  8.9*  HGB 11.5*   < > 7.9* 7.7* 7.4* 7.0* 7.1*  HCT 33.1*   < > 22.2* 21.6* 20.8* 20.3* 21.4*  MCV 84.4   < > 84.7 84.7 83.9 83.5 84.3  PLT 109*   < > 77* 92* 174 281 340   < > = values in this  interval not displayed.   Basic Metabolic Panel: Recent Labs  Lab 02/15/17 0247 02/16/17 0252 02/17/17 0714 02/18/17 0503 02/19/17 0352 02/19/17 0921  NA 133* 136 134* 135 135  --   K 3.1* 4.5 5.0 5.0 6.3* 5.2*  CL 98* 103 100* 100* 102  --   CO2 24 24 20* 22 18*  --   GLUCOSE 126* 92 121* 79 74  --   BUN 8 11 9 10 13   --   CREATININE 0.88 0.76 0.96 1.06* 1.00  --   CALCIUM 8.0* 7.9* 8.0* 8.2* 8.7*  --   MG 2.0  --   --   --   --   --    GFR: Estimated Creatinine Clearance: 88.2 mL/min (by C-G formula based on SCr of 1 mg/dL). Liver Function Tests: Recent Labs  Lab  02/13/17 1604 02/14/17 0305 02/15/17 0247 02/16/17 0252 02/18/17 0503  AST 52* 40 47* 49* 50*  ALT 39 29 27 22 22   ALKPHOS 177* 132* 153* 186* 173*  BILITOT 1.9* 2.2* 2.0* 2.1* 1.4*  PROT 7.0 5.4* 5.3* 5.1* 6.1*  ALBUMIN 2.5* 1.8* 1.6* 1.5* 1.6*   No results for input(s): LIPASE, AMYLASE in the last 168 hours. No results for input(s): AMMONIA in the last 168 hours. Coagulation Profile: Recent Labs  Lab 02/13/17 1604  INR 1.08   Cardiac Enzymes: Recent Labs  Lab 02/13/17 1701 02/13/17 2136 02/14/17 0305  TROPONINI <0.03 <0.03 <0.03   BNP (last 3 results) No results for input(s): PROBNP in the last 8760 hours. HbA1C: No results for input(s): HGBA1C in the last 72 hours. CBG: No results for input(s): GLUCAP in the last 168 hours. Lipid Profile: No results for input(s): CHOL, HDL, LDLCALC, TRIG, CHOLHDL, LDLDIRECT in the last 72 hours. Thyroid Function Tests: No results for input(s): TSH, T4TOTAL, FREET4, T3FREE, THYROIDAB in the last 72 hours. Anemia Panel: Recent Labs    02/17/17 0714  VITAMINB12 376  FOLATE 8.9  FERRITIN 508*  TIBC 158*  IRON 14*  RETICCTPCT 0.6   Urine analysis:    Component Value Date/Time   COLORURINE AMBER (A) 02/13/2017 0436   APPEARANCEUR HAZY (A) 02/13/2017 0436   LABSPEC 1.014 02/13/2017 0436   PHURINE 5.0 02/13/2017 0436   GLUCOSEU NEGATIVE 02/13/2017 0436   HGBUR LARGE (A) 02/13/2017 0436   BILIRUBINUR NEGATIVE 02/13/2017 0436   KETONESUR NEGATIVE 02/13/2017 0436   PROTEINUR NEGATIVE 02/13/2017 0436   UROBILINOGEN 0.2 12/07/2012 1510   NITRITE POSITIVE (A) 02/13/2017 0436   LEUKOCYTESUR TRACE (A) 02/13/2017 0436   Sepsis Labs: @LABRCNTIP (procalcitonin:4,lacticidven:4)  ) Recent Results (from the past 240 hour(s))  Culture, blood (Routine x 2)     Status: Abnormal   Collection Time: 02/13/17  4:00 PM  Result Value Ref Range Status   Specimen Description BLOOD RIGHT ANTECUBITAL  Final   Special Requests IN PEDIATRIC  BOTTLE Blood Culture adequate volume  Final   Culture  Setup Time   Final    GRAM POSITIVE COCCI IN PEDIATRIC BOTTLE CRITICAL RESULT CALLED TO, READ BACK BY AND VERIFIED WITHMelven Sartorius [facility] 1610 02/14/17 A BROWNING Performed at [facility], [address] [place], [place] [address]    Culture STAPHYLOCOCCUS AUREUS (A)  Final   Report Status 02/17/2017 FINAL  Final   Organism ID, Bacteria STAPHYLOCOCCUS AUREUS  Final      Susceptibility   Staphylococcus aureus - MIC*    CIPROFLOXACIN <=0.5 SENSITIVE Sensitive     ERYTHROMYCIN <=0.25 SENSITIVE Sensitive  GENTAMICIN <=0.5 SENSITIVE Sensitive     OXACILLIN <=0.25 SENSITIVE Sensitive     TETRACYCLINE >=16 RESISTANT Resistant     VANCOMYCIN 1 SENSITIVE Sensitive     TRIMETH/SULFA <=10 SENSITIVE Sensitive     CLINDAMYCIN <=0.25 SENSITIVE Sensitive     RIFAMPIN <=0.5 SENSITIVE Sensitive     Inducible Clindamycin NEGATIVE Sensitive     * STAPHYLOCOCCUS AUREUS  Blood Culture ID Panel (Reflexed)     Status: Abnormal   Collection Time: 02/13/17  4:00 PM  Result Value Ref Range Status   Enterococcus species NOT DETECTED NOT DETECTED Final   Listeria monocytogenes NOT DETECTED NOT DETECTED Final   Staphylococcus species DETECTED (A) NOT DETECTED Final    Comment: CRITICAL RESULT CALLED TO, READ BACK BY AND VERIFIED WITH: J [facility] PHARMD 1610 02/14/17 A BROWNING    Staphylococcus aureus DETECTED (A) NOT DETECTED Final    Comment: Methicillin (oxacillin) susceptible Staphylococcus aureus (MSSA). Preferred therapy is anti staphylococcal beta lactam antibiotic (Cefazolin or Nafcillin), unless clinically contraindicated. CRITICAL RESULT CALLED TO, READ BACK BY AND VERIFIED WITH: Melven Sartorius PHARMD [id number] 02/14/17 A BROWNING    Methicillin resistance NOT DETECTED NOT DETECTED Final   Streptococcus species NOT DETECTED NOT DETECTED Final   Streptococcus agalactiae NOT DETECTED NOT DETECTED Final   Streptococcus pneumoniae NOT DETECTED NOT  DETECTED Final   Streptococcus pyogenes NOT DETECTED NOT DETECTED Final   Acinetobacter baumannii NOT DETECTED NOT DETECTED Final   Enterobacteriaceae species NOT DETECTED NOT DETECTED Final   Enterobacter cloacae complex NOT DETECTED NOT DETECTED Final   Escherichia coli NOT DETECTED NOT DETECTED Final   Klebsiella oxytoca NOT DETECTED NOT DETECTED Final   Klebsiella pneumoniae NOT DETECTED NOT DETECTED Final   Proteus species NOT DETECTED NOT DETECTED Final   Serratia marcescens NOT DETECTED NOT DETECTED Final   Haemophilus influenzae NOT DETECTED NOT DETECTED Final   Neisseria meningitidis NOT DETECTED NOT DETECTED Final   Pseudomonas aeruginosa NOT DETECTED NOT DETECTED Final   Candida albicans NOT DETECTED NOT DETECTED Final   Candida glabrata NOT DETECTED NOT DETECTED Final   Candida krusei NOT DETECTED NOT DETECTED Final   Candida parapsilosis NOT DETECTED NOT DETECTED Final   Candida tropicalis NOT DETECTED NOT DETECTED Final    Comment: Performed at [facility] Lab, [address] [place], [place] [address]  Culture, blood (Routine x 2)     Status: Abnormal   Collection Time: 02/13/17  5:00 PM  Result Value Ref Range Status   Specimen Description BLOOD RIGHT ANTECUBITAL  Final   Special Requests   Final    BOTTLES DRAWN AEROBIC AND ANAEROBIC Blood Culture adequate volume   Culture  Setup Time   Final    GRAM POSITIVE COCCI IN CLUSTERS IN BOTH AEROBIC AND ANAEROBIC BOTTLES CRITICAL VALUE NOTED.  VALUE IS CONSISTENT WITH PREVIOUSLY REPORTED AND CALLED VALUE.    Culture (A)  Final    STAPHYLOCOCCUS AUREUS SUSCEPTIBILITIES PERFORMED ON PREVIOUS CULTURE WITHIN THE LAST 5 DAYS. Performed at [facility] Lab, [address] [place], [place] [address]    Report Status 02/16/2017 FINAL  Final  MRSA PCR Screening     Status: None   Collection Time: 02/13/17  9:32 PM  Result Value Ref Range Status   MRSA by PCR NEGATIVE NEGATIVE Final    Comment:        The GeneXpert  MRSA Assay (FDA approved for NASAL specimens only), is one component of a comprehensive MRSA colonization surveillance program. It  is not intended to diagnose MRSA infection nor to guide or monitor treatment for MRSA infections.   Culture, blood (Routine X 2) w Reflex to ID Panel     Status: Abnormal   Collection Time: 02/14/17 10:07 AM  Result Value Ref Range Status   Specimen Description BLOOD RIGHT HAND  Final   Special Requests IN PEDIATRIC BOTTLE Blood Culture adequate volume  Final   Culture  Setup Time   Final    GRAM POSITIVE COCCI IN PEDIATRIC BOTTLE CRITICAL VALUE NOTED.  VALUE IS CONSISTENT WITH PREVIOUSLY REPORTED AND CALLED VALUE.    Culture (A)  Final    STAPHYLOCOCCUS AUREUS SUSCEPTIBILITIES PERFORMED ON PREVIOUS CULTURE WITHIN THE LAST 5 DAYS. Performed at [facility] Lab, [address] [place], [place] [address]    Report Status 02/17/2017 FINAL  Final  Culture, blood (Routine X 2) w Reflex to ID Panel     Status: Abnormal   Collection Time: 02/17/17 10:10 AM  Result Value Ref Range Status   Specimen Description BLOOD RIGHT HAND  Final   Special Requests IN PEDIATRIC BOTTLE Blood Culture adequate volume  Final   Culture  Setup Time   Final    GRAM POSITIVE COCCI IN PEDIATRIC BOTTLE CRITICAL VALUE NOTED.  VALUE IS CONSISTENT WITH PREVIOUSLY REPORTED AND CALLED VALUE.    Culture (A)  Final    STAPHYLOCOCCUS AUREUS SUSCEPTIBILITIES PERFORMED ON PREVIOUS CULTURE WITHIN THE LAST 5 DAYS. Performed at [facility] Lab, [address] [place], [place] [address]    Report Status 02/19/2017 FINAL  Final  Culture, blood (Routine X 2) w Reflex to ID Panel     Status: None (Preliminary result)   Collection Time: 02/17/17 10:15 AM  Result Value Ref Range Status   Specimen Description BLOOD LEFT ANTECUBITAL  Final   Special Requests IN PEDIATRIC BOTTLE Blood Culture adequate volume  Final   Culture   Final    NO GROWTH 2 DAYS Performed at [facility]  Lab, [address] [place], [place] [address]    Report Status PENDING  Incomplete      Studies: No results found.  Scheduled Meds: . buprenorphine-naloxone  2 tablet Sublingual Daily  . DULoxetine  60 mg Oral BID  . enoxaparin (LOVENOX) injection  40 mg Subcutaneous Q24H  . feeding supplement  1 Container Oral TID BM  . ferrous sulfate  325 mg Oral TID WC  . multivitamin with minerals  1 tablet Oral Daily  . OLANZapine  5 mg Oral q morning - 10a  . polyethylene glycol  17 g Oral Daily  . senna-docusate  1 tablet Oral BID    Continuous Infusions: .  ceFAZolin (ANCEF) IV Stopped (02/19/17 1444)     LOS: 6 days     Briant Cedar, MD [facility]  If 7PM-7AM, please contact night-coverage [email or web address] Password [facility] 02/19/2017, 6:35 PM

## 2017-02-19 NOTE — Progress Notes (Signed)
CRITICAL VALUE ALERT  Critical Value:  Potassium 6.3  Date & Time Notied:  02/19/17 0528  Provider Notified: Craige CottaKirby, NP  Orders Received/Actions taken: No new orders

## 2017-02-19 NOTE — Progress Notes (Signed)
Pharmacy Antibiotic Note  [name] is a 32 y.o. female admitted on 02/13/2017 with bacteremia.  Pharmacy has been consulted for Ancef dosing.  Patient lost IV access and was on PO Bactrim and Zyvox. Now with a peripheral IV site placed this morning and to resume Ancef.  Repeat Bcx from 2/4 were still 1/2 positive for MSSA. Another repeat set was sent this morning.  Plan: Ancef 2g IV q8h Follow renal function, repeat BCx TEE schedule for tomorrow at 9AM  Height: 5\' 7"  (170.2 cm) Weight: 177 lb 14.6 oz (80.7 kg) IBW/kg (Calculated) : 61.6  Temp (24hrs), Avg:98.6 F (37 C), Min:98.3 F (36.8 C), Max:98.8 F (37.1 C)  Recent Labs  Lab 02/13/17 1630  02/13/17 2156 02/14/17 0027  02/15/17 0247 02/16/17 0252 02/17/17 0714 02/18/17 0503 02/19/17 0352  WBC  --    < >  --   --   --  15.1* 14.3* 14.4* 14.5* 13.2*  CREATININE  --    < >  --   --    < > 0.88 0.76 0.96 1.06* 1.00  LATICACIDVEN 2.29*  --  1.0 0.8  --   --   --   --   --   --    < > = values in this interval not displayed.    Estimated Creatinine Clearance: 88.2 mL/min (by C-G formula based on SCr of 1 mg/dL).    Allergies  Allergen Reactions  . Azithromycin Nausea And Vomiting    ABDOMINAL PAIN  . Nsaids     UNSPECIFIED REACTION    . Hydrocodone Nausea And Vomiting  . Penicillins Nausea And Vomiting     Has patient had a PCN reaction causing immediate rash, facial/tongue/throat swelling, SOB or lightheadedness with hypotension: No Has patient had a PCN reaction causing severe rash involving mucus membranes or skin necrosis: No Has patient had a PCN reaction that required hospitalization No Has patient had a PCN reaction occurring within the last 10 years: No If all of the above answers are "NO", then may proceed with Cephalosporin use.   . Prednisone Nausea Only and Other (See Comments)    Can take shot, but pill form "caused stomach pain , nausea"    Antimicrobials this admission: Vanc  1/31>>2/1 Zosyn 1/31>>2/1 Ancef 2/1>>2/3; 2/6>> Bactrim PO 2/3>>2/5 Zyvox 2/5>>2/6  Microbiology results: 1/31 BCx: 2/2 MSSA 2/1 BCx: 1/1 MSSA 2/4 BCx: 1/2 MSSA 2/6 BCx:   Thank you for allowing pharmacy to be a part of this patient's care.  Coltan Spinello D. Corderius Saraceni, PharmD, BCPS Clinical Pharmacist Clinical Phone for 02/19/2017 until 3:30pm: [phone] If after 3:30pm, please call main pharmacy at [phone] 02/19/2017 11:48 AM

## 2017-02-19 NOTE — Consult Note (Signed)
Physical Medicine and Rehabilitation Consult   Reason for Consult: Debility Referring Physician: Dr.    Sula RumpleHPI: [name] is a 32 y.o. female with history of fibromyalgia, chronic back pain, PTSD, IVDA who was admitted on 02/13/17 with one week history of weakness, LLE pain and inability to walk. She was found to be septic due to MSSA bacteremia and was started on broad spectrum antibiotics.  Dr. Daiva EvesVan Dam consulted for input and recommended full work up to determine source of infection. MRI cervical thoracic and lumbar spine done due to BLE weakness and was limited due to motion and was negative for infection, cord compression and showed minor stable L5/S1 disc disease.  2D echo done revealing EF 60-65% with no wall abnormality and moderate RAE.  Follow up blood cultures of 2/4 remained positive for staph and TEE pending. Patient with reports of clavicle pain at admission and ID question septic arthritis. Therapy ongoing and CIR recommended due to functional deficits.   Patient denies any upper or lower extremity pain currently.  She feels a little stiff in her left lower limb Review of Systems  HENT: Negative for hearing loss and tinnitus.   Eyes: Negative for blurred vision and double vision.  Respiratory: Negative for cough and shortness of breath.   Cardiovascular: Negative for chest pain and palpitations.  Gastrointestinal: Negative for heartburn and nausea.  Genitourinary: Negative for dysuria and urgency.  Musculoskeletal: Positive for back pain and myalgias (soreness BLE). Negative for joint pain (denies any pain).  Skin: Negative for rash.  Neurological: Positive for sensory change (BLE sensitive to touch), focal weakness and weakness.  Psychiatric/Behavioral: Negative for depression and memory loss.      Past Medical History:  Diagnosis Date  . Abdominal pain, unspecified site   . Anemia, unspecified   . Anxiety   . Anxiety state, unspecified   . Asthma   . Disc  herniation    causes sciatica unsure which disc  . Esophageal reflux   . Fatty liver   . Fibromyalgia   . Gastroparesis   . Headache(784.0)   . Heart murmur   . History of narcotic addiction (HCC) 09/30/2012   2010:  Per pt, was addicted to narcotics; mom helped intervene and stopped taking opiods   . Hyperemesis gravidarum with metabolic disturbance, unspecified as to episode of care   . Irritable bowel syndrome   . Irritable bowel syndrome   . Nausea alone   . Other and unspecified noninfectious gastroenteritis and colitis(558.9)   . Other dysphagia   . Pregnant   . PTSD (post-traumatic stress disorder)   . Unspecified asthma(493.90)   . Unspecified constipation     Past Surgical History:  Procedure Laterality Date  . APPENDECTOMY    . CHOLECYSTECTOMY    . COLONOSCOPY    . LAPAROSCOPIC BILATERAL SALPINGECTOMY Bilateral 12/08/2012   Procedure: LAPAROSCOPIC BILATERAL SALPINGECTOMY;  Surgeon: Tilda BurrowJohn V Ferguson, MD;  Location: [facility];  Service: Gynecology;  Laterality: Bilateral;  . MULTIPLE EXTRACTIONS WITH ALVEOLOPLASTY N/A 12/01/2015   Procedure: EXTRACTION TEETH TWO, THREE, FOUR, SIX, SEVEN, EIGHT, NINE, TEN, ELEVEN, TWELVE, FOURTEEN, FIFTEEN, TWENTY, TWENTY ONE, TWENTY EIGHT, TWENTY NINE, THIRTY AND THIRTY ONE WITH ALVEOLOPLASTY;  Surgeon: Ocie DoyneScott Jensen, DDS;  Location: [facility];  Service: Oral Surgery;  Laterality: N/A;  . TUBAL LIGATION      Family History  Problem Relation Age of Onset  . Ulcerative colitis Paternal Grandmother   . Cancer Paternal Grandmother   .  Colon polyps Paternal Grandfather   . Cancer Paternal Grandfather        thyroid  . Diabetes Father   . Heart disease Father   . Kidney disease Father   . Hypertension Father     Social History:  reports that she has been smoking cigarettes.  She has a 20.00 pack-year smoking history. she has never used smokeless tobacco. She reports that she does not drink alcohol or use drugs.    Allergies  Allergen  Reactions  . Azithromycin Nausea And Vomiting    ABDOMINAL PAIN  . Nsaids     UNSPECIFIED REACTION    . Hydrocodone Nausea And Vomiting  . Penicillins Nausea And Vomiting     Has patient had a PCN reaction causing immediate rash, facial/tongue/throat swelling, SOB or lightheadedness with hypotension: No Has patient had a PCN reaction causing severe rash involving mucus membranes or skin necrosis: No Has patient had a PCN reaction that required hospitalization No Has patient had a PCN reaction occurring within the last 10 years: No If all of the above answers are "NO", then may proceed with Cephalosporin use.   . Prednisone Nausea Only and Other (See Comments)    Can take shot, but pill form "caused stomach pain , nausea"   Medications Prior to Admission  Medication Sig Dispense Refill  . acetaminophen (TYLENOL) 500 MG tablet Take 1,000 mg by mouth every 6 (six) hours as needed for mild pain or moderate pain.    Marland Kitchen ALPRAZolam (XANAX) 0.5 MG tablet Take 1 tablet (0.5 mg total) by mouth 3 (three) times daily as needed for anxiety. (Patient taking differently: Take 0.5 mg by mouth 4 (four) times daily. ) 21 tablet 0  . Buprenorphine HCl-Naloxone HCl (SUBOXONE) 4-1 MG FILM Place 2 Film under the tongue daily.     . DULoxetine (CYMBALTA) 60 MG capsule Take 60 mg by mouth 2 (two) times daily.    . Multiple Vitamin (MULTIVITAMIN WITH MINERALS) TABS tablet Take 1 tablet by mouth daily. ALL NATURAL VITAMIN: Made by Romie Jumper (system six)    . OLANZapine (ZYPREXA) 5 MG tablet Take 1 tablet by mouth every morning.    . ondansetron (ZOFRAN) 4 MG tablet Take 1 tablet (4 mg total) by mouth every 6 (six) hours. For nausea or vomiting 12 tablet 0  . cyclobenzaprine (FLEXERIL) 10 MG tablet Take 1 tablet (10 mg total) by mouth 3 (three) times daily. 12 tablet 0    Home: Home Living Family/patient expects to be discharged to:: Private residence Living Arrangements: Parent Available Help at Discharge:  Family, Available 24 hours/day Type of Home: House Home Access: Stairs to enter [employer] of Steps: 2 Entrance Stairs-Rails: None Home Layout: Two level, Able to live on main level with bedroom/bathroom Bathroom Shower/Tub: Tub/shower unit, Engineer, building services: Standard Bathroom Accessibility: Yes Home Equipment: Hand held shower head  Functional History: Prior Function Level of Independence: Independent Comments: Has 3 kids  - 13,11,4 Functional Status:  Mobility: Bed Mobility Overal bed mobility: Needs Assistance Bed Mobility: Supine to Sit Supine to sit: HOB elevated, Supervision General bed mobility comments: supervision requires increased time and effort Transfers Overall transfer level: Needs assistance Equipment used: Rolling walker (2 wheeled) Transfers: Sit to/from Stand, [employer] Transfers Sit to Stand: Min assist Stand pivot transfers: Min assist General transfer comment: increased time to complete, assist to power up, steadying with RW to transfer to [facility] Ambulation/Gait Ambulation/Gait assistance: Min guard Ambulation Distance (Feet): 5 Feet Assistive device: Rolling  walker (2 wheeled) Gait Pattern/deviations: Step-through pattern, Decreased stride length General Gait Details: slow, antalgic steps to head of bed from [facility] Gait velocity: decreased Gait velocity interpretation: Below normal speed for age/gender    ADL: ADL Overall ADL's : Needs assistance/impaired Eating/Feeding: Independent Grooming: Sitting, Set up, Supervision/safety Upper Body Bathing: Set up, Sitting Lower Body Bathing: Minimal assistance, Sit to/from stand Upper Body Dressing : Minimal assistance, Sitting Lower Body Dressing: Moderate assistance, Sit to/from stand Toilet Transfer: Minimal assistance, RW, Ambulation, BSC Toileting- Clothing Manipulation and Hygiene: Set up, Supervision/safety, Sit to/from stand, Sitting/lateral lean Functional mobility during ADLs:  Minimal assistance, Rolling walker General ADL Comments: HR increases to 125 with minimal activity  Cognition: Cognition Overall Cognitive Status: Within Functional Limits for tasks assessed Orientation Level: Oriented X4 Cognition Arousal/Alertness: Awake/alert Behavior During Therapy: WFL for tasks assessed/performed Overall Cognitive Status: Within Functional Limits for tasks assessed   Blood pressure 106/70, pulse 95, temperature 98.3 F (36.8 C), resp. rate 18, height 5\' 7"  (1.702 m), weight 80.7 kg (177 lb 14.6 oz), last menstrual period 01/28/2017, SpO2 96 %. Physical Exam  Nursing note and vitals reviewed. Constitutional: She is oriented to person, place, and time. She appears well-developed and well-nourished. No distress.  HENT:  Head: Normocephalic and atraumatic.  Eyes: Conjunctivae and EOM are normal. Pupils are equal, round, and reactive to light.  Neck: Normal range of motion. Neck supple. No JVD present. No thyromegaly present.  Cardiovascular: Normal rate, regular rhythm and normal heart sounds.  No murmur heard. Respiratory: Effort normal and breath sounds normal. No respiratory distress. She has no wheezes.  GI: Soft. Bowel sounds are normal. She exhibits no distension. There is no tenderness.  Neurological: She is alert and oriented to person, place, and time. No sensory deficit. Coordination normal.  Reflex Scores:      Patellar reflexes are 2+ on the right side and 2+ on the left side. Motor strength is 5/5 bilateral deltoid, bicep, tricep, grip, right hip flexion knee extension ankle dorsiflexor 4/5 in the left hip flexion knee extension 5 in left ankle dorsiflexion.  Patient states that she is from laying wrong and does not feel like her left leg is weak Finger-nose-finger or heel to shin are normal  Skin: She is not diaphoretic.  Psychiatric: She has a normal mood and affect. Her behavior is normal. Judgment and thought content normal.    Results for  orders placed or performed during the hospital encounter of 02/13/17 (from the past 24 hour(s))  CBC with Differential/Platelet     Status: Abnormal   Collection Time: 02/19/17  3:52 AM  Result Value Ref Range   WBC 13.2 (H) 4.0 - 10.5 K/uL   RBC 2.54 (L) 3.87 - 5.11 MIL/uL   Hemoglobin 7.1 (L) 12.0 - 15.0 g/dL   HCT 16.1 (L) 09.6 - 04.5 %   MCV 84.3 78.0 - 100.0 fL   MCH 28.0 26.0 - 34.0 pg   MCHC 33.2 30.0 - 36.0 g/dL   RDW 40.9 81.1 - 91.4 %   Platelets 340 150 - 400 K/uL   Neutrophils Relative % 67 %   Lymphocytes Relative 25 %   Monocytes Relative 6 %   Eosinophils Relative 1 %   Basophils Relative 1 %   Neutro Abs 8.9 (H) 1.7 - 7.7 K/uL   Lymphs Abs 3.3 0.7 - 4.0 K/uL   Monocytes Absolute 0.8 0.1 - 1.0 K/uL   Eosinophils Absolute 0.1 0.0 - 0.7 K/uL   Basophils Absolute  0.1 0.0 - 0.1 K/uL   RBC Morphology POLYCHROMASIA PRESENT    WBC Morphology TOXIC GRANULATION   Basic metabolic panel     Status: Abnormal   Collection Time: 02/19/17  3:52 AM  Result Value Ref Range   Sodium 135 135 - 145 mmol/L   Potassium 6.3 (HH) 3.5 - 5.1 mmol/L   Chloride 102 101 - 111 mmol/L   CO2 18 (L) 22 - 32 mmol/L   Glucose, Bld 74 65 - 99 mg/dL   BUN 13 6 - 20 mg/dL   Creatinine, Ser 1.61 0.44 - 1.00 mg/dL   Calcium 8.7 (L) 8.9 - 10.3 mg/dL   GFR calc non Af Amer >60 >60 mL/min   GFR calc Af Amer >60 >60 mL/min   Anion gap 15 5 - 15   No results found.  Assessment/Plan: Diagnosis: Deconditioning secondary to sepsis methicillin sensitive staph aureus 1. Does the need for close, 24 hr/day medical supervision in concert with the patient's rehab needs make it unreasonable for this patient to be served in a less intensive setting? Yes 2. Co-Morbidities requiring supervision/potential complications:  low back pain, fibromyalgia, PTSD, IV drug abuse 3. Due to medication administration, does the patient require 24 hr/day rehab nursing? No 4. Does the patient require coordinated care of a  physician, rehab nurse, PT OT to address physical and functional deficits in the context of the above medical diagnosis(es)? No Addressing deficits in the following areas: balance, locomotion, strength, transferring and toileting 5. Can the patient actively participate in an intensive therapy program of at least 3 hrs of therapy per day at least 5 days per week? Yes 6. The potential for patient to make measurable gains while on inpatient rehab is Not applicable 7. Anticipated functional outcomes upon discharge from inpatient rehab are n/a  with PT, n/a with OT, n/a with SLP. 8. Estimated rehab length of stay to reach the above functional goals is: Not applicable 9. Anticipated D/C setting: Home 10. Anticipated post D/C treatments: HH therapy 11. Overall Rehab/Functional Prognosis: good  RECOMMENDATIONS: This patient's condition is appropriate for continued rehabilitative care in the following setting: [facility] Patient has agreed to participate in recommended program. N/A Note that insurance prior authorization may be required for reimbursement for recommended care.  Comment: Patient was min assist first time up, no obvious neurologic deficits, would anticipate quick improvement to premorbid functional status  Erick Colace M.D. [facility] FAAPM&R (Sports Med, Neuromuscular Med) Diplomate Am Board of Electrodiagnostic Med  Jacquelynn Cree, PA-C 02/19/2017

## 2017-02-19 NOTE — Progress Notes (Signed)
Subjective:  No new complaints   Antibiotics:  Anti-infectives (From admission, onward)   Start     Dose/Rate Route Frequency Ordered Stop   02/18/17 1330  linezolid (ZYVOX) tablet 600 mg  Status:  Discontinued     600 mg Oral Every 12 hours 02/18/17 1250 02/19/17 1126   02/17/17 0800  sulfamethoxazole-trimethoprim (BACTRIM DS,SEPTRA DS) 800-160 MG per tablet 2 tablet  Status:  Discontinued     2 tablet Oral Every 12 hours 02/16/17 1453 02/18/17 1250   02/16/17 1600  sulfamethoxazole-trimethoprim (BACTRIM DS,SEPTRA DS) 800-160 MG per tablet 2 tablet     2 tablet Oral  Once 02/16/17 1509 02/16/17 1657   02/14/17 1400  ceFAZolin (ANCEF) IVPB 2g/100 mL premix  Status:  Discontinued     2 g 200 mL/hr over 30 Minutes Intravenous Every 8 hours 02/14/17 1013 02/16/17 1453   02/14/17 0200  vancomycin (VANCOCIN) IVPB 750 mg/150 ml premix  Status:  Discontinued     750 mg 150 mL/hr over 60 Minutes Intravenous Every 8 hours 02/13/17 1739 02/14/17 1017   02/14/17 0000  piperacillin-tazobactam (ZOSYN) IVPB 3.375 g  Status:  Discontinued     3.375 g 12.5 mL/hr over 240 Minutes Intravenous Every 8 hours 02/13/17 1739 02/14/17 1013   02/13/17 1715  piperacillin-tazobactam (ZOSYN) IVPB 3.375 g     3.375 g 100 mL/hr over 30 Minutes Intravenous  Once 02/13/17 1701 02/13/17 1824   02/13/17 1715  vancomycin (VANCOCIN) IVPB 1000 mg/200 mL premix  Status:  Discontinued     1,000 mg 200 mL/hr over 60 Minutes Intravenous  Once 02/13/17 1701 02/13/17 1705   02/13/17 1715  vancomycin (VANCOCIN) 1,500 mg in sodium chloride 0.9 % 500 mL IVPB     1,500 mg 250 mL/hr over 120 Minutes Intravenous  Once 02/13/17 1705 02/13/17 2300      Medications: Scheduled Meds: . buprenorphine-naloxone  2 tablet Sublingual Daily  . DULoxetine  60 mg Oral BID  . enoxaparin (LOVENOX) injection  40 mg Subcutaneous Q24H  . feeding supplement  1 Container Oral TID BM  . ferrous sulfate  325 mg Oral TID WC  .  multivitamin with minerals  1 tablet Oral Daily  . OLANZapine  5 mg Oral q morning - 10a  . polyethylene glycol  17 g Oral Daily  . senna-docusate  1 tablet Oral BID   Continuous Infusions:  PRN Meds:.acetaminophen, ALPRAZolam, cyclobenzaprine, ondansetron **OR** ondansetron (ZOFRAN) IV    Objective: Weight change:   Intake/Output Summary (Last 24 hours) at 02/19/2017 1145 Last data filed at 02/19/2017 0839 Gross per 24 hour  Intake 0 ml  Output -  Net 0 ml   Blood pressure 106/70, pulse 95, temperature 98.3 F (36.8 C), resp. rate 18, height 5\' 7"  (1.702 m), weight 177 lb 14.6 oz (80.7 kg), last menstrual period 01/28/2017, SpO2 96 %. Temp:  [98.3 F (36.8 C)-98.8 F (37.1 C)] 98.3 F (36.8 C) (02/06 0518) Pulse Rate:  [95-103] 95 (02/06 0518) Resp:  [18] 18 (02/06 0518) BP: (96-106)/(50-70) 106/70 (02/06 0518) SpO2:  [96 %-98 %] 96 % (02/06 0518)  Physical Exam: General: Alert and awake, oriented x3,and dysphoric HEENT: anicteric sclera, pupils reactive to light and accommodation, EOMI CVS regular rate, normal r,  no murmur rubs or gallops Chest: clear to auscultation bilaterally, no wheezing, rales or rhonchi Abdomen: soft nontender, nondistended, normal bowel sounds, Extremities: she has minimal tenderness of clavicle Skin: rashes resolving on feet  Neuro:  nonfocal  CBC:  CBC Latest Ref Rng & Units 02/19/2017 02/18/2017 02/17/2017  WBC 4.0 - 10.5 K/uL 13.2(H) 14.5(H) 14.4(H)  Hemoglobin 12.0 - 15.0 g/dL 7.1(L) 7.0(L) 7.4(L)  Hematocrit 36.0 - 46.0 % 21.4(L) 20.3(L) 20.8(L)  Platelets 150 - 400 K/uL 340 281 174      BMET Recent Labs    02/18/17 0503 02/19/17 0352 02/19/17 0921  NA 135 135  --   K 5.0 6.3* 5.2*  CL 100* 102  --   CO2 22 18*  --   GLUCOSE 79 74  --   BUN 10 13  --   CREATININE 1.06* 1.00  --   CALCIUM 8.2* 8.7*  --      Liver Panel  Recent Labs    02/18/17 0503  PROT 6.1*  ALBUMIN 1.6*  AST 50*  ALT 22  ALKPHOS 173*  BILITOT  1.4*       Sedimentation Rate No results for input(s): ESRSEDRATE in the last 72 hours. C-Reactive Protein No results for input(s): CRP in the last 72 hours.  Micro Results: Recent Results (from the past 720 hour(s))  Culture, blood (Routine x 2)     Status: Abnormal   Collection Time: 02/13/17  4:00 PM  Result Value Ref Range Status   Specimen Description BLOOD RIGHT ANTECUBITAL  Final   Special Requests IN PEDIATRIC BOTTLE Blood Culture adequate volume  Final   Culture  Setup Time   Final    GRAM POSITIVE COCCI IN PEDIATRIC BOTTLE CRITICAL RESULT CALLED TO, READ BACK BY AND VERIFIED WITHMelven [facility] 1610 02/14/17 A BROWNING Performed at [facility], [address] [place], [place] [address]    Culture STAPHYLOCOCCUS AUREUS (A)  Final   Report Status 02/17/2017 FINAL  Final   Organism ID, Bacteria STAPHYLOCOCCUS AUREUS  Final      Susceptibility   Staphylococcus aureus - MIC*    CIPROFLOXACIN <=0.5 SENSITIVE Sensitive     ERYTHROMYCIN <=0.25 SENSITIVE Sensitive     GENTAMICIN <=0.5 SENSITIVE Sensitive     OXACILLIN <=0.25 SENSITIVE Sensitive     TETRACYCLINE >=16 RESISTANT Resistant     VANCOMYCIN 1 SENSITIVE Sensitive     TRIMETH/SULFA <=10 SENSITIVE Sensitive     CLINDAMYCIN <=0.25 SENSITIVE Sensitive     RIFAMPIN <=0.5 SENSITIVE Sensitive     Inducible Clindamycin NEGATIVE Sensitive     * STAPHYLOCOCCUS AUREUS  Blood Culture ID Panel (Reflexed)     Status: Abnormal   Collection Time: 02/13/17  4:00 PM  Result Value Ref Range Status   Enterococcus species NOT DETECTED NOT DETECTED Final   Listeria monocytogenes NOT DETECTED NOT DETECTED Final   Staphylococcus species DETECTED (A) NOT DETECTED Final    Comment: CRITICAL RESULT CALLED TO, READ BACK BY AND VERIFIED WITH: J [facility] PHARMD [id number] 02/14/17 A BROWNING    Staphylococcus aureus DETECTED (A) NOT DETECTED Final    Comment: Methicillin (oxacillin) susceptible Staphylococcus aureus (MSSA). Preferred  therapy is anti staphylococcal beta lactam antibiotic (Cefazolin or Nafcillin), unless clinically contraindicated. CRITICAL RESULT CALLED TO, READ BACK BY AND VERIFIED WITH: Melven Sartorius PHARMD 1191 02/14/17 A BROWNING    Methicillin resistance NOT DETECTED NOT DETECTED Final   Streptococcus species NOT DETECTED NOT DETECTED Final   Streptococcus agalactiae NOT DETECTED NOT DETECTED Final   Streptococcus pneumoniae NOT DETECTED NOT DETECTED Final   Streptococcus pyogenes NOT DETECTED NOT DETECTED Final   Acinetobacter baumannii NOT DETECTED NOT DETECTED Final   Enterobacteriaceae species NOT DETECTED  NOT DETECTED Final   Enterobacter cloacae complex NOT DETECTED NOT DETECTED Final   Escherichia coli NOT DETECTED NOT DETECTED Final   Klebsiella oxytoca NOT DETECTED NOT DETECTED Final   Klebsiella pneumoniae NOT DETECTED NOT DETECTED Final   Proteus species NOT DETECTED NOT DETECTED Final   Serratia marcescens NOT DETECTED NOT DETECTED Final   Haemophilus influenzae NOT DETECTED NOT DETECTED Final   Neisseria meningitidis NOT DETECTED NOT DETECTED Final   Pseudomonas aeruginosa NOT DETECTED NOT DETECTED Final   Candida albicans NOT DETECTED NOT DETECTED Final   Candida glabrata NOT DETECTED NOT DETECTED Final   Candida krusei NOT DETECTED NOT DETECTED Final   Candida parapsilosis NOT DETECTED NOT DETECTED Final   Candida tropicalis NOT DETECTED NOT DETECTED Final    Comment: Performed at [facility], [address], [place], [place] [address]  Culture, blood (Routine x 2)     Status: Abnormal   Collection Time: 02/13/17  5:00 PM  Result Value Ref Range Status   Specimen Description BLOOD RIGHT ANTECUBITAL  Final   Special Requests   Final    BOTTLES DRAWN AEROBIC AND ANAEROBIC Blood Culture adequate volume   Culture  Setup Time   Final    GRAM POSITIVE COCCI IN CLUSTERS IN BOTH AEROBIC AND ANAEROBIC BOTTLES CRITICAL VALUE NOTED.  VALUE IS CONSISTENT WITH PREVIOUSLY REPORTED AND  CALLED VALUE.    Culture (A)  Final    STAPHYLOCOCCUS AUREUS SUSCEPTIBILITIES PERFORMED ON PREVIOUS CULTURE WITHIN THE LAST 5 DAYS. Performed at [facility], [address], [place], [place] [address]    Report Status 02/16/2017 FINAL  Final  MRSA PCR Screening     Status: None   Collection Time: 02/13/17  9:32 PM  Result Value Ref Range Status   MRSA by PCR NEGATIVE NEGATIVE Final    Comment:        The GeneXpert MRSA Assay (FDA approved for NASAL specimens only), is one component of a comprehensive MRSA colonization surveillance program. It is not intended to diagnose MRSA infection nor to guide or monitor treatment for MRSA infections.   Culture, blood (Routine X 2) w Reflex to ID Panel     Status: Abnormal   Collection Time: 02/14/17 10:07 AM  Result Value Ref Range Status   Specimen Description BLOOD RIGHT HAND  Final   Special Requests IN PEDIATRIC BOTTLE Blood Culture adequate volume  Final   Culture  Setup Time   Final    GRAM POSITIVE COCCI IN PEDIATRIC BOTTLE CRITICAL VALUE NOTED.  VALUE IS CONSISTENT WITH PREVIOUSLY REPORTED AND CALLED VALUE.    Culture (A)  Final    STAPHYLOCOCCUS AUREUS SUSCEPTIBILITIES PERFORMED ON PREVIOUS CULTURE WITHIN THE LAST 5 DAYS. Performed at [facility], [address] [place], [place] [address]    Report Status 02/17/2017 FINAL  Final  Culture, blood (Routine X 2) w Reflex to ID Panel     Status: Abnormal   Collection Time: 02/17/17 10:10 AM  Result Value Ref Range Status   Specimen Description BLOOD RIGHT HAND  Final   Special Requests IN PEDIATRIC BOTTLE Blood Culture adequate volume  Final   Culture  Setup Time   Final    GRAM POSITIVE COCCI IN PEDIATRIC BOTTLE CRITICAL VALUE NOTED.  VALUE IS CONSISTENT WITH PREVIOUSLY REPORTED AND CALLED VALUE.    Culture (A)  Final    STAPHYLOCOCCUS AUREUS SUSCEPTIBILITIES PERFORMED ON PREVIOUS CULTURE WITHIN THE LAST 5 DAYS. Performed at [facility]  [facility], [address], [place], [place] [address]    Report Status 02/19/2017 FINAL  Final  Culture, blood (Routine X 2) w Reflex to ID Panel     Status: None (Preliminary result)   Collection Time: 02/17/17 10:15 AM  Result Value Ref Range Status   Specimen Description BLOOD LEFT ANTECUBITAL  Final   Special Requests IN PEDIATRIC BOTTLE Blood Culture adequate volume  Final   Culture   Final    NO GROWTH 1 DAY Performed at [facility], [address] [place], [place] [address]    Report Status PENDING  Incomplete    Studies/Results: No results found.    Assessment/Plan:  INTERVAL HISTORY:  Still with persistently positive blood cultures  DID REGAIN IV access today  Active Problems:   Sepsis affecting skin (HCC)   Pelvic pain   Cellulitis of left hand   Staphylococcus aureus bacteremia with sepsis (HCC)   Weakness of both lower extremities   Pain of right clavicle   IVDU (intravenous drug user)   Hepatitis C antibody positive in blood    [name] is a 32 y.o. female with  IVDU, MSSA bacteremia and sepsis with shoulder (clavicle pain), tender area on her left hand and LE weakness  #1      [facility] Staphylococcus aureus bacteremia   Staphylococcus aureus bacteremia (SAB) is associated with a high rate of complications and mortality.  Specific aspects of clinical management are critical to optimizing the outcome of patients with SAB.  Therefore, the [facility] Antimicrobial Management Team [facility]) has initiated an intervention aimed at improving the management of SAB at [facility].  To do so, Infectious Diseases physicians are providing an evidence-based consult for the management of all patients with SAB.     Yes No Comments  Perform follow-up blood cultures (even if the patient is afebrile) to ensure clearance of bacteremia [x]  []  Still positive, repeating now that some one was able to get a blood culture  Remove vascular catheter and obtain  follow-up blood cultures after the removal of the catheter []  []   room cefazolin   Perform echocardiography to evaluate for endocarditis (transthoracic ECHO is 40-50% sensitive, TEE is > 90% sensitive) []  []  Please keep in mind, that neither test can definitively EXCLUDE endocarditis, and that should clinical suspicion remain high for endocarditis the patient should then still be treated with an "endocarditis" duration of therapy = 6 weeks  SHE NEEDS TEE now has IV access  Consult electrophysiologist to evaluate implanted cardiac device (pacemaker, ICD) []  []    Ensure source control []  []  Have all abscesses been drained effectively? Have deep seeded infections (septic joints or osteomyelitis) had appropriate surgical debridement?  Investigate for "metastatic" sites of infection []  []  Does the patient have ANY symptom or physical exam finding that would suggest a deeper infection (back or neck pain that may be suggestive of vertebral osteomyelitis or epidural abscess, muscle pain that could be a symptom of pyomyositis)?  Keep in mind that for deep seeded infections MRI imaging with contrast is preferred rather than other often insensitive tests such as plain x-rays, especially early in a patient's presentation.  I will order CT with contrast of the right clavicle  Change antibiotic therapy to cefazolin 2 g IV every 8 hours times 8 weeks []  []  Beta-lactam antibiotics are preferred for MSSA due to higher cure rates.   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

## 2017-02-20 ENCOUNTER — Inpatient Hospital Stay (HOSPITAL_COMMUNITY): Payer: Medicaid Other

## 2017-02-20 ENCOUNTER — Inpatient Hospital Stay (HOSPITAL_COMMUNITY): Payer: Medicaid Other | Admitting: Anesthesiology

## 2017-02-20 ENCOUNTER — Encounter (HOSPITAL_COMMUNITY): Payer: Self-pay | Admitting: Radiology

## 2017-02-20 ENCOUNTER — Encounter (HOSPITAL_COMMUNITY)
Admission: EM | Disposition: A | Discharge status: Home / Self Care | Payer: Self-pay | Source: Home / Self Care | Attending: Internal Medicine

## 2017-02-20 DIAGNOSIS — M00011 Staphylococcal arthritis, right shoulder: Secondary | ICD-10-CM

## 2017-02-20 DIAGNOSIS — R7881 Bacteremia: Secondary | ICD-10-CM

## 2017-02-20 DIAGNOSIS — I361 Nonrheumatic tricuspid (valve) insufficiency: Secondary | ICD-10-CM

## 2017-02-20 DIAGNOSIS — B9561 Methicillin susceptible Staphylococcus aureus infection as the cause of diseases classified elsewhere: Secondary | ICD-10-CM

## 2017-02-20 DIAGNOSIS — Q211 Atrial septal defect: Secondary | ICD-10-CM

## 2017-02-20 DIAGNOSIS — M009 Pyogenic arthritis, unspecified: Secondary | ICD-10-CM

## 2017-02-20 HISTORY — DX: Pyogenic arthritis, unspecified: M00.9

## 2017-02-20 HISTORY — DX: Bacteremia: R78.81

## 2017-02-20 HISTORY — DX: Methicillin susceptible Staphylococcus aureus infection as the cause of diseases classified elsewhere: B95.61

## 2017-02-20 HISTORY — PX: TEE WITHOUT CARDIOVERSION: SHX5443

## 2017-02-20 LAB — CBC WITH DIFFERENTIAL/PLATELET
Basophils Absolute: 0 10*3/uL (ref 0.0–0.1)
Basophils Relative: 0 %
EOS PCT: 1 %
Eosinophils Absolute: 0.1 10*3/uL (ref 0.0–0.7)
HEMATOCRIT: 19.7 % — AB (ref 36.0–46.0)
HEMOGLOBIN: 6.7 g/dL — AB (ref 12.0–15.0)
LYMPHS PCT: 26 %
Lymphs Abs: 3.1 10*3/uL (ref 0.7–4.0)
MCH: 29.1 pg (ref 26.0–34.0)
MCHC: 34 g/dL (ref 30.0–36.0)
MCV: 85.7 fL (ref 78.0–100.0)
MONOS PCT: 6 %
Monocytes Absolute: 0.7 10*3/uL (ref 0.1–1.0)
NEUTROS PCT: 67 %
Neutro Abs: 7.9 10*3/uL — ABNORMAL HIGH (ref 1.7–7.7)
Platelets: 417 10*3/uL — ABNORMAL HIGH (ref 150–400)
RBC: 2.3 MIL/uL — AB (ref 3.87–5.11)
RDW: 14.9 % (ref 11.5–15.5)
WBC: 11.8 10*3/uL — ABNORMAL HIGH (ref 4.0–10.5)

## 2017-02-20 LAB — POCT I-STAT 4, (NA,K, GLUC, HGB,HCT)
GLUCOSE: 83 mg/dL (ref 65–99)
HCT: 24 % — ABNORMAL LOW (ref 36.0–46.0)
Hemoglobin: 8.2 g/dL — ABNORMAL LOW (ref 12.0–15.0)
Potassium: 4.5 mmol/L (ref 3.5–5.1)
Sodium: 136 mmol/L (ref 135–145)

## 2017-02-20 LAB — BASIC METABOLIC PANEL
ANION GAP: 13 (ref 5–15)
BUN: 13 mg/dL (ref 6–20)
CALCIUM: 8.5 mg/dL — AB (ref 8.9–10.3)
CHLORIDE: 99 mmol/L — AB (ref 101–111)
CO2: 21 mmol/L — ABNORMAL LOW (ref 22–32)
Creatinine, Ser: 1.03 mg/dL — ABNORMAL HIGH (ref 0.44–1.00)
GFR calc non Af Amer: >60 mL/min (ref >60)
Glucose, Bld: 87 mg/dL (ref 65–99)
Potassium: 4.4 mmol/L (ref 3.5–5.1)
SODIUM: 133 mmol/L — AB (ref 135–145)

## 2017-02-20 LAB — ABO/RH: ABO/RH(D): O POS

## 2017-02-20 LAB — PREPARE RBC (CROSSMATCH)

## 2017-02-20 SURGERY — ECHOCARDIOGRAM, TRANSESOPHAGEAL
Anesthesia: General

## 2017-02-20 MED ORDER — PROPOFOL 500 MG/50ML IV EMUL
INTRAVENOUS | Status: DC | PRN
  Administered 2017-02-20: 100 ug/kg/min via INTRAVENOUS

## 2017-02-20 MED ORDER — SODIUM CHLORIDE 0.9 % IV SOLN
Freq: Once | INTRAVENOUS | Status: AC
  Administered 2017-02-20: 09:00:00 via INTRAVENOUS

## 2017-02-20 MED ORDER — ONDANSETRON HCL 4 MG/2ML IJ SOLN
INTRAMUSCULAR | Status: DC | PRN
  Administered 2017-02-20: 4 mg via INTRAVENOUS

## 2017-02-20 MED ORDER — PROPOFOL 10 MG/ML IV BOLUS
INTRAVENOUS | Status: DC | PRN
  Administered 2017-02-20 (×4): 20 mg via INTRAVENOUS
  Administered 2017-02-20: 15 mg via INTRAVENOUS

## 2017-02-20 MED ORDER — LACTATED RINGERS IV SOLN
INTRAVENOUS | Status: DC | PRN
  Administered 2017-02-20: 14:00:00 via INTRAVENOUS

## 2017-02-20 MED ORDER — BUTAMBEN-TETRACAINE-BENZOCAINE 2-2-14 % EX AERO
INHALATION_SPRAY | CUTANEOUS | Status: DC | PRN
  Administered 2017-02-20: 2 via TOPICAL

## 2017-02-20 MED ORDER — MIDAZOLAM HCL 5 MG/5ML IJ SOLN
INTRAMUSCULAR | Status: DC | PRN
  Administered 2017-02-20: 2 mg via INTRAVENOUS

## 2017-02-20 MED ORDER — ENSURE ENLIVE PO LIQD
237.0000 mL | Freq: Two times a day (BID) | ORAL | Status: DC
  Administered 2017-02-22 – 2017-03-04 (×7): 237 mL via ORAL
  Filled 2017-02-20 (×2): qty 237

## 2017-02-20 MED ORDER — SODIUM CHLORIDE 0.9 % IV SOLN: INTRAVENOUS | Status: DC

## 2017-02-20 NOTE — Progress Notes (Signed)
PT Cancellation Note  Patient Details Name: Tammy ArabCasey M Tubby MRN: [id number] DOB: 07/13/1985   Cancelled Treatment:    Reason Eval/Treat Not Completed: (P) Medical issues which prohibited therapy Pt hemoglobin 6.7. PT will follow back when pt is appropriate for therapy.   Ajah Vanhoose B. Beverely RisenVan Fleet PT, DPT Acute Rehabilitation  [phone] Pager [phone]     Elon Alaslizabeth B Van Fleet 02/20/2017, 8:18 AM

## 2017-02-20 NOTE — Progress Notes (Addendum)
  Echocardiogram Echocardiogram Transesophageal has been performed.  Rankin Coolman L Androw 02/20/2017, 2:40 PM

## 2017-02-20 NOTE — Transfer of Care (Signed)
Immediate Anesthesia Transfer of Care Note  Patient: [name]  Procedure(s) Performed: TRANSESOPHAGEAL ECHOCARDIOGRAM (TEE) (N/A )  Patient Location: Endoscopy Unit  Anesthesia Type:MAC  Level of Consciousness: drowsy and responds to stimulation  Airway & Oxygen Therapy: Patient Spontanous Breathing and Patient connected to nasal cannula oxygen  Post-op Assessment: Report given to RN and Post -op Vital signs reviewed and stable  Post vital signs: Reviewed and stable  Last Vitals:  Vitals:   02/20/17 1237 02/20/17 1421  BP: (!) 94/56 (!) 99/57  Pulse: 84 90  Resp: 20 (!) 29  Temp: 36.9 C   SpO2: 96% 94%    Last Pain:  Vitals:   02/20/17 1421  TempSrc: Oral  PainSc:       Patients Stated Pain Goal: 3 (87/21/58 7276)  Complications: No apparent anesthesia complications

## 2017-02-20 NOTE — Progress Notes (Signed)
OT Cancellation Note  Patient Details Name: Tammy ArabCasey M Fennelly MRN: [id number] DOB: 07/01/1985   Cancelled Treatment:    Reason Eval/Treat Not Completed: Medical issues which prohibited therapy. Pt with hgb of 6.7. Will follow.  Evern BioMayberry, Tifani Dack Lynn 02/20/2017, 8:10 AM  02/20/2017 Martie RoundJulie Danyetta Gillham, OTR/L Pager: [phone]

## 2017-02-20 NOTE — Progress Notes (Signed)
Rehab admissions - Please see rehab consult done yesterday by Dr. Wynn BankerKirsteins [facility].  Call me for questions.  #[phone]

## 2017-02-20 NOTE — Progress Notes (Signed)
Physical Therapy Treatment Patient Details Name: [name] MRN: [id number] DOB: 11/17/1985 Today's Date: 02/20/2017    History of Present Illness Pt is a 32 y.o. female, with past medical history significant for fibromyalgia, anemia and history of IV drug abuse. She presented to the ED with a few days history of fever, chills, and generalized weakness muscle aches in addition to arthralgias.  She was admitted with diagnosis of sepsis.     PT Comments    Pt making steady progress towards her goals, however continues to be limited by LE pain with ambulation. Pt currently supervision for bed mobility, min A for transfers and ambulation of 18 feet with RW. Given pt's improvement in mobility PT currently recommending d/c home with HHPT with 24 hr supervision. PT will continued to follow acutely.   Follow Up Recommendations  Home health PT;Supervision/Assistance - 24 hour     Equipment Recommendations  Rolling walker with 5" wheels       Precautions / Restrictions Precautions Precautions: Fall Restrictions Weight Bearing Restrictions: No    Mobility  Bed Mobility Overal bed mobility: Needs Assistance Bed Mobility: Supine to Sit     Supine to sit: HOB elevated;Supervision     General bed mobility comments: supervision requires increased time and effort  Transfers Overall transfer level: Needs assistance Equipment used: Rolling walker (2 wheeled) Transfers: Sit to/from [employer] CorporationStand;Stand Pivot Transfers Sit to Stand: Min assist         General transfer comment: minA for powerup to RW, increased time  Ambulation/Gait Ambulation/Gait assistance: Min assist Ambulation Distance (Feet): 18 Feet Assistive device: Rolling walker (2 wheeled) Gait Pattern/deviations: Step-through pattern;Decreased stride length Gait velocity: decreased Gait velocity interpretation: Below normal speed for age/gender General Gait Details: minA for stability slow, antalgic gait, no LoB         Balance Overall balance assessment: Needs assistance Sitting-balance support: No upper extremity supported;Feet supported Sitting balance-Leahy Scale: Good     Standing balance support: Bilateral upper extremity supported;During functional activity Standing balance-Leahy Scale: Fair Standing balance comment: heavy reliance on RW                            Cognition Arousal/Alertness: Awake/alert Behavior During Therapy: WFL for tasks assessed/performed Overall Cognitive Status: Within Functional Limits for tasks assessed                                           General Comments General comments (skin integrity, edema, etc.): Mother came during session       Pertinent Vitals/Pain Pain Assessment: Faces Faces Pain Scale: Hurts a little bit Pain Location:  bilateral LE Pain Descriptors / Indicators: Sore;Guarding;Grimacing Pain Intervention(s): Limited activity within patient's tolerance;Monitored during session;Repositioned           PT Goals (current goals can now be found in the care plan section) Acute Rehab PT Goals Patient Stated Goal: get better PT Goal Formulation: With patient Time For Goal Achievement: 03/03/17 Potential to Achieve Goals: Good    Frequency    Min 3X/week      PT Plan Discharge plan needs to be updated       AM-PAC PT "6 Clicks" Daily Activity  Outcome Measure  Difficulty turning over in bed (including adjusting bedclothes, sheets and blankets)?: A Lot Difficulty moving from lying on back to sitting on the  side of the bed? : A Lot Difficulty sitting down on and standing up from a chair with arms (e.g., wheelchair, bedside commode, etc,.)?: A Lot Help needed moving to and from a bed to chair (including a wheelchair)?: A Little Help needed walking in hospital room?: A Little Help needed climbing 3-5 steps with a railing? : A Lot 6 Click Score: 14    End of Session Equipment Utilized During Treatment:  Gait belt Activity Tolerance: Patient tolerated treatment well Patient left: in bed;with call bell/phone within reach Nurse Communication: Mobility status PT Visit Diagnosis: Muscle weakness (generalized) (M62.81);Difficulty in walking, not elsewhere classified (R26.2);Pain Pain - Right/Left: Right Pain - part of body: Hip;Shoulder     Time: 9147-8295 PT Time Calculation (min) (ACUTE ONLY): 17 min  Charges:  $Gait Training: 8-22 mins                    G Codes:       Tenicia Gural B. Beverely Risen PT, DPT Acute Rehabilitation  [phone] Pager [phone]     [name] 02/20/2017, 5:04 PM

## 2017-02-20 NOTE — Interval H&P Note (Signed)
History and Physical Interval Note:  02/20/2017 1:57 PM  [name]  has presented today for surgery, with the diagnosis of bacteremia  The various methods of treatment have been discussed with the patient and family. After consideration of risks, benefits and other options for treatment, the patient has consented to  Procedure(s): TRANSESOPHAGEAL ECHOCARDIOGRAM (TEE) (N/A) as a surgical intervention .  The patient's history has been reviewed, patient examined, no change in status, stable for surgery.  I have reviewed the patient's chart and labs.  Questions were answered to the patient's satisfaction.     Demani Mcbrien [employer]

## 2017-02-20 NOTE — Progress Notes (Signed)
CRITICAL VALUE ALERT  Critical Value:  Hbg 6.7  Date & Time Notied: 02/20/17 0531  Provider Notified: Craige CottaKirby, NP  Orders Received/Actions taken: NP, ordered to transfuse 1 unit PRBCs

## 2017-02-20 NOTE — Progress Notes (Signed)
PROGRESS NOTE  [name] ZDG:[id number] DOB: 12-09-1985 DOA: 02/13/2017 PCP: Pearson Grippe, MD  HPI/Recap of past 72 hours: 32 year old female who presented with a chief complaint weakness.  She does have a significant past medical history for fibromyalgia, chronic anemia and history intravenous drug abuse. Complaint of fever, chills, generalized weakness for the last 2 days prior to hospitalization.  She had erythema and petechiae on her left hand and bilateral lower extremities. Patient was admitted to the hospital with diagnosis of sepsis, due to left hand cellulitis, rule out endocarditis.  Today, pt denies any new complaints, denies any chest pain, SOB, fever/chills. Pt received 1U of PRBC today and also had TEE done today  Assessment/Plan: Principal Problem:   MSSA bacteremia Active Problems:   Sepsis affecting skin (HCC)   Pelvic pain   Cellulitis of left hand   Staphylococcus aureus bacteremia with sepsis (HCC)   Weakness of both lower extremities   Pain of right clavicle   IVDU (intravenous drug user)   Septic arthritis of right sternoclavicular joint (HCC)  Staphylococcus aureus bacteremia, complicated with sepsis Afebrile with leukocytosis BC positive with staph aureus, repeat pending Continue IV Cefazolin ID on board  Tricuspid valve endocarditis with mod-severe TR Pt had TEE on 2/7/9 which showed tricuspid valve endocarditis with ?perforated leaflet, with mod-severe TR. A large PFO was also noted on bubble study ID on board Consult to CTS  Right sternoclavicular joint septic arthritis CT chest showed above after pt c/o R shoulder pain Consult to CTS  Acute on chronic anemia of iron deficiency Hgb 6.7, transfused 1U of PRBC on 02/20/17 Iron panel with iron 14, sats 9% Will transfuse if hgb <7 Continue iron supplements and prophylactic bowel regimen  Urinary tract infection Completed antibiotic therapy.   Hyperkalemia Resolved Daily  BMET  Thrombocytopenia Resolved  IV drug abuse No signs of withdrawal, will continue neuro checks per unit protocol As needed alprazolam, continue suboxone.   Depression Continue cymbalta and olanzapine   Code Status: Full  Family Communication: None at bedside  Disposition Plan: Home   Consultants:  ID  CIR  Procedures:  None  Antimicrobials:  IV Cefazolin   DVT prophylaxis:  Lovenox   Objective: Vitals:   02/20/17 1237 02/20/17 1421 02/20/17 1425 02/20/17 1512  BP: (!) 94/56 (!) 99/57 (!) 99/57 (!) 98/57  Pulse: 84 90 88   Resp: 20 (!) 29 20 18   Temp: 98.4 F (36.9 C) 98.9 F (37.2 C)  98.3 F (36.8 C)  TempSrc: Oral Oral  Oral  SpO2: 96% 94% 98% 94%  Weight:      Height:        Intake/Output Summary (Last 24 hours) at 02/20/2017 2123 Last data filed at 02/20/2017 1745 Gross per 24 hour  Intake 900 ml  Output 1450 ml  Net -550 ml   Filed Weights   02/13/17 2120 02/17/17 1500  Weight: 76.5 kg (168 lb 10.4 oz) 80.7 kg (177 lb 14.6 oz)    Exam:   General:  Alert, awake, oriented, deconditioned  Cardiovascular: S1-S2 present, no added heart sounds  Respiratory: Chest clear bilaterally  Abdomen: Soft, nontender, nondistended, bowel sounds present  Musculoskeletal: No pedal edema bilaterally  Skin: Improved rash on the left hand  Psychiatry: Normal mood   Data Reviewed: CBC: Recent Labs  Lab 02/16/17 0252 02/17/17 0714 02/18/17 0503 02/19/17 0352 02/20/17 0408 02/20/17 1305  WBC 14.3* 14.4* 14.5* 13.2* 11.8*  --   NEUTROABS  --   --   --  8.9* 7.9*  --   HGB 7.7* 7.4* 7.0* 7.1* 6.7* 8.2*  HCT 21.6* 20.8* 20.3* 21.4* 19.7* 24.0*  MCV 84.7 83.9 83.5 84.3 85.7  --   PLT 92* 174 281 340 417*  --    Basic Metabolic Panel: Recent Labs  Lab 02/15/17 0247 02/16/17 0252 02/17/17 0714 02/18/17 0503 02/19/17 0352 02/19/17 0921 02/20/17 0408 02/20/17 1305  NA 133* 136 134* 135 135  --  133* 136  K 3.1* 4.5 5.0 5.0 6.3*  5.2* 4.4 4.5  CL 98* 103 100* 100* 102  --  99*  --   CO2 24 24 20* 22 18*  --  21*  --   GLUCOSE 126* 92 121* 79 74  --  87 83  BUN 8 11 9 10 13   --  13  --   CREATININE 0.88 0.76 0.96 1.06* 1.00  --  1.03*  --   CALCIUM 8.0* 7.9* 8.0* 8.2* 8.7*  --  8.5*  --   MG 2.0  --   --   --   --   --   --   --    GFR: Estimated Creatinine Clearance: 85.7 mL/min (A) (by C-G formula based on SCr of 1.03 mg/dL (H)). Liver Function Tests: Recent Labs  Lab 02/14/17 0305 02/15/17 0247 02/16/17 0252 02/18/17 0503  AST 40 47* 49* 50*  ALT 29 27 22 22   ALKPHOS 132* 153* 186* 173*  BILITOT 2.2* 2.0* 2.1* 1.4*  PROT 5.4* 5.3* 5.1* 6.1*  ALBUMIN 1.8* 1.6* 1.5* 1.6*   No results for input(s): LIPASE, AMYLASE in the last 168 hours. No results for input(s): AMMONIA in the last 168 hours. Coagulation Profile: No results for input(s): INR, PROTIME in the last 168 hours. Cardiac Enzymes: Recent Labs  Lab 02/13/17 2136 02/14/17 0305  TROPONINI <0.03 <0.03   BNP (last 3 results) No results for input(s): PROBNP in the last 8760 hours. HbA1C: No results for input(s): HGBA1C in the last 72 hours. CBG: No results for input(s): GLUCAP in the last 168 hours. Lipid Profile: No results for input(s): CHOL, HDL, LDLCALC, TRIG, CHOLHDL, LDLDIRECT in the last 72 hours. Thyroid Function Tests: No results for input(s): TSH, T4TOTAL, FREET4, T3FREE, THYROIDAB in the last 72 hours. Anemia Panel: No results for input(s): VITAMINB12, FOLATE, FERRITIN, TIBC, IRON, RETICCTPCT in the last 72 hours. Urine analysis:    Component Value Date/Time   COLORURINE AMBER (A) 02/13/2017 0436   APPEARANCEUR HAZY (A) 02/13/2017 0436   LABSPEC 1.014 02/13/2017 0436   PHURINE 5.0 02/13/2017 0436   GLUCOSEU NEGATIVE 02/13/2017 0436   HGBUR LARGE (A) 02/13/2017 0436   BILIRUBINUR NEGATIVE 02/13/2017 0436   KETONESUR NEGATIVE 02/13/2017 0436   PROTEINUR NEGATIVE 02/13/2017 0436   UROBILINOGEN 0.2 12/07/2012 1510   NITRITE  POSITIVE (A) 02/13/2017 0436   LEUKOCYTESUR TRACE (A) 02/13/2017 0436   Sepsis Labs: @LABRCNTIP (procalcitonin:4,lacticidven:4)  ) Recent Results (from the past 240 hour(s))  Culture, blood (Routine x 2)     Status: Abnormal   Collection Time: 02/13/17  4:00 PM  Result Value Ref Range Status   Specimen Description BLOOD RIGHT ANTECUBITAL  Final   Special Requests IN PEDIATRIC BOTTLE Blood Culture adequate volume  Final   Culture  Setup Time   Final    GRAM POSITIVE COCCI IN PEDIATRIC BOTTLE CRITICAL RESULT CALLED TO, READ BACK BY AND VERIFIED WITHMelven Sartorius [facility] 1610 02/14/17 A BROWNING Performed at [facility], [address], [place], [place] [address]  Culture STAPHYLOCOCCUS AUREUS (A)  Final   Report Status 02/17/2017 FINAL  Final   Organism ID, Bacteria STAPHYLOCOCCUS AUREUS  Final      Susceptibility   Staphylococcus aureus - MIC*    CIPROFLOXACIN <=0.5 SENSITIVE Sensitive     ERYTHROMYCIN <=0.25 SENSITIVE Sensitive     GENTAMICIN <=0.5 SENSITIVE Sensitive     OXACILLIN <=0.25 SENSITIVE Sensitive     TETRACYCLINE >=16 RESISTANT Resistant     VANCOMYCIN 1 SENSITIVE Sensitive     TRIMETH/SULFA <=10 SENSITIVE Sensitive     CLINDAMYCIN <=0.25 SENSITIVE Sensitive     RIFAMPIN <=0.5 SENSITIVE Sensitive     Inducible Clindamycin NEGATIVE Sensitive     * STAPHYLOCOCCUS AUREUS  Blood Culture ID Panel (Reflexed)     Status: Abnormal   Collection Time: 02/13/17  4:00 PM  Result Value Ref Range Status   Enterococcus species NOT DETECTED NOT DETECTED Final   Listeria monocytogenes NOT DETECTED NOT DETECTED Final   Staphylococcus species DETECTED (A) NOT DETECTED Final    Comment: CRITICAL RESULT CALLED TO, READ BACK BY AND VERIFIED WITH: [facility] PHARMD [phone] 02/14/17 A BROWNING    Staphylococcus aureus DETECTED (A) NOT DETECTED Final    Comment: Methicillin (oxacillin) susceptible Staphylococcus aureus (MSSA). Preferred therapy is anti staphylococcal beta lactam  antibiotic (Cefazolin or Nafcillin), unless clinically contraindicated. CRITICAL RESULT CALLED TO, READ BACK BY AND VERIFIED WITH: Melven SartoriusJ LEDFORD PHARMD [phone] 02/14/17 A BROWNING    Methicillin resistance NOT DETECTED NOT DETECTED Final   Streptococcus species NOT DETECTED NOT DETECTED Final   Streptococcus agalactiae NOT DETECTED NOT DETECTED Final   Streptococcus pneumoniae NOT DETECTED NOT DETECTED Final   Streptococcus pyogenes NOT DETECTED NOT DETECTED Final   Acinetobacter baumannii NOT DETECTED NOT DETECTED Final   Enterobacteriaceae species NOT DETECTED NOT DETECTED Final   Enterobacter cloacae complex NOT DETECTED NOT DETECTED Final   Escherichia coli NOT DETECTED NOT DETECTED Final   Klebsiella oxytoca NOT DETECTED NOT DETECTED Final   Klebsiella pneumoniae NOT DETECTED NOT DETECTED Final   Proteus species NOT DETECTED NOT DETECTED Final   Serratia marcescens NOT DETECTED NOT DETECTED Final   Haemophilus influenzae NOT DETECTED NOT DETECTED Final   Neisseria meningitidis NOT DETECTED NOT DETECTED Final   Pseudomonas aeruginosa NOT DETECTED NOT DETECTED Final   Candida albicans NOT DETECTED NOT DETECTED Final   Candida glabrata NOT DETECTED NOT DETECTED Final   Candida krusei NOT DETECTED NOT DETECTED Final   Candida parapsilosis NOT DETECTED NOT DETECTED Final   Candida tropicalis NOT DETECTED NOT DETECTED Final    Comment: Performed at [facility], [address] [place], [place] [address]  Culture, blood (Routine x 2)     Status: Abnormal   Collection Time: 02/13/17  5:00 PM  Result Value Ref Range Status   Specimen Description BLOOD RIGHT ANTECUBITAL  Final   Special Requests   Final    BOTTLES DRAWN AEROBIC AND ANAEROBIC Blood Culture adequate volume   Culture  Setup Time   Final    GRAM POSITIVE COCCI IN CLUSTERS IN BOTH AEROBIC AND ANAEROBIC BOTTLES CRITICAL VALUE NOTED.  VALUE IS CONSISTENT WITH PREVIOUSLY REPORTED AND CALLED VALUE.    Culture (A)  Final     STAPHYLOCOCCUS AUREUS SUSCEPTIBILITIES PERFORMED ON PREVIOUS CULTURE WITHIN THE LAST 5 DAYS. Performed at [facility], [address] [place], [place] [address]    Report Status 02/16/2017 FINAL  Final  MRSA PCR Screening     Status: None  Collection Time: 02/13/17  9:32 PM  Result Value Ref Range Status   MRSA by PCR NEGATIVE NEGATIVE Final    Comment:        The GeneXpert MRSA Assay (FDA approved for NASAL specimens only), is one component of a comprehensive MRSA colonization surveillance program. It is not intended to diagnose MRSA infection nor to guide or monitor treatment for MRSA infections.   Culture, blood (Routine X 2) w Reflex to ID Panel     Status: Abnormal   Collection Time: 02/14/17 10:07 AM  Result Value Ref Range Status   Specimen Description BLOOD RIGHT HAND  Final   Special Requests IN PEDIATRIC BOTTLE Blood Culture adequate volume  Final   Culture  Setup Time   Final    GRAM POSITIVE COCCI IN PEDIATRIC BOTTLE CRITICAL VALUE NOTED.  VALUE IS CONSISTENT WITH PREVIOUSLY REPORTED AND CALLED VALUE.    Culture (A)  Final    STAPHYLOCOCCUS AUREUS SUSCEPTIBILITIES PERFORMED ON PREVIOUS CULTURE WITHIN THE LAST 5 DAYS. Performed at [facility], [address], [place], [place] [address]    Report Status 02/17/2017 FINAL  Final  Culture, blood (Routine X 2) w Reflex to ID Panel     Status: Abnormal   Collection Time: 02/17/17 10:10 AM  Result Value Ref Range Status   Specimen Description BLOOD RIGHT HAND  Final   Special Requests IN PEDIATRIC BOTTLE Blood Culture adequate volume  Final   Culture  Setup Time   Final    GRAM POSITIVE COCCI IN PEDIATRIC BOTTLE CRITICAL VALUE NOTED.  VALUE IS CONSISTENT WITH PREVIOUSLY REPORTED AND CALLED VALUE.    Culture (A)  Final    STAPHYLOCOCCUS AUREUS SUSCEPTIBILITIES PERFORMED ON PREVIOUS CULTURE WITHIN THE LAST 5 DAYS. Performed at [facility], [address] [place], [place] [address]    Report  Status 02/19/2017 FINAL  Final  Culture, blood (Routine X 2) w Reflex to ID Panel     Status: None (Preliminary result)   Collection Time: 02/17/17 10:15 AM  Result Value Ref Range Status   Specimen Description BLOOD LEFT ANTECUBITAL  Final   Special Requests IN PEDIATRIC BOTTLE Blood Culture adequate volume  Final   Culture   Final    NO GROWTH 3 DAYS Performed at [facility], [address] [place], [place] [address]    Report Status PENDING  Incomplete  Culture, blood (Routine X 2) w Reflex to ID Panel     Status: None (Preliminary result)   Collection Time: 02/19/17  9:15 AM  Result Value Ref Range Status   Specimen Description BLOOD LEFT ANTECUBITAL  Final   Special Requests   Final    BOTTLES DRAWN AEROBIC AND ANAEROBIC Blood Culture adequate volume   Culture   Final    NO GROWTH 1 DAY Performed at [facility], [address] [place], [place] [address]    Report Status PENDING  Incomplete  Culture, blood (Routine X 2) w Reflex to ID Panel     Status: None (Preliminary result)   Collection Time: 02/19/17  9:30 AM  Result Value Ref Range Status   Specimen Description BLOOD RIGHT ANTECUBITAL  Final   Special Requests   Final    BOTTLES DRAWN AEROBIC AND ANAEROBIC Blood Culture adequate volume   Culture   Final    NO GROWTH 1 DAY Performed at [facility], [address] [place], [place] [address]    Report Status PENDING  Incomplete      Studies: Ct Chest W Contrast  Result Date: 02/20/2017 CLINICAL DATA:  Acute onset of generalized chest pain. Assess for sternoclavicular septic arthritis. EXAM: CT CHEST WITH CONTRAST TECHNIQUE: Multidetector CT imaging of the chest was performed during intravenous contrast administration. CONTRAST:  75 mL ISOVUE-300 IOPAMIDOL (ISOVUE-300) INJECTION 61% COMPARISON:  MRI of the thoracic spine performed 02/17/2017 FINDINGS: Cardiovascular: The heart is borderline normal in size. The thoracic aorta is grossly unremarkable.  A retroesophageal right subclavian artery is incidentally noted. The great vessels are unremarkable in appearance. Mediastinum/Nodes: Enlarged mediastinal nodes are noted at the subcarinal and azygoesophageal regions, aortopulmonary window, right paratracheal region and periaortic region, measuring up to 1.4 cm in short axis. Mildly prominent bilateral hilar nodes measure up to the 1.2 cm in short axis. No pericardial effusion is identified. The visualized portions of the thyroid gland are unremarkable. No axillary lymphadenopathy is appreciated. Lungs/Pleura: There is patchy airspace opacification involving the lower lobes bilaterally. Scattered nodular opacities are seen throughout both lungs. A small cavitary nodule is suggested at the right lung apex. This may reflect diffuse infection, possibly atypical in nature. Underlying metastatic disease cannot be excluded. Trace bilateral pleural fluid is noted.  No pneumothorax is seen. Upper Abdomen: The visualized portions of the liver are unremarkable. The spleen is enlarged, measuring 14.6 cm in length. The patient is status post cholecystectomy, with clips noted at the gallbladder fossa. The visualized portions of the pancreas and adrenal glands are within normal limits. Musculoskeletal: There is asymmetric prominence of soft tissue density tracking about the right sternoclavicular joint, with mild underlying soft tissue inflammation. Given clinical concern, this could reflect right sternoclavicular joint septic arthritis. IMPRESSION: 1. Patchy airspace opacification involving the lower lung lobes bilaterally. Scattered nodular opacities throughout both lungs. Small cavitary nodule suggested at the right lung apex. Findings are concerning for diffuse infection, possibly atypical in nature. Underlying metastatic disease cannot be excluded. Would correlate clinically, and perform follow-up CT of the chest 3-4 weeks after completion of treatment for pneumonia. 2.  Trace bilateral pleural fluid noted. 3. Asymmetric prominence of soft tissue density tracking about the right sternoclavicular joint, with mild underlying soft tissue inflammation. Given clinical concern, this could reflect right-sided clavicular joint septic arthritis. 4. Diffusely prominent mediastinal and bilateral hilar nodes noted. This may reflect the underlying infection. 5. Retroesophageal right subclavian artery incidentally noted. Electronically Signed   By: Roanna Raider M.D.   On: 02/20/2017 01:40    Scheduled Meds: . buprenorphine-naloxone  2 tablet Sublingual Daily  . DULoxetine  60 mg Oral BID  . enoxaparin (LOVENOX) injection  40 mg Subcutaneous Q24H  . feeding supplement (ENSURE ENLIVE)  237 mL Oral BID BM  . ferrous sulfate  325 mg Oral TID WC  . multivitamin with minerals  1 tablet Oral Daily  . OLANZapine  5 mg Oral q morning - 10a  . polyethylene glycol  17 g Oral Daily  . senna-docusate  1 tablet Oral BID    Continuous Infusions: .  ceFAZolin (ANCEF) IV Stopped (02/20/17 1624)     LOS: 7 days     Briant Cedar, MD [facility]  If 7PM-7AM, please contact night-coverage [email or web address] Password [facility] 02/20/2017, 9:23 PM

## 2017-02-20 NOTE — Anesthesia Preprocedure Evaluation (Addendum)
Anesthesia Evaluation  Patient identified by MRN, date of birth, ID band Patient awake    Reviewed: Allergy & Precautions, NPO status , Patient's Chart, lab work & pertinent test results  Airway Mallampati: II  TM Distance: >3 FB     Dental   Pulmonary asthma , Current Smoker,    breath sounds clear to auscultation       Cardiovascular (-) Valvular Problems/Murmurs Rhythm:Regular Rate:Normal  History noted   Neuro/Psych    GI/Hepatic Neg liver ROS, GERD  ,  Endo/Other  negative endocrine ROS  Renal/GU negative Renal ROS     Musculoskeletal   Abdominal   Peds  Hematology   Anesthesia Other Findings   Reproductive/Obstetrics                            Anesthesia Physical Anesthesia Plan  ASA: III  Anesthesia Plan: General   Post-op Pain Management:    Induction: Intravenous  PONV Risk Score and Plan:   Airway Management Planned: Nasal Cannula and Simple Face Mask  Additional Equipment:   Intra-op Plan:   Post-operative Plan:   Informed Consent: I have reviewed the patients History and Physical, chart, labs and discussed the procedure including the risks, benefits and alternatives for the proposed anesthesia with the patient or authorized representative who has indicated his/her understanding and acceptance.   Dental advisory given  Plan Discussed with: CRNA, Anesthesiologist and Surgeon  Anesthesia Plan Comments:         Anesthesia Quick Evaluation

## 2017-02-20 NOTE — Progress Notes (Signed)
Nutrition Follow-up  DOCUMENTATION CODES:   Not applicable  INTERVENTION:  1. Ensure Enlive po BID, each supplement provides 350 kcal and 20 grams of protein  NUTRITION DIAGNOSIS:   Inadequate oral intake related to lethargy/confusion, poor appetite as evidenced by meal completion < 50%. -improving  GOAL:   Patient will meet greater than or equal to 90% of their needs -progressing  MONITOR:   PO intake, Supplement acceptance, Labs, Weight trends, Skin, I & O's  REASON FOR ASSESSMENT:   Malnutrition Screening Tool    ASSESSMENT:   32 y.o. female w/ a hx of fibromyalgia, anemia, and IV drug abuse who presented with a few days history of fever, chills, generalized weakness muscle aches, and arthralgias.  In the emergency room the patient was noted to have fever, leukocytosis and left hand and bilateral lower extremity redness and petechiae.    MSSA bacteremia Found to have tricuspid valve endocarditis, possible perforated leaflet on TEE.  [name] is feeling ok, was NPO this morning but ate a Cheeseburger and fries from cookout for dinner yesterday. Some nausea, appetite ok.  Labs reviewed Medications reviewed and include:  Iron, MVI w/ minerals, Miralax, Senokot-S  Diet Order:  Diet Heart Room service appropriate? Yes; Fluid consistency: Thin  EDUCATION NEEDS:   Education needs have been addressed  Skin:  Skin Assessment: Reviewed RN Assessment  Last BM:  02/17/2017  Height:   Ht Readings from Last 1 Encounters:  02/17/17 5\' 7"  (1.702 m)    Weight:   Wt Readings from Last 1 Encounters:  02/17/17 177 lb 14.6 oz (80.7 kg)    Ideal Body Weight:  61.4 kg  BMI:  Body mass index is 27.86 kg/m.  Estimated Nutritional Needs:   Kcal:  1700-1900  Protein:  90-105 grams  Fluid:  1.7-1.9 L  Dionne AnoWilliam M. Taevion Sikora, MS, RD LDN Inpatient Clinical Dietitian Pager [phone]

## 2017-02-20 NOTE — Progress Notes (Signed)
Subjective:  No new complaints   Antibiotics:  Anti-infectives (From admission, onward)   Start     Dose/Rate Route Frequency Ordered Stop   02/19/17 1230  ceFAZolin (ANCEF) IVPB 2g/100 mL premix     2 g 200 mL/hr over 30 Minutes Intravenous Every 8 hours 02/19/17 1148     02/18/17 1330  linezolid (ZYVOX) tablet 600 mg  Status:  Discontinued     600 mg Oral Every 12 hours 02/18/17 1250 02/19/17 1126   02/17/17 0800  sulfamethoxazole-trimethoprim (BACTRIM DS,SEPTRA DS) 800-160 MG per tablet 2 tablet  Status:  Discontinued     2 tablet Oral Every 12 hours 02/16/17 1453 02/18/17 1250   02/16/17 1600  sulfamethoxazole-trimethoprim (BACTRIM DS,SEPTRA DS) 800-160 MG per tablet 2 tablet     2 tablet Oral  Once 02/16/17 1509 02/16/17 1657   02/14/17 1400  ceFAZolin (ANCEF) IVPB 2g/100 mL premix  Status:  Discontinued     2 g 200 mL/hr over 30 Minutes Intravenous Every 8 hours 02/14/17 1013 02/16/17 1453   02/14/17 0200  vancomycin (VANCOCIN) IVPB 750 mg/150 ml premix  Status:  Discontinued     750 mg 150 mL/hr over 60 Minutes Intravenous Every 8 hours 02/13/17 1739 02/14/17 1017   02/14/17 0000  piperacillin-tazobactam (ZOSYN) IVPB 3.375 g  Status:  Discontinued     3.375 g 12.5 mL/hr over 240 Minutes Intravenous Every 8 hours 02/13/17 1739 02/14/17 1013   02/13/17 1715  piperacillin-tazobactam (ZOSYN) IVPB 3.375 g     3.375 g 100 mL/hr over 30 Minutes Intravenous  Once 02/13/17 1701 02/13/17 1824   02/13/17 1715  vancomycin (VANCOCIN) IVPB 1000 mg/200 mL premix  Status:  Discontinued     1,000 mg 200 mL/hr over 60 Minutes Intravenous  Once 02/13/17 1701 02/13/17 1705   02/13/17 1715  vancomycin (VANCOCIN) 1,500 mg in sodium chloride 0.9 % 500 mL IVPB     1,500 mg 250 mL/hr over 120 Minutes Intravenous  Once 02/13/17 1705 02/13/17 2300      Medications: Scheduled Meds: . buprenorphine-naloxone  2 tablet Sublingual Daily  . DULoxetine  60 mg Oral BID  . enoxaparin  (LOVENOX) injection  40 mg Subcutaneous Q24H  . feeding supplement  1 Container Oral TID BM  . ferrous sulfate  325 mg Oral TID WC  . multivitamin with minerals  1 tablet Oral Daily  . OLANZapine  5 mg Oral q morning - 10a  . polyethylene glycol  17 g Oral Daily  . senna-docusate  1 tablet Oral BID   Continuous Infusions: .  ceFAZolin (ANCEF) IV Stopped (02/20/17 0630)   PRN Meds:.acetaminophen, ALPRAZolam, cyclobenzaprine, ondansetron **OR** ondansetron (ZOFRAN) IV    Objective: Weight change:   Intake/Output Summary (Last 24 hours) at 02/20/2017 1115 Last data filed at 02/20/2017 1100 Gross per 24 hour  Intake 840 ml  Output -  Net 840 ml   Blood pressure (!) 93/58, pulse 82, temperature 98.3 F (36.8 C), temperature source Oral, resp. rate 18, height 5\' 7"  (1.702 m), weight 177 lb 14.6 oz (80.7 kg), last menstrual period 01/28/2017, SpO2 93 %. Temp:  [98.3 F (36.8 C)-98.7 F (37.1 C)] 98.3 F (36.8 C) (02/07 1103) Pulse Rate:  [82-102] 82 (02/07 1103) Resp:  [16-18] 18 (02/07 1103) BP: (92-99)/(57-61) 93/58 (02/07 1103) SpO2:  [92 %-93 %] 93 % (02/07 0557)  Physical Exam: General: Alert and awake, oriented x3,and dysphoric HEENT: anicteric sclera,EOMI CVS regular rate, normal r,  no murmur rubs or gallops Chest: clear to auscultation bilaterally, no wheezing, rales or rhonchi Abdomen: soft nontender, nondistended, normal bowel sounds, Extremities: she has minimal tenderness of clavicle Skin: rashes resolving on feet  Neuro: nonfocal  CBC:  CBC Latest Ref Rng & Units 02/20/2017 02/19/2017 02/18/2017  WBC 4.0 - 10.5 K/uL 11.8(H) 13.2(H) 14.5(H)  Hemoglobin 12.0 - 15.0 g/dL 6.7(LL) 7.1(L) 7.0(L)  Hematocrit 36.0 - 46.0 % 19.7(L) 21.4(L) 20.3(L)  Platelets 150 - 400 K/uL 417(H) 340 281      BMET Recent Labs    02/19/17 0352 02/19/17 0921 02/20/17 0408  NA 135  --  133*  K 6.3* 5.2* 4.4  CL 102  --  99*  CO2 18*  --  21*  GLUCOSE 74  --  87  BUN 13  --  13    CREATININE 1.00  --  1.03*  CALCIUM 8.7*  --  8.5*     Liver Panel  Recent Labs    02/18/17 0503  PROT 6.1*  ALBUMIN 1.6*  AST 50*  ALT 22  ALKPHOS 173*  BILITOT 1.4*       Sedimentation Rate No results for input(s): ESRSEDRATE in the last 72 hours. C-Reactive Protein No results for input(s): CRP in the last 72 hours.  Micro Results: Recent Results (from the past 720 hour(s))  Culture, blood (Routine x 2)     Status: Abnormal   Collection Time: 02/13/17  4:00 PM  Result Value Ref Range Status   Specimen Description BLOOD RIGHT ANTECUBITAL  Final   Special Requests IN PEDIATRIC BOTTLE Blood Culture adequate volume  Final   Culture  Setup Time   Final    GRAM POSITIVE COCCI IN PEDIATRIC BOTTLE CRITICAL RESULT CALLED TO, READ BACK BY AND VERIFIED WITHMelven Sartorius [facility] [place] 1308 02/14/17 A BROWNING Performed at [facility], [address], [place], [place] [address]    Culture STAPHYLOCOCCUS AUREUS (A)  Final   Report Status 02/17/2017 FINAL  Final   Organism ID, Bacteria STAPHYLOCOCCUS AUREUS  Final      Susceptibility   Staphylococcus aureus - MIC*    CIPROFLOXACIN <=0.5 SENSITIVE Sensitive     ERYTHROMYCIN <=0.25 SENSITIVE Sensitive     GENTAMICIN <=0.5 SENSITIVE Sensitive     OXACILLIN <=0.25 SENSITIVE Sensitive     TETRACYCLINE >=16 RESISTANT Resistant     VANCOMYCIN 1 SENSITIVE Sensitive     TRIMETH/SULFA <=10 SENSITIVE Sensitive     CLINDAMYCIN <=0.25 SENSITIVE Sensitive     RIFAMPIN <=0.5 SENSITIVE Sensitive     Inducible Clindamycin NEGATIVE Sensitive     * STAPHYLOCOCCUS AUREUS  Blood Culture ID Panel (Reflexed)     Status: Abnormal   Collection Time: 02/13/17  4:00 PM  Result Value Ref Range Status   Enterococcus species NOT DETECTED NOT DETECTED Final   Listeria monocytogenes NOT DETECTED NOT DETECTED Final   Staphylococcus species DETECTED (A) NOT DETECTED Final    Comment: CRITICAL RESULT CALLED TO, READ BACK BY AND VERIFIED WITH: J [facility]  PHARMD [id number] 02/14/17 A BROWNING    Staphylococcus aureus DETECTED (A) NOT DETECTED Final    Comment: Methicillin (oxacillin) susceptible Staphylococcus aureus (MSSA). Preferred therapy is anti staphylococcal beta lactam antibiotic (Cefazolin or Nafcillin), unless clinically contraindicated. CRITICAL RESULT CALLED TO, READ BACK BY AND VERIFIED WITH: J [facility] PHARMD [id number] 02/14/17 A BROWNING    Methicillin resistance NOT DETECTED NOT DETECTED Final   Streptococcus species NOT DETECTED NOT DETECTED Final   Streptococcus agalactiae NOT  DETECTED NOT DETECTED Final   Streptococcus pneumoniae NOT DETECTED NOT DETECTED Final   Streptococcus pyogenes NOT DETECTED NOT DETECTED Final   Acinetobacter baumannii NOT DETECTED NOT DETECTED Final   Enterobacteriaceae species NOT DETECTED NOT DETECTED Final   Enterobacter cloacae complex NOT DETECTED NOT DETECTED Final   Escherichia coli NOT DETECTED NOT DETECTED Final   Klebsiella oxytoca NOT DETECTED NOT DETECTED Final   Klebsiella pneumoniae NOT DETECTED NOT DETECTED Final   Proteus species NOT DETECTED NOT DETECTED Final   Serratia marcescens NOT DETECTED NOT DETECTED Final   Haemophilus influenzae NOT DETECTED NOT DETECTED Final   Neisseria meningitidis NOT DETECTED NOT DETECTED Final   Pseudomonas aeruginosa NOT DETECTED NOT DETECTED Final   Candida albicans NOT DETECTED NOT DETECTED Final   Candida glabrata NOT DETECTED NOT DETECTED Final   Candida krusei NOT DETECTED NOT DETECTED Final   Candida parapsilosis NOT DETECTED NOT DETECTED Final   Candida tropicalis NOT DETECTED NOT DETECTED Final    Comment: Performed at [facility], [address] [place], [place] [address]  Culture, blood (Routine x 2)     Status: Abnormal   Collection Time: 02/13/17  5:00 PM  Result Value Ref Range Status   Specimen Description BLOOD RIGHT ANTECUBITAL  Final   Special Requests   Final    BOTTLES DRAWN AEROBIC AND ANAEROBIC Blood Culture adequate volume    Culture  Setup Time   Final    GRAM POSITIVE COCCI IN CLUSTERS IN BOTH AEROBIC AND ANAEROBIC BOTTLES CRITICAL VALUE NOTED.  VALUE IS CONSISTENT WITH PREVIOUSLY REPORTED AND CALLED VALUE.    Culture (A)  Final    STAPHYLOCOCCUS AUREUS SUSCEPTIBILITIES PERFORMED ON PREVIOUS CULTURE WITHIN THE LAST 5 DAYS. Performed at [facility], [address] [place], [place] [address]    Report Status 02/16/2017 FINAL  Final  MRSA PCR Screening     Status: None   Collection Time: 02/13/17  9:32 PM  Result Value Ref Range Status   MRSA by PCR NEGATIVE NEGATIVE Final    Comment:        The GeneXpert MRSA Assay (FDA approved for NASAL specimens only), is one component of a comprehensive MRSA colonization surveillance program. It is not intended to diagnose MRSA infection nor to guide or monitor treatment for MRSA infections.   Culture, blood (Routine X 2) w Reflex to ID Panel     Status: Abnormal   Collection Time: 02/14/17 10:07 AM  Result Value Ref Range Status   Specimen Description BLOOD RIGHT HAND  Final   Special Requests IN PEDIATRIC BOTTLE Blood Culture adequate volume  Final   Culture  Setup Time   Final    GRAM POSITIVE COCCI IN PEDIATRIC BOTTLE CRITICAL VALUE NOTED.  VALUE IS CONSISTENT WITH PREVIOUSLY REPORTED AND CALLED VALUE.    Culture (A)  Final    STAPHYLOCOCCUS AUREUS SUSCEPTIBILITIES PERFORMED ON PREVIOUS CULTURE WITHIN THE LAST 5 DAYS. Performed at [facility], [address] [place], [place] [address]    Report Status 02/17/2017 FINAL  Final  Culture, blood (Routine X 2) w Reflex to ID Panel     Status: Abnormal   Collection Time: 02/17/17 10:10 AM  Result Value Ref Range Status   Specimen Description BLOOD RIGHT HAND  Final   Special Requests IN PEDIATRIC BOTTLE Blood Culture adequate volume  Final   Culture  Setup Time   Final    GRAM POSITIVE COCCI IN PEDIATRIC BOTTLE CRITICAL VALUE NOTED.  VALUE IS CONSISTENT WITH PREVIOUSLY REPORTED AND CALLED  VALUE.    Culture (A)  Final    STAPHYLOCOCCUS AUREUS SUSCEPTIBILITIES PERFORMED ON PREVIOUS CULTURE WITHIN THE LAST 5 DAYS. Performed at [facility], [address] [place], [place] [address]    Report Status 02/19/2017 FINAL  Final  Culture, blood (Routine X 2) w Reflex to ID Panel     Status: None (Preliminary result)   Collection Time: 02/17/17 10:15 AM  Result Value Ref Range Status   Specimen Description BLOOD LEFT ANTECUBITAL  Final   Special Requests IN PEDIATRIC BOTTLE Blood Culture adequate volume  Final   Culture   Final    NO GROWTH 2 DAYS Performed at [facility], [address] [place], [place] [address]    Report Status PENDING  Incomplete    Studies/Results: Ct Chest W Contrast  Result Date: 02/20/2017 CLINICAL DATA:  Acute onset of generalized chest pain. Assess for sternoclavicular septic arthritis. EXAM: CT CHEST WITH CONTRAST TECHNIQUE: Multidetector CT imaging of the chest was performed during intravenous contrast administration. CONTRAST:  75 mL ISOVUE-300 IOPAMIDOL (ISOVUE-300) INJECTION 61% COMPARISON:  MRI of the thoracic spine performed 02/17/2017 FINDINGS: Cardiovascular: The heart is borderline normal in size. The thoracic aorta is grossly unremarkable. A retroesophageal right subclavian artery is incidentally noted. The great vessels are unremarkable in appearance. Mediastinum/Nodes: Enlarged mediastinal nodes are noted at the subcarinal and azygoesophageal regions, aortopulmonary window, right paratracheal region and periaortic region, measuring up to 1.4 cm in short axis. Mildly prominent bilateral hilar nodes measure up to the 1.2 cm in short axis. No pericardial effusion is identified. The visualized portions of the thyroid gland are unremarkable. No axillary lymphadenopathy is appreciated. Lungs/Pleura: There is patchy airspace opacification involving the lower lobes bilaterally. Scattered nodular opacities are seen throughout both lungs. A  small cavitary nodule is suggested at the right lung apex. This may reflect diffuse infection, possibly atypical in nature. Underlying metastatic disease cannot be excluded. Trace bilateral pleural fluid is noted.  No pneumothorax is seen. Upper Abdomen: The visualized portions of the liver are unremarkable. The spleen is enlarged, measuring 14.6 cm in length. The patient is status post cholecystectomy, with clips noted at the gallbladder fossa. The visualized portions of the pancreas and adrenal glands are within normal limits. Musculoskeletal: There is asymmetric prominence of soft tissue density tracking about the right sternoclavicular joint, with mild underlying soft tissue inflammation. Given clinical concern, this could reflect right sternoclavicular joint septic arthritis. IMPRESSION: 1. Patchy airspace opacification involving the lower lung lobes bilaterally. Scattered nodular opacities throughout both lungs. Small cavitary nodule suggested at the right lung apex. Findings are concerning for diffuse infection, possibly atypical in nature. Underlying metastatic disease cannot be excluded. Would correlate clinically, and perform follow-up CT of the chest 3-4 weeks after completion of treatment for pneumonia. 2. Trace bilateral pleural fluid noted. 3. Asymmetric prominence of soft tissue density tracking about the right sternoclavicular joint, with mild underlying soft tissue inflammation. Given clinical concern, this could reflect right-sided clavicular joint septic arthritis. 4. Diffusely prominent mediastinal and bilateral hilar nodes noted. This may reflect the underlying infection. 5. Retroesophageal right subclavian artery incidentally noted. Electronically Signed   By: Roanna Raider M.D.   On: 02/20/2017 01:40      Assessment/Plan:  INTERVAL HISTORY:  CT shows possible sternoclavicular septic arthritis  Principal Problem:   MSSA bacteremia Active Problems:   Sepsis affecting skin (HCC)    Pelvic pain   Cellulitis  of left hand   Staphylococcus aureus bacteremia with sepsis (HCC)   Weakness of both lower extremities   Pain of right clavicle   IVDU (intravenous drug user)   Septic arthritis of right sternoclavicular joint (HCC)    [name] is a 32 y.o. female with  IVDU, MSSA bacteremia and sepsis with shoulder (clavicle pain), tender area on her left hand and LE weakness  #1      [facility] Antimicrobial Management Team Staphylococcus aureus bacteremia   Staphylococcus aureus bacteremia (SAB) is associated with a high rate of complications and mortality.  Specific aspects of clinical management are critical to optimizing the outcome of patients with SAB.  Therefore, the [facility] Antimicrobial Management Team [facility]) has initiated an intervention aimed at improving the management of SAB at [facility].  To do so, Infectious Diseases physicians are providing an evidence-based consult for the management of all patients with SAB.     Yes No Comments  Perform follow-up blood cultures (even if the patient is afebrile) to ensure clearance of bacteremia [x]  []  02/19/17 culture sent  Remove vascular catheter and obtain follow-up blood cultures after the removal of the catheter []  []  DO NOT PLACE PICC  Perform echocardiography to evaluate for endocarditis (transthoracic ECHO is 40-50% sensitive, TEE is > 90% sensitive) []  []  Please keep in mind, that neither test can definitively EXCLUDE endocarditis, and that should clinical suspicion remain high for endocarditis the patient should then still be treated with an "endocarditis" duration of therapy = 6 weeks  TEE happening now  Consult electrophysiologist to evaluate implanted cardiac device (pacemaker, ICD) []  []    Ensure source control []  []  Have all abscesses been drained effectively? Have deep seeded infections (septic joints or osteomyelitis) had appropriate surgical debridement?  SHE NEEDS TO BE SEEN BY CARDIOTHORACIC  SURGERY  Investigate for "metastatic" sites of infection []  []  Does the patient have ANY symptom or physical exam finding that would suggest a deeper infection (back or neck pain that may be suggestive of vertebral osteomyelitis or epidural abscess, muscle pain that could be a symptom of pyomyositis)?  Keep in mind that for deep seeded infections MRI imaging with contrast is preferred rather than other often insensitive tests such as plain x-rays, especially early in a patient's presentation. SEE ABOVE   Change antibiotic therapy to cefazolin 2 g IV every 8 hours times 8 weeks []  []  Beta-lactam antibiotics are preferred for MSSA due to higher cure rates.   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

## 2017-02-20 NOTE — CV Procedure (Signed)
Procedure: TEE  Sedation: Per anesthesiology  Indication: MSSA bacteremia, assess for endocarditis  Findings: Please see echo section for full report.  Normal left ventricular size and systolic function, EF 60-65%.  Normal wall motion.  Normal wall thickness.  The right ventricle was mildly dilated with normal systolic function.  Normal left atrium with no LA appendage thrombus.  Mildly dilated right atrium.  There was a large PFO present, positive bubble study.  Normal mitral valve, no regurgitation or stenosis, no vegetation.  Normal trileaflet aortic valve, no regurgitation or stenosis, no vegetation.  Normal pulmonary valve without endocarditis.  The tricuspid valve had a 1.8 x 1.7 cm vegetation.  There was moderate to severe tricuspid regurgitation, cannot rule out perforated leaflet.    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  There was a large PFO, putting the patient at risk for spread of infection from venous circulation to arterial circulation and putting the mitral and aortic valves at higher risk.   Marca AnconaDalton Harrol Novello 02/20/2017 2:21 PM

## 2017-02-20 NOTE — Anesthesia Procedure Notes (Signed)
Procedure Name: MAC Date/Time: 02/20/2017 1:55 PM Performed by: White, Amedeo Plenty, CRNA Pre-anesthesia Checklist: Patient identified, Emergency Drugs available, Suction available, Timeout performed and Patient being monitored Patient Re-evaluated:Patient Re-evaluated prior to induction Oxygen Delivery Method: Nasal cannula

## 2017-02-21 ENCOUNTER — Encounter (HOSPITAL_COMMUNITY): Payer: Self-pay | Admitting: Cardiology

## 2017-02-21 DIAGNOSIS — I361 Nonrheumatic tricuspid (valve) insufficiency: Secondary | ICD-10-CM

## 2017-02-21 DIAGNOSIS — I339 Acute and subacute endocarditis, unspecified: Secondary | ICD-10-CM

## 2017-02-21 DIAGNOSIS — Q211 Atrial septal defect: Secondary | ICD-10-CM

## 2017-02-21 DIAGNOSIS — I079 Rheumatic tricuspid valve disease, unspecified: Secondary | ICD-10-CM

## 2017-02-21 LAB — CBC WITH DIFFERENTIAL/PLATELET
BASOS ABS: 0 10*3/uL (ref 0.0–0.1)
Basophils Relative: 0 %
Eosinophils Absolute: 0.1 10*3/uL (ref 0.0–0.7)
Eosinophils Relative: 1 %
HEMATOCRIT: 22.9 % — AB (ref 36.0–46.0)
HEMOGLOBIN: 7.5 g/dL — AB (ref 12.0–15.0)
LYMPHS ABS: 2.5 10*3/uL (ref 0.7–4.0)
LYMPHS PCT: 28 %
MCH: 28.6 pg (ref 26.0–34.0)
MCHC: 32.8 g/dL (ref 30.0–36.0)
MCV: 87.4 fL (ref 78.0–100.0)
MONOS PCT: 5 %
Monocytes Absolute: 0.4 10*3/uL (ref 0.1–1.0)
Neutro Abs: 5.8 10*3/uL (ref 1.7–7.7)
Neutrophils Relative %: 66 %
Platelets: 387 10*3/uL (ref 150–400)
RBC: 2.62 MIL/uL — AB (ref 3.87–5.11)
RDW: 15.6 % — ABNORMAL HIGH (ref 11.5–15.5)
WBC: 8.8 10*3/uL (ref 4.0–10.5)

## 2017-02-21 LAB — BASIC METABOLIC PANEL
Anion gap: 13 (ref 5–15)
BUN: 14 mg/dL (ref 6–20)
CO2: 21 mmol/L — ABNORMAL LOW (ref 22–32)
Calcium: 8.9 mg/dL (ref 8.9–10.3)
Chloride: 99 mmol/L — ABNORMAL LOW (ref 101–111)
Creatinine, Ser: 0.83 mg/dL (ref 0.44–1.00)
GFR calc Af Amer: >60 mL/min (ref >60)
GFR calc non Af Amer: >60 mL/min (ref >60)
Glucose, Bld: 89 mg/dL (ref 65–99)
Potassium: 4.3 mmol/L (ref 3.5–5.1)
Sodium: 133 mmol/L — ABNORMAL LOW (ref 135–145)

## 2017-02-21 LAB — TYPE AND SCREEN
ABO/RH(D): O POS
ANTIBODY SCREEN: NEGATIVE
UNIT DIVISION: 0

## 2017-02-21 LAB — BPAM RBC
Blood Product Expiration Date: 201903112359
ISSUE DATE / TIME: 201902070754
Unit Type and Rh: 5100

## 2017-02-21 MED ORDER — NAFCILLIN SODIUM 2 G IJ SOLR
2.0000 g | INTRAVENOUS | Status: DC
  Administered 2017-02-21 – 2017-02-24 (×16): 2 g via INTRAVENOUS
  Filled 2017-02-21 (×18): qty 2000

## 2017-02-21 MED ORDER — NAFCILLIN SODIUM 2 G IJ SOLR: 2.0000 g | INTRAMUSCULAR | Status: DC

## 2017-02-21 NOTE — Progress Notes (Signed)
PROGRESS NOTE  [name] [name]:[id number] DOB: 04-Jun-1985 DOA: 02/13/2017 PCP: Pearson Grippe, MD  HPI/Recap of past 5 hours: 32 year old female who presented with a chief complaint weakness.  She does have a significant past medical history for fibromyalgia, chronic anemia and history intravenous drug abuse. Complaint of fever, chills, generalized weakness for the last 2 days prior to hospitalization.  She had erythema and petechiae on her left hand and bilateral lower extremities. Patient was admitted to the hospital with diagnosis of sepsis, due to left hand cellulitis, rule out endocarditis.  Today, patient reported no new complaints, denies any chest pain, shortness of breath, abdominal pain, fever/chills.  Patient crying during conversation as to possible surgery with cardiothoracic surgeons, reported her dad died during surgery.  Patient reassured.  Assessment/Plan: Principal Problem:   MSSA bacteremia Active Problems:   Sepsis affecting skin (HCC)   Pelvic pain   Cellulitis of left hand   Staphylococcus aureus bacteremia with sepsis (HCC)   Weakness of both lower extremities   Pain of right clavicle   IVDU (intravenous drug user)   Septic arthritis of right sternoclavicular joint (HCC)  Staphylococcus aureus bacteremia, complicated with sepsis Afebrile with leukocytosis BC positive with staph aureus, repeat shows no growth in 24 hours Started IV Nafcillin MRI brain with contrast pending ID on board  Tricuspid valve endocarditis with mod-severe TR Pt had TEE on 2/7/9 which showed tricuspid valve endocarditis with ?perforated leaflet, with mod-severe TR. A large PFO was also noted on bubble study ID on board Called consult to CTS Dr Tyrone Sage, will see pt  Right sternoclavicular joint septic arthritis CT chest showed above after pt c/o R shoulder pain Consult to CTS as above  Acute on chronic anemia of iron deficiency Hgb 6.7, transfused 1U of PRBC on 02/20/17 Iron panel  with iron 14, sats 9% Will transfuse if hgb <7 Continue iron supplements and prophylactic bowel regimen  Urinary tract infection Completed antibiotic therapy  Hyperkalemia Resolved Daily BMET  Thrombocytopenia Resolved  IV drug abuse No signs of withdrawal, will continue neuro checks per unit protocol As needed alprazolam, continue suboxone.   Depression Continue cymbalta and olanzapine   Code Status: Full  Family Communication: Spoke to mother and step-dad  Disposition Plan: Home   Consultants:  ID  CIR  CTS  Procedures:  None  Antimicrobials:  IV Nafcillin   DVT prophylaxis:  Lovenox   Objective: Vitals:   02/20/17 1512 02/20/17 2156 02/21/17 0553 02/21/17 1330  BP: (!) 98/57 (!) 93/53 (!) 85/51 (!) 103/59  Pulse:  90 79 86  Resp: 18  18 18   Temp: 98.3 F (36.8 C) 99 F (37.2 C) 98.2 F (36.8 C) 98 F (36.7 C)  TempSrc: Oral Oral Oral Oral  SpO2: 94% 98% 94% 96%  Weight:      Height:        Intake/Output Summary (Last 24 hours) at 02/21/2017 1927 Last data filed at 02/21/2017 1500 Gross per 24 hour  Intake 300 ml  Output -  Net 300 ml   Filed Weights   02/13/17 2120 02/17/17 1500  Weight: 76.5 kg (168 lb 10.4 oz) 80.7 kg (177 lb 14.6 oz)    Exam:   General:  Alert, awake, oriented, deconditioned  Cardiovascular: S1-S2 present, no added heart sounds  Respiratory: Chest clear bilaterally  Abdomen: Soft, nontender, nondistended, bowel sounds present  Musculoskeletal: No pedal edema bilaterally  Skin: Improved rash on the left hand  Psychiatry: Tearful   Data Reviewed:  CBC: Recent Labs  Lab 02/17/17 0714 02/18/17 0503 02/19/17 0352 02/20/17 0408 02/20/17 1305 02/21/17 0652  WBC 14.4* 14.5* 13.2* 11.8*  --  8.8  NEUTROABS  --   --  8.9* 7.9*  --  5.8  HGB 7.4* 7.0* 7.1* 6.7* 8.2* 7.5*  HCT 20.8* 20.3* 21.4* 19.7* 24.0* 22.9*  MCV 83.9 83.5 84.3 85.7  --  87.4  PLT 174 281 340 417*  --  387   Basic Metabolic  Panel: Recent Labs  Lab 02/15/17 0247  02/17/17 0714 02/18/17 0503 02/19/17 0352 02/19/17 0921 02/20/17 0408 02/20/17 1305 02/21/17 0652  NA 133*   < > 134* 135 135  --  133* 136 133*  K 3.1*   < > 5.0 5.0 6.3* 5.2* 4.4 4.5 4.3  CL 98*   < > 100* 100* 102  --  99*  --  99*  CO2 24   < > 20* 22 18*  --  21*  --  21*  GLUCOSE 126*   < > 121* 79 74  --  87 83 89  BUN 8   < > 9 10 13   --  13  --  14  CREATININE 0.88   < > 0.96 1.06* 1.00  --  1.03*  --  0.83  CALCIUM 8.0*   < > 8.0* 8.2* 8.7*  --  8.5*  --  8.9  MG 2.0  --   --   --   --   --   --   --   --    < > = values in this interval not displayed.   GFR: Estimated Creatinine Clearance: 106.3 mL/min (by C-G formula based on SCr of 0.83 mg/dL). Liver Function Tests: Recent Labs  Lab 02/15/17 0247 02/16/17 0252 02/18/17 0503  AST 47* 49* 50*  ALT 27 22 22   ALKPHOS 153* 186* 173*  BILITOT 2.0* 2.1* 1.4*  PROT 5.3* 5.1* 6.1*  ALBUMIN 1.6* 1.5* 1.6*   No results for input(s): LIPASE, AMYLASE in the last 168 hours. No results for input(s): AMMONIA in the last 168 hours. Coagulation Profile: No results for input(s): INR, PROTIME in the last 168 hours. Cardiac Enzymes: No results for input(s): CKTOTAL, CKMB, CKMBINDEX, TROPONINI in the last 168 hours. BNP (last 3 results) No results for input(s): PROBNP in the last 8760 hours. HbA1C: No results for input(s): HGBA1C in the last 72 hours. CBG: No results for input(s): GLUCAP in the last 168 hours. Lipid Profile: No results for input(s): CHOL, HDL, LDLCALC, TRIG, CHOLHDL, LDLDIRECT in the last 72 hours. Thyroid Function Tests: No results for input(s): TSH, T4TOTAL, FREET4, T3FREE, THYROIDAB in the last 72 hours. Anemia Panel: No results for input(s): VITAMINB12, FOLATE, FERRITIN, TIBC, IRON, RETICCTPCT in the last 72 hours. Urine analysis:    Component Value Date/Time   COLORURINE AMBER (A) 02/13/2017 0436   APPEARANCEUR HAZY (A) 02/13/2017 0436   LABSPEC 1.014  02/13/2017 0436   PHURINE 5.0 02/13/2017 0436   GLUCOSEU NEGATIVE 02/13/2017 0436   HGBUR LARGE (A) 02/13/2017 0436   BILIRUBINUR NEGATIVE 02/13/2017 0436   KETONESUR NEGATIVE 02/13/2017 0436   PROTEINUR NEGATIVE 02/13/2017 0436   UROBILINOGEN 0.2 12/07/2012 1510   NITRITE POSITIVE (A) 02/13/2017 0436   LEUKOCYTESUR TRACE (A) 02/13/2017 0436   Sepsis Labs: @LABRCNTIP (procalcitonin:4,lacticidven:4)  ) Recent Results (from the past 240 hour(s))  Culture, blood (Routine x 2)     Status: Abnormal   Collection Time: 02/13/17  4:00 PM  Result Value Ref  Range Status   Specimen Description BLOOD RIGHT ANTECUBITAL  Final   Special Requests IN PEDIATRIC BOTTLE Blood Culture adequate volume  Final   Culture  Setup Time   Final    GRAM POSITIVE COCCI IN PEDIATRIC BOTTLE CRITICAL RESULT CALLED TO, READ BACK BY AND VERIFIED WITHMelven Sartorius [facility] [id number] 02/14/17 A BROWNING Performed at [facility], [address] [place], [place] [address]    Culture STAPHYLOCOCCUS AUREUS (A)  Final   Report Status 02/17/2017 FINAL  Final   Organism ID, Bacteria STAPHYLOCOCCUS AUREUS  Final      Susceptibility   Staphylococcus aureus - MIC*    CIPROFLOXACIN <=0.5 SENSITIVE Sensitive     ERYTHROMYCIN <=0.25 SENSITIVE Sensitive     GENTAMICIN <=0.5 SENSITIVE Sensitive     OXACILLIN <=0.25 SENSITIVE Sensitive     TETRACYCLINE >=16 RESISTANT Resistant     VANCOMYCIN 1 SENSITIVE Sensitive     TRIMETH/SULFA <=10 SENSITIVE Sensitive     CLINDAMYCIN <=0.25 SENSITIVE Sensitive     RIFAMPIN <=0.5 SENSITIVE Sensitive     Inducible Clindamycin NEGATIVE Sensitive     * STAPHYLOCOCCUS AUREUS  Blood Culture ID Panel (Reflexed)     Status: Abnormal   Collection Time: 02/13/17  4:00 PM  Result Value Ref Range Status   Enterococcus species NOT DETECTED NOT DETECTED Final   Listeria monocytogenes NOT DETECTED NOT DETECTED Final   Staphylococcus species DETECTED (A) NOT DETECTED Final    Comment: CRITICAL RESULT  CALLED TO, READ BACK BY AND VERIFIED WITH: [facility] PHARMD [id number] 02/14/17 A BROWNING    Staphylococcus aureus DETECTED (A) NOT DETECTED Final    Comment: Methicillin (oxacillin) susceptible Staphylococcus aureus (MSSA). Preferred therapy is anti staphylococcal beta lactam antibiotic (Cefazolin or Nafcillin), unless clinically contraindicated. CRITICAL RESULT CALLED TO, READ BACK BY AND VERIFIED WITH: Melven Sartorius PHARMD [id number] 02/14/17 A BROWNING    Methicillin resistance NOT DETECTED NOT DETECTED Final   Streptococcus species NOT DETECTED NOT DETECTED Final   Streptococcus agalactiae NOT DETECTED NOT DETECTED Final   Streptococcus pneumoniae NOT DETECTED NOT DETECTED Final   Streptococcus pyogenes NOT DETECTED NOT DETECTED Final   Acinetobacter baumannii NOT DETECTED NOT DETECTED Final   Enterobacteriaceae species NOT DETECTED NOT DETECTED Final   Enterobacter cloacae complex NOT DETECTED NOT DETECTED Final   Escherichia coli NOT DETECTED NOT DETECTED Final   Klebsiella oxytoca NOT DETECTED NOT DETECTED Final   Klebsiella pneumoniae NOT DETECTED NOT DETECTED Final   Proteus species NOT DETECTED NOT DETECTED Final   Serratia marcescens NOT DETECTED NOT DETECTED Final   Haemophilus influenzae NOT DETECTED NOT DETECTED Final   Neisseria meningitidis NOT DETECTED NOT DETECTED Final   Pseudomonas aeruginosa NOT DETECTED NOT DETECTED Final   Candida albicans NOT DETECTED NOT DETECTED Final   Candida glabrata NOT DETECTED NOT DETECTED Final   Candida krusei NOT DETECTED NOT DETECTED Final   Candida parapsilosis NOT DETECTED NOT DETECTED Final   Candida tropicalis NOT DETECTED NOT DETECTED Final    Comment: Performed at [facility], [address], [place], [place] [address]  Culture, blood (Routine x 2)     Status: Abnormal   Collection Time: 02/13/17  5:00 PM  Result Value Ref Range Status   Specimen Description BLOOD RIGHT ANTECUBITAL  Final   Special Requests   Final    BOTTLES DRAWN  AEROBIC AND ANAEROBIC Blood Culture adequate volume   Culture  Setup Time   Final    GRAM POSITIVE COCCI  IN CLUSTERS IN BOTH AEROBIC AND ANAEROBIC BOTTLES CRITICAL VALUE NOTED.  VALUE IS CONSISTENT WITH PREVIOUSLY REPORTED AND CALLED VALUE.    Culture (A)  Final    STAPHYLOCOCCUS AUREUS SUSCEPTIBILITIES PERFORMED ON PREVIOUS CULTURE WITHIN THE LAST 5 DAYS. Performed at [facility], [address] [place], [place] [address]    Report Status 02/16/2017 FINAL  Final  MRSA PCR Screening     Status: None   Collection Time: 02/13/17  9:32 PM  Result Value Ref Range Status   MRSA by PCR NEGATIVE NEGATIVE Final    Comment:        The GeneXpert MRSA Assay (FDA approved for NASAL specimens only), is one component of a comprehensive MRSA colonization surveillance program. It is not intended to diagnose MRSA infection nor to guide or monitor treatment for MRSA infections.   Culture, blood (Routine X 2) w Reflex to ID Panel     Status: Abnormal   Collection Time: 02/14/17 10:07 AM  Result Value Ref Range Status   Specimen Description BLOOD RIGHT HAND  Final   Special Requests IN PEDIATRIC BOTTLE Blood Culture adequate volume  Final   Culture  Setup Time   Final    GRAM POSITIVE COCCI IN PEDIATRIC BOTTLE CRITICAL VALUE NOTED.  VALUE IS CONSISTENT WITH PREVIOUSLY REPORTED AND CALLED VALUE.    Culture (A)  Final    STAPHYLOCOCCUS AUREUS SUSCEPTIBILITIES PERFORMED ON PREVIOUS CULTURE WITHIN THE LAST 5 DAYS. Performed at [facility], [address] [place], [place] [address]    Report Status 02/17/2017 FINAL  Final  Culture, blood (Routine X 2) w Reflex to ID Panel     Status: Abnormal   Collection Time: 02/17/17 10:10 AM  Result Value Ref Range Status   Specimen Description BLOOD RIGHT HAND  Final   Special Requests IN PEDIATRIC BOTTLE Blood Culture adequate volume  Final   Culture  Setup Time   Final    GRAM POSITIVE COCCI IN PEDIATRIC BOTTLE CRITICAL VALUE NOTED.   VALUE IS CONSISTENT WITH PREVIOUSLY REPORTED AND CALLED VALUE.    Culture (A)  Final    STAPHYLOCOCCUS AUREUS SUSCEPTIBILITIES PERFORMED ON PREVIOUS CULTURE WITHIN THE LAST 5 DAYS. Performed at [facility], [address] [place], [place] [address]    Report Status 02/19/2017 FINAL  Final  Culture, blood (Routine X 2) w Reflex to ID Panel     Status: None (Preliminary result)   Collection Time: 02/17/17 10:15 AM  Result Value Ref Range Status   Specimen Description BLOOD LEFT ANTECUBITAL  Final   Special Requests IN PEDIATRIC BOTTLE Blood Culture adequate volume  Final   Culture   Final    NO GROWTH 4 DAYS Performed at [facility], [address] [place], [place] [address]    Report Status PENDING  Incomplete  Culture, blood (Routine X 2) w Reflex to ID Panel     Status: None (Preliminary result)   Collection Time: 02/19/17  9:15 AM  Result Value Ref Range Status   Specimen Description BLOOD LEFT ANTECUBITAL  Final   Special Requests   Final    BOTTLES DRAWN AEROBIC AND ANAEROBIC Blood Culture adequate volume   Culture   Final    NO GROWTH 2 DAYS Performed at [facility], [address] [place], [place] [address]    Report Status PENDING  Incomplete  Culture, blood (Routine X 2) w Reflex to ID Panel     Status:  None (Preliminary result)   Collection Time: 02/19/17  9:30 AM  Result Value Ref Range Status   Specimen Description BLOOD RIGHT ANTECUBITAL  Final   Special Requests   Final    BOTTLES DRAWN AEROBIC AND ANAEROBIC Blood Culture adequate volume   Culture   Final    NO GROWTH 2 DAYS Performed at [facility], [address], [place], [place] [address]    Report Status PENDING  Incomplete      Studies: No results found.  Scheduled Meds: . buprenorphine-naloxone  2 tablet Sublingual Daily  . DULoxetine  60 mg Oral BID  . enoxaparin (LOVENOX) injection  40 mg Subcutaneous Q24H  . feeding supplement (ENSURE ENLIVE)  237 mL Oral BID BM    . ferrous sulfate  325 mg Oral TID WC  . multivitamin with minerals  1 tablet Oral Daily  . OLANZapine  5 mg Oral q morning - 10a  . polyethylene glycol  17 g Oral Daily  . senna-docusate  1 tablet Oral BID    Continuous Infusions: . nafcillin IV       LOS: 8 days     Briant Cedar, MD [facility]  If 7PM-7AM, please contact night-coverage [email or web address] Password [email or web address] 02/21/2017, 7:27 PM

## 2017-02-21 NOTE — Progress Notes (Addendum)
Patient ID: [name], female   DOB: [birth year], 32 y.o.   MRN: [id number]      [address]       [place] [address]             [phone]        [name] [facility] Medical Record #[id number] Date of Birth: 14-Sep-1985  Referring: No ref. provider found Primary Care: Tammy Grippe, MD Primary Cardiologist:No primary care provider on file.  Chief Complaint:    Chief Complaint  Patient presents with  . 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 patient denies shortness of breath Patient admitted 1 week ago with sepsis.  Current Activity/ Functional Status: Patient is independent with mobility/ambulation, transfers, ADL's, IADL's.   Zubrod Score: At the time of surgery this patient's most appropriate activity status/level should be described as: []     0    Normal activity, no symptoms [x]     1    Restricted in physical strenuous activity but ambulatory, able to do out light work []     2    Ambulatory and capable of self care, unable to do work activities, up and about                 more than 50%  Of the time                            []     3    Only limited self care, in bed greater than 50% of waking hours []     4    Completely disabled, no self care, confined to bed or chair []     5    Moribund  Past Medical History:  Diagnosis Date  . Abdominal pain, unspecified site   . Anemia, unspecified   . Anxiety   . Anxiety state, unspecified   . Asthma   . Disc herniation    causes sciatica unsure which disc  . Esophageal reflux   . Fatty liver   . Fibromyalgia   . Gastroparesis   . Headache(784.0)   . Heart murmur   . History of narcotic addiction (HCC) 09/30/2012   2010:  Per pt, was addicted to narcotics; mom helped intervene and stopped taking opiods   . Hyperemesis gravidarum with metabolic disturbance, unspecified as to episode of care   .  Irritable bowel syndrome   . Irritable bowel syndrome   . MSSA bacteremia 02/20/2017  . Nausea alone   . Other and unspecified noninfectious gastroenteritis and colitis(558.9)   . Other dysphagia   . Pregnant   . PTSD (post-traumatic stress disorder)   . Septic arthritis of right sternoclavicular joint (HCC) 02/20/2017  . Unspecified asthma(493.90)   . Unspecified constipation     Past Surgical History:  Procedure Laterality Date  . APPENDECTOMY    . CHOLECYSTECTOMY    . COLONOSCOPY    . LAPAROSCOPIC BILATERAL SALPINGECTOMY Bilateral 12/08/2012   Procedure: LAPAROSCOPIC BILATERAL SALPINGECTOMY;  Surgeon: Tilda Burrow, MD;  Location: [facility];  Service: Gynecology;  Laterality: Bilateral;  . MULTIPLE EXTRACTIONS WITH ALVEOLOPLASTY N/A 12/01/2015   Procedure: EXTRACTION TEETH TWO, THREE, FOUR, SIX, SEVEN, EIGHT, NINE, TEN, ELEVEN, TWELVE, FOURTEEN, FIFTEEN, TWENTY, TWENTY ONE, TWENTY EIGHT, TWENTY NINE, THIRTY AND THIRTY ONE WITH ALVEOLOPLASTY;  Surgeon: Ocie Doyne, DDS;  Location: [facility] OR;  Service: Oral Surgery;  Laterality: N/A;  . TEE WITHOUT CARDIOVERSION N/A 02/20/2017   Procedure: TRANSESOPHAGEAL ECHOCARDIOGRAM (TEE);  Surgeon: Laurey Morale, MD;  Location: [facility] ENDOSCOPY;  Service: Cardiovascular;  Laterality: N/A;  . TUBAL LIGATION      Social History   Tobacco Use  Smoking Status Current Every Day Smoker  . Packs/day: 2.00  . Years: 10.00  . Pack years: 20.00  . Types: Cigarettes  Smokeless Tobacco Never Used    Social History   Substance and Sexual Activity  Alcohol Use No    Social History   Socioeconomic History  . Marital status: Single    Spouse name: Not on file  . Number of children: 2  . Years of education: Not on file  . Highest education level: Not on file  Social Needs  . Financial resource strain: Not on file  . Food insecurity - worry: Not on file  . Food insecurity - inability: Not on file  . Transportation needs - medical: Not on file  .  Transportation needs - non-medical: Not on file  Occupational History  . Occupation: CNA  Tobacco Use  . Smoking status: Current Every Day Smoker    Packs/day: 2.00    Years: 10.00    Pack years: 20.00    Types: Cigarettes  . Smokeless tobacco: Never Used  Substance and Sexual Activity  . Alcohol use: No  . Drug use: No    Comment: pt denies 11/30/15  . Sexual activity: No    Birth control/protection: Surgical  Other Topics Concern  . Not on file  Social History Narrative   Daily caffeine     Allergies  Allergen Reactions  . Azithromycin Nausea And Vomiting    ABDOMINAL PAIN  . Nsaids     UNSPECIFIED REACTION    . Hydrocodone Nausea And Vomiting  . Penicillins Nausea And Vomiting     Has patient had a PCN reaction causing immediate rash, facial/tongue/throat swelling, SOB or lightheadedness with hypotension: No Has patient had a PCN reaction causing severe rash involving mucus membranes or skin necrosis: No Has patient had a PCN reaction that required hospitalization No Has patient had a PCN reaction occurring within the last 10 years: No If all of the above answers are "NO", then may proceed with Cephalosporin use.   . Prednisone Nausea Only and Other (See Comments)    Can take shot, but pill form "caused stomach pain , nausea"    Current Facility-Administered Medications  Medication Dose Route Frequency Provider Last Rate Last Dose  . acetaminophen (TYLENOL) tablet 650 mg  650 mg Oral Q6H PRN Tammy Blood, MD   650 mg at 02/21/17 0435  . ALPRAZolam Prudy Feeler) tablet 0.5 mg  0.5 mg Oral QID PRN Tammy Blood, MD   0.5 mg at 02/21/17 1716  . buprenorphine-naloxone (SUBOXONE) 2-0.5 mg per SL tablet 2 tablet  2 tablet Sublingual Daily Tammy Blood, MD   2 tablet at 02/21/17 [phone]  . cyclobenzaprine (FLEXERIL) tablet 10 mg  10 mg Oral TID PRN Tammy Blood, MD   10 mg at 02/21/17 1322  . DULoxetine (CYMBALTA) DR capsule 60 mg  60 mg Oral BID Tammy Curie,  MD   60 mg at 02/21/17 0924  . enoxaparin (LOVENOX) injection 40 mg  40 mg Subcutaneous Q24H Tammy Curie, MD   40 mg at 02/20/17 2254  . feeding supplement (ENSURE ENLIVE) (ENSURE ENLIVE) liquid 237 mL  237 mL  Oral BID BM Briant Cedar, MD      . ferrous sulfate tablet 325 mg  325 mg Oral TID WC Arrien, York Ram, MD   325 mg at 02/21/17 1705  . multivitamin with minerals tablet 1 tablet  1 tablet Oral Daily Tammy Curie, MD   1 tablet at 02/21/17 0925  . nafcillin 2 g in dextrose 5 % 100 mL IVPB  2 g Intravenous Q4H Briant Cedar, MD      . OLANZapine (ZYPREXA) tablet 5 mg  5 mg Oral q morning - 10a Tammy Blood, MD   5 mg at 02/21/17 1610  . ondansetron (ZOFRAN) tablet 4 mg  4 mg Oral Q6H PRN Tammy Curie, MD       Or  . ondansetron (ZOFRAN) injection 4 mg  4 mg Intravenous Q6H PRN Tammy Curie, MD      . polyethylene glycol (MIRALAX / GLYCOLAX) packet 17 g  17 g Oral Daily Tammy Blood, MD   17 g at 02/18/17 1012  . senna-docusate (Senokot-S) tablet 1 tablet  1 tablet Oral BID Tammy Blood, MD   1 tablet at 02/21/17 9604    Medications Prior to Admission  Medication Sig Dispense Refill Last Dose  . acetaminophen (TYLENOL) 500 MG tablet Take 1,000 mg by mouth every 6 (six) hours as needed for mild pain or moderate pain.   02/11/2017 at prn  . ALPRAZolam (XANAX) 0.5 MG tablet Take 1 tablet (0.5 mg total) by mouth 3 (three) times daily as needed for anxiety. (Patient taking differently: Take 0.5 mg by mouth 4 (four) times daily. ) 21 tablet 0 02/13/2017 at Unknown time  . Buprenorphine HCl-Naloxone HCl (SUBOXONE) 4-1 MG FILM Place 2 Film under the tongue daily.    Past Week at Unknown time  . DULoxetine (CYMBALTA) 60 MG capsule Take 60 mg by mouth 2 (two) times daily.   02/13/2017 at Unknown time  . Multiple Vitamin (MULTIVITAMIN WITH MINERALS) TABS tablet Take 1 tablet by mouth daily. ALL NATURAL VITAMIN: Made by Romie Jumper (system six)   02/13/2017 at Unknown time    . OLANZapine (ZYPREXA) 5 MG tablet Take 1 tablet by mouth every morning.   02/12/2017  . ondansetron (ZOFRAN) 4 MG tablet Take 1 tablet (4 mg total) by mouth every 6 (six) hours. For nausea or vomiting 12 tablet 0 02/11/2017 at prn  . cyclobenzaprine (FLEXERIL) 10 MG tablet Take 1 tablet (10 mg total) by mouth 3 (three) times daily. 12 tablet 0     Family History  Problem Relation Age of Onset  . Ulcerative colitis Paternal Grandmother   . Cancer Paternal Grandmother   . Colon polyps Paternal Grandfather   . Cancer Paternal Grandfather        thyroid  . Diabetes Father   . Heart disease Father   . Kidney disease Father   . Hypertension Father      Review of Systems:   Review of Systems  Constitutional: Positive for chills, fever, malaise/fatigue and weight loss.  HENT: Negative.   Eyes: Negative.   Respiratory: Negative.   Cardiovascular: Positive for palpitations and leg swelling.  Gastrointestinal: Negative.   Genitourinary: Negative.   Musculoskeletal: Negative.   Skin: Positive for rash.  Neurological: Positive for weakness.  Endo/Heme/Allergies: Negative.   Psychiatric/Behavioral: Positive for substance abuse.   Pertinent items are noted in HPI.      Physical Exam: BP (!) 103/59 (BP Location: Right Arm)   Pulse 86  Temp 98 F (36.7 C) (Oral)   Resp 18   Ht 5\' 7"  (1.702 m)   Wt 177 lb 14.6 oz (80.7 kg)   LMP 01/28/2017 (Exact Date)   SpO2 96%   BMI 27.86 kg/m    General appearance: alert, cooperative, appears older than stated age and no distress Head: Normocephalic, without obvious abnormality, atraumatic Neck: no adenopathy, no carotid bruit, no JVD, supple, symmetrical, trachea midline and thyroid not enlarged, symmetric, no tenderness/mass/nodules Lymph nodes: Cervical, supraclavicular, and axillary nodes normal. Resp: diminished breath sounds bibasilar Back: symmetric, no curvature. ROM normal. No CVA tenderness. Cardio: regular rate and rhythm, S1,  S2 normal, no murmur, click, rub or gallop GI: soft, non-tender; bowel sounds normal; no masses,  no organomegaly Extremities: Homans sign is negative, no sign of DVT patient notes that she had had infections in her hands and feet on admission but says these have gotten better. Neurologic: Grossly normal Sternum including sternoclavicular joint and clavicular without redness , swelling or tenderness   Diagnostic Studies & Laboratory data:     Recent Radiology Findings:   Ct Chest W Contrast  Result Date: 02/20/2017 CLINICAL DATA:  Acute onset of generalized chest pain. Assess for sternoclavicular septic arthritis. EXAM: CT CHEST WITH CONTRAST TECHNIQUE: Multidetector CT imaging of the chest was performed during intravenous contrast administration. CONTRAST:  75 mL ISOVUE-300 IOPAMIDOL (ISOVUE-300) INJECTION 61% COMPARISON:  MRI of the thoracic spine performed 02/17/2017 FINDINGS: Cardiovascular: The heart is borderline normal in size. The thoracic aorta is grossly unremarkable. A retroesophageal right subclavian artery is incidentally noted. The great vessels are unremarkable in appearance. Mediastinum/Nodes: Enlarged mediastinal nodes are noted at the subcarinal and azygoesophageal regions, aortopulmonary window, right paratracheal region and periaortic region, measuring up to 1.4 cm in short axis. Mildly prominent bilateral hilar nodes measure up to the 1.2 cm in short axis. No pericardial effusion is identified. The visualized portions of the thyroid gland are unremarkable. No axillary lymphadenopathy is appreciated. Lungs/Pleura: There is patchy airspace opacification involving the lower lobes bilaterally. Scattered nodular opacities are seen throughout both lungs. A small cavitary nodule is suggested at the right lung apex. This may reflect diffuse infection, possibly atypical in nature. Underlying metastatic disease cannot be excluded. Trace bilateral pleural fluid is noted.  No pneumothorax is seen.  Upper Abdomen: The visualized portions of the liver are unremarkable. The spleen is enlarged, measuring 14.6 cm in length. The patient is status post cholecystectomy, with clips noted at the gallbladder fossa. The visualized portions of the pancreas and adrenal glands are within normal limits. Musculoskeletal: There is asymmetric prominence of soft tissue density tracking about the right sternoclavicular joint, with mild underlying soft tissue inflammation. Given clinical concern, this could reflect right sternoclavicular joint septic arthritis. IMPRESSION: 1. Patchy airspace opacification involving the lower lung lobes bilaterally. Scattered nodular opacities throughout both lungs. Small cavitary nodule suggested at the right lung apex. Findings are concerning for diffuse infection, possibly atypical in nature. Underlying metastatic disease cannot be excluded. Would correlate clinically, and perform follow-up CT of the chest 3-4 weeks after completion of treatment for pneumonia. 2. Trace bilateral pleural fluid noted. 3. Asymmetric prominence of soft tissue density tracking about the right sternoclavicular joint, with mild underlying soft tissue inflammation. Given clinical concern, this could reflect right-sided clavicular joint septic arthritis. 4. Diffusely prominent mediastinal and bilateral hilar nodes noted. This may reflect the underlying infection. 5. Retroesophageal right subclavian artery incidentally noted. Electronically Signed   By: Beryle Beams.D.  On: 02/20/2017 01:40     I have independently reviewed the above radiologic studies.  Recent Lab Findings: Lab Results  Component Value Date   WBC 8.8 02/21/2017   HGB 7.5 (L) 02/21/2017   HCT 22.9 (L) 02/21/2017   PLT 387 02/21/2017   GLUCOSE 89 02/21/2017   ALT 22 02/18/2017   AST 50 (H) 02/18/2017   NA 133 (L) 02/21/2017   K 4.3 02/21/2017   CL 99 (L) 02/21/2017   CREATININE 0.83 02/21/2017   BUN 14 02/21/2017   CO2 21 (L)  02/21/2017   TSH 1.740 02/03/2009   INR 1.08 02/13/2017   HGBA1C 5.6 02/03/2009   Procedure: TEE  Sedation: Per anesthesiology  Indication: MSSA bacteremia, assess for endocarditis  Findings: Please see echo section for full report.  Normal left ventricular size and systolic function, EF 60-65%.  Normal wall motion.  Normal wall thickness.  The right ventricle was mildly dilated with normal systolic function.  Normal left atrium with no LA appendage thrombus.  Mildly dilated right atrium.  There was a large PFO present, positive bubble study.  Normal mitral valve, no regurgitation or stenosis, no vegetation.  Normal trileaflet aortic valve, no regurgitation or stenosis, no vegetation.  Normal pulmonary valve without endocarditis.  The tricuspid valve had a 1.8 x 1.7 cm vegetation.  There was moderate to severe tricuspid regurgitation, cannot rule out perforated leaflet.    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  There was a large PFO, putting the patient at risk for spread of infection from venous circulation to arterial circulation and putting the mitral and aortic valves at higher risk.     Assessment / Plan:   1/Tricuspid endocarditis-without congestive heart failure symptoms at this point, patient does have a patent foramen of ovale-  2/Would not recommend tricuspid valve replacement at this point.  Tricuspid valve replacement in the setting of IV drug use would result in poor outcome   Will need close follow-up for development of congestive heart failure, question cardiology consult   Generally accepted indications for surgical treatment of endocarditis:  Valve abnormalities or regurgitation resulting in congestive heart failure Microorganisms that are not controlled by antimicrobial therapy (fungal) Endocarditis leading to valve dehiscence, perforation, rupture or fistula or large perivalvular abscess, recurrent  emboli Persistent vegetation or fever/bacteremia despite optimal treatment vegetations  that are mobile and larger then>10 mm in diameter on the mitral valve vegetations that are increasing in size despite antimicrobial therapy Mitral "kissing" vegetation   Tammy OvensEdward B Aishi Courts MD      [address] [place] [id number] Office [phone]   Beeper [phone]  02/21/2017 7:48 PM

## 2017-02-21 NOTE — Anesthesia Postprocedure Evaluation (Signed)
Anesthesia Post Note  Patient: [name]  Procedure(s) Performed: TRANSESOPHAGEAL ECHOCARDIOGRAM (TEE) (N/A )     Patient location during evaluation: Endoscopy Anesthesia Type: General Level of consciousness: awake Pain management: pain level controlled Vital Signs Assessment: post-procedure vital signs reviewed and stable Respiratory status: spontaneous breathing Cardiovascular status: stable Postop Assessment: no apparent nausea or vomiting Anesthetic complications: no    Last Vitals:  Vitals:   02/20/17 2156 02/21/17 0553  BP: (!) 93/53 (!) 85/51  Pulse: 90 79  Resp:  18  Temp: 37.2 C 36.8 C  SpO2: 98% 94%    Last Pain:  Vitals:   02/21/17 0553  TempSrc: Oral  PainSc:    Pain Goal: Patients Stated Pain Goal: 3 (02/21/17 0435)               [name],[name]

## 2017-02-21 NOTE — Progress Notes (Signed)
Occupational Therapy Treatment Patient Details Name: Tammy ArabCasey M Plasencia MRN: [id number] DOB: 10/03/1985 Today's Date: 02/21/2017    History of present illness Pt is a 32 y.o. female, with past medical history significant for fibromyalgia, anemia and history of IV drug abuse. She presented to the ED with a few days history of fever, chills, and generalized weakness muscle aches in addition to arthralgias.  She was admitted with diagnosis of sepsis.    OT comments  Pt progressing towards acute OT goals. Focus of session was activity tolerance and balance during ADL routine (LB bathing/dressing, toilet transfer, pericare, standing at sink for 1 grooming tasks. Discussed energy conservation strategies. D/c plan updated to HHOT.   Follow Up Recommendations  Home health OT;Supervision/Assistance - 24 hour    Equipment Recommendations  3 in 1 bedside commode    Recommendations for Other Services      Precautions / Restrictions Precautions Precautions: Fall Restrictions Weight Bearing Restrictions: No       Mobility Bed Mobility               General bed mobility comments: OOB  Transfers Overall transfer level: Needs assistance Equipment used: Rolling walker (2 wheeled) Transfers: Sit to/from UGI CorporationStand;Stand Pivot Transfers Sit to Stand: Min assist Stand pivot transfers: Min assist       General transfer comment: min a to steady. exrtra time and effort    Balance Overall balance assessment: Needs assistance Sitting-balance support: No upper extremity supported;Feet supported Sitting balance-Leahy Scale: Good     Standing balance support: Bilateral upper extremity supported;During functional activity Standing balance-Leahy Scale: Fair Standing balance comment: heavy reliance on RW                           ADL either performed or assessed with clinical judgement   ADL Overall ADL's : Needs assistance/impaired     Grooming: Oral care;Min guard;Standing    Upper Body Bathing: Set up;Sitting   Lower Body Bathing: Min guard;Sit to/from stand Lower Body Bathing Details (indicate cue type and reason): min guard for safety. Pt reports occasional spasm in legs     Lower Body Dressing: Sit to/from stand;Min guard;Minimal assistance Lower Body Dressing Details (indicate cue type and reason): mostly min guard, light assist for balance during tasks in standing.  Toilet Transfer: Min guard;Ambulation;RW(3n1 over toilet)   Toileting- Clothing Manipulation and Hygiene: Set up;Min guard;Sitting/lateral lean;Sit to/from stand       Functional mobility during ADLs: Min guard;Minimal assistance;Rolling walker General ADL Comments: Pt received sitting at sink with mom present getting set up for sponge bath. Pt completed LB bathing/dressing, toilet transfer, pericare, and oral care as detailed above. Mostly min guard for activities requiring dynamic balance with occassional light min A to steady balance. Fair toleration of ADL tasks this session. discussed some basic energy conservation education.      Vision   Additional Comments: wears contacts, dentures   Perception     Praxis      Cognition Arousal/Alertness: Awake/alert Behavior During Therapy: WFL for tasks assessed/performed Overall Cognitive Status: Within Functional Limits for tasks assessed                                          Exercises     Shoulder Instructions       General Comments Mother present and involved during session.  Pertinent Vitals/ Pain       Pain Assessment: Faces Faces Pain Scale: Hurts little more Pain Location: groin > BLE Pain Descriptors / Indicators: Sore;Guarding;Grimacing Pain Intervention(s): Limited activity within patient's tolerance;Monitored during session;Repositioned  Home Living                                          Prior Functioning/Environment              Frequency  Min 3X/week         Progress Toward Goals  OT Goals(current goals can now be found in the care plan section)  Progress towards OT goals: Progressing toward goals  Acute Rehab OT Goals Patient Stated Goal: get better OT Goal Formulation: With patient Time For Goal Achievement: 03/03/17 Potential to Achieve Goals: Good ADL Goals Pt Will Perform Lower Body Bathing: with modified independence;sit to/from stand Pt Will Perform Lower Body Dressing: with modified independence;sit to/from stand Pt Will Transfer to Toilet: with modified independence;ambulating Pt Will Perform Toileting - Clothing Manipulation and hygiene: with modified independence Pt Will Perform Tub/Shower Transfer: with modified independence;ambulating  Plan Discharge plan remains appropriate    Co-evaluation                 AM-PAC PT "6 Clicks" Daily Activity     Outcome Measure   Help from another person eating meals?: None Help from another person taking care of personal grooming?: A Little Help from another person toileting, which includes using toliet, bedpan, or urinal?: A Little Help from another person bathing (including washing, rinsing, drying)?: A Little Help from another person to put on and taking off regular upper body clothing?: A Little Help from another person to put on and taking off regular lower body clothing?: A Lot 6 Click Score: 18    End of Session Equipment Utilized During Treatment: Rolling walker  OT Visit Diagnosis: Other abnormalities of gait and mobility (R26.89);Muscle weakness (generalized) (M62.81);Pain   Activity Tolerance Patient tolerated treatment well   Patient Left in chair;with call bell/phone within reach;with family/visitor present   Nurse Communication          Time: 7829-5621 OT Time Calculation (min): 20 min  Charges: OT General Charges $OT Visit: 1 Visit OT Treatments $Self Care/Home Management : 8-22 mins     Pilar Grammes 02/21/2017, 1:25 PM

## 2017-02-21 NOTE — Progress Notes (Signed)
Subjective:  Patient is very anxious and  tearful   Antibiotics:  Anti-infectives (From admission, onward)   Start     Dose/Rate Route Frequency Ordered Stop   02/21/17 2000  nafcillin 2 g in dextrose 5 % 100 mL IVPB     2 g 200 mL/hr over 30 Minutes Intravenous Every 4 hours 02/21/17 1741     02/21/17 1730  nafcillin injection 2 g  Status:  Discontinued     2 g Intravenous Every 4 hours 02/21/17 1726 02/21/17 1740   02/19/17 1230  ceFAZolin (ANCEF) IVPB 2g/100 mL premix  Status:  Discontinued     2 g 200 mL/hr over 30 Minutes Intravenous Every 8 hours 02/19/17 1148 02/21/17 1726   02/18/17 1330  linezolid (ZYVOX) tablet 600 mg  Status:  Discontinued     600 mg Oral Every 12 hours 02/18/17 1250 02/19/17 1126   02/17/17 0800  sulfamethoxazole-trimethoprim (BACTRIM DS,SEPTRA DS) 800-160 MG per tablet 2 tablet  Status:  Discontinued     2 tablet Oral Every 12 hours 02/16/17 1453 02/18/17 1250   02/16/17 1600  sulfamethoxazole-trimethoprim (BACTRIM DS,SEPTRA DS) 800-160 MG per tablet 2 tablet     2 tablet Oral  Once 02/16/17 1509 02/16/17 1657   02/14/17 1400  ceFAZolin (ANCEF) IVPB 2g/100 mL premix  Status:  Discontinued     2 g 200 mL/hr over 30 Minutes Intravenous Every 8 hours 02/14/17 1013 02/16/17 1453   02/14/17 0200  vancomycin (VANCOCIN) IVPB 750 mg/150 ml premix  Status:  Discontinued     750 mg 150 mL/hr over 60 Minutes Intravenous Every 8 hours 02/13/17 1739 02/14/17 1017   02/14/17 0000  piperacillin-tazobactam (ZOSYN) IVPB 3.375 g  Status:  Discontinued     3.375 g 12.5 mL/hr over 240 Minutes Intravenous Every 8 hours 02/13/17 1739 02/14/17 1013   02/13/17 1715  piperacillin-tazobactam (ZOSYN) IVPB 3.375 g     3.375 g 100 mL/hr over 30 Minutes Intravenous  Once 02/13/17 1701 02/13/17 1824   02/13/17 1715  vancomycin (VANCOCIN) IVPB 1000 mg/200 mL premix  Status:  Discontinued     1,000 mg 200 mL/hr over 60 Minutes Intravenous  Once 02/13/17 1701 02/13/17  1705   02/13/17 1715  vancomycin (VANCOCIN) 1,500 mg in sodium chloride 0.9 % 500 mL IVPB     1,500 mg 250 mL/hr over 120 Minutes Intravenous  Once 02/13/17 1705 02/13/17 2300      Medications: Scheduled Meds: . buprenorphine-naloxone  2 tablet Sublingual Daily  . DULoxetine  60 mg Oral BID  . enoxaparin (LOVENOX) injection  40 mg Subcutaneous Q24H  . feeding supplement (ENSURE ENLIVE)  237 mL Oral BID BM  . ferrous sulfate  325 mg Oral TID WC  . multivitamin with minerals  1 tablet Oral Daily  . OLANZapine  5 mg Oral q morning - 10a  . polyethylene glycol  17 g Oral Daily  . senna-docusate  1 tablet Oral BID   Continuous Infusions: . nafcillin IV     PRN Meds:.acetaminophen, ALPRAZolam, cyclobenzaprine, ondansetron **OR** ondansetron (ZOFRAN) IV    Objective: Weight change:   Intake/Output Summary (Last 24 hours) at 02/21/2017 1859 Last data filed at 02/21/2017 1500 Gross per 24 hour  Intake 300 ml  Output -  Net 300 ml   Blood pressure (!) 103/59, pulse 86, temperature 98 F (36.7 C), temperature source Oral, resp. rate 18, height 5\' 7"  (1.702 m), weight 177 lb 14.6 oz (80.7 kg),  last menstrual period 01/28/2017, SpO2 96 %. Temp:  [98 F (36.7 C)-99 F (37.2 C)] 98 F (36.7 C) (02/08 1330) Pulse Rate:  [79-90] 86 (02/08 1330) Resp:  [18] 18 (02/08 1330) BP: (85-103)/(51-59) 103/59 (02/08 1330) SpO2:  [94 %-98 %] 96 % (02/08 1330)  Physical Exam: General: Alert and awake, oriented x3,and dysphoric HEENT: anicteric sclera,EOMI CVS regular rate, normal r,  no murmur rubs or gallops Chest: clear to auscultation bilaterally, no wheezing, rales or rhonchi Abdomen: soft nontender, nondistended, normal bowel sounds, Extremities: she has minimal tenderness of clavicle Skin: rashes resolving on feet  Neuro: she does still have weakness in lower extremities  CBC:  CBC Latest Ref Rng & Units 02/21/2017 02/20/2017 02/20/2017  WBC 4.0 - 10.5 K/uL 8.8 - 11.8(H)  Hemoglobin 12.0  - 15.0 g/dL 7.5(L) 8.2(L) 6.7(LL)  Hematocrit 36.0 - 46.0 % 22.9(L) 24.0(L) 19.7(L)  Platelets 150 - 400 K/uL 387 - 417(H)      BMET Recent Labs    02/20/17 0408 02/20/17 1305 02/21/17 0652  NA 133* 136 133*  K 4.4 4.5 4.3  CL 99*  --  99*  CO2 21*  --  21*  GLUCOSE 87 83 89  BUN 13  --  14  CREATININE 1.03*  --  0.83  CALCIUM 8.5*  --  8.9     Liver Panel  No results for input(s): PROT, ALBUMIN, AST, ALT, ALKPHOS, BILITOT, BILIDIR, IBILI in the last 72 hours.     Sedimentation Rate No results for input(s): ESRSEDRATE in the last 72 hours. C-Reactive Protein No results for input(s): CRP in the last 72 hours.  Micro Results: Recent Results (from the past 720 hour(s))  Culture, blood (Routine x 2)     Status: Abnormal   Collection Time: 02/13/17  4:00 PM  Result Value Ref Range Status   Specimen Description BLOOD RIGHT ANTECUBITAL  Final   Special Requests IN PEDIATRIC BOTTLE Blood Culture adequate volume  Final   Culture  Setup Time   Final    GRAM POSITIVE COCCI IN PEDIATRIC BOTTLE CRITICAL RESULT CALLED TO, READ BACK BY AND VERIFIED WITHMelven Sartorius: J [facility] [id number] 02/14/17 A BROWNING Performed at [facility], [address] [place], [place] [address]    Culture STAPHYLOCOCCUS AUREUS (A)  Final   Report Status 02/17/2017 FINAL  Final   Organism ID, Bacteria STAPHYLOCOCCUS AUREUS  Final      Susceptibility   Staphylococcus aureus - MIC*    CIPROFLOXACIN <=0.5 SENSITIVE Sensitive     ERYTHROMYCIN <=0.25 SENSITIVE Sensitive     GENTAMICIN <=0.5 SENSITIVE Sensitive     OXACILLIN <=0.25 SENSITIVE Sensitive     TETRACYCLINE >=16 RESISTANT Resistant     VANCOMYCIN 1 SENSITIVE Sensitive     TRIMETH/SULFA <=10 SENSITIVE Sensitive     CLINDAMYCIN <=0.25 SENSITIVE Sensitive     RIFAMPIN <=0.5 SENSITIVE Sensitive     Inducible Clindamycin NEGATIVE Sensitive     * STAPHYLOCOCCUS AUREUS  Blood Culture ID Panel (Reflexed)     Status: Abnormal   Collection Time:  02/13/17  4:00 PM  Result Value Ref Range Status   Enterococcus species NOT DETECTED NOT DETECTED Final   Listeria monocytogenes NOT DETECTED NOT DETECTED Final   Staphylococcus species DETECTED (A) NOT DETECTED Final    Comment: CRITICAL RESULT CALLED TO, READ BACK BY AND VERIFIED WITH: J [facility] PHAR[facility] [id number] 02/14/17 A BROWNING    Staphylococcus aureus DETECTED (A) NOT DETECTED Final    Comment: Methicillin (  oxacillin) susceptible Staphylococcus aureus (MSSA). Preferred therapy is anti staphylococcal beta lactam antibiotic (Cefazolin or Nafcillin), unless clinically contraindicated. CRITICAL RESULT CALLED TO, READ BACK BY AND VERIFIED WITH: Melven Sartorius PHARMD [id number] 02/14/17 A BROWNING    Methicillin resistance NOT DETECTED NOT DETECTED Final   Streptococcus species NOT DETECTED NOT DETECTED Final   Streptococcus agalactiae NOT DETECTED NOT DETECTED Final   Streptococcus pneumoniae NOT DETECTED NOT DETECTED Final   Streptococcus pyogenes NOT DETECTED NOT DETECTED Final   Acinetobacter baumannii NOT DETECTED NOT DETECTED Final   Enterobacteriaceae species NOT DETECTED NOT DETECTED Final   Enterobacter cloacae complex NOT DETECTED NOT DETECTED Final   Escherichia coli NOT DETECTED NOT DETECTED Final   Klebsiella oxytoca NOT DETECTED NOT DETECTED Final   Klebsiella pneumoniae NOT DETECTED NOT DETECTED Final   Proteus species NOT DETECTED NOT DETECTED Final   Serratia marcescens NOT DETECTED NOT DETECTED Final   Haemophilus influenzae NOT DETECTED NOT DETECTED Final   Neisseria meningitidis NOT DETECTED NOT DETECTED Final   Pseudomonas aeruginosa NOT DETECTED NOT DETECTED Final   Candida albicans NOT DETECTED NOT DETECTED Final   Candida glabrata NOT DETECTED NOT DETECTED Final   Candida krusei NOT DETECTED NOT DETECTED Final   Candida parapsilosis NOT DETECTED NOT DETECTED Final   Candida tropicalis NOT DETECTED NOT DETECTED Final    Comment: Performed at [facility], [address] [place], [place] [address]  Culture, blood (Routine x 2)     Status: Abnormal   Collection Time: 02/13/17  5:00 PM  Result Value Ref Range Status   Specimen Description BLOOD RIGHT ANTECUBITAL  Final   Special Requests   Final    BOTTLES DRAWN AEROBIC AND ANAEROBIC Blood Culture adequate volume   Culture  Setup Time   Final    GRAM POSITIVE COCCI IN CLUSTERS IN BOTH AEROBIC AND ANAEROBIC BOTTLES CRITICAL VALUE NOTED.  VALUE IS CONSISTENT WITH PREVIOUSLY REPORTED AND CALLED VALUE.    Culture (A)  Final    STAPHYLOCOCCUS AUREUS SUSCEPTIBILITIES PERFORMED ON PREVIOUS CULTURE WITHIN THE LAST 5 DAYS. Performed at [facility], [address], [place], [place] [address]    Report Status 02/16/2017 FINAL  Final  MRSA PCR Screening     Status: None   Collection Time: 02/13/17  9:32 PM  Result Value Ref Range Status   MRSA by PCR NEGATIVE NEGATIVE Final    Comment:        The GeneXpert MRSA Assay (FDA approved for NASAL specimens only), is one component of a comprehensive MRSA colonization surveillance program. It is not intended to diagnose MRSA infection nor to guide or monitor treatment for MRSA infections.   Culture, blood (Routine X 2) w Reflex to ID Panel     Status: Abnormal   Collection Time: 02/14/17 10:07 AM  Result Value Ref Range Status   Specimen Description BLOOD RIGHT HAND  Final   Special Requests IN PEDIATRIC BOTTLE Blood Culture adequate volume  Final   Culture  Setup Time   Final    GRAM POSITIVE COCCI IN PEDIATRIC BOTTLE CRITICAL VALUE NOTED.  VALUE IS CONSISTENT WITH PREVIOUSLY REPORTED AND CALLED VALUE.    Culture (A)  Final    STAPHYLOCOCCUS AUREUS SUSCEPTIBILITIES PERFORMED ON PREVIOUS CULTURE WITHIN THE LAST 5 DAYS. Performed at [facility], [address] [place], [place] [address]    Report Status 02/17/2017 FINAL  Final  Culture, blood (Routine X 2) w Reflex to ID Panel  Status: Abnormal   Collection Time: 02/17/17 10:10 AM  Result  Value Ref Range Status   Specimen Description BLOOD RIGHT HAND  Final   Special Requests IN PEDIATRIC BOTTLE Blood Culture adequate volume  Final   Culture  Setup Time   Final    GRAM POSITIVE COCCI IN PEDIATRIC BOTTLE CRITICAL VALUE NOTED.  VALUE IS CONSISTENT WITH PREVIOUSLY REPORTED AND CALLED VALUE.    Culture (A)  Final    STAPHYLOCOCCUS AUREUS SUSCEPTIBILITIES PERFORMED ON PREVIOUS CULTURE WITHIN THE LAST 5 DAYS. Performed at [facility], [address] [place], [place] [address]    Report Status 02/19/2017 FINAL  Final  Culture, blood (Routine X 2) w Reflex to ID Panel     Status: None (Preliminary result)   Collection Time: 02/17/17 10:15 AM  Result Value Ref Range Status   Specimen Description BLOOD LEFT ANTECUBITAL  Final   Special Requests IN PEDIATRIC BOTTLE Blood Culture adequate volume  Final   Culture   Final    NO GROWTH 4 DAYS Performed at [facility], [address] [place], [place] [address]    Report Status PENDING  Incomplete  Culture, blood (Routine X 2) w Reflex to ID Panel     Status: None (Preliminary result)   Collection Time: 02/19/17  9:15 AM  Result Value Ref Range Status   Specimen Description BLOOD LEFT ANTECUBITAL  Final   Special Requests   Final    BOTTLES DRAWN AEROBIC AND ANAEROBIC Blood Culture adequate volume   Culture   Final    NO GROWTH 2 DAYS Performed at [facility], [address], [place], [place] [address]    Report Status PENDING  Incomplete  Culture, blood (Routine X 2) w Reflex to ID Panel     Status: None (Preliminary result)   Collection Time: 02/19/17  9:30 AM  Result Value Ref Range Status   Specimen Description BLOOD RIGHT ANTECUBITAL  Final   Special Requests   Final    BOTTLES DRAWN AEROBIC AND ANAEROBIC Blood Culture adequate volume   Culture   Final    NO GROWTH 2 DAYS Performed at [facility], [address], [place], [place] [address]    Report Status PENDING  Incomplete     Studies/Results: Ct Chest W Contrast  Result Date: 02/20/2017 CLINICAL DATA:  Acute onset of generalized chest pain. Assess for sternoclavicular septic arthritis. EXAM: CT CHEST WITH CONTRAST TECHNIQUE: Multidetector CT imaging of the chest was performed during intravenous contrast administration. CONTRAST:  75 mL ISOVUE-300 IOPAMIDOL (ISOVUE-300) INJECTION 61% COMPARISON:  MRI of the thoracic spine performed 02/17/2017 FINDINGS: Cardiovascular: The heart is borderline normal in size. The thoracic aorta is grossly unremarkable. A retroesophageal right subclavian artery is incidentally noted. The great vessels are unremarkable in appearance. Mediastinum/Nodes: Enlarged mediastinal nodes are noted at the subcarinal and azygoesophageal regions, aortopulmonary window, right paratracheal region and periaortic region, measuring up to 1.4 cm in short axis. Mildly prominent bilateral hilar nodes measure up to the 1.2 cm in short axis. No pericardial effusion is identified. The visualized portions of the thyroid gland are unremarkable. No axillary lymphadenopathy is appreciated. Lungs/Pleura: There is patchy airspace opacification involving the lower lobes bilaterally. Scattered nodular opacities are seen throughout both lungs. A small cavitary nodule is suggested at the right lung apex. This may reflect diffuse infection, possibly atypical in nature. Underlying metastatic disease cannot be excluded. Trace bilateral pleural fluid is noted.  No pneumothorax is  seen. Upper Abdomen: The visualized portions of the liver are unremarkable. The spleen is enlarged, measuring 14.6 cm in length. The patient is status post cholecystectomy, with clips noted at the gallbladder fossa. The visualized portions of the pancreas and adrenal glands are within normal limits. Musculoskeletal: There is asymmetric prominence of soft tissue density tracking about the right sternoclavicular joint, with mild underlying soft tissue  inflammation. Given clinical concern, this could reflect right sternoclavicular joint septic arthritis. IMPRESSION: 1. Patchy airspace opacification involving the lower lung lobes bilaterally. Scattered nodular opacities throughout both lungs. Small cavitary nodule suggested at the right lung apex. Findings are concerning for diffuse infection, possibly atypical in nature. Underlying metastatic disease cannot be excluded. Would correlate clinically, and perform follow-up CT of the chest 3-4 weeks after completion of treatment for pneumonia. 2. Trace bilateral pleural fluid noted. 3. Asymmetric prominence of soft tissue density tracking about the right sternoclavicular joint, with mild underlying soft tissue inflammation. Given clinical concern, this could reflect right-sided clavicular joint septic arthritis. 4. Diffusely prominent mediastinal and bilateral hilar nodes noted. This may reflect the underlying infection. 5. Retroesophageal right subclavian artery incidentally noted. Electronically Signed   By: Roanna Raider M.D.   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  There was a large PFO    Principal Problem:   MSSA bacteremia Active Problems:   Sepsis affecting skin (HCC)   Pelvic pain   Cellulitis of left hand   Staphylococcus aureus bacteremia with sepsis (HCC)   Weakness of both lower extremities   Pain of right clavicle   IVDU (intravenous drug user)   Septic arthritis of right sternoclavicular joint (HCC)    [name] is a 32 y.o. 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  Specific aspects of clinical management are critical to optimizing the outcome of patients with SAB.  Therefore, the [facility] Antimicrobial Management Team [facility]) has initiated an intervention aimed at improving the management of SAB at [facility].  To do so, Infectious Diseases physicians are providing an evidence-based consult for the management of all patients with SAB.     Yes No Comments  Perform follow-up blood cultures (even if the patient is afebrile) to ensure clearance of bacteremia [x]  []  02/19/17 culture NO GROWTH x 48 hours  Remove vascular catheter and obtain follow-up blood cultures after the removal of the catheter []  []  DO NOT PLACE PICC  Perform echocardiography to evaluate for endocarditis (transthoracic ECHO is 40-50% sensitive, TEE is > 90% sensitive) []  []  Please keep in mind, that neither test can definitively EXCLUDE endocarditis, and that should clinical suspicion remain high for endocarditis the patient should then still be treated with an "endocarditis" duration of therapy = 6 weeks  She has vegetation with possible perforation on TV w mod to severe TR and PFO  Consult electrophysiologist to evaluate implanted cardiac device (pacemaker, ICD) []  []    Ensure source control []  []  Have all abscesses been drained effectively? Have deep seeded infections (septic joints or osteomyelitis) had appropriate surgical debridement?  SHE NEEDS TO BE SEEN BY CARDIOTHORACIC SURGERY  Investigate for "metastatic" sites of infection []  []  Does the patient have ANY symptom or physical exam finding that would suggest a deeper infection (back or neck pain that may be suggestive of vertebral osteomyelitis or epidural abscess, muscle pain that could be a symptom of pyomyositis)?  Keep in mind that for deep seeded infections MRI imaging with contrast is preferred rather than other often insensitive tests such as plain x-rays, especially early in a patient's presentation.   GIVEN  PFO AND LE WEAKNESS NEED TO IMAGE THE CNS PREFERABLY WITH MRI.  HOWEVER THE PATIENT IS ANXIOUS ABOUT MRI  IF SHE CANNOT TOLERATE MRI WOULD GET CT OF BRAIN WITH CONTRAST  Change antibiotic therapy to NAFCILLIN GIVEN PFO AND CONCERN ABOUT SEPTIC EMBOLI TO CNS []  []  Beta-lactam antibiotics are preferred for MSSA due to higher cure rates.   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  Dr. Orvan Falconer is available for questions this week and I will follow-up her culture data.   LOS: 8 days   Acey Lav 02/21/2017, 6:59 PM

## 2017-02-21 NOTE — Progress Notes (Signed)
Physical Therapy Treatment Patient Details Name: [name] MRN: [id number] DOB: [birth year] Today's Date: 02/21/2017    History of Present Illness Pt is a 32 y.o. female, with past medical history significant for fibromyalgia, anemia and history of IV drug abuse. She presented to the ED with a few days history of fever, chills, and generalized weakness muscle aches in addition to arthralgias.  She was admitted with diagnosis of sepsis.     PT Comments    Pt making steady progress towards achieving her current functional mobility. She remains limited secondary to bilateral LE and groin pain. Pt would continue to benefit from skilled physical therapy services at this time while admitted and after d/c to address the below listed limitations in order to improve overall safety and independence with functional mobility.    Follow Up Recommendations  Home health PT;Supervision/Assistance - 24 hour     Equipment Recommendations  Rolling walker with 5" wheels    Recommendations for Other Services       Precautions / Restrictions Precautions Precautions: Fall Restrictions Weight Bearing Restrictions: No    Mobility  Bed Mobility               General bed mobility comments: pt sitting EOB  Transfers Overall transfer level: Needs assistance Equipment used: Rolling walker (2 wheeled) Transfers: Sit to/from Stand Sit to Stand: Min guard         General transfer comment: min guard for safety  Ambulation/Gait Ambulation/Gait assistance: Min guard Ambulation Distance (Feet): 25 Feet Assistive device: Rolling walker (2 wheeled) Gait Pattern/deviations: Step-through pattern;Decreased stride length Gait velocity: decreased Gait velocity interpretation: Below normal speed for age/gender General Gait Details: pt very slow, cautious, guarded and very limited secondary to bilateral LE and groin pain   Stairs            Wheelchair Mobility    Modified Rankin (Stroke  Patients Only)       Balance Overall balance assessment: Needs assistance Sitting-balance support: No upper extremity supported;Feet supported Sitting balance-Leahy Scale: Good     Standing balance support: During functional activity Standing balance-Leahy Scale: Fair                              Cognition Arousal/Alertness: Awake/alert Behavior During Therapy: WFL for tasks assessed/performed Overall Cognitive Status: Within Functional Limits for tasks assessed                                        Exercises      General Comments        Pertinent Vitals/Pain Pain Assessment: Faces Faces Pain Scale: Hurts little more Pain Location: bilateral LEs Pain Descriptors / Indicators: Sore;Guarding;Grimacing Pain Intervention(s): Monitored during session;Repositioned    Home Living                      Prior Function            PT Goals (current goals can now be found in the care plan section) Acute Rehab PT Goals PT Goal Formulation: With patient Time For Goal Achievement: 03/03/17 Potential to Achieve Goals: Good Progress towards PT goals: Progressing toward goals    Frequency    Min 3X/week      PT Plan Current plan remains appropriate    Co-evaluation  AM-PAC PT "6 Clicks" Daily Activity  Outcome Measure  Difficulty turning over in bed (including adjusting bedclothes, sheets and blankets)?: None Difficulty moving from lying on back to sitting on the side of the bed? : A Little Difficulty sitting down on and standing up from a chair with arms (e.g., wheelchair, bedside commode, etc,.)?: Unable Help needed moving to and from a bed to chair (including a wheelchair)?: None Help needed walking in hospital room?: A Little Help needed climbing 3-5 steps with a railing? : A Little 6 Click Score: 18    End of Session Equipment Utilized During Treatment: Gait belt Activity Tolerance: Patient limited by  pain Patient left: in bed;with call bell/phone within reach Nurse Communication: Mobility status PT Visit Diagnosis: Muscle weakness (generalized) (M62.81);Difficulty in walking, not elsewhere classified (R26.2);Pain Pain - Right/Left: Right(bilateral) Pain - part of body: Leg(groin)     Time: [id number] PT Time Calculation (min) (ACUTE ONLY): 14 min  Charges:  $Gait Training: 8-22 mins                    G Codes:       PlantationJennifer Kathrynne Kulinski, [place], [place] [phone]    Alessandra BevelsJennifer M Santiel Topper 02/21/2017, 5:15 PM

## 2017-02-22 ENCOUNTER — Inpatient Hospital Stay (HOSPITAL_COMMUNITY): Payer: Medicaid Other

## 2017-02-22 LAB — CBC WITH DIFFERENTIAL/PLATELET
BASOS PCT: 0 %
Basophils Absolute: 0 10*3/uL (ref 0.0–0.1)
Eosinophils Absolute: 0.1 10*3/uL (ref 0.0–0.7)
Eosinophils Relative: 1 %
HCT: 24.8 % — ABNORMAL LOW (ref 36.0–46.0)
HEMOGLOBIN: 8.1 g/dL — AB (ref 12.0–15.0)
Lymphocytes Relative: 34 %
Lymphs Abs: 3.2 10*3/uL (ref 0.7–4.0)
MCH: 28.8 pg (ref 26.0–34.0)
MCHC: 32.7 g/dL (ref 30.0–36.0)
MCV: 88.3 fL (ref 78.0–100.0)
MONOS PCT: 6 %
Monocytes Absolute: 0.6 10*3/uL (ref 0.1–1.0)
NEUTROS PCT: 59 %
Neutro Abs: 5.5 10*3/uL (ref 1.7–7.7)
Platelets: 364 10*3/uL (ref 150–400)
RBC: 2.81 MIL/uL — ABNORMAL LOW (ref 3.87–5.11)
RDW: 15.3 % (ref 11.5–15.5)
WBC: 9.4 10*3/uL (ref 4.0–10.5)

## 2017-02-22 LAB — BASIC METABOLIC PANEL
Anion gap: 13 (ref 5–15)
BUN: 13 mg/dL (ref 6–20)
CALCIUM: 8.7 mg/dL — AB (ref 8.9–10.3)
CHLORIDE: 98 mmol/L — AB (ref 101–111)
CO2: 21 mmol/L — ABNORMAL LOW (ref 22–32)
CREATININE: 0.74 mg/dL (ref 0.44–1.00)
GFR calc non Af Amer: >60 mL/min (ref >60)
Glucose, Bld: 90 mg/dL (ref 65–99)
Potassium: 4.2 mmol/L (ref 3.5–5.1)
SODIUM: 132 mmol/L — AB (ref 135–145)

## 2017-02-22 LAB — CULTURE, BLOOD (ROUTINE X 2)
Culture: NO GROWTH
SPECIAL REQUESTS: ADEQUATE

## 2017-02-22 MED ORDER — GADOBENATE DIMEGLUMINE 529 MG/ML IV SOLN
15.0000 mL | Freq: Once | INTRAVENOUS | Status: AC | PRN
  Administered 2017-02-27: 15 mL via INTRAVENOUS

## 2017-02-22 MED ORDER — GADOBENATE DIMEGLUMINE 529 MG/ML IV SOLN
15.0000 mL | Freq: Once | INTRAVENOUS | Status: AC
  Administered 2017-02-22: 15 mL via INTRAVENOUS

## 2017-02-22 NOTE — Progress Notes (Signed)
PROGRESS NOTE  [name] ZOX:[id number] DOB: 19-Aug-1985 DOA: 02/13/2017 PCP: Pearson Grippe, MD  HPI/Recap of past 23 hours: 32 year old female who presented with a chief complaint weakness.  She does have a significant past medical history for fibromyalgia, chronic anemia and history intravenous drug abuse. Complaint of fever, chills, generalized weakness for the last 2 days prior to hospitalization.  She had erythema and petechiae on her left hand and bilateral lower extremities. Patient was admitted to the hospital with diagnosis of sepsis, due to left hand cellulitis, rule out endocarditis.  Today, patient reported no new complaints, denied any worsening SOB, chest pain, fever/chills. Spoke at length with step dad who was furious about the conversation between him and the cardiothoracic surgeon, demands to speak with the hospital administrator. Also considering transfer to [facility]  Assessment/Plan: Principal Problem:   MSSA bacteremia Active Problems:   Sepsis affecting skin (HCC)   Pelvic pain   Cellulitis of left hand   Staphylococcus aureus bacteremia with sepsis (HCC)   Weakness of both lower extremities   Pain of right clavicle   IVDU (intravenous drug user)   Septic arthritis of right sternoclavicular joint (HCC)  Staphylococcus aureus bacteremia, complicated with sepsis Afebrile with leukocytosis BC positive with staph aureus, repeat shows no growth in 24 hours On IV Nafcillin MRI brain with contrast pending to r/o any ?? Septic emboli (pt c/o of LE weakness + PFO) ID on board  Tricuspid valve endocarditis with mod-severe TR Pt had TEE on 2/7/9 which showed tricuspid valve endocarditis with ?perforated leaflet, with mod-severe TR. A large PFO was also noted on bubble study ID on board Called consult to CTS Dr Tyrone Sage: Would not recommend surgery at this time. Close follow up with cardiology for development of CHF  Right sternoclavicular joint septic arthritis CT  chest showed above after pt c/o R shoulder pain Consult to CTS as above  Acute on chronic anemia of iron deficiency Hgb 6.7, transfused 1U of PRBC on 02/20/17 Iron panel with iron 14, sats 9% Will transfuse if hgb <7 Continue iron supplements and prophylactic bowel regimen  Urinary tract infection Completed antibiotic therapy  Hyperkalemia Resolved Daily BMET  Thrombocytopenia Resolved  IV drug abuse No signs of withdrawal, will continue neuro checks per unit protocol As needed alprazolam, continue suboxone.   Depression Continue cymbalta and olanzapine   Code Status: Full  Family Communication: Spoke to step-dad  Disposition Plan: Home   Consultants:  ID  CIR  CTS  Procedures:  None  Antimicrobials:  IV Nafcillin   DVT prophylaxis:  Lovenox   Objective: Vitals:   02/21/17 1330 02/21/17 2236 02/22/17 0558 02/22/17 1311  BP: (!) 103/59 104/62 109/67 (!) 96/58  Pulse: 86 71 82 86  Resp: 18 18 18  (!) 24  Temp: 98 F (36.7 C) 98.6 F (37 C) 98.6 F (37 C) 99.6 F (37.6 C)  TempSrc: Oral Oral Oral Oral  SpO2: 96% 94% 94% 98%  Weight:      Height:        Intake/Output Summary (Last 24 hours) at 02/22/2017 1810 Last data filed at 02/22/2017 1716 Gross per 24 hour  Intake 1140 ml  Output -  Net 1140 ml   Filed Weights   02/13/17 2120 02/17/17 1500  Weight: 76.5 kg (168 lb 10.4 oz) 80.7 kg (177 lb 14.6 oz)    Exam:   General:  Alert, awake, oriented, deconditioned  Cardiovascular: S1-S2 present, no added heart sounds  Respiratory: Chest clear bilaterally  Abdomen: Soft, nontender, nondistended, bowel sounds present  Musculoskeletal: No pedal edema bilaterally  Skin: Improved rash on the left hand  Psychiatry: Normal mood   Data Reviewed: CBC: Recent Labs  Lab 02/18/17 0503 02/19/17 0352 02/20/17 0408 02/20/17 1305 02/21/17 0652 02/22/17 0212  WBC 14.5* 13.2* 11.8*  --  8.8 9.4  NEUTROABS  --  8.9* 7.9*  --  5.8 5.5    HGB 7.0* 7.1* 6.7* 8.2* 7.5* 8.1*  HCT 20.3* 21.4* 19.7* 24.0* 22.9* 24.8*  MCV 83.5 84.3 85.7  --  87.4 88.3  PLT 281 340 417*  --  387 364   Basic Metabolic Panel: Recent Labs  Lab 02/18/17 0503 02/19/17 0352 02/19/17 0921 02/20/17 0408 02/20/17 1305 02/21/17 0652 02/22/17 0212  NA 135 135  --  133* 136 133* 132*  K 5.0 6.3* 5.2* 4.4 4.5 4.3 4.2  CL 100* 102  --  99*  --  99* 98*  CO2 22 18*  --  21*  --  21* 21*  GLUCOSE 79 74  --  87 83 89 90  BUN 10 13  --  13  --  14 13  CREATININE 1.06* 1.00  --  1.03*  --  0.83 0.74  CALCIUM 8.2* 8.7*  --  8.5*  --  8.9 8.7*   GFR: Estimated Creatinine Clearance: 110.3 mL/min (by C-G formula based on SCr of 0.74 mg/dL). Liver Function Tests: Recent Labs  Lab 02/16/17 0252 02/18/17 0503  AST 49* 50*  ALT 22 22  ALKPHOS 186* 173*  BILITOT 2.1* 1.4*  PROT 5.1* 6.1*  ALBUMIN 1.5* 1.6*   No results for input(s): LIPASE, AMYLASE in the last 168 hours. No results for input(s): AMMONIA in the last 168 hours. Coagulation Profile: No results for input(s): INR, PROTIME in the last 168 hours. Cardiac Enzymes: No results for input(s): CKTOTAL, CKMB, CKMBINDEX, TROPONINI in the last 168 hours. BNP (last 3 results) No results for input(s): PROBNP in the last 8760 hours. HbA1C: No results for input(s): HGBA1C in the last 72 hours. CBG: No results for input(s): GLUCAP in the last 168 hours. Lipid Profile: No results for input(s): CHOL, HDL, LDLCALC, TRIG, CHOLHDL, LDLDIRECT in the last 72 hours. Thyroid Function Tests: No results for input(s): TSH, T4TOTAL, FREET4, T3FREE, THYROIDAB in the last 72 hours. Anemia Panel: No results for input(s): VITAMINB12, FOLATE, FERRITIN, TIBC, IRON, RETICCTPCT in the last 72 hours. Urine analysis:    Component Value Date/Time   COLORURINE AMBER (A) 02/13/2017 0436   APPEARANCEUR HAZY (A) 02/13/2017 0436   LABSPEC 1.014 02/13/2017 0436   PHURINE 5.0 02/13/2017 0436   GLUCOSEU NEGATIVE  02/13/2017 0436   HGBUR LARGE (A) 02/13/2017 0436   BILIRUBINUR NEGATIVE 02/13/2017 0436   KETONESUR NEGATIVE 02/13/2017 0436   PROTEINUR NEGATIVE 02/13/2017 0436   UROBILINOGEN 0.2 12/07/2012 1510   NITRITE POSITIVE (A) 02/13/2017 0436   LEUKOCYTESUR TRACE (A) 02/13/2017 0436   Sepsis Labs: @LABRCNTIP (procalcitonin:4,lacticidven:4)  ) Recent Results (from the past 240 hour(s))  Culture, blood (Routine x 2)     Status: Abnormal   Collection Time: 02/13/17  4:00 PM  Result Value Ref Range Status   Specimen Description BLOOD RIGHT ANTECUBITAL  Final   Special Requests IN PEDIATRIC BOTTLE Blood Culture adequate volume  Final   Culture  Setup Time   Final    GRAM POSITIVE COCCI IN PEDIATRIC BOTTLE CRITICAL RESULT CALLED TO, READ BACK BY AND VERIFIED WITHShela Commons [facility] PHARMD [id number] 02/14/17 A BROWNING Performed at  [facility], [address] [place], [place] [address]    Culture STAPHYLOCOCCUS AUREUS (A)  Final   Report Status 02/17/2017 FINAL  Final   Organism ID, Bacteria STAPHYLOCOCCUS AUREUS  Final      Susceptibility   Staphylococcus aureus - MIC*    CIPROFLOXACIN <=0.5 SENSITIVE Sensitive     ERYTHROMYCIN <=0.25 SENSITIVE Sensitive     GENTAMICIN <=0.5 SENSITIVE Sensitive     OXACILLIN <=0.25 SENSITIVE Sensitive     TETRACYCLINE >=16 RESISTANT Resistant     VANCOMYCIN 1 SENSITIVE Sensitive     TRIMETH/SULFA <=10 SENSITIVE Sensitive     CLINDAMYCIN <=0.25 SENSITIVE Sensitive     RIFAMPIN <=0.5 SENSITIVE Sensitive     Inducible Clindamycin NEGATIVE Sensitive     * STAPHYLOCOCCUS AUREUS  Blood Culture ID Panel (Reflexed)     Status: Abnormal   Collection Time: 02/13/17  4:00 PM  Result Value Ref Range Status   Enterococcus species NOT DETECTED NOT DETECTED Final   Listeria monocytogenes NOT DETECTED NOT DETECTED Final   Staphylococcus species DETECTED (A) NOT DETECTED Final    Comment: CRITICAL RESULT CALLED TO, READ BACK BY AND VERIFIED WITH: [facility] PHARMD [id number]  02/14/17 A BROWNING    Staphylococcus aureus DETECTED (A) NOT DETECTED Final    Comment: Methicillin (oxacillin) susceptible Staphylococcus aureus (MSSA). Preferred therapy is anti staphylococcal beta lactam antibiotic (Cefazolin or Nafcillin), unless clinically contraindicated. CRITICAL RESULT CALLED TO, READ BACK BY AND VERIFIED WITH: Melven Sartorius PHARMD [id number] 02/14/17 A BROWNING    Methicillin resistance NOT DETECTED NOT DETECTED Final   Streptococcus species NOT DETECTED NOT DETECTED Final   Streptococcus agalactiae NOT DETECTED NOT DETECTED Final   Streptococcus pneumoniae NOT DETECTED NOT DETECTED Final   Streptococcus pyogenes NOT DETECTED NOT DETECTED Final   Acinetobacter baumannii NOT DETECTED NOT DETECTED Final   Enterobacteriaceae species NOT DETECTED NOT DETECTED Final   Enterobacter cloacae complex NOT DETECTED NOT DETECTED Final   Escherichia coli NOT DETECTED NOT DETECTED Final   Klebsiella oxytoca NOT DETECTED NOT DETECTED Final   Klebsiella pneumoniae NOT DETECTED NOT DETECTED Final   Proteus species NOT DETECTED NOT DETECTED Final   Serratia marcescens NOT DETECTED NOT DETECTED Final   Haemophilus influenzae NOT DETECTED NOT DETECTED Final   Neisseria meningitidis NOT DETECTED NOT DETECTED Final   Pseudomonas aeruginosa NOT DETECTED NOT DETECTED Final   Candida albicans NOT DETECTED NOT DETECTED Final   Candida glabrata NOT DETECTED NOT DETECTED Final   Candida krusei NOT DETECTED NOT DETECTED Final   Candida parapsilosis NOT DETECTED NOT DETECTED Final   Candida tropicalis NOT DETECTED NOT DETECTED Final    Comment: Performed at [facility], [address], [place], [place] [address]  Culture, blood (Routine x 2)     Status: Abnormal   Collection Time: 02/13/17  5:00 PM  Result Value Ref Range Status   Specimen Description BLOOD RIGHT ANTECUBITAL  Final   Special Requests   Final    BOTTLES DRAWN AEROBIC AND ANAEROBIC Blood Culture adequate volume   Culture   Setup Time   Final    GRAM POSITIVE COCCI IN CLUSTERS IN BOTH AEROBIC AND ANAEROBIC BOTTLES CRITICAL VALUE NOTED.  VALUE IS CONSISTENT WITH PREVIOUSLY REPORTED AND CALLED VALUE.    Culture (A)  Final    STAPHYLOCOCCUS AUREUS SUSCEPTIBILITIES PERFORMED ON PREVIOUS CULTURE WITHIN THE LAST 5 DAYS. Performed at [facility], [address], [place], [place] [address]    Report Status  02/16/2017 FINAL  Final  MRSA PCR Screening     Status: None   Collection Time: 02/13/17  9:32 PM  Result Value Ref Range Status   MRSA by PCR NEGATIVE NEGATIVE Final    Comment:        The GeneXpert MRSA Assay (FDA approved for NASAL specimens only), is one component of a comprehensive MRSA colonization surveillance program. It is not intended to diagnose MRSA infection nor to guide or monitor treatment for MRSA infections.   Culture, blood (Routine X 2) w Reflex to ID Panel     Status: Abnormal   Collection Time: 02/14/17 10:07 AM  Result Value Ref Range Status   Specimen Description BLOOD RIGHT HAND  Final   Special Requests IN PEDIATRIC BOTTLE Blood Culture adequate volume  Final   Culture  Setup Time   Final    GRAM POSITIVE COCCI IN PEDIATRIC BOTTLE CRITICAL VALUE NOTED.  VALUE IS CONSISTENT WITH PREVIOUSLY REPORTED AND CALLED VALUE.    Culture (A)  Final    STAPHYLOCOCCUS AUREUS SUSCEPTIBILITIES PERFORMED ON PREVIOUS CULTURE WITHIN THE LAST 5 DAYS. Performed at [facility], [address] [place], [place] [address]    Report Status 02/17/2017 FINAL  Final  Culture, blood (Routine X 2) w Reflex to ID Panel     Status: Abnormal   Collection Time: 02/17/17 10:10 AM  Result Value Ref Range Status   Specimen Description BLOOD RIGHT HAND  Final   Special Requests IN PEDIATRIC BOTTLE Blood Culture adequate volume  Final   Culture  Setup Time   Final    GRAM POSITIVE COCCI IN PEDIATRIC BOTTLE CRITICAL VALUE NOTED.  VALUE IS CONSISTENT WITH PREVIOUSLY REPORTED AND CALLED VALUE.     Culture (A)  Final    STAPHYLOCOCCUS AUREUS SUSCEPTIBILITIES PERFORMED ON PREVIOUS CULTURE WITHIN THE LAST 5 DAYS. Performed at [facility], [address], [place], [place] [address]    Report Status 02/19/2017 FINAL  Final  Culture, blood (Routine X 2) w Reflex to ID Panel     Status: None   Collection Time: 02/17/17 10:15 AM  Result Value Ref Range Status   Specimen Description BLOOD LEFT ANTECUBITAL  Final   Special Requests IN PEDIATRIC BOTTLE Blood Culture adequate volume  Final   Culture   Final    NO GROWTH 5 DAYS Performed at [facility], [address], [place], [place] [address]    Report Status 02/22/2017 FINAL  Final  Culture, blood (Routine X 2) w Reflex to ID Panel     Status: None (Preliminary result)   Collection Time: 02/19/17  9:15 AM  Result Value Ref Range Status   Specimen Description BLOOD LEFT ANTECUBITAL  Final   Special Requests   Final    BOTTLES DRAWN AEROBIC AND ANAEROBIC Blood Culture adequate volume   Culture   Final    NO GROWTH 3 DAYS Performed at [facility], [address], [place], [place] [address]    Report Status PENDING  Incomplete  Culture, blood (Routine X 2) w Reflex to ID Panel     Status: None (Preliminary result)   Collection Time: 02/19/17  9:30 AM  Result Value Ref Range Status   Specimen Description BLOOD RIGHT ANTECUBITAL  Final   Special Requests   Final    BOTTLES DRAWN AEROBIC AND ANAEROBIC Blood Culture adequate volume   Culture   Final    NO GROWTH 3 DAYS Performed at [facility], [address] [place], [place] [id number]    Report Status PENDING  Incomplete      Studies: No results found.  Scheduled Meds: . buprenorphine-naloxone  2 tablet Sublingual Daily  . DULoxetine  60 mg Oral BID  . enoxaparin (LOVENOX) injection  40 mg Subcutaneous Q24H  . feeding supplement (ENSURE ENLIVE)  237 mL Oral BID BM  . ferrous sulfate  325 mg Oral TID WC  . multivitamin with minerals  1 tablet  Oral Daily  . OLANZapine  5 mg Oral q morning - 10a  . polyethylene glycol  17 g Oral Daily  . senna-docusate  1 tablet Oral BID    Continuous Infusions: . nafcillin IV Stopped (02/22/17 1613)     LOS: 9 days     Briant CedarNkeiruka J Ezenduka, MD [facility]  If 7PM-7AM, please contact night-coverage [email or web address] Password [email or web address] 02/22/2017, 6:10 PM

## 2017-02-22 NOTE — Progress Notes (Signed)
Notified Keenan BachelorSofia, MD that pt states she is having left sided chest pain with deep breaths that comes and goes. Will continue to monitor pt. Nelda MarseilleJenny Thacker, RN

## 2017-02-23 LAB — CBC WITH DIFFERENTIAL/PLATELET
BASOS ABS: 0 10*3/uL (ref 0.0–0.1)
Basophils Relative: 0 %
EOS PCT: 1 %
Eosinophils Absolute: 0.1 10*3/uL (ref 0.0–0.7)
HEMATOCRIT: 24.3 % — AB (ref 36.0–46.0)
Hemoglobin: 7.9 g/dL — ABNORMAL LOW (ref 12.0–15.0)
LYMPHS PCT: 29 %
Lymphs Abs: 2.5 10*3/uL (ref 0.7–4.0)
MCH: 28.8 pg (ref 26.0–34.0)
MCHC: 32.5 g/dL (ref 30.0–36.0)
MCV: 88.7 fL (ref 78.0–100.0)
Monocytes Absolute: 0.5 10*3/uL (ref 0.1–1.0)
Monocytes Relative: 6 %
NEUTROS ABS: 5.5 10*3/uL (ref 1.7–7.7)
Neutrophils Relative %: 64 %
PLATELETS: 413 10*3/uL — AB (ref 150–400)
RBC: 2.74 MIL/uL — AB (ref 3.87–5.11)
RDW: 15.6 % — ABNORMAL HIGH (ref 11.5–15.5)
WBC: 8.6 10*3/uL (ref 4.0–10.5)

## 2017-02-23 LAB — BASIC METABOLIC PANEL
ANION GAP: 13 (ref 5–15)
BUN: 11 mg/dL (ref 6–20)
CO2: 21 mmol/L — ABNORMAL LOW (ref 22–32)
Calcium: 8.6 mg/dL — ABNORMAL LOW (ref 8.9–10.3)
Chloride: 98 mmol/L — ABNORMAL LOW (ref 101–111)
Creatinine, Ser: 0.86 mg/dL (ref 0.44–1.00)
GLUCOSE: 122 mg/dL — AB (ref 65–99)
Potassium: 3.4 mmol/L — ABNORMAL LOW (ref 3.5–5.1)
Sodium: 132 mmol/L — ABNORMAL LOW (ref 135–145)

## 2017-02-23 NOTE — Progress Notes (Signed)
PROGRESS NOTE  [name] ZOX:[id number] DOB: 11/29/1985 DOA: 02/13/2017 PCP: Pearson Grippe, MD  HPI/Recap of past 36 hours: 32 year old female who presented with a chief complaint weakness.  She does have a significant past medical history for fibromyalgia, chronic anemia and history intravenous drug abuse. Complaint of fever, chills, generalized weakness for the last 2 days prior to hospitalization.  She had erythema and petechiae on her left hand and bilateral lower extremities. Patient was admitted to the hospital with diagnosis of sepsis, due to left hand cellulitis, rule out endocarditis.  Today, patient reported no new complaints, denied any worsening SOB, resolved chest pain, fever/chills.  Assessment/Plan: Principal Problem:   MSSA bacteremia Active Problems:   Sepsis affecting skin (HCC)   Pelvic pain   Cellulitis of left hand   Staphylococcus aureus bacteremia with sepsis (HCC)   Weakness of both lower extremities   Pain of right clavicle   IVDU (intravenous drug user)   Septic arthritis of right sternoclavicular joint (HCC)  Staphylococcus aureus bacteremia, complicated with sepsis Afebrile with leukocytosis BC positive with staph aureus, repeat shows no growth in 24 hours On IV Nafcillin MRI brain negative for any acute process ID on board  Tricuspid valve endocarditis with mod-severe TR Pt had TEE on 2/7/9 which showed tricuspid valve endocarditis with ?perforated leaflet, with mod-severe TR. A large PFO was also noted on bubble study ID on board Called consult to CTS Dr Tyrone Sage: Would not recommend surgery at this time. Close follow up with cardiology for development of CHF  Right sternoclavicular joint septic arthritis CT chest showed above after pt c/o R shoulder pain Consult to CTS as above  Acute on chronic anemia of iron deficiency Hgb 6.7, transfused 1U of PRBC on 02/20/17 Iron panel with iron 14, sats 9% Will transfuse if hgb <7 Continue iron  supplements and prophylactic bowel regimen  Urinary tract infection Completed antibiotic therapy  Hyperkalemia Resolved Daily BMET  Thrombocytopenia Resolved  IV drug abuse No signs of withdrawal, will continue neuro checks per unit protocol As needed alprazolam, continue suboxone.   Depression Continue cymbalta and olanzapine   Code Status: Full  Family Communication: None at bedside  Disposition Plan: Home   Consultants:  ID  CIR  CTS  Procedures:  None  Antimicrobials:  IV Nafcillin   DVT prophylaxis:  Lovenox   Objective: Vitals:   02/23/17 0556 02/23/17 1353 02/23/17 1358 02/23/17 1600  BP: 97/60 (!) 85/51 (!) 95/51 (!) 99/54  Pulse: 73 81    Resp: 18 18    Temp: 97.8 F (36.6 C) (!) 97.5 F (36.4 C)    TempSrc: Oral Oral    SpO2: 96% 97%    Weight:      Height:        Intake/Output Summary (Last 24 hours) at 02/23/2017 1731 Last data filed at 02/23/2017 1645 Gross per 24 hour  Intake 1320 ml  Output -  Net 1320 ml   Filed Weights   02/13/17 2120 02/17/17 1500  Weight: 76.5 kg (168 lb 10.4 oz) 80.7 kg (177 lb 14.6 oz)    Exam:   General:  Alert, awake, oriented, deconditioned  Cardiovascular: S1-S2 present, no added heart sounds  Respiratory: Chest clear bilaterally  Abdomen: Soft, nontender, nondistended, bowel sounds present  Musculoskeletal: No pedal edema bilaterally  Skin: Improved rash on the left hand  Psychiatry: Normal mood   Data Reviewed: CBC: Recent Labs  Lab 02/19/17 0352 02/20/17 0408 02/20/17 1305 02/21/17 8119 02/22/17 1478  02/23/17 0230  WBC 13.2* 11.8*  --  8.8 9.4 8.6  NEUTROABS 8.9* 7.9*  --  5.8 5.5 5.5  HGB 7.1* 6.7* 8.2* 7.5* 8.1* 7.9*  HCT 21.4* 19.7* 24.0* 22.9* 24.8* 24.3*  MCV 84.3 85.7  --  87.4 88.3 88.7  PLT 340 417*  --  387 364 413*   Basic Metabolic Panel: Recent Labs  Lab 02/19/17 0352  02/20/17 0408 02/20/17 1305 02/21/17 0652 02/22/17 0212 02/23/17 0230  NA  135  --  133* 136 133* 132* 132*  K 6.3*   < > 4.4 4.5 4.3 4.2 3.4*  CL 102  --  99*  --  99* 98* 98*  CO2 18*  --  21*  --  21* 21* 21*  GLUCOSE 74  --  87 83 89 90 122*  BUN 13  --  13  --  14 13 11   CREATININE 1.00  --  1.03*  --  0.83 0.74 0.86  CALCIUM 8.7*  --  8.5*  --  8.9 8.7* 8.6*   < > = values in this interval not displayed.   GFR: Estimated Creatinine Clearance: 102.6 mL/min (by C-G formula based on SCr of 0.86 mg/dL). Liver Function Tests: Recent Labs  Lab 02/18/17 0503  AST 50*  ALT 22  ALKPHOS 173*  BILITOT 1.4*  PROT 6.1*  ALBUMIN 1.6*   No results for input(s): LIPASE, AMYLASE in the last 168 hours. No results for input(s): AMMONIA in the last 168 hours. Coagulation Profile: No results for input(s): INR, PROTIME in the last 168 hours. Cardiac Enzymes: No results for input(s): CKTOTAL, CKMB, CKMBINDEX, TROPONINI in the last 168 hours. BNP (last 3 results) No results for input(s): PROBNP in the last 8760 hours. HbA1C: No results for input(s): HGBA1C in the last 72 hours. CBG: No results for input(s): GLUCAP in the last 168 hours. Lipid Profile: No results for input(s): CHOL, HDL, LDLCALC, TRIG, CHOLHDL, LDLDIRECT in the last 72 hours. Thyroid Function Tests: No results for input(s): TSH, T4TOTAL, FREET4, T3FREE, THYROIDAB in the last 72 hours. Anemia Panel: No results for input(s): VITAMINB12, FOLATE, FERRITIN, TIBC, IRON, RETICCTPCT in the last 72 hours. Urine analysis:    Component Value Date/Time   COLORURINE AMBER (A) 02/13/2017 0436   APPEARANCEUR HAZY (A) 02/13/2017 0436   LABSPEC 1.014 02/13/2017 0436   PHURINE 5.0 02/13/2017 0436   GLUCOSEU NEGATIVE 02/13/2017 0436   HGBUR LARGE (A) 02/13/2017 0436   BILIRUBINUR NEGATIVE 02/13/2017 0436   KETONESUR NEGATIVE 02/13/2017 0436   PROTEINUR NEGATIVE 02/13/2017 0436   UROBILINOGEN 0.2 12/07/2012 1510   NITRITE POSITIVE (A) 02/13/2017 0436   LEUKOCYTESUR TRACE (A) 02/13/2017 0436   Sepsis  Labs: @LABRCNTIP (procalcitonin:4,lacticidven:4)  ) Recent Results (from the past 240 hour(s))  MRSA PCR Screening     Status: None   Collection Time: 02/13/17  9:32 PM  Result Value Ref Range Status   MRSA by PCR NEGATIVE NEGATIVE Final    Comment:        The GeneXpert MRSA Assay (FDA approved for NASAL specimens only), is one component of a comprehensive MRSA colonization surveillance program. It is not intended to diagnose MRSA infection nor to guide or monitor treatment for MRSA infections.   Culture, blood (Routine X 2) w Reflex to ID Panel     Status: Abnormal   Collection Time: 02/14/17 10:07 AM  Result Value Ref Range Status   Specimen Description BLOOD RIGHT HAND  Final   Special Requests IN PEDIATRIC BOTTLE  Blood Culture adequate volume  Final   Culture  Setup Time   Final    GRAM POSITIVE COCCI IN PEDIATRIC BOTTLE CRITICAL VALUE NOTED.  VALUE IS CONSISTENT WITH PREVIOUSLY REPORTED AND CALLED VALUE.    Culture (A)  Final    STAPHYLOCOCCUS AUREUS SUSCEPTIBILITIES PERFORMED ON PREVIOUS CULTURE WITHIN THE LAST 5 DAYS. Performed at [facility], [address], [place], [place] [address]    Report Status 02/17/2017 FINAL  Final  Culture, blood (Routine X 2) w Reflex to ID Panel     Status: Abnormal   Collection Time: 02/17/17 10:10 AM  Result Value Ref Range Status   Specimen Description BLOOD RIGHT HAND  Final   Special Requests IN PEDIATRIC BOTTLE Blood Culture adequate volume  Final   Culture  Setup Time   Final    GRAM POSITIVE COCCI IN PEDIATRIC BOTTLE CRITICAL VALUE NOTED.  VALUE IS CONSISTENT WITH PREVIOUSLY REPORTED AND CALLED VALUE.    Culture (A)  Final    STAPHYLOCOCCUS AUREUS SUSCEPTIBILITIES PERFORMED ON PREVIOUS CULTURE WITHIN THE LAST 5 DAYS. Performed at [facility], [address] [place], [place] [address]    Report Status 02/19/2017 FINAL  Final  Culture, blood (Routine X 2) w Reflex to ID Panel     Status: None   Collection  Time: 02/17/17 10:15 AM  Result Value Ref Range Status   Specimen Description BLOOD LEFT ANTECUBITAL  Final   Special Requests IN PEDIATRIC BOTTLE Blood Culture adequate volume  Final   Culture   Final    NO GROWTH 5 DAYS Performed at [facility], [address], [place], [place] [address]    Report Status 02/22/2017 FINAL  Final  Culture, blood (Routine X 2) w Reflex to ID Panel     Status: None (Preliminary result)   Collection Time: 02/19/17  9:15 AM  Result Value Ref Range Status   Specimen Description BLOOD LEFT ANTECUBITAL  Final   Special Requests   Final    BOTTLES DRAWN AEROBIC AND ANAEROBIC Blood Culture adequate volume   Culture   Final    NO GROWTH 4 DAYS Performed at [facility], [address] [place], [place] [address]    Report Status PENDING  Incomplete  Culture, blood (Routine X 2) w Reflex to ID Panel     Status: None (Preliminary result)   Collection Time: 02/19/17  9:30 AM  Result Value Ref Range Status   Specimen Description BLOOD RIGHT ANTECUBITAL  Final   Special Requests   Final    BOTTLES DRAWN AEROBIC AND ANAEROBIC Blood Culture adequate volume   Culture   Final    NO GROWTH 4 DAYS Performed at [facility], [address], [place], [place] [address]    Report Status PENDING  Incomplete      Studies: Mr Laqueta Jean ZH Contrast  Result Date: 02/22/2017 CLINICAL DATA:  Weakness. Endocarditis, infective suspected. Fever and chills. EXAM: MRI HEAD WITHOUT AND WITH CONTRAST TECHNIQUE: Multiplanar, multiecho pulse sequences of the brain and surrounding structures were obtained without and with intravenous contrast. CONTRAST:  15mL MULTIHANCE GADOBENATE DIMEGLUMINE 529 MG/ML IV SOLN COMPARISON:  None. FINDINGS: Brain: No acute infarct, hemorrhage, or mass lesion is present. No focal white matter lesions are present. The postcontrast images demonstrate no pathologic enhancement. Is the internal auditory canals are within normal limits  bilaterally. The brainstem and cerebellum are normal. No significant extra-axial fluid collection is present. Vascular: Flow  is present in the major intracranial arteries. Skull and upper cervical spine: Skull base is within normal limits. The upper cervical spine is normal. The craniocervical junction is normal. Decreased T1 signal is compatible with known anemia. Sinuses/Orbits: The paranasal sinuses and mastoid air cells are clear. Globes and orbits are within normal limits. IMPRESSION: Negative MRI the brain. No acute or focal lesion to explain the patient's weakness. No evidence for infection. Electronically Signed   By: Marin Robertshristopher  Mattern M.D.   On: 02/22/2017 20:42   Dg Chest Port 1 View  Result Date: 02/22/2017 CLINICAL DATA:  Chest pain EXAM: PORTABLE CHEST 1 VIEW COMPARISON:  February 13, 2017 chest radiograph and chest CT February 20, 2017 FINDINGS: There is patchy airspace consolidation in the lung bases consistent with pneumonia. Lungs elsewhere are clear. Note that patchy nodular opacity seen on recent CT in the upper lobes are not appreciable by radiography. Heart is borderline enlarged with pulmonary vascularity within normal limits. No adenopathy appreciable by radiography. No bone lesions. IMPRESSION: Bibasilar airspace consolidation consistent with pneumonia. Nodular opacity seen in the upper lobe regions on recent CT are not appreciable by radiography. There is mild cardiomegaly. No adenopathy is appreciable by radiography. Electronically Signed   By: Bretta BangWilliam  Woodruff III M.D.   On: 02/22/2017 21:49    Scheduled Meds: . buprenorphine-naloxone  2 tablet Sublingual Daily  . DULoxetine  60 mg Oral BID  . enoxaparin (LOVENOX) injection  40 mg Subcutaneous Q24H  . feeding supplement (ENSURE ENLIVE)  237 mL Oral BID BM  . ferrous sulfate  325 mg Oral TID WC  . multivitamin with minerals  1 tablet Oral Daily  . OLANZapine  5 mg Oral q morning - 10a  . polyethylene glycol  17 g Oral Daily   . senna-docusate  1 tablet Oral BID    Continuous Infusions: . nafcillin IV Stopped (02/23/17 1714)     LOS: 10 days     Briant CedarNkeiruka J Yaremi Stahlman, MD [facility]  If 7PM-7AM, please contact night-coverage [email or web address] Password [email or web address] 02/23/2017, 5:31 PM

## 2017-02-24 DIAGNOSIS — Q2112 Patent foramen ovale: Secondary | ICD-10-CM

## 2017-02-24 DIAGNOSIS — I368 Other nonrheumatic tricuspid valve disorders: Secondary | ICD-10-CM

## 2017-02-24 DIAGNOSIS — I330 Acute and subacute infective endocarditis: Secondary | ICD-10-CM

## 2017-02-24 DIAGNOSIS — I079 Rheumatic tricuspid valve disease, unspecified: Secondary | ICD-10-CM | POA: Diagnosis present

## 2017-02-24 DIAGNOSIS — Q211 Atrial septal defect: Secondary | ICD-10-CM

## 2017-02-24 LAB — CBC WITH DIFFERENTIAL/PLATELET
Basophils Absolute: 0 10*3/uL (ref 0.0–0.1)
Basophils Relative: 0 %
Eosinophils Absolute: 0.1 10*3/uL (ref 0.0–0.7)
Eosinophils Relative: 2 %
HEMATOCRIT: 25.4 % — AB (ref 36.0–46.0)
HEMOGLOBIN: 8.2 g/dL — AB (ref 12.0–15.0)
LYMPHS ABS: 2.4 10*3/uL (ref 0.7–4.0)
LYMPHS PCT: 35 %
MCH: 28.5 pg (ref 26.0–34.0)
MCHC: 32.3 g/dL (ref 30.0–36.0)
MCV: 88.2 fL (ref 78.0–100.0)
Monocytes Absolute: 0.5 10*3/uL (ref 0.1–1.0)
Monocytes Relative: 7 %
NEUTROS ABS: 3.9 10*3/uL (ref 1.7–7.7)
Neutrophils Relative %: 56 %
Platelets: 488 10*3/uL — ABNORMAL HIGH (ref 150–400)
RBC: 2.88 MIL/uL — AB (ref 3.87–5.11)
RDW: 15.4 % (ref 11.5–15.5)
WBC: 6.9 10*3/uL (ref 4.0–10.5)

## 2017-02-24 LAB — BASIC METABOLIC PANEL
ANION GAP: 12 (ref 5–15)
BUN: 12 mg/dL (ref 6–20)
CHLORIDE: 99 mmol/L — AB (ref 101–111)
CO2: 24 mmol/L (ref 22–32)
Calcium: 8.8 mg/dL — ABNORMAL LOW (ref 8.9–10.3)
Creatinine, Ser: 0.87 mg/dL (ref 0.44–1.00)
GFR calc Af Amer: >60 mL/min (ref >60)
GFR calc non Af Amer: >60 mL/min (ref >60)
Glucose, Bld: 98 mg/dL (ref 65–99)
POTASSIUM: 3.7 mmol/L (ref 3.5–5.1)
Sodium: 135 mmol/L (ref 135–145)

## 2017-02-24 LAB — CULTURE, BLOOD (ROUTINE X 2)
Culture: NO GROWTH
Culture: NO GROWTH
Special Requests: ADEQUATE
Special Requests: ADEQUATE

## 2017-02-24 LAB — C-REACTIVE PROTEIN: CRP: 7.5 mg/dL — AB (ref <1.0)

## 2017-02-24 LAB — PROCALCITONIN: Procalcitonin: 0.17 ng/mL

## 2017-02-24 LAB — D-DIMER, QUANTITATIVE: D-Dimer, Quant: 5.72 ug/mL-FEU — ABNORMAL HIGH (ref 0.00–0.50)

## 2017-02-24 LAB — SEDIMENTATION RATE: SED RATE: 136 mm/h — AB (ref 0–22)

## 2017-02-24 MED ORDER — SODIUM CHLORIDE 0.9 % IV SOLN
2.0000 g | INTRAVENOUS | Status: DC
  Administered 2017-02-24 – 2017-03-19 (×132): 2 g via INTRAVENOUS
  Filled 2017-02-24 (×144): qty 2000

## 2017-02-24 NOTE — Plan of Care (Signed)
Progressing

## 2017-02-24 NOTE — Progress Notes (Signed)
Physical Therapy Treatment Patient Details Name: Tammy ArabCasey M [name] MRN: [id number] DOB: 09/10/1985 Today's Date: 02/24/2017    History of Present Illness Pt is a 32 y.o. female, with past medical history significant for fibromyalgia, anemia and history of IV drug abuse. She presented to the ED with a few days history of fever, chills, and generalized weakness muscle aches in addition to arthralgias.  She was admitted with diagnosis of sepsis.  Pt being treated for endocarditis.     PT Comments     Pt continues to make progress towards her goals, despite LE and groin pain with ambulation. Pt is mod I for bed mobility, supervision for transfers, and min guard for ambulation of 30 feet with RW, and minA for ambulation with HHA. Pt refuses to leave room so ambulation is back and forth in the room. D/c plans remain appropriate. PT will continue to follow acutely.   Follow Up Recommendations  Home health PT;Supervision/Assistance - 24 hour     Equipment Recommendations  Rolling walker with 5" wheels    Recommendations for Other Services       Precautions / Restrictions Precautions Precautions: Fall Restrictions Weight Bearing Restrictions: No    Mobility  Bed Mobility Overal bed mobility: Modified Independent Bed Mobility: Supine to Sit;Sit to Supine     Supine to sit: HOB elevated;Supervision Sit to supine: Min guard;HOB elevated   General bed mobility comments: pt sitting EOB  Transfers Overall transfer level: Needs assistance Equipment used: Rolling walker (2 wheeled) Transfers: Sit to/from Stand Sit to Stand: Min guard         General transfer comment: min guard for safety  Ambulation/Gait Ambulation/Gait assistance: Min guard;Min assist Ambulation Distance (Feet): 60 Feet Assistive device: Rolling walker (2 wheeled);1 person hand held assist Gait Pattern/deviations: Step-through pattern;Decreased stride length Gait velocity: decreased Gait velocity interpretation:  Below normal speed for age/gender General Gait Details: pt refused to ambulate in hallway so did laps in her room, started with 30 feet using RW but kept bumping into furniture, so switched to HHA and pt was able to ambulate another 30 feet with only mild instability       Balance Overall balance assessment: Needs assistance Sitting-balance support: No upper extremity supported;Feet supported Sitting balance-Leahy Scale: Good     Standing balance support: During functional activity Standing balance-Leahy Scale: Fair                              Cognition Arousal/Alertness: Awake/alert Behavior During Therapy: WFL for tasks assessed/performed Overall Cognitive Status: Within Functional Limits for tasks assessed                                               Pertinent Vitals/Pain Pain Assessment: Faces Faces Pain Scale: Hurts little more Pain Location: bilateral LEs Pain Descriptors / Indicators: Sore;Guarding;Grimacing Pain Intervention(s): Limited activity within patient's tolerance;Monitored during session;Repositioned       Prior Function            PT Goals (current goals can now be found in the care plan section) Acute Rehab PT Goals PT Goal Formulation: With patient Time For Goal Achievement: 03/03/17 Potential to Achieve Goals: Good    Frequency    Min 3X/week      PT Plan Current plan remains appropriate  AM-PAC PT "6 Clicks" Daily Activity  Outcome Measure  Difficulty turning over in bed (including adjusting bedclothes, sheets and blankets)?: None Difficulty moving from lying on back to sitting on the side of the bed? : A Little Difficulty sitting down on and standing up from a chair with arms (e.g., wheelchair, bedside commode, etc,.)?: Unable Help needed moving to and from a bed to chair (including a wheelchair)?: None Help needed walking in hospital room?: A Little Help needed climbing 3-5 steps with a railing?  : A Little 6 Click Score: 18    End of Session Equipment Utilized During Treatment: Gait belt Activity Tolerance: Patient limited by pain Patient left: in bed;with call bell/phone within reach Nurse Communication: Mobility status PT Visit Diagnosis: Muscle weakness (generalized) (M62.81);Difficulty in walking, not elsewhere classified (R26.2);Pain Pain - Right/Left: Right(bilateral) Pain - part of body: Leg(groin)     Time: 1210-1228 PT Time Calculation (min) (ACUTE ONLY): 18 min  Charges:  $Gait Training: 8-22 mins                    G Codes:       Kj Imbert B. Beverely Risen PT, DPT Acute Rehabilitation  [phone] Pager [phone]     [name] 02/24/2017, 1:07 PM

## 2017-02-24 NOTE — Progress Notes (Signed)
Subjective:  Patient is comfortable relative to last week when I saw her on Friday   Antibiotics:  Anti-infectives (From admission, onward)   Start     Dose/Rate Route Frequency Ordered Stop   02/24/17 1600  nafcillin 2 g in sodium chloride 0.9 % 100 mL IVPB     2 g 200 mL/hr over 30 Minutes Intravenous Every 4 hours 02/24/17 0833     02/21/17 2000  nafcillin 2 g in dextrose 5 % 100 mL IVPB  Status:  Discontinued     2 g 200 mL/hr over 30 Minutes Intravenous Every 4 hours 02/21/17 1741 02/24/17 0910   02/21/17 1730  nafcillin injection 2 g  Status:  Discontinued     2 g Intravenous Every 4 hours 02/21/17 1726 02/21/17 1740   02/19/17 1230  ceFAZolin (ANCEF) IVPB 2g/100 mL premix  Status:  Discontinued     2 g 200 mL/hr over 30 Minutes Intravenous Every 8 hours 02/19/17 1148 02/21/17 1726   02/18/17 1330  linezolid (ZYVOX) tablet 600 mg  Status:  Discontinued     600 mg Oral Every 12 hours 02/18/17 1250 02/19/17 1126   02/17/17 0800  sulfamethoxazole-trimethoprim (BACTRIM DS,SEPTRA DS) 800-160 MG per tablet 2 tablet  Status:  Discontinued     2 tablet Oral Every 12 hours 02/16/17 1453 02/18/17 1250   02/16/17 1600  sulfamethoxazole-trimethoprim (BACTRIM DS,SEPTRA DS) 800-160 MG per tablet 2 tablet     2 tablet Oral  Once 02/16/17 1509 02/16/17 1657   02/14/17 1400  ceFAZolin (ANCEF) IVPB 2g/100 mL premix  Status:  Discontinued     2 g 200 mL/hr over 30 Minutes Intravenous Every 8 hours 02/14/17 1013 02/16/17 1453   02/14/17 0200  vancomycin (VANCOCIN) IVPB 750 mg/150 ml premix  Status:  Discontinued     750 mg 150 mL/hr over 60 Minutes Intravenous Every 8 hours 02/13/17 1739 02/14/17 1017   02/14/17 0000  piperacillin-tazobactam (ZOSYN) IVPB 3.375 g  Status:  Discontinued     3.375 g 12.5 mL/hr over 240 Minutes Intravenous Every 8 hours 02/13/17 1739 02/14/17 1013   02/13/17 1715  piperacillin-tazobactam (ZOSYN) IVPB 3.375 g     3.375 g 100 mL/hr over 30 Minutes  Intravenous  Once 02/13/17 1701 02/13/17 1824   02/13/17 1715  vancomycin (VANCOCIN) IVPB 1000 mg/200 mL premix  Status:  Discontinued     1,000 mg 200 mL/hr over 60 Minutes Intravenous  Once 02/13/17 1701 02/13/17 1705   02/13/17 1715  vancomycin (VANCOCIN) 1,500 mg in sodium chloride 0.9 % 500 mL IVPB     1,500 mg 250 mL/hr over 120 Minutes Intravenous  Once 02/13/17 1705 02/13/17 2300      Medications: Scheduled Meds: . buprenorphine-naloxone  2 tablet Sublingual Daily  . DULoxetine  60 mg Oral BID  . enoxaparin (LOVENOX) injection  40 mg Subcutaneous Q24H  . feeding supplement (ENSURE ENLIVE)  237 mL Oral BID BM  . ferrous sulfate  325 mg Oral TID WC  . multivitamin with minerals  1 tablet Oral Daily  . OLANZapine  5 mg Oral q morning - 10a  . polyethylene glycol  17 g Oral Daily  . senna-docusate  1 tablet Oral BID   Continuous Infusions: . nafcillin IV Stopped (02/24/17 1601)   PRN Meds:.acetaminophen, ALPRAZolam, cyclobenzaprine, gadobenate dimeglumine, ondansetron **OR** ondansetron (ZOFRAN) IV    Objective: Weight change:   Intake/Output Summary (Last 24 hours) at 02/24/2017 1811 Last data filed at  02/24/2017 1300 Gross per 24 hour  Intake 660 ml  Output -  Net 660 ml   Blood pressure (!) 100/58, pulse 75, temperature 98.6 F (37 C), temperature source Oral, resp. rate 18, height 5\' 7"  (1.702 m), weight 177 lb 14.6 oz (80.7 kg), last menstrual period 01/28/2017, SpO2 97 %. Temp:  [98.2 F (36.8 C)-98.6 F (37 C)] 98.6 F (37 C) (02/11 1425) Pulse Rate:  [61-75] 75 (02/11 1425) Resp:  [18] 18 (02/11 1425) BP: (100-105)/(58-71) 100/58 (02/11 1425) SpO2:  [95 %-97 %] 97 % (02/11 1425)  Physical Exam: General: Alert and awake, oriented x3,and dysphoric HEENT: anicteric sclera,EOMI CVS regular rate, normal r,  no murmur rubs or gallops Chest: clear to auscultation bilaterally, no wheezing, rales or rhonchi Abdomen: soft nontender, nondistended, normal bowel  sounds, Extremities: she has minimal tenderness of clavicle Skin: rashes resolving on feet  Neuro: she does still have weakness in lower extremities  CBC:  CBC Latest Ref Rng & Units 02/24/2017 02/23/2017 02/22/2017  WBC 4.0 - 10.5 K/uL 6.9 8.6 9.4  Hemoglobin 12.0 - 15.0 g/dL 8.2(L) 7.9(L) 8.1(L)  Hematocrit 36.0 - 46.0 % 25.4(L) 24.3(L) 24.8(L)  Platelets 150 - 400 K/uL 488(H) 413(H) 364      BMET Recent Labs    02/23/17 0230 02/24/17 0406  NA 132* 135  K 3.4* 3.7  CL 98* 99*  CO2 21* 24  GLUCOSE 122* 98  BUN 11 12  CREATININE 0.86 0.87  CALCIUM 8.6* 8.8*     Liver Panel  No results for input(s): PROT, ALBUMIN, AST, ALT, ALKPHOS, BILITOT, BILIDIR, IBILI in the last 72 hours.     Sedimentation Rate No results for input(s): ESRSEDRATE in the last 72 hours. C-Reactive Protein Recent Labs    02/24/17 1209  CRP 7.5*    Micro Results: Recent Results (from the past 720 hour(s))  Culture, blood (Routine x 2)     Status: Abnormal   Collection Time: 02/13/17  4:00 PM  Result Value Ref Range Status   Specimen Description BLOOD RIGHT ANTECUBITAL  Final   Special Requests IN PEDIATRIC BOTTLE Blood Culture adequate volume  Final   Culture  Setup Time   Final    GRAM POSITIVE COCCI IN PEDIATRIC BOTTLE CRITICAL RESULT CALLED TO, READ BACK BY AND VERIFIED WITHMelven Sartorius: [facility] [id number] 02/14/17 A BROWNING Performed at [facility], [address] [place], [place] [address]    Culture STAPHYLOCOCCUS AUREUS (A)  Final   Report Status 02/17/2017 FINAL  Final   Organism ID, Bacteria STAPHYLOCOCCUS AUREUS  Final      Susceptibility   Staphylococcus aureus - MIC*    CIPROFLOXACIN <=0.5 SENSITIVE Sensitive     ERYTHROMYCIN <=0.25 SENSITIVE Sensitive     GENTAMICIN <=0.5 SENSITIVE Sensitive     OXACILLIN <=0.25 SENSITIVE Sensitive     TETRACYCLINE >=16 RESISTANT Resistant     VANCOMYCIN 1 SENSITIVE Sensitive     TRIMETH/SULFA <=10 SENSITIVE Sensitive     CLINDAMYCIN  <=0.25 SENSITIVE Sensitive     RIFAMPIN <=0.5 SENSITIVE Sensitive     Inducible Clindamycin NEGATIVE Sensitive     * STAPHYLOCOCCUS AUREUS  Blood Culture ID Panel (Reflexed)     Status: Abnormal   Collection Time: 02/13/17  4:00 PM  Result Value Ref Range Status   Enterococcus species NOT DETECTED NOT DETECTED Final   Listeria monocytogenes NOT DETECTED NOT DETECTED Final   Staphylococcus species DETECTED (A) NOT DETECTED Final    Comment: CRITICAL RESULT  CALLED TO, READ BACK BY AND VERIFIED WITH: Melven Sartorius PHARMD [id number] 02/14/17 A BROWNING    Staphylococcus aureus DETECTED (A) NOT DETECTED Final    Comment: Methicillin (oxacillin) susceptible Staphylococcus aureus (MSSA). Preferred therapy is anti staphylococcal beta lactam antibiotic (Cefazolin or Nafcillin), unless clinically contraindicated. CRITICAL RESULT CALLED TO, READ BACK BY AND VERIFIED WITH: Melven Sartorius PHARMD [id number] 02/14/17 A BROWNING    Methicillin resistance NOT DETECTED NOT DETECTED Final   Streptococcus species NOT DETECTED NOT DETECTED Final   Streptococcus agalactiae NOT DETECTED NOT DETECTED Final   Streptococcus pneumoniae NOT DETECTED NOT DETECTED Final   Streptococcus pyogenes NOT DETECTED NOT DETECTED Final   Acinetobacter baumannii NOT DETECTED NOT DETECTED Final   Enterobacteriaceae species NOT DETECTED NOT DETECTED Final   Enterobacter cloacae complex NOT DETECTED NOT DETECTED Final   Escherichia coli NOT DETECTED NOT DETECTED Final   Klebsiella oxytoca NOT DETECTED NOT DETECTED Final   Klebsiella pneumoniae NOT DETECTED NOT DETECTED Final   Proteus species NOT DETECTED NOT DETECTED Final   Serratia marcescens NOT DETECTED NOT DETECTED Final   Haemophilus influenzae NOT DETECTED NOT DETECTED Final   Neisseria meningitidis NOT DETECTED NOT DETECTED Final   Pseudomonas aeruginosa NOT DETECTED NOT DETECTED Final   Candida albicans NOT DETECTED NOT DETECTED Final   Candida glabrata NOT DETECTED NOT DETECTED Final    Candida krusei NOT DETECTED NOT DETECTED Final   Candida parapsilosis NOT DETECTED NOT DETECTED Final   Candida tropicalis NOT DETECTED NOT DETECTED Final    Comment: Performed at [facility], [address] [place], [place] [address]  Culture, blood (Routine x 2)     Status: Abnormal   Collection Time: 02/13/17  5:00 PM  Result Value Ref Range Status   Specimen Description BLOOD RIGHT ANTECUBITAL  Final   Special Requests   Final    BOTTLES DRAWN AEROBIC AND ANAEROBIC Blood Culture adequate volume   Culture  Setup Time   Final    GRAM POSITIVE COCCI IN CLUSTERS IN BOTH AEROBIC AND ANAEROBIC BOTTLES CRITICAL VALUE NOTED.  VALUE IS CONSISTENT WITH PREVIOUSLY REPORTED AND CALLED VALUE.    Culture (A)  Final    STAPHYLOCOCCUS AUREUS SUSCEPTIBILITIES PERFORMED ON PREVIOUS CULTURE WITHIN THE LAST 5 DAYS. Performed at [facility], [address] [place], [place] [address]    Report Status 02/16/2017 FINAL  Final  MRSA PCR Screening     Status: None   Collection Time: 02/13/17  9:32 PM  Result Value Ref Range Status   MRSA by PCR NEGATIVE NEGATIVE Final    Comment:        The GeneXpert MRSA Assay (FDA approved for NASAL specimens only), is one component of a comprehensive MRSA colonization surveillance program. It is not intended to diagnose MRSA infection nor to guide or monitor treatment for MRSA infections.   Culture, blood (Routine X 2) w Reflex to ID Panel     Status: Abnormal   Collection Time: 02/14/17 10:07 AM  Result Value Ref Range Status   Specimen Description BLOOD RIGHT HAND  Final   Special Requests IN PEDIATRIC BOTTLE Blood Culture adequate volume  Final   Culture  Setup Time   Final    GRAM POSITIVE COCCI IN PEDIATRIC BOTTLE CRITICAL VALUE NOTED.  VALUE IS CONSISTENT WITH PREVIOUSLY REPORTED AND CALLED VALUE.    Culture (A)  Final    STAPHYLOCOCCUS AUREUS SUSCEPTIBILITIES PERFORMED ON PREVIOUS CULTURE WITHIN THE LAST 5 DAYS. Performed at [facility], [address] [place], [place] [address]    Report Status 02/17/2017 FINAL  Final  Culture, blood (Routine X 2) w Reflex to ID Panel     Status: Abnormal   Collection Time: 02/17/17 10:10 AM  Result Value Ref Range Status   Specimen Description BLOOD RIGHT HAND  Final   Special Requests IN PEDIATRIC BOTTLE Blood Culture adequate volume  Final   Culture  Setup Time   Final    GRAM POSITIVE COCCI IN PEDIATRIC BOTTLE CRITICAL VALUE NOTED.  VALUE IS CONSISTENT WITH PREVIOUSLY REPORTED AND CALLED VALUE.    Culture (A)  Final    STAPHYLOCOCCUS AUREUS SUSCEPTIBILITIES PERFORMED ON PREVIOUS CULTURE WITHIN THE LAST 5 DAYS. Performed at [facility], [address], [place], [place] [address]    Report Status 02/19/2017 FINAL  Final  Culture, blood (Routine X 2) w Reflex to ID Panel     Status: None   Collection Time: 02/17/17 10:15 AM  Result Value Ref Range Status   Specimen Description BLOOD LEFT ANTECUBITAL  Final   Special Requests IN PEDIATRIC BOTTLE Blood Culture adequate volume  Final   Culture   Final    NO GROWTH 5 DAYS Performed at [facility], [address], [place], [place] [address]    Report Status 02/22/2017 FINAL  Final  Culture, blood (Routine X 2) w Reflex to ID Panel     Status: None   Collection Time: 02/19/17  9:15 AM  Result Value Ref Range Status   Specimen Description BLOOD LEFT ANTECUBITAL  Final   Special Requests   Final    BOTTLES DRAWN AEROBIC AND ANAEROBIC Blood Culture adequate volume   Culture   Final    NO GROWTH 5 DAYS Performed at [facility], [address], [place], [place] [address]    Report Status 02/24/2017 FINAL  Final  Culture, blood (Routine X 2) w Reflex to ID Panel     Status: None   Collection Time: 02/19/17  9:30 AM  Result Value Ref Range Status   Specimen Description BLOOD RIGHT ANTECUBITAL  Final   Special Requests   Final    BOTTLES DRAWN AEROBIC AND ANAEROBIC Blood Culture adequate volume    Culture   Final    NO GROWTH 5 DAYS Performed at [facility], [address], [place], [place] [address]    Report Status 02/24/2017 FINAL  Final    Studies/Results: Mr Laqueta Jean EX Contrast  Result Date: 02/22/2017 CLINICAL DATA:  Weakness. Endocarditis, infective suspected. Fever and chills. EXAM: MRI HEAD WITHOUT AND WITH CONTRAST TECHNIQUE: Multiplanar, multiecho pulse sequences of the brain and surrounding structures were obtained without and with intravenous contrast. CONTRAST:  15mL MULTIHANCE GADOBENATE DIMEGLUMINE 529 MG/ML IV SOLN COMPARISON:  None. FINDINGS: Brain: No acute infarct, hemorrhage, or mass lesion is present. No focal white matter lesions are present. The postcontrast images demonstrate no pathologic enhancement. Is the internal auditory canals are within normal limits bilaterally. The brainstem and cerebellum are normal. No significant extra-axial fluid collection is present. Vascular: Flow is present in the major intracranial arteries. Skull and upper cervical spine: Skull base is within normal limits. The upper cervical spine is normal. The craniocervical junction is normal. Decreased T1 signal is compatible with known anemia. Sinuses/Orbits: The paranasal sinuses and mastoid air cells are clear. Globes and orbits are within normal limits. IMPRESSION: Negative MRI the brain. No acute or focal lesion to explain the patient's  weakness. No evidence for infection. Electronically Signed   By: Marin Roberts M.D.   On: 02/22/2017 20:42   Dg Chest Port 1 View  Result Date: 02/22/2017 CLINICAL DATA:  Chest pain EXAM: PORTABLE CHEST 1 VIEW COMPARISON:  February 13, 2017 chest radiograph and chest CT February 20, 2017 FINDINGS: There is patchy airspace consolidation in the lung bases consistent with pneumonia. Lungs elsewhere are clear. Note that patchy nodular opacity seen on recent CT in the upper lobes are not appreciable by radiography. Heart is borderline enlarged with  pulmonary vascularity within normal limits. No adenopathy appreciable by radiography. No bone lesions. IMPRESSION: Bibasilar airspace consolidation consistent with pneumonia. Nodular opacity seen in the upper lobe regions on recent CT are not appreciable by radiography. There is mild cardiomegaly. No adenopathy is appreciable by radiography. Electronically Signed   By: Bretta Bang III M.D.   On: 02/22/2017 21:49      Assessment/Plan:  INTERVAL HISTORY:   MRI showed no evidence of septic emboli  CVTS did not feel appropriate for surgery   Principal Problem:   MSSA bacteremia Active Problems:   Sepsis affecting skin (HCC)   Pelvic pain   Cellulitis of left hand   Staphylococcus aureus bacteremia with sepsis (HCC)   Weakness of both lower extremities   Pain of right clavicle   IVDU (intravenous drug user)   Septic arthritis of right sternoclavicular joint (HCC)    HARLYNN KIMBELL is a 32 y.o. 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  Specific aspects of clinical management are critical to optimizing the outcome of patients with SAB.  Therefore, the [facility] Antimicrobial Management Team [facility]) has initiated an intervention aimed at improving the management of SAB at [facility].  To do so, Infectious Diseases physicians are providing an evidence-based consult for the management of all patients with SAB.     Yes No Comments  Perform follow-up blood cultures (even if the patient is afebrile) to ensure clearance of bacteremia [x]  []  02/19/17 culture NO GROWTH x 5 days  Remove vascular catheter and obtain follow-up blood cultures after the removal of the catheter []  []   Would not place PICC until DISPO is clear  Perform echocardiography to evaluate for endocarditis (transthoracic ECHO is 40-50% sensitive, TEE is > 90% sensitive) []  []  Please keep in mind, that neither test can definitively EXCLUDE endocarditis, and that should clinical suspicion remain high for endocarditis the patient should then still be treated with an "endocarditis" duration of therapy = 6 weeks  She has vegetation with possible perforation on TV w mod to severe TR and PFO  Consult electrophysiologist to evaluate implanted cardiac device (pacemaker, ICD) []  []    Ensure source control []  []  Have all abscesses been drained effectively? Have deep seeded infections (septic joints or osteomyelitis) had appropriate surgical debridement?  SHE HAS BEEN SEEN BY CT SURGERY  Investigate for "metastatic" sites of infection []  []  Does the patient have ANY symptom or physical exam finding that would suggest a deeper infection (back or neck pain that may be suggestive of vertebral osteomyelitis or epidural abscess, muscle pain that could be a symptom of pyomyositis)?  Keep in mind that for deep seeded  infections MRI imaging with contrast is preferred rather than other often insensitive tests such as plain x-rays, especially early in a patient's presentation.   MRI brain was without septic emboli  Changed antibiotic therapy to NAFCILLIN GIVEN PFO AND CONCERN ABOUT SEPTIC EMBOLI TO CNS I will change her back to cefazolin tomorrow []  []  Beta-lactam antibiotics are preferred for MSSA due to higher cure rates.   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 Dr. Tyrone Sage does not recommend TVR in context of her ongoing drug use. Family is unhappy with this but this is a very difficult clinical situation in which Dr. Tyrone Sage has to weigh risks/benefits.   I do think it is possible patient might be able to be treated with IV antibiotics and get effective treatment. Certainly her blood cultures have sterilized. Now I cannot guarantee nor can anyone that the vegetation might not embolize across PFO into left sided circulation even if it is sterilized  If surgery is not pursued would recommend repeat TTE and likely repeat TEE to assess vegetation.   Patients family had been requesting transfer to [facility].  Regardless of where she might be the risk of her doing further IVDU and infected a new prosthetic valve will remain and need to be balanced vs urgency of doing surgery in current situation with large right sided vegetation with PFO    #3 [place] septic arthritis:  I discussed with Dr. Tyrone Sage who was not impressed with ehr [place] joint and did not want to intercede. 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 Criselda Peaches to see her tomorrow   I spent greater than 35 minutes with the patient including greater than 50% of time in face to face counsel of the patient and step father re risk benefit calculations being entertained re surgery or no surgery, re choice of antimicrobials, monitoring and in coordination of her care w CT surgery and primary team.    LOS: 11 days   Acey Lav 02/24/2017, 6:11 PM

## 2017-02-24 NOTE — Progress Notes (Signed)
PROGRESS NOTE  [name]:[id number] DOB: 12-Apr-1985 DOA: 02/13/2017 PCP: Pearson Grippe, MD  HPI/Recap of past 45 hours: 32 year old female who presented with a chief complaint weakness.  She does have a significant past medical history for fibromyalgia, chronic anemia and history intravenous drug abuse. Complaint of fever, chills, generalized weakness for the last 2 days prior to hospitalization.  She had erythema and petechiae on her left hand and bilateral lower extremities. Patient was admitted to the hospital with diagnosis of sepsis, due to left hand cellulitis, rule out endocarditis.  Today, patient still c/o intermittent pleuritic chest pain, with some dyspnea. Pt denies any other symptoms, no fever/chills  Assessment/Plan: Principal Problem:   MSSA bacteremia Active Problems:   Sepsis affecting skin (HCC)   Pelvic pain   Cellulitis of left hand   Staphylococcus aureus bacteremia with sepsis (HCC)   Weakness of both lower extremities   Pain of right clavicle   IVDU (intravenous drug user)   Septic arthritis of right sternoclavicular joint (HCC)  Staphylococcus aureus bacteremia, complicated with sepsis Afebrile with leukocytosis BC positive with staph aureus, repeat shows no growth till date On IV Nafcillin MRI brain negative for any acute process ID on board  Tricuspid valve endocarditis with mod-severe TR Pt had TEE on 2/7/9 which showed tricuspid valve endocarditis with ?perforated leaflet, with mod-severe TR. A large PFO was also noted on bubble study ID on board Called consult to CTS Dr Tyrone Sage: Would not recommend surgery at this time. Close follow up with cardiology for development of CHF  Right sternoclavicular joint septic arthritis CT chest showed above after pt c/o R shoulder pain Consult to CTS as above  Pleuritic chest pain Associated with dyspnea D-dimer elevated, elevated CRP, ??likely due to bacteremia/endocarditis CT angio pending to r/o  PE  Acute on chronic anemia of iron deficiency Hgb 6.7, transfused 1U of PRBC on 02/20/17 Iron panel with iron 14, sats 9% Will transfuse if hgb <7 Continue iron supplements and prophylactic bowel regimen  Urinary tract infection Completed antibiotic therapy  Hyperkalemia Resolved Daily BMET  Thrombocytopenia Resolved  IV drug abuse No signs of withdrawal, will continue neuro checks per unit protocol As needed alprazolam, continue suboxone.   Depression Continue cymbalta and olanzapine   Code Status: Full  Family Communication: Mum at bedside  Disposition Plan: TBD   Consultants:  ID  CIR  CTS  Procedures:  None  Antimicrobials:  IV Nafcillin   DVT prophylaxis:  Lovenox   Objective: Vitals:   02/23/17 1600 02/23/17 2130 02/24/17 0500 02/24/17 1425  BP: (!) 99/54 102/71 105/65 (!) 100/58  Pulse:  61 75 75  Resp:  18 18 18   Temp:  98.2 F (36.8 C) 98.2 F (36.8 C) 98.6 F (37 C)  TempSrc:  Oral Oral Oral  SpO2:  97% 95% 97%  Weight:      Height:        Intake/Output Summary (Last 24 hours) at 02/24/2017 1711 Last data filed at 02/24/2017 1300 Gross per 24 hour  Intake 660 ml  Output -  Net 660 ml   Filed Weights   02/13/17 2120 02/17/17 1500  Weight: 76.5 kg (168 lb 10.4 oz) 80.7 kg (177 lb 14.6 oz)    Exam:   General:  Alert, awake, oriented, deconditioned  Cardiovascular: S1-S2 present, no added heart sounds  Respiratory: Chest clear bilaterally  Abdomen: Soft, nontender, nondistended, bowel sounds present  Musculoskeletal: No pedal edema bilaterally  Skin: Improved rash on the  left hand  Psychiatry: Normal mood   Data Reviewed: CBC: Recent Labs  Lab 02/20/17 0408 02/20/17 1305 02/21/17 0652 02/22/17 0212 02/23/17 0230 02/24/17 0406  WBC 11.8*  --  8.8 9.4 8.6 6.9  NEUTROABS 7.9*  --  5.8 5.5 5.5 3.9  HGB 6.7* 8.2* 7.5* 8.1* 7.9* 8.2*  HCT 19.7* 24.0* 22.9* 24.8* 24.3* 25.4*  MCV 85.7  --  87.4 88.3 88.7  88.2  PLT 417*  --  387 364 413* 488*   Basic Metabolic Panel: Recent Labs  Lab 02/20/17 0408 02/20/17 1305 02/21/17 0652 02/22/17 0212 02/23/17 0230 02/24/17 0406  NA 133* 136 133* 132* 132* 135  K 4.4 4.5 4.3 4.2 3.4* 3.7  CL 99*  --  99* 98* 98* 99*  CO2 21*  --  21* 21* 21* 24  GLUCOSE 87 83 89 90 122* 98  BUN 13  --  14 13 11 12   CREATININE 1.03*  --  0.83 0.74 0.86 0.87  CALCIUM 8.5*  --  8.9 8.7* 8.6* 8.8*   GFR: Estimated Creatinine Clearance: 101.4 mL/min (by C-G formula based on SCr of 0.87 mg/dL). Liver Function Tests: Recent Labs  Lab 02/18/17 0503  AST 50*  ALT 22  ALKPHOS 173*  BILITOT 1.4*  PROT 6.1*  ALBUMIN 1.6*   No results for input(s): LIPASE, AMYLASE in the last 168 hours. No results for input(s): AMMONIA in the last 168 hours. Coagulation Profile: No results for input(s): INR, PROTIME in the last 168 hours. Cardiac Enzymes: No results for input(s): CKTOTAL, CKMB, CKMBINDEX, TROPONINI in the last 168 hours. BNP (last 3 results) No results for input(s): PROBNP in the last 8760 hours. HbA1C: No results for input(s): HGBA1C in the last 72 hours. CBG: No results for input(s): GLUCAP in the last 168 hours. Lipid Profile: No results for input(s): CHOL, HDL, LDLCALC, TRIG, CHOLHDL, LDLDIRECT in the last 72 hours. Thyroid Function Tests: No results for input(s): TSH, T4TOTAL, FREET4, T3FREE, THYROIDAB in the last 72 hours. Anemia Panel: No results for input(s): VITAMINB12, FOLATE, FERRITIN, TIBC, IRON, RETICCTPCT in the last 72 hours. Urine analysis:    Component Value Date/Time   COLORURINE AMBER (A) 02/13/2017 0436   APPEARANCEUR HAZY (A) 02/13/2017 0436   LABSPEC 1.014 02/13/2017 0436   PHURINE 5.0 02/13/2017 0436   GLUCOSEU NEGATIVE 02/13/2017 0436   HGBUR LARGE (A) 02/13/2017 0436   BILIRUBINUR NEGATIVE 02/13/2017 0436   KETONESUR NEGATIVE 02/13/2017 0436   PROTEINUR NEGATIVE 02/13/2017 0436   UROBILINOGEN 0.2 12/07/2012 1510    NITRITE POSITIVE (A) 02/13/2017 0436   LEUKOCYTESUR TRACE (A) 02/13/2017 0436   Sepsis Labs: @LABRCNTIP (procalcitonin:4,lacticidven:4)  ) Recent Results (from the past 240 hour(s))  Culture, blood (Routine X 2) w Reflex to ID Panel     Status: Abnormal   Collection Time: 02/17/17 10:10 AM  Result Value Ref Range Status   Specimen Description BLOOD RIGHT HAND  Final   Special Requests IN PEDIATRIC BOTTLE Blood Culture adequate volume  Final   Culture  Setup Time   Final    GRAM POSITIVE COCCI IN PEDIATRIC BOTTLE CRITICAL VALUE NOTED.  VALUE IS CONSISTENT WITH PREVIOUSLY REPORTED AND CALLED VALUE.    Culture (A)  Final    STAPHYLOCOCCUS AUREUS SUSCEPTIBILITIES PERFORMED ON PREVIOUS CULTURE WITHIN THE LAST 5 DAYS. Performed at [facility], [address], [place], [place] [address]    Report Status 02/19/2017 FINAL  Final  Culture, blood (Routine X 2) w Reflex to ID Panel  Status: None   Collection Time: 02/17/17 10:15 AM  Result Value Ref Range Status   Specimen Description BLOOD LEFT ANTECUBITAL  Final   Special Requests IN PEDIATRIC BOTTLE Blood Culture adequate volume  Final   Culture   Final    NO GROWTH 5 DAYS Performed at [facility], [address] [place], [place] [address]    Report Status 02/22/2017 FINAL  Final  Culture, blood (Routine X 2) w Reflex to ID Panel     Status: None   Collection Time: 02/19/17  9:15 AM  Result Value Ref Range Status   Specimen Description BLOOD LEFT ANTECUBITAL  Final   Special Requests   Final    BOTTLES DRAWN AEROBIC AND ANAEROBIC Blood Culture adequate volume   Culture   Final    NO GROWTH 5 DAYS Performed at [facility], [address] [place], [place] [address]    Report Status 02/24/2017 FINAL  Final  Culture, blood (Routine X 2) w Reflex to ID Panel     Status: None   Collection Time: 02/19/17  9:30 AM  Result Value Ref Range Status   Specimen Description BLOOD RIGHT ANTECUBITAL  Final   Special  Requests   Final    BOTTLES DRAWN AEROBIC AND ANAEROBIC Blood Culture adequate volume   Culture   Final    NO GROWTH 5 DAYS Performed at [facility], [address] [place], [place] [address]    Report Status 02/24/2017 FINAL  Final      Studies: No results found.  Scheduled Meds: . buprenorphine-naloxone  2 tablet Sublingual Daily  . DULoxetine  60 mg Oral BID  . enoxaparin (LOVENOX) injection  40 mg Subcutaneous Q24H  . feeding supplement (ENSURE ENLIVE)  237 mL Oral BID BM  . ferrous sulfate  325 mg Oral TID WC  . multivitamin with minerals  1 tablet Oral Daily  . OLANZapine  5 mg Oral q morning - 10a  . polyethylene glycol  17 g Oral Daily  . senna-docusate  1 tablet Oral BID    Continuous Infusions: . nafcillin IV Stopped (02/24/17 1601)     LOS: 11 days     Briant CedarNkeiruka J Cleatis Fandrich, MD [facility]  If 7PM-7AM, please contact night-coverage [email or web address] Password [email or web address] 02/24/2017, 5:11 PM

## 2017-02-25 ENCOUNTER — Inpatient Hospital Stay (HOSPITAL_COMMUNITY): Payer: Medicaid Other

## 2017-02-25 DIAGNOSIS — A419 Sepsis, unspecified organism: Secondary | ICD-10-CM

## 2017-02-25 LAB — BASIC METABOLIC PANEL
Anion gap: 12 (ref 5–15)
BUN: 11 mg/dL (ref 6–20)
CALCIUM: 8.7 mg/dL — AB (ref 8.9–10.3)
CO2: 25 mmol/L (ref 22–32)
Chloride: 98 mmol/L — ABNORMAL LOW (ref 101–111)
Creatinine, Ser: 0.77 mg/dL (ref 0.44–1.00)
GFR calc Af Amer: >60 mL/min (ref >60)
GLUCOSE: 87 mg/dL (ref 65–99)
POTASSIUM: 3.9 mmol/L (ref 3.5–5.1)
Sodium: 135 mmol/L (ref 135–145)

## 2017-02-25 LAB — CBC WITH DIFFERENTIAL/PLATELET
Basophils Absolute: 0 10*3/uL (ref 0.0–0.1)
Basophils Relative: 0 %
EOS PCT: 2 %
Eosinophils Absolute: 0.2 10*3/uL (ref 0.0–0.7)
HCT: 25.2 % — ABNORMAL LOW (ref 36.0–46.0)
Hemoglobin: 8 g/dL — ABNORMAL LOW (ref 12.0–15.0)
LYMPHS ABS: 2.4 10*3/uL (ref 0.7–4.0)
LYMPHS PCT: 28 %
MCH: 28.3 pg (ref 26.0–34.0)
MCHC: 31.7 g/dL (ref 30.0–36.0)
MCV: 89 fL (ref 78.0–100.0)
MONOS PCT: 7 %
Monocytes Absolute: 0.6 10*3/uL (ref 0.1–1.0)
Neutro Abs: 5.3 10*3/uL (ref 1.7–7.7)
Neutrophils Relative %: 63 %
PLATELETS: 524 10*3/uL — AB (ref 150–400)
RBC: 2.83 MIL/uL — ABNORMAL LOW (ref 3.87–5.11)
RDW: 15.5 % (ref 11.5–15.5)
WBC: 8.4 10*3/uL (ref 4.0–10.5)

## 2017-02-25 MED ORDER — IOPAMIDOL (ISOVUE-370) INJECTION 76%
INTRAVENOUS | Status: AC
  Administered 2017-02-25: 100 mL
  Filled 2017-02-25: qty 100

## 2017-02-25 MED ORDER — SODIUM CHLORIDE 0.9% FLUSH
10.0000 mL | INTRAVENOUS | Status: DC | PRN
  Administered 2017-02-25: 20 mL
  Administered 2017-02-27: 10 mL
  Filled 2017-02-25 (×2): qty 40

## 2017-02-25 NOTE — Progress Notes (Signed)
Subjective:  No new complaints   Antibiotics:  Anti-infectives (From admission, onward)   Start     Dose/Rate Route Frequency Ordered Stop   02/24/17 1600  nafcillin 2 g in sodium chloride 0.9 % 100 mL IVPB     2 g 200 mL/hr over 30 Minutes Intravenous Every 4 hours 02/24/17 0833     02/21/17 2000  nafcillin 2 g in dextrose 5 % 100 mL IVPB  Status:  Discontinued     2 g 200 mL/hr over 30 Minutes Intravenous Every 4 hours 02/21/17 1741 02/24/17 0910   02/21/17 1730  nafcillin injection 2 g  Status:  Discontinued     2 g Intravenous Every 4 hours 02/21/17 1726 02/21/17 1740   02/19/17 1230  ceFAZolin (ANCEF) IVPB 2g/100 mL premix  Status:  Discontinued     2 g 200 mL/hr over 30 Minutes Intravenous Every 8 hours 02/19/17 1148 02/21/17 1726   02/18/17 1330  linezolid (ZYVOX) tablet 600 mg  Status:  Discontinued     600 mg Oral Every 12 hours 02/18/17 1250 02/19/17 1126   02/17/17 0800  sulfamethoxazole-trimethoprim (BACTRIM DS,SEPTRA DS) 800-160 MG per tablet 2 tablet  Status:  Discontinued     2 tablet Oral Every 12 hours 02/16/17 1453 02/18/17 1250   02/16/17 1600  sulfamethoxazole-trimethoprim (BACTRIM DS,SEPTRA DS) 800-160 MG per tablet 2 tablet     2 tablet Oral  Once 02/16/17 1509 02/16/17 1657   02/14/17 1400  ceFAZolin (ANCEF) IVPB 2g/100 mL premix  Status:  Discontinued     2 g 200 mL/hr over 30 Minutes Intravenous Every 8 hours 02/14/17 1013 02/16/17 1453   02/14/17 0200  vancomycin (VANCOCIN) IVPB 750 mg/150 ml premix  Status:  Discontinued     750 mg 150 mL/hr over 60 Minutes Intravenous Every 8 hours 02/13/17 1739 02/14/17 1017   02/14/17 0000  piperacillin-tazobactam (ZOSYN) IVPB 3.375 g  Status:  Discontinued     3.375 g 12.5 mL/hr over 240 Minutes Intravenous Every 8 hours 02/13/17 1739 02/14/17 1013   02/13/17 1715  piperacillin-tazobactam (ZOSYN) IVPB 3.375 g     3.375 g 100 mL/hr over 30 Minutes Intravenous  Once 02/13/17 1701 02/13/17 1824   02/13/17  1715  vancomycin (VANCOCIN) IVPB 1000 mg/200 mL premix  Status:  Discontinued     1,000 mg 200 mL/hr over 60 Minutes Intravenous  Once 02/13/17 1701 02/13/17 1705   02/13/17 1715  vancomycin (VANCOCIN) 1,500 mg in sodium chloride 0.9 % 500 mL IVPB     1,500 mg 250 mL/hr over 120 Minutes Intravenous  Once 02/13/17 1705 02/13/17 2300      Medications: Scheduled Meds: . buprenorphine-naloxone  2 tablet Sublingual Daily  . DULoxetine  60 mg Oral BID  . enoxaparin (LOVENOX) injection  40 mg Subcutaneous Q24H  . feeding supplement (ENSURE ENLIVE)  237 mL Oral BID BM  . ferrous sulfate  325 mg Oral TID WC  . multivitamin with minerals  1 tablet Oral Daily  . OLANZapine  5 mg Oral q morning - 10a  . polyethylene glycol  17 g Oral Daily  . senna-docusate  1 tablet Oral BID   Continuous Infusions: . nafcillin IV Stopped (02/25/17 1604)   PRN Meds:.acetaminophen, ALPRAZolam, cyclobenzaprine, gadobenate dimeglumine, ondansetron **OR** ondansetron (ZOFRAN) IV, sodium chloride flush    Objective: Weight change:   Intake/Output Summary (Last 24 hours) at 02/25/2017 1639 Last data filed at 02/25/2017 1342 Gross per 24 hour  Intake 962 ml  Output 1200 ml  Net -238 ml   Blood pressure (!) 90/53, pulse 76, temperature 98 F (36.7 C), temperature source Oral, resp. rate 18, height 5\' 7"  (1.702 m), weight 177 lb 14.6 oz (80.7 kg), last menstrual period 01/28/2017, SpO2 100 %. Temp:  [98 F (36.7 C)-98.9 F (37.2 C)] 98 F (36.7 C) (02/12 1343) Pulse Rate:  [66-76] 76 (02/12 1343) Resp:  [18-20] 18 (02/12 1343) BP: (90-102)/(53-61) 90/53 (02/12 1343) SpO2:  [96 %-100 %] 100 % (02/12 1343)  Physical Exam: General: Alert and awake, oriented x3,and dysphoric HEENT: anicteric sclera,EOMI CVS regular rate, normal r,  no murmur rubs or gallops Chest: clear to auscultation bilaterally, no wheezing, rales or rhonchi Abdomen: soft nontender, nondistended, normal bowel sounds, Extremities: she  has minimal tenderness of clavicle Skin: rashes resolving on feet  Neuro: she does still have weakness in lower extremities  CBC:  CBC Latest Ref Rng & Units 02/25/2017 02/24/2017 02/23/2017  WBC 4.0 - 10.5 K/uL 8.4 6.9 8.6  Hemoglobin 12.0 - 15.0 g/dL 8.0(L) 8.2(L) 7.9(L)  Hematocrit 36.0 - 46.0 % 25.2(L) 25.4(L) 24.3(L)  Platelets 150 - 400 K/uL 524(H) 488(H) 413(H)      BMET Recent Labs    02/24/17 0406 02/25/17 0410  NA 135 135  K 3.7 3.9  CL 99* 98*  CO2 24 25  GLUCOSE 98 87  BUN 12 11  CREATININE 0.87 0.77  CALCIUM 8.8* 8.7*     Liver Panel  No results for input(s): PROT, ALBUMIN, AST, ALT, ALKPHOS, BILITOT, BILIDIR, IBILI in the last 72 hours.     Sedimentation Rate Recent Labs    02/24/17 1914  ESRSEDRATE 136*   C-Reactive Protein Recent Labs    02/24/17 1209  CRP 7.5*    Micro Results: Recent Results (from the past 720 hour(s))  Culture, blood (Routine x 2)     Status: Abnormal   Collection Time: 02/13/17  4:00 PM  Result Value Ref Range Status   Specimen Description BLOOD RIGHT ANTECUBITAL  Final   Special Requests IN PEDIATRIC BOTTLE Blood Culture adequate volume  Final   Culture  Setup Time   Final    GRAM POSITIVE COCCI IN PEDIATRIC BOTTLE CRITICAL RESULT CALLED TO, READ BACK BY AND VERIFIED WITHMelven Sartorius [facility] 1610 02/14/17 A BROWNING Performed at [facility], [address] [place], [place] [address]    Culture STAPHYLOCOCCUS AUREUS (A)  Final   Report Status 02/17/2017 FINAL  Final   Organism ID, Bacteria STAPHYLOCOCCUS AUREUS  Final      Susceptibility   Staphylococcus aureus - MIC*    CIPROFLOXACIN <=0.5 SENSITIVE Sensitive     ERYTHROMYCIN <=0.25 SENSITIVE Sensitive     GENTAMICIN <=0.5 SENSITIVE Sensitive     OXACILLIN <=0.25 SENSITIVE Sensitive     TETRACYCLINE >=16 RESISTANT Resistant     VANCOMYCIN 1 SENSITIVE Sensitive     TRIMETH/SULFA <=10 SENSITIVE Sensitive     CLINDAMYCIN <=0.25 SENSITIVE Sensitive      RIFAMPIN <=0.5 SENSITIVE Sensitive     Inducible Clindamycin NEGATIVE Sensitive     * STAPHYLOCOCCUS AUREUS  Blood Culture ID Panel (Reflexed)     Status: Abnormal   Collection Time: 02/13/17  4:00 PM  Result Value Ref Range Status   Enterococcus species NOT DETECTED NOT DETECTED Final   Listeria monocytogenes NOT DETECTED NOT DETECTED Final   Staphylococcus species DETECTED (A) NOT DETECTED Final    Comment: CRITICAL RESULT CALLED TO, READ BACK  BY AND VERIFIED WITH: Melven SartoriusJ LEDFORD PHARMD [phone] 02/14/17 A BROWNING    Staphylococcus aureus DETECTED (A) NOT DETECTED Final    Comment: Methicillin (oxacillin) susceptible Staphylococcus aureus (MSSA). Preferred therapy is anti staphylococcal beta lactam antibiotic (Cefazolin or Nafcillin), unless clinically contraindicated. CRITICAL RESULT CALLED TO, READ BACK BY AND VERIFIED WITH: Melven SartoriusJ LEDFORD PHARMD [phone] 02/14/17 A BROWNING    Methicillin resistance NOT DETECTED NOT DETECTED Final   Streptococcus species NOT DETECTED NOT DETECTED Final   Streptococcus agalactiae NOT DETECTED NOT DETECTED Final   Streptococcus pneumoniae NOT DETECTED NOT DETECTED Final   Streptococcus pyogenes NOT DETECTED NOT DETECTED Final   Acinetobacter baumannii NOT DETECTED NOT DETECTED Final   Enterobacteriaceae species NOT DETECTED NOT DETECTED Final   Enterobacter cloacae complex NOT DETECTED NOT DETECTED Final   Escherichia coli NOT DETECTED NOT DETECTED Final   Klebsiella oxytoca NOT DETECTED NOT DETECTED Final   Klebsiella pneumoniae NOT DETECTED NOT DETECTED Final   Proteus species NOT DETECTED NOT DETECTED Final   Serratia marcescens NOT DETECTED NOT DETECTED Final   Haemophilus influenzae NOT DETECTED NOT DETECTED Final   Neisseria meningitidis NOT DETECTED NOT DETECTED Final   Pseudomonas aeruginosa NOT DETECTED NOT DETECTED Final   Candida albicans NOT DETECTED NOT DETECTED Final   Candida glabrata NOT DETECTED NOT DETECTED Final   Candida krusei NOT DETECTED NOT  DETECTED Final   Candida parapsilosis NOT DETECTED NOT DETECTED Final   Candida tropicalis NOT DETECTED NOT DETECTED Final    Comment: Performed at [facility], [address] [place], [place] [address]  Culture, blood (Routine x 2)     Status: Abnormal   Collection Time: 02/13/17  5:00 PM  Result Value Ref Range Status   Specimen Description BLOOD RIGHT ANTECUBITAL  Final   Special Requests   Final    BOTTLES DRAWN AEROBIC AND ANAEROBIC Blood Culture adequate volume   Culture  Setup Time   Final    GRAM POSITIVE COCCI IN CLUSTERS IN BOTH AEROBIC AND ANAEROBIC BOTTLES CRITICAL VALUE NOTED.  VALUE IS CONSISTENT WITH PREVIOUSLY REPORTED AND CALLED VALUE.    Culture (A)  Final    STAPHYLOCOCCUS AUREUS SUSCEPTIBILITIES PERFORMED ON PREVIOUS CULTURE WITHIN THE LAST 5 DAYS. Performed at [facility]  Lab, [address] [place], [place] [address]    Report Status 02/16/2017 FINAL  Final  MRSA PCR Screening     Status: None   Collection Time: 02/13/17  9:32 PM  Result Value Ref Range Status   MRSA by PCR NEGATIVE NEGATIVE Final    Comment:        The GeneXpert MRSA Assay (FDA approved for NASAL specimens only), is one component of a comprehensive MRSA colonization surveillance program. It is not intended to diagnose MRSA infection nor to guide or monitor treatment for MRSA infections.   Culture, blood (Routine X 2) w Reflex to ID Panel     Status: Abnormal   Collection Time: 02/14/17 10:07 AM  Result Value Ref Range Status   Specimen Description BLOOD RIGHT HAND  Final   Special Requests IN PEDIATRIC BOTTLE Blood Culture adequate volume  Final   Culture  Setup Time   Final    GRAM POSITIVE COCCI IN PEDIATRIC BOTTLE CRITICAL VALUE NOTED.  VALUE IS CONSISTENT WITH PREVIOUSLY REPORTED AND CALLED VALUE.    Culture (A)  Final    STAPHYLOCOCCUS AUREUS SUSCEPTIBILITIES PERFORMED ON PREVIOUS CULTURE WITHIN THE LAST 5 DAYS. Performed at [facility]  Lab, [address] [place], [place] [address]    Report Status 02/17/2017 FINAL  Final  Culture, blood (Routine X 2) w Reflex to ID Panel     Status: Abnormal   Collection Time: 02/17/17 10:10 AM  Result Value Ref Range Status   Specimen Description BLOOD RIGHT HAND  Final   Special Requests IN PEDIATRIC BOTTLE Blood Culture adequate volume  Final   Culture  Setup Time   Final    GRAM POSITIVE COCCI IN PEDIATRIC BOTTLE CRITICAL VALUE NOTED.  VALUE IS CONSISTENT WITH PREVIOUSLY REPORTED AND CALLED VALUE.    Culture (A)  Final    STAPHYLOCOCCUS AUREUS SUSCEPTIBILITIES PERFORMED ON PREVIOUS CULTURE WITHIN THE LAST 5 DAYS. Performed at [facility], [address] [place], [place] [address]    Report Status 02/19/2017 FINAL  Final  Culture, blood (Routine X 2) w Reflex to ID Panel     Status: None   Collection Time: 02/17/17 10:15 AM  Result Value Ref Range Status   Specimen Description BLOOD LEFT ANTECUBITAL  Final   Special Requests IN PEDIATRIC BOTTLE Blood Culture adequate volume  Final   Culture   Final    NO GROWTH 5 DAYS Performed at [facility], [address] [place], [place] [address]    Report Status 02/22/2017 FINAL  Final  Culture, blood (Routine X 2) w Reflex to ID Panel     Status: None   Collection Time: 02/19/17  9:15 AM  Result Value Ref Range Status   Specimen Description BLOOD LEFT ANTECUBITAL  Final   Special Requests   Final    BOTTLES DRAWN AEROBIC AND ANAEROBIC Blood Culture adequate volume   Culture   Final    NO GROWTH 5 DAYS Performed at [facility], [address], [place], [place] [address]    Report Status 02/24/2017 FINAL  Final  Culture, blood (Routine X 2) w Reflex to ID Panel     Status: None   Collection Time: 02/19/17  9:30 AM  Result Value Ref Range Status   Specimen Description BLOOD RIGHT ANTECUBITAL  Final   Special Requests   Final    BOTTLES DRAWN AEROBIC AND ANAEROBIC Blood Culture adequate volume   Culture   Final    NO GROWTH 5  DAYS Performed at [facility], [address], [place], [place] [address]    Report Status 02/24/2017 FINAL  Final    Studies/Results: Ct Angio Chest Pe W Or Wo Contrast  Result Date: 02/25/2017 CLINICAL DATA:  Acute onset of pleuritic chest pain and shortness of breath. Elevated D-dimer. EXAM: CT ANGIOGRAPHY CHEST WITH CONTRAST TECHNIQUE: Multidetector CT imaging of the chest was performed using the standard protocol during bolus administration of intravenous contrast. Multiplanar CT image reconstructions and MIPs were obtained to evaluate the vascular anatomy. CONTRAST:  ISOVUE-370 IOPAMIDOL (ISOVUE-370) INJECTION 76% COMPARISON:  Chest radiograph performed 02/23/2016, and CT of the chest performed 02/20/2017 FINDINGS: Cardiovascular:  There is no evidence of pulmonary embolus. The heart is borderline enlarged. The thoracic aorta is grossly unremarkable in appearance. The great vessels are within normal limits. Mediastinum/Nodes: A 1.5 cm azygoesophageal recess node is noted. Visualized hilar nodes are borderline normal in size. No pericardial effusion is identified. Incidental note is made of a retroesophageal right subclavian artery. The visualized portions of the thyroid gland are unremarkable. No axillary lymphadenopathy is appreciated. Lungs/Pleura: Patchy bilateral airspace opacities raise concern for pneumonia, most prominent at the lower lobes bilaterally. Scattered peripheral  nodular opacities are noted throughout both lungs. This is slightly improved from the prior study. There is a 2.9 cm cavitary focus at the right lung base, concerning for atypical infection. No pleural effusion or pneumothorax is seen. Upper Abdomen: The visualized portions of the liver and spleen are unremarkable. Musculoskeletal: No acute osseous abnormalities are identified. The visualized musculature is unremarkable in appearance. Review of the MIP images confirms the above findings. IMPRESSION: 1. No  evidence of pulmonary embolus. 2. As on the recent prior CT, bilateral pneumonia is again noted. Peripheral nodular opacities are seen throughout both lungs, slightly improved from the prior study. 2.9 cm cavitary focus at the right lung base is better characterized, and is concerning for atypical infection. 3. Enlarged 1.5 cm azygoesophageal recess node. Electronically Signed   By: Roanna Raider M.D.   On: 02/25/2017 04:52      Assessment/Plan:  INTERVAL HISTORY:   Patient evaluated by Dr. Criselda Peaches   Principal Problem:   MSSA bacteremia Active Problems:   Sepsis affecting skin (HCC)   Pelvic pain   Cellulitis of left hand   Staphylococcus aureus bacteremia with sepsis (HCC)   Weakness of both lower extremities   Pain of right clavicle   IVDU (intravenous drug user)   Septic arthritis of right sternoclavicular joint (HCC)   PFO (patent foramen ovale)   Tricuspid valve vegetation    [name] is a 32 y.o. 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  Specific aspects of clinical management are critical to optimizing the outcome of patients with SAB.  Therefore, the [facility] Antimicrobial Management Team [facility]) has initiated an intervention aimed at improving the management of SAB at [facility].  To do so, Infectious Diseases physicians are providing an evidence-based consult for the management of all patients with SAB.     Yes No Comments  Perform follow-up blood cultures (even if the patient is afebrile) to ensure clearance of bacteremia [x]  []  02/19/17 culture NO GROWTH x 5 days  Remove vascular catheter and obtain follow-up blood cultures after the removal of the  catheter []  []  Would not place PICC until DISPO is clear  Perform echocardiography to evaluate for endocarditis (transthoracic ECHO is 40-50% sensitive, TEE is > 90% sensitive) []  []  Please keep in mind, that neither test can definitively EXCLUDE endocarditis, and that should clinical suspicion remain high for endocarditis the patient should then still be treated with an "endocarditis" duration of therapy = 6 weeks  She has vegetation with possible perforation on TV w mod to severe TR and PFO  Consult electrophysiologist to evaluate implanted cardiac device (pacemaker, ICD) []  []    Ensure source control []  []  Have all abscesses been drained effectively? Have deep seeded infections (septic joints or osteomyelitis) had appropriate surgical debridement?  SHE HAS BEEN SEEN BY CT SURGERY  Investigate for "metastatic" sites of infection []  []  Does the patient have ANY symptom or physical exam finding that would suggest a deeper infection (back or neck pain that may be suggestive of vertebral osteomyelitis or epidural abscess, muscle pain that could be a symptom of pyomyositis)?  Keep in mind that for deep seeded infections MRI imaging with contrast is preferred rather  than other often insensitive tests such as plain x-rays, especially early in a patient's presentation.   MRI brain was without septic emboli  Changed antibiotic therapy to NAFCILLIN GIVEN PFO AND CONCERN ABOUT SEPTIC EMBOLI TO CNS  []  []  Beta-lactam antibiotics are preferred for MSSA due to higher cure rates.   If on Vancomycin, goal trough should be 15 - 20 mcg/mL  Estimated duration of IV antibiotic therapy:  8 WEEKS ISSUE WILL BE IS SHE  IF  WILLING TO STAY IN THE HOSPITAL TO GET IV ANTIBIOTICS   []  []  Consult case management for probably prolonged outpatient IV antibiotic therapy   #2 Endocarditis 1.8 x 1.7 cm  Vegetation with possible perforation on TV with large PFO:  Larger concern is risk of embolization across the PFO to CNS  or kidneys in the left sided circulation that could occur even if vegetation is sterile. Dr. Tyrone Sage does not recommend TVR in context of her ongoing drug use. Family is unhappy with this but this is a very difficult clinical situation in which Dr. Tyrone Sage has to weigh risks/benefits.   I do think it is possible patient might be able to be treated with IV antibiotics and get effective treatment. Certainly her blood cultures have sterilized. Now I cannot guarantee nor can anyone that the vegetation might not embolize across PFO into left sided circulation even if it is sterilized  If surgery is not pursued would recommend repeat TTE and likely repeat TEE to assess vegetation.   Patients family had been requesting transfer to [facility].  Regardless of where she might be the risk of her doing further IVDU and infected a new prosthetic valve will remain and need to be balanced vs urgency of doing surgery in current situation with large right sided vegetation with PFO  I am going to keep her on nafcillin for now rather than switch to cefazolin just yet  #3 [place] septic arthritis:  I discussed with Dr. Tyrone Sage who was not impressed with ehr [place] joint and did not want to intercede. 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 Criselda Peaches seeing the patient today.   I spent greater than 35  minutes with the patient including greater than 50% of time in face to face counsel of the patient and her stepfather with regards to her endocarditis and potential consequences including septic emboli evaluation by various subspecialties and in coordination of her care with Dr. Criselda Peaches.     LOS: 12 days   Acey Lav 02/25/2017, 4:39 PM

## 2017-02-25 NOTE — Plan of Care (Signed)
Progressing

## 2017-02-25 NOTE — Progress Notes (Signed)
Physical Therapy Treatment Patient Details Name: [name] MRN: [id number] DOB: 21-Aug-1985 Today's Date: 02/25/2017    History of Present Illness Pt is a 32 y.o. female, with past medical history significant for fibromyalgia, anemia and history of IV drug abuse. She presented to the ED with a few days history of fever, chills, and generalized weakness muscle aches in addition to arthralgias.  She was admitted with diagnosis of sepsis.  Pt being treated for endocarditis.     PT Comments    Pt continues to only want to ambulate in room because she only has hospital gown. PT requested pt ask family to bring some clothes so she can ambulate in hallway and do stair training. Despite limited space pt is improving her steadiness and endurance with her ambulation requiring on min guard assist. PT will continue to follow acutely.  Follow Up Recommendations  Home health PT;Supervision/Assistance - 24 hour     Equipment Recommendations  Rolling walker with 5" wheels       Precautions / Restrictions Precautions Precautions: Fall Restrictions Weight Bearing Restrictions: No    Mobility  Bed Mobility Overal bed mobility: Modified Independent Bed Mobility: Supine to Sit;Sit to Supine     Supine to sit: HOB elevated;Supervision Sit to supine: Min guard;HOB elevated   General bed mobility comments: pt sitting EOB upon arrival  Transfers Overall transfer level: Needs assistance Equipment used: Rolling walker (2 wheeled) Transfers: Sit to/from Stand Sit to Stand: Min guard         General transfer comment: min guard for safety  Ambulation/Gait Ambulation/Gait assistance: Min guard Ambulation Distance (Feet): 80 Feet Assistive device: Rolling walker (2 wheeled);1 person hand held assist Gait Pattern/deviations: Step-through pattern;Decreased stride length Gait velocity: decreased Gait velocity interpretation: Below normal speed for age/gender General Gait Details: pt refused to  ambulate in hallway so did laps in her room, started with 30 feet using RW but kept bumping into furniture, so switched to HHA and pt was able to ambulate another 30 feet with only mild instability        Balance Overall balance assessment: Needs assistance Sitting-balance support: No upper extremity supported;Feet supported Sitting balance-Leahy Scale: Good     Standing balance support: During functional activity Standing balance-Leahy Scale: Good                              Cognition Arousal/Alertness: Awake/alert Behavior During Therapy: WFL for tasks assessed/performed Overall Cognitive Status: Within Functional Limits for tasks assessed                                               Pertinent Vitals/Pain Pain Assessment: Faces Pain Score: 6  Faces Pain Scale: Hurts little more Pain Location: bilateral LEs Pain Descriptors / Indicators: Sore;Guarding;Grimacing Pain Intervention(s): Limited activity within patient's tolerance;Monitored during session;Repositioned           PT Goals (current goals can now be found in the care plan section) Acute Rehab PT Goals PT Goal Formulation: With patient Time For Goal Achievement: 03/03/17 Potential to Achieve Goals: Good Progress towards PT goals: Progressing toward goals    Frequency    Min 3X/week      PT Plan Current plan remains appropriate       AM-PAC PT "6 Clicks" Daily Activity  Outcome Measure  Difficulty turning over in bed (including adjusting bedclothes, sheets and blankets)?: None Difficulty moving from lying on back to sitting on the side of the bed? : None Difficulty sitting down on and standing up from a chair with arms (e.g., wheelchair, bedside commode, etc,.)?: None Help needed moving to and from a bed to chair (including a wheelchair)?: None Help needed walking in hospital room?: A Little Help needed climbing 3-5 steps with a railing? : A Little 6 Click Score:  22    End of Session Equipment Utilized During Treatment: Gait belt Activity Tolerance: Patient limited by pain Patient left: in bed;with call bell/phone within reach Nurse Communication: Mobility status PT Visit Diagnosis: Muscle weakness (generalized) (M62.81);Difficulty in walking, not elsewhere classified (R26.2)     Time: [id number] PT Time Calculation (min) (ACUTE ONLY): 20 min  Charges:  $Gait Training: 8-22 mins                    G Codes:       Brittney Mucha B. Beverely RisenVan Fleet PT, DPT Acute Rehabilitation  [phone] Pager [phone]     Elon Alaslizabeth B Van [facility] 02/25/2017, 4:57 PM

## 2017-02-25 NOTE — Progress Notes (Signed)
Central IMTS  Consult note for Suboxone Therapy  [name] is a 32yo woman with PMH of opioid use disorder, substance abuse disorder, depression, PTSD who is currently in the hospital being treated for MSSA bacteremia, TV endocarditis, PFO, and Right  joint septic arthritis.  She is currently on the hospitalist service and being followed by ID and CT surgery.  I was asked to evaluate the patient for transfer to our Suboxone clinic in the IMTS clinic at discharge by Dr. Daiva EvesVan Dam.   [name] has been using substances off and on since 2006.  She reports being started on pain pills after surgeries around that time after which she developed cravings for the medications and started using illicitly.  She was off the medications for a time and then a significant other started snorting pills and she started back again.  She began using IV drugs including heroin in 2013 - 2014.  She reports being off of narcotics for "years" and a 3 month stint of being clean after rehab.  No reported ODs.  She uses methamphetamine as her drug of choice now and is on suboxone for methamphetamine cravings.  She is currently being cared for by Dr. Gerilyn [place]quah in [place] which she reports is a neurology office (confirmed by search).  She is on suboxone 4mg /1mg  BID and she feels this controls her cravings, though she was continuing to use IV drugs prior to this hospitalization.  She reports a diagnosis of bipolar disorder, PTSD for which she is treated with Xanax and Duloxetine by a psychiatrist locally.    On exam, she appears comfortable, she is a young woman though appears older than stated age.  She has no noted murmur, thought documented vegetation, she has good air movement and no respiratory distress.  Her abdomen is soft and non distended with good bowel sounds. She has good peripheral pulses.    On labwork, she has a negative HIV and HCV ab on 02/15/17 and mild AST elevated noted on 02/18/17.  She has an anemia which is  being monitored and managed by her primary MD.   CT scan today showed bilateral pneumonia.    She is currently on her home dose of suboxone, 4mg -1mg  BID.  She reports no cravings on this medication.  She is awaiting a second opinion from CT surgery and thinks they will see her tomorrow (current recommendation is no surgery).  It appears she has been on Abx for 13 days, but only 8 days since her cultures were first negative.  Based on review of chart, she will require 6-8 weeks of IV abx and will presumably be in house during the course of this treatment.  Given her treatment course will be prolonged, and she is already on a stable dose of Suboxone, I have no further recommendations at this time.  Her Suboxone could certainly be titrated up if she were to have further cravings or pain issues while in house.  I will attach my partners to this note.  One of us will re-evaluate her case in 1-2 weeks depending on change in status (surgery or not, plan to discharge).  If change in status occurs, please feel free to contact myself, or Dr. Oswaldo DoneVincent or Dr. Carlynn PurlErik Hoffman.    She would be a fair candidate to continue care in our clinic as she desires a transition to another suboxone provider.  She has been off of narcotics for a while which is the main thing we treat.  She  might benefit more from a substance abuse clinic which could further treat her methamphetamine addiction which is the main driver of her suboxone use at this time.  We are happy to see her in consultation in the clinic once she is discharged.    Debe Coder, MD

## 2017-02-25 NOTE — Progress Notes (Addendum)
PROGRESS NOTE  Tammy ArabCasey M Walling AOZ:[id number] DOB: 02/05/1985 DOA: 02/13/2017 PCP: Pearson GrippeKim, James, MD  HPI/Recap of past 7724 hours: 32 year old female who presented with a chief complaint weakness.  She does have a significant past medical history for fibromyalgia, chronic anemia and history intravenous drug abuse. Complaint of fever, chills, generalized weakness for the last 2 days prior to hospitalization.  She had erythema and petechiae on her left hand and bilateral lower extremities. Patient was admitted to the hospital with diagnosis of sepsis, due to left hand cellulitis, rule out endocarditis.  Today, patient denies any pleuritic chest pain, but reports she gets them intermittently. Pt denies any fever/chills, N/V, abdominal pain, diarrhea  Assessment/Plan: Principal Problem:   MSSA bacteremia Active Problems:   Sepsis affecting skin (HCC)   Pelvic pain   Cellulitis of left hand   Staphylococcus aureus bacteremia with sepsis (HCC)   Weakness of both lower extremities   Pain of right clavicle   IVDU (intravenous drug user)   Septic arthritis of right sternoclavicular joint (HCC)   PFO (patent foramen ovale)   Tricuspid valve vegetation  Staphylococcus aureus bacteremia, complicated with sepsis Afebrile with resolved leukocytosis BC positive with staph aureus, repeat shows no growth till date On IV Nafcillin, expect to be on IV Ab for about 8 weeks total, likely inpatient due to IVDA MRI brain negative for any septic emboli given large PFO ID on board  Tricuspid valve endocarditis with mod-severe TR Pt had TEE on 2/7/9 which showed tricuspid valve endocarditis with ?perforated leaflet, with mod-severe TR. A large PFO was also noted on bubble study ID on board Called consult to CTS Dr Tyrone SageGerhardt: Would not recommend surgery at this time. Family reports not having a good experience with Dr Tyrone SageGerhardt Family wants another CTS, re-consult pending Cardiology consulted  Right  sternoclavicular joint septic arthritis CT chest showed above after pt c/o R shoulder pain Consult to CTS as above  Pleuritic chest pain/??PNA Associated with dyspnea D-dimer elevated, elevated CRP, ??likely due to bacteremia/endocarditis CT angio shows no PE, but shows ??bilateral pneumonia, with 2.9 cm cavitary focus at the right lung base is better characterized, and is concerning for atypical infection Pt has no fever/cough/no leukocytosis/non-toxic appearing ??septic emboli to lung Consider treating, although already on IV AB for endocarditis   Acute on chronic anemia of iron deficiency Stable Hgb 6.7, transfused 1U of PRBC on 02/20/17 Iron panel with iron 14, sats 9% Will transfuse if hgb <7 Continue iron supplements and prophylactic bowel regimen  Urinary tract infection Completed antibiotic therapy  Hyperkalemia Resolved Daily BMET  Thrombocytopenia Resolved  IV drug abuse No signs of withdrawal, will continue neuro checks per unit protocol As needed alprazolam, continue suboxone.   Depression Continue cymbalta and olanzapine   Code Status: Full  Family Communication: Step dad at bedside  Disposition Plan: TBD   Consultants:  ID  CIR  CTS  Procedures:  None  Antimicrobials:  IV Nafcillin   DVT prophylaxis:  Lovenox   Objective: Vitals:   02/24/17 0500 02/24/17 1425 02/24/17 2229 02/25/17 0524  BP: 105/65 (!) 100/58 98/61 (!) 102/54  Pulse: 75 75 76 66  Resp: 18 18 20 18   Temp: 98.2 F (36.8 C) 98.6 F (37 C) 98.9 F (37.2 C) 98.4 F (36.9 C)  TempSrc: Oral Oral    SpO2: 95% 97% 96% 99%  Weight:      Height:        Intake/Output Summary (Last 24 hours) at 02/25/2017 1235  Last data filed at 02/25/2017 6045 Gross per 24 hour  Intake 1082 ml  Output -  Net 1082 ml   Filed Weights   02/13/17 2120 02/17/17 1500  Weight: 76.5 kg (168 lb 10.4 oz) 80.7 kg (177 lb 14.6 oz)    Exam:   General:  Alert, awake, oriented,  deconditioned  Cardiovascular: S1-S2 present, no added heart sounds  Respiratory: Chest clear bilaterally  Abdomen: Soft, nontender, nondistended, bowel sounds present  Musculoskeletal: No pedal edema bilaterally  Skin: Improved rash on the left hand  Psychiatry: Normal mood   Data Reviewed: CBC: Recent Labs  Lab 02/21/17 0652 02/22/17 0212 02/23/17 0230 02/24/17 0406 02/25/17 0410  WBC 8.8 9.4 8.6 6.9 8.4  NEUTROABS 5.8 5.5 5.5 3.9 5.3  HGB 7.5* 8.1* 7.9* 8.2* 8.0*  HCT 22.9* 24.8* 24.3* 25.4* 25.2*  MCV 87.4 88.3 88.7 88.2 89.0  PLT 387 364 413* 488* 524*   Basic Metabolic Panel: Recent Labs  Lab 02/21/17 0652 02/22/17 0212 02/23/17 0230 02/24/17 0406 02/25/17 0410  NA 133* 132* 132* 135 135  K 4.3 4.2 3.4* 3.7 3.9  CL 99* 98* 98* 99* 98*  CO2 21* 21* 21* 24 25  GLUCOSE 89 90 122* 98 87  BUN 14 13 11 12 11   CREATININE 0.83 0.74 0.86 0.87 0.77  CALCIUM 8.9 8.7* 8.6* 8.8* 8.7*   GFR: Estimated Creatinine Clearance: 110.3 mL/min (by C-G formula based on SCr of 0.77 mg/dL). Liver Function Tests: No results for input(s): AST, ALT, ALKPHOS, BILITOT, PROT, ALBUMIN in the last 168 hours. No results for input(s): LIPASE, AMYLASE in the last 168 hours. No results for input(s): AMMONIA in the last 168 hours. Coagulation Profile: No results for input(s): INR, PROTIME in the last 168 hours. Cardiac Enzymes: No results for input(s): CKTOTAL, CKMB, CKMBINDEX, TROPONINI in the last 168 hours. BNP (last 3 results) No results for input(s): PROBNP in the last 8760 hours. HbA1C: No results for input(s): HGBA1C in the last 72 hours. CBG: No results for input(s): GLUCAP in the last 168 hours. Lipid Profile: No results for input(s): CHOL, HDL, LDLCALC, TRIG, CHOLHDL, LDLDIRECT in the last 72 hours. Thyroid Function Tests: No results for input(s): TSH, T4TOTAL, FREET4, T3FREE, THYROIDAB in the last 72 hours. Anemia Panel: No results for input(s): VITAMINB12, FOLATE,  FERRITIN, TIBC, IRON, RETICCTPCT in the last 72 hours. Urine analysis:    Component Value Date/Time   COLORURINE AMBER (A) 02/13/2017 0436   APPEARANCEUR HAZY (A) 02/13/2017 0436   LABSPEC 1.014 02/13/2017 0436   PHURINE 5.0 02/13/2017 0436   GLUCOSEU NEGATIVE 02/13/2017 0436   HGBUR LARGE (A) 02/13/2017 0436   BILIRUBINUR NEGATIVE 02/13/2017 0436   KETONESUR NEGATIVE 02/13/2017 0436   PROTEINUR NEGATIVE 02/13/2017 0436   UROBILINOGEN 0.2 12/07/2012 1510   NITRITE POSITIVE (A) 02/13/2017 0436   LEUKOCYTESUR TRACE (A) 02/13/2017 0436   Sepsis Labs: @LABRCNTIP (procalcitonin:4,lacticidven:4)  ) Recent Results (from the past 240 hour(s))  Culture, blood (Routine X 2) w Reflex to ID Panel     Status: Abnormal   Collection Time: 02/17/17 10:10 AM  Result Value Ref Range Status   Specimen Description BLOOD RIGHT HAND  Final   Special Requests IN PEDIATRIC BOTTLE Blood Culture adequate volume  Final   Culture  Setup Time   Final    GRAM POSITIVE COCCI IN PEDIATRIC BOTTLE CRITICAL VALUE NOTED.  VALUE IS CONSISTENT WITH PREVIOUSLY REPORTED AND CALLED VALUE.    Culture (A)  Final    STAPHYLOCOCCUS AUREUS SUSCEPTIBILITIES  PERFORMED ON PREVIOUS CULTURE WITHIN THE LAST 5 DAYS. Performed at [facility], [address], [place], [place] [address]    Report Status 02/19/2017 FINAL  Final  Culture, blood (Routine X 2) w Reflex to ID Panel     Status: None   Collection Time: 02/17/17 10:15 AM  Result Value Ref Range Status   Specimen Description BLOOD LEFT ANTECUBITAL  Final   Special Requests IN PEDIATRIC BOTTLE Blood Culture adequate volume  Final   Culture   Final    NO GROWTH 5 DAYS Performed at [facility], [address], [place], [place] [address]    Report Status 02/22/2017 FINAL  Final  Culture, blood (Routine X 2) w Reflex to ID Panel     Status: None   Collection Time: 02/19/17  9:15 AM  Result Value Ref Range Status   Specimen Description BLOOD LEFT  ANTECUBITAL  Final   Special Requests   Final    BOTTLES DRAWN AEROBIC AND ANAEROBIC Blood Culture adequate volume   Culture   Final    NO GROWTH 5 DAYS Performed at [facility], [address] [place], [place] [address]    Report Status 02/24/2017 FINAL  Final  Culture, blood (Routine X 2) w Reflex to ID Panel     Status: None   Collection Time: 02/19/17  9:30 AM  Result Value Ref Range Status   Specimen Description BLOOD RIGHT ANTECUBITAL  Final   Special Requests   Final    BOTTLES DRAWN AEROBIC AND ANAEROBIC Blood Culture adequate volume   Culture   Final    NO GROWTH 5 DAYS Performed at [facility], [address] [place], [place] [address]    Report Status 02/24/2017 FINAL  Final      Studies: Ct Angio Chest Pe W Or Wo Contrast  Result Date: 02/25/2017 CLINICAL DATA:  Acute onset of pleuritic chest pain and shortness of breath. Elevated D-dimer. EXAM: CT ANGIOGRAPHY CHEST WITH CONTRAST TECHNIQUE: Multidetector CT imaging of the chest was performed using the standard protocol during bolus administration of intravenous contrast. Multiplanar CT image reconstructions and MIPs were obtained to evaluate the vascular anatomy. CONTRAST:  ISOVUE-370 IOPAMIDOL (ISOVUE-370) INJECTION 76% COMPARISON:  Chest radiograph performed 02/23/2016, and CT of the chest performed 02/20/2017 FINDINGS: Cardiovascular:  There is no evidence of pulmonary embolus. The heart is borderline enlarged. The thoracic aorta is grossly unremarkable in appearance. The great vessels are within normal limits. Mediastinum/Nodes: A 1.5 cm azygoesophageal recess node is noted. Visualized hilar nodes are borderline normal in size. No pericardial effusion is identified. Incidental note is made of a retroesophageal right subclavian artery. The visualized portions of the thyroid gland are unremarkable. No axillary lymphadenopathy is appreciated. Lungs/Pleura: Patchy bilateral airspace opacities raise concern  for pneumonia, most prominent at the lower lobes bilaterally. Scattered peripheral nodular opacities are noted throughout both lungs. This is slightly improved from the prior study. There is a 2.9 cm cavitary focus at the right lung base, concerning for atypical infection. No pleural effusion or pneumothorax is seen. Upper Abdomen: The visualized portions of the liver and spleen are unremarkable. Musculoskeletal: No acute osseous abnormalities are identified. The visualized musculature is unremarkable in appearance. Review of the MIP images confirms the above findings. IMPRESSION: 1. No evidence of pulmonary embolus. 2. As on the recent prior CT, bilateral pneumonia is again noted. Peripheral nodular opacities are seen throughout both lungs, slightly improved from the prior study.  2.9 cm cavitary focus at the right lung base is better characterized, and is concerning for atypical infection. 3. Enlarged 1.5 cm azygoesophageal recess node. Electronically Signed   By: Roanna Raider M.D.   On: 02/25/2017 04:52    Scheduled Meds: . buprenorphine-naloxone  2 tablet Sublingual Daily  . DULoxetine  60 mg Oral BID  . enoxaparin (LOVENOX) injection  40 mg Subcutaneous Q24H  . feeding supplement (ENSURE ENLIVE)  237 mL Oral BID BM  . ferrous sulfate  325 mg Oral TID WC  . multivitamin with minerals  1 tablet Oral Daily  . OLANZapine  5 mg Oral q morning - 10a  . polyethylene glycol  17 g Oral Daily  . senna-docusate  1 tablet Oral BID    Continuous Infusions: . nafcillin IV Stopped (02/25/17 0913)     LOS: 12 days     Briant Cedar, MD [facility]  If 7PM-7AM, please contact night-coverage [email or web address] Password [facility] 02/25/2017, 12:35 PM

## 2017-02-25 NOTE — Progress Notes (Signed)
Occupational Therapy Treatment Patient Details Name: [name] MRN: [id number] DOB: 1985-11-06 Today's Date: 02/25/2017    History of present illness Pt is a 32 y.o. female, with past medical history significant for fibromyalgia, anemia and history of IV drug abuse. She presented to the ED with a few days history of fever, chills, and generalized weakness muscle aches in addition to arthralgias.  She was admitted with diagnosis of sepsis.  Pt being treated for endocarditis.    OT comments  Pt making progress with functional goals, OT will continue to follow acutely  Follow Up Recommendations  Home health OT;Supervision/Assistance - 24 hour    Equipment Recommendations  3 in 1 bedside commode;Tub/shower seat    Recommendations for Other Services      Precautions / Restrictions Precautions Precautions: Fall Restrictions Weight Bearing Restrictions: No       Mobility Bed Mobility               General bed mobility comments: pt sitting EOB upon arrival  Transfers Overall transfer level: Needs assistance Equipment used: Rolling walker (2 wheeled) Transfers: Sit to/from Stand Sit to Stand: Min guard         General transfer comment: min guard for safety    Balance Overall balance assessment: Needs assistance Sitting-balance support: No upper extremity supported;Feet supported Sitting balance-Leahy Scale: Good     Standing balance support: During functional activity;Single extremity supported Standing balance-Leahy Scale: Normal                             ADL either performed or assessed with clinical judgement   ADL Overall ADL's : Needs assistance/impaired     Grooming: Min guard;Standing;Wash/dry hands;Wash/dry face       Lower Body Bathing: Min guard;Sit to/from stand       Lower Body Dressing: Sit to/from stand;Min guard   Toilet Transfer: Min guard;Ambulation;RW;Regular Toilet;Grab bars   Toileting- Clothing Manipulation and  Hygiene: Min guard;Sit to/from stand   Tub/ Shower Transfer: Min guard;Ambulation;Rolling walker;3 in 1   Functional mobility during ADLs: Min guard;Rolling walker       Vision Baseline Vision/History: Wears glasses Patient Visual Report: No change from baseline     Perception     Praxis      Cognition Arousal/Alertness: Awake/alert Behavior During Therapy: WFL for tasks assessed/performed Overall Cognitive Status: Within Functional Limits for tasks assessed                                          Exercises     Shoulder Instructions       General Comments      Pertinent Vitals/ Pain       Pain Assessment: 0-10 Pain Score: 6  Pain Location: bilateral LEs Pain Descriptors / Indicators: Sore;Guarding;Grimacing Pain Intervention(s): Limited activity within patient's tolerance;Monitored during session;Repositioned  Home Living                                          Prior Functioning/Environment              Frequency           Progress Toward Goals  OT Goals(current goals can now be found in the care plan section)  Progress towards OT goals: Progressing toward goals     Plan      Co-evaluation                 AM-PAC PT "6 Clicks" Daily Activity     Outcome Measure   Help from another person eating meals?: None Help from another person taking care of personal grooming?: A Little Help from another person toileting, which includes using toliet, bedpan, or urinal?: A Little Help from another person bathing (including washing, rinsing, drying)?: A Little Help from another person to put on and taking off regular upper body clothing?: A Little Help from another person to put on and taking off regular lower body clothing?: A Little 6 Click Score: 19    End of Session Equipment Utilized During Treatment: Rolling walker;Gait belt  Pain - Right/Left: (bilaterally) Pain - part of body: Leg   Activity  Tolerance Patient tolerated treatment well   Patient Left with call bell/phone within reach;in bed(sitting EOB)   Nurse Communication      Functional Assessment Tool Used: AM-PAC 6 Clicks Daily Activity   Time: 1257-1311 OT Time Calculation (min): 14 min  Charges: OT G-codes **NOT FOR INPATIENT CLASS** Functional Assessment Tool Used: AM-PAC 6 Clicks Daily Activity OT General Charges $OT Visit: 1 Visit OT Treatments $Self Care/Home Management : 8-22 mins     Galen ManilaSpencer, Ardelle [place] Jeanette 02/25/2017, 1:43 PM

## 2017-02-25 NOTE — Consult Note (Signed)
Referring Physician: Thomos Lemons, MD  [name] is an 32 y.o. female.                       Chief Complaint: TV endocarditis, MSSA bacteremia, PFO and right sternoclavicular joint septic arhtritis  HPI: 32 year old IV drug user, Opioid user, with PTSD is treated for MSSA bacteremia. 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 She has been stabilized with IV antibiotics nafcillin use. She is not a candidate for Valve replacement per CVTS. She denies chest pain or shortness of breath.  Past Medical History:  Diagnosis Date  . Abdominal pain, unspecified site   . Anemia, unspecified   . Anxiety   . Anxiety state, unspecified   . Asthma   . Disc herniation    causes sciatica unsure which disc  . Esophageal reflux   . Fatty liver   . Fibromyalgia   . Gastroparesis   . Headache(784.0)   . Heart murmur   . History of narcotic addiction ([place]) 09/30/2012   2010:  Per pt, was addicted to narcotics; mom helped intervene and stopped taking opiods   . Hyperemesis gravidarum with metabolic disturbance, unspecified as to episode of care   . Irritable bowel syndrome   . Irritable bowel syndrome   . MSSA bacteremia 02/20/2017  . Nausea alone   . Other and unspecified noninfectious gastroenteritis and colitis(558.9)   . Other dysphagia   . Pregnant   . PTSD (post-traumatic stress disorder)   . Septic arthritis of right sternoclavicular joint ([facility] [place] [facility]) 02/20/2017  . Unspecified asthma(493.90)   . Unspecified constipation       Past Surgical History:  Procedure Laterality Date  . APPENDECTOMY    . CHOLECYSTECTOMY    . COLONOSCOPY    . LAPAROSCOPIC BILATERAL SALPINGECTOMY Bilateral 12/08/2012   Procedure: LAPAROSCOPIC BILATERAL SALPINGECTOMY;  Surgeon: Jonnie Kind, MD;  Location: [facility];  Service: Gynecology;  Laterality: Bilateral;  . MULTIPLE EXTRACTIONS WITH ALVEOLOPLASTY N/A 12/01/2015   Procedure: EXTRACTION TEETH TWO, THREE,  FOUR, SIX, SEVEN, EIGHT, NINE, TEN, ELEVEN, TWELVE, FOURTEEN, FIFTEEN, TWENTY, TWENTY ONE, TWENTY EIGHT, TWENTY NINE, THIRTY AND THIRTY ONE WITH ALVEOLOPLASTY;  Surgeon: Diona Browner, DDS;  Location: [facility];  Service: Oral Surgery;  Laterality: N/A;  . TEE WITHOUT CARDIOVERSION N/A 02/20/2017   Procedure: TRANSESOPHAGEAL ECHOCARDIOGRAM (TEE);  Surgeon: Larey Dresser, MD;  Location: [facility];  Service: Cardiovascular;  Laterality: N/A;  . TUBAL LIGATION      Family History  Problem Relation Age of Onset  . Ulcerative colitis Paternal Grandmother   . Cancer Paternal Grandmother   . Colon polyps Paternal Grandfather   . Cancer Paternal Grandfather        thyroid  . Diabetes Father   . Heart disease Father   . Kidney disease Father   . Hypertension Father    Social History:  reports that she has been smoking cigarettes.  She has a 20.00 pack-year smoking history. she has never used smokeless tobacco. She reports that she does not drink alcohol or use drugs.  Allergies:  Allergies  Allergen Reactions  . Azithromycin Nausea And Vomiting    ABDOMINAL PAIN  . Nsaids     UNSPECIFIED REACTION    . Hydrocodone Nausea And Vomiting  . Penicillins Nausea And Vomiting     Has patient had a PCN reaction causing immediate rash, facial/tongue/throat swelling, SOB or lightheadedness with  hypotension: No Has patient had a PCN reaction causing severe rash involving mucus membranes or skin necrosis: No Has patient had a PCN reaction that required hospitalization No Has patient had a PCN reaction occurring within the last 10 years: No If all of the above answers are "NO", then may proceed with Cephalosporin use.   . Prednisone Nausea Only and Other (See Comments)    Can take shot, but pill form "caused stomach pain , nausea"    Medications Prior to Admission  Medication Sig Dispense Refill  . acetaminophen (TYLENOL) 500 MG tablet Take 1,000 mg by mouth every 6 (six) hours as needed for mild  pain or moderate pain.    Marland Kitchen ALPRAZolam (XANAX) 0.5 MG tablet Take 1 tablet (0.5 mg total) by mouth 3 (three) times daily as needed for anxiety. (Patient taking differently: Take 0.5 mg by mouth 4 (four) times daily. ) 21 tablet 0  . Buprenorphine HCl-Naloxone HCl (SUBOXONE) 4-1 MG FILM Place 2 Film under the tongue daily.     . DULoxetine (CYMBALTA) 60 MG capsule Take 60 mg by mouth 2 (two) times daily.    . Multiple Vitamin (MULTIVITAMIN WITH MINERALS) TABS tablet Take 1 tablet by mouth daily. ALL NATURAL VITAMIN: Made by [employer] (system six)    . OLANZapine (ZYPREXA) 5 MG tablet Take 1 tablet by mouth every morning.    . ondansetron (ZOFRAN) 4 MG tablet Take 1 tablet (4 mg total) by mouth every 6 (six) hours. For nausea or vomiting 12 tablet 0  . cyclobenzaprine (FLEXERIL) 10 MG tablet Take 1 tablet (10 mg total) by mouth 3 (three) times daily. 12 tablet 0    Results for orders placed or performed during the hospital encounter of 02/13/17 (from the past 48 hour(s))  CBC with Differential/Platelet     Status: Abnormal   Collection Time: 02/24/17  4:06 AM  Result Value Ref Range   WBC 6.9 4.0 - 10.5 K/uL   RBC 2.88 (L) 3.87 - 5.11 MIL/uL   Hemoglobin 8.2 (L) 12.0 - 15.0 g/dL   HCT 25.4 (L) 36.0 - 46.0 %   MCV 88.2 78.0 - 100.0 fL   MCH 28.5 26.0 - 34.0 pg   MCHC 32.3 30.0 - 36.0 g/dL   RDW 15.4 11.5 - 15.5 %   Platelets 488 (H) 150 - 400 K/uL   Neutrophils Relative % 56 %   Neutro Abs 3.9 1.7 - 7.7 K/uL   Lymphocytes Relative 35 %   Lymphs Abs 2.4 0.7 - 4.0 K/uL   Monocytes Relative 7 %   Monocytes Absolute 0.5 0.1 - 1.0 K/uL   Eosinophils Relative 2 %   Eosinophils Absolute 0.1 0.0 - 0.7 K/uL   Basophils Relative 0 %   Basophils Absolute 0.0 0.0 - 0.1 K/uL    Comment: Performed at [facility], [address], [place], [place] [address]  Basic metabolic panel     Status: Abnormal   Collection Time: 02/24/17  4:06 AM  Result Value Ref Range   Sodium 135 135 - 145 mmol/L    Potassium 3.7 3.5 - 5.1 mmol/L   Chloride 99 (L) 101 - 111 mmol/L   CO2 24 22 - 32 mmol/L   Glucose, Bld 98 65 - 99 mg/dL   BUN 12 6 - 20 mg/dL   Creatinine, Ser 0.87 0.44 - 1.00 mg/dL   Calcium 8.8 (L) 8.9 - 10.3 mg/dL   GFR calc non Af Amer >60 >60 mL/min   GFR  calc Af Amer >60 >60 mL/min    Comment: (NOTE) The eGFR has been calculated using the CKD EPI equation. This calculation has not been validated in all clinical situations. eGFR's persistently <60 mL/min signify possible Chronic Kidney Disease.    Anion gap 12 5 - 15    Comment: Performed at [facility] [address], [place], [place] [address]  Procalcitonin - Baseline     Status: None   Collection Time: 02/24/17  4:06 AM  Result Value Ref Range   Procalcitonin 0.17 ng/mL    Comment:        Interpretation: PCT (Procalcitonin) <= 0.5 ng/mL: Systemic infection (sepsis) is not likely. Local bacterial infection is possible. (NOTE)       Sepsis PCT Algorithm           Lower Respiratory Tract                                      Infection PCT Algorithm    ----------------------------     ----------------------------         PCT < 0.25 ng/mL                PCT < 0.10 ng/mL         Strongly encourage             Strongly discourage   discontinuation of antibiotics    initiation of antibiotics    ----------------------------     -----------------------------       PCT 0.25 - 0.50 ng/mL            PCT 0.10 - 0.25 ng/mL               OR       >80% decrease in PCT            Discourage initiation of                                            antibiotics      Encourage discontinuation           of antibiotics    ----------------------------     -----------------------------         PCT >= 0.50 ng/mL              PCT 0.26 - 0.50 ng/mL               AND        <80% decrease in PCT             Encourage initiation of                                             antibiotics       Encourage continuation           of  antibiotics    ----------------------------     -----------------------------        PCT >= 0.50 ng/mL                  PCT > 0.50 ng/mL               AND  increase in PCT                  Strongly encourage                                      initiation of antibiotics    Strongly encourage escalation           of antibiotics                                     -----------------------------                                           PCT <= 0.25 ng/mL                                                 OR                                        > 80% decrease in PCT                                     Discontinue / Do not initiate                                             antibiotics Performed at [facility], [address], [place], [place] [address]   D-dimer, quantitative (not at [facility])     Status: Abnormal   Collection Time: 02/24/17 12:09 PM  Result Value Ref Range   D-Dimer, Quant 5.72 (H) 0.00 - 0.50 ug/mL-FEU    Comment: (NOTE) At the manufacturer cut-off of 0.50 ug/mL FEU, this assay has been documented to exclude PE with a sensitivity and negative predictive value of 97 to 99%.  At this time, this assay has not been approved by the FDA to exclude DVT/VTE. Results should be correlated with clinical presentation. Performed at [facility], [place] [address] [place], [place] [address]   C-reactive protein     Status: Abnormal   Collection Time: 02/24/17 12:09 PM  Result Value Ref Range   CRP 7.5 (H) <1.0 mg/dL    Comment: Performed at [facility], [place] [address] [place], [place] [address]  Sedimentation rate     Status: Abnormal   Collection Time: 02/24/17  7:14 PM  Result Value Ref Range   Sed Rate 136 (H) 0 - 22 mm/hr    Comment: Performed at [facility] [address] [place], [place] [address]  CBC with Differential/Platelet     Status: Abnormal   Collection Time: 02/25/17  4:10 AM  Result Value Ref Range   WBC 8.4 4.0 - 10.5 K/uL    RBC 2.83 (L) 3.87 - 5.11 MIL/uL   Hemoglobin 8.0 (L) 12.0 -  15.0 g/dL   HCT 25.2 (L) 36.0 - 46.0 %   MCV 89.0 78.0 - 100.0 fL   MCH 28.3 26.0 - 34.0 pg   MCHC 31.7 30.0 - 36.0 g/dL   RDW 15.5 11.5 - 15.5 %   Platelets 524 (H) 150 - 400 K/uL   Neutrophils Relative % 63 %   Neutro Abs 5.3 1.7 - 7.7 K/uL   Lymphocytes Relative 28 %   Lymphs Abs 2.4 0.7 - 4.0 K/uL   Monocytes Relative 7 %   Monocytes Absolute 0.6 0.1 - 1.0 K/uL   Eosinophils Relative 2 %   Eosinophils Absolute 0.2 0.0 - 0.7 K/uL   Basophils Relative 0 %   Basophils Absolute 0.0 0.0 - 0.1 K/uL    Comment: Performed at [facility] [address] [place], [place] [address]  Basic metabolic panel     Status: Abnormal   Collection Time: 02/25/17  4:10 AM  Result Value Ref Range   Sodium 135 135 - 145 mmol/L   Potassium 3.9 3.5 - 5.1 mmol/L   Chloride 98 (L) 101 - 111 mmol/L   CO2 25 22 - 32 mmol/L   Glucose, Bld 87 65 - 99 mg/dL   BUN 11 6 - 20 mg/dL   Creatinine, Ser 0.77 0.44 - 1.00 mg/dL   Calcium 8.7 (L) 8.9 - 10.3 mg/dL   GFR calc non Af Amer >60 >60 mL/min   GFR calc Af Amer >60 >60 mL/min    Comment: (NOTE) The eGFR has been calculated using the CKD EPI equation. This calculation has not been validated in all clinical situations. eGFR's persistently <60 mL/min signify possible Chronic Kidney Disease.    Anion gap 12 5 - 15    Comment: Performed at [facility] [address] [place], [place] [address]   Ct Angio Chest Pe W Or Wo Contrast  Result Date: 02/25/2017 CLINICAL DATA:  Acute onset of pleuritic chest pain and shortness of breath. Elevated D-dimer. EXAM: CT ANGIOGRAPHY CHEST WITH CONTRAST TECHNIQUE: Multidetector CT imaging of the chest was performed using the standard protocol during bolus administration of intravenous contrast. Multiplanar CT image reconstructions and MIPs were obtained to evaluate the vascular anatomy. CONTRAST:  150m ISOVUE-370 IOPAMIDOL (ISOVUE-370) INJECTION 76%  COMPARISON:  Chest radiograph performed 02/23/2016, and CT of the chest performed 02/20/2017 FINDINGS: Cardiovascular:  There is no evidence of pulmonary embolus. The heart is borderline enlarged. The thoracic aorta is grossly unremarkable in appearance. The great vessels are within normal limits. Mediastinum/Nodes: A 1.5 cm azygoesophageal recess node is noted. Visualized hilar nodes are borderline normal in size. No pericardial effusion is identified. Incidental note is made of a retroesophageal right subclavian artery. The visualized portions of the thyroid gland are unremarkable. No axillary lymphadenopathy is appreciated. Lungs/Pleura: Patchy bilateral airspace opacities raise concern for pneumonia, most prominent at the lower lobes bilaterally. Scattered peripheral nodular opacities are noted throughout both lungs. This is slightly improved from the prior study. There is a 2.9 cm cavitary focus at the right lung base, concerning for atypical infection. No pleural effusion or pneumothorax is seen. Upper Abdomen: The visualized portions of the liver and spleen are unremarkable. Musculoskeletal: No acute osseous abnormalities are identified. The visualized musculature is unremarkable in appearance. Review of the MIP images confirms the above findings. IMPRESSION: 1. No evidence of pulmonary embolus. 2. As on the recent prior CT, bilateral pneumonia is again noted. Peripheral nodular opacities are seen throughout both lungs, slightly improved  from the prior study. 2.9 cm cavitary focus at the right lung base is better characterized, and is concerning for atypical infection. 3. Enlarged 1.5 cm azygoesophageal recess node. Electronically Signed   By: Garald Balding M.D.   On: 02/25/2017 04:52    Review Of Systems Constitutional: Positive fever, chills, no weight loss or gain. Eyes: No vision change, No discharge or pain. Ears: No hearing loss, No tinnitus. Respiratory: No asthma, COPD, pneumonias. Positive  shortness of breath. No hemoptysis. Cardiovascular: No chest pain, palpitation, positive leg edema. Gastrointestinal: Positive nausea, vomiting, diarrhea, no constipation. No GI bleed. No hepatitis. Genitourinary: No dysuria, hematuria, kidney stone. No incontinance. Neurological: Positive headache, no stroke, seizures.  Psychiatry: Posiotive psych facility admission for anxiety, depression, suicide. Positive detox. Skin: No rash. Musculoskeletal: Positive joint pain, fibromyalgia, neck pain, back pain. Lymphadenopathy: No lymphadenopathy. Hematology: Positive anemia. No easy bruising.   Blood pressure (!) 90/53, pulse 76, temperature 98 F (36.7 C), temperature source Oral, resp. rate 18, height 5' 7" (1.702 m), weight 80.7 kg (177 lb 14.6 oz), last menstrual period 01/28/2017, SpO2 100 %. Body mass index is 27.86 kg/m. General appearance: alert, cooperative, appears stated age and no distress Head: Normocephalic, atraumatic. Eyes: Blue eyes, pink conjunctiva, corneas clear. PERRL, EOM's intact. Neck: No adenopathy, no carotid bruit, no JVD, supple, symmetrical, trachea midline and thyroid not enlarged. Resp: Clear to auscultation bilaterally. Cardio: Regular rate and rhythm, S1, S2 normal, III/VI systolic murmur left sternal border, no click, rub or gallop. GI: Soft, non-tender; bowel sounds normal; no organomegaly. Extremities: No edema, cyanosis or clubbing. Skin: Warm and dry.  Neurologic: Alert and oriented X 3, normal strength. Normal coordination.  Assessment/Plan MSSA Bacteremia TV vegetation Moderate to severe TR Polysubstance use/IV drug abuse PTSD Anemia Depression  Follow with infectious disease for prolonged antibiotic use. Patient understood need to give up drugs to improve general well being and cardiovascular health.  Birdie Riddle, MD  02/25/2017, 9:21 PM

## 2017-02-26 DIAGNOSIS — R51 Headache: Secondary | ICD-10-CM

## 2017-02-26 LAB — CBC WITH DIFFERENTIAL/PLATELET
BASOS ABS: 0 10*3/uL (ref 0.0–0.1)
BASOS PCT: 0 %
Eosinophils Absolute: 0.2 10*3/uL (ref 0.0–0.7)
Eosinophils Relative: 2 %
HCT: 27 % — ABNORMAL LOW (ref 36.0–46.0)
HEMOGLOBIN: 8.6 g/dL — AB (ref 12.0–15.0)
Lymphocytes Relative: 35 %
Lymphs Abs: 2.9 10*3/uL (ref 0.7–4.0)
MCH: 28.6 pg (ref 26.0–34.0)
MCHC: 31.9 g/dL (ref 30.0–36.0)
MCV: 89.7 fL (ref 78.0–100.0)
Monocytes Absolute: 0.8 10*3/uL (ref 0.1–1.0)
Monocytes Relative: 9 %
NEUTROS PCT: 54 %
Neutro Abs: 4.5 10*3/uL (ref 1.7–7.7)
Platelets: 513 10*3/uL — ABNORMAL HIGH (ref 150–400)
RBC: 3.01 MIL/uL — ABNORMAL LOW (ref 3.87–5.11)
RDW: 15.6 % — AB (ref 11.5–15.5)
WBC: 8.3 10*3/uL (ref 4.0–10.5)

## 2017-02-26 LAB — BASIC METABOLIC PANEL
ANION GAP: 12 (ref 5–15)
BUN: 9 mg/dL (ref 6–20)
CALCIUM: 8.7 mg/dL — AB (ref 8.9–10.3)
CHLORIDE: 101 mmol/L (ref 101–111)
CO2: 23 mmol/L (ref 22–32)
Creatinine, Ser: 0.86 mg/dL (ref 0.44–1.00)
GFR calc non Af Amer: >60 mL/min (ref >60)
Glucose, Bld: 96 mg/dL (ref 65–99)
Potassium: 4.6 mmol/L (ref 3.5–5.1)
SODIUM: 136 mmol/L (ref 135–145)

## 2017-02-26 NOTE — Progress Notes (Signed)
Subjective:  Patient noticing worsening shoulder pain.  She also had a few headaches yesterday  Antibiotics:  Anti-infectives (From admission, onward)   Start     Dose/Rate Route Frequency Ordered Stop   02/24/17 1600  nafcillin 2 g in sodium chloride 0.9 % 100 mL IVPB     2 g 200 mL/hr over 30 Minutes Intravenous Every 4 hours 02/24/17 0833     02/21/17 2000  nafcillin 2 g in dextrose 5 % 100 mL IVPB  Status:  Discontinued     2 g 200 mL/hr over 30 Minutes Intravenous Every 4 hours 02/21/17 1741 02/24/17 0910   02/21/17 1730  nafcillin injection 2 g  Status:  Discontinued     2 g Intravenous Every 4 hours 02/21/17 1726 02/21/17 1740   02/19/17 1230  ceFAZolin (ANCEF) IVPB 2g/100 mL premix  Status:  Discontinued     2 g 200 mL/hr over 30 Minutes Intravenous Every 8 hours 02/19/17 1148 02/21/17 1726   02/18/17 1330  linezolid (ZYVOX) tablet 600 mg  Status:  Discontinued     600 mg Oral Every 12 hours 02/18/17 1250 02/19/17 1126   02/17/17 0800  sulfamethoxazole-trimethoprim (BACTRIM DS,SEPTRA DS) 800-160 MG per tablet 2 tablet  Status:  Discontinued     2 tablet Oral Every 12 hours 02/16/17 1453 02/18/17 1250   02/16/17 1600  sulfamethoxazole-trimethoprim (BACTRIM DS,SEPTRA DS) 800-160 MG per tablet 2 tablet     2 tablet Oral  Once 02/16/17 1509 02/16/17 1657   02/14/17 1400  ceFAZolin (ANCEF) IVPB 2g/100 mL premix  Status:  Discontinued     2 g 200 mL/hr over 30 Minutes Intravenous Every 8 hours 02/14/17 1013 02/16/17 1453   02/14/17 0200  vancomycin (VANCOCIN) IVPB 750 mg/150 ml premix  Status:  Discontinued     750 mg 150 mL/hr over 60 Minutes Intravenous Every 8 hours 02/13/17 1739 02/14/17 1017   02/14/17 0000  piperacillin-tazobactam (ZOSYN) IVPB 3.375 g  Status:  Discontinued     3.375 g 12.5 mL/hr over 240 Minutes Intravenous Every 8 hours 02/13/17 1739 02/14/17 1013   02/13/17 1715  piperacillin-tazobactam (ZOSYN) IVPB 3.375 g     3.375 g 100 mL/hr over 30  Minutes Intravenous  Once 02/13/17 1701 02/13/17 1824   02/13/17 1715  vancomycin (VANCOCIN) IVPB 1000 mg/200 mL premix  Status:  Discontinued     1,000 mg 200 mL/hr over 60 Minutes Intravenous  Once 02/13/17 1701 02/13/17 1705   02/13/17 1715  vancomycin (VANCOCIN) 1,500 mg in sodium chloride 0.9 % 500 mL IVPB     1,500 mg 250 mL/hr over 120 Minutes Intravenous  Once 02/13/17 1705 02/13/17 2300      Medications: Scheduled Meds: . buprenorphine-naloxone  2 tablet Sublingual Daily  . DULoxetine  60 mg Oral BID  . enoxaparin (LOVENOX) injection  40 mg Subcutaneous Q24H  . feeding supplement (ENSURE ENLIVE)  237 mL Oral BID BM  . ferrous sulfate  325 mg Oral TID WC  . multivitamin with minerals  1 tablet Oral Daily  . OLANZapine  5 mg Oral q morning - 10a  . polyethylene glycol  17 g Oral Daily  . senna-docusate  1 tablet Oral BID   Continuous Infusions: . nafcillin IV 2 g (02/26/17 1508)   PRN Meds:.acetaminophen, ALPRAZolam, cyclobenzaprine, gadobenate dimeglumine, ondansetron **OR** ondansetron (ZOFRAN) IV, sodium chloride flush    Objective: Weight change:   Intake/Output Summary (Last 24 hours) at 02/26/2017 1659 Last  data filed at 02/26/2017 1336 Gross per 24 hour  Intake 1078 ml  Output 300 ml  Net 778 ml   Blood pressure 94/64, pulse 76, temperature (!) 97.5 F (36.4 C), resp. rate 18, height 5\' 7"  (1.702 m), weight 177 lb 14.6 oz (80.7 kg), last menstrual period 01/28/2017, SpO2 100 %. Temp:  [97.5 F (36.4 C)-98.8 F (37.1 C)] 97.5 F (36.4 C) (02/13 1334) Pulse Rate:  [74-88] 76 (02/13 1334) Resp:  [17-18] 18 (02/13 1334) BP: (90-105)/(52-66) 94/64 (02/13 1334) SpO2:  [98 %-100 %] 100 % (02/13 1334)  Physical Exam: General: Alert and awake, oriented x3,and dysphoric HEENT: anicteric sclera,EOMI CVS regular rate, normal r,  no murmur rubs or gallops Chest: clear to auscultation bilaterally, no wheezing, rales or rhonchi Abdomen: soft nontender,  nondistended, normal bowel sounds, Extremities: She has reduced range of motion in her shoulder and still tenderness about the clavicle Skin: rashes resolving on feet  Neuro: she does still have weakness in lower extremities  CBC:  CBC Latest Ref Rng & Units 02/26/2017 02/25/2017 02/24/2017  WBC 4.0 - 10.5 K/uL 8.3 8.4 6.9  Hemoglobin 12.0 - 15.0 g/dL 1.6(X) 0.9(U) 0.4(V)  Hematocrit 36.0 - 46.0 % 27.0(L) 25.2(L) 25.4(L)  Platelets 150 - 400 K/uL 513(H) 524(H) 488(H)      BMET Recent Labs    02/25/17 0410 02/26/17 0448  NA 135 136  K 3.9 4.6  CL 98* 101  CO2 25 23  GLUCOSE 87 96  BUN 11 9  CREATININE 0.77 0.86  CALCIUM 8.7* 8.7*     Liver Panel  No results for input(s): PROT, ALBUMIN, AST, ALT, ALKPHOS, BILITOT, BILIDIR, IBILI in the last 72 hours.     Sedimentation Rate Recent Labs    02/24/17 1914  ESRSEDRATE 136*   C-Reactive Protein Recent Labs    02/24/17 1209  CRP 7.5*    Micro Results: Recent Results (from the past 720 hour(s))  Culture, blood (Routine x 2)     Status: Abnormal   Collection Time: 02/13/17  4:00 PM  Result Value Ref Range Status   Specimen Description BLOOD RIGHT ANTECUBITAL  Final   Special Requests IN PEDIATRIC BOTTLE Blood Culture adequate volume  Final   Culture  Setup Time   Final    GRAM POSITIVE COCCI IN PEDIATRIC BOTTLE CRITICAL RESULT CALLED TO, READ BACK BY AND VERIFIED WITHMelven Sartorius [facility] [id number] 02/14/17 A BROWNING Performed at [facility], [address], [place], [place] [address]    Culture STAPHYLOCOCCUS AUREUS (A)  Final   Report Status 02/17/2017 FINAL  Final   Organism ID, Bacteria STAPHYLOCOCCUS AUREUS  Final      Susceptibility   Staphylococcus aureus - MIC*    CIPROFLOXACIN <=0.5 SENSITIVE Sensitive     ERYTHROMYCIN <=0.25 SENSITIVE Sensitive     GENTAMICIN <=0.5 SENSITIVE Sensitive     OXACILLIN <=0.25 SENSITIVE Sensitive     TETRACYCLINE >=16 RESISTANT Resistant     VANCOMYCIN 1 SENSITIVE  Sensitive     TRIMETH/SULFA <=10 SENSITIVE Sensitive     CLINDAMYCIN <=0.25 SENSITIVE Sensitive     RIFAMPIN <=0.5 SENSITIVE Sensitive     Inducible Clindamycin NEGATIVE Sensitive     * STAPHYLOCOCCUS AUREUS  Blood Culture ID Panel (Reflexed)     Status: Abnormal   Collection Time: 02/13/17  4:00 PM  Result Value Ref Range Status   Enterococcus species NOT DETECTED NOT DETECTED Final   Listeria monocytogenes NOT DETECTED NOT DETECTED Final   Staphylococcus  species DETECTED (A) NOT DETECTED Final    Comment: CRITICAL RESULT CALLED TO, READ BACK BY AND VERIFIED WITH: J [facility] PHARMD [id number] 02/14/17 A BROWNING    Staphylococcus aureus DETECTED (A) NOT DETECTED Final    Comment: Methicillin (oxacillin) susceptible Staphylococcus aureus (MSSA). Preferred therapy is anti staphylococcal beta lactam antibiotic (Cefazolin or Nafcillin), unless clinically contraindicated. CRITICAL RESULT CALLED TO, READ BACK BY AND VERIFIED WITH: Melven Sartorius PHARMD [id number] 02/14/17 A BROWNING    Methicillin resistance NOT DETECTED NOT DETECTED Final   Streptococcus species NOT DETECTED NOT DETECTED Final   Streptococcus agalactiae NOT DETECTED NOT DETECTED Final   Streptococcus pneumoniae NOT DETECTED NOT DETECTED Final   Streptococcus pyogenes NOT DETECTED NOT DETECTED Final   Acinetobacter baumannii NOT DETECTED NOT DETECTED Final   Enterobacteriaceae species NOT DETECTED NOT DETECTED Final   Enterobacter cloacae complex NOT DETECTED NOT DETECTED Final   Escherichia coli NOT DETECTED NOT DETECTED Final   Klebsiella oxytoca NOT DETECTED NOT DETECTED Final   Klebsiella pneumoniae NOT DETECTED NOT DETECTED Final   Proteus species NOT DETECTED NOT DETECTED Final   Serratia marcescens NOT DETECTED NOT DETECTED Final   Haemophilus influenzae NOT DETECTED NOT DETECTED Final   Neisseria meningitidis NOT DETECTED NOT DETECTED Final   Pseudomonas aeruginosa NOT DETECTED NOT DETECTED Final   Candida albicans NOT DETECTED NOT  DETECTED Final   Candida glabrata NOT DETECTED NOT DETECTED Final   Candida krusei NOT DETECTED NOT DETECTED Final   Candida parapsilosis NOT DETECTED NOT DETECTED Final   Candida tropicalis NOT DETECTED NOT DETECTED Final    Comment: Performed at [facility], [address] [place], [place] [address]  Culture, blood (Routine x 2)     Status: Abnormal   Collection Time: 02/13/17  5:00 PM  Result Value Ref Range Status   Specimen Description BLOOD RIGHT ANTECUBITAL  Final   Special Requests   Final    BOTTLES DRAWN AEROBIC AND ANAEROBIC Blood Culture adequate volume   Culture  Setup Time   Final    GRAM POSITIVE COCCI IN CLUSTERS IN BOTH AEROBIC AND ANAEROBIC BOTTLES CRITICAL VALUE NOTED.  VALUE IS CONSISTENT WITH PREVIOUSLY REPORTED AND CALLED VALUE.    Culture (A)  Final    STAPHYLOCOCCUS AUREUS SUSCEPTIBILITIES PERFORMED ON PREVIOUS CULTURE WITHIN THE LAST 5 DAYS. Performed at [facility], [address], [place], [place] [address]    Report Status 02/16/2017 FINAL  Final  MRSA PCR Screening     Status: None   Collection Time: 02/13/17  9:32 PM  Result Value Ref Range Status   MRSA by PCR NEGATIVE NEGATIVE Final    Comment:        The GeneXpert MRSA Assay (FDA approved for NASAL specimens only), is one component of a comprehensive MRSA colonization surveillance program. It is not intended to diagnose MRSA infection nor to guide or monitor treatment for MRSA infections.   Culture, blood (Routine X 2) w Reflex to ID Panel     Status: Abnormal   Collection Time: 02/14/17 10:07 AM  Result Value Ref Range Status   Specimen Description BLOOD RIGHT HAND  Final   Special Requests IN PEDIATRIC BOTTLE Blood Culture adequate volume  Final   Culture  Setup Time   Final    GRAM POSITIVE COCCI IN PEDIATRIC BOTTLE CRITICAL VALUE NOTED.  VALUE IS CONSISTENT WITH PREVIOUSLY REPORTED AND CALLED VALUE.    Culture (A)  Final    STAPHYLOCOCCUS AUREUS SUSCEPTIBILITIES  PERFORMED  ON PREVIOUS CULTURE WITHIN THE LAST 5 DAYS. Performed at [facility], [address], [place], [place] [address]    Report Status 02/17/2017 FINAL  Final  Culture, blood (Routine X 2) w Reflex to ID Panel     Status: Abnormal   Collection Time: 02/17/17 10:10 AM  Result Value Ref Range Status   Specimen Description BLOOD RIGHT HAND  Final   Special Requests IN PEDIATRIC BOTTLE Blood Culture adequate volume  Final   Culture  Setup Time   Final    GRAM POSITIVE COCCI IN PEDIATRIC BOTTLE CRITICAL VALUE NOTED.  VALUE IS CONSISTENT WITH PREVIOUSLY REPORTED AND CALLED VALUE.    Culture (A)  Final    STAPHYLOCOCCUS AUREUS SUSCEPTIBILITIES PERFORMED ON PREVIOUS CULTURE WITHIN THE LAST 5 DAYS. Performed at [facility], [address] [place], [place] [address]    Report Status 02/19/2017 FINAL  Final  Culture, blood (Routine X 2) w Reflex to ID Panel     Status: None   Collection Time: 02/17/17 10:15 AM  Result Value Ref Range Status   Specimen Description BLOOD LEFT ANTECUBITAL  Final   Special Requests IN PEDIATRIC BOTTLE Blood Culture adequate volume  Final   Culture   Final    NO GROWTH 5 DAYS Performed at [facility], [address] [place], [place] [address]    Report Status 02/22/2017 FINAL  Final  Culture, blood (Routine X 2) w Reflex to ID Panel     Status: None   Collection Time: 02/19/17  9:15 AM  Result Value Ref Range Status   Specimen Description BLOOD LEFT ANTECUBITAL  Final   Special Requests   Final    BOTTLES DRAWN AEROBIC AND ANAEROBIC Blood Culture adequate volume   Culture   Final    NO GROWTH 5 DAYS Performed at [facility], [address], [place], [place] [address]    Report Status 02/24/2017 FINAL  Final  Culture, blood (Routine X 2) w Reflex to ID Panel     Status: None   Collection Time: 02/19/17  9:30 AM  Result Value Ref Range Status   Specimen Description BLOOD RIGHT ANTECUBITAL  Final   Special Requests   Final     BOTTLES DRAWN AEROBIC AND ANAEROBIC Blood Culture adequate volume   Culture   Final    NO GROWTH 5 DAYS Performed at [facility], [address], [place], [place] [address]    Report Status 02/24/2017 FINAL  Final    Studies/Results: Ct Angio Chest Pe W Or Wo Contrast  Result Date: 02/25/2017 CLINICAL DATA:  Acute onset of pleuritic chest pain and shortness of breath. Elevated D-dimer. EXAM: CT ANGIOGRAPHY CHEST WITH CONTRAST TECHNIQUE: Multidetector CT imaging of the chest was performed using the standard protocol during bolus administration of intravenous contrast. Multiplanar CT image reconstructions and MIPs were obtained to evaluate the vascular anatomy. CONTRAST:  ISOVUE-370 IOPAMIDOL (ISOVUE-370) INJECTION 76% COMPARISON:  Chest radiograph performed 02/23/2016, and CT of the chest performed 02/20/2017 FINDINGS: Cardiovascular:  There is no evidence of pulmonary embolus. The heart is borderline enlarged. The thoracic aorta is grossly unremarkable in appearance. The great vessels are within normal limits. Mediastinum/Nodes: A 1.5 cm azygoesophageal recess node is noted. Visualized hilar nodes are borderline normal in size. No pericardial effusion is identified. Incidental note is made of a retroesophageal right subclavian artery. The visualized portions of the thyroid gland are unremarkable. No axillary lymphadenopathy is appreciated. Lungs/Pleura:  Patchy bilateral airspace opacities raise concern for pneumonia, most prominent at the lower lobes bilaterally. Scattered peripheral nodular opacities are noted throughout both lungs. This is slightly improved from the prior study. There is a 2.9 cm cavitary focus at the right lung base, concerning for atypical infection. No pleural effusion or pneumothorax is seen. Upper Abdomen: The visualized portions of the liver and spleen are unremarkable. Musculoskeletal: No acute osseous abnormalities are identified. The visualized musculature is  unremarkable in appearance. Review of the MIP images confirms the above findings. IMPRESSION: 1. No evidence of pulmonary embolus. 2. As on the recent prior CT, bilateral pneumonia is again noted. Peripheral nodular opacities are seen throughout both lungs, slightly improved from the prior study. 2.9 cm cavitary focus at the right lung base is better characterized, and is concerning for atypical infection. 3. Enlarged 1.5 cm azygoesophageal recess node. Electronically Signed   By: Roanna RaiderJeffery  Chang M.D.   On: 02/25/2017 04:52      Assessment/Plan:  INTERVAL HISTORY:   Patient evaluated by Dr. Criselda PeachesMullen   Principal Problem:   MSSA bacteremia Active Problems:   Sepsis affecting skin (HCC)   Pelvic pain   Cellulitis of left hand   Staphylococcus aureus bacteremia with sepsis (HCC)   Weakness of both lower extremities   Pain of right clavicle   IVDU (intravenous drug user)   Septic arthritis of right sternoclavicular joint (HCC)   PFO (patent foramen ovale)   Tricuspid valve vegetation    [name] is a 32 y.o. 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  Specific aspects of clinical management are critical to optimizing the outcome of patients with SAB.  Therefore, the [facility] Team [facility]) has initiated an intervention aimed at improving the management of SAB at [facility].  To do so, Infectious Diseases physicians are providing an evidence-based consult for the management of all patients with SAB.     Yes No Comments  Perform follow-up blood cultures (even if the patient is afebrile) to ensure clearance of bacteremia [x]  []  02/19/17 culture NO GROWTH  x 5 days  Remove vascular catheter and obtain follow-up blood cultures after the removal of the catheter []  []  Would not place PICC until DISPO is clear  Perform echocardiography to evaluate for endocarditis (transthoracic ECHO is 40-50% sensitive, TEE is > 90% sensitive) []  []  Please keep in mind, that neither test can definitively EXCLUDE endocarditis, and that should clinical suspicion remain high for endocarditis the patient should then still be treated with an "endocarditis" duration of therapy = 6 weeks  She has vegetation with possible perforation on TV w mod to severe TR and PFO  Consult electrophysiologist to evaluate implanted cardiac device (pacemaker, ICD) []  []    Ensure source control []  []  Have all abscesses been drained effectively? Have deep seeded infections (septic joints or osteomyelitis) had appropriate surgical debridement?  SHE HAS BEEN SEEN BY CT SURGERY  Investigate for "metastatic" sites of infection []  []  Does the patient have ANY symptom or physical exam finding that would suggest a deeper infection (back or neck pain that may be suggestive of vertebral osteomyelitis or epidural abscess, muscle pain that could be a symptom of  pyomyositis)?  Keep in mind that for deep seeded infections MRI imaging with contrast is preferred rather than other often insensitive tests such as plain x-rays, especially early in a patient's presentation.   MRI brain was without septic emboli  Shoulder pain I will get an MRI of the shoulder and also want to include the clavicle in this imaging.  Changed antibiotic therapy to NAFCILLIN GIVEN PFO AND CONCERN ABOUT SEPTIC EMBOLI TO CNS  []  []  Beta-lactam antibiotics are preferred for MSSA due to higher cure rates.   If on Vancomycin, goal trough should be 15 - 20 mcg/mL  Estimated duration of IV antibiotic therapy:  8 WEEKS ISSUE WILL BE IS SHE  IF  WILLING TO STAY IN THE HOSPITAL TO GET IV ANTIBIOTICS   []  []  Consult case management for  probably prolonged outpatient IV antibiotic therapy   #2 Endocarditis 1.8 x 1.7 cm  Vegetation with possible perforation on TV with large PFO:  Larger concern is risk of embolization across the PFO to CNS or kidneys in the left sided circulation that could occur even if vegetation is sterile. Dr. Tyrone Sage does not recommend TVR in context of her ongoing drug use. Family is unhappy with this but this is a very difficult clinical situation in which Dr. Tyrone Sage has to weigh risks/benefits.   I do think it is possible patient might be able to be treated with IV antibiotics and get effective treatment. Certainly her blood cultures have sterilized. Now I cannot guarantee nor can anyone that the vegetation might not embolize across PFO into left sided circulation even if it is sterilized  If surgery is not pursued would recommend repeat TTE and likely repeat TEE to assess vegetation.   Patients family had been requesting transfer to [facility].  Regardless of where she might be the risk of her doing further IVDU and infected a new prosthetic valve will remain and need to be balanced vs urgency of doing surgery in current situation with large right sided vegetation with PFO  I am going to keep her on nafcillin for now rather than switch to cefazolin just yet  #3 [place] septic arthritis:  I discussed with Dr. Tyrone Sage who was not impressed with ehr [place] joint and did not want to intercede. 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 Criselda Peaches seeing the patient yesterday  #5 Shoulder pain on right: getting MRI of the shoulder     LOS: 13 days   Acey Lav 02/26/2017, 4:59 PM

## 2017-02-26 NOTE — Plan of Care (Signed)
Progressing

## 2017-02-26 NOTE — Progress Notes (Signed)
PROGRESS NOTE    Tammy ArabCasey M [name]  ZOX:[id number] DOB: 09/23/1985 DOA: 02/13/2017 PCP: Pearson GrippeKim, James, MD    Brief Narrative:  32 year old female who presented with a chief complaint weakness.  She does have a significant past medical history for fibromyalgia, chronic anemia and history intravenous drug abuse.  Complaint of fever, chills, generalized weakness for the last 2 days prior to hospitalization.  She had erythema and petechiae on her left hand and bilateral lower extremities.  On initial physical examination blood pressure 86/52, heart rate 70, temperature 100.7, respiratory 22 and oxygen saturation 97%.  Lungs were clear to auscultation bilaterally, no wheezing, rales or rhonchi, heart S1-S2 present, rhythmic, no gallops, rubs or murmurs, the abdomen was soft nontender, lower extremities with edema and petechiae lesions.  Sodium 130, potassium 3.7, sodium 88, bicarb 27, glucose 107, BUN 9, creatinine 0.72, white count 14.4, hemoglobin 11.5, hematocrit 33.1, platelets 109.  Urinalysis too numerous to count RBCs, 6-30 white cells, chest x-ray had bibasilar atelectasis, EKG sinus rhythm, 128 bpm, normal axis, normal intervals.  Patient was admitted to the hospital and diagnosis of sepsis, due to left hand cellulitis, rule out endocarditis.   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 Will continue antibiotic therapy with nafcillin. Total course 8 weeks.   2. Right sterno-clavicular septic arthritis. Will continue  antibiotic therapy, close follow up. No signs of worsening.   3. IV drug abuse. No signs of withdrawal, will continue close monitoring, with neuro checks per unit protocol.   4. Depression. Continue cymbalta and olanzapine.   DVT prophylaxis: enoxaparin  Code Status: full Family Communication: no family at the bedside Disposition Plan:  home   Consultants:     Procedures:     Antimicrobials:       Subjective: Patient feeling well, positive right shoulder pain, moderate in intensity with no radiation, no nausea or vomiting, no fever or chills, no dyspnea or chest pain. No lower extremity edema.   Objective: Vitals:   02/25/17 0524 02/25/17 1343 02/25/17 2203 02/26/17 0502  BP: (!) 102/54 (!) 90/53 (!) 90/52 105/66  Pulse: 66 76 88 74  Resp: 18 18 17 17   Temp: 98.4 F (36.9 C) 98 F (36.7 C) 98.8 F (37.1 C) 98.2 F (36.8 C)  TempSrc:  Oral Oral Oral  SpO2: 99% 100% 98% 98%  Weight:      Height:        Intake/Output Summary (Last 24 hours) at 02/26/2017 1225 Last data filed at 02/26/2017 0603 Gross per 24 hour  Intake 740 ml  Output 600 ml  Net 140 ml   Filed Weights   02/13/17 2120 02/17/17 1500  Weight: 76.5 kg (168 lb 10.4 oz) 80.7 kg (177 lb 14.6 oz)    Examination:   General: Not in pain or dyspnea, deconditioned Neurology: Awake and alert, non focal  E ENT: mild pallor, no icterus, oral mucosa moist Cardiovascular: No JVD. S1-S2 present, rhythmic, no gallops, rubs, or murmurs. No lower extremity edema.  Pulmonary: vesicular breath sounds bilaterally, adequate air movement, no wheezing, rhonchi or rales. Gastrointestinal. Abdomen flat, no organomegaly, non tender, no rebound or guarding Skin. No rashes Musculoskeletal: no joint deformities     Data Reviewed: I have personally reviewed following labs and imaging studies  CBC: Recent Labs  Lab 02/22/17 0212 02/23/17 0230 02/24/17 0406 02/25/17 0410 02/26/17 0448  WBC 9.4 8.6 6.9 8.4 8.3    NEUTROABS 5.5 5.5 3.9 5.3 4.5  HGB 8.1* 7.9* 8.2* 8.0* 8.6*  HCT 24.8* 24.3* 25.4* 25.2* 27.0*  MCV 88.3 88.7 88.2 89.0 89.7  PLT 364 413* 488* 524* 513*   Basic Metabolic Panel: Recent Labs  Lab 02/22/17 0212 02/23/17 0230 02/24/17 0406 02/25/17 0410 02/26/17 0448  NA 132* 132* 135 135 136  K 4.2 3.4* 3.7 3.9 4.6  CL 98* 98* 99* 98* 101  CO2 21* 21* 24 25 23   GLUCOSE 90 122* 98 87 96  BUN 13 11 12 11 9   CREATININE 0.74 0.86 0.87 0.77 0.86  CALCIUM 8.7* 8.6* 8.8* 8.7* 8.7*   GFR: Estimated Creatinine Clearance: 102.6 mL/min (by C-G formula based on SCr of 0.86 mg/dL). Liver Function Tests: No results for input(s): AST, ALT, ALKPHOS, BILITOT, PROT, ALBUMIN in the last 168 hours. No results for input(s): LIPASE, AMYLASE in the last 168 hours. No results for input(s): AMMONIA in the last 168 hours. Coagulation Profile: No results for input(s): INR, PROTIME in the last 168 hours. Cardiac Enzymes: No results for input(s): CKTOTAL, CKMB, CKMBINDEX, TROPONINI in the last 168 hours. BNP (last 3 results) No results for input(s): PROBNP in the last 8760 hours. HbA1C: No results for input(s): HGBA1C in the last 72 hours. CBG: No results for input(s): GLUCAP in the last 168 hours. Lipid Profile: No results for input(s): CHOL, HDL, LDLCALC, TRIG, CHOLHDL, LDLDIRECT in the last 72 hours. Thyroid Function Tests: No results for input(s): TSH, T4TOTAL, FREET4, T3FREE, THYROIDAB in the last 72 hours. Anemia Panel: No results for input(s): VITAMINB12, FOLATE, FERRITIN, TIBC, IRON, RETICCTPCT in the last 72 hours.    Radiology Studies: I have reviewed all of the imaging during this hospital visit personally     Scheduled Meds: . buprenorphine-naloxone  2 tablet Sublingual Daily  . DULoxetine  60 mg Oral BID  . enoxaparin (LOVENOX) injection  40 mg Subcutaneous Q24H  . feeding supplement (ENSURE ENLIVE)  237 mL Oral BID BM  . ferrous sulfate  325 mg Oral TID WC  .  multivitamin with minerals  1 tablet Oral Daily  . OLANZapine  5 mg Oral q morning - 10a  . polyethylene glycol  17 g Oral Daily  . senna-docusate  1 tablet Oral BID   Continuous Infusions: . nafcillin IV 2 g (02/26/17 0947)     LOS: 13 days        [name] Tammy Gula, MD [facility] Pager [phone]

## 2017-02-26 NOTE — Progress Notes (Signed)
Nutrition Follow-up  DOCUMENTATION CODES:   Not applicable  INTERVENTION:  Continue Ensure Enlive po BID, each supplement provides 350 kcal and 20 grams of protein  Recommend daily weights  NUTRITION DIAGNOSIS:   Inadequate oral intake related to lethargy/confusion, poor appetite as evidenced by meal completion < 50%. -improving  GOAL:   Patient will meet greater than or equal to 90% of their needs -progressing  MONITOR:   PO intake, Supplement acceptance, Labs, Weight trends, Skin, I & O's  ASSESSMENT:   32 y.o. female w/ a hx of fibromyalgia, anemia, and IV drug abuse who presented with a few days history of fever, chills, generalized weakness muscle aches, and arthralgias.  In the emergency room the patient was noted to have fever, leukocytosis and left hand and bilateral lower extremity redness and petechiae.     TC Valve endocarditis, MSSA bacteremia, PFO, R Sternoclavicular joint septic arthritis, on IV ABx per ID  Ate 100% for one meal yesterday but still getting food from outside, does not like our food. Consuming 1-2 ensure depending on appetite.  Labs reviewed Medications reviewed and include:  Iron, Senokot-S, Miralax IV nafcillin   Diet Order:  Diet Heart Room service appropriate? Yes; Fluid consistency: Thin  EDUCATION NEEDS:   Education needs have been addressed  Skin:  Skin Assessment: Reviewed RN Assessment  Last BM:  02/25/2017  Height:   Ht Readings from Last 1 Encounters:  02/17/17 5\' 7"  (1.702 m)    Weight:   Wt Readings from Last 1 Encounters:  02/17/17 177 lb 14.6 oz (80.7 kg)    Ideal Body Weight:  61.4 kg  BMI:  Body mass index is 27.86 kg/m.  Estimated Nutritional Needs:   Kcal:  1700-1900  Protein:  90-105 grams  Fluid:  1.7-1.9 L  Dionne AnoWilliam M. Yazeed Pryer, MS, RD LDN Inpatient Clinical Dietitian Pager [phone]

## 2017-02-26 NOTE — Progress Notes (Signed)
Ref: Pearson Grippe, MD   Subjective:  Feeling better. Afebrile.   Objective:  Vital Signs in the last 24 hours: Temp:  [97.5 F (36.4 C)-98.8 F (37.1 C)] 97.5 F (36.4 C) (02/13 1334) Pulse Rate:  [74-88] 76 (02/13 1334) Cardiac Rhythm: Normal sinus rhythm (02/13 1000) Resp:  [17-18] 18 (02/13 1334) BP: (90-105)/(52-66) 94/64 (02/13 1334) SpO2:  [98 %-100 %] 100 % (02/13 1334)  Physical Exam: BP Readings from Last 1 Encounters:  02/26/17 94/64     Wt Readings from Last 1 Encounters:  02/17/17 80.7 kg (177 lb 14.6 oz)    Weight change:  Body mass index is 27.86 kg/m. HEENT: [place]/AT, Eyes-Blue, PERL, EOMI, Conjunctiva-Pale, Sclera-Non-icteric Neck: No JVD, No bruit, Trachea midline. Lungs:  Clear, Bilateral. Cardiac:  Regular rhythm, normal S1 and S2, no S3. III/VI systolic murmur. Abdomen:  Soft, non-tender. BS present. Extremities:  No edema present. No cyanosis. No clubbing. CNS: AxOx3, Cranial nerves grossly intact, moves all 4 extremities.  Skin: Warm and dry.   Intake/Output from previous day: 02/12 0701 - 02/13 0700 In: 1080 [P.O.:780; IV Piggyback:300] Out: 1200 [Urine:1200]    Lab Results: BMET    Component Value Date/Time   NA 136 02/26/2017 0448   NA 135 02/25/2017 0410   NA 135 02/24/2017 0406   K 4.6 02/26/2017 0448   K 3.9 02/25/2017 0410   K 3.7 02/24/2017 0406   CL 101 02/26/2017 0448   CL 98 (L) 02/25/2017 0410   CL 99 (L) 02/24/2017 0406   CO2 23 02/26/2017 0448   CO2 25 02/25/2017 0410   CO2 24 02/24/2017 0406   GLUCOSE 96 02/26/2017 0448   GLUCOSE 87 02/25/2017 0410   GLUCOSE 98 02/24/2017 0406   BUN 9 02/26/2017 0448   BUN 11 02/25/2017 0410   BUN 12 02/24/2017 0406   CREATININE 0.86 02/26/2017 0448   CREATININE 0.77 02/25/2017 0410   CREATININE 0.87 02/24/2017 0406   CALCIUM 8.7 (L) 02/26/2017 0448   CALCIUM 8.7 (L) 02/25/2017 0410   CALCIUM 8.8 (L) 02/24/2017 0406   GFRNONAA >60 02/26/2017 0448   GFRNONAA >60 02/25/2017 0410   GFRNONAA >60 02/24/2017 0406   GFRAA >60 02/26/2017 0448   GFRAA >60 02/25/2017 0410   GFRAA >60 02/24/2017 0406   CBC    Component Value Date/Time   WBC 8.3 02/26/2017 0448   RBC 3.01 (L) 02/26/2017 0448   HGB 8.6 (L) 02/26/2017 0448   HCT 27.0 (L) 02/26/2017 0448   PLT 513 (H) 02/26/2017 0448   MCV 89.7 02/26/2017 0448   MCH 28.6 02/26/2017 0448   MCHC 31.9 02/26/2017 0448   RDW 15.6 (H) 02/26/2017 0448   LYMPHSABS 2.9 02/26/2017 0448   MONOABS 0.8 02/26/2017 0448   EOSABS 0.2 02/26/2017 0448   BASOSABS 0.0 02/26/2017 0448   HEPATIC Function Panel Recent Labs    02/15/17 0247 02/16/17 0252 02/18/17 0503  PROT 5.3* 5.1* 6.1*   HEMOGLOBIN A1C No components found for: HGA1C,  MPG CARDIAC ENZYMES Lab Results  Component Value Date   TROPONINI <0.03 02/14/2017   TROPONINI <0.03 02/13/2017   TROPONINI <0.03 02/13/2017   BNP No results for input(s): PROBNP in the last 8760 hours. TSH No results for input(s): TSH in the last 8760 hours. CHOLESTEROL No results for input(s): CHOL in the last 8760 hours.  Scheduled Meds: . buprenorphine-naloxone  2 tablet Sublingual Daily  . DULoxetine  60 mg Oral BID  . enoxaparin (LOVENOX) injection  40 mg Subcutaneous Q24H  .  feeding supplement (ENSURE ENLIVE)  237 mL Oral BID BM  . ferrous sulfate  325 mg Oral TID WC  . multivitamin with minerals  1 tablet Oral Daily  . OLANZapine  5 mg Oral q morning - 10a  . polyethylene glycol  17 g Oral Daily  . senna-docusate  1 tablet Oral BID   Continuous Infusions: . nafcillin IV 2 g (02/26/17 1832)   PRN Meds:.acetaminophen, ALPRAZolam, cyclobenzaprine, gadobenate dimeglumine, ondansetron **OR** ondansetron (ZOFRAN) IV, sodium chloride flush  Assessment/Plan: MSSA bacteremia TV vegetation Polysubstance abuse PTSD Anemia Depression  Medical treatment.   LOS: 13 days    Orpah CobbAjay Giulliana Mcroberts  MD  02/26/2017, 8:05 PM

## 2017-02-27 ENCOUNTER — Inpatient Hospital Stay (HOSPITAL_COMMUNITY): Payer: Medicaid Other

## 2017-02-27 DIAGNOSIS — M898X1 Other specified disorders of bone, shoulder: Secondary | ICD-10-CM

## 2017-02-27 DIAGNOSIS — I071 Rheumatic tricuspid insufficiency: Secondary | ICD-10-CM

## 2017-02-27 LAB — BASIC METABOLIC PANEL
Anion gap: 10 (ref 5–15)
BUN: 9 mg/dL (ref 6–20)
CHLORIDE: 105 mmol/L (ref 101–111)
CO2: 24 mmol/L (ref 22–32)
CREATININE: 0.87 mg/dL (ref 0.44–1.00)
Calcium: 8.3 mg/dL — ABNORMAL LOW (ref 8.9–10.3)
GFR calc Af Amer: >60 mL/min (ref >60)
GFR calc non Af Amer: >60 mL/min (ref >60)
Glucose, Bld: 98 mg/dL (ref 65–99)
Potassium: 3.7 mmol/L (ref 3.5–5.1)
Sodium: 139 mmol/L (ref 135–145)

## 2017-02-27 LAB — CBC WITH DIFFERENTIAL/PLATELET
Basophils Absolute: 0 10*3/uL (ref 0.0–0.1)
Basophils Relative: 1 %
EOS ABS: 0.2 10*3/uL (ref 0.0–0.7)
Eosinophils Relative: 3 %
HEMATOCRIT: 24.8 % — AB (ref 36.0–46.0)
HEMOGLOBIN: 7.8 g/dL — AB (ref 12.0–15.0)
LYMPHS ABS: 2.8 10*3/uL (ref 0.7–4.0)
Lymphocytes Relative: 43 %
MCH: 28.4 pg (ref 26.0–34.0)
MCHC: 31.5 g/dL (ref 30.0–36.0)
MCV: 90.2 fL (ref 78.0–100.0)
MONO ABS: 0.5 10*3/uL (ref 0.1–1.0)
MONOS PCT: 7 %
NEUTROS ABS: 2.9 10*3/uL (ref 1.7–7.7)
NEUTROS PCT: 46 %
Platelets: 455 10*3/uL — ABNORMAL HIGH (ref 150–400)
RBC: 2.75 MIL/uL — ABNORMAL LOW (ref 3.87–5.11)
RDW: 15.7 % — AB (ref 11.5–15.5)
WBC: 6.4 10*3/uL (ref 4.0–10.5)

## 2017-02-27 MED ORDER — GADOBENATE DIMEGLUMINE 529 MG/ML IV SOLN: 15.0000 mL | Freq: Once | INTRAVENOUS | Status: DC

## 2017-02-27 NOTE — Progress Notes (Signed)
PROGRESS NOTE    Tammy BURGOON  ZOX:[id number] DOB: 06-01-1985 DOA: 02/13/2017 PCP: Pearson Grippe, MD    Brief Narrative:  32 year old female who presented with a chief complaint weakness. She does have a significant past medical history for fibromyalgia, chronic anemia and history intravenous drug abuse.Complaint of fever, chills, generalized weakness for the last 2 days prior to hospitalization.She had erythema and petechiae on her left hand and bilateral lower extremities.On initial physical examination blood pressure 86/52, heart rate 70, temperature 100.7, respiratory 22 and oxygen saturation 97%.Lungs wereclear to auscultation bilaterally, no wheezing, rales or rhonchi, heart S1-S2 present, rhythmic, no gallops, rubs or murmurs, the abdomen was soft nontender,lower extremities with edema and petechiae lesions.Sodium 130, potassium 3.7, sodium 88, bicarb 27, glucose 107, BUN 9, creatinine 0.72,white count 14.4, hemoglobin 11.5, hematocrit 33.1, platelets 109.Urinalysis too numerous to count RBCs, 6-30 white cells,chest x-ray had bibasilar atelectasis,EKG sinus rhythm, 128 bpm, normal axis, normal intervals.  Patient was admitted to the hospital and diagnosis of sepsis, due to left hand cellulitis, rule out endocarditis.   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 Total course 8 weeks. Tolerating well. Not candidate for outpatient IV antibiotic therapy due to history of IV drug abuse.   2. Right sterno-clavicular  septic arthritis. Persistent pain with active movement, joint with no erythema or edema. Shoulder MR ordered. Will continue flexeril.   3. IV drug abuse. Neuro checks per unit protocol, continue with no signs of withdrawal, on suboxone.   4. Depression. On cymbalta and olanzapine.   DVT prophylaxis: enoxaparin  Code Status: full Family Communication: no family at the bedside Disposition Plan:  home   Consultants:     Procedures:     Antimicrobials:      Subjective: Patient feeling well, positive right shoulder pain with active adduction, no nausea or vomiting, no chest pain.   Objective: Vitals:   02/26/17 1334 02/26/17 2200 02/27/17 0537 02/27/17 1422  BP: 94/64 (!) 88/51 104/64 103/74  Pulse: 76 65 63 83  Resp: 18 17 17 18   Temp: (!) 97.5 F (36.4 C) 98.2 F (36.8 C) (!) 97.2 F (36.2 C) 97.6 F (36.4 C)  TempSrc:      SpO2: 100% 97% 100% 100%  Weight:      Height:        Intake/Output Summary (Last 24 hours) at 02/27/2017 1436 Last data filed at 02/27/2017 0537 Gross per 24 hour  Intake 210 ml  Output 1000 ml  Net -790 ml   Filed Weights   02/13/17 2120 02/17/17 1500  Weight: 76.5 kg (168 lb 10.4 oz) 80.7 kg (177 lb 14.6 oz)    Examination:   General: Not in pain or dyspnea, deconditioned Neurology: Awake and alert, non focal  E ENT: mild pallor, no icterus, oral mucosa moist Cardiovascular: No JVD. S1-S2 present, rhythmic, no gallops, rubs, or murmurs. No lower extremity edema. Pulmonary: vesicular breath sounds bilaterally, adequate air movement, no wheezing, rhonchi or rales. Gastrointestinal. Abdomen flat, no organomegaly, non tender,  no rebound or guarding Skin. No rashes Musculoskeletal: no joint deformities     Data Reviewed: I have personally reviewed following labs and imaging studies  CBC: Recent Labs  Lab 02/23/17 0230 02/24/17 0406 02/25/17 0410 02/26/17 0448 02/27/17 0406  WBC 8.6 6.9 8.4 8.3 6.4  NEUTROABS 5.5  3.9 5.3 4.5 2.9  HGB 7.9* 8.2* 8.0* 8.6* 7.8*  HCT 24.3* 25.4* 25.2* 27.0* 24.8*  MCV 88.7 88.2 89.0 89.7 90.2  PLT 413* 488* 524* 513* 455*   Basic Metabolic Panel: Recent Labs  Lab 02/23/17 0230 02/24/17 0406 02/25/17 0410 02/26/17 0448 02/27/17 0406  NA 132* 135 135 136 139  K 3.4* 3.7 3.9 4.6 3.7  CL 98* 99* 98* 101 105  CO2 21* 24 25 23 24   GLUCOSE 122* 98 87 96 98  BUN 11 12 11 9 9   CREATININE 0.86 0.87 0.77 0.86 0.87  CALCIUM 8.6* 8.8* 8.7* 8.7* 8.3*   GFR: Estimated Creatinine Clearance: 101.4 mL/min (by C-G formula based on SCr of 0.87 mg/dL). Liver Function Tests: No results for input(s): AST, ALT, ALKPHOS, BILITOT, PROT, ALBUMIN in the last 168 hours. No results for input(s): LIPASE, AMYLASE in the last 168 hours. No results for input(s): AMMONIA in the last 168 hours. Coagulation Profile: No results for input(s): INR, PROTIME in the last 168 hours. Cardiac Enzymes: No results for input(s): CKTOTAL, CKMB, CKMBINDEX, TROPONINI in the last 168 hours. BNP (last 3 results) No results for input(s): PROBNP in the last 8760 hours. HbA1C: No results for input(s): HGBA1C in the last 72 hours. CBG: No results for input(s): GLUCAP in the last 168 hours. Lipid Profile: No results for input(s): CHOL, HDL, LDLCALC, TRIG, CHOLHDL, LDLDIRECT in the last 72 hours. Thyroid Function Tests: No results for input(s): TSH, T4TOTAL, FREET4, T3FREE, THYROIDAB in the last 72 hours. Anemia Panel: No results for input(s): VITAMINB12, FOLATE, FERRITIN, TIBC, IRON, RETICCTPCT in the last 72 hours.    Radiology Studies: I have reviewed all of the imaging during this hospital visit personally     Scheduled Meds: . buprenorphine-naloxone  2 tablet Sublingual Daily  . DULoxetine  60 mg Oral BID  . enoxaparin (LOVENOX) injection  40 mg Subcutaneous Q24H  . feeding supplement (ENSURE ENLIVE)  237 mL Oral BID BM  . ferrous sulfate  325 mg Oral TID WC  . multivitamin with minerals  1  tablet Oral Daily  . OLANZapine  5 mg Oral q morning - 10a  . polyethylene glycol  17 g Oral Daily  . senna-docusate  1 tablet Oral BID   Continuous Infusions: . nafcillin IV Stopped (02/27/17 1328)     LOS: 14 days        Mauricio Annett Gulaaniel Arrien, MD [facility] Pager [phone]

## 2017-02-27 NOTE — Progress Notes (Signed)
Physical Therapy Treatment Patient Details Name: Tammy ArabCasey M Bracco MRN: [id number] DOB: 07/27/1985 Today's Date: 02/27/2017    History of Present Illness Pt is a 32 y.o. female, with past medical history significant for fibromyalgia, anemia and history of IV drug abuse. She presented to the ED with a few days history of fever, chills, and generalized weakness muscle aches in addition to arthralgias.  She was admitted with diagnosis of sepsis.  Pt being treated for endocarditis.     PT Comments    Pt ambulated 200 ft in hall with min guard for safety and no AD. Pt with slow shuffling gait but overall steady. Advised pt to ambulate in halls between therapy sessions to maximize functional independence. Provided LE HEP for strengthening as pt is expected to be in the hospital for 6 weeks. Will continue to follow acutely to maximize activity tolerance and functional independence.    Follow Up Recommendations  Home health PT;Supervision/Assistance - 24 hour     Equipment Recommendations  Rolling walker with 5" wheels    Recommendations for Other Services Rehab consult     Precautions / Restrictions Precautions Precautions: Fall Restrictions Weight Bearing Restrictions: No    Mobility  Bed Mobility Overal bed mobility: Modified Independent Bed Mobility: Supine to Sit     Supine to sit: Modified independent (Device/Increase time);HOB elevated     General bed mobility comments: increased time secondary to pain and weakness  Transfers Overall transfer level: Needs assistance Equipment used: None Transfers: Sit to/from Stand Sit to Stand: Min guard         General transfer comment: min guar for safety  Ambulation/Gait Ambulation/Gait assistance: Min guard Ambulation Distance (Feet): 200 Feet Assistive device: None Gait Pattern/deviations: Step-through pattern;Decreased stride length;Shuffle Gait velocity: decreased Gait velocity interpretation: Below normal speed for  age/gender General Gait Details: Pt ambulated in hall with min guard for safety. No AD. Pt with step through pattern but shuffles feet. Cues to pick up feet.   Stairs            Wheelchair Mobility    Modified Rankin (Stroke Patients Only)       Balance Overall balance assessment: Needs assistance Sitting-balance support: No upper extremity supported;Feet supported Sitting balance-Leahy Scale: Good     Standing balance support: During functional activity Standing balance-Leahy Scale: Good Standing balance comment: stood to complete donning pants.                            Cognition Arousal/Alertness: Awake/alert Behavior During Therapy: WFL for tasks assessed/performed Overall Cognitive Status: Within Functional Limits for tasks assessed                                        Exercises General Exercises - Lower Extremity Ankle Circles/Pumps: AROM;Both;10 reps;Seated Long Arc Quad: AROM;Both;10 reps;Seated Mini-Sqauts: Strengthening;10 reps;Standing    General Comments        Pertinent Vitals/Pain Pain Assessment: Faces Faces Pain Scale: Hurts little more Pain Location: L shoulder Pain Descriptors / Indicators: Sore;Guarding;Grimacing Pain Intervention(s): Limited activity within patient's tolerance;Monitored during session;Repositioned    Home Living                      Prior Function            PT Goals (current goals can now be found in the  care plan section) Acute Rehab PT Goals Patient Stated Goal: get better PT Goal Formulation: With patient Time For Goal Achievement: 03/03/17 Potential to Achieve Goals: Good Progress towards PT goals: Progressing toward goals    Frequency    Min 3X/week      PT Plan Current plan remains appropriate    Co-evaluation              AM-PAC PT "6 Clicks" Daily Activity  Outcome Measure  Difficulty turning over in bed (including adjusting bedclothes, sheets  and blankets)?: None Difficulty moving from lying on back to sitting on the side of the bed? : None Difficulty sitting down on and standing up from a chair with arms (e.g., wheelchair, bedside commode, etc,.)?: None Help needed moving to and from a bed to chair (including a wheelchair)?: None Help needed walking in hospital room?: A Little Help needed climbing 3-5 steps with a railing? : A Little 6 Click Score: 22    End of Session Equipment Utilized During Treatment: Gait belt Activity Tolerance: Patient tolerated treatment well Patient left: in chair;with call bell/phone within reach Nurse Communication: Mobility status PT Visit Diagnosis: Muscle weakness (generalized) (M62.81);Difficulty in walking, not elsewhere classified (R26.2) Pain - Right/Left: Right(bilateral) Pain - part of body: Leg(groin)     Time: 1610-9604 PT Time Calculation (min) (ACUTE ONLY): 16 min  Charges:  $Gait Training: 8-22 mins                    G Codes:       Kallie Locks, [place] Pager [phone] Acute Rehab  Sheral Apley 02/27/2017, 10:38 AM

## 2017-02-27 NOTE — Consult Note (Signed)
Ref: Pearson Grippe, MD   Subjective:  No new complaints. VS stable.  Objective:  Vital Signs in the last 24 hours: Temp:  [97.2 F (36.2 C)-98.2 F (36.8 C)] 97.6 F (36.4 C) (02/14 1422) Pulse Rate:  [63-83] 83 (02/14 1422) Cardiac Rhythm: Normal sinus rhythm (02/14 0800) Resp:  [17-18] 18 (02/14 1422) BP: (88-104)/(51-74) 103/74 (02/14 1422) SpO2:  [97 %-100 %] 100 % (02/14 1422)  Physical Exam: BP Readings from Last 1 Encounters:  02/27/17 103/74     Wt Readings from Last 1 Encounters:  02/17/17 80.7 kg (177 lb 14.6 oz)    Weight change:  Body mass index is 27.86 kg/m. HEENT: [place]/AT, Eyes-Blue, PERL, EOMI, Conjunctiva-Pale, Sclera-Non-icteric Neck: No JVD, No bruit, Trachea midline. Lungs:  Clear, Bilateral. Cardiac:  Regular rhythm, normal S1 and S2, no S3. III/VI systolic murmur. Abdomen:  Soft, non-tender. BS present. Extremities:  No edema present. No cyanosis. No clubbing. CNS: AxOx3, Cranial nerves grossly intact, moves all 4 extremities.  Skin: Warm and dry.   Intake/Output from previous day: 02/13 0701 - 02/14 0700 In: 548 [P.O.:238; I.V.:10; IV Piggyback:300] Out: 1300 [Urine:1300]    Lab Results: BMET    Component Value Date/Time   NA 139 02/27/2017 0406   NA 136 02/26/2017 0448   NA 135 02/25/2017 0410   K 3.7 02/27/2017 0406   K 4.6 02/26/2017 0448   K 3.9 02/25/2017 0410   CL 105 02/27/2017 0406   CL 101 02/26/2017 0448   CL 98 (L) 02/25/2017 0410   CO2 24 02/27/2017 0406   CO2 23 02/26/2017 0448   CO2 25 02/25/2017 0410   GLUCOSE 98 02/27/2017 0406   GLUCOSE 96 02/26/2017 0448   GLUCOSE 87 02/25/2017 0410   BUN 9 02/27/2017 0406   BUN 9 02/26/2017 0448   BUN 11 02/25/2017 0410   CREATININE 0.87 02/27/2017 0406   CREATININE 0.86 02/26/2017 0448   CREATININE 0.77 02/25/2017 0410   CALCIUM 8.3 (L) 02/27/2017 0406   CALCIUM 8.7 (L) 02/26/2017 0448   CALCIUM 8.7 (L) 02/25/2017 0410   GFRNONAA >60 02/27/2017 0406   GFRNONAA >60 02/26/2017  0448   GFRNONAA >60 02/25/2017 0410   GFRAA >60 02/27/2017 0406   GFRAA >60 02/26/2017 0448   GFRAA >60 02/25/2017 0410   CBC    Component Value Date/Time   WBC 6.4 02/27/2017 0406   RBC 2.75 (L) 02/27/2017 0406   HGB 7.8 (L) 02/27/2017 0406   HCT 24.8 (L) 02/27/2017 0406   PLT 455 (H) 02/27/2017 0406   MCV 90.2 02/27/2017 0406   MCH 28.4 02/27/2017 0406   MCHC 31.5 02/27/2017 0406   RDW 15.7 (H) 02/27/2017 0406   LYMPHSABS 2.8 02/27/2017 0406   MONOABS 0.5 02/27/2017 0406   EOSABS 0.2 02/27/2017 0406   BASOSABS 0.0 02/27/2017 0406   HEPATIC Function Panel Recent Labs    02/15/17 0247 02/16/17 0252 02/18/17 0503  PROT 5.3* 5.1* 6.1*   HEMOGLOBIN A1C No components found for: HGA1C,  MPG CARDIAC ENZYMES Lab Results  Component Value Date   TROPONINI <0.03 02/14/2017   TROPONINI <0.03 02/13/2017   TROPONINI <0.03 02/13/2017   BNP No results for input(s): PROBNP in the last 8760 hours. TSH No results for input(s): TSH in the last 8760 hours. CHOLESTEROL No results for input(s): CHOL in the last 8760 hours.  Scheduled Meds: . buprenorphine-naloxone  2 tablet Sublingual Daily  . DULoxetine  60 mg Oral BID  . enoxaparin (LOVENOX) injection  40 mg  Subcutaneous Q24H  . feeding supplement (ENSURE ENLIVE)  237 mL Oral BID BM  . ferrous sulfate  325 mg Oral TID WC  . multivitamin with minerals  1 tablet Oral Daily  . OLANZapine  5 mg Oral q morning - 10a  . polyethylene glycol  17 g Oral Daily  . senna-docusate  1 tablet Oral BID   Continuous Infusions: . nafcillin IV Stopped (02/27/17 1727)   PRN Meds:.acetaminophen, ALPRAZolam, cyclobenzaprine, gadobenate dimeglumine, ondansetron **OR** ondansetron (ZOFRAN) IV, sodium chloride flush  Assessment/Plan: MSSA bacteremia TV vegetation Polysubstance abuse Anemia Depression  Re-consult as needed. F/U weekly and for TEE post antibiotic treatment.    LOS: 14 days    Orpah CobbAjay Rhys Anchondo  MD  02/27/2017, 8:15  PM

## 2017-02-27 NOTE — Progress Notes (Signed)
Subjective:  No new complaints,  Less pain in shoulder   Antibiotics:  Anti-infectives (From admission, onward)   Start     Dose/Rate Route Frequency Ordered Stop   02/24/17 1600  nafcillin 2 g in sodium chloride 0.9 % 100 mL IVPB     2 g 200 mL/hr over 30 Minutes Intravenous Every 4 hours 02/24/17 0833     02/21/17 2000  nafcillin 2 g in dextrose 5 % 100 mL IVPB  Status:  Discontinued     2 g 200 mL/hr over 30 Minutes Intravenous Every 4 hours 02/21/17 1741 02/24/17 0910   02/21/17 1730  nafcillin injection 2 g  Status:  Discontinued     2 g Intravenous Every 4 hours 02/21/17 1726 02/21/17 1740   02/19/17 1230  ceFAZolin (ANCEF) IVPB 2g/100 mL premix  Status:  Discontinued     2 g 200 mL/hr over 30 Minutes Intravenous Every 8 hours 02/19/17 1148 02/21/17 1726   02/18/17 1330  linezolid (ZYVOX) tablet 600 mg  Status:  Discontinued     600 mg Oral Every 12 hours 02/18/17 1250 02/19/17 1126   02/17/17 0800  sulfamethoxazole-trimethoprim (BACTRIM DS,SEPTRA DS) 800-160 MG per tablet 2 tablet  Status:  Discontinued     2 tablet Oral Every 12 hours 02/16/17 1453 02/18/17 1250   02/16/17 1600  sulfamethoxazole-trimethoprim (BACTRIM DS,SEPTRA DS) 800-160 MG per tablet 2 tablet     2 tablet Oral  Once 02/16/17 1509 02/16/17 1657   02/14/17 1400  ceFAZolin (ANCEF) IVPB 2g/100 mL premix  Status:  Discontinued     2 g 200 mL/hr over 30 Minutes Intravenous Every 8 hours 02/14/17 1013 02/16/17 1453   02/14/17 0200  vancomycin (VANCOCIN) IVPB 750 mg/150 ml premix  Status:  Discontinued     750 mg 150 mL/hr over 60 Minutes Intravenous Every 8 hours 02/13/17 1739 02/14/17 1017   02/14/17 0000  piperacillin-tazobactam (ZOSYN) IVPB 3.375 g  Status:  Discontinued     3.375 g 12.5 mL/hr over 240 Minutes Intravenous Every 8 hours 02/13/17 1739 02/14/17 1013   02/13/17 1715  piperacillin-tazobactam (ZOSYN) IVPB 3.375 g     3.375 g 100 mL/hr over 30 Minutes Intravenous  Once 02/13/17 1701  02/13/17 1824   02/13/17 1715  vancomycin (VANCOCIN) IVPB 1000 mg/200 mL premix  Status:  Discontinued     1,000 mg 200 mL/hr over 60 Minutes Intravenous  Once 02/13/17 1701 02/13/17 1705   02/13/17 1715  vancomycin (VANCOCIN) 1,500 mg in sodium chloride 0.9 % 500 mL IVPB     1,500 mg 250 mL/hr over 120 Minutes Intravenous  Once 02/13/17 1705 02/13/17 2300      Medications: Scheduled Meds: . buprenorphine-naloxone  2 tablet Sublingual Daily  . DULoxetine  60 mg Oral BID  . enoxaparin (LOVENOX) injection  40 mg Subcutaneous Q24H  . feeding supplement (ENSURE ENLIVE)  237 mL Oral BID BM  . ferrous sulfate  325 mg Oral TID WC  . multivitamin with minerals  1 tablet Oral Daily  . OLANZapine  5 mg Oral q morning - 10a  . polyethylene glycol  17 g Oral Daily  . senna-docusate  1 tablet Oral BID   Continuous Infusions: . nafcillin IV Stopped (02/27/17 1056)   PRN Meds:.acetaminophen, ALPRAZolam, cyclobenzaprine, gadobenate dimeglumine, ondansetron **OR** ondansetron (ZOFRAN) IV, sodium chloride flush    Objective: Weight change:   Intake/Output Summary (Last 24 hours) at 02/27/2017 1245 Last data filed at 02/27/2017 [phone]  Gross per 24 hour  Intake 430 ml  Output 1300 ml  Net -870 ml   Blood pressure 104/64, pulse 63, temperature (!) 97.2 F (36.2 C), resp. rate 17, height 5\' 7"  (1.702 m), weight 177 lb 14.6 oz (80.7 kg), last menstrual period 01/28/2017, SpO2 100 %. Temp:  [97.2 F (36.2 C)-98.2 F (36.8 C)] 97.2 F (36.2 C) (02/14 0537) Pulse Rate:  [63-76] 63 (02/14 0537) Resp:  [17-18] 17 (02/14 0537) BP: (88-104)/(51-64) 104/64 (02/14 0537) SpO2:  [97 %-100 %] 100 % (02/14 0537)  Physical Exam: General: Alert and awake, oriented x3,and dysphoric HEENT: anicteric sclera,EOMI CVS regular rate, normal r,  no murmur rubs or gallops Chest: clear to auscultation bilaterally, no wheezing, rales or rhonchi Abdomen: soft nontender, nondistended, normal bowel  sounds, Extremities: she has minimal tenderness of clavicle Skin: rashes resolving on feet  Neuro: she does still have weakness in lower extremities  CBC:  CBC Latest Ref Rng & Units 02/27/2017 02/26/2017 02/25/2017  WBC 4.0 - 10.5 K/uL 6.4 8.3 8.4  Hemoglobin 12.0 - 15.0 g/dL 7.8(L) 8.6(L) 8.0(L)  Hematocrit 36.0 - 46.0 % 24.8(L) 27.0(L) 25.2(L)  Platelets 150 - 400 K/uL 455(H) 513(H) 524(H)      BMET Recent Labs    02/26/17 0448 02/27/17 0406  NA 136 139  K 4.6 3.7  CL 101 105  CO2 23 24  GLUCOSE 96 98  BUN 9 9  CREATININE 0.86 0.87  CALCIUM 8.7* 8.3*     Liver Panel  No results for input(s): PROT, ALBUMIN, AST, ALT, ALKPHOS, BILITOT, BILIDIR, IBILI in the last 72 hours.     Sedimentation Rate Recent Labs    02/24/17 1914  ESRSEDRATE 136*   C-Reactive Protein No results for input(s): CRP in the last 72 hours.  Micro Results: Recent Results (from the past 720 hour(s))  Culture, blood (Routine x 2)     Status: Abnormal   Collection Time: 02/13/17  4:00 PM  Result Value Ref Range Status   Specimen Description BLOOD RIGHT ANTECUBITAL  Final   Special Requests IN PEDIATRIC BOTTLE Blood Culture adequate volume  Final   Culture  Setup Time   Final    GRAM POSITIVE COCCI IN PEDIATRIC BOTTLE CRITICAL RESULT CALLED TO, READ BACK BY AND VERIFIED WITHMelven Sartorius [facility] 2956 02/14/17 A BROWNING Performed at [facility], [address], [place], [place] [address]    Culture STAPHYLOCOCCUS AUREUS (A)  Final   Report Status 02/17/2017 FINAL  Final   Organism ID, Bacteria STAPHYLOCOCCUS AUREUS  Final      Susceptibility   Staphylococcus aureus - MIC*    CIPROFLOXACIN <=0.5 SENSITIVE Sensitive     ERYTHROMYCIN <=0.25 SENSITIVE Sensitive     GENTAMICIN <=0.5 SENSITIVE Sensitive     OXACILLIN <=0.25 SENSITIVE Sensitive     TETRACYCLINE >=16 RESISTANT Resistant     VANCOMYCIN 1 SENSITIVE Sensitive     TRIMETH/SULFA <=10 SENSITIVE Sensitive     CLINDAMYCIN  <=0.25 SENSITIVE Sensitive     RIFAMPIN <=0.5 SENSITIVE Sensitive     Inducible Clindamycin NEGATIVE Sensitive     * STAPHYLOCOCCUS AUREUS  Blood Culture ID Panel (Reflexed)     Status: Abnormal   Collection Time: 02/13/17  4:00 PM  Result Value Ref Range Status   Enterococcus species NOT DETECTED NOT DETECTED Final   Listeria monocytogenes NOT DETECTED NOT DETECTED Final   Staphylococcus species DETECTED (A) NOT DETECTED Final    Comment: CRITICAL RESULT CALLED TO, READ BACK  BY AND VERIFIED WITH: Melven Sartorius PHARMD [id number] 02/14/17 A BROWNING    Staphylococcus aureus DETECTED (A) NOT DETECTED Final    Comment: Methicillin (oxacillin) susceptible Staphylococcus aureus (MSSA). Preferred therapy is anti staphylococcal beta lactam antibiotic (Cefazolin or Nafcillin), unless clinically contraindicated. CRITICAL RESULT CALLED TO, READ BACK BY AND VERIFIED WITH: Melven Sartorius PHARMD [id number] 02/14/17 A BROWNING    Methicillin resistance NOT DETECTED NOT DETECTED Final   Streptococcus species NOT DETECTED NOT DETECTED Final   Streptococcus agalactiae NOT DETECTED NOT DETECTED Final   Streptococcus pneumoniae NOT DETECTED NOT DETECTED Final   Streptococcus pyogenes NOT DETECTED NOT DETECTED Final   Acinetobacter baumannii NOT DETECTED NOT DETECTED Final   Enterobacteriaceae species NOT DETECTED NOT DETECTED Final   Enterobacter cloacae complex NOT DETECTED NOT DETECTED Final   Escherichia coli NOT DETECTED NOT DETECTED Final   Klebsiella oxytoca NOT DETECTED NOT DETECTED Final   Klebsiella pneumoniae NOT DETECTED NOT DETECTED Final   Proteus species NOT DETECTED NOT DETECTED Final   Serratia marcescens NOT DETECTED NOT DETECTED Final   Haemophilus influenzae NOT DETECTED NOT DETECTED Final   Neisseria meningitidis NOT DETECTED NOT DETECTED Final   Pseudomonas aeruginosa NOT DETECTED NOT DETECTED Final   Candida albicans NOT DETECTED NOT DETECTED Final   Candida glabrata NOT DETECTED NOT DETECTED Final    Candida krusei NOT DETECTED NOT DETECTED Final   Candida parapsilosis NOT DETECTED NOT DETECTED Final   Candida tropicalis NOT DETECTED NOT DETECTED Final    Comment: Performed at [facility], [address] [place], [place] [address]  Culture, blood (Routine x 2)     Status: Abnormal   Collection Time: 02/13/17  5:00 PM  Result Value Ref Range Status   Specimen Description BLOOD RIGHT ANTECUBITAL  Final   Special Requests   Final    BOTTLES DRAWN AEROBIC AND ANAEROBIC Blood Culture adequate volume   Culture  Setup Time   Final    GRAM POSITIVE COCCI IN CLUSTERS IN BOTH AEROBIC AND ANAEROBIC BOTTLES CRITICAL VALUE NOTED.  VALUE IS CONSISTENT WITH PREVIOUSLY REPORTED AND CALLED VALUE.    Culture (A)  Final    STAPHYLOCOCCUS AUREUS SUSCEPTIBILITIES PERFORMED ON PREVIOUS CULTURE WITHIN THE LAST 5 DAYS. Performed at [facility], [address], [place], [place] [address]    Report Status 02/16/2017 FINAL  Final  MRSA PCR Screening     Status: None   Collection Time: 02/13/17  9:32 PM  Result Value Ref Range Status   MRSA by PCR NEGATIVE NEGATIVE Final    Comment:        The GeneXpert MRSA Assay (FDA approved for NASAL specimens only), is one component of a comprehensive MRSA colonization surveillance program. It is not intended to diagnose MRSA infection nor to guide or monitor treatment for MRSA infections.   Culture, blood (Routine X 2) w Reflex to ID Panel     Status: Abnormal   Collection Time: 02/14/17 10:07 AM  Result Value Ref Range Status   Specimen Description BLOOD RIGHT HAND  Final   Special Requests IN PEDIATRIC BOTTLE Blood Culture adequate volume  Final   Culture  Setup Time   Final    GRAM POSITIVE COCCI IN PEDIATRIC BOTTLE CRITICAL VALUE NOTED.  VALUE IS CONSISTENT WITH PREVIOUSLY REPORTED AND CALLED VALUE.    Culture (A)  Final    STAPHYLOCOCCUS AUREUS SUSCEPTIBILITIES PERFORMED ON PREVIOUS CULTURE WITHIN THE LAST 5 DAYS. Performed at [facility], [address] [place], [place] [address]    Report Status 02/17/2017 FINAL  Final  Culture, blood (Routine X 2) w Reflex to ID Panel     Status: Abnormal   Collection Time: 02/17/17 10:10 AM  Result Value Ref Range Status   Specimen Description BLOOD RIGHT HAND  Final   Special Requests IN PEDIATRIC BOTTLE Blood Culture adequate volume  Final   Culture  Setup Time   Final    GRAM POSITIVE COCCI IN PEDIATRIC BOTTLE CRITICAL VALUE NOTED.  VALUE IS CONSISTENT WITH PREVIOUSLY REPORTED AND CALLED VALUE.    Culture (A)  Final    STAPHYLOCOCCUS AUREUS SUSCEPTIBILITIES PERFORMED ON PREVIOUS CULTURE WITHIN THE LAST 5 DAYS. Performed at [facility], [address] [place], [place] [address]    Report Status 02/19/2017 FINAL  Final  Culture, blood (Routine X 2) w Reflex to ID Panel     Status: None   Collection Time: 02/17/17 10:15 AM  Result Value Ref Range Status   Specimen Description BLOOD LEFT ANTECUBITAL  Final   Special Requests IN PEDIATRIC BOTTLE Blood Culture adequate volume  Final   Culture   Final    NO GROWTH 5 DAYS Performed at [facility], [address] [place], [place] [address]    Report Status 02/22/2017 FINAL  Final  Culture, blood (Routine X 2) w Reflex to ID Panel     Status: None   Collection Time: 02/19/17  9:15 AM  Result Value Ref Range Status   Specimen Description BLOOD LEFT ANTECUBITAL  Final   Special Requests   Final    BOTTLES DRAWN AEROBIC AND ANAEROBIC Blood Culture adequate volume   Culture   Final    NO GROWTH 5 DAYS Performed at [facility], [address] [place], [place] [address]    Report Status 02/24/2017 FINAL  Final  Culture, blood (Routine X 2) w Reflex to ID Panel     Status: None   Collection Time: 02/19/17  9:30 AM  Result Value Ref Range Status   Specimen Description BLOOD RIGHT ANTECUBITAL  Final   Special Requests   Final    BOTTLES DRAWN AEROBIC AND ANAEROBIC Blood Culture adequate volume    Culture   Final    NO GROWTH 5 DAYS Performed at [facility], [address] [place], [place] [address]    Report Status 02/24/2017 FINAL  Final    Studies/Results: No results found.    Assessment/Plan:  INTERVAL HISTORY:   Awaiting MRI   Principal Problem:   MSSA bacteremia Active Problems:   Sepsis affecting skin (HCC)   Pelvic pain   Cellulitis of left hand   Staphylococcus aureus bacteremia with sepsis (HCC)   Weakness of both lower extremities   Pain of right clavicle   IVDU (intravenous drug user)   Septic arthritis of right sternoclavicular joint (HCC)   PFO (patent foramen ovale)   Tricuspid valve vegetation    [name] is a 32 y.o. 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  Specific aspects of clinical management are critical to optimizing the outcome of patients with SAB.  Therefore, the [facility] Antimicrobial Management Team (  [facility]) has initiated an intervention aimed at improving the management of SAB at [facility].  To do so, Infectious Diseases physicians are providing an evidence-based consult for the management of all patients with SAB.     Yes No Comments  Perform follow-up blood cultures (even if the patient is afebrile) to ensure clearance of bacteremia [x]  []  02/19/17 culture NO GROWTH x 5 days  Remove vascular catheter and obtain follow-up blood cultures after the removal of the catheter []  []  Would not place PICC until DISPO is clear  Perform echocardiography to evaluate for endocarditis (transthoracic ECHO is 40-50% sensitive, TEE is > 90% sensitive) []  []  Please keep in mind, that neither test can definitively EXCLUDE  endocarditis, and that should clinical suspicion remain high for endocarditis the patient should then still be treated with an "endocarditis" duration of therapy = 6 weeks  She has vegetation with possible perforation on TV w mod to severe TR and PFO  Consult electrophysiologist to evaluate implanted cardiac device (pacemaker, ICD) []  []    Ensure source control []  []  Have all abscesses been drained effectively? Have deep seeded infections (septic joints or osteomyelitis) had appropriate surgical debridement?  SHE HAS BEEN SEEN BY CT SURGERY  Investigate for "metastatic" sites of infection []  []  Does the patient have ANY symptom or physical exam finding that would suggest a deeper infection (back or neck pain that may be suggestive of vertebral osteomyelitis or epidural abscess, muscle pain that could be a symptom of pyomyositis)?  Keep in mind that for deep seeded infections MRI imaging with contrast is preferred rather than other often insensitive tests such as plain x-rays, especially early in a patient's presentation.   MRI brain was without septic emboli  Changed antibiotic therapy to NAFCILLIN GIVEN PFO AND CONCERN ABOUT SEPTIC EMBOLI TO CNS  []  []  Beta-lactam antibiotics are preferred for MSSA due to higher cure rates.   If on Vancomycin, goal trough should be 15 - 20 mcg/mL  Estimated duration of IV antibiotic therapy:  8 WEEKS ISSUE WILL BE IS SHE  IF  WILLING TO STAY IN THE HOSPITAL TO GET IV ANTIBIOTICS   []  []  Consult case management for probably prolonged outpatient IV antibiotic therapy   #2 Endocarditis 1.8 x 1.7 cm  Vegetation with possible perforation on TV with large PFO:  Larger concern is risk of embolization across the PFO to CNS or kidneys in the left sided circulation that could occur even if vegetation is sterile. Dr. Tyrone Sage does not recommend TVR in context of her ongoing drug use. Family is unhappy with this but this is a very difficult clinical situation in which Dr.  Tyrone Sage has to weigh risks/benefits.   I do think it is possible patient might be able to be treated with IV antibiotics and get effective treatment. Certainly her blood cultures have sterilized. Now I cannot guarantee nor can anyone that the vegetation might not embolize across PFO into left sided circulation even if it is sterilized  If surgery is not pursued would recommend repeat TTE and likely repeat TEE to assess vegetation.   Patients family had been requesting transfer to [facility].  Regardless of where she might be the risk of her doing further IVDU and infected a new prosthetic valve will remain and need to be balanced vs urgency of doing surgery in current situation with large right sided vegetation with PFO  I am going to keep her on nafcillin for now rather than switch to cefazolin just yet  #  3 [place] septic arthritis:  I discussed with Dr. Tyrone SageGerhardt who was not impressed with ehr [place] joint and did not want to intercede. 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 Criselda PeachesMullen seeing the patient today.       LOS: 14 days   Acey LavCornelius [place] 02/27/2017, 12:45 PM

## 2017-02-28 DIAGNOSIS — Z95828 Presence of other vascular implants and grafts: Secondary | ICD-10-CM

## 2017-02-28 LAB — BASIC METABOLIC PANEL
ANION GAP: 11 (ref 5–15)
BUN: 7 mg/dL (ref 6–20)
CHLORIDE: 102 mmol/L (ref 101–111)
CO2: 24 mmol/L (ref 22–32)
Calcium: 8.8 mg/dL — ABNORMAL LOW (ref 8.9–10.3)
Creatinine, Ser: 0.82 mg/dL (ref 0.44–1.00)
GFR calc Af Amer: >60 mL/min (ref >60)
GFR calc non Af Amer: >60 mL/min (ref >60)
Glucose, Bld: 97 mg/dL (ref 65–99)
Potassium: 3.7 mmol/L (ref 3.5–5.1)
Sodium: 137 mmol/L (ref 135–145)

## 2017-02-28 LAB — CBC WITH DIFFERENTIAL/PLATELET
BASOS ABS: 0 10*3/uL (ref 0.0–0.1)
BASOS PCT: 0 %
Eosinophils Absolute: 0.2 10*3/uL (ref 0.0–0.7)
Eosinophils Relative: 2 %
HEMATOCRIT: 27.1 % — AB (ref 36.0–46.0)
HEMOGLOBIN: 8.5 g/dL — AB (ref 12.0–15.0)
Lymphocytes Relative: 36 %
Lymphs Abs: 2.7 10*3/uL (ref 0.7–4.0)
MCH: 28.4 pg (ref 26.0–34.0)
MCHC: 31.4 g/dL (ref 30.0–36.0)
MCV: 90.6 fL (ref 78.0–100.0)
MONOS PCT: 7 %
Monocytes Absolute: 0.5 10*3/uL (ref 0.1–1.0)
NEUTROS ABS: 4.1 10*3/uL (ref 1.7–7.7)
NEUTROS PCT: 55 %
Platelets: 537 10*3/uL — ABNORMAL HIGH (ref 150–400)
RBC: 2.99 MIL/uL — ABNORMAL LOW (ref 3.87–5.11)
RDW: 15.5 % (ref 11.5–15.5)
WBC: 7.6 10*3/uL (ref 4.0–10.5)

## 2017-02-28 NOTE — Progress Notes (Signed)
PROGRESS NOTE    [name]  ZOX:[id number] DOB: 12/18/1985 DOA: 02/13/2017 PCP: Pearson GrippeKim, James, MD    Brief Narrative:  32 year old female who presented with a chief complaint weakness. She does have a significant past medical history for fibromyalgia, chronic anemia and history intravenous drug abuse.Complaint of fever, chills, generalized weakness for the last 2 days prior to hospitalization.She had erythema and petechiae on her left hand and bilateral lower extremities.On initial physical examination blood pressure 86/52, heart rate 70, temperature 100.7, respiratory 22 and oxygen saturation 97%.Lungs wereclear to auscultation bilaterally, no wheezing, rales or rhonchi, heart S1-S2 present, rhythmic, no gallops, rubs or murmurs, the abdomen was soft nontender,lower extremities with edema and petechiae lesions.Sodium 130, potassium 3.7, sodium 88, bicarb 27, glucose 107, BUN 9, creatinine 0.72,white count 14.4, hemoglobin 11.5, hematocrit 33.1, platelets 109.Urinalysis too numerous to count RBCs, 6-30 white cells,chest x-ray had bibasilar atelectasis,EKG sinus rhythm, 128 bpm, normal axis, normal intervals.  Patient was admitted to the hospital and diagnosis of sepsis, due to left hand cellulitis, rule out endocarditis.   Assessment & Plan:   Principal Problem:   MSSA bacteremia Active Problems:   Sepsis affecting skin (HCC)   Pelvic pain   Cellulitis of left hand   Staphylococcus aureus bacteremia with sepsis (HCC)   Weakness of both lower extremities   Pain of right clavicle   IVDU (intravenous drug user)   Septic arthritis of right sternoclavicular joint (HCC)   PFO (patent foramen ovale)   Tricuspid valve vegetation    1. 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 Positive tricuspid vegetation with risk of embolization to the arterial circulation,  due to patent foramen ovale. No surgical intervention recommended due to IV drug abuse. Plan to repeat TEE when completing antibiotic therapy. Blood cultures negative from 02/17/2017   2. Right sterno-clavicular septic arthritis. Pain is well controlled MRI with inflammatory changes at the joint, no abscess or frank collection, will continue antibiotic therapy.   3. IV drug abuse. No signs of withdrawal, on suboxone. Will need close follow up outpatient.   4. Depression. Continue cymbalta and olanzapine.  DVT prophylaxis:enoxaparin Code Status:full Family Communication:no family at the bedside Disposition Plan:home   Consultants:    Procedures:    Antimicrobials:   Subjective: Patient feeling well, no nausea or vomiting, no chest pain or dyspnea. Left shoulder pain is controlled.   Objective: Vitals:   02/27/17 1422 02/27/17 2006 02/27/17 2251 02/28/17 0526  BP: 103/74 (!) 83/55 105/74 (!) 102/58  Pulse: 83 69  (!) 52  Resp: 18 16 18 16   Temp: 97.6 F (36.4 C) 98.4 F (36.9 C)  98.3 F (36.8 C)  TempSrc:  Oral  Oral  SpO2: 100% 99%  93%  Weight:      Height:        Intake/Output Summary (Last 24 hours) at 02/28/2017 1035 Last data filed at 02/27/2017 2356 Gross per 24 hour  Intake 322 ml  Output 450 ml  Net -128 ml   Filed Weights   02/13/17 2120 02/17/17 1500  Weight: 76.5 kg (168 lb 10.4 oz) 80.7 kg (177 lb 14.6 oz)    Examination:   General: Not in pain or dyspnea, deconditioned Neurology: Awake and alert, non focal  E ENT: no pallor, no icterus, oral mucosa moist Cardiovascular: No JVD. S1-S2 present, rhythmic, no gallops, rubs, or murmurs. No lower extremity edema. Pulmonary: vesicular breath  sounds bilaterally, adequate air movement, no wheezing, rhonchi or rales. Gastrointestinal. Abdomen flat, no organomegaly, non tender, no rebound or guarding Skin. No rashes Musculoskeletal: no joint deformities     Data Reviewed: I have  personally reviewed following labs and imaging studies  CBC: Recent Labs  Lab 02/24/17 0406 02/25/17 0410 02/26/17 0448 02/27/17 0406 02/28/17 0417  WBC 6.9 8.4 8.3 6.4 7.6  NEUTROABS 3.9 5.3 4.5 2.9 4.1  HGB 8.2* 8.0* 8.6* 7.8* 8.5*  HCT 25.4* 25.2* 27.0* 24.8* 27.1*  MCV 88.2 89.0 89.7 90.2 90.6  PLT 488* 524* 513* 455* 537*   Basic Metabolic Panel: Recent Labs  Lab 02/24/17 0406 02/25/17 0410 02/26/17 0448 02/27/17 0406 02/28/17 0417  NA 135 135 136 139 137  K 3.7 3.9 4.6 3.7 3.7  CL 99* 98* 101 105 102  CO2 24 25 23 24 24   GLUCOSE 98 87 96 98 97  BUN 12 11 9 9 7   CREATININE 0.87 0.77 0.86 0.87 0.82  CALCIUM 8.8* 8.7* 8.7* 8.3* 8.8*   GFR: Estimated Creatinine Clearance: 107.6 mL/min (by C-G formula based on SCr of 0.82 mg/dL). Liver Function Tests: No results for input(s): AST, ALT, ALKPHOS, BILITOT, PROT, ALBUMIN in the last 168 hours. No results for input(s): LIPASE, AMYLASE in the last 168 hours. No results for input(s): AMMONIA in the last 168 hours. Coagulation Profile: No results for input(s): INR, PROTIME in the last 168 hours. Cardiac Enzymes: No results for input(s): CKTOTAL, CKMB, CKMBINDEX, TROPONINI in the last 168 hours. BNP (last 3 results) No results for input(s): PROBNP in the last 8760 hours. HbA1C: No results for input(s): HGBA1C in the last 72 hours. CBG: No results for input(s): GLUCAP in the last 168 hours. Lipid Profile: No results for input(s): CHOL, HDL, LDLCALC, TRIG, CHOLHDL, LDLDIRECT in the last 72 hours. Thyroid Function Tests: No results for input(s): TSH, T4TOTAL, FREET4, T3FREE, THYROIDAB in the last 72 hours. Anemia Panel: No results for input(s): VITAMINB12, FOLATE, FERRITIN, TIBC, IRON, RETICCTPCT in the last 72 hours.    Radiology Studies: I have reviewed all of the imaging during this hospital visit personally     Scheduled Meds: . buprenorphine-naloxone  2 tablet Sublingual Daily  . DULoxetine  60 mg Oral BID   . enoxaparin (LOVENOX) injection  40 mg Subcutaneous Q24H  . feeding supplement (ENSURE ENLIVE)  237 mL Oral BID BM  . ferrous sulfate  325 mg Oral TID WC  . gadobenate dimeglumine  15 mL Intravenous Once  . multivitamin with minerals  1 tablet Oral Daily  . OLANZapine  5 mg Oral q morning - 10a  . polyethylene glycol  17 g Oral Daily  . senna-docusate  1 tablet Oral BID   Continuous Infusions: . nafcillin IV Stopped (02/28/17 1020)     LOS: 15 days        Mauricio Annett Gula, MD [facility] Pager [phone]

## 2017-02-28 NOTE — Progress Notes (Signed)
Occupational Therapy Treatment Patient Details Name: [name] MRN: [id number] DOB: 01/05/1986 Today's Date: 02/28/2017    History of present illness Pt is a 32 y.o. female, with past medical history significant for fibromyalgia, anemia and history of IV drug abuse. She presented to the ED with a few days history of fever, chills, and generalized weakness muscle aches in addition to arthralgias.  She was admitted with diagnosis of sepsis.  Pt being treated for endocarditis.    OT comments  Pt making steady progress towards OT goals; completing room level functional mobility, UB/LB bathing and dressing this session with overall supervision. Pt reporting recent increased pain in R shoulder impacting her overall functional use of UE; will continue to follow acutely to further address as well as to maximize her overall safety and independence with ADLs and mobility.    Follow Up Recommendations  Home health OT;Supervision/Assistance - 24 hour    Equipment Recommendations  3 in 1 bedside commode;Tub/shower seat          Precautions / Restrictions Precautions Precautions: Fall Restrictions Weight Bearing Restrictions: No       Mobility Bed Mobility               General bed mobility comments: Pt OOB in recliner upon arrival   Transfers Overall transfer level: Needs assistance Equipment used: None Transfers: Sit to/from Stand Sit to Stand: Supervision         General transfer comment: supervision for safety    Balance Overall balance assessment: Needs assistance Sitting-balance support: No upper extremity supported;Feet supported Sitting balance-Leahy Scale: Good     Standing balance support: During functional activity Standing balance-Leahy Scale: Good                             ADL either performed or assessed with clinical judgement   ADL Overall ADL's : Needs assistance/impaired Eating/Feeding: Independent   Grooming: Wash/dry  face;Supervision/safety;Standing   Upper Body Bathing: Supervision/ safety;Sitting   Lower Body Bathing: Supervison/ safety;Sit to/from stand   Upper Body Dressing : Minimal assistance;Sitting Upper Body Dressing Details (indicate cue type and reason): donning new gown  Lower Body Dressing: Supervision/safety;Sit to/from stand Lower Body Dressing Details (indicate cue type and reason): Pt doffing pants; donning underwear and socks using figure 4 technique              Functional mobility during ADLs: Supervision/safety General ADL Comments: Pt completing room level functional mobility with supervision; Pt completing UB/LB bathing seated at sink as well as UB/LB dressing with overall supervision; discussed plan to incorporate UE exercises/stretching into upcoming tx session to increase/maintain UB strength as well as to improve pain in RUE                        Cognition Arousal/Alertness: Awake/alert Behavior During Therapy: WFL for tasks assessed/performed Overall Cognitive Status: Within Functional Limits for tasks assessed                                                            Pertinent Vitals/ Pain       Pain Assessment: Faces Faces Pain Scale: Hurts a little bit Pain Location: R shoulder with certain movement; bil LEs Pain Descriptors /  Indicators: Sore Pain Intervention(s): Limited activity within patient's tolerance;Monitored during session                                                          Frequency  Min 3X/week        Progress Toward Goals  OT Goals(current goals can now be found in the care plan section)  Progress towards OT goals: Progressing toward goals  Acute Rehab OT Goals Patient Stated Goal: get better OT Goal Formulation: With patient Time For Goal Achievement: 03/03/17 Potential to Achieve Goals: Good  Plan Discharge plan remains appropriate                      AM-PAC PT "6 Clicks" Daily Activity     Outcome Measure   Help from another person eating meals?: None Help from another person taking care of personal grooming?: None Help from another person toileting, which includes using toliet, bedpan, or urinal?: None Help from another person bathing (including washing, rinsing, drying)?: A Little Help from another person to put on and taking off regular upper body clothing?: A Little Help from another person to put on and taking off regular lower body clothing?: A Little 6 Click Score: 21    End of Session    OT Visit Diagnosis: Other abnormalities of gait and mobility (R26.89);Muscle weakness (generalized) (M62.81);Pain Pain - Right/Left: (bil) Pain - part of body: Leg(R shoulder )   Activity Tolerance Patient tolerated treatment well   Patient Left in chair;with call bell/phone within reach   Nurse Communication Mobility status        Time: [id number] OT Time Calculation (min): 24 min  Charges: OT General Charges $OT Visit: 1 Visit OT Treatments $Self Care/Home Management : 8-22 mins  Marcy SirenBreanna Teala Daffron, OT Pager [phone] 02/28/2017    Orlando PennerBreanna L Mattison Stuckey 02/28/2017, 2:04 PM

## 2017-02-28 NOTE — Progress Notes (Signed)
Subjective:  No new complaints.   Antibiotics:  Anti-infectives (From admission, onward)   Start     Dose/Rate Route Frequency Ordered Stop   02/24/17 1600  nafcillin 2 g in sodium chloride 0.9 % 100 mL IVPB     2 g 200 mL/hr over 30 Minutes Intravenous Every 4 hours 02/24/17 0833     02/21/17 2000  nafcillin 2 g in dextrose 5 % 100 mL IVPB  Status:  Discontinued     2 g 200 mL/hr over 30 Minutes Intravenous Every 4 hours 02/21/17 1741 02/24/17 0910   02/21/17 1730  nafcillin injection 2 g  Status:  Discontinued     2 g Intravenous Every 4 hours 02/21/17 1726 02/21/17 1740   02/19/17 1230  ceFAZolin (ANCEF) IVPB 2g/100 mL premix  Status:  Discontinued     2 g 200 mL/hr over 30 Minutes Intravenous Every 8 hours 02/19/17 1148 02/21/17 1726   02/18/17 1330  linezolid (ZYVOX) tablet 600 mg  Status:  Discontinued     600 mg Oral Every 12 hours 02/18/17 1250 02/19/17 1126   02/17/17 0800  sulfamethoxazole-trimethoprim (BACTRIM DS,SEPTRA DS) 800-160 MG per tablet 2 tablet  Status:  Discontinued     2 tablet Oral Every 12 hours 02/16/17 1453 02/18/17 1250   02/16/17 1600  sulfamethoxazole-trimethoprim (BACTRIM DS,SEPTRA DS) 800-160 MG per tablet 2 tablet     2 tablet Oral  Once 02/16/17 1509 02/16/17 1657   02/14/17 1400  ceFAZolin (ANCEF) IVPB 2g/100 mL premix  Status:  Discontinued     2 g 200 mL/hr over 30 Minutes Intravenous Every 8 hours 02/14/17 1013 02/16/17 1453   02/14/17 0200  vancomycin (VANCOCIN) IVPB 750 mg/150 ml premix  Status:  Discontinued     750 mg 150 mL/hr over 60 Minutes Intravenous Every 8 hours 02/13/17 1739 02/14/17 1017   02/14/17 0000  piperacillin-tazobactam (ZOSYN) IVPB 3.375 g  Status:  Discontinued     3.375 g 12.5 mL/hr over 240 Minutes Intravenous Every 8 hours 02/13/17 1739 02/14/17 1013   02/13/17 1715  piperacillin-tazobactam (ZOSYN) IVPB 3.375 g     3.375 g 100 mL/hr over 30 Minutes Intravenous  Once 02/13/17 1701 02/13/17 1824   02/13/17 1715  vancomycin (VANCOCIN) IVPB 1000 mg/200 mL premix  Status:  Discontinued     1,000 mg 200 mL/hr over 60 Minutes Intravenous  Once 02/13/17 1701 02/13/17 1705   02/13/17 1715  vancomycin (VANCOCIN) 1,500 mg in sodium chloride 0.9 % 500 mL IVPB     1,500 mg 250 mL/hr over 120 Minutes Intravenous  Once 02/13/17 1705 02/13/17 2300      Medications: Scheduled Meds: . buprenorphine-naloxone  2 tablet Sublingual Daily  . DULoxetine  60 mg Oral BID  . enoxaparin (LOVENOX) injection  40 mg Subcutaneous Q24H  . feeding supplement (ENSURE ENLIVE)  237 mL Oral BID BM  . ferrous sulfate  325 mg Oral TID WC  . gadobenate dimeglumine  15 mL Intravenous Once  . multivitamin with minerals  1 tablet Oral Daily  . OLANZapine  5 mg Oral q morning - 10a  . polyethylene glycol  17 g Oral Daily  . senna-docusate  1 tablet Oral BID   Continuous Infusions: . nafcillin IV Stopped (02/28/17 1328)   PRN Meds:.acetaminophen, ALPRAZolam, cyclobenzaprine, ondansetron **OR** ondansetron (ZOFRAN) IV, sodium chloride flush    Objective: Weight change:   Intake/Output Summary (Last 24 hours) at 02/28/2017 1501 Last data filed at 02/27/2017  2356 Gross per 24 hour  Intake 100 ml  Output 450 ml  Net -350 ml   Blood pressure (!) 102/58, pulse (!) 52, temperature 98.3 F (36.8 C), temperature source Oral, resp. rate 16, height '5\' 7"'  (1.702 m), weight 177 lb 14.6 oz (80.7 kg), last menstrual period 01/28/2017, SpO2 93 %. Temp:  [98.3 F (36.8 C)-98.4 F (36.9 C)] 98.3 F (36.8 C) (02/15 0526) Pulse Rate:  [52-69] 52 (02/15 0526) Resp:  [16-18] 16 (02/15 0526) BP: (83-105)/(55-74) 102/58 (02/15 0526) SpO2:  [93 %-99 %] 93 % (02/15 0526)  Physical Exam: General: Alert and awake, oriented x3,and dysphoric HEENT: anicteric sclera,EOMI CVS regular rate, normal r,  no murmur rubs or gallops Chest: clear to auscultation bilaterally, no wheezing, rales or rhonchi Abdomen: soft nontender,  nondistended, normal bowel sounds, Extremities: she has minimal tenderness of clavicle Skin: rashes resolving on feet  Neuro: she does still have weakness in lower extremities  CBC:  CBC Latest Ref Rng & Units 02/28/2017 02/27/2017 02/26/2017  WBC 4.0 - 10.5 K/uL 7.6 6.4 8.3  Hemoglobin 12.0 - 15.0 g/dL 8.5(L) 7.8(L) 8.6(L)  Hematocrit 36.0 - 46.0 % 27.1(L) 24.8(L) 27.0(L)  Platelets 150 - 400 K/uL 537(H) 455(H) 513(H)      BMET Recent Labs    02/27/17 0406 02/28/17 0417  NA 139 137  K 3.7 3.7  CL 105 102  CO2 24 24  GLUCOSE 98 97  BUN 9 7  CREATININE 0.87 0.82  CALCIUM 8.3* 8.8*     Liver Panel  No results for input(s): PROT, ALBUMIN, AST, ALT, ALKPHOS, BILITOT, BILIDIR, IBILI in the last 72 hours.     Sedimentation Rate No results for input(s): ESRSEDRATE in the last 72 hours. C-Reactive Protein No results for input(s): CRP in the last 72 hours.  Micro Results: Recent Results (from the past 720 hour(s))  Culture, blood (Routine x 2)     Status: Abnormal   Collection Time: 02/13/17  4:00 PM  Result Value Ref Range Status   Specimen Description BLOOD RIGHT ANTECUBITAL  Final   Special Requests IN PEDIATRIC BOTTLE Blood Culture adequate volume  Final   Culture  Setup Time   Final    GRAM POSITIVE COCCI IN PEDIATRIC BOTTLE CRITICAL RESULT CALLED TO, READ BACK BY AND VERIFIED WITHKarsten Ro [facility] [id number] 02/14/17 A BROWNING Performed at [facility], [place] [address], [place], [place] [address]    Culture STAPHYLOCOCCUS AUREUS (A)  Final   Report Status 02/17/2017 FINAL  Final   Organism ID, Bacteria STAPHYLOCOCCUS AUREUS  Final      Susceptibility   Staphylococcus aureus - MIC*    CIPROFLOXACIN <=0.5 SENSITIVE Sensitive     ERYTHROMYCIN <=0.25 SENSITIVE Sensitive     GENTAMICIN <=0.5 SENSITIVE Sensitive     OXACILLIN <=0.25 SENSITIVE Sensitive     TETRACYCLINE >=16 RESISTANT Resistant     VANCOMYCIN 1 SENSITIVE Sensitive     TRIMETH/SULFA <=10  SENSITIVE Sensitive     CLINDAMYCIN <=0.25 SENSITIVE Sensitive     RIFAMPIN <=0.5 SENSITIVE Sensitive     Inducible Clindamycin NEGATIVE Sensitive     * STAPHYLOCOCCUS AUREUS  Blood Culture ID Panel (Reflexed)     Status: Abnormal   Collection Time: 02/13/17  4:00 PM  Result Value Ref Range Status   Enterococcus species NOT DETECTED NOT DETECTED Final   Listeria monocytogenes NOT DETECTED NOT DETECTED Final   Staphylococcus species DETECTED (A) NOT DETECTED Final    Comment: CRITICAL RESULT CALLED  TO, READ BACK BY AND VERIFIED WITH: Karsten Ro PHARMD [id number] 02/14/17 A BROWNING    Staphylococcus aureus DETECTED (A) NOT DETECTED Final    Comment: Methicillin (oxacillin) susceptible Staphylococcus aureus (MSSA). Preferred therapy is anti staphylococcal beta lactam antibiotic (Cefazolin or Nafcillin), unless clinically contraindicated. CRITICAL RESULT CALLED TO, READ BACK BY AND VERIFIED WITH: Karsten Ro PHARMD [id number] 02/14/17 A BROWNING    Methicillin resistance NOT DETECTED NOT DETECTED Final   Streptococcus species NOT DETECTED NOT DETECTED Final   Streptococcus agalactiae NOT DETECTED NOT DETECTED Final   Streptococcus pneumoniae NOT DETECTED NOT DETECTED Final   Streptococcus pyogenes NOT DETECTED NOT DETECTED Final   Acinetobacter baumannii NOT DETECTED NOT DETECTED Final   Enterobacteriaceae species NOT DETECTED NOT DETECTED Final   Enterobacter cloacae complex NOT DETECTED NOT DETECTED Final   Escherichia coli NOT DETECTED NOT DETECTED Final   Klebsiella oxytoca NOT DETECTED NOT DETECTED Final   Klebsiella pneumoniae NOT DETECTED NOT DETECTED Final   Proteus species NOT DETECTED NOT DETECTED Final   Serratia marcescens NOT DETECTED NOT DETECTED Final   Haemophilus influenzae NOT DETECTED NOT DETECTED Final   Neisseria meningitidis NOT DETECTED NOT DETECTED Final   Pseudomonas aeruginosa NOT DETECTED NOT DETECTED Final   Candida albicans NOT DETECTED NOT DETECTED Final   Candida glabrata  NOT DETECTED NOT DETECTED Final   Candida krusei NOT DETECTED NOT DETECTED Final   Candida parapsilosis NOT DETECTED NOT DETECTED Final   Candida tropicalis NOT DETECTED NOT DETECTED Final    Comment: Performed at [facility], [place]. [address] [place], [place] [address]  Culture, blood (Routine x 2)     Status: Abnormal   Collection Time: 02/13/17  5:00 PM  Result Value Ref Range Status   Specimen Description BLOOD RIGHT ANTECUBITAL  Final   Special Requests   Final    BOTTLES DRAWN AEROBIC AND ANAEROBIC Blood Culture adequate volume   Culture  Setup Time   Final    GRAM POSITIVE COCCI IN CLUSTERS IN BOTH AEROBIC AND ANAEROBIC BOTTLES CRITICAL VALUE NOTED.  VALUE IS CONSISTENT WITH PREVIOUSLY REPORTED AND CALLED VALUE.    Culture (A)  Final    STAPHYLOCOCCUS AUREUS SUSCEPTIBILITIES PERFORMED ON PREVIOUS CULTURE WITHIN THE LAST 5 DAYS. Performed at [facility], [place] [address], [place], [place] [address]    Report Status 02/16/2017 FINAL  Final  MRSA PCR Screening     Status: None   Collection Time: 02/13/17  9:32 PM  Result Value Ref Range Status   MRSA by PCR NEGATIVE NEGATIVE Final    Comment:        The GeneXpert MRSA Assay (FDA approved for NASAL specimens only), is one component of a comprehensive MRSA colonization surveillance program. It is not intended to diagnose MRSA infection nor to guide or monitor treatment for MRSA infections.   Culture, blood (Routine X 2) w Reflex to ID Panel     Status: Abnormal   Collection Time: 02/14/17 10:07 AM  Result Value Ref Range Status   Specimen Description BLOOD RIGHT HAND  Final   Special Requests IN PEDIATRIC BOTTLE Blood Culture adequate volume  Final   Culture  Setup Time   Final    GRAM POSITIVE COCCI IN PEDIATRIC BOTTLE CRITICAL VALUE NOTED.  VALUE IS CONSISTENT WITH PREVIOUSLY REPORTED AND CALLED VALUE.    Culture (A)  Final    STAPHYLOCOCCUS AUREUS SUSCEPTIBILITIES PERFORMED ON PREVIOUS CULTURE WITHIN  THE LAST 5 DAYS. Performed at [facility], [address], [place], [place] [address]    Report Status 02/17/2017 FINAL  Final  Culture, blood (Routine X 2) w Reflex to ID Panel     Status: Abnormal   Collection Time: 02/17/17 10:10 AM  Result Value Ref Range Status   Specimen Description BLOOD RIGHT HAND  Final   Special Requests IN PEDIATRIC BOTTLE Blood Culture adequate volume  Final   Culture  Setup Time   Final    GRAM POSITIVE COCCI IN PEDIATRIC BOTTLE CRITICAL VALUE NOTED.  VALUE IS CONSISTENT WITH PREVIOUSLY REPORTED AND CALLED VALUE.    Culture (A)  Final    STAPHYLOCOCCUS AUREUS SUSCEPTIBILITIES PERFORMED ON PREVIOUS CULTURE WITHIN THE LAST 5 DAYS. Performed at [facility], [place] [address], [place], [place] [address]    Report Status 02/19/2017 FINAL  Final  Culture, blood (Routine X 2) w Reflex to ID Panel     Status: None   Collection Time: 02/17/17 10:15 AM  Result Value Ref Range Status   Specimen Description BLOOD LEFT ANTECUBITAL  Final   Special Requests IN PEDIATRIC BOTTLE Blood Culture adequate volume  Final   Culture   Final    NO GROWTH 5 DAYS Performed at [facility], [place] [address] [place], [place] [address]    Report Status 02/22/2017 FINAL  Final  Culture, blood (Routine X 2) w Reflex to ID Panel     Status: None   Collection Time: 02/19/17  9:15 AM  Result Value Ref Range Status   Specimen Description BLOOD LEFT ANTECUBITAL  Final   Special Requests   Final    BOTTLES DRAWN AEROBIC AND ANAEROBIC Blood Culture adequate volume   Culture   Final    NO GROWTH 5 DAYS Performed at [facility], [place] [address], [place], [place] [address]    Report Status 02/24/2017 FINAL  Final  Culture, blood (Routine X 2) w Reflex to ID Panel     Status: None   Collection Time: 02/19/17  9:30 AM  Result Value Ref Range Status   Specimen Description BLOOD RIGHT ANTECUBITAL  Final   Special Requests   Final    BOTTLES DRAWN AEROBIC AND  ANAEROBIC Blood Culture adequate volume   Culture   Final    NO GROWTH 5 DAYS Performed at [facility], [place] [address] [place], [place] [address]    Report Status 02/24/2017 FINAL  Final    Studies/Results: Mr Sternum Wo Contrast  Result Date: 02/27/2017 CLINICAL DATA:  Right sternoclavicular septic arthritis. Pain with active motion. Weakness. EXAM: MRI sternum without contrast TECHNIQUE: Multiplanar, multiecho pulse sequences of the sternum without intravenous contrast. CONTRAST:  None COMPARISON:  02/25/2017 CT chest FINDINGS: Patient could not fully tolerate the exam. Coronal T2 fat saturated and postcontrast imaging was not possible. Trace intra-articular and periarticular edema about the right sternoclavicular joint is noted. Reactive marrow changes are seen about the joint. Findings are compatible with history of septic arthritis. No significant drainable fluid collections are noted. No soft tissue mass is seen. IMPRESSION: Reactive marrow changes about the right sternoclavicular joint with trace intra-articular and periarticular fluid/edema. Findings are in keeping with history of septic arthritis. The contralateral sternoclavicular joint is unremarkable. No marrow signal changes to suggest acute osteomyelitis. Electronically Signed   By: Ashley Royalty M.D.   On: 02/27/2017 22:08   Mr Shoulder Right W Wo Contrast  Result Date: 02/27/2017 CLINICAL DATA:  32 year old female with right sternoclavicular septic  arthritis EXAM: MRI OF THE RIGHT SHOULDER WITHOUT AND WITH CONTRAST TECHNIQUE: Multiplanar, multisequence MR imaging of the right shoulder was performed before and after the administration of intravenous contrast. CONTRAST:  59m MULTIHANCE GADOBENATE DIMEGLUMINE 529 MG/ML IV SOLN COMPARISON:  None. FINDINGS: Rotator cuff: Intact supraspinatus, infraspinatus, teres minor and subscapularis tendons. Muscles: No atrophy or abnormal signal of the muscles of the rotator cuff. Biceps long  head:  Intact. Acromioclavicular Joint: Normal acromioclavicular joint. Type II curved acromion. Trace subacromial and subdeltoid bursal fluid and/or edema. Glenohumeral Joint: No joint effusion. No chondral defect. Labrum:  Intact. Bones: Small subcortical degenerative cystic change along the posterolateral aspect of the humeral head. Marrow edema or fracture. No joint dislocation. Other: None. IMPRESSION: 1. Minimal subacromial bursitis. 2. No rotator cuff tear, marrow signal abnormality, fracture or joint dislocation. 3. Small focus of subcortical degenerate cystic change along the posterior aspect of the humeral head. Electronically Signed   By: DAshley RoyaltyM.D.   On: 02/27/2017 22:04      Assessment/Plan:  INTERVAL HISTORY:  MRI shows known septic sternoclavicular septic arthritis but no evidence of osteomyelitis at that site.  There is no other evidence of septic shoulder  Principal Problem:   MSSA bacteremia Active Problems:   Sepsis affecting skin (HCC)   Pelvic pain   Cellulitis of left hand   Staphylococcus aureus bacteremia with sepsis (HCC)   Weakness of both lower extremities   Pain of right clavicle   IVDU (intravenous drug user)   Septic arthritis of right sternoclavicular joint (HCC)   PFO (patent foramen ovale)   Tricuspid valve vegetation    CTOBIN CADIENTEis a 32y.o. 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  Specific aspects of clinical management are critical to optimizing the outcome of patients with SAB.  Therefore, the [facility] ([facility] has initiated an intervention aimed at [facility]   To do so, Infectious Diseases physicians are providing an evidence-based consult for the management of all patients with SAB.     Yes No Comments  Perform follow-up blood cultures (even if the patient is afebrile) to ensure clearance of bacteremia '[x]'  '[]'  02/19/17 culture NO GROWTH x 5 days  Remove vascular catheter and obtain follow-up blood cultures after the removal of the catheter '[]'  '[]'  Would not place PICC until DISPO is clear  Perform echocardiography to evaluate for endocarditis (transthoracic ECHO is 40-50% sensitive, TEE is > 90% sensitive) '[]'  '[]'  Please keep in mind, that neither test can definitively EXCLUDE endocarditis, and that should clinical suspicion remain high for endocarditis the patient should then still be treated with an "endocarditis" duration of therapy = 6 weeks  She has vegetation with possible perforation on TV w mod to severe TR and PFO  Consult electrophysiologist to evaluate implanted cardiac device (pacemaker, ICD) '[]'  '[]'    Ensure source control '[]'  '[]'  Have all abscesses been drained effectively? Have deep seeded infections (septic joints or osteomyelitis) had appropriate surgical debridement?  SHE HAS BEEN SEEN BY CT SURGERY  Investigate for "metastatic" sites of infection '[]'  '[]'  Does the patient have ANY symptom or physical exam finding that would suggest  a deeper infection (back or neck pain that may be suggestive of vertebral osteomyelitis or epidural abscess, muscle pain that could be a symptom of pyomyositis)?  Keep in mind that for deep seeded infections MRI imaging with contrast is preferred rather than other often insensitive tests such as plain x-rays, especially early in a patient's presentation.   MRI brain was without septic emboli  Changed antibiotic therapy to NAFCILLIN GIVEN PFO AND CONCERN ABOUT SEPTIC EMBOLI TO CNS  '[]'  '[]'  Beta-lactam antibiotics are preferred for MSSA due to higher cure rates.   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 Dr. Servando Snare does not recommend TVR in context of her ongoing drug use. Family is unhappy with this but this is a very difficult clinical situation in which Dr. Servando Snare has to weigh risks/benefits.   I do think it is possible patient might be able to be treated with IV antibiotics and get effective treatment. Certainly her blood cultures have sterilized. Now I cannot guarantee nor can anyone that the vegetation might not embolize across PFO into left sided circulation even if it is sterilized  WOULD recommend repeat TTE and likely repeat TEE to assess vegetation.  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 Daryll Drown seeing the patient  IV antibiotic plan is as follows:  Diagnosis:  MSSA bacteremia with right-sided endocarditis and patent foreman ovale, right sided septic sternoclavicular arthritis  Culture Result: MSSA  Allergies  Allergen Reactions  . Azithromycin Nausea And Vomiting    ABDOMINAL PAIN  . Nsaids     UNSPECIFIED REACTION    . Hydrocodone Nausea And Vomiting  . Penicillins Nausea And Vomiting     Has patient had a PCN reaction causing immediate rash, facial/tongue/throat swelling, SOB or lightheadedness with hypotension: No Has patient had a PCN reaction causing severe rash involving mucus membranes or skin necrosis:  No Has patient had a PCN reaction that required hospitalization No Has patient had a PCN reaction occurring within the last 10 years: No If all of the above answers are "NO", then may proceed with Cephalosporin use.   . Prednisone Nausea Only and Other (See Comments)    Can take shot, but pill form "caused stomach pain , nausea"    OPAT Orders Discharge antibiotics: Nafcillin 2g IV q 4 hours  Duration: 8 weeks End Date:  April 2nd, 2019  [facility] Care Per Protocol:  Labs weekly while on IV antibiotics: _x_ CBC with differential _x_ BMP  x__ CRP _x_ ESR   _x_ Please pull PIC at completion of IV antibiotics __ Please leave PIC in place until doctor has seen patient or been notified  Fax weekly labs to [phone]  Please call us back to see the patient as she  nears 6 weeks of IV antibiotics  I will sing off for now.  Please call with further questions.         LOS: 15 days   Alcide Evener 02/28/2017, 3:01 PM

## 2017-02-28 NOTE — Progress Notes (Signed)
Physical Therapy Treatment Patient Details Name: [name] MRN: [id number] DOB: 09/16/1985 Today's Date: 02/28/2017    History of Present Illness Pt is a 32 y.o. female, with past medical history significant for fibromyalgia, anemia and history of IV drug abuse. She presented to the ED with a few days history of fever, chills, and generalized weakness muscle aches in addition to arthralgias.  She was admitted with diagnosis of sepsis.  Pt being treated for endocarditis.     PT Comments    Pt continuing to make good progress with functional mobility. PT will continue to follow acutely to ensure a safe d/c home.  Follow Up Recommendations  Home health PT;Supervision/Assistance - 24 hour     Equipment Recommendations  None recommended by PT    Recommendations for Other Services       Precautions / Restrictions Precautions Precautions: Fall Restrictions Weight Bearing Restrictions: No    Mobility  Bed Mobility Overal bed mobility: Modified Independent                Transfers Overall transfer level: Needs assistance Equipment used: None Transfers: Sit to/from Stand Sit to Stand: Supervision         General transfer comment: supervision for safety  Ambulation/Gait Ambulation/Gait assistance: Supervision Ambulation Distance (Feet): 400 Feet Assistive device: None Gait Pattern/deviations: Step-through pattern;Decreased stride length Gait velocity: decreased Gait velocity interpretation: Below normal speed for age/gender General Gait Details: pt steady without use of an AD, no LOB or need for physical assistance   Stairs            Wheelchair Mobility    Modified Rankin (Stroke Patients Only)       Balance Overall balance assessment: Needs assistance Sitting-balance support: No upper extremity supported;Feet supported Sitting balance-Leahy Scale: Good     Standing balance support: During functional activity Standing balance-Leahy Scale:  Good Standing balance comment: pt able to stand to don pants without UE support or assistance                            Cognition Arousal/Alertness: Awake/alert Behavior During Therapy: WFL for tasks assessed/performed Overall Cognitive Status: Within Functional Limits for tasks assessed                                        Exercises      General Comments        Pertinent Vitals/Pain Pain Assessment: No/denies pain    Home Living                      Prior Function            PT Goals (current goals can now be found in the care plan section) Acute Rehab PT Goals PT Goal Formulation: With patient Time For Goal Achievement: 03/03/17 Potential to Achieve Goals: Good Progress towards PT goals: Progressing toward goals    Frequency    Min 3X/week      PT Plan Current plan remains appropriate    Co-evaluation              AM-PAC PT "6 Clicks" Daily Activity  Outcome Measure  Difficulty turning over in bed (including adjusting bedclothes, sheets and blankets)?: None Difficulty moving from lying on back to sitting on the side of the bed? : None Difficulty sitting down on  and standing up from a chair with arms (e.g., wheelchair, bedside commode, etc,.)?: None Help needed moving to and from a bed to chair (including a wheelchair)?: None Help needed walking in hospital room?: None Help needed climbing 3-5 steps with a railing? : A Little 6 Click Score: 23    End of Session   Activity Tolerance: Patient tolerated treatment well Patient left: in chair;with call bell/phone within reach Nurse Communication: Mobility status PT Visit Diagnosis: Muscle weakness (generalized) (M62.81);Difficulty in walking, not elsewhere classified (R26.2)     Time: 1308-6578 PT Time Calculation (min) (ACUTE ONLY): 13 min  Charges:  $Gait Training: 8-22 mins                    G Codes:       [place], [place],  [place] [phone]    Alessandra Bevels Eleonora Peeler 02/28/2017, 9:54 AM

## 2017-03-01 NOTE — Plan of Care (Signed)
Progressing

## 2017-03-01 NOTE — Progress Notes (Signed)
PROGRESS NOTE    Tammy ArabCasey M [name]  ZOX:[id number] DOB: 09/11/1985 DOA: 02/13/2017 PCP: Pearson GrippeKim, James, MD    Brief Narrative:  32 year old female who presented with a chief complaint weakness. She does have a significant past medical history for fibromyalgia, chronic anemia and history intravenous drug abuse.Complaint of fever, chills, generalized weakness for the last 2 days prior to hospitalization.She had erythema and petechiae on her left hand and bilateral lower extremities.On initial physical examination blood pressure 86/52, heart rate 70, temperature 100.7, respiratory 22 and oxygen saturation 97%.Lungs wereclear to auscultation bilaterally, no wheezing, rales or rhonchi, heart S1-S2 present, rhythmic, no gallops, rubs or murmurs, the abdomen was soft nontender,lower extremities with edema and petechiae lesions.Sodium 130, potassium 3.7, sodium 88, bicarb 27, glucose 107, BUN 9, creatinine 0.72,white count 14.4, hemoglobin 11.5, hematocrit 33.1, platelets 109.Urinalysis too numerous to count RBCs, 6-30 white cells,chest x-ray had bibasilar atelectasis,EKG sinus rhythm, 128 bpm, normal axis, normal intervals.  Patient was admitted to the hospital and diagnosis of sepsis, due to left hand cellulitis, rule out endocarditis   Assessment & Plan:   Principal Problem:   MSSA bacteremia Active Problems:   Sepsis affecting skin (HCC)   Pelvic pain   Cellulitis of left hand   Staphylococcus aureus bacteremia with sepsis (HCC)   Weakness of both lower extremities   Pain of right clavicle   IVDU (intravenous drug user)   Septic arthritis of right sternoclavicular joint (HCC)   PFO (patent foramen ovale)   Tricuspid valve vegetation   1. Tricuspid valve endocarditiswithsevere TR and PFO. Positive tricuspid vegetation with risk of embolization to the arterial circulation, due to patent foramen ovale. Blood cultures negative from 02/17/2017.  Continue IVantibiotic therapy with  nafcillin, till April 15, 2017. Plan to repeat TEE within the next 4 weeks. Will hold on PICC line for now until defined disposition. Hx of substance abuse, high risk for complications for outpatient antibiotic therapy.   2. Right sterno-clavicular septic arthritis. Plan to repeat CT in the next 4 weeks, continue pain control and antibiotic therapy.   3. IV drug abuse.Tolerating well suboxone.   4. Depression.Continuecymbalta and olanzapine.No confusion or agitation.   DVT prophylaxis:enoxaparin Code Status:full Family Communication:no family at the bedside Disposition Plan:home   Consultants:    Procedures:    Antimicrobials:   Subjective: Patient is feeling better, no chest pain or dyspnea, no nausea or vomiting.   Objective: Vitals:   02/28/17 0526 02/28/17 1500 02/28/17 2146 03/01/17 0522  BP: (!) 102/58 102/65 (!) 92/50 107/75  Pulse: (!) 52 83 67 (!) 59  Resp: 16  18 16   Temp: 98.3 F (36.8 C) 98.1 F (36.7 C) 98.4 F (36.9 C) 97.8 F (36.6 C)  TempSrc: Oral Oral Oral Oral  SpO2: 93%  99% 98%  Weight:      Height:        Intake/Output Summary (Last 24 hours) at 03/01/2017 1041 Last data filed at 02/28/2017 1834 Gross per 24 hour  Intake 1180 ml  Output 700 ml  Net 480 ml   Filed Weights   02/13/17 2120 02/17/17 1500  Weight: 76.5 kg (168 lb 10.4 oz) 80.7 kg (177 lb 14.6 oz)    Examination:   General: Not in pain or dyspnea,  Neurology: Awake and alert, non focal  E ENT: no pallor, no icterus, oral mucosa moist Cardiovascular: No JVD. S1-S2 present, rhythmic, no gallops, rubs, or murmurs. No lower extremity edema. Pulmonary: vesicular breath sounds bilaterally, adequate air movement, no wheezing,  rhonchi or rales. Gastrointestinal. Abdomen flat, no organomegaly, non tender, no rebound or guarding Skin. No rashes Musculoskeletal: no joint deformities     Data Reviewed: I have personally reviewed following labs and  imaging studies  CBC: Recent Labs  Lab 02/24/17 0406 02/25/17 0410 02/26/17 0448 02/27/17 0406 02/28/17 0417  WBC 6.9 8.4 8.3 6.4 7.6  NEUTROABS 3.9 5.3 4.5 2.9 4.1  HGB 8.2* 8.0* 8.6* 7.8* 8.5*  HCT 25.4* 25.2* 27.0* 24.8* 27.1*  MCV 88.2 89.0 89.7 90.2 90.6  PLT 488* 524* 513* 455* 537*   Basic Metabolic Panel: Recent Labs  Lab 02/24/17 0406 02/25/17 0410 02/26/17 0448 02/27/17 0406 02/28/17 0417  NA 135 135 136 139 137  K 3.7 3.9 4.6 3.7 3.7  CL 99* 98* 101 105 102  CO2 24 25 23 24 24   GLUCOSE 98 87 96 98 97  BUN 12 11 9 9 7   CREATININE 0.87 0.77 0.86 0.87 0.82  CALCIUM 8.8* 8.7* 8.7* 8.3* 8.8*   GFR: Estimated Creatinine Clearance: 107.6 mL/min (by C-G formula based on SCr of 0.82 mg/dL). Liver Function Tests: No results for input(s): AST, ALT, ALKPHOS, BILITOT, PROT, ALBUMIN in the last 168 hours. No results for input(s): LIPASE, AMYLASE in the last 168 hours. No results for input(s): AMMONIA in the last 168 hours. Coagulation Profile: No results for input(s): INR, PROTIME in the last 168 hours. Cardiac Enzymes: No results for input(s): CKTOTAL, CKMB, CKMBINDEX, TROPONINI in the last 168 hours. BNP (last 3 results) No results for input(s): PROBNP in the last 8760 hours. HbA1C: No results for input(s): HGBA1C in the last 72 hours. CBG: No results for input(s): GLUCAP in the last 168 hours. Lipid Profile: No results for input(s): CHOL, HDL, LDLCALC, TRIG, CHOLHDL, LDLDIRECT in the last 72 hours. Thyroid Function Tests: No results for input(s): TSH, T4TOTAL, FREET4, T3FREE, THYROIDAB in the last 72 hours. Anemia Panel: No results for input(s): VITAMINB12, FOLATE, FERRITIN, TIBC, IRON, RETICCTPCT in the last 72 hours.    Radiology Studies: I have reviewed all of the imaging during this hospital visit personally     Scheduled Meds: . buprenorphine-naloxone  2 tablet Sublingual Daily  . DULoxetine  60 mg Oral BID  . enoxaparin (LOVENOX) injection  40  mg Subcutaneous Q24H  . feeding supplement (ENSURE ENLIVE)  237 mL Oral BID BM  . ferrous sulfate  325 mg Oral TID WC  . gadobenate dimeglumine  15 mL Intravenous Once  . multivitamin with minerals  1 tablet Oral Daily  . OLANZapine  5 mg Oral q morning - 10a  . polyethylene glycol  17 g Oral Daily  . senna-docusate  1 tablet Oral BID   Continuous Infusions: . nafcillin IV 2 g (03/01/17 0828)     LOS: 16 days        Reneta Niehaus Annett Gula, MD [facility] Pager [phone]

## 2017-03-02 MED ORDER — DULOXETINE HCL 60 MG PO CPEP
60.0000 mg | ORAL_CAPSULE | Freq: Two times a day (BID) | ORAL | Status: DC
  Administered 2017-03-03 – 2017-04-15 (×82): 60 mg via ORAL
  Filled 2017-03-02 (×85): qty 1

## 2017-03-02 MED ORDER — LINEZOLID 600 MG PO TABS
600.0000 mg | ORAL_TABLET | Freq: Two times a day (BID) | ORAL | Status: DC
  Administered 2017-03-02: 600 mg via ORAL
  Filled 2017-03-02 (×2): qty 1

## 2017-03-02 NOTE — Progress Notes (Signed)
Dear Doctor: Daiva EvesVan Dam This patient has been identified as a candidate for PICC for the following reason (s): IV therapy over 48 hours, poor veins/poor circulatory system (CHF, COPD, emphysema, diabetes, steroid use, IV drug abuse, etc.) and restarts due to phlebitis and infiltration in 24 hours If you agree, please write an order for the indicated device. For any questions contact the Vascular Access Team at [phone] if no answer, please leave a message.  Thank you for supporting the early vascular access assessment program.

## 2017-03-02 NOTE — Progress Notes (Signed)
PROGRESS NOTE    [name]  ZOX:[id number] DOB: 08/02/1985 DOA: 02/13/2017 PCP: Pearson GrippeKim, James, MD    Brief Narrative:  32 year old female who presented with a chief complaint weakness. She does have a significant past medical history for fibromyalgia, chronic anemia and history intravenous drug abuse.Complaint of fever, chills, generalized weakness for the last 2 days prior to hospitalization.She had erythema and petechiae on her left hand and bilateral lower extremities.On initial physical examination blood pressure 86/52, heart rate 70, temperature 100.7, respiratory 22 and oxygen saturation 97%.Lungs wereclear to auscultation bilaterally, no wheezing, rales or rhonchi, heart S1-S2 present, rhythmic, no gallops, rubs or murmurs, the abdomen was soft nontender,lower extremities with edema and petechiae lesions.Sodium 130, potassium 3.7, sodium 88, bicarb 27, glucose 107, BUN 9, creatinine 0.72,white count 14.4, hemoglobin 11.5, hematocrit 33.1, platelets 109.Urinalysis too numerous to count RBCs, 6-30 white cells,chest x-ray had bibasilar atelectasis,EKG sinus rhythm, 128 bpm, normal axis, normal intervals.  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 Not candidate for valve replacement per CT surgery, due to history of drug abuse. Found to have right sterno-clavicular septic arthritis. Plan to follow TEE within 4 weeks of therapy.   Needs placement to complete IV antibiotic therapy. April 15, 2017.    Assessment & Plan:   Principal Problem:   MSSA bacteremia Active Problems:   Sepsis affecting skin (HCC)   Pelvic pain   Cellulitis of left hand   Staphylococcus aureus bacteremia with sepsis (HCC)   Weakness of both lower extremities   Pain of right clavicle   IVDU (intravenous drug user)   Septic arthritis of right sternoclavicular joint (HCC)   PFO (patent foramen ovale)   Tricuspid valve vegetation  1. Tricuspid valve endocarditiswithsevere TR and PFO. Positive tricuspid vegetation with risk of embolization to the arterial circulation, due to patent foramen ovale. Blood cultures negative from 02/17/2017. IVantibiotic therapy with Nafcillin, to complete on April 15, 2017. Follow TEE within the next 4 weeks. Social services for discharge planing. Patient is high risk for complications with home IV antibiotic therapy. Will need facility.   2. Right sterno-clavicular septic arthritis.Imaging ( CT) in the next 4 weeks.   3. IV drug abuse. On suboxone with no signs of withdrawal.  4. Depression.Oncymbalta and olanzapine, tolerating well.    DVT prophylaxis:enoxaparin Code Status:full Family Communication:no family at the bedside Disposition Plan:home   Consultants:    Procedures:    Antimicrobials:  Subjective: Patient with no diarrhea, no nausea or vomiting, no chest pain or dyspnea.   Objective: Vitals:   03/01/17 0522 03/01/17 1627 03/01/17 2140 03/02/17 0527  BP: 107/75 100/67 120/83 132/82  Pulse: (!) 59 (!) 56 72 68  Resp: 16 16 17 17   Temp: 97.8 F (36.6 C) 98.4 F (36.9 C) 98 F (36.7 C) 98.2 F (36.8 C)  TempSrc: Oral Oral    SpO2: 98% 99% 100% 98%  Weight:      Height:        Intake/Output Summary (Last 24 hours) at 03/02/2017 1001 Last data filed at 03/02/2017 0806 Gross per 24 hour  Intake 1300 ml  Output 1800 ml  Net -500 ml   Filed Weights   02/13/17 2120 02/17/17 1500  Weight: 76.5 kg (168 lb 10.4 oz) 80.7 kg (177 lb 14.6 oz)    Examination:   General: Not  in pain or dyspnea.  Neurology: Awake and alert, non focal  E ENT: no pallor, no icterus, oral mucosa moist Cardiovascular: No JVD. S1-S2 present, rhythmic, no gallops, rubs, or murmurs. No lower extremity edema. Pulmonary: vesicular breath  sounds bilaterally, adequate air movement, no wheezing, rhonchi or rales. Gastrointestinal. Abdomen flat, no organomegaly, non tender, no rebound or guarding Skin. No rashes Musculoskeletal: no joint deformities     Data Reviewed: I have personally reviewed following labs and imaging studies  CBC: Recent Labs  Lab 02/24/17 0406 02/25/17 0410 02/26/17 0448 02/27/17 0406 02/28/17 0417  WBC 6.9 8.4 8.3 6.4 7.6  NEUTROABS 3.9 5.3 4.5 2.9 4.1  HGB 8.2* 8.0* 8.6* 7.8* 8.5*  HCT 25.4* 25.2* 27.0* 24.8* 27.1*  MCV 88.2 89.0 89.7 90.2 90.6  PLT 488* 524* 513* 455* 537*   Basic Metabolic Panel: Recent Labs  Lab 02/24/17 0406 02/25/17 0410 02/26/17 0448 02/27/17 0406 02/28/17 0417  NA 135 135 136 139 137  K 3.7 3.9 4.6 3.7 3.7  CL 99* 98* 101 105 102  CO2 24 25 23 24 24   GLUCOSE 98 87 96 98 97  BUN 12 11 9 9 7   CREATININE 0.87 0.77 0.86 0.87 0.82  CALCIUM 8.8* 8.7* 8.7* 8.3* 8.8*   GFR: Estimated Creatinine Clearance: 107.6 mL/min (by C-G formula based on SCr of 0.82 mg/dL). Liver Function Tests: No results for input(s): AST, ALT, ALKPHOS, BILITOT, PROT, ALBUMIN in the last 168 hours. No results for input(s): LIPASE, AMYLASE in the last 168 hours. No results for input(s): AMMONIA in the last 168 hours. Coagulation Profile: No results for input(s): INR, PROTIME in the last 168 hours. Cardiac Enzymes: No results for input(s): CKTOTAL, CKMB, CKMBINDEX, TROPONINI in the last 168 hours. BNP (last 3 results) No results for input(s): PROBNP in the last 8760 hours. HbA1C: No results for input(s): HGBA1C in the last 72 hours. CBG: No results for input(s): GLUCAP in the last 168 hours. Lipid Profile: No results for input(s): CHOL, HDL, LDLCALC, TRIG, CHOLHDL, LDLDIRECT in the last 72 hours. Thyroid Function Tests: No results for input(s): TSH, T4TOTAL, FREET4, T3FREE, THYROIDAB in the last 72 hours. Anemia Panel: No results for input(s): VITAMINB12, FOLATE, FERRITIN, TIBC,  IRON, RETICCTPCT in the last 72 hours.    Radiology Studies: I have reviewed all of the imaging during this hospital visit personally     Scheduled Meds: . buprenorphine-naloxone  2 tablet Sublingual Daily  . DULoxetine  60 mg Oral BID  . enoxaparin (LOVENOX) injection  40 mg Subcutaneous Q24H  . feeding supplement (ENSURE ENLIVE)  237 mL Oral BID BM  . ferrous sulfate  325 mg Oral TID WC  . gadobenate dimeglumine  15 mL Intravenous Once  . multivitamin with minerals  1 tablet Oral Daily  . OLANZapine  5 mg Oral q morning - 10a  . polyethylene glycol  17 g Oral Daily  . senna-docusate  1 tablet Oral BID   Continuous Infusions: . nafcillin IV 2 g (03/02/17 0811)     LOS: 17 days        Adelaide Pfefferkorn Annett Gula, MD [facility] Pager [phone]

## 2017-03-03 MED ORDER — SODIUM CHLORIDE 0.9% FLUSH
10.0000 mL | INTRAVENOUS | Status: DC | PRN
  Administered 2017-03-10 – 2017-03-16 (×3): 10 mL
  Filled 2017-03-03 (×3): qty 40

## 2017-03-03 MED ORDER — SODIUM CHLORIDE 0.9% FLUSH
10.0000 mL | Freq: Two times a day (BID) | INTRAVENOUS | Status: DC
  Administered 2017-03-03 – 2017-03-11 (×10): 10 mL
  Administered 2017-03-13: 20 mL
  Administered 2017-03-15 – 2017-03-19 (×6): 10 mL

## 2017-03-03 NOTE — Progress Notes (Signed)
Physical Therapy Treatment Patient Details Name: [name] MRN: [id number] DOB: April 02, 1985 Today's Date: 03/03/2017    History of Present Illness Pt is a 32 y.o. female, with past medical history significant for fibromyalgia, anemia and history of IV drug abuse. She presented to the ED with a few days history of fever, chills, and generalized weakness muscle aches in addition to arthralgias.  She was admitted with diagnosis of sepsis.  Pt being treated for endocarditis.     PT Comments    Pt progressing well and has met all functional mobility goals at this time. Updated plan of care, goals, and discharge recommendations to reflect pt progress. Will continue to follow acutely and progress as tolerated to maximize functional mobility and independence prior to discharge home.    Follow Up Recommendations  No PT follow up     Equipment Recommendations  None recommended by PT    Recommendations for Other Services       Precautions / Restrictions Precautions Precautions: Fall Restrictions Weight Bearing Restrictions: No    Mobility  Bed Mobility               General bed mobility comments: pt in recliner chair  Transfers Overall transfer level: Needs assistance   Transfers: Sit to/from Stand Sit to Stand: Supervision         General transfer comment: supervision for management of IV pole  Ambulation/Gait Ambulation/Gait assistance: Supervision Ambulation Distance (Feet): 500 Feet Assistive device: None Gait Pattern/deviations: Step-to pattern;Decreased stride length Gait velocity: decreased(able to increase with VCs) Gait velocity interpretation: Below normal speed for age/gender General Gait Details: pt steady without use of AD, no LOB or assist noted. supervision for management of IV pole   Stairs Stairs: Yes   Stair Management: One rail Left;Step to pattern(for descending only) Number of Stairs: 4 General stair comments: pt guarded and  hesitant  Wheelchair Mobility    Modified Rankin (Stroke Patients Only)       Balance Overall balance assessment: Independent Sitting-balance support: No upper extremity supported;Feet supported Sitting balance-Leahy Scale: Good     Standing balance support: During functional activity;No upper extremity supported Standing balance-Leahy Scale: Good                   Standardized Balance Assessment Standardized Balance Assessment : Dynamic Gait Index   Dynamic Gait Index Level Surface: Mild Impairment(slighlty decreased speed) Change in Gait Speed: Mild Impairment(able to moderately alter cadence) Gait with Horizontal Head Turns: Normal Gait with Vertical Head Turns: Normal Gait and Pivot Turn: Normal Step Over Obstacle: Normal Step Around Obstacles: Normal Steps: Mild Impairment(step to pattern, rail for descending) Total Score: 21      Cognition Arousal/Alertness: Awake/alert Behavior During Therapy: WFL for tasks assessed/performed Overall Cognitive Status: Within Functional Limits for tasks assessed                                        Exercises      General Comments        Pertinent Vitals/Pain Pain Assessment: No/denies pain    Home Living                      Prior Function            PT Goals (current goals can now be found in the care plan section) Acute Rehab PT Goals Patient Stated  Goal: get better PT Goal Formulation: With patient Time For Goal Achievement: 03/17/17 Potential to Achieve Goals: Good Progress towards PT goals: Goals met and updated - see care plan    Frequency    Min 1X/week      PT Plan Frequency needs to be updated;Discharge plan needs to be updated    Co-evaluation              AM-PAC PT "6 Clicks" Daily Activity  Outcome Measure  Difficulty turning over in bed (including adjusting bedclothes, sheets and blankets)?: None Difficulty moving from lying on back to sitting  on the side of the bed? : None Difficulty sitting down on and standing up from a chair with arms (e.g., wheelchair, bedside commode, etc,.)?: None Help needed moving to and from a bed to chair (including a wheelchair)?: None Help needed walking in hospital room?: None Help needed climbing 3-5 steps with a railing? : A Little 6 Click Score: 23    End of Session Equipment Utilized During Treatment: Gait belt Activity Tolerance: Patient tolerated treatment well Patient left: in chair;with call bell/phone within reach Nurse Communication: Mobility status PT Visit Diagnosis: Muscle weakness (generalized) (M62.81);Difficulty in walking, not elsewhere classified (R26.2)     Time: 7409-7964 PT Time Calculation (min) (ACUTE ONLY): 12 min  Charges:  $Gait Training: 8-22 mins                    G Codes:       Vic Ripper, SPT   Vic Ripper 03/03/2017, 5:25 PM

## 2017-03-03 NOTE — Progress Notes (Signed)
Peripherally Inserted Central Catheter/Midline Placement  The IV Nurse has discussed with the patient and/or persons authorized to consent for the patient, the purpose of this procedure and the potential benefits and risks involved with this procedure.  The benefits include less needle sticks, lab draws from the catheter, and the patient may be discharged home with the catheter. Risks include, but not limited to, infection, bleeding, blood clot (thrombus formation), and puncture of an artery; nerve damage and irregular heartbeat and possibility to perform a PICC exchange if needed/ordered by physician.  Alternatives to this procedure were also discussed.  Bard Power PICC patient education guide, fact sheet on infection prevention and patient information card has been provided to patient /or left at bedside.    PICC/Midline Placement Documentation  PICC Single Lumen 03/03/17 PICC Right Brachial 40 cm (Active)  Indication for Insertion or Continuance of Line Poor Vasculature-patient has had multiple peripheral attempts or PIVs lasting less than 24 hours 03/03/2017  8:40 AM  Exposed Catheter (cm) 0 cm 03/03/2017  8:40 AM  Site Assessment Clean;Dry;Intact 03/03/2017  8:40 AM  Line Status Blood return noted 03/03/2017  8:40 AM  Dressing Type Transparent;Securing device 03/03/2017  8:40 AM  Dressing Status Clean;Dry;Intact;Antimicrobial disc in place 03/03/2017  8:40 AM  Dressing Change Due 03/10/17 03/03/2017  8:40 AM  R SL PICC inserted by Stacie Glazeobin Joyce RN   Romie Jumperlford, Domingo Fuson Terry 03/03/2017, 8:42 AM

## 2017-03-03 NOTE — Progress Notes (Signed)
PROGRESS NOTE    [name]  ZOX:[id number] DOB: 09/30/1985 DOA: 02/13/2017 PCP: Pearson Grippe, MD    Brief Narrative:  32 year old female who presented with a chief complaint weakness. She does have a significant past medical history for fibromyalgia, chronic anemia and history intravenous drug abuse.Complaint of fever, chills, generalized weakness for the last 2 days prior to hospitalization.She had erythema and petechiae on her left hand and bilateral lower extremities.On initial physical examination blood pressure 86/52, heart rate 70, temperature 100.7, respiratory 22 and oxygen saturation 97%.Lungs wereclear to auscultation bilaterally, no wheezing, rales or rhonchi, heart S1-S2 present, rhythmic, no gallops, rubs or murmurs, the abdomen was soft nontender,lower extremities with edema and petechiae lesions.Sodium 130, potassium 3.7, sodium 88, bicarb 27, glucose 107, BUN 9, creatinine 0.72,white count 14.4, hemoglobin 11.5, hematocrit 33.1, platelets 109.Urinalysis too numerous to count RBCs, 6-30 white cells,chest x-ray had bibasilar atelectasis,EKG sinus rhythm, 128 bpm, normal axis, normal intervals.  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 Not candidate for valve replacement per CT surgery, due to history of drug abuse. Found to have right sterno-clavicular septic arthritis. Plan to follow TEE within 4 weeks of therapy.   Needs placement to complete IV antibiotic therapy. April 15, 2017.    Assessment & Plan:   Principal Problem:   MSSA bacteremia Active Problems:   Sepsis affecting skin (HCC)   Pelvic pain   Cellulitis of left hand   Staphylococcus aureus bacteremia with sepsis (HCC)   Weakness of both lower extremities   Pain of right clavicle   IVDU (intravenous drug  user)   Septic arthritis of right sternoclavicular joint (HCC)   PFO (patent foramen ovale)   Tricuspid valve vegetation   1. Tricuspid valve endocarditiswithsevere TR and PFO.Positive tricuspid vegetation with risk of embolization to the arterial circulation, due to patent foramen ovale.Blood cultures negative from 02/17/2017.ContinueIVantibiotic therapy with IV Nafcillin,scheduled untilApril 2, 2019. For followwill need TEE within the next 4 weeks.  2. Right sterno-clavicular septic arthritis.Imaging ( CT) in the next 4 weeks. Pain is well controlled.   3. IV drug abuse. Continue suboxone, tolerating well.  4. Depression. Continuecymbalta and olanzapine.   DVT prophylaxis:enoxaparin Code Status:full Family Communication:no family at the bedside Disposition Plan:home   Consultants:    Procedures:    Antimicrobials:     Subjective: Patient doing well, no nausea or vomiting, no dyspnea or chest pain.   Objective: Vitals:   03/02/17 0527 03/02/17 1540 03/02/17 2249 03/03/17 0500  BP: 132/82 99/65 107/65 126/79  Pulse: 68 66 81 60  Resp: 17 16 17 18   Temp: 98.2 F (36.8 C) 98.7 F (37.1 C) 98.5 F (36.9 C) 98.1 F (36.7 C)  TempSrc:  Oral Oral Oral  SpO2: 98% 100% 96% 100%  Weight:      Height:        Intake/Output Summary (Last 24 hours) at 03/03/2017 1106 Last data filed at 03/03/2017 0308 Gross per 24 hour  Intake 300 ml  Output 500 ml  Net -200 ml   Filed Weights   02/13/17 2120 02/17/17 1500  Weight: 76.5 kg (168 lb 10.4 oz) 80.7 kg (177 lb 14.6 oz)    Examination:   General: Not in pain or dyspnea.  Neurology: Awake and alert, non focal  E ENT: no pallor, no icterus, oral mucosa moist Cardiovascular:  No JVD. S1-S2 present, rhythmic, no gallops, rubs, or murmurs. No lower extremity edema. Pulmonary: vesicular breath sounds bilaterally, adequate air movement, no wheezing, rhonchi or rales. Gastrointestinal.  Abdomen flat, no organomegaly, non tender, no rebound or guarding Skin. No rashes Musculoskeletal: no joint deformities     Data Reviewed: I have personally reviewed following labs and imaging studies  CBC: Recent Labs  Lab 02/25/17 0410 02/26/17 0448 02/27/17 0406 02/28/17 0417  WBC 8.4 8.3 6.4 7.6  NEUTROABS 5.3 4.5 2.9 4.1  HGB 8.0* 8.6* 7.8* 8.5*  HCT 25.2* 27.0* 24.8* 27.1*  MCV 89.0 89.7 90.2 90.6  PLT 524* 513* 455* 537*   Basic Metabolic Panel: Recent Labs  Lab 02/25/17 0410 02/26/17 0448 02/27/17 0406 02/28/17 0417  NA 135 136 139 137  K 3.9 4.6 3.7 3.7  CL 98* 101 105 102  CO2 25 23 24 24   GLUCOSE 87 96 98 97  BUN 11 9 9 7   CREATININE 0.77 0.86 0.87 0.82  CALCIUM 8.7* 8.7* 8.3* 8.8*   GFR: Estimated Creatinine Clearance: 107.6 mL/min (by C-G formula based on SCr of 0.82 mg/dL). Liver Function Tests: No results for input(s): AST, ALT, ALKPHOS, BILITOT, PROT, ALBUMIN in the last 168 hours. No results for input(s): LIPASE, AMYLASE in the last 168 hours. No results for input(s): AMMONIA in the last 168 hours. Coagulation Profile: No results for input(s): INR, PROTIME in the last 168 hours. Cardiac Enzymes: No results for input(s): CKTOTAL, CKMB, CKMBINDEX, TROPONINI in the last 168 hours. BNP (last 3 results) No results for input(s): PROBNP in the last 8760 hours. HbA1C: No results for input(s): HGBA1C in the last 72 hours. CBG: No results for input(s): GLUCAP in the last 168 hours. Lipid Profile: No results for input(s): CHOL, HDL, LDLCALC, TRIG, CHOLHDL, LDLDIRECT in the last 72 hours. Thyroid Function Tests: No results for input(s): TSH, T4TOTAL, FREET4, T3FREE, THYROIDAB in the last 72 hours. Anemia Panel: No results for input(s): VITAMINB12, FOLATE, FERRITIN, TIBC, IRON, RETICCTPCT in the last 72 hours.    Radiology Studies: I have reviewed all of the imaging during this hospital visit personally     Scheduled Meds: .  buprenorphine-naloxone  2 tablet Sublingual Daily  . DULoxetine  60 mg Oral BID  . enoxaparin (LOVENOX) injection  40 mg Subcutaneous Q24H  . feeding supplement (ENSURE ENLIVE)  237 mL Oral BID BM  . ferrous sulfate  325 mg Oral TID WC  . gadobenate dimeglumine  15 mL Intravenous Once  . linezolid  600 mg Oral Q12H  . multivitamin with minerals  1 tablet Oral Daily  . OLANZapine  5 mg Oral q morning - 10a  . polyethylene glycol  17 g Oral Daily  . senna-docusate  1 tablet Oral BID  . sodium chloride flush  10-40 mL Intracatheter Q12H   Continuous Infusions: . nafcillin IV 2 g (03/03/17 0920)     LOS: 18 days         Annett Gulaaniel , MD [facility] Pager [phone]

## 2017-03-04 NOTE — Progress Notes (Signed)
Nutrition Follow-up  DOCUMENTATION CODES:   Not applicable  INTERVENTION:  D/C Ensure Enlive po BID, each supplement provides 350 kcal and 20 grams of protein  Continue multivitamin with minerals  Magic cup TID with meals, each supplement provides 290 kcal and 9 grams of protein  Recommend daily weights  NUTRITION DIAGNOSIS:   Inadequate oral intake related to lethargy/confusion, poor appetite as evidenced by meal completion < 50%. -resolved  GOAL:   Patient will meet greater than or equal to 90% of their needs -met  MONITOR:   PO intake, Supplement acceptance, Labs, Weight trends, Skin, I & O's  ASSESSMENT:   32 y.o. female w/ a hx of fibromyalgia, anemia, and IV drug abuse who presented with a few days history of fever, chills, generalized weakness muscle aches, and arthralgias.  In the emergency room the patient was noted to have fever, leukocytosis and left hand and bilateral lower extremity redness and petechiae.    Now eating 100% at each meal. Does not like Ensure. Will try magic cup but doing ok otherwise. Weight up since admission. Continues on nafcillin gtt  Labs reviewed Medications reviewed and include:  Iron, MVI w/ Minerals, Miralax, Senokot-S  Diet Order:  Diet Heart Room service appropriate? Yes; Fluid consistency: Thin  EDUCATION NEEDS:   Education needs have been addressed  Skin:  Skin Assessment: Reviewed RN Assessment  Last BM:  03/03/2017  Height:   Ht Readings from Last 1 Encounters:  02/17/17 '5\' 7"'  (1.702 m)    Weight:   Wt Readings from Last 1 Encounters:  02/17/17 177 lb 14.6 oz (80.7 kg)    Ideal Body Weight:  61.4 kg  BMI:  Body mass index is 27.86 kg/m.  Estimated Nutritional Needs:   Kcal:  1700-1900  Protein:  90-105 grams  Fluid:  1.7-1.9 L  [name]. Tammy Lutz, MS, RD LDN Inpatient Clinical Dietitian Pager [phone]

## 2017-03-04 NOTE — Progress Notes (Signed)
PROGRESS NOTE    [name]  ZOX:[id number] DOB: 01/23/1985 DOA: 02/13/2017 PCP: Pearson GrippeKim, James, MD    Brief Narrative:  32 year old female who presented with a chief complaint weakness. She does have a significant past medical history for fibromyalgia, chronic anemia and history intravenous drug abuse.Complaint of fever, chills, generalized weakness for the last 2 days prior to hospitalization.She had erythema and petechiae on her left hand and bilateral lower extremities.On initial physical examination blood pressure 86/52, heart rate 70, temperature 100.7, respiratory 22 and oxygen saturation 97%.Lungs wereclear to auscultation bilaterally, no wheezing, rales or rhonchi, heart S1-S2 present, rhythmic, no gallops, rubs or murmurs, the abdomen was soft nontender,lower extremities with edema and petechiae lesions.Sodium 130, potassium 3.7, sodium 88, bicarb 27, glucose 107, BUN 9, creatinine 0.72,white count 14.4, hemoglobin 11.5, hematocrit 33.1, platelets 109.Urinalysis too numerous to count RBCs, 6-30 white cells,chest x-ray had bibasilar atelectasis,EKG sinus rhythm, 128 bpm, normal axis, normal intervals.  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 Not candidate for valve replacement per CT surgery, due to history of drug abuse. Found to have right sterno-clavicular septic arthritis. Plan to follow TEE within 4 weeks of therapy.   Needs placement to complete IV antibiotic therapy. April 15, 2017.   Assessment & Plan:   Principal Problem:   MSSA bacteremia Active Problems:   Sepsis affecting skin (HCC)   Pelvic pain   Cellulitis of left hand   Staphylococcus aureus bacteremia with sepsis (HCC)   Weakness of both lower extremities   Pain of right clavicle   IVDU (intravenous drug  user)   Septic arthritis of right sternoclavicular joint (HCC)   PFO (patent foramen ovale)   Tricuspid valve vegetation  1. Tricuspid valve endocarditiswithsevere TR and PFO.Positive tricuspid vegetation with risk of embolization to the arterial circulation, due to patent foramen ovale.Blood cultures negative from 02/17/2017.Tolerating wellIVantibiotic therapy with IVNafcillin, plan to continue untilApril 2, 2019. Will need TEE within the next 4 weeks, off negative cultures per ID recommendations  2. Right sterno-clavicular septic arthritis.Imaging (CT)within the next 4 weeks.Pain is well controlled.   3. IV drug abuse. On suboxone, tolerating well.  4. Depression. Oncymbalta and olanzapine.   5. Iron deficiency anemia. Continue iron supplements.   DVT prophylaxis:enoxaparin Code Status:full Family Communication:no family at the bedside Disposition Plan:home   Consultants:    Procedures:    Antimicrobials   Subjective: Patient with no complains, have been out of bed and tolerating po well, no nausea or vomiting, no diarrhea or abdominal pain.   Objective: Vitals:   03/03/17 0500 03/03/17 1436 03/03/17 2157 03/04/17 0559  BP: 126/79 108/71 105/71 123/77  Pulse: 60 65 77 62  Resp: 18 18 16 16   Temp: 98.1 F (36.7 C) 97.8 F (36.6 C) 98 F (36.7 C) 97.8 F (36.6 C)  TempSrc: Oral Oral  Oral  SpO2: 100% 100% 100% 99%  Weight:      Height:        Intake/Output Summary (Last 24 hours) at 03/04/2017 1231 Last data filed at 03/04/2017 1221 Gross per 24 hour  Intake 940 ml  Output 2400 ml  Net -1460 ml   Filed Weights   02/13/17 2120 02/17/17 1500  Weight: 76.5 kg (168 lb 10.4 oz) 80.7 kg (177 lb 14.6 oz)    Examination:   General: Not in pain  or dyspnea Neurology: Awake and alert, non focal  E ENT: no pallor, no icterus, oral mucosa moist Cardiovascular: No JVD. S1-S2 present, rhythmic, no gallops, rubs, or murmurs. No  lower extremity edema. Pulmonary: vesicular breath sounds bilaterally, adequate air movement, no wheezing, rhonchi or rales. Gastrointestinal. Abdomen flat, no organomegaly, non tender, no rebound or guarding Skin. No rashes Musculoskeletal: no joint deformities     Data Reviewed: I have personally reviewed following labs and imaging studies  CBC: Recent Labs  Lab 02/26/17 0448 02/27/17 0406 02/28/17 0417  WBC 8.3 6.4 7.6  NEUTROABS 4.5 2.9 4.1  HGB 8.6* 7.8* 8.5*  HCT 27.0* 24.8* 27.1*  MCV 89.7 90.2 90.6  PLT 513* 455* 537*   Basic Metabolic Panel: Recent Labs  Lab 02/26/17 0448 02/27/17 0406 02/28/17 0417  NA 136 139 137  K 4.6 3.7 3.7  CL 101 105 102  CO2 23 24 24   GLUCOSE 96 98 97  BUN 9 9 7   CREATININE 0.86 0.87 0.82  CALCIUM 8.7* 8.3* 8.8*   GFR: Estimated Creatinine Clearance: 107.6 mL/min (by C-G formula based on SCr of 0.82 mg/dL). Liver Function Tests: No results for input(s): AST, ALT, ALKPHOS, BILITOT, PROT, ALBUMIN in the last 168 hours. No results for input(s): LIPASE, AMYLASE in the last 168 hours. No results for input(s): AMMONIA in the last 168 hours. Coagulation Profile: No results for input(s): INR, PROTIME in the last 168 hours. Cardiac Enzymes: No results for input(s): CKTOTAL, CKMB, CKMBINDEX, TROPONINI in the last 168 hours. BNP (last 3 results) No results for input(s): PROBNP in the last 8760 hours. HbA1C: No results for input(s): HGBA1C in the last 72 hours. CBG: No results for input(s): GLUCAP in the last 168 hours. Lipid Profile: No results for input(s): CHOL, HDL, LDLCALC, TRIG, CHOLHDL, LDLDIRECT in the last 72 hours. Thyroid Function Tests: No results for input(s): TSH, T4TOTAL, FREET4, T3FREE, THYROIDAB in the last 72 hours. Anemia Panel: No results for input(s): VITAMINB12, FOLATE, FERRITIN, TIBC, IRON, RETICCTPCT in the last 72 hours.    Radiology Studies: I have reviewed all of the imaging during this hospital visit  personally     Scheduled Meds: . buprenorphine-naloxone  2 tablet Sublingual Daily  . DULoxetine  60 mg Oral BID  . enoxaparin (LOVENOX) injection  40 mg Subcutaneous Q24H  . ferrous sulfate  325 mg Oral TID WC  . gadobenate dimeglumine  15 mL Intravenous Once  . multivitamin with minerals  1 tablet Oral Daily  . OLANZapine  5 mg Oral q morning - 10a  . polyethylene glycol  17 g Oral Daily  . senna-docusate  1 tablet Oral BID  . sodium chloride flush  10-40 mL Intracatheter Q12H   Continuous Infusions: . nafcillin IV 2 g (03/04/17 1207)     LOS: 19 days        Dai Mcadams Annett Gula, MD [facility] Pager [phone]

## 2017-03-04 NOTE — Progress Notes (Signed)
Occupational Therapy Treatment and Discharge Patient Details Name: [name] MRN: [id number] DOB: 07/02/1985 Today's Date: 03/04/2017    History of present illness Pt is a 32 y.o. female, with past medical history significant for fibromyalgia, anemia and history of IV drug abuse. She presented to the ED with a few days history of fever, chills, and generalized weakness muscle aches in addition to arthralgias.  She was admitted with diagnosis of sepsis.  Pt being treated for endocarditis.    OT comments  Pt is consistently functioning independently in self care and ambulating in halls managing her own IV pole. No further OT needs.  Follow Up Recommendations  No OT follow up    Equipment Recommendations  None recommended by OT    Recommendations for Other Services      Precautions / Restrictions Precautions Precautions: None Restrictions Weight Bearing Restrictions: No       Mobility Bed Mobility               General bed mobility comments: pt in recliner chair  Transfers Overall transfer level: Independent Equipment used: (pushed IV pole)                  Balance                                           ADL either performed or assessed with clinical judgement   ADL Overall ADL's : Independent                                     Functional mobility during ADLs: Independent(pushes her own IV pole) General ADL Comments: Pt is consistently performing her own bathing, dressing and toileting.      Vision       Perception     Praxis      Cognition Arousal/Alertness: Awake/alert Behavior During Therapy: WFL for tasks assessed/performed Overall Cognitive Status: Within Functional Limits for tasks assessed                                          Exercises     Shoulder Instructions       General Comments      Pertinent Vitals/ Pain       Pain Assessment: No/denies pain  Home Living                                           Prior Functioning/Environment              Frequency           Progress Toward Goals  OT Goals(current goals can now be found in the care plan section)  Progress towards OT goals: Goals met/education completed, patient discharged from [place] All goals met and education completed, patient discharged from OT services    Co-evaluation                 AM-PAC PT "6 Clicks" Daily Activity     Outcome Measure   Help from another person eating meals?: None Help from another  person taking care of personal grooming?: None Help from another person toileting, which includes using toliet, bedpan, or urinal?: None Help from another person bathing (including washing, rinsing, drying)?: None Help from another person to put on and taking off regular upper body clothing?: None Help from another person to put on and taking off regular lower body clothing?: None 6 Click Score: 24    End of Session Equipment Utilized During Treatment: Gait belt      Activity Tolerance Patient tolerated treatment well   Patient Left in chair;with call bell/phone within reach;with nursing/sitter in room   Nurse Communication          Time: 4196-2229 OT Time Calculation (min): 10 min  Charges: OT General Charges $OT Visit: 1 Visit OT Treatments $Self Care/Home Management : 8-22 mins  03/04/2017 Nestor Lewandowsky, OTR/L Pager: [place], [name] 03/04/2017, 10:45 AM

## 2017-03-05 NOTE — Progress Notes (Signed)
PROGRESS NOTE [facility]   [name]   RUE:[id number] DOB: 1985-12-30  DOA: 02/13/2017 PCP: Pearson Grippe, MD   Brief Narrative:  [name] 32 year old female who presented with a chief complaint weakness. She does have a significant past medical history for fibromyalgia, chronic anemia and history intravenous drug abuse.Complaint of fever, chills, generalized weakness for the last 2 days prior to hospitalization.She had erythema and petechiae on her left hand and bilateral lower extremities. 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 Not candidate for valve replacement per CT surgery, due to history of drug abuse. Found to have right sterno-clavicular septic arthritis. Plan to follow TEE within 4 weeks of therapy.   Needs complete IV antibiotic therapy. April 15, 2017.  Subjective: Patient seen and examined she has no complaints.  Denies chest, shortness of breath and palpitation.  Remains afebrile, ambulating and tolerating diet well.  Assessment & Plan: MSSA bacteremia.  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  Repeated blood cultures on 2/4 2019.  ID recommending IV nafcillin until April 15, 2017.  Will need TEE 4-week after negative culture.  Patient remains afebrile and tolerating therapy well.  PICC line in place.   Right sternoclavicular septic arthritis CT surgery recommending follow-up as an outpatient in the next 4 weeks  IV drug user On Suboxone  Depression On Cymbalta and olanzapine  Iron deficiency anemia Continue iron supplementation  DVT  prophylaxis: Lovenox Code Status: Full code Family Communication: No family at bedside Disposition Plan: Home when finish antibiotic therapy  Consultants:     Procedures:     Antimicrobials: Anti-infectives (From admission, onward)   Start     Dose/Rate Route Frequency Ordered Stop   03/02/17 2200  linezolid (ZYVOX) tablet 600 mg  Status:  Discontinued     600 mg Oral Every 12 hours 03/02/17 2037 03/03/17 1203   02/24/17 1600  nafcillin 2 g in sodium chloride 0.9 % 100 mL IVPB     2 g 200 mL/hr over 30 Minutes Intravenous Every 4 hours 02/24/17 0833     02/21/17 2000  nafcillin 2 g in dextrose 5 % 100 mL IVPB  Status:  Discontinued     2 g 200 mL/hr over 30 Minutes Intravenous Every 4 hours 02/21/17 1741 02/24/17 0910   02/21/17 1730  nafcillin injection 2 g  Status:  Discontinued     2 g Intravenous Every 4 hours 02/21/17 1726 02/21/17 1740   02/19/17 1230  ceFAZolin (ANCEF) IVPB 2g/100 mL premix  Status:  Discontinued     2 g 200 mL/hr over 30 Minutes Intravenous Every 8 hours 02/19/17 1148 02/21/17 1726   02/18/17 1330  linezolid (ZYVOX) tablet 600 mg  Status:  Discontinued     600 mg Oral Every 12 hours 02/18/17 1250 02/19/17 1126   02/17/17 0800  sulfamethoxazole-trimethoprim (BACTRIM DS,SEPTRA DS) 800-160 MG per tablet 2 tablet  Status:  Discontinued     2 tablet Oral Every 12 hours 02/16/17 1453 02/18/17 1250   02/16/17 1600  sulfamethoxazole-trimethoprim (BACTRIM DS,SEPTRA DS) 800-160 MG per tablet 2 tablet     2 tablet Oral  Once 02/16/17 1509 02/16/17 1657  02/14/17 1400  ceFAZolin (ANCEF) IVPB 2g/100 mL premix  Status:  Discontinued     2 g 200 mL/hr over 30 Minutes Intravenous Every 8 hours 02/14/17 1013 02/16/17 1453   02/14/17 0200  vancomycin (VANCOCIN) IVPB 750 mg/150 ml premix  Status:  Discontinued     750 mg 150 mL/hr over 60 Minutes Intravenous Every 8 hours 02/13/17 1739 02/14/17 1017   02/14/17 0000  piperacillin-tazobactam (ZOSYN) IVPB 3.375 g  Status:   Discontinued     3.375 g 12.5 mL/hr over 240 Minutes Intravenous Every 8 hours 02/13/17 1739 02/14/17 1013   02/13/17 1715  piperacillin-tazobactam (ZOSYN) IVPB 3.375 g     3.375 g 100 mL/hr over 30 Minutes Intravenous  Once 02/13/17 1701 02/13/17 1824   02/13/17 1715  vancomycin (VANCOCIN) IVPB 1000 mg/200 mL premix  Status:  Discontinued     1,000 mg 200 mL/hr over 60 Minutes Intravenous  Once 02/13/17 1701 02/13/17 1705   02/13/17 1715  vancomycin (VANCOCIN) 1,500 mg in sodium chloride 0.9 % 500 mL IVPB     1,500 mg 250 mL/hr over 120 Minutes Intravenous  Once 02/13/17 1705 02/13/17 2300       Objective: Vitals:   03/04/17 1319 03/04/17 2209 03/05/17 0609 03/05/17 1501  BP: 113/73 (!) 105/55 117/81 102/65  Pulse: 69 96 69 75  Resp: 16 16 16 18   Temp: 97.8 F (36.6 C) 98.2 F (36.8 C) 98.4 F (36.9 C) 97.7 F (36.5 C)  TempSrc: Oral Oral Oral   SpO2: 100% 97% 97% 98%  Weight:      Height:        Intake/Output Summary (Last 24 hours) at 03/05/2017 1538 Last data filed at 03/05/2017 1151 Gross per 24 hour  Intake 400 ml  Output -  Net 400 ml   Filed Weights   02/13/17 2120 02/17/17 1500  Weight: 76.5 kg (168 lb 10.4 oz) 80.7 kg (177 lb 14.6 oz)    Examination:  General exam: Appears calm and comfortable  Respiratory system: Clear to auscultation. No wheezes,crackle or rhonchi Cardiovascular system: S1 & S2 heard, RRR. No JVD, murmurs, rubs or gallops Gastrointestinal system: Abdomen is nondistended, soft and nontender.  Central nervous system: Alert and oriented. No focal neurological deficits. Extremities: No pedal edema.  Skin: No rashes, lesions or ulcers Psychiatry: Mood & affect appropriate.   Data Reviewed: I have personally reviewed following labs and imaging studies  CBC: Recent Labs  Lab 02/27/17 0406 02/28/17 0417  WBC 6.4 7.6  NEUTROABS 2.9 4.1  HGB 7.8* 8.5*  HCT 24.8* 27.1*  MCV 90.2 90.6  PLT 455* 537*   Basic Metabolic Panel: Recent  Labs  Lab 02/27/17 0406 02/28/17 0417  NA 139 137  K 3.7 3.7  CL 105 102  CO2 24 24  GLUCOSE 98 97  BUN 9 7  CREATININE 0.87 0.82  CALCIUM 8.3* 8.8*   GFR: Estimated Creatinine Clearance: 107.6 mL/min (by C-G formula based on SCr of 0.82 mg/dL). Liver Function Tests: No results for input(s): AST, ALT, ALKPHOS, BILITOT, PROT, ALBUMIN in the last 168 hours. No results for input(s): LIPASE, AMYLASE in the last 168 hours. No results for input(s): AMMONIA in the last 168 hours. Coagulation Profile: No results for input(s): INR, PROTIME in the last 168 hours. Cardiac Enzymes: No results for input(s): CKTOTAL, CKMB, CKMBINDEX, TROPONINI in the last 168 hours. BNP (last 3 results) No results for input(s): PROBNP in the last 8760 hours. HbA1C: No results for input(s): HGBA1C in  the last 72 hours. CBG: No results for input(s): GLUCAP in the last 168 hours. Lipid Profile: No results for input(s): CHOL, HDL, LDLCALC, TRIG, CHOLHDL, LDLDIRECT in the last 72 hours. Thyroid Function Tests: No results for input(s): TSH, T4TOTAL, FREET4, T3FREE, THYROIDAB in the last 72 hours. Anemia Panel: No results for input(s): VITAMINB12, FOLATE, FERRITIN, TIBC, IRON, RETICCTPCT in the last 72 hours. Sepsis Labs: No results for input(s): PROCALCITON, LATICACIDVEN in the last 168 hours.  No results found for this or any previous visit (from the past 240 hour(s)).    Radiology Studies: No results found.    Scheduled Meds: . buprenorphine-naloxone  2 tablet Sublingual Daily  . DULoxetine  60 mg Oral BID  . enoxaparin (LOVENOX) injection  40 mg Subcutaneous Q24H  . ferrous sulfate  325 mg Oral TID WC  . gadobenate dimeglumine  15 mL Intravenous Once  . multivitamin with minerals  1 tablet Oral Daily  . OLANZapine  5 mg Oral q morning - 10a  . polyethylene glycol  17 g Oral Daily  . senna-docusate  1 tablet Oral BID  . sodium chloride flush  10-40 mL Intracatheter Q12H   Continuous  Infusions: . nafcillin IV Stopped (03/05/17 1302)     LOS: 20 days    Time spent: Total of 25 minutes spent with pt, greater than 50% of which was spent in discussion of  treatment, counseling and coordination of care   Latrelle DodrillEdwin Silva, MD Pager: Text Page via [email or web address]   If 7PM-7AM, please contact night-coverage [email or web address] 03/05/2017, 3:38 PM   Note - This record has been created using [employer] software. Chart creation errors have been sought, but may not always have been located. Such creation errors do not reflect on the standard of medical care.

## 2017-03-06 NOTE — Progress Notes (Signed)
PROGRESS NOTE [facility]   Tammy ArabCasey M [name]   WGN:[id number] DOB: 07/17/1985  DOA: 02/13/2017 PCP: Pearson GrippeKim, James, MD   Brief Narrative:  [name] 32 year old female who presented with a chief complaint weakness. She does have a significant past medical history for fibromyalgia, chronic anemia and history intravenous drug abuse.Complaint of fever, chills, generalized weakness for the last 2 days prior to hospitalization.She had erythema and petechiae on her left hand and bilateral lower extremities. 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 Not candidate for valve replacement per CT surgery, due to history of drug abuse. Found to have right sterno-clavicular septic arthritis. Plan to follow TEE within 4 weeks of therapy.   Needs complete IV antibiotic therapy. April 15, 2017.  Subjective: Patient seen and examined, no new complaints   Assessment & Plan: MSSA bacteremia.  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  Repeated blood cultures on 2/4 2019.  ID recommending IV nafcillin until April 15, 2017.  Will need TEE 4-week after negative culture at around march 6.  Patient remains afebrile and tolerating therapy well.  PICC line in place. Continue current regimen   Right sternoclavicular septic arthritis CT surgery recommending follow-up as an outpatient in the next 4 weeks  IV drug user On Suboxone  Depression On Cymbalta and olanzapine  Iron deficiency anemia Continue iron supplementation  DVT prophylaxis: Lovenox Code Status: Full code Family Communication: No family  at bedside Disposition Plan: Home when finish antibiotic therapy  Consultants:     Procedures:     Antimicrobials: Anti-infectives (From admission, onward)   Start     Dose/Rate Route Frequency Ordered Stop   03/02/17 2200  linezolid (ZYVOX) tablet 600 mg  Status:  Discontinued     600 mg Oral Every 12 hours 03/02/17 2037 03/03/17 1203   02/24/17 1600  nafcillin 2 g in sodium chloride 0.9 % 100 mL IVPB     2 g 200 mL/hr over 30 Minutes Intravenous Every 4 hours 02/24/17 0833     02/21/17 2000  nafcillin 2 g in dextrose 5 % 100 mL IVPB  Status:  Discontinued     2 g 200 mL/hr over 30 Minutes Intravenous Every 4 hours 02/21/17 1741 02/24/17 0910   02/21/17 1730  nafcillin injection 2 g  Status:  Discontinued     2 g Intravenous Every 4 hours 02/21/17 1726 02/21/17 1740   02/19/17 1230  ceFAZolin (ANCEF) IVPB 2g/100 mL premix  Status:  Discontinued     2 g 200 mL/hr over 30 Minutes Intravenous Every 8 hours 02/19/17 1148 02/21/17 1726   02/18/17 1330  linezolid (ZYVOX) tablet 600 mg  Status:  Discontinued     600 mg Oral Every 12 hours 02/18/17 1250 02/19/17 1126   02/17/17 0800  sulfamethoxazole-trimethoprim (BACTRIM DS,SEPTRA DS) 800-160 MG per tablet 2 tablet  Status:  Discontinued     2 tablet Oral Every 12 hours 02/16/17 1453 02/18/17 1250   02/16/17 1600  sulfamethoxazole-trimethoprim (BACTRIM DS,SEPTRA DS) 800-160 MG per tablet 2 tablet     2 tablet Oral  Once 02/16/17 1509 02/16/17 1657   02/14/17 1400  ceFAZolin (ANCEF) IVPB 2g/100  mL premix  Status:  Discontinued     2 g 200 mL/hr over 30 Minutes Intravenous Every 8 hours 02/14/17 1013 02/16/17 1453   02/14/17 0200  vancomycin (VANCOCIN) IVPB 750 mg/150 ml premix  Status:  Discontinued     750 mg 150 mL/hr over 60 Minutes Intravenous Every 8 hours 02/13/17 1739 02/14/17 1017   02/14/17 0000  piperacillin-tazobactam (ZOSYN) IVPB 3.375 g  Status:  Discontinued     3.375 g 12.5 mL/hr over 240 Minutes Intravenous Every 8  hours 02/13/17 1739 02/14/17 1013   02/13/17 1715  piperacillin-tazobactam (ZOSYN) IVPB 3.375 g     3.375 g 100 mL/hr over 30 Minutes Intravenous  Once 02/13/17 1701 02/13/17 1824   02/13/17 1715  vancomycin (VANCOCIN) IVPB 1000 mg/200 mL premix  Status:  Discontinued     1,000 mg 200 mL/hr over 60 Minutes Intravenous  Once 02/13/17 1701 02/13/17 1705   02/13/17 1715  vancomycin (VANCOCIN) 1,500 mg in sodium chloride 0.9 % 500 mL IVPB     1,500 mg 250 mL/hr over 120 Minutes Intravenous  Once 02/13/17 1705 02/13/17 2300      Objective: Vitals:   03/05/17 2100 03/05/17 2220 03/06/17 0426 03/06/17 0617  BP:  102/60  126/90  Pulse:  79  73  Resp:  16    Temp:  (!) 97.4 F (36.3 C)  97.9 F (36.6 C)  TempSrc:  Axillary    SpO2:  100%  98%  Weight: 75.4 kg (166 lb 3.6 oz)  74.8 kg (164 lb 14.5 oz)   Height:        Intake/Output Summary (Last 24 hours) at 03/06/2017 1251 Last data filed at 03/06/2017 0500 Gross per 24 hour  Intake 620 ml  Output -  Net 620 ml   Filed Weights   02/17/17 1500 03/05/17 2100 03/06/17 0426  Weight: 80.7 kg (177 lb 14.6 oz) 75.4 kg (166 lb 3.6 oz) 74.8 kg (164 lb 14.5 oz)    Examination:  General: NAD Cardiovascular: RRR, S1/S2 +, no rubs, no gallops Respiratory: CTA bilaterally, no wheezing, no rhonchi Extremities: PICC line in place   Data Reviewed: I have personally reviewed following labs and imaging studies  CBC: Recent Labs  Lab 02/28/17 0417  WBC 7.6  NEUTROABS 4.1  HGB 8.5*  HCT 27.1*  MCV 90.6  PLT 537*   Basic Metabolic Panel: Recent Labs  Lab 02/28/17 0417  NA 137  K 3.7  CL 102  CO2 24  GLUCOSE 97  BUN 7  CREATININE 0.82  CALCIUM 8.8*   GFR: Estimated Creatinine Clearance: 104 mL/min (by C-G formula based on SCr of 0.82 mg/dL). Liver Function Tests: No results for input(s): AST, ALT, ALKPHOS, BILITOT, PROT, ALBUMIN in the last 168 hours. No results for input(s): LIPASE, AMYLASE in the last 168 hours. No  results for input(s): AMMONIA in the last 168 hours. Coagulation Profile: No results for input(s): INR, PROTIME in the last 168 hours. Cardiac Enzymes: No results for input(s): CKTOTAL, CKMB, CKMBINDEX, TROPONINI in the last 168 hours. BNP (last 3 results) No results for input(s): PROBNP in the last 8760 hours. HbA1C: No results for input(s): HGBA1C in the last 72 hours. CBG: No results for input(s): GLUCAP in the last 168 hours. Lipid Profile: No results for input(s): CHOL, HDL, LDLCALC, TRIG, CHOLHDL, LDLDIRECT in the last 72 hours. Thyroid Function Tests: No results for input(s): TSH, T4TOTAL, FREET4, T3FREE, THYROIDAB in the last 72 hours. Anemia Panel: No results for input(s): VITAMINB12,  FOLATE, FERRITIN, TIBC, IRON, RETICCTPCT in the last 72 hours. Sepsis Labs: No results for input(s): PROCALCITON, LATICACIDVEN in the last 168 hours.  No results found for this or any previous visit (from the past 240 hour(s)).    Radiology Studies: No results found.   Scheduled Meds: . buprenorphine-naloxone  2 tablet Sublingual Daily  . DULoxetine  60 mg Oral BID  . enoxaparin (LOVENOX) injection  40 mg Subcutaneous Q24H  . ferrous sulfate  325 mg Oral TID WC  . gadobenate dimeglumine  15 mL Intravenous Once  . multivitamin with minerals  1 tablet Oral Daily  . OLANZapine  5 mg Oral q morning - 10a  . polyethylene glycol  17 g Oral Daily  . senna-docusate  1 tablet Oral BID  . sodium chloride flush  10-40 mL Intracatheter Q12H   Continuous Infusions: . nafcillin IV 2 g (03/06/17 1247)     LOS: 21 days    Time spent: Total of 25 minutes spent with pt, greater than 50% of which was spent in discussion of  treatment, counseling and coordination of care  Latrelle Dodrill, MD Pager: Text Page via [email or web address]   If 7PM-7AM, please contact night-coverage [email or web address] 03/06/2017, 12:51 PM   Note - This record has been created using [employer]. Chart creation errors have been  sought, but may not always have been located. Such creation errors do not reflect on the standard of medical care.

## 2017-03-07 DIAGNOSIS — R768 Other specified abnormal immunological findings in serum: Secondary | ICD-10-CM

## 2017-03-07 DIAGNOSIS — I5032 Chronic diastolic (congestive) heart failure: Secondary | ICD-10-CM | POA: Diagnosis present

## 2017-03-07 LAB — BRAIN NATRIURETIC PEPTIDE: B Natriuretic Peptide: 222.5 pg/mL — ABNORMAL HIGH (ref 0.0–100.0)

## 2017-03-07 NOTE — Progress Notes (Addendum)
PROGRESS NOTE  Tammy ArabCasey M Leach ZOX:[id number] DOB: 08/01/1985 DOA: 02/13/2017 PCP: Pearson GrippeKim, James, MD  HPI/Recap of past 9324 hours: 32 year old female with past oral history of chronic anemia and IV drug use presented to the emergency room on 1/31 with complaints of fevers and chills and generalized weakness for the last 2 days. Patient noted to have erythema and petechiae on her left hand and lower extremities and found to have sepsis due to left hand cellulitis. 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 Patient not felt to be a candidate for valve replacement as per CT surgery due to her history of ongoing drug use. Also found to have right sternoclavicular septic arthritis. Patient was seen and evaluated by infectious disease who have started her on IV nafcillin to be completed for a total of 6 weeks of therapy, to end on 04/15/2017. PICC line placed. because patient is unable to go home with PICC line given her drug use and currently there are no available facilities will take this patient, she is currently remaining as inpatient getting IV antibiotics.    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 Patient continued on IV nafcillin she will need to continue this for 4/2.  Active Problems:   Sepsis affecting skin (HCC)   Pelvic pain   Cellulitis of left hand   Staphylococcus aureus bacteremia with sepsis (HCC)   Weakness of both lower extremities   Pain of right clavicle: This    IVDU (intravenous drug user): on suboxone.   Septic arthritis of right sternoclavicular joint (HCC)   PFO (patent foramen ovale)   Tricuspid valve vegetation  depression: On  Cymbalta and Zyprexa Chronic diastolic heart failure: Diastolic dysfunction noted on initial echocardiogram few days after admission. Likely this is more secondary to her tricuspid regurg. Nevertheless, she is getting daily IV antibiotics and we need to be careful about volume overload. We'll check a BNP for now and daily weights.  Code Status: full code    Family Communication: patient did not wish for me to talk to family    Disposition Plan: home once finished with antibiotics or if placement found    Consultants:  Infectious disease    Procedures:  Echocardiogram done   2/1: Grade 1 diastolic dysfunction, mild tricuspid regurg. TEE done 2/7: Vegetation noted on tricuspid valve PFO noted   antimicrobials: IV Ancef 2/-2/8 Zyvox 2/5-2/6, 2/17-/18 IV nafcillin 2/8-present IV vancomycin and Zosyn 1/31-2/1  DVT prophylaxis:  Lovenox    Objective: Vitals:   03/06/17 2213 03/07/17 0502 03/07/17 0509 03/07/17 1415  BP: 112/71  130/79 109/71  Pulse: 79  73 65  Resp: 17  17 18   Temp: 98.2 F (36.8 C)  98.5 F (36.9 C) 97.8 F (36.6 C)  TempSrc: Oral  Oral Oral  SpO2: 100%  99% 100%  Weight:  76.6 kg (168 lb 14 oz)    Height:        Intake/Output Summary (Last 24 hours) at 03/07/2017 1615 Last data filed at 03/07/2017 1416 Gross per 24 hour  Intake 1402 ml  Output -  Net 1402 ml   Filed Weights   03/05/17 2100 03/06/17 0426 03/07/17 0502  Weight: 75.4 kg (166 lb 3.6 oz) 74.8  kg (164 lb 14.5 oz) 76.6 kg (168 lb 14 oz)   Body mass index is 26.45 kg/m.  Exam:   General:  Alert and oriented 3, no acute distress   Cardiovascular: Regular rate and rhythm, S1-S2    Respiratory: Clear to auscultation bilaterally    Abdomen: soft, nontender, nontender, positive bowel sounds   Musculoskeletal: no clubbing cyanosis or edema    Skin: many tattoos noted  Psychiatry: Slightly flattened affect     Data Reviewed: CBC: No results for input(s): WBC, NEUTROABS, HGB,  HCT, MCV, PLT in the last 168 hours. Basic Metabolic Panel: No results for input(s): NA, K, CL, CO2, GLUCOSE, BUN, CREATININE, CALCIUM, MG, PHOS in the last 168 hours. GFR: Estimated Creatinine Clearance: 105.1 mL/min (by C-G formula based on SCr of 0.82 mg/dL). Liver Function Tests: No results for input(s): AST, ALT, ALKPHOS, BILITOT, PROT, ALBUMIN in the last 168 hours. No results for input(s): LIPASE, AMYLASE in the last 168 hours. No results for input(s): AMMONIA in the last 168 hours. Coagulation Profile: No results for input(s): INR, PROTIME in the last 168 hours. Cardiac Enzymes: No results for input(s): CKTOTAL, CKMB, CKMBINDEX, TROPONINI in the last 168 hours. BNP (last 3 results) No results for input(s): PROBNP in the last 8760 hours. HbA1C: No results for input(s): HGBA1C in the last 72 hours. CBG: No results for input(s): GLUCAP in the last 168 hours. Lipid Profile: No results for input(s): CHOL, HDL, LDLCALC, TRIG, CHOLHDL, LDLDIRECT in the last 72 hours. Thyroid Function Tests: No results for input(s): TSH, T4TOTAL, FREET4, T3FREE, THYROIDAB in the last 72 hours. Anemia Panel: No results for input(s): VITAMINB12, FOLATE, FERRITIN, TIBC, IRON, RETICCTPCT in the last 72 hours. Urine analysis:    Component Value Date/Time   COLORURINE AMBER (A) 02/13/2017 0436   APPEARANCEUR HAZY (A) 02/13/2017 0436   LABSPEC 1.014 02/13/2017 0436   PHURINE 5.0 02/13/2017 0436   GLUCOSEU NEGATIVE 02/13/2017 0436   HGBUR LARGE (A) 02/13/2017 0436   BILIRUBINUR NEGATIVE 02/13/2017 0436   KETONESUR NEGATIVE 02/13/2017 0436   PROTEINUR NEGATIVE 02/13/2017 0436   UROBILINOGEN 0.2 12/07/2012 1510   NITRITE POSITIVE (A) 02/13/2017 0436   LEUKOCYTESUR TRACE (A) 02/13/2017 0436   Sepsis Labs: @LABRCNTIP (procalcitonin:4,lacticidven:4)  )No results found for this or any previous visit (from the past 240 hour(s)).    Studies: No results found.  Scheduled Meds: .  buprenorphine-naloxone  2 tablet Sublingual Daily  . DULoxetine  60 mg Oral BID  . enoxaparin (LOVENOX) injection  40 mg Subcutaneous Q24H  . ferrous sulfate  325 mg Oral TID WC  . gadobenate dimeglumine  15 mL Intravenous Once  . multivitamin with minerals  1 tablet Oral Daily  . OLANZapine  5 mg Oral q morning - 10a  . polyethylene glycol  17 g Oral Daily  . senna-docusate  1 tablet Oral BID  . sodium chloride flush  10-40 mL Intracatheter Q12H    Continuous Infusions: . nafcillin IV Stopped (03/07/17 1330)     LOS: 22 days     Hollice Espy, MD [facility]  To reach me or the doctor on call, go to: [email or web address] Password [facility]  03/07/2017, 4:15 PM

## 2017-03-08 NOTE — Progress Notes (Signed)
PROGRESS NOTE  [name] ZOX:[id number] DOB: 07/20/1985 DOA: 02/13/2017 PCP: Pearson Grippe, MD  HPI/Recap of past 75 hours: 32 year old female with past oral history of chronic anemia and IV drug use presented to the emergency room on 1/31 with complaints of fevers and chills and generalized weakness for the last 2 days. Patient noted to have erythema and petechiae on her left hand and lower extremities and found to have sepsis due to left hand cellulitis. 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 Patient not felt to be a candidate for valve replacement as per CT surgery due to her history of ongoing drug use. Also found to have right sternoclavicular septic arthritis. Patient was seen and evaluated by infectious disease who have started her on IV nafcillin to be completed for a total of 6 weeks of therapy, to end on 04/15/2017. PICC line placed. because patient is unable to go home with PICC line given her drug use and currently there are no available facilities will take this patient, she is currently remaining as inpatient getting IV antibiotics.  03/08/2017: Patient seen.  No new complaints.  Patient's cardiac BNP is elevated in 200 range, but patient does not have any symptoms and signs of congestive heart failure.  Updated patient extensively.  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 Patient continued on IV nafcillin she will need to continue this for 4/2.  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 Likely this is more secondary to her tricuspid regurg. Nevertheless, she is getting daily IV antibiotics and we need to be careful about volume overload. We'll check a BNP for now and daily weights.  Code Status: full code    Family Communication: patient did not wish for me to talk to family    Disposition Plan: home once finished with antibiotics or if placement found    Consultants:  Infectious disease    Procedures:  Echocardiogram done   2/1: Grade 1 diastolic dysfunction, mild tricuspid regurg. TEE done 2/7: Vegetation noted on tricuspid valve PFO noted   antimicrobials: IV Ancef 2/-2/8 Zyvox 2/5-2/6, 2/17-/18 IV nafcillin 2/8-present IV vancomycin and Zosyn 1/31-2/1  DVT prophylaxis:  Lovenox    Objective: Vitals:   03/07/17 1415 03/07/17 2125 03/08/17 0500 03/08/17 0551  BP: 109/71 112/69  (!) 126/92  Pulse: 65 95  72  Resp: 18 20  18   Temp: 97.8 F (36.6 C) 98 F (36.7 C)  98.2 F (36.8 C)  TempSrc: Oral     SpO2: 100% 99%  97%  Weight:   75 kg (165 lb 5.5 oz)   Height:        Intake/Output Summary (Last 24 hours) at 03/08/2017 0954 Last data filed at 03/08/2017 0930 Gross per 24  hour  Intake 844 ml  Output -  Net 844 ml   Filed Weights   03/06/17 0426 03/07/17 0502 03/08/17 0500  Weight: 74.8 kg (164 lb 14.5 oz) 76.6 kg (168 lb 14 oz) 75 kg (165 lb 5.5 oz)   Body mass index is 25.9 kg/m.  Exam:   General:  Alert and oriented 3, no acute distress   Cardiovascular: Regular rate and rhythm, S1-S2    Respiratory: Clear to auscultation bilaterally    Abdomen: soft, nontender, nontender, positive bowel sounds    Musculoskeletal: no clubbing cyanosis or edema    Skin: many tattoos noted  Psychiatry: Slightly flattened affect     Data Reviewed: CBC: No results for input(s): WBC, NEUTROABS, HGB, HCT, MCV, PLT in the last 168 hours. Basic Metabolic Panel: No results for input(s): NA, K, CL, CO2, GLUCOSE, BUN, CREATININE, CALCIUM, MG, PHOS in the last 168 hours. GFR: Estimated Creatinine Clearance: 104.2 mL/min (by C-G formula based on SCr of 0.82 mg/dL). Liver Function Tests: No results for input(s): AST, ALT, ALKPHOS, BILITOT, PROT, ALBUMIN in the last 168 hours. No results for input(s): LIPASE, AMYLASE in the last 168 hours. No results for input(s): AMMONIA in the last 168 hours. Coagulation Profile: No results for input(s): INR, PROTIME in the last 168 hours. Cardiac Enzymes: No results for input(s): CKTOTAL, CKMB, CKMBINDEX, TROPONINI in the last 168 hours. BNP (last 3 results) No results for input(s): PROBNP in the last 8760 hours. HbA1C: No results for input(s): HGBA1C in the last 72 hours. CBG: No results for input(s): GLUCAP in the last 168 hours. Lipid Profile: No results for input(s): CHOL, HDL, LDLCALC, TRIG, CHOLHDL, LDLDIRECT in the last 72 hours. Thyroid Function Tests: No results for input(s): TSH, T4TOTAL, FREET4, T3FREE, THYROIDAB in the last 72 hours. Anemia Panel: No results for input(s): VITAMINB12, FOLATE, FERRITIN, TIBC, IRON, RETICCTPCT in the last 72 hours. Urine analysis:    Component Value Date/Time   COLORURINE AMBER (A) 02/13/2017 0436   APPEARANCEUR HAZY (A) 02/13/2017 0436   LABSPEC 1.014 02/13/2017 0436   PHURINE 5.0 02/13/2017 0436   GLUCOSEU NEGATIVE 02/13/2017 0436   HGBUR LARGE (A) 02/13/2017 0436   BILIRUBINUR NEGATIVE 02/13/2017 0436   KETONESUR NEGATIVE 02/13/2017 0436   PROTEINUR NEGATIVE 02/13/2017 0436   UROBILINOGEN 0.2 12/07/2012 1510   NITRITE POSITIVE (A) 02/13/2017 0436   LEUKOCYTESUR TRACE (A) 02/13/2017 0436   Sepsis  Labs: @LABRCNTIP (procalcitonin:4,lacticidven:4)  )No results found for this or any previous visit (from the past 240 hour(s)).    Studies: No results found.  Scheduled Meds: . buprenorphine-naloxone  2 tablet Sublingual Daily  . DULoxetine  60 mg Oral BID  . enoxaparin (LOVENOX) injection  40 mg Subcutaneous Q24H  . ferrous sulfate  325 mg Oral TID WC  . gadobenate dimeglumine  15 mL Intravenous Once  . multivitamin with minerals  1 tablet Oral Daily  . OLANZapine  5 mg Oral q morning - 10a  . polyethylene glycol  17 g Oral Daily  . senna-docusate  1 tablet Oral BID  . sodium chloride flush  10-40 mL Intracatheter Q12H    Continuous Infusions: . nafcillin IV 2 g (03/08/17 0831)     LOS: 23 days     Barnetta ChapelSylvester I Darran Gabay, MD [facility]  To reach me or the doctor on call, go to: [email or web address] Password [email or web address]  03/08/2017, 9:54 AM

## 2017-03-09 NOTE — Progress Notes (Signed)
PROGRESS NOTE  [name] ZOX:[id number] DOB: 06-04-1985 DOA: 02/13/2017 PCP: Pearson Grippe, MD  HPI/Recap of past 56 hours: 32 year old female with past oral history of chronic anemia and IV drug use presented to the emergency room on 1/31 with complaints of fevers and chills and generalized weakness for the last 2 days. Patient noted to have erythema and petechiae on her left hand and lower extremities and found to have sepsis due to left hand cellulitis. 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 Patient not felt to be a candidate for valve replacement as per CT surgery due to her history of ongoing drug use. Also found to have right sternoclavicular septic arthritis. Patient was seen and evaluated by infectious disease who have started her on IV nafcillin to be completed for a total of 6 weeks of therapy, to end on 04/15/2017. PICC line placed. because patient is unable to go home with PICC line given her drug use and currently there are no available facilities will take this patient, she is currently remaining as inpatient getting IV antibiotics.  03/09/2017: Patient seen.  No fever or chills.  No shortness of breath.  No dyspnea on exertion.  No leg edema.  Will complete course of antibiotics.   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 Patient continued on IV nafcillin she will need to continue this for 4/2.  Active Problems:   Sepsis affecting skin (HCC)   Pelvic pain   Cellulitis of left hand   Staphylococcus aureus bacteremia with sepsis (HCC)   Weakness of both lower extremities   Pain of right clavicle: This    IVDU (intravenous drug  user): on suboxone.   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 Likely this is more secondary to her tricuspid regurg. Nevertheless, she is getting daily IV antibiotics and we need to be careful about volume overload. We'll check a BNP for now and daily weights. 03/09/2017: The diastolic function is stable.  Code Status: full code    Family Communication: patient did not wish for me to talk to family    Disposition Plan: home once finished with antibiotics or if placement found    Consultants:  Infectious disease    Procedures:  Echocardiogram done   2/1: Grade 1 diastolic dysfunction, mild tricuspid regurg. TEE done 2/7: Vegetation noted on tricuspid valve PFO noted   antimicrobials: IV Ancef 2/-2/8 Zyvox 2/5-2/6, 2/17-/18 IV nafcillin 2/8-present IV vancomycin and Zosyn 1/31-2/1  DVT prophylaxis:  Lovenox    Objective: Vitals:   03/08/17 1335 03/08/17 2237 03/09/17 0557 03/09/17 1319  BP: 105/68 (!) 100/51 (!) 127/91 115/75  Pulse: 87 91 74 84  Resp: 20 18 18 16   Temp: (!) 97.3 F (36.3 C) 98.3 F (36.8 C) 98 F (36.7 C) 98 F (36.7 C)  TempSrc:  Oral Oral   SpO2: 100% 96% 98% 100%  Weight:   75 kg (165 lb 5.5 oz)   Height:        Intake/Output Summary (Last 24 hours) at 03/09/2017 1704 Last data  filed at 03/09/2017 1300 Gross per 24 hour  Intake 1140 ml  Output -  Net 1140 ml   Filed Weights   03/07/17 0502 03/08/17 0500 03/09/17 0557  Weight: 76.6 kg (168 lb 14 oz) 75 kg (165 lb 5.5 oz) 75 kg (165 lb 5.5 oz)   Body mass index is 25.9 kg/m.  Exam:   General:  Alert and oriented 3, no acute distress   Cardiovascular: Regular rate and rhythm, S1-S2    Respiratory: Clear to auscultation bilaterally    Abdomen: soft, nontender,  nontender, positive bowel sounds   Musculoskeletal: no clubbing cyanosis or edema    Skin: many tattoos noted  Psychiatry: Slightly flattened affect     Data Reviewed: CBC: No results for input(s): WBC, NEUTROABS, HGB, HCT, MCV, PLT in the last 168 hours. Basic Metabolic Panel: No results for input(s): NA, K, CL, CO2, GLUCOSE, BUN, CREATININE, CALCIUM, MG, PHOS in the last 168 hours. GFR: Estimated Creatinine Clearance: 104.2 mL/min (by C-G formula based on SCr of 0.82 mg/dL). Liver Function Tests: No results for input(s): AST, ALT, ALKPHOS, BILITOT, PROT, ALBUMIN in the last 168 hours. No results for input(s): LIPASE, AMYLASE in the last 168 hours. No results for input(s): AMMONIA in the last 168 hours. Coagulation Profile: No results for input(s): INR, PROTIME in the last 168 hours. Cardiac Enzymes: No results for input(s): CKTOTAL, CKMB, CKMBINDEX, TROPONINI in the last 168 hours. BNP (last 3 results) No results for input(s): PROBNP in the last 8760 hours. HbA1C: No results for input(s): HGBA1C in the last 72 hours. CBG: No results for input(s): GLUCAP in the last 168 hours. Lipid Profile: No results for input(s): CHOL, HDL, LDLCALC, TRIG, CHOLHDL, LDLDIRECT in the last 72 hours. Thyroid Function Tests: No results for input(s): TSH, T4TOTAL, FREET4, T3FREE, THYROIDAB in the last 72 hours. Anemia Panel: No results for input(s): VITAMINB12, FOLATE, FERRITIN, TIBC, IRON, RETICCTPCT in the last 72 hours. Urine analysis:    Component Value Date/Time   COLORURINE AMBER (A) 02/13/2017 0436   APPEARANCEUR HAZY (A) 02/13/2017 0436   LABSPEC 1.014 02/13/2017 0436   PHURINE 5.0 02/13/2017 0436   GLUCOSEU NEGATIVE 02/13/2017 0436   HGBUR LARGE (A) 02/13/2017 0436   BILIRUBINUR NEGATIVE 02/13/2017 0436   KETONESUR NEGATIVE 02/13/2017 0436   PROTEINUR NEGATIVE 02/13/2017 0436   UROBILINOGEN 0.2 12/07/2012 1510   NITRITE POSITIVE (A) 02/13/2017 0436   LEUKOCYTESUR TRACE (A)  02/13/2017 0436   Sepsis Labs: @LABRCNTIP (procalcitonin:4,lacticidven:4)  )No results found for this or any previous visit (from the past 240 hour(s)).    Studies: No results found.  Scheduled Meds: . buprenorphine-naloxone  2 tablet Sublingual Daily  . DULoxetine  60 mg Oral BID  . enoxaparin (LOVENOX) injection  40 mg Subcutaneous Q24H  . ferrous sulfate  325 mg Oral TID WC  . gadobenate dimeglumine  15 mL Intravenous Once  . multivitamin with minerals  1 tablet Oral Daily  . OLANZapine  5 mg Oral q morning - 10a  . polyethylene glycol  17 g Oral Daily  . senna-docusate  1 tablet Oral BID  . sodium chloride flush  10-40 mL Intracatheter Q12H    Continuous Infusions: . nafcillin IV 2 g (03/09/17 1622)     LOS: 24 days     Tammy ChapelSylvester I Yasmin Bronaugh, MD [facility]  To reach me or the doctor on call, go to: [email or web address] Password [email or web address]  03/09/2017, 5:04 PM

## 2017-03-10 NOTE — Progress Notes (Signed)
Nutrition Follow-up  DOCUMENTATION CODES:   Not applicable  INTERVENTION:  Monitor for needs   NUTRITION DIAGNOSIS:   Inadequate oral intake related to lethargy/confusion, poor appetite as evidenced by meal completion < 50%. -resolved  GOAL:   Patient will meet greater than or equal to 90% of their needs -met with food from outside  MONITOR:   PO intake, Supplement acceptance, Labs, Weight trends, Skin, I & O's  REASON FOR ASSESSMENT:   Malnutrition Screening Tool    ASSESSMENT:   32 y.o. female w/ a hx of fibromyalgia, anemia, and IV drug abuse who presented with a few days history of fever, chills, generalized weakness muscle aches, and arthralgias.  In the emergency room the patient was noted to have fever, leukocytosis and left hand and bilateral lower extremity redness and petechiae.    [name] is eating well. Had french toast and grits this morning. Meal completion averaging 65% per chart, but she is also getting food brought to her still. No needs from RD perspective Continues on IV Antibiotics  Diet Order:  Diet Heart Room service appropriate? Yes; Fluid consistency: Thin  EDUCATION NEEDS:   Education needs have been addressed  Skin:  Skin Assessment: Reviewed RN Assessment  Last BM:  03/03/2017  Height:   Ht Readings from Last 1 Encounters:  02/17/17 '5\' 7"'  (1.702 m)    Weight:   Wt Readings from Last 1 Encounters:  03/09/17 165 lb 5.5 oz (75 kg)    Ideal Body Weight:  61.4 kg  BMI:  Body mass index is 25.9 kg/m.  Estimated Nutritional Needs:   Kcal:  1700-1900  Protein:  90-105 grams  Fluid:  1.7-1.9 L  Satira Anis. [name], MS, RD LDN Inpatient Clinical Dietitian Pager [phone]

## 2017-03-10 NOTE — Progress Notes (Signed)
PROGRESS NOTE  [name] ZOX:[id number] DOB: 07/12/1985 DOA: 02/13/2017 PCP: Pearson GrippeKim, James, MD  HPI/Recap of past 4724 hours: 32 year old female with past medical history significant for chronic anemia and IV drug use.  Patient presented to the emergency room on 02/13/2017 with complaints of fevers and chills and generalized weakness for 2 days prior to presentation. Patient was noted to have erythema and petechiae on her left hand and lower extremities and found to have MSSA bacteremia/sepsis. 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 Patient was not felt to be a candidate for valve replacement as per CT surgery due to her history of ongoing drug use.  Patient was also found to have right sternoclavicular septic arthritis. Patient was seen and evaluated by infectious disease.  Patient is currently on IV nafcillin, and patient with complete 6-8 weeks of IV antibiotics therapy (antibiotic therapy suspected to and on 042 2019).  PICC line has been placed.  Patient is not able to go home with PICC line given patient's illicit drug use habit.  No facility is willing to accept patient.  The plan is for the patient to remain in the hospital and complete the course of IV antibiotics.    03/10/2017: Patient seen.  No fever or chills.  No shortness of breath.  No dyspnea on exertion.  No leg edema.  Will complete course of antibiotics.   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 -Right sternoclavicular septic arthritis all secondary to IV drug use and septic Staphylococcus infection -Repeat blood cultures done 2/6 has not grown any organism up-to-date. -Patient with complete 6-8 weeks of IV Nafcillin (this will be completed on 04/15/2017)    Active Problems:   Sepsis affecting skin (HCC): Complete antibiotics as above.   Pelvic pain: This has resolved significantly.   Cellulitis of left hand: Complete antibiotics as above.   Staphylococcus aureus bacteremia with sepsis (HCC): Blood culture done on 02/19/2017 has not been on any admission.  Will complete course of antibiotics.   Weakness of both lower extremities:   Pain of right clavicle: Is likely related to septic arthritis of the right sternoclavicular joint.   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   Septic arthritis of right sternoclavicular joint [facility](HCC): Complete course of antibiotics.   PFO (patent foramen ovale)   Tricuspid valve vegetation   Depression: On Cymbalta and Zyprexa   Chronic diastolic heart failure: compensated.  Code Status: full code    Family Communication: patient did not wish for me to talk to family    Disposition Plan: home once finished with antibiotics or if placement found    Consultants:  Infectious disease  Cardiology  Procedures:  Echocardiogram done   2/1: Grade 1 diastolic dysfunction, mild tricuspid regurg. TEE done 2/7: Vegetation noted on tricuspid valve PFO noted   antimicrobials: IV Ancef 2/-2/8 Zyvox 2/5-2/6, 2/17-/18 IV nafcillin 2/8-present IV vancomycin and Zosyn 1/31-2/1  DVT prophylaxis:  Lovenox    Objective: Vitals:   03/09/17 0557 03/09/17 1319 03/09/17 2100 03/10/17 0458  BP: (!) 127/91 115/75 101/68 (!) 135/91  Pulse: 74 84 89 74  Resp: 18 16 17 18   Temp: 98 F (36.7 C) 98 F (36.7 C) 98.2 F (36.8 C) 98.1 F (36.7 C)  TempSrc: Oral  Oral   SpO2: 98% 100% 96%  99%  Weight: 75 kg (165 lb 5.5 oz)     Height:        Intake/Output Summary (Last 24 hours) at 03/10/2017 0838 Last data filed at 03/10/2017 0443 Gross per 24 hour  Intake 650 ml  Output -  Net 650 ml   Filed Weights   03/07/17 0502 03/08/17 0500 03/09/17 0557  Weight: 76.6 kg (168 lb 14 oz) 75 kg (165 lb 5.5 oz) 75 kg (165 lb  5.5 oz)   Body mass index is 25.9 kg/m.  Exam:   General:  Alert and oriented 3, no acute distress   Cardiovascular: Regular rate and rhythm, S1-S2    Respiratory: Clear to auscultation bilaterally    Abdomen: soft, nontender, nontender, positive bowel sounds   Musculoskeletal: no clubbing cyanosis or edema    Skin: many tattoos noted  Psychiatry: Slightly flattened affect     Data Reviewed: CBC: No results for input(s): WBC, NEUTROABS, HGB, HCT, MCV, PLT in the last 168 hours. Basic Metabolic Panel: No results for input(s): NA, K, CL, CO2, GLUCOSE, BUN, CREATININE, CALCIUM, MG, PHOS in the last 168 hours. GFR: Estimated Creatinine Clearance: 104.2 mL/min (by C-G formula based on SCr of 0.82 mg/dL). Liver Function Tests: No results for input(s): AST, ALT, ALKPHOS, BILITOT, PROT, ALBUMIN in the last 168 hours. No results for input(s): LIPASE, AMYLASE in the last 168 hours. No results for input(s): AMMONIA in the last 168 hours. Coagulation Profile: No results for input(s): INR, PROTIME in the last 168 hours. Cardiac Enzymes: No results for input(s): CKTOTAL, CKMB, CKMBINDEX, TROPONINI in the last 168 hours. BNP (last 3 results) No results for input(s): PROBNP in the last 8760 hours. HbA1C: No results for input(s): HGBA1C in the last 72 hours. CBG: No results for input(s): GLUCAP in the last 168 hours. Lipid Profile: No results for input(s): CHOL, HDL, LDLCALC, TRIG, CHOLHDL, LDLDIRECT in the last 72 hours. Thyroid Function Tests: No results for input(s): TSH, T4TOTAL, FREET4, T3FREE, THYROIDAB in the last 72 hours. Anemia Panel: No results for input(s): VITAMINB12, FOLATE, FERRITIN, TIBC, IRON, RETICCTPCT in the last 72 hours. Urine analysis:    Component Value Date/Time   COLORURINE AMBER (A) 02/13/2017 0436   APPEARANCEUR HAZY (A) 02/13/2017 0436   LABSPEC 1.014 02/13/2017 0436   PHURINE 5.0 02/13/2017 0436   GLUCOSEU NEGATIVE 02/13/2017 0436   HGBUR LARGE  (A) 02/13/2017 0436   BILIRUBINUR NEGATIVE 02/13/2017 0436   KETONESUR NEGATIVE 02/13/2017 0436   PROTEINUR NEGATIVE 02/13/2017 0436   UROBILINOGEN 0.2 12/07/2012 1510   NITRITE POSITIVE (A) 02/13/2017 0436   LEUKOCYTESUR TRACE (A) 02/13/2017 0436   Sepsis Labs: @LABRCNTIP (procalcitonin:4,lacticidven:4)  )No results found for this or any previous visit (from the past 240 hour(s)).    Studies: No results found.  Scheduled Meds: . buprenorphine-naloxone  2 tablet Sublingual Daily  . DULoxetine  60 mg Oral BID  . enoxaparin (LOVENOX) injection  40 mg Subcutaneous Q24H  . ferrous sulfate  325 mg Oral TID WC  . gadobenate dimeglumine  15 mL Intravenous Once  . multivitamin with minerals  1 tablet Oral Daily  . OLANZapine  5 mg Oral q morning - 10a  . polyethylene glycol  17 g Oral Daily  . senna-docusate  1 tablet Oral BID  . sodium chloride flush  10-40 mL Intracatheter Q12H    Continuous Infusions: . nafcillin IV 2 g (03/10/17 0831)     LOS: 25 days     Barnetta Chapel, MD [facility]  Pager #[phone] from 7 AM to 7 PM. Please check [email or web address] from 7 PM to 7 AM Password [facility]  03/10/2017, 8:38 AM

## 2017-03-10 NOTE — Progress Notes (Signed)
Patient requested letter for court appearance. CSW provided letter to patient.  Osborne Cascoadia Cherrill Scrima LCSW [phone]

## 2017-03-10 NOTE — Progress Notes (Signed)
Physical Therapy Treatment Patient Details Name: Tammy ArabCasey M [name] MRN: [id number] DOB: 06/04/1985 Today's Date: 03/10/2017    History of Present Illness Pt is a 32 y.o. female, with past medical history significant for fibromyalgia, anemia and history of IV drug abuse. She presented to the ED with a few days history of fever, chills, and generalized weakness muscle aches in addition to arthralgias.  She was admitted with diagnosis of sepsis.  Pt being treated for endocarditis.     PT Comments    Pt continues to make progress towards her goals. Pt has improved her walking endurance however continues to ambulated with decreased cadence and step length. PT will continue to see pt 1x week while she is in the hospital to progress endurance and walking speed.     Follow Up Recommendations  No PT follow up     Equipment Recommendations  None recommended by PT    Recommendations for Other Services       Precautions / Restrictions Precautions Precautions: Fall Restrictions Weight Bearing Restrictions: No    Mobility  Bed Mobility               General bed mobility comments: pt in recliner chair  Transfers Overall transfer level: Needs assistance   Transfers: Sit to/from Stand Sit to Stand: Supervision         General transfer comment: supervision for management of IV pole  Ambulation/Gait Ambulation/Gait assistance: Supervision Ambulation Distance (Feet): 1500 Feet Assistive device: None Gait Pattern/deviations: Step-to pattern;Decreased stride length Gait velocity: decreased(able to increase with VCs) Gait velocity interpretation: Below normal speed for age/gender General Gait Details: pt steady without use of AD, no LOB or assist noted. supervision for management of IV pole      Balance Overall balance assessment: Independent Sitting-balance support: No upper extremity supported;Feet supported Sitting balance-Leahy Scale: Good     Standing balance support:  During functional activity;No upper extremity supported Standing balance-Leahy Scale: Good                              Cognition Arousal/Alertness: Awake/alert Behavior During Therapy: WFL for tasks assessed/performed Overall Cognitive Status: Within Functional Limits for tasks assessed                                           General Comments General comments (skin integrity, edema, etc.): Pt grandparents in room on entry      Pertinent Vitals/Pain Pain Assessment: No/denies pain           PT Goals (current goals can now be found in the care plan section) Acute Rehab PT Goals Patient Stated Goal: get better PT Goal Formulation: With patient Time For Goal Achievement: 03/17/17 Potential to Achieve Goals: Good Progress towards PT goals: Progressing toward goals    Frequency    Min 1X/week      PT Plan Frequency needs to be updated;Discharge plan needs to be updated    Co-evaluation              AM-PAC PT "6 Clicks" Daily Activity  Outcome Measure  Difficulty turning over in bed (including adjusting bedclothes, sheets and blankets)?: None Difficulty moving from lying on back to sitting on the side of the bed? : None Difficulty sitting down on and standing up from a chair with arms (e.g., wheelchair, bedside  commode, etc,.)?: None Help needed moving to and from a bed to chair (including a wheelchair)?: None Help needed walking in hospital room?: None Help needed climbing 3-5 steps with a railing? : A Little 6 Click Score: 23    End of Session Equipment Utilized During Treatment: Gait belt Activity Tolerance: Patient tolerated treatment well Patient left: in chair;with call bell/phone within reach Nurse Communication: Mobility status PT Visit Diagnosis: Muscle weakness (generalized) (M62.81);Difficulty in walking, not elsewhere classified (R26.2)     Time: 4098-1191 PT Time Calculation (min) (ACUTE ONLY): 18  min  Charges:  $Gait Training: 8-22 mins                    G Codes:       Johnye Kist B. Beverely Risen PT, DPT Acute Rehabilitation  [phone] Pager [phone]     [name] 03/10/2017, 5:11 PM

## 2017-03-11 DIAGNOSIS — I5032 Chronic diastolic (congestive) heart failure: Secondary | ICD-10-CM

## 2017-03-11 MED ORDER — FUROSEMIDE 10 MG/ML IJ SOLN
40.0000 mg | Freq: Once | INTRAMUSCULAR | Status: AC
  Administered 2017-03-11: 40 mg via INTRAVENOUS
  Filled 2017-03-11: qty 4

## 2017-03-11 NOTE — Progress Notes (Signed)
Patient has increased edema in bilateral lower extremities 2+edema.

## 2017-03-11 NOTE — Progress Notes (Addendum)
PROGRESS NOTE  [name] ZOX:[id number] DOB: [birth year] DOA: 02/13/2017 PCP: Pearson Grippe, MD  HPI/Recap of past 21 hours: 32 year old female with past medical history significant for chronic anemia and IV drug use.  Patient presented to the emergency room on 02/13/2017 with complaints of fevers and chills and generalized weakness for 2 days prior to presentation. Patient was noted to have erythema and petechiae on her left hand and lower extremities and found to have MSSA bacteremia/sepsis. 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 Patient was not felt to be a candidate for valve replacement as per CT surgery due to her history of ongoing drug use.  Patient was also found to have right sternoclavicular septic arthritis. Patient was seen and evaluated by infectious disease.  Patient is currently on IV nafcillin, and patient with complete 6-8 weeks of IV antibiotics therapy (antibiotic therapy suspected to and on 042 2019).  PICC line has been placed.  Patient is not able to go home with PICC line given patient's illicit drug use habit.  No facility is willing to accept patient.  The plan is for the patient to remain in the hospital and complete the course of IV antibiotics.    03/11/2017:  Remains afebrile and hemodynamically stable.  No shortness of breath.  No dyspnea on exertion.     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 -Right sternoclavicular septic arthritis all secondary to IV drug use and septic Staphylococcus infection -Repeat blood cultures done 2/6 has not grown any organism up-to-date. -Patient will complete 6-8 weeks of IV Nafcillin (this will be completed on 04/15/2017) , will remain in the hospital until  then Continues to make progress with physical therapy  Anemia of chronic disease Hemoglobin has been stable between 7-8 Will recheck  Thrombocytosis likely reactive      Sepsis affecting skin (HCC): Complete antibiotics as above.   Pelvic pain: This has resolved significantly.   Cellulitis of left hand: Complete antibiotics as above.   Staphylococcus aureus bacteremia with sepsis (HCC): Blood culture done on 02/19/2017 has not been on any admission.  Will complete course of antibiotics.   Weakness of both lower extremities:   Pain of right clavicle: Is likely related to septic arthritis of the right sternoclavicular joint.   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   Septic arthritis of right sternoclavicular joint [facility]): Complete course of antibiotics.   PFO (patent foramen ovale)   Tricuspid valve vegetation   Depression: On Cymbalta and Zyprexa   Chronic diastolic heart failure: compensated. BNP slightly elevated at 222  B/l LE edema due to severe protein calorie malnutrition  Code Status: full code    Family Communication: patient did not wish for me to talk to family    Disposition Plan: home once finished with antibiotics on 4/2   Consultants:  Infectious disease  Cardiology  Procedures:  Echocardiogram done   2/1: Grade 1 diastolic dysfunction, mild tricuspid regurg. TEE done 2/7: Vegetation noted on tricuspid valve PFO noted   antimicrobials: IV Ancef 2/-2/8 Zyvox 2/5-2/6, 2/17-/18 IV nafcillin 2/8-present IV vancomycin and Zosyn 1/31-2/1  DVT prophylaxis:  Lovenox    Objective: Vitals:   03/10/17 1539 03/10/17 2055 03/11/17 0423 03/11/17 1331  BP: (!) 133/91 120/79 (!) 133/92 119/83  Pulse: 79 82 79 93  Resp: 16 16 18 18   Temp: 97.9 F (36.6 C) 98.2 F (36.8 C) 97.9 F (36.6 C) 97.7 F (36.5 C)  TempSrc: Oral Oral Oral Oral  SpO2: 99% 100% 98% 100%  Weight:      Height:        Intake/Output Summary (Last 24 hours) at 03/11/2017 1515 Last  data filed at 03/10/2017 1859 Gross per 24 hour  Intake 520 ml  Output -  Net 520 ml   Filed Weights   03/07/17 0502 03/08/17 0500 03/09/17 0557  Weight: 76.6 kg (168 lb 14 oz) 75 kg (165 lb 5.5 oz) 75 kg (165 lb 5.5 oz)   Body mass index is 25.9 kg/m.  Exam:   General:  Alert and oriented 3, no acute distress   Cardiovascular: Regular rate and rhythm, S1-S2    Respiratory: Clear to auscultation bilaterally    Abdomen: soft, nontender, nontender, positive bowel sounds   Musculoskeletal: no clubbing cyanosis or edema    Skin: many tattoos noted  Psychiatry: Slightly flattened affect     Data Reviewed: CBC: No results for input(s): WBC, NEUTROABS, HGB, HCT, MCV, PLT in the last 168 hours. Basic Metabolic Panel: No results for input(s): NA, K, CL, CO2, GLUCOSE, BUN, CREATININE, CALCIUM, MG, PHOS in the last 168 hours. GFR: Estimated Creatinine Clearance: 104.2 mL/min (by C-G formula based on SCr of 0.82 mg/dL). Liver Function Tests: No results for input(s): AST, ALT, ALKPHOS, BILITOT, PROT, ALBUMIN in the last 168 hours. No results for input(s): LIPASE, AMYLASE in the last 168 hours. No results for input(s): AMMONIA in the last 168 hours. Coagulation Profile: No results for input(s): INR, PROTIME in the last 168 hours. Cardiac Enzymes: No results for input(s): CKTOTAL, CKMB, CKMBINDEX, TROPONINI in the last 168 hours. BNP (last 3 results) No results for input(s): PROBNP in the last 8760 hours. HbA1C: No results for input(s): HGBA1C in the last 72 hours. CBG: No results for input(s): GLUCAP in the last 168 hours. Lipid Profile: No results for input(s): CHOL, HDL, LDLCALC, TRIG, CHOLHDL, LDLDIRECT in the last 72 hours. Thyroid Function Tests: No results for input(s): TSH, T4TOTAL, FREET4, T3FREE, THYROIDAB in the last 72 hours. Anemia Panel: No results for input(s): VITAMINB12, FOLATE, FERRITIN, TIBC, IRON, RETICCTPCT in the last 72 hours. Urine analysis:     Component Value Date/Time   COLORURINE AMBER (A) 02/13/2017 0436   APPEARANCEUR HAZY (A) 02/13/2017 0436   LABSPEC 1.014 02/13/2017 0436   PHURINE 5.0 02/13/2017 0436   GLUCOSEU NEGATIVE 02/13/2017 0436   HGBUR LARGE (A) 02/13/2017 0436   BILIRUBINUR NEGATIVE 02/13/2017 0436   KETONESUR NEGATIVE 02/13/2017 0436   PROTEINUR NEGATIVE 02/13/2017 0436   UROBILINOGEN 0.2 12/07/2012 1510   NITRITE POSITIVE (A) 02/13/2017 0436   LEUKOCYTESUR TRACE (A) 02/13/2017 0436   Sepsis Labs: @LABRCNTIP (procalcitonin:4,lacticidven:4)  )No results found for this or any previous visit (from the past 240 hour(s)).    Studies: No results found.  Scheduled Meds: . buprenorphine-naloxone  2 tablet Sublingual Daily  . DULoxetine  60 mg Oral BID  . enoxaparin (LOVENOX) injection  40 mg Subcutaneous Q24H  . ferrous sulfate  325 mg Oral TID WC  . gadobenate dimeglumine  15 mL Intravenous Once  . multivitamin with minerals  1 tablet Oral Daily  . OLANZapine  5 mg Oral q morning - 10a  . polyethylene glycol  17 g Oral Daily  . senna-docusate  1 tablet Oral BID  . sodium chloride flush  10-40 mL Intracatheter Q12H  Continuous Infusions: . nafcillin IV 2 g (03/11/17 1312)     LOS: 26 days     Richarda Overlie, MD [facility]   Please check [email or web address] from 7 PM to 7 AM Password [facility]  03/11/2017, 3:15 PM

## 2017-03-12 LAB — MAGNESIUM: MAGNESIUM: 1.8 mg/dL (ref 1.7–2.4)

## 2017-03-12 LAB — BASIC METABOLIC PANEL
Anion gap: 10 (ref 5–15)
BUN: 9 mg/dL (ref 6–20)
CALCIUM: 8.5 mg/dL — AB (ref 8.9–10.3)
CO2: 25 mmol/L (ref 22–32)
CREATININE: 0.81 mg/dL (ref 0.44–1.00)
Chloride: 104 mmol/L (ref 101–111)
GLUCOSE: 98 mg/dL (ref 65–99)
Potassium: 3.4 mmol/L — ABNORMAL LOW (ref 3.5–5.1)
Sodium: 139 mmol/L (ref 135–145)

## 2017-03-12 LAB — CBC
HEMATOCRIT: 25 % — AB (ref 36.0–46.0)
Hemoglobin: 8.2 g/dL — ABNORMAL LOW (ref 12.0–15.0)
MCH: 29.7 pg (ref 26.0–34.0)
MCHC: 32.8 g/dL (ref 30.0–36.0)
MCV: 90.6 fL (ref 78.0–100.0)
PLATELETS: 271 10*3/uL (ref 150–400)
RBC: 2.76 MIL/uL — ABNORMAL LOW (ref 3.87–5.11)
RDW: 18.5 % — AB (ref 11.5–15.5)
WBC: 4.4 10*3/uL (ref 4.0–10.5)

## 2017-03-12 LAB — PREALBUMIN: PREALBUMIN: 23.4 mg/dL (ref 18–38)

## 2017-03-12 LAB — ALBUMIN: ALBUMIN: 2.3 g/dL — AB (ref 3.5–5.0)

## 2017-03-12 MED ORDER — POTASSIUM CHLORIDE CRYS ER 20 MEQ PO TBCR
20.0000 meq | EXTENDED_RELEASE_TABLET | Freq: Two times a day (BID) | ORAL | Status: AC
  Administered 2017-03-12 (×2): 20 meq via ORAL
  Filled 2017-03-12 (×2): qty 1

## 2017-03-12 MED ORDER — MAGIC MOUTHWASH
5.0000 mL | Freq: Three times a day (TID) | ORAL | Status: DC | PRN
  Administered 2017-03-12 – 2017-04-13 (×6): 5 mL via ORAL
  Filled 2017-03-12 (×6): qty 5

## 2017-03-12 NOTE — Progress Notes (Signed)
PROGRESS NOTE  [name] Noah JYN:[id number] DOB: 04/27/1985 DOA: 02/13/2017 PCP: Pearson GrippeKim, James, MD  HPI/Recap of past 5624 hours: 32 year old female with past medical history significant for chronic anemia and IV drug use.  Patient presented to the emergency room on 02/13/2017 with complaints of fevers and chills and generalized weakness for 2 days prior to presentation. Patient was noted to have erythema and petechiae on her left hand and lower extremities and found to have MSSA bacteremia/sepsis. 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 Patient was not felt to be a candidate for valve replacement as per CT surgery due to her history of ongoing drug use.  Patient was also found to have right sternoclavicular septic arthritis. Patient was seen and evaluated by infectious disease.  Patient is currently on IV nafcillin, and patient with complete 6-8 weeks of IV antibiotics therapy (antibiotic therapy suspected to and on 042 2019).  PICC line has been placed.  Patient is not able to go home with PICC line given patient's illicit drug use habit.  No facility is willing to accept patient.  The plan is for the patient to remain in the hospital and complete the course of IV antibiotics.    03/12/2017:  Swelling in legs better after lasix .  No dyspnea on exertion.     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 -Right sternoclavicular septic arthritis all secondary to IV drug use and septic Staphylococcus infection -Repeat blood cultures done 2/6 has not grown any organism up-to-date. -Patient will complete 6-8 weeks of IV Nafcillin (this will be completed on 04/15/2017) , will remain in the hospital until then Continues to make progress with  physical therapy ID had concern regarding  risk of embolization across the PFO to CNS or kidneys in the left sided circulation that could occur even if vegetation is sterile. Dr. Tyrone SageGerhardt does not recommend TVR in context of her ongoing drug use. Family is unhappy with this but this is a very difficult clinical situation in which Dr. Tyrone SageGerhardt has to weigh risks/benefits.  Dr Zenaida NieceVan dam  recommended  repeat TTE and likely repeat TEE to assess vegetation.  Within the NEXT 4 WEEKS which would be by 3/15 repeat a CT of the clavicle by 3/15   Anemia of chronic disease Hemoglobin has been stable between 7-8 Repeat stable   Thrombocytosis likely reactive     Sepsis affecting skin (HCC): Complete antibiotics as above.   Pelvic pain: This has resolved significantly.   Cellulitis of left hand: Complete antibiotics as above.   Staphylococcus aureus bacteremia with sepsis (HCC): Blood culture done on 02/19/2017 has not been on any admission.  Will complete course of antibiotics.   Weakness of both lower extremities:   Pain of right clavicle: Is likely related to septic arthritis of the right sternoclavicular joint.   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   Septic arthritis of right sternoclavicular joint [facility](HCC): Complete course of antibiotics.   PFO (patent foramen ovale)   Tricuspid valve vegetation   Depression: On Cymbalta and Zyprexa   Chronic diastolic heart failure: compensated. BNP slightly elevated at 222  B/l LE edema due to severe protein calorie malnutrition albumin 1.6   Code Status: full code    Family Communication: patient did not wish for me to talk to  family    Disposition Plan: home once finished with antibiotics on 4/2   Consultants:  Infectious disease  Cardiology  Procedures:  Echocardiogram done   2/1: Grade 1 diastolic dysfunction, mild tricuspid regurg. TEE done 2/7: Vegetation noted on tricuspid valve PFO noted   antimicrobials: IV Ancef 2/-2/8 Zyvox  2/5-2/6, 2/17-/18 IV nafcillin 2/8-present IV vancomycin and Zosyn 1/31-2/1  DVT prophylaxis:  Lovenox    Objective: Vitals:   03/11/17 1645 03/11/17 2323 03/12/17 0441 03/12/17 0446  BP:  115/68 117/83   Pulse:  88 76   Resp:  17 15   Temp:  98.2 F (36.8 C) 98.2 F (36.8 C)   TempSrc:  Oral Oral   SpO2:  97% 96%   Weight: 78.5 kg (173 lb 1 oz)   77.4 kg (170 lb 9.6 oz)  Height: 5\' 7"  (1.702 [name])       Intake/Output Summary (Last 24 hours) at 03/12/2017 1248 Last data filed at 03/12/2017 0520 Gross per 24 hour  Intake 300 ml  Output -  Net 300 ml   Filed Weights   03/09/17 0557 03/11/17 1645 03/12/17 0446  Weight: 75 kg (165 lb 5.5 oz) 78.5 kg (173 lb 1 oz) 77.4 kg (170 lb 9.6 oz)   Body mass index is 26.72 kg/[name].  Exam:   General:  Alert and oriented 3, no acute distress   Cardiovascular: Regular rate and rhythm, S1-S2    Respiratory: Clear to auscultation bilaterally    Abdomen: soft, nontender, nontender, positive bowel sounds   Musculoskeletal: no clubbing cyanosis or edema    Skin: many tattoos noted  Psychiatry: Slightly flattened affect     Data Reviewed: CBC: Recent Labs  Lab 03/12/17 0330  WBC 4.4  HGB 8.2*  HCT 25.0*  MCV 90.6  PLT 271   Basic Metabolic Panel: Recent Labs  Lab 03/12/17 0330  NA 139  K 3.4*  CL 104  CO2 25  GLUCOSE 98  BUN 9  CREATININE 0.81  CALCIUM 8.5*   GFR: Estimated Creatinine Clearance: 106.9 mL/min (by C-G formula based on SCr of 0.81 mg/dL). Liver Function Tests: No results for input(s): AST, ALT, ALKPHOS, BILITOT, PROT, ALBUMIN in the last 168 hours. No results for input(s): LIPASE, AMYLASE in the last 168 hours. No results for input(s): AMMONIA in the last 168 hours. Coagulation Profile: No results for input(s): INR, PROTIME in the last 168 hours. Cardiac Enzymes: No results for input(s): CKTOTAL, CKMB, CKMBINDEX, TROPONINI in the last 168 hours. BNP (last 3 results) No results for input(s):  PROBNP in the last 8760 hours. HbA1C: No results for input(s): HGBA1C in the last 72 hours. CBG: No results for input(s): GLUCAP in the last 168 hours. Lipid Profile: No results for input(s): CHOL, HDL, LDLCALC, TRIG, CHOLHDL, LDLDIRECT in the last 72 hours. Thyroid Function Tests: No results for input(s): TSH, T4TOTAL, FREET4, T3FREE, THYROIDAB in the last 72 hours. Anemia Panel: No results for input(s): VITAMINB12, FOLATE, FERRITIN, TIBC, IRON, RETICCTPCT in the last 72 hours. Urine analysis:    Component Value Date/Time   COLORURINE AMBER (A) 02/13/2017 0436   APPEARANCEUR HAZY (A) 02/13/2017 0436   LABSPEC 1.014 02/13/2017 0436   PHURINE 5.0 02/13/2017 0436   GLUCOSEU NEGATIVE 02/13/2017 0436   HGBUR LARGE (A) 02/13/2017 0436   BILIRUBINUR NEGATIVE 02/13/2017 0436   KETONESUR NEGATIVE 02/13/2017 0436   PROTEINUR NEGATIVE 02/13/2017 0436   UROBILINOGEN 0.2 12/07/2012 1510   NITRITE POSITIVE (A) 02/13/2017 0436   LEUKOCYTESUR TRACE (A)  02/13/2017 0436   Sepsis Labs: @LABRCNTIP (procalcitonin:4,lacticidven:4)  )No results found for this or any previous visit (from the past 240 hour(s)).    Studies: No results found.  Scheduled Meds: . buprenorphine-naloxone  2 tablet Sublingual Daily  . DULoxetine  60 mg Oral BID  . enoxaparin (LOVENOX) injection  40 mg Subcutaneous Q24H  . ferrous sulfate  325 mg Oral TID WC  . gadobenate dimeglumine  15 mL Intravenous Once  . multivitamin with minerals  1 tablet Oral Daily  . OLANZapine  5 mg Oral q morning - 10a  . polyethylene glycol  17 g Oral Daily  . senna-docusate  1 tablet Oral BID  . sodium chloride flush  10-40 mL Intracatheter Q12H    Continuous Infusions: . nafcillin IV Stopped (03/12/17 0820)     LOS: 27 days     Richarda Overlie, MD [facility]   Please check [email or web address] from 7 PM to 7 AM Password [facility]  03/12/2017, 12:48 PM

## 2017-03-13 MED ORDER — SODIUM CHLORIDE 0.9 % IV BOLUS (SEPSIS): 500.0000 mL | Freq: Once | INTRAVENOUS | Status: DC

## 2017-03-13 MED ORDER — FUROSEMIDE 40 MG PO TABS
40.0000 mg | ORAL_TABLET | Freq: Once | ORAL | Status: AC
  Administered 2017-03-13: 40 mg via ORAL
  Filled 2017-03-13: qty 1

## 2017-03-13 NOTE — Progress Notes (Signed)
PROGRESS NOTE  [name]:[id number] DOB: Jun 03, 1985 DOA: 02/13/2017 PCP: Pearson Grippe, MD  HPI/Recap of past 74 hours: 32 year old female with past medical history significant for chronic anemia and IV drug use.  Patient presented to the emergency room on 02/13/2017 with complaints of fevers and chills and generalized weakness for 2 days prior to presentation. Patient was noted to have erythema and petechiae on her left hand and lower extremities and found to have MSSA bacteremia/sepsis. 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 Patient was not felt to be a candidate for valve replacement as per CT surgery due to her history of ongoing drug use.  Patient was also found to have right sternoclavicular septic arthritis. Patient was seen and evaluated by infectious disease.  Patient is currently on IV nafcillin, and patient with complete 6-8 weeks of IV antibiotics therapy (antibiotic therapy suspected to and on 04 /2 / 19).  PICC line has been placed.  Patient is not able to go home with PICC line given patient's illicit drug use habit.  No facility is willing to accept patient.  The plan is for the patient to remain in the hospital and complete the course of IV antibiotics.    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 -Right sternoclavicular septic arthritis all secondary to IV drug use and septic Staphylococcus infection -Repeat blood cultures done 2/6 has not grown any organism up-to-date. -Patient will complete 6-8 weeks of IV Nafcillin (this will be completed on  04/15/2017) , will remain in the hospital until then Fairly independent with her ADLs ID had concern regarding  risk of embolization across the PFO to CNS or kidneys in the left sided circulation that could occur even if vegetation is sterile. Dr. Tyrone Sage does not recommend TVR in context of her ongoing drug use.  Dr Zenaida Niece dam  recommended  repeat TTE and likely repeat TEE to assess vegetation.  Within the NEXT 4 WEEKS which would be by 3/15 repeat a CT /MRI of the clavicle by 3/15. I have ordered MRI of the sternum for 3/15, please remember to order TEE for 3/15   Anemia of chronic disease Hemoglobin has been stable between 7-8 Repeat stable at 8.2  Thrombocytosis likely reactive     Sepsis affecting skin (HCC): Complete antibiotics as above.   Pelvic pain: This has resolved significantly.   Cellulitis of left hand: Complete antibiotics as above.       Weakness of both lower extremities:   Pain of right clavicle: Is likely related to septic arthritis of the right sternoclavicular joint.   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   Septic arthritis of right sternoclavicular joint [facility]): Complete course of antibiotics.   PFO (patent foramen ovale)   Tricuspid valve vegetation   Depression: On Cymbalta and Zyprexa   Chronic diastolic heart failure: compensated. BNP slightly elevated at 222  B/l LE edema due to severe protein calorie malnutrition albumin 1.6. Has received Lasix 40 mg 2 doses, some improvement   Code Status: full code    Family Communication: patient did not wish for me to talk to family    Disposition Plan: home once finished with antibiotics on  4/2   Consultants:  Infectious disease  Cardiology  Procedures:  Echocardiogram done   2/1: Grade 1 diastolic dysfunction, mild tricuspid regurg. TEE done 2/7: Vegetation noted on tricuspid valve PFO noted   antimicrobials: IV Ancef 2/-2/8 Zyvox 2/5-2/6, 2/17-/18 IV nafcillin 2/8-present IV vancomycin and Zosyn  1/31-2/1  DVT prophylaxis:  Lovenox    Objective: Vitals:   03/12/17 1342 03/12/17 2130 03/13/17 0500 03/13/17 0900  BP: 114/84 130/78 126/77 127/81  Pulse: 85 82 80 78  Resp: 16 18 18 19   Temp: (!) 97.4 F (36.3 C) 98.9 F (37.2 C) 99.5 F (37.5 C) 99 F (37.2 C)  TempSrc: Oral Oral Oral Oral  SpO2: 100% 100% 98% 97%  Weight:   77.2 kg (170 lb 3.2 oz)   Height:        Intake/Output Summary (Last 24 hours) at 03/13/2017 1002 Last data filed at 03/13/2017 0947 Gross per 24 hour  Intake 1539 ml  Output 1 ml  Net 1538 ml   Filed Weights   03/11/17 1645 03/12/17 0446 03/13/17 0500  Weight: 78.5 kg (173 lb 1 oz) 77.4 kg (170 lb 9.6 oz) 77.2 kg (170 lb 3.2 oz)   Body mass index is 26.66 kg/m.  Exam:   General:  Alert and oriented 3, no acute distress   Cardiovascular: Regular rate and rhythm, S1-S2    Respiratory: Clear to auscultation bilaterally    Abdomen: soft, nontender, nontender, positive bowel sounds   Musculoskeletal: no clubbing cyanosis or edema    Skin: many tattoos noted  Psychiatry: Slightly flattened affect     Data Reviewed: CBC: Recent Labs  Lab 03/12/17 0330  WBC 4.4  HGB 8.2*  HCT 25.0*  MCV 90.6  PLT 271   Basic Metabolic Panel: Recent Labs  Lab 03/12/17 0330 03/12/17 1511  NA 139  --   K 3.4*  --   CL 104  --   CO2 25  --   GLUCOSE 98  --   BUN 9  --   CREATININE 0.81  --   CALCIUM 8.5*  --   MG  --  1.8   GFR: Estimated Creatinine Clearance: 106.7 mL/min (by C-G formula based on SCr of 0.81 mg/dL). Liver Function Tests: Recent Labs  Lab 03/12/17 1511  ALBUMIN 2.3*   No results for input(s): LIPASE, AMYLASE in the last 168 hours. No results for input(s): AMMONIA in the last 168 hours. Coagulation Profile: No results for input(s): INR, PROTIME in the last 168 hours. Cardiac Enzymes: No results for input(s): CKTOTAL, CKMB, CKMBINDEX, TROPONINI in the last 168 hours. BNP (last 3 results) No results for  input(s): PROBNP in the last 8760 hours. HbA1C: No results for input(s): HGBA1C in the last 72 hours. CBG: No results for input(s): GLUCAP in the last 168 hours. Lipid Profile: No results for input(s): CHOL, HDL, LDLCALC, TRIG, CHOLHDL, LDLDIRECT in the last 72 hours. Thyroid Function Tests: No results for input(s): TSH, T4TOTAL, FREET4, T3FREE, THYROIDAB in the last 72 hours. Anemia Panel: No results for input(s): VITAMINB12, FOLATE, FERRITIN, TIBC, IRON, RETICCTPCT in the last 72 hours. Urine analysis:    Component Value Date/Time   COLORURINE AMBER (A) 02/13/2017 0436   APPEARANCEUR HAZY (A) 02/13/2017 0436   LABSPEC 1.014 02/13/2017 0436   PHURINE 5.0 02/13/2017 0436   GLUCOSEU NEGATIVE 02/13/2017 0436   HGBUR LARGE (A) 02/13/2017 0436   BILIRUBINUR NEGATIVE 02/13/2017 0436   KETONESUR NEGATIVE 02/13/2017 0436   PROTEINUR NEGATIVE 02/13/2017 0436  UROBILINOGEN 0.2 12/07/2012 1510   NITRITE POSITIVE (A) 02/13/2017 0436   LEUKOCYTESUR TRACE (A) 02/13/2017 0436   Sepsis Labs: @LABRCNTIP (procalcitonin:4,lacticidven:4)  )No results found for this or any previous visit (from the past 240 hour(s)).    Studies: No results found.  Scheduled Meds: . buprenorphine-naloxone  2 tablet Sublingual Daily  . DULoxetine  60 mg Oral BID  . enoxaparin (LOVENOX) injection  40 mg Subcutaneous Q24H  . ferrous sulfate  325 mg Oral TID WC  . gadobenate dimeglumine  15 mL Intravenous Once  . multivitamin with minerals  1 tablet Oral Daily  . OLANZapine  5 mg Oral q morning - 10a  . polyethylene glycol  17 g Oral Daily  . senna-docusate  1 tablet Oral BID  . sodium chloride flush  10-40 mL Intracatheter Q12H    Continuous Infusions: . nafcillin IV 2 g (03/13/17 0939)     LOS: 28 days     Richarda OverlieNayana Rhonda Vangieson, MD [facility]   Please check [email or web address] from 7 PM to 7 AM Password [email or web address]  03/13/2017, 10:02 AM

## 2017-03-13 NOTE — Progress Notes (Signed)
Patient given CHG bath wipes per central line policy.  Pateint is independent and requests she give herself the wipe down when she wakes up more.

## 2017-03-14 ENCOUNTER — Inpatient Hospital Stay (HOSPITAL_COMMUNITY): Payer: Medicaid Other

## 2017-03-14 MED ORDER — FUROSEMIDE 40 MG PO TABS
40.0000 mg | ORAL_TABLET | Freq: Every day | ORAL | Status: AC
  Administered 2017-03-14 – 2017-03-15 (×2): 40 mg via ORAL
  Filled 2017-03-14 (×2): qty 1

## 2017-03-14 NOTE — Progress Notes (Signed)
PROGRESS NOTE  [name] VWU:[id number] DOB: 1985/02/17 DOA: 02/13/2017 PCP: Pearson Grippe, MD   LOS: 29 days   Brief Narrative / Interim history: 32 year old female with past medical history significant for chronic anemia and IV drug use.  Patient presented to the emergency room on 02/13/2017 with complaints of fevers and chills and generalized weakness for 2 days prior to presentation. Patient was noted to have erythema and petechiae on her left hand and lower extremities and found to have MSSA bacteremia/sepsis. 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 Patient was not felt to be a candidate for valve replacement as per CT surgery due to her history of ongoing drug use.  Patient was also found to have right sternoclavicular septic arthritis. Patient was seen and evaluated by infectious disease.  Patient is currently on IV nafcillin, and patient with complete 6-8 weeks of IV antibiotics therapy (antibiotic therapy suspected to and on 04 /2 / 19).  PICC line has been placed.  Patient is not able to go home with PICC line given patient's illicit drug use habit.  No facility is willing to accept patient.  The plan is for the patient to remain in the hospital and complete the course of IV antibiotics.    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 -Right sternoclavicular septic arthritis all secondary to IV drug use and septic Staphylococcus infection -Repeat blood cultures done 2/6 has not grown any organism up-to-date. -Patient will complete 6-8 weeks of IV Nafcillin (this will be completed on 04/15/2017) , will remain in the hospital until then -ID had concern regarding  risk of embolization across the PFO to CNS or kidneys in the left sided circulation that could occur even if vegetation is sterile. Dr. Tyrone Sage does not recommend TVR in context of her ongoing drug use.  -Dr Zenaida Niece dam  recommended  repeat TTE and likely repeat TEE to assess vegetation.Within the NEXT 4 WEEKSwhich would be by 3/15 repeat a CT /MRI of the clavicle by 3/15. I have ordered MRI of the sternum for 3/15, please remember to order TEE for 3/15  Anemia of chronic disease -Hemoglobin has been stable between 7-8 -repeat Hb tomorrow   Thrombocytosis  -likely reactive, improved   Sepsis affecting skin (HCC)  -Complete antibiotics as above.  Pelvic pain -This has resolved significantly.  Cellulitis of left hand  -Complete antibiotics as above.     Weakness of both lower extremities  Pain of right clavicle / right shoulder -Is likely related to septic arthritis of the right sternoclavicular joint. -feels like pain is worse today, will obtain plain XR  IVDU (intravenous drug user) -on suboxone.  Septic arthritis of right sternoclavicular joint (HCC)  -Complete course of antibiotics.  PFO (patent foramen ovale)  Tricuspid valve vegetation  Depression -On Cymbalta and Zyprexa  Chronic diastolic heart failure  -compensated. BNP slightly  elevated at 222  B/l LE edema  -due to severe protein calorie malnutrition albumin 1.6. Has received Lasix 40 mg 2 doses, some improvement -repeat Lasix x 2 more doses today and tomorrow    DVT prophylaxis: Lovenox Code Status: Full code Family Communication: no family at  bedside Disposition Plan: home once finishes Abx 4/2   Consultants:   ID  Cardiology   Procedures:   2D echo Echocardiogram done   2/1: Grade 1 diastolic dysfunction, mild tricuspid regurg. TEE done 2/7: Vegetation noted on tricuspid valve PFO noted   Antimicrobials:  IV Ancef 2/-2/8 Zyvox 2/5-2/6, 2/17-/18 IV nafcillin 2/8-present IV vancomycin and Zosyn 1/31-2/1  Subjective: Complains of right shoulder pain, worse in the past 3 days. No fevers/chills  Objective: Vitals:   03/13/17 1329 03/13/17 2123 03/14/17 0500 03/14/17 0546  BP: 118/90 110/80  135/89  Pulse: 81 94  79  Resp: 18 18  16   Temp: 98.6 F (37 C) 98.4 F (36.9 C)  98.6 F (37 C)  TempSrc: Oral Oral  Oral  SpO2: 100% 98%  96%  Weight:   76.4 kg (168 lb 6.9 oz)   Height:        Intake/Output Summary (Last 24 hours) at 03/14/2017 0826 Last data filed at 03/14/2017 0820 Gross per 24 hour  Intake 914.64 ml  Output -  Net 914.64 ml   Filed Weights   03/12/17 0446 03/13/17 0500 03/14/17 0500  Weight: 77.4 kg (170 lb 9.6 oz) 77.2 kg (170 lb 3.2 oz) 76.4 kg (168 lb 6.9 oz)    Examination:  Constitutional: NAD Eyes: lids and conjunctivae normal ENMT: Mucous membranes are moist.  Neck: normal, supple Respiratory: clear to auscultation bilaterally, no wheezing, no crackles. Normal respiratory effort. No accessory muscle use.  Cardiovascular: Regular rate and rhythm, no murmurs / rubs / gallops. No LE edema.  Abdomen: no tenderness. Bowel sounds positive.  Musculoskeletal: no clubbing / cyanosis.  Skin: no rashes Neurologic: CN 2-12 grossly intact. Strength 5/5 in all 4.    Data Reviewed: I have independently reviewed following labs and imaging studies   CBC: Recent Labs  Lab 03/12/17 0330  WBC 4.4  HGB 8.2*  HCT 25.0*  MCV 90.6  PLT 271   Basic Metabolic Panel: Recent Labs  Lab 03/12/17 0330 03/12/17 1511  NA 139  --   K 3.4*  --   CL 104  --   CO2 25  --   GLUCOSE 98  --   BUN 9   --   CREATININE 0.81  --   CALCIUM 8.5*  --   MG  --  1.8   GFR: Estimated Creatinine Clearance: 106.3 mL/min (by C-G formula based on SCr of 0.81 mg/dL). Liver Function Tests: Recent Labs  Lab 03/12/17 1511  ALBUMIN 2.3*   No results for input(s): LIPASE, AMYLASE in the last 168 hours. No results for input(s): AMMONIA in the last 168 hours. Coagulation Profile: No results for input(s): INR, PROTIME in the last 168 hours. Cardiac Enzymes: No results for input(s): CKTOTAL, CKMB, CKMBINDEX, TROPONINI in the last 168 hours. BNP (last 3 results) No results for input(s): PROBNP in the last 8760 hours. HbA1C: No results for input(s): HGBA1C in the last 72 hours. CBG: No results for input(s): GLUCAP in the last 168 hours. Lipid Profile: No results for input(s): CHOL, HDL, LDLCALC, TRIG, CHOLHDL, LDLDIRECT in the last 72 hours. Thyroid Function Tests: No results for input(s): TSH, T4TOTAL, FREET4, T3FREE, THYROIDAB in the last  72 hours. Anemia Panel: No results for input(s): VITAMINB12, FOLATE, FERRITIN, TIBC, IRON, RETICCTPCT in the last 72 hours. Urine analysis:    Component Value Date/Time   COLORURINE AMBER (A) 02/13/2017 0436   APPEARANCEUR HAZY (A) 02/13/2017 0436   LABSPEC 1.014 02/13/2017 0436   PHURINE 5.0 02/13/2017 0436   GLUCOSEU NEGATIVE 02/13/2017 0436   HGBUR LARGE (A) 02/13/2017 0436   BILIRUBINUR NEGATIVE 02/13/2017 0436   KETONESUR NEGATIVE 02/13/2017 0436   PROTEINUR NEGATIVE 02/13/2017 0436   UROBILINOGEN 0.2 12/07/2012 1510   NITRITE POSITIVE (A) 02/13/2017 0436   LEUKOCYTESUR TRACE (A) 02/13/2017 0436   Sepsis Labs: Invalid input(s): PROCALCITONIN, LACTICIDVEN  No results found for this or any previous visit (from the past 240 hour(s)).    Radiology Studies: No results found.   Scheduled Meds: . buprenorphine-naloxone  2 tablet Sublingual Daily  . DULoxetine  60 mg Oral BID  . enoxaparin (LOVENOX) injection  40 mg Subcutaneous Q24H  .  ferrous sulfate  325 mg Oral TID WC  . furosemide  40 mg Oral Daily  . gadobenate dimeglumine  15 mL Intravenous Once  . multivitamin with minerals  1 tablet Oral Daily  . OLANZapine  5 mg Oral q morning - 10a  . polyethylene glycol  17 g Oral Daily  . senna-docusate  1 tablet Oral BID  . sodium chloride flush  10-40 mL Intracatheter Q12H   Continuous Infusions: . nafcillin IV Stopped (03/14/17 1610)    Pamella Pert, MD, PhD [facility] Pager [phone]  If 7PM-7AM, please contact night-coverage [email or web address] Password [email or web address] 03/14/2017, 8:26 AM

## 2017-03-15 LAB — BASIC METABOLIC PANEL
ANION GAP: 9 (ref 5–15)
BUN: 16 mg/dL (ref 6–20)
CALCIUM: 8.7 mg/dL — AB (ref 8.9–10.3)
CO2: 25 mmol/L (ref 22–32)
Chloride: 105 mmol/L (ref 101–111)
Creatinine, Ser: 0.81 mg/dL (ref 0.44–1.00)
GLUCOSE: 103 mg/dL — AB (ref 65–99)
POTASSIUM: 4 mmol/L (ref 3.5–5.1)
SODIUM: 139 mmol/L (ref 135–145)

## 2017-03-15 LAB — CBC
HCT: 27.1 % — ABNORMAL LOW (ref 36.0–46.0)
Hemoglobin: 8.7 g/dL — ABNORMAL LOW (ref 12.0–15.0)
MCH: 29.2 pg (ref 26.0–34.0)
MCHC: 32.1 g/dL (ref 30.0–36.0)
MCV: 90.9 fL (ref 78.0–100.0)
PLATELETS: 308 10*3/uL (ref 150–400)
RBC: 2.98 MIL/uL — AB (ref 3.87–5.11)
RDW: 18.9 % — AB (ref 11.5–15.5)
WBC: 3.8 10*3/uL — ABNORMAL LOW (ref 4.0–10.5)

## 2017-03-15 NOTE — Progress Notes (Signed)
[facility]                                                                              Patient Demographics  [name], is a 32 y.o. female, DOB - [birth year], WUJ:[id number]  Admit date - 02/13/2017   Admitting Physician Carron Curie, MD  Outpatient Primary MD for the patient is Pearson Grippe, MD  Outpatient specialists:   LOS - 30  days   Medical records reviewed and are as summarized below:    Chief Complaint  Patient presents with  . Weakness       Brief summary   32 year old female with past medical history significant for chronic anemia and IV drug use. Patient presented to the emergency room on 02/13/2017 with complaints of fevers and chills and generalized weakness for 2 days prior to presentation. Patient was noted to have erythema and petechiae on her left hand and lower extremities and found to have MSSA bacteremia/sepsis. 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 Patient was not felt to be a candidate for valve replacement as per CT surgery due to her history of ongoing drug use. Patient was also found to have right sternoclavicular septic arthritis. Patient was seen and evaluated by infectious disease. Patient is currently on IV nafcillin, and patient with complete 6-8 weeks of IV antibiotics therapy (antibiotic therapy suspected to and on 04 /2/19). PICC line has been placed. Patient is not able to go home with PICC line given patient's illicit drug use habit. No facility is willing to accept patient. The plan is for the patient to remain in the hospital and complete the course of IV antibiotics.    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  -Right sternoclavicular septic  arthritis all secondary to IV drug use and septic Staphylococcus infection -Repeat blood cultures 2/6 has been negative so far.  -Patient will complete 6-8 weeks of IV Nafcillin (this will be completed on 04/15/2017), will remain in the hospital until then -ID had concern regarding risk of embolization across the PFO to CNS or kidneys in the left sided circulation that could occur even if vegetation is sterile. Dr. Tyrone Sage does not recommend TVR in context of her ongoing drug use.  -Dr Algis Liming recommended repeat 2-D echo and likely repeat TEE to assess for vegetations in next 4 weeks, CT of the clavicle by 3/15.   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  Septic  arthritis of right sternoclavicular joint (HCC)  -Complete course of antibiotics.  PFO (patent foramen ovale)  Tricuspid valve vegetation  Depression -On Cymbalta and Zyprexa  Chronic diastolic heart failure  -compensated, received Lasix  - BNP slightly elevated at 222  B/l LE edema -due to severe protein calorie malnutrition albumin 1.6.  - Received Lasix 40 mg 2 doses on 3/1  Code Status: full CODE STATUS DVT Prophylaxis:  Lovenox  Family Communication: Discussed in detail with the patient, all imaging results, lab results explained to the patient   Disposition Plan: to stay inpatient till completes antibiotics by 4/2   Time Spent in minutes   35   minutes  Procedures:  2-D echo 2/1 showed grade 1 diastolic dysfunction, mild tricuspid regurgitation TEE 2/7 vegetation noted on tricuspid valve with large PFO  Consultants:    infectious disease Cardiology CT surgery  Antimicrobials:  IV Ancef 2/-2/8 Zyvox 2/5-2/6, 2/17-/18 IV nafcillin 2/8-present IV vancomycin and Zosyn 1/31-2/1      Medications  Scheduled Meds: . buprenorphine-naloxone  2 tablet Sublingual Daily  . DULoxetine  60 mg Oral BID  . enoxaparin (LOVENOX) injection  40 mg Subcutaneous Q24H  . ferrous sulfate  325 mg Oral TID WC  . gadobenate dimeglumine  15 mL Intravenous Once  . multivitamin with minerals  1 tablet Oral Daily  . OLANZapine  5 mg Oral q morning - 10a  . polyethylene glycol  17 g Oral Daily  . senna-docusate  1 tablet Oral BID  . sodium chloride flush  10-40 mL Intracatheter Q12H   Continuous Infusions: . nafcillin IV 2 g (03/15/17 0839)   PRN Meds:.acetaminophen, ALPRAZolam, cyclobenzaprine, magic mouthwash, ondansetron **OR** ondansetron (ZOFRAN) IV, sodium chloride flush, sodium chloride flush   Antibiotics   Anti-infectives (From admission, onward)   Start     Dose/Rate Route Frequency Ordered Stop   03/02/17 2200  linezolid (ZYVOX) tablet 600 mg  Status:  Discontinued     600 mg Oral Every 12 hours 03/02/17 2037 03/03/17 1203   02/24/17 1600  nafcillin 2 g in sodium chloride 0.9 % 100 mL IVPB     2 g 200 mL/hr over 30 Minutes Intravenous Every 4 hours 02/24/17 0833     02/21/17 2000  nafcillin 2 g in dextrose 5 % 100 mL IVPB  Status:  Discontinued     2 g 200 mL/hr over 30 Minutes Intravenous Every 4 hours 02/21/17 1741 02/24/17 0910   02/21/17 1730  nafcillin injection 2 g  Status:  Discontinued     2 g Intravenous Every 4 hours 02/21/17 1726 02/21/17 1740   02/19/17 1230  ceFAZolin (ANCEF) IVPB 2g/100 mL premix  Status:  Discontinued     2 g 200 mL/hr over 30 Minutes Intravenous Every 8 hours 02/19/17 1148 02/21/17 1726    02/18/17 1330  linezolid (ZYVOX) tablet 600 mg  Status:  Discontinued     600 mg Oral Every 12 hours 02/18/17 1250 02/19/17 1126   02/17/17 0800  sulfamethoxazole-trimethoprim (BACTRIM DS,SEPTRA DS) 800-160 MG per tablet 2 tablet  Status:  Discontinued     2 tablet Oral Every 12 hours 02/16/17 1453 02/18/17 1250   02/16/17 1600  sulfamethoxazole-trimethoprim (BACTRIM DS,SEPTRA DS) 800-160 MG per tablet 2 tablet     2 tablet Oral  Once 02/16/17 1509 02/16/17 1657   02/14/17 1400  ceFAZolin (ANCEF) IVPB 2g/100 mL premix  Status:  Discontinued     2 g 200 mL/hr over 30 Minutes Intravenous Every 8  hours 02/14/17 1013 02/16/17 1453   02/14/17 0200  vancomycin (VANCOCIN) IVPB 750 mg/150 ml premix  Status:  Discontinued     750 mg 150 mL/hr over 60 Minutes Intravenous Every 8 hours 02/13/17 1739 02/14/17 1017   02/14/17 0000  piperacillin-tazobactam (ZOSYN) IVPB 3.375 g  Status:  Discontinued     3.375 g 12.5 mL/hr over 240 Minutes Intravenous Every 8 hours 02/13/17 1739 02/14/17 1013   02/13/17 1715  piperacillin-tazobactam (ZOSYN) IVPB 3.375 g     3.375 g 100 mL/hr over 30 Minutes Intravenous  Once 02/13/17 1701 02/13/17 1824   02/13/17 1715  vancomycin (VANCOCIN) IVPB 1000 mg/200 mL premix  Status:  Discontinued     1,000 mg 200 mL/hr over 60 Minutes Intravenous  Once 02/13/17 1701 02/13/17 1705   02/13/17 1715  vancomycin (VANCOCIN) 1,500 mg in sodium chloride 0.9 % 500 mL IVPB     1,500 mg 250 mL/hr over 120 Minutes Intravenous  Once 02/13/17 1705 02/13/17 2300        Subjective:   [name] was seen and examined today.  Feeling better today, no fevers or chills. Patient denies dizziness, chest pain, abdominal pain, N/V/D/C, new weakness, numbess, tingling. No acute events overnight.    Objective:   Vitals:   03/14/17 0546 03/14/17 2141 03/15/17 0500 03/15/17 0502  BP: 135/89 109/74  117/76  Pulse: 79 88  76  Resp: 16 18  16   Temp: 98.6 F (37 C) 97.9 F (36.6 C)  98.3 F  (36.8 C)  TempSrc: Oral Oral  Oral  SpO2: 96% 100%  98%  Weight:   75.1 kg (165 lb 9.1 oz)   Height:        Intake/Output Summary (Last 24 hours) at 03/15/2017 0953 Last data filed at 03/15/2017 0847 Gross per 24 hour  Intake 1320 ml  Output -  Net 1320 ml     Wt Readings from Last 3 Encounters:  03/15/17 75.1 kg (165 lb 9.1 oz)  02/10/17 73.9 kg (163 lb)  06/13/16 73 kg (161 lb)     Exam  General: Alert and oriented x 3, NAD  Eyes:  HEENT:  Atraumatic, normocephalic  Cardiovascular: S1 S2 auscultated,  Regular rate and rhythm.  Respiratory: Clear to auscultation bilaterally, no wheezing, rales or rhonchi  Gastrointestinal: Soft, nontender, nondistended, + bowel sounds  Ext: no pedal edema bilaterally  Neuro: no new deficits   Musculoskeletal: No digital cyanosis, clubbing  Skin: No rashes  Psych: Normal affect and demeanor, alert and oriented x3    Data Reviewed:  I have personally reviewed following labs and imaging studies  Micro Results No results found for this or any previous visit (from the past 240 hour(s)).  Radiology Reports Dg Chest 2 View  Result Date: 02/13/2017 CLINICAL DATA:  Pain EXAM: CHEST  2 VIEW COMPARISON:  12/19/2014 FINDINGS: Bibasilar opacities, favor atelectasis. Heart is normal size. No effusions or acute bony abnormality. IMPRESSION: Bibasilar opacities, likely atelectasis. Electronically Signed   By: Charlett Nose M.D.   On: 02/13/2017 18:34   Dg Shoulder Right  Result Date: 03/14/2017 CLINICAL DATA:  Anterior and posterior RIGHT shoulder pain, septic RIGHT sternoclavicular joint since 02/13/2017, smoker EXAM: RIGHT SHOULDER - 2+ VIEW COMPARISON:  RIGHT shoulder MRI 02/27/2017 FINDINGS: Osseous mineralization normal. AC joint alignment normal. No glenohumeral fracture, dislocation, or bone destruction. Visualized ribs intact. RIGHT arm PICC line noted. IMPRESSION: No acute osseous abnormalities. Electronically Signed   By: Angelyn Punt.D.  On: 03/14/2017 10:23   Ct Chest W Contrast  Result Date: 02/20/2017 CLINICAL DATA:  Acute onset of generalized chest pain. Assess for sternoclavicular septic arthritis. EXAM: CT CHEST WITH CONTRAST TECHNIQUE: Multidetector CT imaging of the chest was performed during intravenous contrast administration. CONTRAST:  75 mL ISOVUE-300 IOPAMIDOL (ISOVUE-300) INJECTION 61% COMPARISON:  MRI of the thoracic spine performed 02/17/2017 FINDINGS: Cardiovascular: The heart is borderline normal in size. The thoracic aorta is grossly unremarkable. A retroesophageal right subclavian artery is incidentally noted. The great vessels are unremarkable in appearance. Mediastinum/Nodes: Enlarged mediastinal nodes are noted at the subcarinal and azygoesophageal regions, aortopulmonary window, right paratracheal region and periaortic region, measuring up to 1.4 cm in short axis. Mildly prominent bilateral hilar nodes measure up to the 1.2 cm in short axis. No pericardial effusion is identified. The visualized portions of the thyroid gland are unremarkable. No axillary lymphadenopathy is appreciated. Lungs/Pleura: There is patchy airspace opacification involving the lower lobes bilaterally. Scattered nodular opacities are seen throughout both lungs. A small cavitary nodule is suggested at the right lung apex. This may reflect diffuse infection, possibly atypical in nature. Underlying metastatic disease cannot be excluded. Trace bilateral pleural fluid is noted.  No pneumothorax is seen. Upper Abdomen: The visualized portions of the liver are unremarkable. The spleen is enlarged, measuring 14.6 cm in length. The patient is status post cholecystectomy, with clips noted at the gallbladder fossa. The visualized portions of the pancreas and adrenal glands are within normal limits. Musculoskeletal: There is asymmetric prominence of soft tissue density tracking about the right sternoclavicular joint, with mild underlying soft tissue  inflammation. Given clinical concern, this could reflect right sternoclavicular joint septic arthritis. IMPRESSION: 1. Patchy airspace opacification involving the lower lung lobes bilaterally. Scattered nodular opacities throughout both lungs. Small cavitary nodule suggested at the right lung apex. Findings are concerning for diffuse infection, possibly atypical in nature. Underlying metastatic disease cannot be excluded. Would correlate clinically, and perform follow-up CT of the chest 3-4 weeks after completion of treatment for pneumonia. 2. Trace bilateral pleural fluid noted. 3. Asymmetric prominence of soft tissue density tracking about the right sternoclavicular joint, with mild underlying soft tissue inflammation. Given clinical concern, this could reflect right-sided clavicular joint septic arthritis. 4. Diffusely prominent mediastinal and bilateral hilar nodes noted. This may reflect the underlying infection. 5. Retroesophageal right subclavian artery incidentally noted. Electronically Signed   By: Roanna Raider M.D.   On: 02/20/2017 01:40   Ct Angio Chest Pe W Or Wo Contrast  Result Date: 02/25/2017 CLINICAL DATA:  Acute onset of pleuritic chest pain and shortness of breath. Elevated D-dimer. EXAM: CT ANGIOGRAPHY CHEST WITH CONTRAST TECHNIQUE: Multidetector CT imaging of the chest was performed using the standard protocol during bolus administration of intravenous contrast. Multiplanar CT image reconstructions and MIPs were obtained to evaluate the vascular anatomy. CONTRAST:  ISOVUE-370 IOPAMIDOL (ISOVUE-370) INJECTION 76% COMPARISON:  Chest radiograph performed 02/23/2016, and CT of the chest performed 02/20/2017 FINDINGS: Cardiovascular:  There is no evidence of pulmonary embolus. The heart is borderline enlarged. The thoracic aorta is grossly unremarkable in appearance. The great vessels are within normal limits. Mediastinum/Nodes: A 1.5 cm azygoesophageal recess node is noted. Visualized  hilar nodes are borderline normal in size. No pericardial effusion is identified. Incidental note is made of a retroesophageal right subclavian artery. The visualized portions of the thyroid gland are unremarkable. No axillary lymphadenopathy is appreciated. Lungs/Pleura: Patchy bilateral airspace opacities raise concern for pneumonia, most prominent at the lower lobes  bilaterally. Scattered peripheral nodular opacities are noted throughout both lungs. This is slightly improved from the prior study. There is a 2.9 cm cavitary focus at the right lung base, concerning for atypical infection. No pleural effusion or pneumothorax is seen. Upper Abdomen: The visualized portions of the liver and spleen are unremarkable. Musculoskeletal: No acute osseous abnormalities are identified. The visualized musculature is unremarkable in appearance. Review of the MIP images confirms the above findings. IMPRESSION: 1. No evidence of pulmonary embolus. 2. As on the recent prior CT, bilateral pneumonia is again noted. Peripheral nodular opacities are seen throughout both lungs, slightly improved from the prior study. 2.9 cm cavitary focus at the right lung base is better characterized, and is concerning for atypical infection. 3. Enlarged 1.5 cm azygoesophageal recess node. Electronically Signed   By: Roanna RaiderJeffery  Chang M.D.   On: 02/25/2017 04:52   Mr Laqueta JeanBrain W YNWo Contrast  Result Date: 02/22/2017 CLINICAL DATA:  Weakness. Endocarditis, infective suspected. Fever and chills. EXAM: MRI HEAD WITHOUT AND WITH CONTRAST TECHNIQUE: Multiplanar, multiecho pulse sequences of the brain and surrounding structures were obtained without and with intravenous contrast. CONTRAST:  15mL MULTIHANCE GADOBENATE DIMEGLUMINE 529 MG/ML IV SOLN COMPARISON:  None. FINDINGS: Brain: No acute infarct, hemorrhage, or mass lesion is present. No focal white matter lesions are present. The postcontrast images demonstrate no pathologic enhancement. Is the internal  auditory canals are within normal limits bilaterally. The brainstem and cerebellum are normal. No significant extra-axial fluid collection is present. Vascular: Flow is present in the major intracranial arteries. Skull and upper cervical spine: Skull base is within normal limits. The upper cervical spine is normal. The craniocervical junction is normal. Decreased T1 signal is compatible with known anemia. Sinuses/Orbits: The paranasal sinuses and mastoid air cells are clear. Globes and orbits are within normal limits. IMPRESSION: Negative MRI the brain. No acute or focal lesion to explain the patient's weakness. No evidence for infection. Electronically Signed   By: Marin Robertshristopher  Mattern M.D.   On: 02/22/2017 20:42   Mr Cervical Spine Wo Contrast  Result Date: 02/17/2017 CLINICAL DATA:  Lower extremity weakness. Bacteremia, fever, leukocytosis. History of intravenous drug abuse. EXAM: MRI CERVICAL AND THORACIC SPINE WITHOUT CONTRAST TECHNIQUE: Multiplanar and multiecho pulse sequences of the cervical spine, to include the craniocervical junction and cervicothoracic junction, and thoracic spine, were obtained without intravenous contrast. Intravenous access not successfully gained, no contrast administered. COMPARISON:  MRI of the lumbar spine February 14, 2017 FINDINGS: MRI CERVICAL SPINE FINDINGS-moderately motion degraded examination. ALIGNMENT: Straightened cervical lordosis.  No malalignment. VERTEBRAE/DISCS: Vertebral bodies are intact. Intervertebral disc morphology's and signal are normal. CORD:Cervical spinal cord is normal morphology and signal characteristics from the cervicomedullary junction to level of T1-2, the most caudal well visualized level. POSTERIOR FOSSA, VERTEBRAL ARTERIES, PARASPINAL TISSUES: No MR findings of ligamentous injury. Vertebral artery flow voids present. Cerebellar tonsils descend slightly below the foramen magnum though, are not pointed in appearance and, do not reach criteria  fecal ERA 1 malformation. DISC LEVELS: No disc bulge, canal stenosis nor neural foraminal narrowing. MRI THORACIC  SPINE FINDINGS-Mild motion degraded examination. ALIGNMENT: Maintenance of the thoracic kyphosis. No malalignment. VERTEBRAE/DISCS: Vertebral bodies are intact. Intervertebral discs morphology and signal are normal.12 mm T4 hemangioma. No suspicious or acute bone marrow signal. CORD: Thoracic spinal cord is normal morphology and signal characteristics to the level of the conus medullaris which terminates at T12-L1. No epidural collection by noncontrast imaging. PREVERTEBRAL AND PARASPINAL SOFT TISSUES: Small RIGHT pleural effusion. No paraspinal  fluid collection by noncontrast MRI. DISC LEVELS: No disc bulge, canal stenosis nor neural foraminal narrowing. IMPRESSION: 1. Negative moderately motion degraded noncontrast MRI of the cervical spine. 2. Negative mildly motion degraded noncontrast MRI of the thoracic spine. 3. Small RIGHT pleural effusion. Electronically Signed   By: Awilda Metro M.D.   On: 02/17/2017 04:44   Mr Thoracic Spine Wo Contrast  Result Date: 02/17/2017 CLINICAL DATA:  Lower extremity weakness. Bacteremia, fever, leukocytosis. History of intravenous drug abuse. EXAM: MRI CERVICAL AND THORACIC SPINE WITHOUT CONTRAST TECHNIQUE: Multiplanar and multiecho pulse sequences of the cervical spine, to include the craniocervical junction and cervicothoracic junction, and thoracic spine, were obtained without intravenous contrast. Intravenous access not successfully gained, no contrast administered. COMPARISON:  MRI of the lumbar spine February 14, 2017 FINDINGS: MRI CERVICAL SPINE FINDINGS-moderately motion degraded examination. ALIGNMENT: Straightened cervical lordosis.  No malalignment. VERTEBRAE/DISCS: Vertebral bodies are intact. Intervertebral disc morphology's and signal are normal. CORD:Cervical spinal cord is normal morphology and signal characteristics from the cervicomedullary  junction to level of T1-2, the most caudal well visualized level. POSTERIOR FOSSA, VERTEBRAL ARTERIES, PARASPINAL TISSUES: No MR findings of ligamentous injury. Vertebral artery flow voids present. Cerebellar tonsils descend slightly below the foramen magnum though, are not pointed in appearance and, do not reach criteria fecal ERA 1 malformation. DISC LEVELS: No disc bulge, canal stenosis nor neural foraminal narrowing. MRI THORACIC  SPINE FINDINGS-Mild motion degraded examination. ALIGNMENT: Maintenance of the thoracic kyphosis. No malalignment. VERTEBRAE/DISCS: Vertebral bodies are intact. Intervertebral discs morphology and signal are normal.12 mm T4 hemangioma. No suspicious or acute bone marrow signal. CORD: Thoracic spinal cord is normal morphology and signal characteristics to the level of the conus medullaris which terminates at T12-L1. No epidural collection by noncontrast imaging. PREVERTEBRAL AND PARASPINAL SOFT TISSUES: Small RIGHT pleural effusion. No paraspinal fluid collection by noncontrast MRI. DISC LEVELS: No disc bulge, canal stenosis nor neural foraminal narrowing. IMPRESSION: 1. Negative moderately motion degraded noncontrast MRI of the cervical spine. 2. Negative mildly motion degraded noncontrast MRI of the thoracic spine. 3. Small RIGHT pleural effusion. Electronically Signed   By: Awilda Metro M.D.   On: 02/17/2017 04:44   Mr Lumbar Spine W Wo Contrast  Result Date: 02/14/2017 CLINICAL DATA:  Fever and leukocytosis. IV drug user. Lower extremity weakness. Staph aureus bacteremia. EXAM: MRI LUMBAR SPINE WITHOUT AND WITH CONTRAST TECHNIQUE: Multiplanar and multiecho pulse sequences of the lumbar spine were obtained without and with intravenous contrast. CONTRAST:  15mL MULTIHANCE GADOBENATE DIMEGLUMINE 529 MG/ML IV SOLN COMPARISON:  07/30/2010. FINDINGS: Segmentation:  Standard Alignment:  Physiologic. Vertebrae: Low signal intensity bone marrow on T1 and T2 weighted imaging appears  similar to 2012, likely related to anemia or chronic disease. Conus medullaris and cauda equina: Conus extends to the L1 level. Conus and cauda equina appear normal. Paraspinal and other soft tissues: No visible fluid collection or mass. Disc levels: No disc protrusion or spinal stenosis. Slight disc desiccation L5-S1. Annular rent extends to the LEFT. This finding was noted previously. IMPRESSION: Unremarkable lumbar spine MRI. No evidence of diskitis or osteomyelitis. No abnormal postcontrast enhancement of visualized paravertebral soft tissues. Minor disc disease L5-S1, not significantly changed from 2012. Electronically Signed   By: Elsie Stain M.D.   On: 02/14/2017 18:29   Mr Sternum Wo Contrast  Result Date: 02/27/2017 CLINICAL DATA:  Right sternoclavicular septic arthritis. Pain with active motion. Weakness. EXAM: MRI sternum without contrast TECHNIQUE: Multiplanar, multiecho pulse sequences of the sternum without intravenous contrast. CONTRAST:  None COMPARISON:  02/25/2017 CT chest FINDINGS: Patient could not fully tolerate the exam. Coronal T2 fat saturated and postcontrast imaging was not possible. Trace intra-articular and periarticular edema about the right sternoclavicular joint is noted. Reactive marrow changes are seen about the joint. Findings are compatible with history of septic arthritis. No significant drainable fluid collections are noted. No soft tissue mass is seen. IMPRESSION: Reactive marrow changes about the right sternoclavicular joint with trace intra-articular and periarticular fluid/edema. Findings are in keeping with history of septic arthritis. The contralateral sternoclavicular joint is unremarkable. No marrow signal changes to suggest acute osteomyelitis. Electronically Signed   By: Tollie Eth M.D.   On: 02/27/2017 22:08   Mr Shoulder Right W Wo Contrast  Result Date: 02/27/2017 CLINICAL DATA:  32 year old female with right sternoclavicular septic arthritis EXAM: MRI OF  THE RIGHT SHOULDER WITHOUT AND WITH CONTRAST TECHNIQUE: Multiplanar, multisequence MR imaging of the right shoulder was performed before and after the administration of intravenous contrast. CONTRAST:  15mL MULTIHANCE GADOBENATE DIMEGLUMINE 529 MG/ML IV SOLN COMPARISON:  None. FINDINGS: Rotator cuff: Intact supraspinatus, infraspinatus, teres minor and subscapularis tendons. Muscles: No atrophy or abnormal signal of the muscles of the rotator cuff. Biceps long head:  Intact. Acromioclavicular Joint: Normal acromioclavicular joint. Type II curved acromion. Trace subacromial and subdeltoid bursal fluid and/or edema. Glenohumeral Joint: No joint effusion. No chondral defect. Labrum:  Intact. Bones: Small subcortical degenerative cystic change along the posterolateral aspect of the humeral head. Marrow edema or fracture. No joint dislocation. Other: None. IMPRESSION: 1. Minimal subacromial bursitis. 2. No rotator cuff tear, marrow signal abnormality, fracture or joint dislocation. 3. Small focus of subcortical degenerate cystic change along the posterior aspect of the humeral head. Electronically Signed   By: Tollie Eth M.D.   On: 02/27/2017 22:04   Dg Chest Port 1 View  Result Date: 02/22/2017 CLINICAL DATA:  Chest pain EXAM: PORTABLE CHEST 1 VIEW COMPARISON:  February 13, 2017 chest radiograph and chest CT February 20, 2017 FINDINGS: There is patchy airspace consolidation in the lung bases consistent with pneumonia. Lungs elsewhere are clear. Note that patchy nodular opacity seen on recent CT in the upper lobes are not appreciable by radiography. Heart is borderline enlarged with pulmonary vascularity within normal limits. No adenopathy appreciable by radiography. No bone lesions. IMPRESSION: Bibasilar airspace consolidation consistent with pneumonia. Nodular opacity seen in the upper lobe regions on recent CT are not appreciable by radiography. There is mild cardiomegaly. No adenopathy is appreciable by  radiography. Electronically Signed   By: Bretta Bang III M.D.   On: 02/22/2017 21:49   US Pelvic Complete With Transvaginal  Result Date: 02/15/2017 CLINICAL DATA:  Pelvic pain.  Pain for 1 week. EXAM: TRANSABDOMINAL AND TRANSVAGINAL ULTRASOUND OF PELVIS TECHNIQUE: Both transabdominal and transvaginal ultrasound examinations of the pelvis were performed. Transabdominal technique was performed for global imaging of the pelvis including uterus, ovaries, adnexal regions, and pelvic cul-de-sac. It was necessary to proceed with endovaginal exam following the transabdominal exam to visualize the endometrium and ovaries. COMPARISON:  None FINDINGS: Uterus Measurements: 8.9 x 4.2 x 5.1 cm. No fibroids or other mass visualized. Endometrium Thickness: 5 mm.  No focal abnormality visualized. Right ovary Measurements: 2.2 x 2.2 x 1.5 cm. Normal appearance/no adnexal mass. Left ovary Not visualized. Other findings No abnormal free fluid. IMPRESSION: 1. Nonvisualized left ovary. 2. Otherwise normal pelvic ultrasound. Electronically Signed   By: Elige Ko   On: 02/15/2017 16:25    Lab Data:  CBC: Recent Labs  Lab 03/12/17 0330 03/15/17 0426  WBC 4.4 3.8*  HGB 8.2* 8.7*  HCT 25.0* 27.1*  MCV 90.6 90.9  PLT 271 308   Basic Metabolic Panel: Recent Labs  Lab 03/12/17 0330 03/12/17 1511 03/15/17 0426  NA 139  --  139  K 3.4*  --  4.0  CL 104  --  105  CO2 25  --  25  GLUCOSE 98  --  103*  BUN 9  --  16  CREATININE 0.81  --  0.81  CALCIUM 8.5*  --  8.7*  MG  --  1.8  --    GFR: Estimated Creatinine Clearance: 105.5 mL/min (by C-G formula based on SCr of 0.81 mg/dL). Liver Function Tests: Recent Labs  Lab 03/12/17 1511  ALBUMIN 2.3*   No results for input(s): LIPASE, AMYLASE in the last 168 hours. No results for input(s): AMMONIA in the last 168 hours. Coagulation Profile: No results for input(s): INR, PROTIME in the last 168 hours. Cardiac Enzymes: No results for input(s):  CKTOTAL, CKMB, CKMBINDEX, TROPONINI in the last 168 hours. BNP (last 3 results) No results for input(s): PROBNP in the last 8760 hours. HbA1C: No results for input(s): HGBA1C in the last 72 hours. CBG: No results for input(s): GLUCAP in the last 168 hours. Lipid Profile: No results for input(s): CHOL, HDL, LDLCALC, TRIG, CHOLHDL, LDLDIRECT in the last 72 hours. Thyroid Function Tests: No results for input(s): TSH, T4TOTAL, FREET4, T3FREE, THYROIDAB in the last 72 hours. Anemia Panel: No results for input(s): VITAMINB12, FOLATE, FERRITIN, TIBC, IRON, RETICCTPCT in the last 72 hours. Urine analysis:    Component Value Date/Time   COLORURINE AMBER (A) 02/13/2017 0436   APPEARANCEUR HAZY (A) 02/13/2017 0436   LABSPEC 1.014 02/13/2017 0436   PHURINE 5.0 02/13/2017 0436   GLUCOSEU NEGATIVE 02/13/2017 0436   HGBUR LARGE (A) 02/13/2017 0436   BILIRUBINUR NEGATIVE 02/13/2017 0436   KETONESUR NEGATIVE 02/13/2017 0436   PROTEINUR NEGATIVE 02/13/2017 0436   UROBILINOGEN 0.2 12/07/2012 1510   NITRITE POSITIVE (A) 02/13/2017 0436   LEUKOCYTESUR TRACE (A) 02/13/2017 0436     Keira Bohlin M.D. [facility] 03/15/2017, 9:53 AM  Pager: [phone] Between 7am to 7pm - call Pager - [phone]  After 7pm go to [email or web address] - password [email or web address]  Call night coverage person covering after 7pm

## 2017-03-16 MED ORDER — ZOLPIDEM TARTRATE 5 MG PO TABS
5.0000 mg | ORAL_TABLET | Freq: Every evening | ORAL | Status: DC | PRN
  Administered 2017-03-16 – 2017-03-24 (×4): 5 mg via ORAL
  Filled 2017-03-16 (×4): qty 1

## 2017-03-16 NOTE — Progress Notes (Signed)
[facility]                                                                              Patient Demographics  [name], is a 32 y.o. female, DOB - [birth year], WJX:[id number]  Admit date - 02/13/2017   Admitting Physician Carron Curie, MD  Outpatient Primary MD for the patient is Pearson Grippe, MD  Outpatient specialists:   LOS - 31  days   Medical records reviewed and are as summarized below:    Chief Complaint  Patient presents with  . Weakness       Brief summary   32 year old female with past medical history significant for chronic anemia and IV drug use. Patient presented to the emergency room on 02/13/2017 with complaints of fevers and chills and generalized weakness for 2 days prior to presentation. Patient was noted to have erythema and petechiae on her left hand and lower extremities and found to have MSSA bacteremia/sepsis. 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 Patient was not felt to be a candidate for valve replacement as per CT surgery due to her history of ongoing drug use. Patient was also found to have right sternoclavicular septic arthritis. Patient was seen and evaluated by infectious disease. Patient is currently on IV nafcillin, and patient with complete 6-8 weeks of IV antibiotics therapy (antibiotic therapy suspected to and on 04 /2/19). PICC line has been placed. Patient is not able to go home with PICC line given patient's illicit drug use habit. No facility is willing to accept patient. The plan is for the patient to remain in the hospital and complete the course of IV antibiotics.    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  -Right sternoclavicular septic  arthritis all secondary to IV drug use and septic Staphylococcus infection -Repeat blood cultures 2/6 has been negative so far.  -Patient will complete 6-8 weeks of IV Nafcillin (this will be completed on 04/15/2017), will remain in the hospital until then -ID had concern regarding risk of embolization across the PFO to CNS or kidneys in the left sided circulation that could occur even if vegetation is sterile. Dr. Tyrone Sage does not recommend TVR in context of her ongoing drug use.  -Dr Algis Liming recommended repeat 2-D echo and likely repeat TEE to assess for vegetations in next 4 weeks, CT of the clavicle by 3/15. Follow MRI of sternum, ordered for 3/15   Anemia of chronic disease -Hemoglobin has been stable between 7-8 -CBC in a.m.  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  Septic arthritis of right sternoclavicular  joint (HCC)  -Complete course of antibiotics.  PFO (patent foramen ovale)  Tricuspid valve vegetation  Depression -On Cymbalta and Zyprexa  Chronic diastolic heart failure  -compensated, received Lasix  - BNP slightly elevated at 222  B/l LE edema -due to severe protein calorie malnutrition albumin 1.6.  - Received Lasix 40 mg 2 doses on 3/1  Code Status: full CODE STATUS DVT Prophylaxis:  Lovenox  Family Communication: Discussed in detail with the patient, all imaging results, lab results explained to the patient   Disposition Plan: to stay inpatient till completes antibiotics by 4/2   Time Spent in minutes  25  minutes  Procedures:  2-D echo  2/1 showed grade 1 diastolic dysfunction, mild tricuspid regurgitation TEE 2/7 vegetation noted on tricuspid valve with large PFO  Consultants:    infectious disease Cardiology CT surgery  Antimicrobials:  IV Ancef 2/-2/8 Zyvox 2/5-2/6, 2/17-/18 IV nafcillin 2/8-present IV vancomycin and Zosyn 1/31-2/1      Medications  Scheduled Meds: . buprenorphine-naloxone  2 tablet Sublingual Daily  . DULoxetine  60 mg Oral BID  . enoxaparin (LOVENOX) injection  40 mg Subcutaneous Q24H  . ferrous sulfate  325 mg Oral TID WC  . gadobenate dimeglumine  15 mL Intravenous Once  . multivitamin with minerals  1 tablet Oral Daily  . OLANZapine  5 mg Oral q morning - 10a  . polyethylene glycol  17 g Oral Daily  . senna-docusate  1 tablet Oral BID  . sodium chloride flush  10-40 mL Intracatheter Q12H   Continuous Infusions: . nafcillin IV Stopped (03/16/17 0836)   PRN Meds:.acetaminophen, ALPRAZolam, cyclobenzaprine, magic mouthwash, ondansetron **OR** ondansetron (ZOFRAN) IV, sodium chloride flush, sodium chloride flush, zolpidem   Antibiotics   Anti-infectives (From admission, onward)   Start     Dose/Rate Route Frequency Ordered Stop   03/02/17 2200  linezolid (ZYVOX) tablet 600 mg  Status:  Discontinued     600 mg Oral Every 12 hours 03/02/17 2037 03/03/17 1203   02/24/17 1600  nafcillin 2 g in sodium chloride 0.9 % 100 mL IVPB     2 g 200 mL/hr over 30 Minutes Intravenous Every 4 hours 02/24/17 0833     02/21/17 2000  nafcillin 2 g in dextrose 5 % 100 mL IVPB  Status:  Discontinued     2 g 200 mL/hr over 30 Minutes Intravenous Every 4 hours 02/21/17 1741 02/24/17 0910   02/21/17 1730  nafcillin injection 2 g  Status:  Discontinued     2 g Intravenous Every 4 hours 02/21/17 1726 02/21/17 1740   02/19/17 1230  ceFAZolin (ANCEF) IVPB 2g/100 mL premix  Status:  Discontinued     2 g 200 mL/hr over 30 Minutes Intravenous Every 8 hours 02/19/17 1148 02/21/17 1726   02/18/17 1330   linezolid (ZYVOX) tablet 600 mg  Status:  Discontinued     600 mg Oral Every 12 hours 02/18/17 1250 02/19/17 1126   02/17/17 0800  sulfamethoxazole-trimethoprim (BACTRIM DS,SEPTRA DS) 800-160 MG per tablet 2 tablet  Status:  Discontinued     2 tablet Oral Every 12 hours 02/16/17 1453 02/18/17 1250   02/16/17 1600  sulfamethoxazole-trimethoprim (BACTRIM DS,SEPTRA DS) 800-160 MG per tablet 2 tablet     2 tablet Oral  Once 02/16/17 1509 02/16/17 1657   02/14/17 1400  ceFAZolin (ANCEF) IVPB 2g/100 mL premix  Status:  Discontinued     2 g 200 mL/hr over 30 Minutes Intravenous Every 8 hours 02/14/17 1013 02/16/17 1453  02/14/17 0200  vancomycin (VANCOCIN) IVPB 750 mg/150 ml premix  Status:  Discontinued     750 mg 150 mL/hr over 60 Minutes Intravenous Every 8 hours 02/13/17 1739 02/14/17 1017   02/14/17 0000  piperacillin-tazobactam (ZOSYN) IVPB 3.375 g  Status:  Discontinued     3.375 g 12.5 mL/hr over 240 Minutes Intravenous Every 8 hours 02/13/17 1739 02/14/17 1013   02/13/17 1715  piperacillin-tazobactam (ZOSYN) IVPB 3.375 g     3.375 g 100 mL/hr over 30 Minutes Intravenous  Once 02/13/17 1701 02/13/17 1824   02/13/17 1715  vancomycin (VANCOCIN) IVPB 1000 mg/200 mL premix  Status:  Discontinued     1,000 mg 200 mL/hr over 60 Minutes Intravenous  Once 02/13/17 1701 02/13/17 1705   02/13/17 1715  vancomycin (VANCOCIN) 1,500 mg in sodium chloride 0.9 % 500 mL IVPB     1,500 mg 250 mL/hr over 120 Minutes Intravenous  Once 02/13/17 1705 02/13/17 2300        Subjective:   Deyja Sochacki was seen and examined today.  Right shoulder still hurts, no chest pain or shortness of breath. Patient denies dizziness, abdominal pain, N/V/D/C, new weakness, numbess, tingling. No acute events overnight.  Ambulating in the room without any difficulty., No fevers or chills per  Objective:   Vitals:   03/15/17 0502 03/15/17 1448 03/15/17 2109 03/16/17 0700  BP: 117/76 111/79 111/76 124/90  Pulse: 76 100  76 94  Resp: 16 16 16 16   Temp: 98.3 F (36.8 C) 97.7 F (36.5 C) 97.8 F (36.6 C) (!) 97.3 F (36.3 C)  TempSrc: Oral Oral Oral Oral  SpO2: 98% 98% 98% 100%  Weight:    76.2 kg (168 lb)  Height:        Intake/Output Summary (Last 24 hours) at 03/16/2017 1145 Last data filed at 03/16/2017 1048 Gross per 24 hour  Intake 700 ml  Output -  Net 700 ml     Wt Readings from Last 3 Encounters:  03/16/17 76.2 kg (168 lb)  02/10/17 73.9 kg (163 lb)  06/13/16 73 kg (161 lb)     Exam General: Alert and oriented x 3, NAD Eyes:  HEENT:   Cardiovascular: S1 S2 clear, RRR Respiratory: CTAB Gastrointestinal: Soft, nontender, nondistended, + bowel sounds Ext: no pedal edema bilaterally Neuro: no new deficit Musculoskeletal: No digital cyanosis, clubbing Skin: No rashes Psych: Normal affect and demeanor, alert and oriented x3      Data Reviewed:  I have personally reviewed following labs and imaging studies  Micro Results No results found for this or any previous visit (from the past 240 hour(s)).  Radiology Reports Dg Shoulder Right  Result Date: 03/14/2017 CLINICAL DATA:  Anterior and posterior RIGHT shoulder pain, septic RIGHT sternoclavicular joint since 02/13/2017, smoker EXAM: RIGHT SHOULDER - 2+ VIEW COMPARISON:  RIGHT shoulder MRI 02/27/2017 FINDINGS: Osseous mineralization normal. AC joint alignment normal. No glenohumeral fracture, dislocation, or bone destruction. Visualized ribs intact. RIGHT arm PICC line noted. IMPRESSION: No acute osseous abnormalities. Electronically Signed   By: Ulyses Southward M.D.   On: 03/14/2017 10:23   Ct Chest W Contrast  Result Date: 02/20/2017 CLINICAL DATA:  Acute onset of generalized chest pain. Assess for sternoclavicular septic arthritis. EXAM: CT CHEST WITH CONTRAST TECHNIQUE: Multidetector CT imaging of the chest was performed during intravenous contrast administration. CONTRAST:  75 mL ISOVUE-300 IOPAMIDOL (ISOVUE-300) INJECTION 61%  COMPARISON:  MRI of the thoracic spine performed 02/17/2017 FINDINGS: Cardiovascular: The heart is borderline normal in size.  The thoracic aorta is grossly unremarkable. A retroesophageal right subclavian artery is incidentally noted. The great vessels are unremarkable in appearance. Mediastinum/Nodes: Enlarged mediastinal nodes are noted at the subcarinal and azygoesophageal regions, aortopulmonary window, right paratracheal region and periaortic region, measuring up to 1.4 cm in short axis. Mildly prominent bilateral hilar nodes measure up to the 1.2 cm in short axis. No pericardial effusion is identified. The visualized portions of the thyroid gland are unremarkable. No axillary lymphadenopathy is appreciated. Lungs/Pleura: There is patchy airspace opacification involving the lower lobes bilaterally. Scattered nodular opacities are seen throughout both lungs. A small cavitary nodule is suggested at the right lung apex. This may reflect diffuse infection, possibly atypical in nature. Underlying metastatic disease cannot be excluded. Trace bilateral pleural fluid is noted.  No pneumothorax is seen. Upper Abdomen: The visualized portions of the liver are unremarkable. The spleen is enlarged, measuring 14.6 cm in length. The patient is status post cholecystectomy, with clips noted at the gallbladder fossa. The visualized portions of the pancreas and adrenal glands are within normal limits. Musculoskeletal: There is asymmetric prominence of soft tissue density tracking about the right sternoclavicular joint, with mild underlying soft tissue inflammation. Given clinical concern, this could reflect right sternoclavicular joint septic arthritis. IMPRESSION: 1. Patchy airspace opacification involving the lower lung lobes bilaterally. Scattered nodular opacities throughout both lungs. Small cavitary nodule suggested at the right lung apex. Findings are concerning for diffuse infection, possibly atypical in nature.  Underlying metastatic disease cannot be excluded. Would correlate clinically, and perform follow-up CT of the chest 3-4 weeks after completion of treatment for pneumonia. 2. Trace bilateral pleural fluid noted. 3. Asymmetric prominence of soft tissue density tracking about the right sternoclavicular joint, with mild underlying soft tissue inflammation. Given clinical concern, this could reflect right-sided clavicular joint septic arthritis. 4. Diffusely prominent mediastinal and bilateral hilar nodes noted. This may reflect the underlying infection. 5. Retroesophageal right subclavian artery incidentally noted. Electronically Signed   By: Roanna RaiderJeffery  Chang M.D.   On: 02/20/2017 01:40   Ct Angio Chest Pe W Or Wo Contrast  Result Date: 02/25/2017 CLINICAL DATA:  Acute onset of pleuritic chest pain and shortness of breath. Elevated D-dimer. EXAM: CT ANGIOGRAPHY CHEST WITH CONTRAST TECHNIQUE: Multidetector CT imaging of the chest was performed using the standard protocol during bolus administration of intravenous contrast. Multiplanar CT image reconstructions and MIPs were obtained to evaluate the vascular anatomy. CONTRAST:  100mL ISOVUE-370 IOPAMIDOL (ISOVUE-370) INJECTION 76% COMPARISON:  Chest radiograph performed 02/23/2016, and CT of the chest performed 02/20/2017 FINDINGS: Cardiovascular:  There is no evidence of pulmonary embolus. The heart is borderline enlarged. The thoracic aorta is grossly unremarkable in appearance. The great vessels are within normal limits. Mediastinum/Nodes: A 1.5 cm azygoesophageal recess node is noted. Visualized hilar nodes are borderline normal in size. No pericardial effusion is identified. Incidental note is made of a retroesophageal right subclavian artery. The visualized portions of the thyroid gland are unremarkable. No axillary lymphadenopathy is appreciated. Lungs/Pleura: Patchy bilateral airspace opacities raise concern for pneumonia, most prominent at the lower lobes  bilaterally. Scattered peripheral nodular opacities are noted throughout both lungs. This is slightly improved from the prior study. There is a 2.9 cm cavitary focus at the right lung base, concerning for atypical infection. No pleural effusion or pneumothorax is seen. Upper Abdomen: The visualized portions of the liver and spleen are unremarkable. Musculoskeletal: No acute osseous abnormalities are identified. The visualized musculature is unremarkable in appearance. Review of the MIP images  confirms the above findings. IMPRESSION: 1. No evidence of pulmonary embolus. 2. As on the recent prior CT, bilateral pneumonia is again noted. Peripheral nodular opacities are seen throughout both lungs, slightly improved from the prior study. 2.9 cm cavitary focus at the right lung base is better characterized, and is concerning for atypical infection. 3. Enlarged 1.5 cm azygoesophageal recess node. Electronically Signed   By: Roanna Raider M.D.   On: 02/25/2017 04:52   Mr Laqueta Jean ZO Contrast  Result Date: 02/22/2017 CLINICAL DATA:  Weakness. Endocarditis, infective suspected. Fever and chills. EXAM: MRI HEAD WITHOUT AND WITH CONTRAST TECHNIQUE: Multiplanar, multiecho pulse sequences of the brain and surrounding structures were obtained without and with intravenous contrast. CONTRAST:  15mL MULTIHANCE GADOBENATE DIMEGLUMINE 529 MG/ML IV SOLN COMPARISON:  None. FINDINGS: Brain: No acute infarct, hemorrhage, or mass lesion is present. No focal white matter lesions are present. The postcontrast images demonstrate no pathologic enhancement. Is the internal auditory canals are within normal limits bilaterally. The brainstem and cerebellum are normal. No significant extra-axial fluid collection is present. Vascular: Flow is present in the major intracranial arteries. Skull and upper cervical spine: Skull base is within normal limits. The upper cervical spine is normal. The craniocervical junction is normal. Decreased T1 signal  is compatible with known anemia. Sinuses/Orbits: The paranasal sinuses and mastoid air cells are clear. Globes and orbits are within normal limits. IMPRESSION: Negative MRI the brain. No acute or focal lesion to explain the patient's weakness. No evidence for infection. Electronically Signed   By: Marin Roberts M.D.   On: 02/22/2017 20:42   Mr Cervical Spine Wo Contrast  Result Date: 02/17/2017 CLINICAL DATA:  Lower extremity weakness. Bacteremia, fever, leukocytosis. History of intravenous drug abuse. EXAM: MRI CERVICAL AND THORACIC SPINE WITHOUT CONTRAST TECHNIQUE: Multiplanar and multiecho pulse sequences of the cervical spine, to include the craniocervical junction and cervicothoracic junction, and thoracic spine, were obtained without intravenous contrast. Intravenous access not successfully gained, no contrast administered. COMPARISON:  MRI of the lumbar spine February 14, 2017 FINDINGS: MRI CERVICAL SPINE FINDINGS-moderately motion degraded examination. ALIGNMENT: Straightened cervical lordosis.  No malalignment. VERTEBRAE/DISCS: Vertebral bodies are intact. Intervertebral disc morphology's and signal are normal. CORD:Cervical spinal cord is normal morphology and signal characteristics from the cervicomedullary junction to level of T1-2, the most caudal well visualized level. POSTERIOR FOSSA, VERTEBRAL ARTERIES, PARASPINAL TISSUES: No MR findings of ligamentous injury. Vertebral artery flow voids present. Cerebellar tonsils descend slightly below the foramen magnum though, are not pointed in appearance and, do not reach criteria fecal ERA 1 malformation. DISC LEVELS: No disc bulge, canal stenosis nor neural foraminal narrowing. MRI THORACIC  SPINE FINDINGS-Mild motion degraded examination. ALIGNMENT: Maintenance of the thoracic kyphosis. No malalignment. VERTEBRAE/DISCS: Vertebral bodies are intact. Intervertebral discs morphology and signal are normal.12 mm T4 hemangioma. No suspicious or acute bone  marrow signal. CORD: Thoracic spinal cord is normal morphology and signal characteristics to the level of the conus medullaris which terminates at T12-L1. No epidural collection by noncontrast imaging. PREVERTEBRAL AND PARASPINAL SOFT TISSUES: Small RIGHT pleural effusion. No paraspinal fluid collection by noncontrast MRI. DISC LEVELS: No disc bulge, canal stenosis nor neural foraminal narrowing. IMPRESSION: 1. Negative moderately motion degraded noncontrast MRI of the cervical spine. 2. Negative mildly motion degraded noncontrast MRI of the thoracic spine. 3. Small RIGHT pleural effusion. Electronically Signed   By: Awilda Metro M.D.   On: 02/17/2017 04:44   Mr Thoracic Spine Wo Contrast  Result Date: 02/17/2017 CLINICAL DATA:  Lower extremity weakness. Bacteremia, fever, leukocytosis. History of intravenous drug abuse. EXAM: MRI CERVICAL AND THORACIC SPINE WITHOUT CONTRAST TECHNIQUE: Multiplanar and multiecho pulse sequences of the cervical spine, to include the craniocervical junction and cervicothoracic junction, and thoracic spine, were obtained without intravenous contrast. Intravenous access not successfully gained, no contrast administered. COMPARISON:  MRI of the lumbar spine February 14, 2017 FINDINGS: MRI CERVICAL SPINE FINDINGS-moderately motion degraded examination. ALIGNMENT: Straightened cervical lordosis.  No malalignment. VERTEBRAE/DISCS: Vertebral bodies are intact. Intervertebral disc morphology's and signal are normal. CORD:Cervical spinal cord is normal morphology and signal characteristics from the cervicomedullary junction to level of T1-2, the most caudal well visualized level. POSTERIOR FOSSA, VERTEBRAL ARTERIES, PARASPINAL TISSUES: No MR findings of ligamentous injury. Vertebral artery flow voids present. Cerebellar tonsils descend slightly below the foramen magnum though, are not pointed in appearance and, do not reach criteria fecal ERA 1 malformation. DISC LEVELS: No disc bulge,  canal stenosis nor neural foraminal narrowing. MRI THORACIC  SPINE FINDINGS-Mild motion degraded examination. ALIGNMENT: Maintenance of the thoracic kyphosis. No malalignment. VERTEBRAE/DISCS: Vertebral bodies are intact. Intervertebral discs morphology and signal are normal.12 mm T4 hemangioma. No suspicious or acute bone marrow signal. CORD: Thoracic spinal cord is normal morphology and signal characteristics to the level of the conus medullaris which terminates at T12-L1. No epidural collection by noncontrast imaging. PREVERTEBRAL AND PARASPINAL SOFT TISSUES: Small RIGHT pleural effusion. No paraspinal fluid collection by noncontrast MRI. DISC LEVELS: No disc bulge, canal stenosis nor neural foraminal narrowing. IMPRESSION: 1. Negative moderately motion degraded noncontrast MRI of the cervical spine. 2. Negative mildly motion degraded noncontrast MRI of the thoracic spine. 3. Small RIGHT pleural effusion. Electronically Signed   By: Awilda Metro M.D.   On: 02/17/2017 04:44   Mr Lumbar Spine W Wo Contrast  Result Date: 02/14/2017 CLINICAL DATA:  Fever and leukocytosis. IV drug user. Lower extremity weakness. Staph aureus bacteremia. EXAM: MRI LUMBAR SPINE WITHOUT AND WITH CONTRAST TECHNIQUE: Multiplanar and multiecho pulse sequences of the lumbar spine were obtained without and with intravenous contrast. CONTRAST:  15mL MULTIHANCE GADOBENATE DIMEGLUMINE 529 MG/ML IV SOLN COMPARISON:  07/30/2010. FINDINGS: Segmentation:  Standard Alignment:  Physiologic. Vertebrae: Low signal intensity bone marrow on T1 and T2 weighted imaging appears similar to 2012, likely related to anemia or chronic disease. Conus medullaris and cauda equina: Conus extends to the L1 level. Conus and cauda equina appear normal. Paraspinal and other soft tissues: No visible fluid collection or mass. Disc levels: No disc protrusion or spinal stenosis. Slight disc desiccation L5-S1. Annular rent extends to the LEFT. This finding was noted  previously. IMPRESSION: Unremarkable lumbar spine MRI. No evidence of diskitis or osteomyelitis. No abnormal postcontrast enhancement of visualized paravertebral soft tissues. Minor disc disease L5-S1, not significantly changed from 2012. Electronically Signed   By: Elsie Stain M.D.   On: 02/14/2017 18:29   Mr Sternum Wo Contrast  Result Date: 02/27/2017 CLINICAL DATA:  Right sternoclavicular septic arthritis. Pain with active motion. Weakness. EXAM: MRI sternum without contrast TECHNIQUE: Multiplanar, multiecho pulse sequences of the sternum without intravenous contrast. CONTRAST:  None COMPARISON:  02/25/2017 CT chest FINDINGS: Patient could not fully tolerate the exam. Coronal T2 fat saturated and postcontrast imaging was not possible. Trace intra-articular and periarticular edema about the right sternoclavicular joint is noted. Reactive marrow changes are seen about the joint. Findings are compatible with history of septic arthritis. No significant drainable fluid collections are noted. No soft tissue mass is seen. IMPRESSION: Reactive marrow changes about the  right sternoclavicular joint with trace intra-articular and periarticular fluid/edema. Findings are in keeping with history of septic arthritis. The contralateral sternoclavicular joint is unremarkable. No marrow signal changes to suggest acute osteomyelitis. Electronically Signed   By: Tollie Eth M.D.   On: 02/27/2017 22:08   Mr Shoulder Right W Wo Contrast  Result Date: 02/27/2017 CLINICAL DATA:  32 year old female with right sternoclavicular septic arthritis EXAM: MRI OF THE RIGHT SHOULDER WITHOUT AND WITH CONTRAST TECHNIQUE: Multiplanar, multisequence MR imaging of the right shoulder was performed before and after the administration of intravenous contrast. CONTRAST:  15mL MULTIHANCE GADOBENATE DIMEGLUMINE 529 MG/ML IV SOLN COMPARISON:  None. FINDINGS: Rotator cuff: Intact supraspinatus, infraspinatus, teres minor and subscapularis tendons.  Muscles: No atrophy or abnormal signal of the muscles of the rotator cuff. Biceps long head:  Intact. Acromioclavicular Joint: Normal acromioclavicular joint. Type II curved acromion. Trace subacromial and subdeltoid bursal fluid and/or edema. Glenohumeral Joint: No joint effusion. No chondral defect. Labrum:  Intact. Bones: Small subcortical degenerative cystic change along the posterolateral aspect of the humeral head. Marrow edema or fracture. No joint dislocation. Other: None. IMPRESSION: 1. Minimal subacromial bursitis. 2. No rotator cuff tear, marrow signal abnormality, fracture or joint dislocation. 3. Small focus of subcortical degenerate cystic change along the posterior aspect of the humeral head. Electronically Signed   By: Tollie Eth M.D.   On: 02/27/2017 22:04   Dg Chest Port 1 View  Result Date: 02/22/2017 CLINICAL DATA:  Chest pain EXAM: PORTABLE CHEST 1 VIEW COMPARISON:  February 13, 2017 chest radiograph and chest CT February 20, 2017 FINDINGS: There is patchy airspace consolidation in the lung bases consistent with pneumonia. Lungs elsewhere are clear. Note that patchy nodular opacity seen on recent CT in the upper lobes are not appreciable by radiography. Heart is borderline enlarged with pulmonary vascularity within normal limits. No adenopathy appreciable by radiography. No bone lesions. IMPRESSION: Bibasilar airspace consolidation consistent with pneumonia. Nodular opacity seen in the upper lobe regions on recent CT are not appreciable by radiography. There is mild cardiomegaly. No adenopathy is appreciable by radiography. Electronically Signed   By: Bretta Bang III M.D.   On: 02/22/2017 21:49   US Pelvic Complete With Transvaginal  Result Date: 02/15/2017 CLINICAL DATA:  Pelvic pain.  Pain for 1 week. EXAM: TRANSABDOMINAL AND TRANSVAGINAL ULTRASOUND OF PELVIS TECHNIQUE: Both transabdominal and transvaginal ultrasound examinations of the pelvis were performed. Transabdominal  technique was performed for global imaging of the pelvis including uterus, ovaries, adnexal regions, and pelvic cul-de-sac. It was necessary to proceed with endovaginal exam following the transabdominal exam to visualize the endometrium and ovaries. COMPARISON:  None FINDINGS: Uterus Measurements: 8.9 x 4.2 x 5.1 cm. No fibroids or other mass visualized. Endometrium Thickness: 5 mm.  No focal abnormality visualized. Right ovary Measurements: 2.2 x 2.2 x 1.5 cm. Normal appearance/no adnexal mass. Left ovary Not visualized. Other findings No abnormal free fluid. IMPRESSION: 1. Nonvisualized left ovary. 2. Otherwise normal pelvic ultrasound. Electronically Signed   By: Elige Ko   On: 02/15/2017 16:25    Lab Data:  CBC: Recent Labs  Lab 03/12/17 0330 03/15/17 0426  WBC 4.4 3.8*  HGB 8.2* 8.7*  HCT 25.0* 27.1*  MCV 90.6 90.9  PLT 271 308   Basic Metabolic Panel: Recent Labs  Lab 03/12/17 0330 03/12/17 1511 03/15/17 0426  NA 139  --  139  K 3.4*  --  4.0  CL 104  --  105  CO2 25  --  25  GLUCOSE 98  --  103*  BUN 9  --  16  CREATININE 0.81  --  0.81  CALCIUM 8.5*  --  8.7*  MG  --  1.8  --    GFR: Estimated Creatinine Clearance: 106.1 mL/min (by C-G formula based on SCr of 0.81 mg/dL). Liver Function Tests: Recent Labs  Lab 03/12/17 1511  ALBUMIN 2.3*   No results for input(s): LIPASE, AMYLASE in the last 168 hours. No results for input(s): AMMONIA in the last 168 hours. Coagulation Profile: No results for input(s): INR, PROTIME in the last 168 hours. Cardiac Enzymes: No results for input(s): CKTOTAL, CKMB, CKMBINDEX, TROPONINI in the last 168 hours. BNP (last 3 results) No results for input(s): PROBNP in the last 8760 hours. HbA1C: No results for input(s): HGBA1C in the last 72 hours. CBG: No results for input(s): GLUCAP in the last 168 hours. Lipid Profile: No results for input(s): CHOL, HDL, LDLCALC, TRIG, CHOLHDL, LDLDIRECT in the last 72 hours. Thyroid  Function Tests: No results for input(s): TSH, T4TOTAL, FREET4, T3FREE, THYROIDAB in the last 72 hours. Anemia Panel: No results for input(s): VITAMINB12, FOLATE, FERRITIN, TIBC, IRON, RETICCTPCT in the last 72 hours. Urine analysis:    Component Value Date/Time   COLORURINE AMBER (A) 02/13/2017 0436   APPEARANCEUR HAZY (A) 02/13/2017 0436   LABSPEC 1.014 02/13/2017 0436   PHURINE 5.0 02/13/2017 0436   GLUCOSEU NEGATIVE 02/13/2017 0436   HGBUR LARGE (A) 02/13/2017 0436   BILIRUBINUR NEGATIVE 02/13/2017 0436   KETONESUR NEGATIVE 02/13/2017 0436   PROTEINUR NEGATIVE 02/13/2017 0436   UROBILINOGEN 0.2 12/07/2012 1510   NITRITE POSITIVE (A) 02/13/2017 0436   LEUKOCYTESUR TRACE (A) 02/13/2017 0436     Asriel Westrup M.D. [facility] 03/16/2017, 11:45 AM  Pager: [phone] Between 7am to 7pm - call Pager - [phone]  After 7pm go to [email or web address] - password [email or web address]  Call night coverage person covering after 7pm

## 2017-03-17 LAB — BASIC METABOLIC PANEL
ANION GAP: 7 (ref 5–15)
BUN: 16 mg/dL (ref 6–20)
CHLORIDE: 109 mmol/L (ref 101–111)
CO2: 22 mmol/L (ref 22–32)
Calcium: 8.7 mg/dL — ABNORMAL LOW (ref 8.9–10.3)
Creatinine, Ser: 0.78 mg/dL (ref 0.44–1.00)
GFR calc Af Amer: >60 mL/min (ref >60)
GFR calc non Af Amer: >60 mL/min (ref >60)
GLUCOSE: 106 mg/dL — AB (ref 65–99)
POTASSIUM: 3.9 mmol/L (ref 3.5–5.1)
Sodium: 138 mmol/L (ref 135–145)

## 2017-03-17 LAB — CBC
HEMATOCRIT: 28.5 % — AB (ref 36.0–46.0)
Hemoglobin: 9.1 g/dL — ABNORMAL LOW (ref 12.0–15.0)
MCH: 29.2 pg (ref 26.0–34.0)
MCHC: 31.9 g/dL (ref 30.0–36.0)
MCV: 91.3 fL (ref 78.0–100.0)
Platelets: 354 10*3/uL (ref 150–400)
RBC: 3.12 MIL/uL — AB (ref 3.87–5.11)
RDW: 19.4 % — ABNORMAL HIGH (ref 11.5–15.5)
WBC: 2.5 10*3/uL — AB (ref 4.0–10.5)

## 2017-03-17 NOTE — Progress Notes (Signed)
[facility]                                                                              Patient Demographics  [name], is a 32 y.o. female, DOB - Aug 07, 1985, ZOX:[id number]  Admit date - 02/13/2017   Admitting Physician Carron Curie, MD  Outpatient Primary MD for the patient is Pearson Grippe, MD  Outpatient specialists:   LOS - 32  days   Medical records reviewed and are as summarized below:    Chief Complaint  Patient presents with  . Weakness       Brief summary   32 year old female with past medical history significant for chronic anemia and IV drug use. Patient presented to the emergency room on 02/13/2017 with complaints of fevers and chills and generalized weakness for 2 days prior to presentation. Patient was noted to have erythema and petechiae on her left hand and lower extremities and found to have MSSA bacteremia/sepsis. 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 Patient was not felt to be a candidate for valve replacement as per CT surgery due to her history of ongoing drug use. Patient was also found to have right sternoclavicular septic arthritis. Patient was seen and evaluated by infectious disease. Patient is currently on IV nafcillin, and patient with complete 6-8 weeks of IV antibiotics therapy (antibiotic therapy suspected to and on 04 /2/19). PICC line has been placed. Patient is not able to go home with PICC line given patient's illicit drug use habit. No facility is willing to accept patient. The plan is for the patient to remain in the hospital and complete the course of IV antibiotics.    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  -Right sternoclavicular septic  arthritis all secondary to IV drug use and septic Staphylococcus infection -Repeat blood cultures 2/6 has been negative so far.  -Patient will complete 6-8 weeks of IV Nafcillin (this will be completed on 04/15/2017), will remain in the hospital until then -ID had concern regarding risk of embolization across the PFO to CNS or kidneys in the left sided circulation that could occur even if vegetation is sterile. Dr. Tyrone Sage does not recommend TVR in context of her ongoing drug use.  -Dr Algis Liming recommended repeat 2-D echo and likely repeat TEE to assess for vegetations in next 4 weeks, CT of the clavicle by 3/15. 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  Septic arthritis of right  sternoclavicular joint (HCC)  -Complete course of antibiotics.  PFO (patent foramen ovale)  Tricuspid valve vegetation  Depression -On Cymbalta and Zyprexa  Chronic diastolic heart failure  -compensated, received Lasix  - BNP slightly elevated at 222  B/l LE edema -due to severe protein calorie malnutrition albumin 1.6.  - Received Lasix 40 mg 2 doses on 3/1  Code Status: full CODE STATUS DVT Prophylaxis:  Lovenox  Family Communication: Discussed in detail with the patient, all imaging results, lab results explained to the patient   Disposition Plan: to stay inpatient till completes antibiotics by 4/2   Time Spent in minutes  25   minutes  Procedures:  2-D echo 2/1 showed grade 1 diastolic dysfunction, mild tricuspid regurgitation TEE 2/7 vegetation noted on tricuspid valve with large PFO  Consultants:    infectious disease Cardiology CT surgery  Antimicrobials:  IV Ancef 2/-2/8 Zyvox 2/5-2/6, 2/17-/18 IV nafcillin 2/8-present IV vancomycin and Zosyn 1/31-2/1      Medications  Scheduled Meds: . buprenorphine-naloxone  2 tablet Sublingual Daily  . DULoxetine  60 mg Oral BID  . enoxaparin (LOVENOX) injection  40 mg Subcutaneous Q24H  . ferrous sulfate  325 mg Oral TID WC  . gadobenate dimeglumine  15 mL Intravenous Once  . multivitamin with minerals  1 tablet Oral Daily  . OLANZapine  5 mg Oral q morning - 10a  . polyethylene glycol  17 g Oral Daily  . senna-docusate  1 tablet Oral BID  . sodium chloride flush  10-40 mL Intracatheter Q12H   Continuous Infusions: . nafcillin IV Stopped (03/17/17 1130)   PRN Meds:.acetaminophen, ALPRAZolam, cyclobenzaprine, magic mouthwash, ondansetron **OR** ondansetron (ZOFRAN) IV, sodium chloride flush, sodium chloride flush, zolpidem   Antibiotics   Anti-infectives (From admission, onward)   Start     Dose/Rate Route Frequency Ordered Stop   03/02/17 2200  linezolid (ZYVOX) tablet 600 mg  Status:  Discontinued     600 mg Oral Every 12 hours 03/02/17 2037 03/03/17 1203   02/24/17 1600  nafcillin 2 g in sodium chloride 0.9 % 100 mL IVPB     2 g 200 mL/hr over 30 Minutes Intravenous Every 4 hours 02/24/17 0833     02/21/17 2000  nafcillin 2 g in dextrose 5 % 100 mL IVPB  Status:  Discontinued     2 g 200 mL/hr over 30 Minutes Intravenous Every 4 hours 02/21/17 1741 02/24/17 0910   02/21/17 1730  nafcillin injection 2 g  Status:  Discontinued     2 g Intravenous Every 4 hours 02/21/17 1726 02/21/17 1740   02/19/17 1230  ceFAZolin (ANCEF) IVPB 2g/100 mL premix  Status:  Discontinued     2 g 200 mL/hr over 30 Minutes Intravenous Every 8 hours 02/19/17 1148  02/21/17 1726   02/18/17 1330  linezolid (ZYVOX) tablet 600 mg  Status:  Discontinued     600 mg Oral Every 12 hours 02/18/17 1250 02/19/17 1126   02/17/17 0800  sulfamethoxazole-trimethoprim (BACTRIM DS,SEPTRA DS) 800-160 MG per tablet 2 tablet  Status:  Discontinued     2 tablet Oral Every 12 hours 02/16/17 1453 02/18/17 1250   02/16/17 1600  sulfamethoxazole-trimethoprim (BACTRIM DS,SEPTRA DS) 800-160 MG per tablet 2 tablet     2 tablet Oral  Once 02/16/17 1509 02/16/17 1657   02/14/17 1400  ceFAZolin (ANCEF) IVPB 2g/100 mL premix  Status:  Discontinued     2 g 200 mL/hr over 30 Minutes Intravenous Every 8 hours 02/14/17 1013 02/16/17  1453   02/14/17 0200  vancomycin (VANCOCIN) IVPB 750 mg/150 ml premix  Status:  Discontinued     750 mg 150 mL/hr over 60 Minutes Intravenous Every 8 hours 02/13/17 1739 02/14/17 1017   02/14/17 0000  piperacillin-tazobactam (ZOSYN) IVPB 3.375 g  Status:  Discontinued     3.375 g 12.5 mL/hr over 240 Minutes Intravenous Every 8 hours 02/13/17 1739 02/14/17 1013   02/13/17 1715  piperacillin-tazobactam (ZOSYN) IVPB 3.375 g     3.375 g 100 mL/hr over 30 Minutes Intravenous  Once 02/13/17 1701 02/13/17 1824   02/13/17 1715  vancomycin (VANCOCIN) IVPB 1000 mg/200 mL premix  Status:  Discontinued     1,000 mg 200 mL/hr over 60 Minutes Intravenous  Once 02/13/17 1701 02/13/17 1705   02/13/17 1715  vancomycin (VANCOCIN) 1,500 mg in sodium chloride 0.9 % 500 mL IVPB     1,500 mg 250 mL/hr over 120 Minutes Intravenous  Once 02/13/17 1705 02/13/17 2300        Subjective:   Shatonia Hoots was seen and examined today.  Denies any specific complaints, sitting up in the chair. Patient denies dizziness, abdominal pain, N/V/D/C, new weakness, numbess, tingling.. No acute events overnight.    Objective:   Vitals:   03/16/17 0700 03/16/17 1316 03/16/17 2100 03/17/17 0500  BP: 124/90 116/81 116/76 103/76  Pulse: 94 100 84 83  Resp: 16 15 17 17   Temp: (!) 97.3 F  (36.3 C) 97.9 F (36.6 C) 97.6 F (36.4 C) 98.6 F (37 C)  TempSrc: Oral Oral Oral Oral  SpO2: 100% 100% 100% 99%  Weight: 76.2 kg (168 lb)   76.2 kg (167 lb 15.9 oz)  Height:        Intake/Output Summary (Last 24 hours) at 03/17/2017 1247 Last data filed at 03/17/2017 1100 Gross per 24 hour  Intake 403 ml  Output -  Net 403 ml     Wt Readings from Last 3 Encounters:  03/17/17 76.2 kg (167 lb 15.9 oz)  02/10/17 73.9 kg (163 lb)  06/13/16 73 kg (161 lb)     Exam Physical Exam General: Alert and oriented x 3, NAD Eyes:  HEENT:   Cardiovascular: S1 S2 clear, RRR No pedal edema b/l Respiratory: CTAB Gastrointestinal: Soft, nontender, nondistended, + bowel sounds Ext: no pedal edema bilaterally Neuro: no new deficit Musculoskeletal: No digital cyanosis, clubbing Skin: No rashes Psych: Normal affect and demeanor, alert and oriented x3      Data Reviewed:  I have personally reviewed following labs and imaging studies  Micro Results No results found for this or any previous visit (from the past 240 hour(s)).  Radiology Reports Dg Shoulder Right  Result Date: 03/14/2017 CLINICAL DATA:  Anterior and posterior RIGHT shoulder pain, septic RIGHT sternoclavicular joint since 02/13/2017, smoker EXAM: RIGHT SHOULDER - 2+ VIEW COMPARISON:  RIGHT shoulder MRI 02/27/2017 FINDINGS: Osseous mineralization normal. AC joint alignment normal. No glenohumeral fracture, dislocation, or bone destruction. Visualized ribs intact. RIGHT arm PICC line noted. IMPRESSION: No acute osseous abnormalities. Electronically Signed   By: Ulyses Southward M.D.   On: 03/14/2017 10:23   Ct Chest W Contrast  Result Date: 02/20/2017 CLINICAL DATA:  Acute onset of generalized chest pain. Assess for sternoclavicular septic arthritis. EXAM: CT CHEST WITH CONTRAST TECHNIQUE: Multidetector CT imaging of the chest was performed during intravenous contrast administration. CONTRAST:  75 mL ISOVUE-300 IOPAMIDOL (ISOVUE-300)  INJECTION 61% COMPARISON:  MRI of the thoracic spine performed 02/17/2017 FINDINGS: Cardiovascular: The heart is borderline  normal in size. The thoracic aorta is grossly unremarkable. A retroesophageal right subclavian artery is incidentally noted. The great vessels are unremarkable in appearance. Mediastinum/Nodes: Enlarged mediastinal nodes are noted at the subcarinal and azygoesophageal regions, aortopulmonary window, right paratracheal region and periaortic region, measuring up to 1.4 cm in short axis. Mildly prominent bilateral hilar nodes measure up to the 1.2 cm in short axis. No pericardial effusion is identified. The visualized portions of the thyroid gland are unremarkable. No axillary lymphadenopathy is appreciated. Lungs/Pleura: There is patchy airspace opacification involving the lower lobes bilaterally. Scattered nodular opacities are seen throughout both lungs. A small cavitary nodule is suggested at the right lung apex. This may reflect diffuse infection, possibly atypical in nature. Underlying metastatic disease cannot be excluded. Trace bilateral pleural fluid is noted.  No pneumothorax is seen. Upper Abdomen: The visualized portions of the liver are unremarkable. The spleen is enlarged, measuring 14.6 cm in length. The patient is status post cholecystectomy, with clips noted at the gallbladder fossa. The visualized portions of the pancreas and adrenal glands are within normal limits. Musculoskeletal: There is asymmetric prominence of soft tissue density tracking about the right sternoclavicular joint, with mild underlying soft tissue inflammation. Given clinical concern, this could reflect right sternoclavicular joint septic arthritis. IMPRESSION: 1. Patchy airspace opacification involving the lower lung lobes bilaterally. Scattered nodular opacities throughout both lungs. Small cavitary nodule suggested at the right lung apex. Findings are concerning for diffuse infection, possibly atypical in  nature. Underlying metastatic disease cannot be excluded. Would correlate clinically, and perform follow-up CT of the chest 3-4 weeks after completion of treatment for pneumonia. 2. Trace bilateral pleural fluid noted. 3. Asymmetric prominence of soft tissue density tracking about the right sternoclavicular joint, with mild underlying soft tissue inflammation. Given clinical concern, this could reflect right-sided clavicular joint septic arthritis. 4. Diffusely prominent mediastinal and bilateral hilar nodes noted. This may reflect the underlying infection. 5. Retroesophageal right subclavian artery incidentally noted. Electronically Signed   By: Roanna Raider M.D.   On: 02/20/2017 01:40   Ct Angio Chest Pe W Or Wo Contrast  Result Date: 02/25/2017 CLINICAL DATA:  Acute onset of pleuritic chest pain and shortness of breath. Elevated D-dimer. EXAM: CT ANGIOGRAPHY CHEST WITH CONTRAST TECHNIQUE: Multidetector CT imaging of the chest was performed using the standard protocol during bolus administration of intravenous contrast. Multiplanar CT image reconstructions and MIPs were obtained to evaluate the vascular anatomy. CONTRAST:  ISOVUE-370 IOPAMIDOL (ISOVUE-370) INJECTION 76% COMPARISON:  Chest radiograph performed 02/23/2016, and CT of the chest performed 02/20/2017 FINDINGS: Cardiovascular:  There is no evidence of pulmonary embolus. The heart is borderline enlarged. The thoracic aorta is grossly unremarkable in appearance. The great vessels are within normal limits. Mediastinum/Nodes: A 1.5 cm azygoesophageal recess node is noted. Visualized hilar nodes are borderline normal in size. No pericardial effusion is identified. Incidental note is made of a retroesophageal right subclavian artery. The visualized portions of the thyroid gland are unremarkable. No axillary lymphadenopathy is appreciated. Lungs/Pleura: Patchy bilateral airspace opacities raise concern for pneumonia, most prominent at the lower  lobes bilaterally. Scattered peripheral nodular opacities are noted throughout both lungs. This is slightly improved from the prior study. There is a 2.9 cm cavitary focus at the right lung base, concerning for atypical infection. No pleural effusion or pneumothorax is seen. Upper Abdomen: The visualized portions of the liver and spleen are unremarkable. Musculoskeletal: No acute osseous abnormalities are identified. The visualized musculature is unremarkable in appearance. Review of  the MIP images confirms the above findings. IMPRESSION: 1. No evidence of pulmonary embolus. 2. As on the recent prior CT, bilateral pneumonia is again noted. Peripheral nodular opacities are seen throughout both lungs, slightly improved from the prior study. 2.9 cm cavitary focus at the right lung base is better characterized, and is concerning for atypical infection. 3. Enlarged 1.5 cm azygoesophageal recess node. Electronically Signed   By: Roanna Raider M.D.   On: 02/25/2017 04:52   Mr Laqueta Jean ZO Contrast  Result Date: 02/22/2017 CLINICAL DATA:  Weakness. Endocarditis, infective suspected. Fever and chills. EXAM: MRI HEAD WITHOUT AND WITH CONTRAST TECHNIQUE: Multiplanar, multiecho pulse sequences of the brain and surrounding structures were obtained without and with intravenous contrast. CONTRAST:  15mL MULTIHANCE GADOBENATE DIMEGLUMINE 529 MG/ML IV SOLN COMPARISON:  None. FINDINGS: Brain: No acute infarct, hemorrhage, or mass lesion is present. No focal white matter lesions are present. The postcontrast images demonstrate no pathologic enhancement. Is the internal auditory canals are within normal limits bilaterally. The brainstem and cerebellum are normal. No significant extra-axial fluid collection is present. Vascular: Flow is present in the major intracranial arteries. Skull and upper cervical spine: Skull base is within normal limits. The upper cervical spine is normal. The craniocervical junction is normal. Decreased T1  signal is compatible with known anemia. Sinuses/Orbits: The paranasal sinuses and mastoid air cells are clear. Globes and orbits are within normal limits. IMPRESSION: Negative MRI the brain. No acute or focal lesion to explain the patient's weakness. No evidence for infection. Electronically Signed   By: Marin Roberts M.D.   On: 02/22/2017 20:42   Mr Cervical Spine Wo Contrast  Result Date: 02/17/2017 CLINICAL DATA:  Lower extremity weakness. Bacteremia, fever, leukocytosis. History of intravenous drug abuse. EXAM: MRI CERVICAL AND THORACIC SPINE WITHOUT CONTRAST TECHNIQUE: Multiplanar and multiecho pulse sequences of the cervical spine, to include the craniocervical junction and cervicothoracic junction, and thoracic spine, were obtained without intravenous contrast. Intravenous access not successfully gained, no contrast administered. COMPARISON:  MRI of the lumbar spine February 14, 2017 FINDINGS: MRI CERVICAL SPINE FINDINGS-moderately motion degraded examination. ALIGNMENT: Straightened cervical lordosis.  No malalignment. VERTEBRAE/DISCS: Vertebral bodies are intact. Intervertebral disc morphology's and signal are normal. CORD:Cervical spinal cord is normal morphology and signal characteristics from the cervicomedullary junction to level of T1-2, the most caudal well visualized level. POSTERIOR FOSSA, VERTEBRAL ARTERIES, PARASPINAL TISSUES: No MR findings of ligamentous injury. Vertebral artery flow voids present. Cerebellar tonsils descend slightly below the foramen magnum though, are not pointed in appearance and, do not reach criteria fecal ERA 1 malformation. DISC LEVELS: No disc bulge, canal stenosis nor neural foraminal narrowing. MRI THORACIC  SPINE FINDINGS-Mild motion degraded examination. ALIGNMENT: Maintenance of the thoracic kyphosis. No malalignment. VERTEBRAE/DISCS: Vertebral bodies are intact. Intervertebral discs morphology and signal are normal.12 mm T4 hemangioma. No suspicious or  acute bone marrow signal. CORD: Thoracic spinal cord is normal morphology and signal characteristics to the level of the conus medullaris which terminates at T12-L1. No epidural collection by noncontrast imaging. PREVERTEBRAL AND PARASPINAL SOFT TISSUES: Small RIGHT pleural effusion. No paraspinal fluid collection by noncontrast MRI. DISC LEVELS: No disc bulge, canal stenosis nor neural foraminal narrowing. IMPRESSION: 1. Negative moderately motion degraded noncontrast MRI of the cervical spine. 2. Negative mildly motion degraded noncontrast MRI of the thoracic spine. 3. Small RIGHT pleural effusion. Electronically Signed   By: Awilda Metro M.D.   On: 02/17/2017 04:44   Mr Thoracic Spine Wo Contrast  Result Date: 02/17/2017  CLINICAL DATA:  Lower extremity weakness. Bacteremia, fever, leukocytosis. History of intravenous drug abuse. EXAM: MRI CERVICAL AND THORACIC SPINE WITHOUT CONTRAST TECHNIQUE: Multiplanar and multiecho pulse sequences of the cervical spine, to include the craniocervical junction and cervicothoracic junction, and thoracic spine, were obtained without intravenous contrast. Intravenous access not successfully gained, no contrast administered. COMPARISON:  MRI of the lumbar spine February 14, 2017 FINDINGS: MRI CERVICAL SPINE FINDINGS-moderately motion degraded examination. ALIGNMENT: Straightened cervical lordosis.  No malalignment. VERTEBRAE/DISCS: Vertebral bodies are intact. Intervertebral disc morphology's and signal are normal. CORD:Cervical spinal cord is normal morphology and signal characteristics from the cervicomedullary junction to level of T1-2, the most caudal well visualized level. POSTERIOR FOSSA, VERTEBRAL ARTERIES, PARASPINAL TISSUES: No MR findings of ligamentous injury. Vertebral artery flow voids present. Cerebellar tonsils descend slightly below the foramen magnum though, are not pointed in appearance and, do not reach criteria fecal ERA 1 malformation. DISC LEVELS: No  disc bulge, canal stenosis nor neural foraminal narrowing. MRI THORACIC  SPINE FINDINGS-Mild motion degraded examination. ALIGNMENT: Maintenance of the thoracic kyphosis. No malalignment. VERTEBRAE/DISCS: Vertebral bodies are intact. Intervertebral discs morphology and signal are normal.12 mm T4 hemangioma. No suspicious or acute bone marrow signal. CORD: Thoracic spinal cord is normal morphology and signal characteristics to the level of the conus medullaris which terminates at T12-L1. No epidural collection by noncontrast imaging. PREVERTEBRAL AND PARASPINAL SOFT TISSUES: Small RIGHT pleural effusion. No paraspinal fluid collection by noncontrast MRI. DISC LEVELS: No disc bulge, canal stenosis nor neural foraminal narrowing. IMPRESSION: 1. Negative moderately motion degraded noncontrast MRI of the cervical spine. 2. Negative mildly motion degraded noncontrast MRI of the thoracic spine. 3. Small RIGHT pleural effusion. Electronically Signed   By: Awilda Metro M.D.   On: 02/17/2017 04:44   Mr Sternum Wo Contrast  Result Date: 02/27/2017 CLINICAL DATA:  Right sternoclavicular septic arthritis. Pain with active motion. Weakness. EXAM: MRI sternum without contrast TECHNIQUE: Multiplanar, multiecho pulse sequences of the sternum without intravenous contrast. CONTRAST:  None COMPARISON:  02/25/2017 CT chest FINDINGS: Patient could not fully tolerate the exam. Coronal T2 fat saturated and postcontrast imaging was not possible. Trace intra-articular and periarticular edema about the right sternoclavicular joint is noted. Reactive marrow changes are seen about the joint. Findings are compatible with history of septic arthritis. No significant drainable fluid collections are noted. No soft tissue mass is seen. IMPRESSION: Reactive marrow changes about the right sternoclavicular joint with trace intra-articular and periarticular fluid/edema. Findings are in keeping with history of septic arthritis. The contralateral  sternoclavicular joint is unremarkable. No marrow signal changes to suggest acute osteomyelitis. Electronically Signed   By: Tollie Eth M.D.   On: 02/27/2017 22:08   Mr Shoulder Right W Wo Contrast  Result Date: 02/27/2017 CLINICAL DATA:  32 year old female with right sternoclavicular septic arthritis EXAM: MRI OF THE RIGHT SHOULDER WITHOUT AND WITH CONTRAST TECHNIQUE: Multiplanar, multisequence MR imaging of the right shoulder was performed before and after the administration of intravenous contrast. CONTRAST:  15mL MULTIHANCE GADOBENATE DIMEGLUMINE 529 MG/ML IV SOLN COMPARISON:  None. FINDINGS: Rotator cuff: Intact supraspinatus, infraspinatus, teres minor and subscapularis tendons. Muscles: No atrophy or abnormal signal of the muscles of the rotator cuff. Biceps long head:  Intact. Acromioclavicular Joint: Normal acromioclavicular joint. Type II curved acromion. Trace subacromial and subdeltoid bursal fluid and/or edema. Glenohumeral Joint: No joint effusion. No chondral defect. Labrum:  Intact. Bones: Small subcortical degenerative cystic change along the posterolateral aspect of the humeral head. Marrow edema or fracture. No  joint dislocation. Other: None. IMPRESSION: 1. Minimal subacromial bursitis. 2. No rotator cuff tear, marrow signal abnormality, fracture or joint dislocation. 3. Small focus of subcortical degenerate cystic change along the posterior aspect of the humeral head. Electronically Signed   By: Tollie Eth M.D.   On: 02/27/2017 22:04   Dg Chest Port 1 View  Result Date: 02/22/2017 CLINICAL DATA:  Chest pain EXAM: PORTABLE CHEST 1 VIEW COMPARISON:  February 13, 2017 chest radiograph and chest CT February 20, 2017 FINDINGS: There is patchy airspace consolidation in the lung bases consistent with pneumonia. Lungs elsewhere are clear. Note that patchy nodular opacity seen on recent CT in the upper lobes are not appreciable by radiography. Heart is borderline enlarged with pulmonary vascularity  within normal limits. No adenopathy appreciable by radiography. No bone lesions. IMPRESSION: Bibasilar airspace consolidation consistent with pneumonia. Nodular opacity seen in the upper lobe regions on recent CT are not appreciable by radiography. There is mild cardiomegaly. No adenopathy is appreciable by radiography. Electronically Signed   By: Bretta Bang III M.D.   On: 02/22/2017 21:49   US Pelvic Complete With Transvaginal  Result Date: 02/15/2017 CLINICAL DATA:  Pelvic pain.  Pain for 1 week. EXAM: TRANSABDOMINAL AND TRANSVAGINAL ULTRASOUND OF PELVIS TECHNIQUE: Both transabdominal and transvaginal ultrasound examinations of the pelvis were performed. Transabdominal technique was performed for global imaging of the pelvis including uterus, ovaries, adnexal regions, and pelvic cul-de-sac. It was necessary to proceed with endovaginal exam following the transabdominal exam to visualize the endometrium and ovaries. COMPARISON:  None FINDINGS: Uterus Measurements: 8.9 x 4.2 x 5.1 cm. No fibroids or other mass visualized. Endometrium Thickness: 5 mm.  No focal abnormality visualized. Right ovary Measurements: 2.2 x 2.2 x 1.5 cm. Normal appearance/no adnexal mass. Left ovary Not visualized. Other findings No abnormal free fluid. IMPRESSION: 1. Nonvisualized left ovary. 2. Otherwise normal pelvic ultrasound. Electronically Signed   By: Elige Ko   On: 02/15/2017 16:25    Lab Data:  CBC: Recent Labs  Lab 03/12/17 0330 03/15/17 0426 03/17/17 0412  WBC 4.4 3.8* 2.5*  HGB 8.2* 8.7* 9.1*  HCT 25.0* 27.1* 28.5*  MCV 90.6 90.9 91.3  PLT 271 308 354   Basic Metabolic Panel: Recent Labs  Lab 03/12/17 0330 03/12/17 1511 03/15/17 0426 03/17/17 0412  NA 139  --  139 138  K 3.4*  --  4.0 3.9  CL 104  --  105 109  CO2 25  --  25 22  GLUCOSE 98  --  103* 106*  BUN 9  --  16 16  CREATININE 0.81  --  0.81 0.78  CALCIUM 8.5*  --  8.7* 8.7*  MG  --  1.8  --   --    GFR: Estimated Creatinine  Clearance: 107.4 mL/min (by C-G formula based on SCr of 0.78 mg/dL). Liver Function Tests: Recent Labs  Lab 03/12/17 1511  ALBUMIN 2.3*   No results for input(s): LIPASE, AMYLASE in the last 168 hours. No results for input(s): AMMONIA in the last 168 hours. Coagulation Profile: No results for input(s): INR, PROTIME in the last 168 hours. Cardiac Enzymes: No results for input(s): CKTOTAL, CKMB, CKMBINDEX, TROPONINI in the last 168 hours. BNP (last 3 results) No results for input(s): PROBNP in the last 8760 hours. HbA1C: No results for input(s): HGBA1C in the last 72 hours. CBG: No results for input(s): GLUCAP in the last 168 hours. Lipid Profile: No results for input(s): CHOL, HDL, LDLCALC, TRIG, CHOLHDL, LDLDIRECT  in the last 72 hours. Thyroid Function Tests: No results for input(s): TSH, T4TOTAL, FREET4, T3FREE, THYROIDAB in the last 72 hours. Anemia Panel: No results for input(s): VITAMINB12, FOLATE, FERRITIN, TIBC, IRON, RETICCTPCT in the last 72 hours. Urine analysis:    Component Value Date/Time   COLORURINE AMBER (A) 02/13/2017 0436   APPEARANCEUR HAZY (A) 02/13/2017 0436   LABSPEC 1.014 02/13/2017 0436   PHURINE 5.0 02/13/2017 0436   GLUCOSEU NEGATIVE 02/13/2017 0436   HGBUR LARGE (A) 02/13/2017 0436   BILIRUBINUR NEGATIVE 02/13/2017 0436   KETONESUR NEGATIVE 02/13/2017 0436   PROTEINUR NEGATIVE 02/13/2017 0436   UROBILINOGEN 0.2 12/07/2012 1510   NITRITE POSITIVE (A) 02/13/2017 0436   LEUKOCYTESUR TRACE (A) 02/13/2017 0436     Vincy Feliz M.D. [facility] 03/17/2017, 12:47 PM  Pager: [phone] Between 7am to 7pm - call Pager - [phone]  After 7pm go to [email or web address] - password [email or web address]  Call night coverage person covering after 7pm

## 2017-03-17 NOTE — Progress Notes (Signed)
Physical Therapy Treatment Patient Details Name: Tammy ArabCasey M [name] MRN: [id number] DOB: 12/13/1985 Today's Date: 03/17/2017    History of Present Illness Pt is a 32 y.o. female, with past medical history significant for fibromyalgia, anemia and history of IV drug abuse. She presented to the ED with a few days history of fever, chills, and generalized weakness muscle aches in addition to arthralgias.  She was admitted with diagnosis of sepsis.  Pt being treated for endocarditis.     PT Comments    Pt mobilizing frequently in hallway but limited in pace due to IV pole. Worked today on increasing pace as well as bkwd walking, side stepping, high knee walking. Pt mildly unsteady with these activities as well as hesitant. Worked on strengthening and balance activities in her room that she can continues to work on her own in between therapy days. PT will continue to follow.    Follow Up Recommendations  No PT follow up     Equipment Recommendations  None recommended by PT    Recommendations for Other Services Rehab consult     Precautions / Restrictions Precautions Precautions: Fall Restrictions Weight Bearing Restrictions: No    Mobility  Bed Mobility               General bed mobility comments: pt in recliner chair  Transfers Overall transfer level: Modified independent Equipment used: None Transfers: Sit to/from Stand Sit to Stand: Modified independent (Device/Increase time)         General transfer comment: pt safe with sit to stand and manages own IV pole  Ambulation/Gait Ambulation/Gait assistance: Supervision Ambulation Distance (Feet): 1500 Feet Assistive device: None Gait Pattern/deviations: Step-to pattern;Decreased stride length Gait velocity: WFL with cues Gait velocity interpretation: at or above normal speed for age/gender General Gait Details: worked on increasing pace of gait today and requested of nursing that they detach her from IV pole each day to  let her take a walk without pole where she can really pick up her pace. Also worked on changing pace of walking, bkwds walking (which was difficult for her), and slow walking with highy knees   Stairs            Wheelchair Mobility    Modified Rankin (Stroke Patients Only)       Balance Overall balance assessment: Mild deficits observed, not formally tested Sitting-balance support: No upper extremity supported;Feet supported Sitting balance-Leahy Scale: Normal     Standing balance support: During functional activity;No upper extremity supported Standing balance-Leahy Scale: Good Standing balance comment: mild balance deficits apparent with bkwd walking, side stepping, modified skipping, and SL stance.                             Cognition Arousal/Alertness: Awake/alert Behavior During Therapy: WFL for tasks assessed/performed Overall Cognitive Status: Within Functional Limits for tasks assessed                                        Exercises Other Exercises Other Exercises: SL stance 1 min each side Other Exercises: squats 15x Other Exercises: squat with mini jump 5x Other Exercises: bkwd walking in room 4 reps Other Exercises: lunges 5 each side    General Comments General comments (skin integrity, edema, etc.): Gave pt activities to perform in room until next session in addition to bout of walking each  day without IV pole. They are:  bkwd walking, SL stance while doing activities like brushing teeth/ hair, squats and squat with mini jump, and lunges at foot of bed      Pertinent Vitals/Pain Pain Assessment: No/denies pain    Home Living                      Prior Function            PT Goals (current goals can now be found in the care plan section) Acute Rehab PT Goals Patient Stated Goal: get better PT Goal Formulation: With patient Time For Goal Achievement: 03/31/17 Potential to Achieve Goals: Good Progress  towards PT goals: Progressing toward goals    Frequency    Min 1X/week      PT Plan Current plan remains appropriate    Co-evaluation              AM-PAC PT "6 Clicks" Daily Activity  Outcome Measure  Difficulty turning over in bed (including adjusting bedclothes, sheets and blankets)?: None Difficulty moving from lying on back to sitting on the side of the bed? : None Difficulty sitting down on and standing up from a chair with arms (e.g., wheelchair, bedside commode, etc,.)?: None Help needed moving to and from a bed to chair (including a wheelchair)?: None Help needed walking in hospital room?: None Help needed climbing 3-5 steps with a railing? : A Little 6 Click Score: 23    End of Session Equipment Utilized During Treatment: Gait belt Activity Tolerance: Patient tolerated treatment well Patient left: in chair;with call bell/phone within reach Nurse Communication: Mobility status PT Visit Diagnosis: Muscle weakness (generalized) (M62.81);Difficulty in walking, not elsewhere classified (R26.2)     Time: 1210-1228 PT Time Calculation (min) (ACUTE ONLY): 18 min  Charges:  $Therapeutic Exercise: 8-22 mins                    G Codes:       Lyanne Co, PT  Acute Rehab Services  [phone]    [place] Sylvia Helms 03/17/2017, 1:03 PM

## 2017-03-17 NOTE — Progress Notes (Signed)
Nutrition Follow-up  DOCUMENTATION CODES:   Not applicable  INTERVENTION:  -Monitor for needs  NUTRITION DIAGNOSIS:   Inadequate oral intake related to lethargy/confusion, poor appetite as evidenced by meal completion < 50%. -resolved  GOAL:   Patient will meet greater than or equal to 90% of their needs -met  MONITOR:   PO intake, Supplement acceptance, Labs, Weight trends, Skin, I & O's  ASSESSMENT:   32 y.o. female w/ a hx of fibromyalgia, anemia, and IV drug abuse who presented with a few days history of fever, chills, generalized weakness muscle aches, and arthralgias.  In the emergency room the patient was noted to have fever, leukocytosis and left hand and bilateral lower extremity redness and petechiae.    Continues to eat well and receive food from outside. Avg meal completion 75% Weight was up last week due to edema but received a couple doses of lasix and is now back down to 167 pounds. Continues on IV ABx, therapy to end 04/15/2017.  Labs reviewed Medications reviewed and include:  Iron, MVI w/ Minerals, Miralax, Senokot-S IV Nafcillin  Diet Order:  Diet Heart Room service appropriate? Yes; Fluid consistency: Thin  EDUCATION NEEDS:   Education needs have been addressed  Skin:  Skin Assessment: Reviewed RN Assessment  Last BM:  03/16/2017  Height:   Ht Readings from Last 1 Encounters:  03/11/17 _0  (1.702 m)    Weight:   Wt Readings from Last 1 Encounters:  03/17/17 167 lb 15.9 oz (76.2 kg)    Ideal Body Weight:  61.4 kg  BMI:  Body mass index is 26.31 kg/m.  Estimated Nutritional Needs:   Kcal:  1700-1900  Protein:  90-105 grams  Fluid:  1.7-1.9 L  Satira Anis. Evert Wenrich, MS, RD LDN Inpatient Clinical Dietitian Pager [phone]

## 2017-03-18 LAB — CBC
HEMATOCRIT: 28.1 % — AB (ref 36.0–46.0)
Hemoglobin: 9.1 g/dL — ABNORMAL LOW (ref 12.0–15.0)
MCH: 29.4 pg (ref 26.0–34.0)
MCHC: 32.4 g/dL (ref 30.0–36.0)
MCV: 90.9 fL (ref 78.0–100.0)
PLATELETS: 340 10*3/uL (ref 150–400)
RBC: 3.09 MIL/uL — AB (ref 3.87–5.11)
RDW: 19.4 % — AB (ref 11.5–15.5)
WBC: 1.8 10*3/uL — ABNORMAL LOW (ref 4.0–10.5)

## 2017-03-18 LAB — BASIC METABOLIC PANEL
ANION GAP: 9 (ref 5–15)
BUN: 17 mg/dL (ref 6–20)
CALCIUM: 8.7 mg/dL — AB (ref 8.9–10.3)
CO2: 23 mmol/L (ref 22–32)
Chloride: 107 mmol/L (ref 101–111)
Creatinine, Ser: 0.77 mg/dL (ref 0.44–1.00)
Glucose, Bld: 98 mg/dL (ref 65–99)
POTASSIUM: 3.9 mmol/L (ref 3.5–5.1)
Sodium: 139 mmol/L (ref 135–145)

## 2017-03-18 MED ORDER — OXYCODONE-ACETAMINOPHEN 5-325 MG PO TABS
1.0000 | ORAL_TABLET | Freq: Four times a day (QID) | ORAL | Status: DC | PRN
  Administered 2017-03-18 – 2017-03-19 (×4): 1 via ORAL
  Filled 2017-03-18 (×4): qty 1

## 2017-03-18 NOTE — Progress Notes (Signed)
[facility]                                                                              Patient Demographics  [name], is a 32 y.o. female, DOB - Sep 20, 1985, RUE:[id number]  Admit date - 02/13/2017   Admitting Physician Carron Curie, MD  Outpatient Primary MD for the patient is Pearson Grippe, MD  Outpatient specialists:   LOS - 33  days   Medical records reviewed and are as summarized below:    Chief Complaint  Patient presents with  . Weakness       Brief summary   32 year old female with past medical history significant for chronic anemia and IV drug use. Patient presented to the emergency room on 02/13/2017 with complaints of fevers and chills and generalized weakness for 2 days prior to presentation. Patient was noted to have erythema and petechiae on her left hand and lower extremities and found to have MSSA bacteremia/sepsis. 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 Patient was not felt to be a candidate for valve replacement as per CT surgery due to her history of ongoing drug use. Patient was also found to have right sternoclavicular septic arthritis. Patient was seen and evaluated by infectious disease. Patient is currently on IV nafcillin, and patient with complete 6-8 weeks of IV antibiotics therapy (antibiotic therapy suspected to and on 04 /2/19). PICC line has been placed. Patient is not able to go home with PICC line given patient's illicit drug use habit. No facility is willing to accept patient. The plan is for the patient to remain in the hospital and complete the course of IV antibiotics.    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  -Right sternoclavicular septic  arthritis all secondary to IV drug use and septic Staphylococcus infection -Repeat blood cultures 2/6 has been negative so far. ID had concern regarding risk of embolization across the PFO to CNS or kidneys in the left sided circulation that could occur even if vegetation is sterile. Dr. Tyrone Sage does not recommend TVR in context of her ongoing drug use.  -Patient will complete 6-8 weeks of IV Nafcillin (this will be completed on 04/15/2017), will remain in the hospital until then -Dr Algis Liming recommended repeat 2-D echo and likely repeat TEE to assess for vegetations in next 4 weeks, CT of the clavicle by 3/15. 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  Septic arthritis of right sternoclavicular  joint (HCC)  -Complete course of antibiotics.  PFO (patent foramen ovale)  Tricuspid valve vegetation  Depression -On Cymbalta and Zyprexa  Chronic diastolic heart failure  -compensated, received Lasix  - BNP slightly elevated at 222  B/l LE edema -due to severe protein calorie malnutrition albumin 1.6.  - Received Lasix 40 mg 2 doses on 3/1  Code Status: full CODE STATUS DVT Prophylaxis:  Lovenox  Family Communication: Discussed in detail with the patient, all imaging results, lab results explained to the patient   Disposition Plan: to stay inpatient till completes antibiotics by 4/2 . No acute issues  Time Spent in minutes  15   minutes  Procedures:  2-D echo 2/1 showed grade 1 diastolic dysfunction, mild tricuspid regurgitation TEE 2/7 vegetation noted on tricuspid valve with large PFO  Consultants:    infectious disease Cardiology CT surgery  Antimicrobials:  IV Ancef 2/-2/8 Zyvox 2/5-2/6, 2/17-/18 IV nafcillin 2/8-present IV vancomycin and Zosyn 1/31-2/1      Medications  Scheduled Meds: . buprenorphine-naloxone  2 tablet Sublingual Daily  . DULoxetine  60 mg Oral BID  . enoxaparin (LOVENOX) injection  40 mg Subcutaneous Q24H  . ferrous sulfate  325 mg Oral TID WC  . gadobenate dimeglumine  15 mL Intravenous Once  . multivitamin with minerals  1 tablet Oral Daily  . OLANZapine  5 mg Oral q morning - 10a  . polyethylene glycol  17 g Oral Daily  . senna-docusate  1 tablet Oral BID  . sodium chloride flush  10-40 mL Intracatheter Q12H   Continuous Infusions: . nafcillin IV 2 g (03/18/17 1102)   PRN Meds:.acetaminophen, ALPRAZolam, cyclobenzaprine, magic mouthwash, ondansetron **OR** ondansetron (ZOFRAN) IV, sodium chloride flush, sodium chloride flush, zolpidem   Antibiotics   Anti-infectives (From admission, onward)   Start     Dose/Rate Route Frequency Ordered Stop   03/02/17 2200  linezolid (ZYVOX) tablet 600 mg  Status:  Discontinued     600 mg Oral Every 12 hours 03/02/17 2037 03/03/17 1203   02/24/17 1600  nafcillin 2 g in sodium chloride 0.9 % 100 mL IVPB     2 g 200 mL/hr over 30 Minutes Intravenous Every 4 hours 02/24/17 0833     02/21/17 2000  nafcillin 2 g in dextrose 5 % 100 mL IVPB  Status:  Discontinued     2 g 200 mL/hr over 30 Minutes Intravenous Every 4 hours 02/21/17 1741 02/24/17 0910   02/21/17 1730  nafcillin injection 2 g  Status:  Discontinued     2 g Intravenous Every 4 hours 02/21/17 1726 02/21/17 1740   02/19/17 1230  ceFAZolin (ANCEF) IVPB 2g/100 mL premix  Status:  Discontinued     2 g 200 mL/hr over 30 Minutes Intravenous Every 8 hours 02/19/17 1148  02/21/17 1726   02/18/17 1330  linezolid (ZYVOX) tablet 600 mg  Status:  Discontinued     600 mg Oral Every 12 hours 02/18/17 1250 02/19/17 1126   02/17/17 0800  sulfamethoxazole-trimethoprim (BACTRIM DS,SEPTRA DS) 800-160 MG per tablet 2 tablet  Status:  Discontinued     2 tablet Oral Every 12 hours 02/16/17 1453 02/18/17 1250   02/16/17 1600  sulfamethoxazole-trimethoprim (BACTRIM DS,SEPTRA DS) 800-160 MG per tablet 2 tablet     2 tablet Oral  Once 02/16/17 1509 02/16/17 1657   02/14/17 1400  ceFAZolin (ANCEF) IVPB 2g/100 mL premix  Status:  Discontinued     2 g 200 mL/hr over 30 Minutes Intravenous Every 8 hours  02/14/17 1013 02/16/17 1453   02/14/17 0200  vancomycin (VANCOCIN) IVPB 750 mg/150 ml premix  Status:  Discontinued     750 mg 150 mL/hr over 60 Minutes Intravenous Every 8 hours 02/13/17 1739 02/14/17 1017   02/14/17 0000  piperacillin-tazobactam (ZOSYN) IVPB 3.375 g  Status:  Discontinued     3.375 g 12.5 mL/hr over 240 Minutes Intravenous Every 8 hours 02/13/17 1739 02/14/17 1013   02/13/17 1715  piperacillin-tazobactam (ZOSYN) IVPB 3.375 g     3.375 g 100 mL/hr over 30 Minutes Intravenous  Once 02/13/17 1701 02/13/17 1824   02/13/17 1715  vancomycin (VANCOCIN) IVPB 1000 mg/200 mL premix  Status:  Discontinued     1,000 mg 200 mL/hr over 60 Minutes Intravenous  Once 02/13/17 1701 02/13/17 1705   02/13/17 1715  vancomycin (VANCOCIN) 1,500 mg in sodium chloride 0.9 % 500 mL IVPB     1,500 mg 250 mL/hr over 120 Minutes Intravenous  Once 02/13/17 1705 02/13/17 2300        Subjective:   Rosalyn Archambault was seen and examined today.  No acute issues, and bleeding the hallways without any difficulty.  Patient denies dizziness, abdominal pain, N/V/D/C, new weakness, numbess, tingling. No fevers  Objective:   Vitals:   03/17/17 1318 03/17/17 2059 03/18/17 0407 03/18/17 0457  BP: 115/81 122/86  (!) 106/99  Pulse: 92 91  (!) 101  Resp: 16 20  18   Temp: 97.9 F (36.6 C) 98.1  F (36.7 C)  98.7 F (37.1 C)  TempSrc: Oral Oral  Oral  SpO2: 98% 99%  98%  Weight:   78 kg (171 lb 14.4 oz)   Height:        Intake/Output Summary (Last 24 hours) at 03/18/2017 1240 Last data filed at 03/18/2017 0650 Gross per 24 hour  Intake 950 ml  Output 3 ml  Net 947 ml     Wt Readings from Last 3 Encounters:  03/18/17 78 kg (171 lb 14.4 oz)  02/10/17 73.9 kg (163 lb)  06/13/16 73 kg (161 lb)     Exam   General: Alert and oriented x 3, NAD  Eyes:   HEENT:   Cardiovascular: S1 S2 clear, RRR, no pedal edema  Respiratory: CTA B  Gastrointestinal: Soft, NT, ND, NBS  Ext: no pedal edema bilaterally  Neuro: ambulating without any difficulty  Musculoskeletal: No digital cyanosis, clubbing  Skin: No rashes  Psych: Normal affect and demeanor, alert and oriented x3    Data Reviewed:  I have personally reviewed following labs and imaging studies  Micro Results No results found for this or any previous visit (from the past 240 hour(s)).  Radiology Reports Dg Shoulder Right  Result Date: 03/14/2017 CLINICAL DATA:  Anterior and posterior RIGHT shoulder pain, septic RIGHT sternoclavicular joint since 02/13/2017, smoker EXAM: RIGHT SHOULDER - 2+ VIEW COMPARISON:  RIGHT shoulder MRI 02/27/2017 FINDINGS: Osseous mineralization normal. AC joint alignment normal. No glenohumeral fracture, dislocation, or bone destruction. Visualized ribs intact. RIGHT arm PICC line noted. IMPRESSION: No acute osseous abnormalities. Electronically Signed   By: Ulyses Southward M.D.   On: 03/14/2017 10:23   Ct Chest W Contrast  Result Date: 02/20/2017 CLINICAL DATA:  Acute onset of generalized chest pain. Assess for sternoclavicular septic arthritis. EXAM: CT CHEST WITH CONTRAST TECHNIQUE: Multidetector CT imaging of the chest was performed during intravenous contrast administration. CONTRAST:  75 mL ISOVUE-300 IOPAMIDOL (ISOVUE-300) INJECTION 61% COMPARISON:  MRI of the thoracic spine performed  02/17/2017 FINDINGS: Cardiovascular: The  heart is borderline normal in size. The thoracic aorta is grossly unremarkable. A retroesophageal right subclavian artery is incidentally noted. The great vessels are unremarkable in appearance. Mediastinum/Nodes: Enlarged mediastinal nodes are noted at the subcarinal and azygoesophageal regions, aortopulmonary window, right paratracheal region and periaortic region, measuring up to 1.4 cm in short axis. Mildly prominent bilateral hilar nodes measure up to the 1.2 cm in short axis. No pericardial effusion is identified. The visualized portions of the thyroid gland are unremarkable. No axillary lymphadenopathy is appreciated. Lungs/Pleura: There is patchy airspace opacification involving the lower lobes bilaterally. Scattered nodular opacities are seen throughout both lungs. A small cavitary nodule is suggested at the right lung apex. This may reflect diffuse infection, possibly atypical in nature. Underlying metastatic disease cannot be excluded. Trace bilateral pleural fluid is noted.  No pneumothorax is seen. Upper Abdomen: The visualized portions of the liver are unremarkable. The spleen is enlarged, measuring 14.6 cm in length. The patient is status post cholecystectomy, with clips noted at the gallbladder fossa. The visualized portions of the pancreas and adrenal glands are within normal limits. Musculoskeletal: There is asymmetric prominence of soft tissue density tracking about the right sternoclavicular joint, with mild underlying soft tissue inflammation. Given clinical concern, this could reflect right sternoclavicular joint septic arthritis. IMPRESSION: 1. Patchy airspace opacification involving the lower lung lobes bilaterally. Scattered nodular opacities throughout both lungs. Small cavitary nodule suggested at the right lung apex. Findings are concerning for diffuse infection, possibly atypical in nature. Underlying metastatic disease cannot be excluded. Would  correlate clinically, and perform follow-up CT of the chest 3-4 weeks after completion of treatment for pneumonia. 2. Trace bilateral pleural fluid noted. 3. Asymmetric prominence of soft tissue density tracking about the right sternoclavicular joint, with mild underlying soft tissue inflammation. Given clinical concern, this could reflect right-sided clavicular joint septic arthritis. 4. Diffusely prominent mediastinal and bilateral hilar nodes noted. This may reflect the underlying infection. 5. Retroesophageal right subclavian artery incidentally noted. Electronically Signed   By: Roanna RaiderJeffery  Chang M.D.   On: 02/20/2017 01:40   Ct Angio Chest Pe W Or Wo Contrast  Result Date: 02/25/2017 CLINICAL DATA:  Acute onset of pleuritic chest pain and shortness of breath. Elevated D-dimer. EXAM: CT ANGIOGRAPHY CHEST WITH CONTRAST TECHNIQUE: Multidetector CT imaging of the chest was performed using the standard protocol during bolus administration of intravenous contrast. Multiplanar CT image reconstructions and MIPs were obtained to evaluate the vascular anatomy. CONTRAST:  100mL ISOVUE-370 IOPAMIDOL (ISOVUE-370) INJECTION 76% COMPARISON:  Chest radiograph performed 02/23/2016, and CT of the chest performed 02/20/2017 FINDINGS: Cardiovascular:  There is no evidence of pulmonary embolus. The heart is borderline enlarged. The thoracic aorta is grossly unremarkable in appearance. The great vessels are within normal limits. Mediastinum/Nodes: A 1.5 cm azygoesophageal recess node is noted. Visualized hilar nodes are borderline normal in size. No pericardial effusion is identified. Incidental note is made of a retroesophageal right subclavian artery. The visualized portions of the thyroid gland are unremarkable. No axillary lymphadenopathy is appreciated. Lungs/Pleura: Patchy bilateral airspace opacities raise concern for pneumonia, most prominent at the lower lobes bilaterally. Scattered peripheral nodular opacities are noted  throughout both lungs. This is slightly improved from the prior study. There is a 2.9 cm cavitary focus at the right lung base, concerning for atypical infection. No pleural effusion or pneumothorax is seen. Upper Abdomen: The visualized portions of the liver and spleen are unremarkable. Musculoskeletal: No acute osseous abnormalities are identified. The visualized musculature is unremarkable in  appearance. Review of the MIP images confirms the above findings. IMPRESSION: 1. No evidence of pulmonary embolus. 2. As on the recent prior CT, bilateral pneumonia is again noted. Peripheral nodular opacities are seen throughout both lungs, slightly improved from the prior study. 2.9 cm cavitary focus at the right lung base is better characterized, and is concerning for atypical infection. 3. Enlarged 1.5 cm azygoesophageal recess node. Electronically Signed   By: Roanna Raider M.D.   On: 02/25/2017 04:52   Mr Laqueta Jean ZO Contrast  Result Date: 02/22/2017 CLINICAL DATA:  Weakness. Endocarditis, infective suspected. Fever and chills. EXAM: MRI HEAD WITHOUT AND WITH CONTRAST TECHNIQUE: Multiplanar, multiecho pulse sequences of the brain and surrounding structures were obtained without and with intravenous contrast. CONTRAST:  15mL MULTIHANCE GADOBENATE DIMEGLUMINE 529 MG/ML IV SOLN COMPARISON:  None. FINDINGS: Brain: No acute infarct, hemorrhage, or mass lesion is present. No focal white matter lesions are present. The postcontrast images demonstrate no pathologic enhancement. Is the internal auditory canals are within normal limits bilaterally. The brainstem and cerebellum are normal. No significant extra-axial fluid collection is present. Vascular: Flow is present in the major intracranial arteries. Skull and upper cervical spine: Skull base is within normal limits. The upper cervical spine is normal. The craniocervical junction is normal. Decreased T1 signal is compatible with known anemia. Sinuses/Orbits: The paranasal  sinuses and mastoid air cells are clear. Globes and orbits are within normal limits. IMPRESSION: Negative MRI the brain. No acute or focal lesion to explain the patient's weakness. No evidence for infection. Electronically Signed   By: Marin Roberts M.D.   On: 02/22/2017 20:42   Mr Cervical Spine Wo Contrast  Result Date: 02/17/2017 CLINICAL DATA:  Lower extremity weakness. Bacteremia, fever, leukocytosis. History of intravenous drug abuse. EXAM: MRI CERVICAL AND THORACIC SPINE WITHOUT CONTRAST TECHNIQUE: Multiplanar and multiecho pulse sequences of the cervical spine, to include the craniocervical junction and cervicothoracic junction, and thoracic spine, were obtained without intravenous contrast. Intravenous access not successfully gained, no contrast administered. COMPARISON:  MRI of the lumbar spine February 14, 2017 FINDINGS: MRI CERVICAL SPINE FINDINGS-moderately motion degraded examination. ALIGNMENT: Straightened cervical lordosis.  No malalignment. VERTEBRAE/DISCS: Vertebral bodies are intact. Intervertebral disc morphology's and signal are normal. CORD:Cervical spinal cord is normal morphology and signal characteristics from the cervicomedullary junction to level of T1-2, the most caudal well visualized level. POSTERIOR FOSSA, VERTEBRAL ARTERIES, PARASPINAL TISSUES: No MR findings of ligamentous injury. Vertebral artery flow voids present. Cerebellar tonsils descend slightly below the foramen magnum though, are not pointed in appearance and, do not reach criteria fecal ERA 1 malformation. DISC LEVELS: No disc bulge, canal stenosis nor neural foraminal narrowing. MRI THORACIC  SPINE FINDINGS-Mild motion degraded examination. ALIGNMENT: Maintenance of the thoracic kyphosis. No malalignment. VERTEBRAE/DISCS: Vertebral bodies are intact. Intervertebral discs morphology and signal are normal.12 mm T4 hemangioma. No suspicious or acute bone marrow signal. CORD: Thoracic spinal cord is normal morphology  and signal characteristics to the level of the conus medullaris which terminates at T12-L1. No epidural collection by noncontrast imaging. PREVERTEBRAL AND PARASPINAL SOFT TISSUES: Small RIGHT pleural effusion. No paraspinal fluid collection by noncontrast MRI. DISC LEVELS: No disc bulge, canal stenosis nor neural foraminal narrowing. IMPRESSION: 1. Negative moderately motion degraded noncontrast MRI of the cervical spine. 2. Negative mildly motion degraded noncontrast MRI of the thoracic spine. 3. Small RIGHT pleural effusion. Electronically Signed   By: Awilda Metro M.D.   On: 02/17/2017 04:44   Mr Thoracic Spine Wo Contrast  Result Date: 02/17/2017 CLINICAL DATA:  Lower extremity weakness. Bacteremia, fever, leukocytosis. History of intravenous drug abuse. EXAM: MRI CERVICAL AND THORACIC SPINE WITHOUT CONTRAST TECHNIQUE: Multiplanar and multiecho pulse sequences of the cervical spine, to include the craniocervical junction and cervicothoracic junction, and thoracic spine, were obtained without intravenous contrast. Intravenous access not successfully gained, no contrast administered. COMPARISON:  MRI of the lumbar spine February 14, 2017 FINDINGS: MRI CERVICAL SPINE FINDINGS-moderately motion degraded examination. ALIGNMENT: Straightened cervical lordosis.  No malalignment. VERTEBRAE/DISCS: Vertebral bodies are intact. Intervertebral disc morphology's and signal are normal. CORD:Cervical spinal cord is normal morphology and signal characteristics from the cervicomedullary junction to level of T1-2, the most caudal well visualized level. POSTERIOR FOSSA, VERTEBRAL ARTERIES, PARASPINAL TISSUES: No MR findings of ligamentous injury. Vertebral artery flow voids present. Cerebellar tonsils descend slightly below the foramen magnum though, are not pointed in appearance and, do not reach criteria fecal ERA 1 malformation. DISC LEVELS: No disc bulge, canal stenosis nor neural foraminal narrowing. MRI THORACIC   SPINE FINDINGS-Mild motion degraded examination. ALIGNMENT: Maintenance of the thoracic kyphosis. No malalignment. VERTEBRAE/DISCS: Vertebral bodies are intact. Intervertebral discs morphology and signal are normal.12 mm T4 hemangioma. No suspicious or acute bone marrow signal. CORD: Thoracic spinal cord is normal morphology and signal characteristics to the level of the conus medullaris which terminates at T12-L1. No epidural collection by noncontrast imaging. PREVERTEBRAL AND PARASPINAL SOFT TISSUES: Small RIGHT pleural effusion. No paraspinal fluid collection by noncontrast MRI. DISC LEVELS: No disc bulge, canal stenosis nor neural foraminal narrowing. IMPRESSION: 1. Negative moderately motion degraded noncontrast MRI of the cervical spine. 2. Negative mildly motion degraded noncontrast MRI of the thoracic spine. 3. Small RIGHT pleural effusion. Electronically Signed   By: Awilda Metro M.D.   On: 02/17/2017 04:44   Mr Sternum Wo Contrast  Result Date: 02/27/2017 CLINICAL DATA:  Right sternoclavicular septic arthritis. Pain with active motion. Weakness. EXAM: MRI sternum without contrast TECHNIQUE: Multiplanar, multiecho pulse sequences of the sternum without intravenous contrast. CONTRAST:  None COMPARISON:  02/25/2017 CT chest FINDINGS: Patient could not fully tolerate the exam. Coronal T2 fat saturated and postcontrast imaging was not possible. Trace intra-articular and periarticular edema about the right sternoclavicular joint is noted. Reactive marrow changes are seen about the joint. Findings are compatible with history of septic arthritis. No significant drainable fluid collections are noted. No soft tissue mass is seen. IMPRESSION: Reactive marrow changes about the right sternoclavicular joint with trace intra-articular and periarticular fluid/edema. Findings are in keeping with history of septic arthritis. The contralateral sternoclavicular joint is unremarkable. No marrow signal changes to  suggest acute osteomyelitis. Electronically Signed   By: Tollie Eth M.D.   On: 02/27/2017 22:08   Mr Shoulder Right W Wo Contrast  Result Date: 02/27/2017 CLINICAL DATA:  32 year old female with right sternoclavicular septic arthritis EXAM: MRI OF THE RIGHT SHOULDER WITHOUT AND WITH CONTRAST TECHNIQUE: Multiplanar, multisequence MR imaging of the right shoulder was performed before and after the administration of intravenous contrast. CONTRAST:  15mL MULTIHANCE GADOBENATE DIMEGLUMINE 529 MG/ML IV SOLN COMPARISON:  None. FINDINGS: Rotator cuff: Intact supraspinatus, infraspinatus, teres minor and subscapularis tendons. Muscles: No atrophy or abnormal signal of the muscles of the rotator cuff. Biceps long head:  Intact. Acromioclavicular Joint: Normal acromioclavicular joint. Type II curved acromion. Trace subacromial and subdeltoid bursal fluid and/or edema. Glenohumeral Joint: No joint effusion. No chondral defect. Labrum:  Intact. Bones: Small subcortical degenerative cystic change along the posterolateral aspect of the humeral head. Marrow edema  or fracture. No joint dislocation. Other: None. IMPRESSION: 1. Minimal subacromial bursitis. 2. No rotator cuff tear, marrow signal abnormality, fracture or joint dislocation. 3. Small focus of subcortical degenerate cystic change along the posterior aspect of the humeral head. Electronically Signed   By: Tollie Eth M.D.   On: 02/27/2017 22:04   Dg Chest Port 1 View  Result Date: 02/22/2017 CLINICAL DATA:  Chest pain EXAM: PORTABLE CHEST 1 VIEW COMPARISON:  February 13, 2017 chest radiograph and chest CT February 20, 2017 FINDINGS: There is patchy airspace consolidation in the lung bases consistent with pneumonia. Lungs elsewhere are clear. Note that patchy nodular opacity seen on recent CT in the upper lobes are not appreciable by radiography. Heart is borderline enlarged with pulmonary vascularity within normal limits. No adenopathy appreciable by radiography. No  bone lesions. IMPRESSION: Bibasilar airspace consolidation consistent with pneumonia. Nodular opacity seen in the upper lobe regions on recent CT are not appreciable by radiography. There is mild cardiomegaly. No adenopathy is appreciable by radiography. Electronically Signed   By: Bretta Bang III M.D.   On: 02/22/2017 21:49    Lab Data:  CBC: Recent Labs  Lab 03/12/17 0330 03/15/17 0426 03/17/17 0412 03/18/17 0330  WBC 4.4 3.8* 2.5* 1.8*  HGB 8.2* 8.7* 9.1* 9.1*  HCT 25.0* 27.1* 28.5* 28.1*  MCV 90.6 90.9 91.3 90.9  PLT 271 308 354 340   Basic Metabolic Panel: Recent Labs  Lab 03/12/17 0330 03/12/17 1511 03/15/17 0426 03/17/17 0412 03/18/17 0330  NA 139  --  139 138 139  K 3.4*  --  4.0 3.9 3.9  CL 104  --  105 109 107  CO2 25  --  25 22 23   GLUCOSE 98  --  103* 106* 98  BUN 9  --  16 16 17   CREATININE 0.81  --  0.81 0.78 0.77  CALCIUM 8.5*  --  8.7* 8.7* 8.7*  MG  --  1.8  --   --   --    GFR: Estimated Creatinine Clearance: 108.7 mL/min (by C-G formula based on SCr of 0.77 mg/dL). Liver Function Tests: Recent Labs  Lab 03/12/17 1511  ALBUMIN 2.3*   No results for input(s): LIPASE, AMYLASE in the last 168 hours. No results for input(s): AMMONIA in the last 168 hours. Coagulation Profile: No results for input(s): INR, PROTIME in the last 168 hours. Cardiac Enzymes: No results for input(s): CKTOTAL, CKMB, CKMBINDEX, TROPONINI in the last 168 hours. BNP (last 3 results) No results for input(s): PROBNP in the last 8760 hours. HbA1C: No results for input(s): HGBA1C in the last 72 hours. CBG: No results for input(s): GLUCAP in the last 168 hours. Lipid Profile: No results for input(s): CHOL, HDL, LDLCALC, TRIG, CHOLHDL, LDLDIRECT in the last 72 hours. Thyroid Function Tests: No results for input(s): TSH, T4TOTAL, FREET4, T3FREE, THYROIDAB in the last 72 hours. Anemia Panel: No results for input(s): VITAMINB12, FOLATE, FERRITIN, TIBC, IRON, RETICCTPCT in  the last 72 hours. Urine analysis:    Component Value Date/Time   COLORURINE AMBER (A) 02/13/2017 0436   APPEARANCEUR HAZY (A) 02/13/2017 0436   LABSPEC 1.014 02/13/2017 0436   PHURINE 5.0 02/13/2017 0436   GLUCOSEU NEGATIVE 02/13/2017 0436   HGBUR LARGE (A) 02/13/2017 0436   BILIRUBINUR NEGATIVE 02/13/2017 0436   KETONESUR NEGATIVE 02/13/2017 0436   PROTEINUR NEGATIVE 02/13/2017 0436   UROBILINOGEN 0.2 12/07/2012 1510   NITRITE POSITIVE (A) 02/13/2017 0436   LEUKOCYTESUR TRACE (A) 02/13/2017 0454  Thad Ranger M.D. [facility] 03/18/2017, 12:40 PM  Pager: [phone] Between 7am to 7pm - call Pager - [phone]  After 7pm go to [email or web address] - password [email or web address]  Call night coverage person covering after 7pm

## 2017-03-19 ENCOUNTER — Inpatient Hospital Stay (HOSPITAL_COMMUNITY): Payer: Medicaid Other

## 2017-03-19 DIAGNOSIS — D72819 Decreased white blood cell count, unspecified: Secondary | ICD-10-CM

## 2017-03-19 DIAGNOSIS — I959 Hypotension, unspecified: Secondary | ICD-10-CM

## 2017-03-19 DIAGNOSIS — R7881 Bacteremia: Secondary | ICD-10-CM

## 2017-03-19 DIAGNOSIS — R509 Fever, unspecified: Secondary | ICD-10-CM

## 2017-03-19 DIAGNOSIS — R5081 Fever presenting with conditions classified elsewhere: Secondary | ICD-10-CM

## 2017-03-19 DIAGNOSIS — R6510 Systemic inflammatory response syndrome (SIRS) of non-infectious origin without acute organ dysfunction: Secondary | ICD-10-CM

## 2017-03-19 DIAGNOSIS — D709 Neutropenia, unspecified: Secondary | ICD-10-CM

## 2017-03-19 LAB — DIFFERENTIAL
BASOS PCT: 0 %
BLASTS: 0 %
Band Neutrophils: 0 %
Basophils Absolute: 0 10*3/uL (ref 0.0–0.1)
Eosinophils Absolute: 0 10*3/uL (ref 0.0–0.7)
Eosinophils Relative: 0 %
LYMPHS PCT: 99 %
Lymphs Abs: 0.5 10*3/uL — ABNORMAL LOW (ref 0.7–4.0)
Metamyelocytes Relative: 0 %
Monocytes Absolute: 0 10*3/uL — ABNORMAL LOW (ref 0.1–1.0)
Monocytes Relative: 0 %
Myelocytes: 0 %
Neutro Abs: 0 10*3/uL — ABNORMAL LOW (ref 1.7–7.7)
Neutrophils Relative %: 1 %
OTHER: 0 %
PROMYELOCYTES ABS: 0 %
nRBC: 0 /100 WBC

## 2017-03-19 LAB — CBC
HCT: 29 % — ABNORMAL LOW (ref 36.0–46.0)
HEMOGLOBIN: 9.3 g/dL — AB (ref 12.0–15.0)
MCH: 29.1 pg (ref 26.0–34.0)
MCHC: 32.1 g/dL (ref 30.0–36.0)
MCV: 90.6 fL (ref 78.0–100.0)
PLATELETS: 369 10*3/uL (ref 150–400)
RBC: 3.2 MIL/uL — AB (ref 3.87–5.11)
RDW: 19.2 % — ABNORMAL HIGH (ref 11.5–15.5)
WBC: 1.2 10*3/uL — AB (ref 4.0–10.5)

## 2017-03-19 LAB — BASIC METABOLIC PANEL
Anion gap: 11 (ref 5–15)
BUN: 15 mg/dL (ref 6–20)
CO2: 19 mmol/L — ABNORMAL LOW (ref 22–32)
Calcium: 8.8 mg/dL — ABNORMAL LOW (ref 8.9–10.3)
Chloride: 106 mmol/L (ref 101–111)
Creatinine, Ser: 0.8 mg/dL (ref 0.44–1.00)
Glucose, Bld: 94 mg/dL (ref 65–99)
POTASSIUM: 3.9 mmol/L (ref 3.5–5.1)
SODIUM: 136 mmol/L (ref 135–145)

## 2017-03-19 LAB — INFLUENZA PANEL BY PCR (TYPE A & B)
INFLAPCR: NEGATIVE
Influenza B By PCR: NEGATIVE

## 2017-03-19 LAB — LACTIC ACID, PLASMA: LACTIC ACID, VENOUS: 1 mmol/L (ref 0.5–1.9)

## 2017-03-19 LAB — PROCALCITONIN: PROCALCITONIN: 2.49 ng/mL

## 2017-03-19 MED ORDER — SODIUM CHLORIDE 0.9 % IV BOLUS (SEPSIS)
500.0000 mL | Freq: Once | INTRAVENOUS | Status: AC
  Administered 2017-03-19: 500 mL via INTRAVENOUS

## 2017-03-19 MED ORDER — SODIUM CHLORIDE 0.9 % IV SOLN
INTRAVENOUS | Status: DC
Start: 2017-03-19 — End: 2017-03-20
  Administered 2017-03-19 – 2017-03-20 (×2): via INTRAVENOUS

## 2017-03-19 MED ORDER — SODIUM CHLORIDE 0.9 % IV SOLN
2.0000 g | Freq: Three times a day (TID) | INTRAVENOUS | Status: DC
  Administered 2017-03-19 – 2017-03-22 (×9): 2 g via INTRAVENOUS
  Filled 2017-03-19 (×11): qty 2

## 2017-03-19 MED ORDER — PHENOL 1.4 % MT LIQD
1.0000 | OROMUCOSAL | Status: DC | PRN
  Administered 2017-03-19: 1 via OROMUCOSAL
  Filled 2017-03-19: qty 177

## 2017-03-19 MED ORDER — IBUPROFEN 400 MG PO TABS
800.0000 mg | ORAL_TABLET | Freq: Three times a day (TID) | ORAL | Status: DC | PRN
  Administered 2017-03-19 – 2017-03-21 (×4): 800 mg via ORAL
  Filled 2017-03-19 (×4): qty 2

## 2017-03-19 MED ORDER — SODIUM CHLORIDE 0.9 % IV BOLUS (SEPSIS)
1000.0000 mL | Freq: Once | INTRAVENOUS | Status: AC
  Administered 2017-03-19: 1000 mL via INTRAVENOUS

## 2017-03-19 MED ORDER — OXYCODONE HCL 5 MG PO TABS
5.0000 mg | ORAL_TABLET | Freq: Four times a day (QID) | ORAL | Status: DC | PRN
  Administered 2017-03-19 – 2017-03-20 (×3): 5 mg via ORAL
  Filled 2017-03-19 (×3): qty 1

## 2017-03-19 MED ORDER — PANTOPRAZOLE SODIUM 40 MG PO TBEC
40.0000 mg | DELAYED_RELEASE_TABLET | Freq: Every day | ORAL | Status: DC
  Administered 2017-03-19 – 2017-03-20 (×2): 40 mg via ORAL
  Filled 2017-03-19 (×2): qty 1

## 2017-03-19 NOTE — Progress Notes (Signed)
Pharmacy Antibiotic Note  [name] is a [age] y.o. female admitted on 02/13/2017 with neutropenia/sepsis.  Pharmacy has been consulted for cefepime dosing.  Plan: Cefepime 2 grams IV q8 hours  D/c Nafcillin Monitor clinical progress, cultures/sensitivities, renal function, abx plan   Height: 5\' 7"  (170.2 cm) Weight: 171 lb 14.4 oz (78 kg) IBW/kg (Calculated) : 61.6  Temp (24hrs), Avg:99.4 F (37.4 C), Min:97.4 F (36.3 C), Max:102 F (38.9 C)  Recent Labs  Lab 03/15/17 0426 03/17/17 0412 03/18/17 0330 03/19/17 0426  WBC 3.8* 2.5* 1.8* 1.2*  CREATININE 0.81 0.78 0.77 0.80    Estimated Creatinine Clearance: 108.7 mL/min (by C-G formula based on SCr of 0.8 mg/dL).    Allergies  Allergen Reactions  . Azithromycin Nausea And Vomiting    ABDOMINAL PAIN  . Nsaids     UNSPECIFIED REACTION    . Hydrocodone Nausea And Vomiting  . Penicillins Nausea And Vomiting     Has patient had a PCN reaction causing immediate rash, facial/tongue/throat swelling, SOB or lightheadedness with hypotension: No Has patient had a PCN reaction causing severe rash involving mucus membranes or skin necrosis: No Has patient had a PCN reaction that required hospitalization No Has patient had a PCN reaction occurring within the last 10 years: No If all of the above answers are "NO", then may proceed with Cephalosporin use.   . Prednisone Nausea Only and Other (See Comments)    Can take shot, but pill form "caused stomach pain , nausea"    Antimicrobials this admission: Vanc 1/31>>2/1 Zosyn 1/31>>2/1 Ancef 2/1>>2/3; 2/6>>2/11 Nafcillin 2/11 >> 3/6 originally planned through 4/2 (using 2/6 as D1) Bactrim PO 2/3>>2/5 Zyvox 2/5>>2/6 Cefepime 3/6>>  Dose adjustments this admission:  Microbiology results: 1/31 BCx: 2/2 MSSA 2/1 BCx: 1/1 MSSA 2/4 BCx: 1/2 MSSA 2/6 BCx: NGF 3/6 BCx: sent   Thank you for allowing pharmacy to participate in this patients care.  Signe Coltonya C Lynkin Saini,  PharmD Clinical phone for 03/19/2017 from 7a-3:30p: [phone] If after 3:30p, please call main pharmacy at: [phone] 03/19/2017 2:00 PM

## 2017-03-19 NOTE — Progress Notes (Signed)
[facility]                                                                              Patient Demographics  [name], is a 32 y.o. female, DOB - 1985-06-26, ZOX:[id number]  Admit date - 02/13/2017   Admitting Physician Carron Curie, MD  Outpatient Primary MD for the patient is Pearson Grippe, MD  Outpatient specialists:   LOS - 34  days   Medical records reviewed and are as summarized below:    Chief Complaint  Patient presents with  . Weakness       Brief summary   32 year old female with past medical history significant for chronic anemia and IV drug use. Patient presented to the emergency room on 02/13/2017 with complaints of fevers and chills and generalized weakness for 2 days prior to presentation. Patient was noted to have erythema and petechiae on her left hand and lower extremities and found to have MSSA bacteremia/sepsis. 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 Patient was not felt to be a candidate for valve replacement as per CT surgery due to her history of ongoing drug use. Patient was also found to have right sternoclavicular septic arthritis. Patient was seen and evaluated by infectious disease. Patient is currently on IV nafcillin, and patient with complete 6-8 weeks of IV antibiotics therapy (antibiotic therapy suspected to and on 04 /2/19). PICC line has been placed. Patient is not able to go home with PICC line given patient's illicit drug use habit. No facility is willing to accept patient. The plan is for the patient to remain in the hospital and complete the course of IV antibiotics.    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  -Right sternoclavicular septic  arthritis all secondary to IV drug use and septic Staphylococcus infection -Repeat blood cultures 2/6 has been negative so far. ID had concern regarding risk of embolization across the PFO to CNS or kidneys in the left sided circulation that could occur even if vegetation is sterile. Dr. Tyrone Sage does not recommend TVR in context of her ongoing drug use.  -Patient will complete 6-8 weeks of IV Nafcillin (this will be completed on 04/15/2017), will remain in the hospital until then -Dr Algis Liming recommended repeat 2-D echo and likely repeat TEE to assess for vegetations in next 4 weeks, CT of the clavicle by 3/15. Follow MRI of sternum, ordered for 3/15  Neutropenia, SIRS - Noted WBC count low today 1.2 with toxic granulation and atypical lymphocytes, WBC count trending down in the last few days. - Discussed with hematology, Dr Clelia Croft, unlikely due to antibiotics as patient has been on nafcillin since 02/21/17. No new medication started recently which could cause neutropenia. Per hematology, likely due to bacteremia, viral illness, infectious or reactive. No need for colony stim factor Neupogen etc at this time unless ANC less than 500 with sepsis. Treat the underlying cause. -placed on aggressive IV fluid hydration, obtain  repeat blood cultures, lactic acid, pro-calcitonin - influenza panel negative - PICC line doesn't look infected, Placed on IV cefepime, discussed with ID, Dr. 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  Septic arthritis of right sternoclavicular joint (HCC)  -Complete course of antibiotics.  PFO (patent foramen ovale)  Tricuspid valve vegetation  Depression -On Cymbalta and Zyprexa  Chronic diastolic heart failure  -compensated, received Lasix  - BNP slightly elevated at 222 - Follow volume status closely  B/l LE edema -due to severe protein calorie malnutrition albumin 1.6.  - Received Lasix 40 mg 2 doses on 3/1  Code Status: full CODE STATUS DVT Prophylaxis:  Lovenox  Family Communication: Discussed in detail with the patient, all imaging results, lab results explained to the patient   Disposition Plan: to stay inpatient till completes antibiotics by 4/2  Time Spent in minutes  35 minutes  Procedures:  2-D echo 2/1 showed grade 1 diastolic dysfunction, mild tricuspid regurgitation TEE 2/7 vegetation noted on tricuspid valve with large PFO  Consultants:    infectious disease Cardiology CT surgery  Antimicrobials:  IV Ancef 2/-2/8 Zyvox 2/5-2/6, 2/17-/18 IV nafcillin 2/8-present IV vancomycin and Zosyn 1/31-2/1      Medications  Scheduled Meds: . buprenorphine-naloxone  2 tablet Sublingual Daily  . DULoxetine  60 mg Oral BID  . enoxaparin (LOVENOX) injection  40 mg Subcutaneous Q24H  . ferrous sulfate  325 mg Oral TID WC  . gadobenate dimeglumine  15 mL Intravenous Once  . multivitamin with minerals  1 tablet Oral Daily  . OLANZapine  5 mg Oral q morning - 10a  . polyethylene glycol  17 g Oral Daily  . senna-docusate  1 tablet Oral BID  . sodium chloride flush  10-40 mL Intracatheter Q12H   Continuous Infusions: . sodium chloride    . nafcillin IV 2 g (03/19/17 1339)  . sodium chloride     PRN Meds:.acetaminophen, ALPRAZolam, cyclobenzaprine, magic mouthwash, ondansetron **OR** ondansetron (ZOFRAN) IV, oxyCODONE, sodium chloride flush, sodium chloride flush, zolpidem   Antibiotics   Anti-infectives (From admission, onward)     Start     Dose/Rate Route Frequency Ordered Stop   03/02/17 2200  linezolid (ZYVOX) tablet 600 mg  Status:  Discontinued     600 mg Oral Every 12 hours 03/02/17 2037 03/03/17 1203   02/24/17 1600  nafcillin 2 g in sodium chloride 0.9 % 100 mL IVPB     2 g 200 mL/hr over 30 Minutes Intravenous Every 4 hours 02/24/17 0833     02/21/17 2000  nafcillin 2 g in dextrose 5 % 100 mL IVPB  Status:  Discontinued     2 g 200 mL/hr over 30 Minutes Intravenous Every 4 hours 02/21/17 1741 02/24/17 0910   02/21/17 1730  nafcillin injection 2 g  Status:  Discontinued     2 g Intravenous Every 4 hours 02/21/17 1726 02/21/17 1740   02/19/17 1230  ceFAZolin (ANCEF) IVPB 2g/100 mL premix  Status:  Discontinued     2 g 200 mL/hr  over 30 Minutes Intravenous Every 8 hours 02/19/17 1148 02/21/17 1726   02/18/17 1330  linezolid (ZYVOX) tablet 600 mg  Status:  Discontinued     600 mg Oral Every 12 hours 02/18/17 1250 02/19/17 1126   02/17/17 0800  sulfamethoxazole-trimethoprim (BACTRIM DS,SEPTRA DS) 800-160 MG per tablet 2 tablet  Status:  Discontinued     2 tablet Oral Every 12 hours 02/16/17 1453 02/18/17 1250   02/16/17 1600  sulfamethoxazole-trimethoprim (BACTRIM DS,SEPTRA DS) 800-160 MG per tablet 2 tablet     2 tablet Oral  Once 02/16/17 1509 02/16/17 1657   02/14/17 1400  ceFAZolin (ANCEF) IVPB 2g/100 mL premix  Status:  Discontinued     2 g 200 mL/hr over 30 Minutes Intravenous Every 8 hours 02/14/17 1013 02/16/17 1453   02/14/17 0200  vancomycin (VANCOCIN) IVPB 750 mg/150 ml premix  Status:  Discontinued     750 mg 150 mL/hr over 60 Minutes Intravenous Every 8 hours 02/13/17 1739 02/14/17 1017   02/14/17 0000  piperacillin-tazobactam (ZOSYN) IVPB 3.375 g  Status:  Discontinued     3.375 g 12.5 mL/hr over 240 Minutes Intravenous Every 8 hours 02/13/17 1739 02/14/17 1013   02/13/17 1715  piperacillin-tazobactam (ZOSYN) IVPB 3.375 g     3.375 g 100 mL/hr over 30 Minutes Intravenous  Once 02/13/17 1701  02/13/17 1824   02/13/17 1715  vancomycin (VANCOCIN) IVPB 1000 mg/200 mL premix  Status:  Discontinued     1,000 mg 200 mL/hr over 60 Minutes Intravenous  Once 02/13/17 1701 02/13/17 1705   02/13/17 1715  vancomycin (VANCOCIN) 1,500 mg in sodium chloride 0.9 % 500 mL IVPB     1,500 mg 250 mL/hr over 120 Minutes Intravenous  Once 02/13/17 1705 02/13/17 2300        Subjective:   [name] was seen and examined today.  Not feeling well, sore throat, fevers, muscle aches. Tmax 102. Patient denies dizziness, abdominal pain, N/V/D/C,  numbess, tingling.   Objective:   Vitals:   03/18/17 0457 03/18/17 1440 03/18/17 2100 03/19/17 1333  BP: (!) 106/99 116/74 117/84 (!) 96/45  Pulse: (!) 101 (!) 103 98 (!) 134  Resp: 18 16 18 18   Temp: 98.7 F (37.1 C) (!) 97.4 F (36.3 C) 98.9 F (37.2 C) (!) 102 F (38.9 C)  TempSrc: Oral  Oral Oral  SpO2: 98% 100% 98% 96%  Weight:      Height:        Intake/Output Summary (Last 24 hours) at 03/19/2017 1351 Last data filed at 03/19/2017 1300 Gross per 24 hour  Intake 1324 ml  Output -  Net 1324 ml     Wt Readings from Last 3 Encounters:  03/18/17 78 kg (171 lb 14.4 oz)  02/10/17 73.9 kg (163 lb)  06/13/16 73 kg (161 lb)     Exam  General: Alert and oriented x 3, does not feel too good, chills Eyes:  HEENT:   Cardiovascular: S1 S2 auscultated, tachycardia No pedal edema b/l Respiratory: Decreased breath sounds at the bases Gastrointestinal: Soft, nontender, nondistended, + bowel sounds Ext: no pedal edema bilaterally Neuro: no new deficits Musculoskeletal: No digital cyanosis, clubbing Skin: No rashes Psych: anxious, alert and oriented x3      Data Reviewed:  I have personally reviewed following labs and imaging studies  Micro Results No results found for this or any previous visit (from the past 240 hour(s)).  Radiology Reports Dg Shoulder Right  Result Date: 03/14/2017 CLINICAL DATA:  Anterior and  posterior RIGHT  shoulder pain, septic RIGHT sternoclavicular joint since 02/13/2017, smoker EXAM: RIGHT SHOULDER - 2+ VIEW COMPARISON:  RIGHT shoulder MRI 02/27/2017 FINDINGS: Osseous mineralization normal. AC joint alignment normal. No glenohumeral fracture, dislocation, or bone destruction. Visualized ribs intact. RIGHT arm PICC line noted. IMPRESSION: No acute osseous abnormalities. Electronically Signed   By: Ulyses Southward M.D.   On: 03/14/2017 10:23   Ct Chest W Contrast  Result Date: 02/20/2017 CLINICAL DATA:  Acute onset of generalized chest pain. Assess for sternoclavicular septic arthritis. EXAM: CT CHEST WITH CONTRAST TECHNIQUE: Multidetector CT imaging of the chest was performed during intravenous contrast administration. CONTRAST:  75 mL ISOVUE-300 IOPAMIDOL (ISOVUE-300) INJECTION 61% COMPARISON:  MRI of the thoracic spine performed 02/17/2017 FINDINGS: Cardiovascular: The heart is borderline normal in size. The thoracic aorta is grossly unremarkable. A retroesophageal right subclavian artery is incidentally noted. The great vessels are unremarkable in appearance. Mediastinum/Nodes: Enlarged mediastinal nodes are noted at the subcarinal and azygoesophageal regions, aortopulmonary window, right paratracheal region and periaortic region, measuring up to 1.4 cm in short axis. Mildly prominent bilateral hilar nodes measure up to the 1.2 cm in short axis. No pericardial effusion is identified. The visualized portions of the thyroid gland are unremarkable. No axillary lymphadenopathy is appreciated. Lungs/Pleura: There is patchy airspace opacification involving the lower lobes bilaterally. Scattered nodular opacities are seen throughout both lungs. A small cavitary nodule is suggested at the right lung apex. This may reflect diffuse infection, possibly atypical in nature. Underlying metastatic disease cannot be excluded. Trace bilateral pleural fluid is noted.  No pneumothorax is seen. Upper Abdomen: The visualized portions  of the liver are unremarkable. The spleen is enlarged, measuring 14.6 cm in length. The patient is status post cholecystectomy, with clips noted at the gallbladder fossa. The visualized portions of the pancreas and adrenal glands are within normal limits. Musculoskeletal: There is asymmetric prominence of soft tissue density tracking about the right sternoclavicular joint, with mild underlying soft tissue inflammation. Given clinical concern, this could reflect right sternoclavicular joint septic arthritis. IMPRESSION: 1. Patchy airspace opacification involving the lower lung lobes bilaterally. Scattered nodular opacities throughout both lungs. Small cavitary nodule suggested at the right lung apex. Findings are concerning for diffuse infection, possibly atypical in nature. Underlying metastatic disease cannot be excluded. Would correlate clinically, and perform follow-up CT of the chest 3-4 weeks after completion of treatment for pneumonia. 2. Trace bilateral pleural fluid noted. 3. Asymmetric prominence of soft tissue density tracking about the right sternoclavicular joint, with mild underlying soft tissue inflammation. Given clinical concern, this could reflect right-sided clavicular joint septic arthritis. 4. Diffusely prominent mediastinal and bilateral hilar nodes noted. This may reflect the underlying infection. 5. Retroesophageal right subclavian artery incidentally noted. Electronically Signed   By: Roanna Raider M.D.   On: 02/20/2017 01:40   Ct Angio Chest Pe W Or Wo Contrast  Result Date: 02/25/2017 CLINICAL DATA:  Acute onset of pleuritic chest pain and shortness of breath. Elevated D-dimer. EXAM: CT ANGIOGRAPHY CHEST WITH CONTRAST TECHNIQUE: Multidetector CT imaging of the chest was performed using the standard protocol during bolus administration of intravenous contrast. Multiplanar CT image reconstructions and MIPs were obtained to evaluate the vascular anatomy. CONTRAST:  ISOVUE-370  IOPAMIDOL (ISOVUE-370) INJECTION 76% COMPARISON:  Chest radiograph performed 02/23/2016, and CT of the chest performed 02/20/2017 FINDINGS: Cardiovascular:  There is no evidence of pulmonary embolus. The heart is borderline enlarged. The thoracic aorta is grossly unremarkable in appearance. The great vessels are within  normal limits. Mediastinum/Nodes: A 1.5 cm azygoesophageal recess node is noted. Visualized hilar nodes are borderline normal in size. No pericardial effusion is identified. Incidental note is made of a retroesophageal right subclavian artery. The visualized portions of the thyroid gland are unremarkable. No axillary lymphadenopathy is appreciated. Lungs/Pleura: Patchy bilateral airspace opacities raise concern for pneumonia, most prominent at the lower lobes bilaterally. Scattered peripheral nodular opacities are noted throughout both lungs. This is slightly improved from the prior study. There is a 2.9 cm cavitary focus at the right lung base, concerning for atypical infection. No pleural effusion or pneumothorax is seen. Upper Abdomen: The visualized portions of the liver and spleen are unremarkable. Musculoskeletal: No acute osseous abnormalities are identified. The visualized musculature is unremarkable in appearance. Review of the MIP images confirms the above findings. IMPRESSION: 1. No evidence of pulmonary embolus. 2. As on the recent prior CT, bilateral pneumonia is again noted. Peripheral nodular opacities are seen throughout both lungs, slightly improved from the prior study. 2.9 cm cavitary focus at the right lung base is better characterized, and is concerning for atypical infection. 3. Enlarged 1.5 cm azygoesophageal recess node. Electronically Signed   By: Roanna Raider M.D.   On: 02/25/2017 04:52   Mr Laqueta Jean ZO Contrast  Result Date: 02/22/2017 CLINICAL DATA:  Weakness. Endocarditis, infective suspected. Fever and chills. EXAM: MRI HEAD WITHOUT AND WITH CONTRAST TECHNIQUE:  Multiplanar, multiecho pulse sequences of the brain and surrounding structures were obtained without and with intravenous contrast. CONTRAST:  15mL MULTIHANCE GADOBENATE DIMEGLUMINE 529 MG/ML IV SOLN COMPARISON:  None. FINDINGS: Brain: No acute infarct, hemorrhage, or mass lesion is present. No focal white matter lesions are present. The postcontrast images demonstrate no pathologic enhancement. Is the internal auditory canals are within normal limits bilaterally. The brainstem and cerebellum are normal. No significant extra-axial fluid collection is present. Vascular: Flow is present in the major intracranial arteries. Skull and upper cervical spine: Skull base is within normal limits. The upper cervical spine is normal. The craniocervical junction is normal. Decreased T1 signal is compatible with known anemia. Sinuses/Orbits: The paranasal sinuses and mastoid air cells are clear. Globes and orbits are within normal limits. IMPRESSION: Negative MRI the brain. No acute or focal lesion to explain the patient's weakness. No evidence for infection. Electronically Signed   By: Marin Roberts M.D.   On: 02/22/2017 20:42   Mr Sternum Wo Contrast  Result Date: 02/27/2017 CLINICAL DATA:  Right sternoclavicular septic arthritis. Pain with active motion. Weakness. EXAM: MRI sternum without contrast TECHNIQUE: Multiplanar, multiecho pulse sequences of the sternum without intravenous contrast. CONTRAST:  None COMPARISON:  02/25/2017 CT chest FINDINGS: Patient could not fully tolerate the exam. Coronal T2 fat saturated and postcontrast imaging was not possible. Trace intra-articular and periarticular edema about the right sternoclavicular joint is noted. Reactive marrow changes are seen about the joint. Findings are compatible with history of septic arthritis. No significant drainable fluid collections are noted. No soft tissue mass is seen. IMPRESSION: Reactive marrow changes about the right sternoclavicular joint  with trace intra-articular and periarticular fluid/edema. Findings are in keeping with history of septic arthritis. The contralateral sternoclavicular joint is unremarkable. No marrow signal changes to suggest acute osteomyelitis. Electronically Signed   By: Tollie Eth M.D.   On: 02/27/2017 22:08   Mr Shoulder Right W Wo Contrast  Result Date: 02/27/2017 CLINICAL DATA:  32 year old female with right sternoclavicular septic arthritis EXAM: MRI OF THE RIGHT SHOULDER WITHOUT AND WITH CONTRAST TECHNIQUE: Multiplanar,  multisequence MR imaging of the right shoulder was performed before and after the administration of intravenous contrast. CONTRAST:  15mL MULTIHANCE GADOBENATE DIMEGLUMINE 529 MG/ML IV SOLN COMPARISON:  None. FINDINGS: Rotator cuff: Intact supraspinatus, infraspinatus, teres minor and subscapularis tendons. Muscles: No atrophy or abnormal signal of the muscles of the rotator cuff. Biceps long head:  Intact. Acromioclavicular Joint: Normal acromioclavicular joint. Type II curved acromion. Trace subacromial and subdeltoid bursal fluid and/or edema. Glenohumeral Joint: No joint effusion. No chondral defect. Labrum:  Intact. Bones: Small subcortical degenerative cystic change along the posterolateral aspect of the humeral head. Marrow edema or fracture. No joint dislocation. Other: None. IMPRESSION: 1. Minimal subacromial bursitis. 2. No rotator cuff tear, marrow signal abnormality, fracture or joint dislocation. 3. Small focus of subcortical degenerate cystic change along the posterior aspect of the humeral head. Electronically Signed   By: Tollie Eth M.D.   On: 02/27/2017 22:04   Dg Chest Port 1 View  Result Date: 02/22/2017 CLINICAL DATA:  Chest pain EXAM: PORTABLE CHEST 1 VIEW COMPARISON:  February 13, 2017 chest radiograph and chest CT February 20, 2017 FINDINGS: There is patchy airspace consolidation in the lung bases consistent with pneumonia. Lungs elsewhere are clear. Note that patchy nodular  opacity seen on recent CT in the upper lobes are not appreciable by radiography. Heart is borderline enlarged with pulmonary vascularity within normal limits. No adenopathy appreciable by radiography. No bone lesions. IMPRESSION: Bibasilar airspace consolidation consistent with pneumonia. Nodular opacity seen in the upper lobe regions on recent CT are not appreciable by radiography. There is mild cardiomegaly. No adenopathy is appreciable by radiography. Electronically Signed   By: Bretta Bang III M.D.   On: 02/22/2017 21:49    Lab Data:  CBC: Recent Labs  Lab 03/15/17 0426 03/17/17 0412 03/18/17 0330 03/19/17 0426 03/19/17 1048  WBC 3.8* 2.5* 1.8* 1.2*  --   NEUTROABS  --   --   --   --  0.0*  HGB 8.7* 9.1* 9.1* 9.3*  --   HCT 27.1* 28.5* 28.1* 29.0*  --   MCV 90.9 91.3 90.9 90.6  --   PLT 308 354 340 369  --    Basic Metabolic Panel: Recent Labs  Lab 03/12/17 1511 03/15/17 0426 03/17/17 0412 03/18/17 0330 03/19/17 0426  NA  --  139 138 139 136  K  --  4.0 3.9 3.9 3.9  CL  --  105 109 107 106  CO2  --  25 22 23  19*  GLUCOSE  --  103* 106* 98 94  BUN  --  16 16 17 15   CREATININE  --  0.81 0.78 0.77 0.80  CALCIUM  --  8.7* 8.7* 8.7* 8.8*  MG 1.8  --   --   --   --    GFR: Estimated Creatinine Clearance: 108.7 mL/min (by C-G formula based on SCr of 0.8 mg/dL). Liver Function Tests: Recent Labs  Lab 03/12/17 1511  ALBUMIN 2.3*   No results for input(s): LIPASE, AMYLASE in the last 168 hours. No results for input(s): AMMONIA in the last 168 hours. Coagulation Profile: No results for input(s): INR, PROTIME in the last 168 hours. Cardiac Enzymes: No results for input(s): CKTOTAL, CKMB, CKMBINDEX, TROPONINI in the last 168 hours. BNP (last 3 results) No results for input(s): PROBNP in the last 8760 hours. HbA1C: No results for input(s): HGBA1C in the last 72 hours. CBG: No results for input(s): GLUCAP in the last 168 hours. Lipid Profile: No results  for  input(s): CHOL, HDL, LDLCALC, TRIG, CHOLHDL, LDLDIRECT in the last 72 hours. Thyroid Function Tests: No results for input(s): TSH, T4TOTAL, FREET4, T3FREE, THYROIDAB in the last 72 hours. Anemia Panel: No results for input(s): VITAMINB12, FOLATE, FERRITIN, TIBC, IRON, RETICCTPCT in the last 72 hours. Urine analysis:    Component Value Date/Time   COLORURINE AMBER (A) 02/13/2017 0436   APPEARANCEUR HAZY (A) 02/13/2017 0436   LABSPEC 1.014 02/13/2017 0436   PHURINE 5.0 02/13/2017 0436   GLUCOSEU NEGATIVE 02/13/2017 0436   HGBUR LARGE (A) 02/13/2017 0436   BILIRUBINUR NEGATIVE 02/13/2017 0436   KETONESUR NEGATIVE 02/13/2017 0436   PROTEINUR NEGATIVE 02/13/2017 0436   UROBILINOGEN 0.2 12/07/2012 1510   NITRITE POSITIVE (A) 02/13/2017 0436   LEUKOCYTESUR TRACE (A) 02/13/2017 0436     Ripudeep Rai M.D. [facility] 03/19/2017, 1:51 PM  Pager: [phone] Between 7am to 7pm - call Pager - [phone]  After 7pm go to [email or web address] - password [email or web address]  Call night coverage person covering after 7pm

## 2017-03-19 NOTE — Progress Notes (Signed)
    [facility]   Reason for visit: Follow up on called for reevaluation for this patient regarding new fever and concern for new infection.  She is tachycardic, hypotensive and febrile.    Interval History: no n/v.  No sob.    Physical Exam: Constitutional:  Vitals:   03/19/17 1333 03/19/17 1411  BP: (!) 96/45 98/62  Pulse: (!) 134 (!) 130  Resp: 18 (!) 23  Temp: (!) 102 F (38.9 C)   SpO2: 96% 96%   patient appears in mild distress Eyes: anicteric HENT: no thrush; no caries, dentition ok Respiratory: Normal respiratory effort; CTA B Cardiovascular: Tachy RR GI: soft, nt, nd  Review of Systems: Constitutional: negative for anorexia, positive for fever, chills Gastrointestinal: negative for diarrhea Integument/breast: negative for rash Hematologic/lymphatic: negative for lymphadenopathy, though feels some swelling on left neck area.    Lab Results  Component Value Date   WBC 1.2 (LL) 03/19/2017   HGB 9.3 (L) 03/19/2017   HCT 29.0 (L) 03/19/2017   MCV 90.6 03/19/2017   PLT 369 03/19/2017    Lab Results  Component Value Date   CREATININE 0.80 03/19/2017   BUN 15 03/19/2017   NA 136 03/19/2017   K 3.9 03/19/2017   CL 106 03/19/2017   CO2 19 (L) 03/19/2017    Lab Results  Component Value Date   ALT 22 02/18/2017   AST 50 (H) 02/18/2017   ALKPHOS 173 (H) 02/18/2017     Microbiology: No results found for this or any previous visit (from the past 240 hour(s)).  Impression/Plan:  1. Fever - concerns for line infection, no other obvious source.  Tachycardic and SIRS.  If lactate up, may consider removing picc line empirically and give line holiday, though it looks ok.  Certainly if blood cultures positive will need to remove it.  I would less suspect emboli at this point and acute showering of vegetation but possible.  Could check a CXR, echo.    2.  Bacteremia - on cefepime and will cover MSSA as well.    3.  Leukopenia - I agree with  hematology that less likely nafcillin but still possible and would not restart. More likely a result of new sepsis/sirs.  Continue to monitor WBC.

## 2017-03-19 NOTE — Progress Notes (Signed)
CRITICAL VALUE ALERT  Critical Value:  WBC 1.2  Date & Time Notied:  03/19/2017 0744  Provider Notified: Dr Isidoro Donning notified

## 2017-03-19 NOTE — Consult Note (Signed)
Reason for Referral: Neutropenia  HPI: 32 year old woman hospitalized on February 13, 2017 after presenting with a fever and weakness and found to have MSSA bacteremia.  She was started initially on broad-spectrum antibiotics including vancomycin and cefepime and subsequently was switched to nafcillin.  She has been on this antibiotic since that time.  She had a PICC line inserted and has been receiving antibiotics among other treatment via the PICC line.  Her white cell count was elevated due to infection and subsequently has been normal until March 12, 2017 where her white cell count was 4.4 with normal platelet count and a hemoglobin of 8.2.  Her white cell count has declined last few days and on 03/19/2017 her white cell count was 1.2.  Her hemoglobin was 9.3 and platelet count of 369.  Her differential showed no neutrophils with predominantly lymphocytes.  She also started developing symptoms of fatigue, headache and chills.  She was found to be febrile with hypotension and tachycardia this morning.  She does not report any blurry vision, syncope or seizures. Does not report any weight loss or appetite changes.  Does not report any cough, wheezing or hemoptysis.  Does not report any chest pain, palpitation, orthopnea or leg edema.  Does not report any nausea, vomiting or abdominal pain.  Does not report any constipation or diarrhea.  Does not report any skeletal complaints.    Does not report frequency, urgency or hematuria.  Does not report any skin rashes or lesions. Does not report any heat or cold intolerance.  Does not report any lymphadenopathy or petechiae.  Does not report any anxiety or depression.  Remaining review of systems is negative.    Past Medical History:  Diagnosis Date  . Abdominal pain, unspecified site   . Anemia, unspecified   . Anxiety   . Anxiety state, unspecified   . Asthma   . Disc herniation    causes sciatica unsure which disc  . Esophageal reflux   . Fatty liver    . Fibromyalgia   . Gastroparesis   . Headache(784.0)   . Heart murmur   . History of narcotic addiction (HCC) 09/30/2012   2010:  Per pt, was addicted to narcotics; mom helped intervene and stopped taking opiods   . Hyperemesis gravidarum with metabolic disturbance, unspecified as to episode of care   . Irritable bowel syndrome   . Irritable bowel syndrome   . MSSA bacteremia 02/20/2017  . Nausea alone   . Other and unspecified noninfectious gastroenteritis and colitis(558.9)   . Other dysphagia   . Pregnant   . PTSD (post-traumatic stress disorder)   . Septic arthritis of right sternoclavicular joint (HCC) 02/20/2017  . Unspecified asthma(493.90)   . Unspecified constipation   :  Past Surgical History:  Procedure Laterality Date  . APPENDECTOMY    . CHOLECYSTECTOMY    . COLONOSCOPY    . LAPAROSCOPIC BILATERAL SALPINGECTOMY Bilateral 12/08/2012   Procedure: LAPAROSCOPIC BILATERAL SALPINGECTOMY;  Surgeon: Tilda Burrow, MD;  Location: [facility];  Service: Gynecology;  Laterality: Bilateral;  . MULTIPLE EXTRACTIONS WITH ALVEOLOPLASTY N/A 12/01/2015   Procedure: EXTRACTION TEETH TWO, THREE, FOUR, SIX, SEVEN, EIGHT, NINE, TEN, ELEVEN, TWELVE, FOURTEEN, FIFTEEN, TWENTY, TWENTY ONE, TWENTY EIGHT, TWENTY NINE, THIRTY AND THIRTY ONE WITH ALVEOLOPLASTY;  Surgeon: Ocie Doyne, DDS;  Location: [facility];  Service: Oral Surgery;  Laterality: N/A;  . TEE WITHOUT CARDIOVERSION N/A 02/20/2017   Procedure: TRANSESOPHAGEAL ECHOCARDIOGRAM (TEE);  Surgeon: Laurey Morale, MD;  Location: [facility];  Service: Cardiovascular;  Laterality: N/A;  . TUBAL LIGATION    :   Current Facility-Administered Medications:  .  0.9 %  sodium chloride infusion, , Intravenous, Continuous, Rai, Ripudeep K, MD, Last Rate: 100 mL/hr at 03/19/17 1459 .  acetaminophen (TYLENOL) tablet 650 mg, 650 mg, Oral, Q6H PRN, Lonia Blood, MD, 650 mg at 03/19/17 1338 .  ALPRAZolam Prudy Feeler) tablet 0.5 mg, 0.5 mg, Oral, QID PRN,  Lonia Blood, MD, 0.5 mg at 03/19/17 0845 .  buprenorphine-naloxone (SUBOXONE) 2-0.5 mg per SL tablet 2 tablet, 2 tablet, Sublingual, Daily, Lonia Blood, MD, 2 tablet at 03/19/17 0844 .  ceFEPIme (MAXIPIME) 2 g in sodium chloride 0.9 % 100 mL IVPB, 2 g, Intravenous, Q8H, Rai, Ripudeep K, MD, Last Rate: 200 mL/hr at 03/19/17 1438, 2 g at 03/19/17 1438 .  cyclobenzaprine (FLEXERIL) tablet 10 mg, 10 mg, Oral, TID PRN, Lonia Blood, MD, 10 mg at 03/19/17 0536 .  DULoxetine (CYMBALTA) DR capsule 60 mg, 60 mg, Oral, BID, Arrien, York Ram, MD, 60 mg at 03/19/17 0844 .  enoxaparin (LOVENOX) injection 40 mg, 40 mg, Subcutaneous, Q24H, Carron Curie, MD, 40 mg at 03/18/17 2133 .  ferrous sulfate tablet 325 mg, 325 mg, Oral, TID WC, Arrien, York Ram, MD, 325 mg at 03/19/17 1150 .  gadobenate dimeglumine (MULTIHANCE) injection 15 mL, 15 mL, Intravenous, Once, Veryl Speak, FNP .  magic mouthwash, 5 mL, Oral, TID PRN, Richarda Overlie, MD, 5 mL at 03/19/17 0353 .  multivitamin with minerals tablet 1 tablet, 1 tablet, Oral, Daily, Carron Curie, MD, 1 tablet at 03/19/17 0844 .  OLANZapine (ZYPREXA) tablet 5 mg, 5 mg, Oral, q morning - 10a, Lonia Blood, MD, 5 mg at 03/19/17 0845 .  ondansetron (ZOFRAN) tablet 4 mg, 4 mg, Oral, Q6H PRN, 4 mg at 03/19/17 0845 **OR** ondansetron (ZOFRAN) injection 4 mg, 4 mg, Intravenous, Q6H PRN, Carron Curie, MD .  oxyCODONE (Oxy IR/ROXICODONE) immediate release tablet 5 mg, 5 mg, Oral, Q6H PRN, Rai, Ripudeep K, MD .  polyethylene glycol (MIRALAX / GLYCOLAX) packet 17 g, 17 g, Oral, Daily, Lonia Blood, MD, 17 g at 03/06/17 0840 .  senna-docusate (Senokot-S) tablet 1 tablet, 1 tablet, Oral, BID, Lonia Blood, MD, 1 tablet at 03/19/17 0845 .  sodium chloride flush (NS) 0.9 % injection 10-40 mL, 10-40 mL, Intracatheter, PRN, Briant Cedar, MD, 10 mL at 02/27/17 0408 .  sodium chloride flush (NS) 0.9 % injection 10-40 mL, 10-40  mL, Intracatheter, Q12H, Arrien, York Ram, MD, 10 mL at 03/18/17 2134 .  sodium chloride flush (NS) 0.9 % injection 10-40 mL, 10-40 mL, Intracatheter, PRN, Arrien, York Ram, MD, 10 mL at 03/16/17 0547 .  zolpidem (AMBIEN) tablet 5 mg, 5 mg, Oral, QHS PRN, Rai, Ripudeep K, MD, 5 mg at 03/18/17 2133:  Allergies  Allergen Reactions  . Azithromycin Nausea And Vomiting    ABDOMINAL PAIN  . Nsaids     UNSPECIFIED REACTION    . Hydrocodone Nausea And Vomiting  . Penicillins Nausea And Vomiting     Has patient had a PCN reaction causing immediate rash, facial/tongue/throat swelling, SOB or lightheadedness with hypotension: No Has patient had a PCN reaction causing severe rash involving mucus membranes or skin necrosis: No Has patient had a PCN reaction that required hospitalization No Has patient had a PCN reaction occurring within the last 10 years: No If all of the above answers are "NO", then may proceed with Cephalosporin use.   Marland Kitchen  Prednisone Nausea Only and Other (See Comments)    Can take shot, but pill form "caused stomach pain , nausea"  :  Family History  Problem Relation Age of Onset  . Ulcerative colitis Paternal Grandmother   . Cancer Paternal Grandmother   . Colon polyps Paternal Grandfather   . Cancer Paternal Grandfather        thyroid  . Diabetes Father   . Heart disease Father   . Kidney disease Father   . Hypertension Father   :  Social History   Socioeconomic History  . Marital status: Single    Spouse name: Not on file  . Number of children: 2  . Years of education: Not on file  . Highest education level: Not on file  Social Needs  . Financial resource strain: Not on file  . Food insecurity - worry: Not on file  . Food insecurity - inability: Not on file  . Transportation needs - medical: Not on file  . Transportation needs - non-medical: Not on file  Occupational History  . Occupation: CNA  Tobacco Use  . Smoking status: Current Every  Day Smoker    Packs/day: 2.00    Years: 10.00    Pack years: 20.00    Types: Cigarettes  . Smokeless tobacco: Never Used  Substance and Sexual Activity  . Alcohol use: No  . Drug use: No    Comment: pt denies 11/30/15  . Sexual activity: No    Birth control/protection: Surgical  Other Topics Concern  . Not on file  Social History Narrative   Daily caffeine   :  Pertinent items are noted in HPI.  Exam: Blood pressure 101/72, pulse (!) 139, temperature (!) 102.7 F (39.3 C), resp. rate (!) 25, height 5\' 7"  (1.702 m), weight 171 lb 14.4 oz (78 kg), last menstrual period 02/09/2017, SpO2 97 %. General appearance: alert and cooperative appeared without distress. Head: atraumatic without any abnormalities.  Eyes: conjunctivae/corneas clear. PERRL.  Sclera anicteric. Throat: lips, mucosa, and tongue normal; without oral thrush or ulcers. Resp: clear to auscultation bilaterally without rhonchi, wheezes or dullness to percussion. Cardio: regular rate and rhythm, S1, S2 normal, no murmur, click, rub or gallop GI: soft, non-tender; bowel sounds normal; no masses,  no organomegaly Skin: No rashes or lesions.  No erythema or induration noted around the PICC line site. Lymph nodes: Cervical, supraclavicular, and axillary nodes normal. Neurologic: Grossly normal without any motor, sensory or deep tendon reflexes.   Musculoskeletal: No joint deformity or effusion.  Recent Labs    03/18/17 0330 03/19/17 0426  WBC 1.8* 1.2*  HGB 9.1* 9.3*  HCT 28.1* 29.0*  PLT 340 369   Recent Labs    03/18/17 0330 03/19/17 0426  NA 139 136  K 3.9 3.9  CL 107 106  CO2 23 19*  GLUCOSE 98 94  BUN 17 15  CREATININE 0.77 0.80  CALCIUM 8.7* 8.8*   Peripheral smear was personally reviewed today: She has very little to no white cells noted.  No evidence of dysplasia or blasts.  Red cells appeared normal.  Toxic granulations noted.   Assessment and Plan:   32 year old woman with the following  issues:  1.  Leukocytopenia with neutropenia: This was detected acutely in the last few days and her total white cell count on 03/19/2017 was 1.2 with very little to no neutrophils noted on her peripheral smear.  She has been hospitalized for the last month for MSSA bacteremia and has been  on nafcillin for the last 3 weeks.  She also developed signs and symptoms of sepsis in the last 24 hours.  The differential diagnosis was discussed with the patient today as well as the treating physician requested the consult.  This is most likely related to acute sepsis versus a antibiotic effect.  Nafcillin is known to cause neutropenia although the timing of this incident does not support this diagnosis.  Likely etiology would be acute sepsis and treating the underlying condition as you are doing with be the appropriate next step.  Infectious disease involvement with switching her antibiotics is helpful at this time.  I agree with stopping nafcillin at this time despite the low possibility of this medication contributing.  I do not suggest using growth factor support at this time unless she develops hemodynamic instability and her sepsis is not resolving.  2.  Sepsis: Likely related to her PICC line that has been inserted on admission.  Management is per infectious disease and the primary team.  I will continue to follow her labs and add any recommendations  55  minutes was spent with the patient face-to-face today.  More than 50% of time was dedicated to patient counseling, education and reviewing her medical records for the last 4 weeks.

## 2017-03-19 NOTE — Progress Notes (Signed)
Responded to bedside after call from CN for hypotension.  Bedside RN reports that BP had dropped to 90/40 and that the pt was febrile.  The pt's primary MD was contacted prior to my arrival and placed order for 1 L fluid bolus, maintenance fluids, labs, cxr, and abx.  On exam, the pt is alert, follows commands, oriented x 3, flat affect, states she doesn't feel well.  Fever was > 102, pt had received tylenol prior to my arrival.  HR was sustained in 130-140s, ST on monitor.  BP improved with MAP now > 65.  RR controlled, even and unlabored.  Lungs were CTA bilaterally.  Pt denies pain.  PICC line in place R upper arm, site appears intact, clean and dry.  No redness or outward signs of infection.    Latest documented VS:  Temp 101.5, HR 124, BP 101/68 (79) at 1541, SpO2 98% on RA.  Pt was placed on our radar.  Lactic acid and PCT results pending.  Blood cultures are in progress.

## 2017-03-19 NOTE — Progress Notes (Signed)
Pt temp 102.1 MD notified and  verbal order for Prn ibuprofen 800 mg Q8 and Protonix 40 mg daily given by Dr Isidoro Donningai

## 2017-03-20 ENCOUNTER — Inpatient Hospital Stay (HOSPITAL_COMMUNITY): Payer: Medicaid Other

## 2017-03-20 LAB — CBC WITH DIFFERENTIAL/PLATELET
BASOS PCT: 0 %
Basophils Absolute: 0 10*3/uL (ref 0.0–0.1)
EOS ABS: 0 10*3/uL (ref 0.0–0.7)
Eosinophils Relative: 0 %
HCT: 23.9 % — ABNORMAL LOW (ref 36.0–46.0)
Hemoglobin: 7.8 g/dL — ABNORMAL LOW (ref 12.0–15.0)
LYMPHS ABS: 0.4 10*3/uL — AB (ref 0.7–4.0)
Lymphocytes Relative: 95 %
MCH: 29.7 pg (ref 26.0–34.0)
MCHC: 32.6 g/dL (ref 30.0–36.0)
MCV: 90.9 fL (ref 78.0–100.0)
MONO ABS: 0 10*3/uL — AB (ref 0.1–1.0)
Monocytes Relative: 0 %
Neutro Abs: 0 10*3/uL — ABNORMAL LOW (ref 1.7–7.7)
Neutrophils Relative %: 5 %
Platelets: 251 10*3/uL (ref 150–400)
RBC: 2.63 MIL/uL — ABNORMAL LOW (ref 3.87–5.11)
RDW: 19.5 % — AB (ref 11.5–15.5)
WBC: 0.4 10*3/uL — AB (ref 4.0–10.5)

## 2017-03-20 LAB — BASIC METABOLIC PANEL
Anion gap: 10 (ref 5–15)
BUN: 16 mg/dL (ref 6–20)
CALCIUM: 7.8 mg/dL — AB (ref 8.9–10.3)
CO2: 17 mmol/L — AB (ref 22–32)
CREATININE: 0.94 mg/dL (ref 0.44–1.00)
Chloride: 106 mmol/L (ref 101–111)
GFR calc Af Amer: >60 mL/min (ref >60)
GFR calc non Af Amer: >60 mL/min (ref >60)
GLUCOSE: 104 mg/dL — AB (ref 65–99)
Potassium: 3.5 mmol/L (ref 3.5–5.1)
Sodium: 133 mmol/L — ABNORMAL LOW (ref 135–145)

## 2017-03-20 LAB — RAPID STREP SCREEN (MED CTR MEBANE ONLY): STREPTOCOCCUS, GROUP A SCREEN (DIRECT): NEGATIVE

## 2017-03-20 MED ORDER — SODIUM CHLORIDE 0.9 % IV BOLUS (SEPSIS)
500.0000 mL | Freq: Once | INTRAVENOUS | Status: AC
  Administered 2017-03-20: 500 mL via INTRAVENOUS

## 2017-03-20 MED ORDER — ACETAMINOPHEN 10 MG/ML IV SOLN
1000.0000 mg | Freq: Once | INTRAVENOUS | Status: AC
  Administered 2017-03-20: 1000 mg via INTRAVENOUS
  Filled 2017-03-20: qty 100

## 2017-03-20 MED ORDER — IOPAMIDOL (ISOVUE-300) INJECTION 61%
INTRAVENOUS | Status: AC
  Administered 2017-03-20: 75 mL
  Filled 2017-03-20: qty 75

## 2017-03-20 MED ORDER — OXYCODONE HCL 5 MG PO TABS
5.0000 mg | ORAL_TABLET | ORAL | Status: DC | PRN
  Administered 2017-03-20 – 2017-03-25 (×24): 5 mg via ORAL
  Filled 2017-03-20 (×25): qty 1

## 2017-03-20 NOTE — Progress Notes (Signed)
PROGRESS NOTE    [name]  RUE:[id number] DOB: [birth year] DOA: 02/13/2017 PCP: Pearson Grippe, MD   Brief Narrative:   32 year old female with past medical history significant for chronic anemia and IV drug use. Patient presented to the emergency room on 02/13/2017 with complaints of fevers and chills and generalized weakness for 2 days prior to presentation. Patient was noted to have erythema and petechiae on her left hand and lower extremities and found to have MSSA bacteremia/sepsis. 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 Patient was not felt to be a candidate for valve replacement as per CT surgery due to her history of ongoing drug use. Patient was also found to have right sternoclavicular septic arthritis. Patient was seen and evaluated by infectious disease. Patient has been switched from IV nafcillin to cefepime, and patient with complete 6-8 weeks of IV antibiotics therapy (antibiotic therapy suspected to and on 04 /2/19). PICC line has been placed and will be left in per ID. Patient is not able to go home with PICC line given patient's illicit drug use habit. No facility is willing to accept patient. The plan is for the patient to remain in the hospital and complete the course of IV antibiotics.  Patient is now noted to be pancytopenic with worsening neutropenia for which hematology is following with no recommendations for growth factor support at this time.   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  -Right sternoclavicular septic arthritis all secondary to IV drug use and septic Staphylococcus infection -Repeat blood cultures 2/6 has been negative so far. ID had concern regarding risk of embolization across the PFO to CNS or kidneys in the left sided circulation that could occur even if vegetation is sterile. Dr. Tyrone Sage does not recommend TVR in context of her ongoing drug use. -Patient will complete 6-8 weeks of IV antibiotics, currently on Cefepime, (this will be completed on 04/15/2017), will remain in the hospital until then -Dr Algis Liming recommended repeat 2-D echo and likely repeat TEE to assess for vegetations in next 4 weeks, CT of the clavicle by 3/15. Follow MRI of sternum, ordered for 3/15 -CT of the neck with and without contrast ordered today 3/7 for evaluation of swelling to the left submandibular region. -Transfer to SDU today for closer monitoring due to borderline hemodynamics.  Neutropenia, SIRS-worsening - Noted WBC count low today 0.4 with toxic granulation and atypical lymphocytes, WBC count trending down in the last few days. - Discussed with hematology, Dr Clelia Croft, unlikely due to antibiotics as patient has been on nafcillin since 02/21/17. No new medication started recently which could cause neutropenia. Per hematology, likely due to bacteremia, viral illness, infectious or reactive. No need for colony stim factor Neupogen etc at this time unless ANC less than 500 with sepsis. Treat the underlying cause. -placed on aggressive IV fluid  hydration, obtain repeat blood cultures, lactic acid, pro-calcitonin - influenza panel negative - PICC line doesn't look infected, Placed on IV cefepime, discussed with ID, Dr. Luciana Axe who plans to keep line in  for now.   Anemia of chronic disease -Hemoglobin has been stable  -H&H stable  Thrombocytosis -likely reactive  Pelvic pain Resolved  Cellulitis of left hand - Complete antibiotics as #1  Weakness of both lower extremities - PT following  Pain of right clavicle/ right shoulder -Is likely related to septic arthritis of the right sternoclavicular joint - Right shoulder x-ray showed no acute osseus abnormalities   IVDU (intravenous drug user) -on suboxone.  Septic arthritis of right sternoclavicular joint (HCC) -Complete course of antibiotics.  PFO (patent foramen ovale)  Tricuspid valve vegetation  Depression -On Cymbalta and Zyprexa  Chronic diastolic heart failure -compensated, received Lasix  - BNP slightly elevated at 222 - Follow volume status closely  B/l LE edema -due to severe protein calorie malnutrition albumin 1.6.  - Received Lasix 40 mg 2 doses on 3/1  Code Status: full CODE STATUS DVT Prophylaxis:  Lovenox  Family Communication: Discussed in detail with the patient, all imaging results, lab results explained to the patient   Disposition Plan: to stay inpatient till completes antibiotics by 4/2  Time Spent in minutes  35 minutes  Procedures:  2-D echo 2/1 showed grade 1 diastolic dysfunction, mild tricuspid regurgitation TEE 2/7 vegetation noted on tricuspid valve with large PFO  Consultants:    infectious disease Cardiology Heme/onc CT surgery  Antimicrobials:  IV Ancef 2/-2/8 Zyvox 2/5-2/6, 2/17-/18 IV nafcillin 2/8-3/6 IV cefepime 3/6-> IV vancomycin and Zosyn 1/31-2/1   Subjective: Patient seen and evaluated today with left-sided swelling to her neck. She has been somewhat hypotensive and tachycardic overnight and has received multiple fluid boluses.   Objective: Vitals:   03/20/17 1125 03/20/17 1128 03/20/17 1135 03/20/17 1225  BP: (!) 118/92   106/64  Pulse: (!) 135 (!) 140  (!) 127  Resp:     19  Temp:   98.7 F (37.1 C) 100.3 F (37.9 C)  TempSrc:   Oral Oral  SpO2: 100% 99%  100%  Weight:      Height:        Intake/Output Summary (Last 24 hours) at 03/20/2017 1353 Last data filed at 03/20/2017 0856 Gross per 24 hour  Intake 4123.33 ml  Output 1200 ml  Net 2923.33 ml   Filed Weights   03/17/17 0500 03/18/17 0407 03/20/17 0232  Weight: 76.2 kg (167 lb 15.9 oz) 78 kg (171 lb 14.4 oz) 78 kg (171 lb 14.4 oz)    Examination:  General exam: Appears calm and comfortable  Respiratory system: Clear to auscultation. Respiratory effort normal. Cardiovascular system: S1 & S2 heard, RRR. No JVD, murmurs, rubs, gallops or clicks. No pedal edema. Gastrointestinal system: Abdomen is nondistended, soft and nontender. No organomegaly or masses felt. Normal bowel sounds heard. Central nervous system: Alert and oriented. No focal neurological deficits. Extremities: Symmetric 5 x 5 power. Skin: No rashes, lesions or ulcers Psychiatry: Judgement and insight appear normal. Mood & affect appropriate.     Data Reviewed: I have personally reviewed following labs and imaging studies  CBC: Recent Labs  Lab 03/15/17 0426 03/17/17 0412 03/18/17 0330 03/19/17 0426 03/19/17 1048 03/20/17 0428  WBC 3.8* 2.5* 1.8* 1.2*  --  0.4*  NEUTROABS  --   --   --   --  0.0* 0.0*  HGB 8.7* 9.1* 9.1* 9.3*  --  7.8*  HCT 27.1* 28.5* 28.1* 29.0*  --  23.9*  MCV 90.9 91.3 90.9 90.6  --  90.9  PLT 308 354 340 369  --  251   Basic Metabolic Panel: Recent Labs  Lab 03/15/17 0426 03/17/17 0412 03/18/17 0330 03/19/17 0426 03/20/17 0428  NA 139 138 139 136 133*  K 4.0 3.9 3.9 3.9 3.5  CL 105 109 107 106 106  CO2 25 22 23  19* 17*  GLUCOSE 103* 106* 98 94 104*  BUN 16 16 17 15 16   CREATININE 0.81 0.78 0.77 0.80 0.94  CALCIUM 8.7* 8.7* 8.7* 8.8* 7.8*   GFR: Estimated Creatinine Clearance: 92.5 mL/min (by C-G formula based on SCr of 0.94 mg/dL). Liver Function Tests: No results for input(s):  AST, ALT, ALKPHOS, BILITOT, PROT, ALBUMIN in the last 168 hours. No results for input(s): LIPASE, AMYLASE in the last 168 hours. No results for input(s): AMMONIA in the last 168 hours. Coagulation Profile: No results for input(s): INR, PROTIME in the last 168 hours. Cardiac Enzymes: No results for input(s): CKTOTAL, CKMB, CKMBINDEX, TROPONINI in the last 168 hours. BNP (last 3 results) No results for input(s): PROBNP in the last 8760 hours. HbA1C: No results for input(s): HGBA1C in the last 72 hours. CBG: No results for input(s): GLUCAP in the last 168 hours. Lipid Profile: No results for input(s): CHOL, HDL, LDLCALC, TRIG, CHOLHDL, LDLDIRECT in the last 72 hours. Thyroid Function Tests: No results for input(s): TSH, T4TOTAL, FREET4, T3FREE, THYROIDAB in the last 72 hours. Anemia Panel: No results for input(s): VITAMINB12, FOLATE, FERRITIN, TIBC, IRON, RETICCTPCT in the last 72 hours. Sepsis Labs: Recent Labs  Lab 03/19/17 1536 03/19/17 1541  PROCALCITON 2.49  --   LATICACIDVEN  --  1.0    Recent Results (from the past 240 hour(s))  Culture, blood (Routine X 2) w Reflex to ID Panel     Status: None (Preliminary result)   Collection Time: 03/19/17 10:40 AM  Result Value Ref Range Status   Specimen Description BLOOD LEFT HAND  Final   Special Requests IN PEDIATRIC BOTTLE Blood Culture adequate volume  Final   Culture   Final    NO GROWTH < 24 HOURS Performed at [facility], [address] [place], [place] [address]    Report Status PENDING  Incomplete  Culture, blood (Routine X 2) w Reflex to ID Panel     Status: None (Preliminary result)   Collection Time: 03/19/17 10:50 AM  Result Value Ref Range Status   Specimen Description BLOOD LEFT ARM  Final   Special Requests IN PEDIATRIC BOTTLE Blood Culture adequate volume  Final   Culture   Final    NO GROWTH < 24 HOURS Performed at [facility], [address] [place], [place] [address]    Report Status  PENDING  Incomplete  Rapid Strep Screen (Not at [facility])     Status: None   Collection Time: 03/20/17  8:20 AM  Result Value Ref Range Status   Streptococcus, Group A Screen (Direct) NEGATIVE NEGATIVE Final    Comment: (NOTE) A Rapid Antigen test may result negative if the antigen level in the sample is below the detection level of this test. The FDA has not cleared this test as a stand-alone test therefore the rapid antigen negative result has reflexed to a Group A Strep culture.          Radiology Studies: Dg Chest Port 1 View  Result Date: 03/19/2017 CLINICAL DATA:  Shortness of breath and leg pain EXAM: PORTABLE CHEST 1 VIEW COMPARISON:  02/22/2017 FINDINGS: Cardiac shadow is within normal limits. Right-sided PICC line is now seen in satisfactory position. The lungs are well aerated bilaterally without focal infiltrate or sizable effusion. No bony abnormality is noted. IMPRESSION: No acute abnormality noted. Electronically Signed   By: Alcide Clever M.D.   On: 03/19/2017 17:42        Scheduled Meds: . buprenorphine-naloxone  2 tablet Sublingual Daily  . DULoxetine  60 mg Oral BID  . enoxaparin (LOVENOX) injection  40 mg Subcutaneous Q24H  . ferrous sulfate  325 mg Oral TID WC  . gadobenate dimeglumine  15 mL Intravenous Once  . multivitamin with minerals  1 tablet Oral Daily  . OLANZapine  5 mg Oral q morning - 10a  . pantoprazole  40 mg Oral Daily  . polyethylene glycol  17 g Oral Daily  . senna-docusate  1 tablet Oral BID   Continuous Infusions: . sodium chloride 100 mL/hr at 03/20/17 0102  . ceFEPime (MAXIPIME) IV Stopped (03/20/17 0543)     LOS: 35 days    Time spent: 30 minutes    Karita Dralle Hoover Brunette, DO [facility] Pager [phone]  If 7PM-7AM, please contact night-coverage [email or web address] Password [email or web address] 03/20/2017, 1:53 PM

## 2017-03-20 NOTE — Progress Notes (Signed)
Patient temp 102F, tylenol given. Temp rechecked reading 103F; ibuprofen given and NP Craige CottaKirby notified.  Awaiting orders and continuing to monitor patient.

## 2017-03-20 NOTE — Progress Notes (Signed)
Events in the last 24 hours noted. Patient clinically appears to be stable with switched from nafcillin to cefepime.  Her white cell count continues to be low at 0.4 with very little neutrophils on her smear.  The differential diagnosis includes lymphocytopenia due to sepsis versus medication. Nafcillin has been discontinued and currently on cefepime. The rest of her medication was reviewed and felt unlikely could be contributing. Zyprexa and Cymbalta for possibilities but also considered less likely.  For the time being I recommended supportive management as you're doing with treatment of underlying potential infection and follow her counts regularly. I see no need for growth factor support at this time.

## 2017-03-20 NOTE — Progress Notes (Signed)
Blood pressure 95/67, MAP 77, pulse (!) 135, temperature 100.3 F (37.9 C), temperature source Oral, resp. rate 19, height 5\' 7"  (1.702 m), weight 78 kg (171 lb 14.4 oz), last menstrual period 02/09/2017, SpO2 91 %.  Provider made aware as patient is tearful and states "I just don't feel well"

## 2017-03-20 NOTE — Progress Notes (Signed)
[facility]  Date of Admission:  02/13/2017              ASSESSMENT/PLAN  Fevers - Continues to have fevers up to 103 and tachycardia consistent with SIRS. Based on continued fevers it is reasonable to assume the PICC as the source and would be prudent to have it removed. Once removed if continues to have fevers would need additional work up.   1. Remove PICC and insert PIV. Ensure IV access prior to removing PICC.  2. Continue cefepime and to monitor cultures. 3. If not improved consider CT scan of the neck.  4. Monitor vitals.  Bacteremia - Repeat blood cultures are in process from 3/6. Continue to monitor.   Leukopenia - Per hematology most likely related to infection. Nafcillin has been changed to Cefepime if antibiotic was contributing factor. Continue to monitor WBC.   Principal Problem:   MSSA bacteremia Active Problems:   Sepsis affecting skin (HCC)   Cellulitis of left hand   Staphylococcus aureus bacteremia with sepsis (HCC)   Weakness of both lower extremities   Pain of right clavicle   IVDU (intravenous drug user)   Septic arthritis of right sternoclavicular joint (HCC)   PFO (patent foramen ovale)   Tricuspid valve vegetation   Chronic diastolic CHF (congestive heart failure) (HCC)   . buprenorphine-naloxone  2 tablet Sublingual Daily  . DULoxetine  60 mg Oral BID  . enoxaparin (LOVENOX) injection  40 mg Subcutaneous Q24H  . ferrous sulfate  325 mg Oral TID WC  . gadobenate dimeglumine  15 mL Intravenous Once  . multivitamin with minerals  1 tablet Oral Daily  . OLANZapine  5 mg Oral q morning - 10a  . pantoprazole  40 mg Oral Daily  . polyethylene glycol  17 g Oral Daily  . senna-docusate  1 tablet Oral BID  . sodium chloride flush  10-40 mL Intracatheter Q12H    SUBJECTIVE:  Continues to have fevers overnight up to 103 that was treated with Tylenol. Continues with leukopenia now down to 0.4. Seen by hematology with the  differential for the leukopenia being sepsis/sirs versus antibiotic effect favoring the sepsis/sirs. Has noted increased lymphadenopathy of the left side of her neck which was tested for strep and was negative. She is currently receiving cefepime.  Allergies  Allergen Reactions  . Azithromycin Nausea And Vomiting    ABDOMINAL PAIN  . Nsaids     UNSPECIFIED REACTION    . Hydrocodone Nausea And Vomiting  . Penicillins Nausea And Vomiting     Has patient had a PCN reaction causing immediate rash, facial/tongue/throat swelling, SOB or lightheadedness with hypotension: No Has patient had a PCN reaction causing severe rash involving mucus membranes or skin necrosis: No Has patient had a PCN reaction that required hospitalization No Has patient had a PCN reaction occurring within the last 10 years: No If all of the above answers are "NO", then may proceed with Cephalosporin use.   . Prednisone Nausea Only and Other (See Comments)    Can take shot, but pill form "caused stomach pain , nausea"     Review of Systems: Review of Systems  Constitutional: Positive for chills and fever. Negative for weight loss.  Respiratory: Negative for cough, shortness of breath and wheezing.   Cardiovascular: Negative for chest pain.  Gastrointestinal: Negative for constipation, diarrhea, nausea and vomiting.  Genitourinary: Negative for dysuria, frequency and urgency.  Skin: Negative for rash.  Neurological: Negative  for focal weakness, weakness and headaches.      OBJECTIVE: Vitals:   03/20/17 0450 03/20/17 0510 03/20/17 0551 03/20/17 0636  BP: 106/76     Pulse:      Resp: (!) 27     Temp:  (!) 102.9 F (39.4 C) (!) 103 F (39.4 C) (!) 100.5 F (38.1 C)  TempSrc:  Oral Oral Oral  SpO2:      Weight:      Height:       Body mass index is 26.92 kg/m.  Physical Exam  Constitutional: She is well-developed, well-nourished, and in no distress. No distress.  Cardiovascular: Regular rhythm.  Tachycardia present.  Murmur heard.  Systolic murmur is present. Pulmonary/Chest: Breath sounds normal. She has no decreased breath sounds. She has no wheezes. She has no rhonchi. She has no rales.  Psychiatric: Her mood appears anxious.    Lab Results Lab Results  Component Value Date   WBC 0.4 (LL) 03/20/2017   HGB 7.8 (L) 03/20/2017   HCT 23.9 (L) 03/20/2017   MCV 90.9 03/20/2017   PLT 251 03/20/2017    Lab Results  Component Value Date   CREATININE 0.94 03/20/2017   BUN 16 03/20/2017   NA 133 (L) 03/20/2017   K 3.5 03/20/2017   CL 106 03/20/2017   CO2 17 (L) 03/20/2017    Lab Results  Component Value Date   ALT 22 02/18/2017   AST 50 (H) 02/18/2017   ALKPHOS 173 (H) 02/18/2017   BILITOT 1.4 (H) 02/18/2017     Microbiology: Recent Results (from the past 240 hour(s))  Rapid Strep Screen (Not at [facility])     Status: None   Collection Time: 03/20/17  8:20 AM  Result Value Ref Range Status   Streptococcus, Group A Screen (Direct) NEGATIVE NEGATIVE Final    Comment: (NOTE) A Rapid Antigen test may result negative if the antigen level in the sample is below the detection level of this test. The FDA has not cleared this test as a stand-alone test therefore the rapid antigen negative result has reflexed to a Group A Strep culture.      Marcos Eke, NP [facility] [phone] Pager  03/20/2017  10:43 AM

## 2017-03-20 NOTE — Progress Notes (Signed)
Patient placed on protective precautions due to neutropenia. Education provided and patient verbalized understanding.

## 2017-03-21 ENCOUNTER — Inpatient Hospital Stay (HOSPITAL_COMMUNITY): Payer: Medicaid Other

## 2017-03-21 ENCOUNTER — Inpatient Hospital Stay: Payer: Self-pay

## 2017-03-21 ENCOUNTER — Encounter (HOSPITAL_COMMUNITY): Payer: Self-pay | Admitting: Physician Assistant

## 2017-03-21 DIAGNOSIS — K921 Melena: Secondary | ICD-10-CM

## 2017-03-21 DIAGNOSIS — B9562 Methicillin resistant Staphylococcus aureus infection as the cause of diseases classified elsewhere: Secondary | ICD-10-CM

## 2017-03-21 DIAGNOSIS — K922 Gastrointestinal hemorrhage, unspecified: Secondary | ICD-10-CM

## 2017-03-21 DIAGNOSIS — Z978 Presence of other specified devices: Secondary | ICD-10-CM

## 2017-03-21 DIAGNOSIS — D649 Anemia, unspecified: Secondary | ICD-10-CM

## 2017-03-21 DIAGNOSIS — R011 Cardiac murmur, unspecified: Secondary | ICD-10-CM

## 2017-03-21 DIAGNOSIS — K760 Fatty (change of) liver, not elsewhere classified: Secondary | ICD-10-CM

## 2017-03-21 DIAGNOSIS — R319 Hematuria, unspecified: Secondary | ICD-10-CM

## 2017-03-21 DIAGNOSIS — Z9889 Other specified postprocedural states: Secondary | ICD-10-CM

## 2017-03-21 LAB — BASIC METABOLIC PANEL
Anion gap: 9 (ref 5–15)
BUN: 11 mg/dL (ref 6–20)
CALCIUM: 8 mg/dL — AB (ref 8.9–10.3)
CO2: 17 mmol/L — AB (ref 22–32)
CREATININE: 0.82 mg/dL (ref 0.44–1.00)
Chloride: 109 mmol/L (ref 101–111)
Glucose, Bld: 78 mg/dL (ref 65–99)
Potassium: 3.2 mmol/L — ABNORMAL LOW (ref 3.5–5.1)
Sodium: 135 mmol/L (ref 135–145)

## 2017-03-21 LAB — RAPID URINE DRUG SCREEN, HOSP PERFORMED
Amphetamines: NOT DETECTED
Barbiturates: NOT DETECTED
Benzodiazepines: POSITIVE — AB
Cocaine: NOT DETECTED
Opiates: NOT DETECTED
Tetrahydrocannabinol: NOT DETECTED

## 2017-03-21 LAB — CBC WITH DIFFERENTIAL/PLATELET
BASOS ABS: 0 10*3/uL (ref 0.0–0.1)
Basophils Relative: 0 %
EOS ABS: 0 10*3/uL (ref 0.0–0.7)
Eosinophils Relative: 0 %
HCT: 19.3 % — ABNORMAL LOW (ref 36.0–46.0)
Hemoglobin: 6.4 g/dL — CL (ref 12.0–15.0)
LYMPHS ABS: 0.6 10*3/uL — AB (ref 0.7–4.0)
Lymphocytes Relative: 98 %
MCH: 29.1 pg (ref 26.0–34.0)
MCHC: 33.2 g/dL (ref 30.0–36.0)
MCV: 87.7 fL (ref 78.0–100.0)
Monocytes Absolute: 0 10*3/uL — ABNORMAL LOW (ref 0.1–1.0)
Monocytes Relative: 0 %
NEUTROS PCT: 2 %
Neutro Abs: 0 10*3/uL — ABNORMAL LOW (ref 1.7–7.7)
PLATELETS: 158 10*3/uL (ref 150–400)
RBC: 2.2 MIL/uL — AB (ref 3.87–5.11)
RDW: 19.4 % — AB (ref 11.5–15.5)
WBC: 0.6 10*3/uL — AB (ref 4.0–10.5)

## 2017-03-21 LAB — HEMOGLOBIN AND HEMATOCRIT, BLOOD
HCT: 23 % — ABNORMAL LOW (ref 36.0–46.0)
Hemoglobin: 8 g/dL — ABNORMAL LOW (ref 12.0–15.0)

## 2017-03-21 LAB — PATHOLOGIST SMEAR REVIEW

## 2017-03-21 LAB — PREPARE RBC (CROSSMATCH)

## 2017-03-21 MED ORDER — POTASSIUM CHLORIDE CRYS ER 20 MEQ PO TBCR
40.0000 meq | EXTENDED_RELEASE_TABLET | Freq: Once | ORAL | Status: AC
  Administered 2017-03-21: 40 meq via ORAL
  Filled 2017-03-21: qty 2

## 2017-03-21 MED ORDER — ACETAMINOPHEN 325 MG PO TABS
325.0000 mg | ORAL_TABLET | Freq: Once | ORAL | Status: AC
  Administered 2017-03-21: 325 mg via ORAL
  Filled 2017-03-21: qty 1

## 2017-03-21 MED ORDER — PANTOPRAZOLE SODIUM 40 MG IV SOLR
40.0000 mg | Freq: Two times a day (BID) | INTRAVENOUS | Status: DC
  Administered 2017-03-21 – 2017-03-25 (×9): 40 mg via INTRAVENOUS
  Filled 2017-03-21 (×9): qty 40

## 2017-03-21 MED ORDER — METOPROLOL TARTRATE 5 MG/5ML IV SOLN
2.5000 mg | Freq: Once | INTRAVENOUS | Status: AC
  Administered 2017-03-21: 2.5 mg via INTRAVENOUS
  Filled 2017-03-21: qty 5

## 2017-03-21 MED ORDER — SODIUM CHLORIDE 0.9 % IV BOLUS (SEPSIS)
1000.0000 mL | Freq: Once | INTRAVENOUS | Status: AC
  Administered 2017-03-21: 1000 mL via INTRAVENOUS

## 2017-03-21 MED ORDER — SODIUM CHLORIDE 0.9% FLUSH
10.0000 mL | INTRAVENOUS | Status: DC | PRN
  Administered 2017-03-25 – 2017-04-14 (×9): 10 mL
  Filled 2017-03-21 (×9): qty 40

## 2017-03-21 MED ORDER — SODIUM CHLORIDE 0.9 % IV SOLN
Freq: Once | INTRAVENOUS | Status: AC
  Administered 2017-04-11: 08:00:00 via INTRAVENOUS

## 2017-03-21 MED ORDER — NICOTINE 7 MG/24HR TD PT24
7.0000 mg | MEDICATED_PATCH | TRANSDERMAL | Status: DC
  Administered 2017-03-21 – 2017-04-13 (×18): 7 mg via TRANSDERMAL
  Filled 2017-03-21 (×23): qty 1

## 2017-03-21 MED ORDER — SODIUM CHLORIDE 0.9 % IV BOLUS (SEPSIS)
500.0000 mL | Freq: Once | INTRAVENOUS | Status: AC
  Administered 2017-03-21: 500 mL via INTRAVENOUS

## 2017-03-21 NOTE — Progress Notes (Signed)
Several attempts by lab to draw blood, lab tech requesting order to draw labs on foot, pharmacy called to check on DIC draw dated for tomorrow, paged provider about both.

## 2017-03-21 NOTE — Progress Notes (Signed)
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  Patient was not felt to be a candidate for valve replacement at this time due to her ongoing drug use.  She was also noted to have right sternoclavicular septic arthritis.  She was initially maintained on IV nafcillin to complete a total of 6-8 weeks of IV antibiotics as she was not safe to discharge home with PICC line and no facility was willing to accept the patient.  Her course has been complicated by an episode on 03/19/2017 of sepsis physiology with worsening pancytopenia and neutropenia for which hematology was consulted and is following as well as worsening cellulitis and myositis of the left neck and shoulder area.  He is also developed progressive anemia has had small streaks of blood in her stool.    Assessment & Plan:   Principal Problem:   MSSA bacteremia Active Problems:   Sepsis affecting skin (HCC)   Cellulitis of left hand   Staphylococcus aureus bacteremia with sepsis (HCC)   Weakness of both lower extremities   Pain of right clavicle   IVDU (intravenous drug user)   Septic arthritis of right sternoclavicular joint (HCC)   PFO (patent foramen ovale)   Tricuspid valve vegetation   Chronic diastolic CHF (congestive heart failure) (HCC)   #) MSSA tricuspid valve endocarditis: While initially the patient was improving she now has complications of her metastatic disease.  It is unclear at this time if her endocarditis is not controlled or if she needs better source control and just IV antibiotics.  She certainly does have metastatic disease to her right sternal  clavicular joint however now she has evidence of this spreading erythema noted on CT scan on 03/20/2017 with left sternocleidomastoid muscle myositis and adjacent cellulitis that extends into the left submandibular and left carotid spaces.  This along with her worsening neutropenia are suggestive of uncontrolled endocarditis.  PICC line was removed on 03/20/2017 due to possible source of her sepsis physiology.  Unfortunately she has very poor access and order for another PICC line was placed. -repeat echo -MRI of the sternum ordered -We will consider CT of the chest to evaluate for neck infection extending into her mediastinum -Repeat blood cultures as she continues to be febrile and have sepsis physiology -ID following appreciate t recommendations - Continue cefepime guarded 03/19/2017, IV nafcillin was continued 02/21/2017 to 03/19/2017  #) Profound neutropenia and lymphopenia: Per hematology possibly related to a reaction to her uncontrolled infection.  Nafcillin was discontinued and cefepime was started due to concern about possible nafcillin related bone marrow suppression. 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  Patient does have small smears of stool with blood in it but she reports a history of hemorrhoids.  She does have a remote history of an upper GI bleed in the setting of using NSAIDs however she has not had any in some time. -Obtain DIC panel -Transfuse 2 units packed red blood cells - Pantoprazole IV 40 mg twice daily -  We will consider GI consult if patient continues to be anemic or a significant amounts of blood loss from the stool  #) Psych/pain: -Continue Suboxone -Continue cyclobenzaprine 10 mg 3 times daily as needed -Continue duloxetine 60 mg twice daily  -continue alprazolam 0.5 mg 4  times daily as needed Continue oxycodone 5 mg every 4 hours as needed Continue olanzapine 5 mg every morning  Fluids: Tolerating p.o. Electrodes: Monitor and supplement Nutrition: Regular diet  Prophylaxis: Ambulatory  Disposition: Pending completion of IV antibiotics and resolution of multiple metastatic infectious complications  Full code  Consultants:   Infectious disease  Heme  Cardiology  CT Surgery  Procedures:  Echo 03/21/2017: Pending TEE 02/20/2017:- Tricuspid valve endocarditis with moderate to severe TR,   ?perforated leaflet. There was a large PFO, putting the patient   at risk for spread of infection from venous circulation to   arterial circulation and putting the mitral and aortic valves at higher risk. Echo 02/14/2017: - LVEF 60-65%, normal wall thickness, normal wall motion, grade 1   DD, normal LV filling pressure, normal LA size, moderate RAE mild TR, RVSP 46 mmHg, small IVC suggestive of volume depletion.  Antimicrobials: IV Ancef 2/-2/8 Zyvox 2/5-2/6, 2/17-/18 IV nafcillin 2/8-3/6 IV cefepime 3/6-> IV vancomycin and Zosyn 1/31-2/1    Subjective: She is quite tearful in the room today.  She continues to report severe pain in her left neck.  She is quite concerned about this new worsening complication.  She denies any abdominal pain, chest pain, nausea, vomiting, diarrhea.  Objective: Vitals:   03/21/17 0900 03/21/17 0909 03/21/17 0953 03/21/17 1306  BP:   102/69   Pulse: (!) 115 (!) 122 (!) 129   Resp: (!) 26 15 17    Temp: 98.2 F (36.8 C)  (!) 100.9 F (38.3 C) 99.1 F (37.3 C)  TempSrc: Axillary  Oral Axillary  SpO2: 100% 100% 99%   Weight:      Height:        Intake/Output Summary (Last 24 hours) at 03/21/2017 1315 Last data filed at 03/21/2017 0552 Gross per 24 hour  Intake 1106.33 ml  Output 2500 ml  Net -1393.67 ml   Filed Weights   03/18/17 0407 03/20/17 0232 03/21/17 0639  Weight: 78 kg (171 lb 14.4 oz) 78 kg (171 lb 14.4 oz)  79.2 kg (174 lb 9.7 oz)    Examination:  General exam: Uncomfortable Respiratory system: Clear to auscultation. Respiratory effort normal rhonchi. Cardiovascular system: Tachycardic, 2 out of 6 systolic murmur heard across precordium, bounding pulses Gastrointestinal system: Abdomen is nondistended, soft and nontender. No organomegaly or masses felt. Normal bowel sounds heard. Central nervous system: Alert and oriented. No focal neurological deficits. Extremities: Trace lower extremity edema Skin: Over left neck and submandibular region noted to have large area of erythema induration and edema that is quite tender to palpation Psychiatry: Judgement and insight appear normal. Mood & affect appropriate.     Data Reviewed: I have personally reviewed following labs and imaging studies  CBC: Recent Labs  Lab 03/17/17 0412 03/18/17 0330 03/19/17 0426 03/19/17 1048 03/20/17 0428 03/21/17 0411  WBC 2.5* 1.8* 1.2*  --  0.4* 0.6*  NEUTROABS  --   --   --  0.0* 0.0* 0.0*  HGB 9.1* 9.1* 9.3*  --  7.8* 6.4*  HCT 28.5* 28.1* 29.0*  --  23.9* 19.3*  MCV 91.3 90.9 90.6  --  90.9 87.7  PLT 354 340 369  --  251 158  Basic Metabolic Panel: Recent Labs  Lab 03/17/17 0412 03/18/17 0330 03/19/17 0426 03/20/17 0428 03/21/17 0411  NA 138 139 136 133* 135  K 3.9 3.9 3.9 3.5 3.2*  CL 109 107 106 106 109  CO2 22 23 19* 17* 17*  GLUCOSE 106* 98 94 104* 78  BUN 16 17 15 16 11   CREATININE 0.78 0.77 0.80 0.94 0.82  CALCIUM 8.7* 8.7* 8.8* 7.8* 8.0*   GFR: Estimated Creatinine Clearance: 106.7 mL/min (by C-G formula based on SCr of 0.82 mg/dL). Liver Function Tests: No results for input(s): AST, ALT, ALKPHOS, BILITOT, PROT, ALBUMIN in the last 168 hours. No results for input(s): LIPASE, AMYLASE in the last 168 hours. No results for input(s): AMMONIA in the last 168 hours. Coagulation Profile: No results for input(s): INR, PROTIME in the last 168 hours. Cardiac Enzymes: No results for  input(s): CKTOTAL, CKMB, CKMBINDEX, TROPONINI in the last 168 hours. BNP (last 3 results) No results for input(s): PROBNP in the last 8760 hours. HbA1C: No results for input(s): HGBA1C in the last 72 hours. CBG: No results for input(s): GLUCAP in the last 168 hours. Lipid Profile: No results for input(s): CHOL, HDL, LDLCALC, TRIG, CHOLHDL, LDLDIRECT in the last 72 hours. Thyroid Function Tests: No results for input(s): TSH, T4TOTAL, FREET4, T3FREE, THYROIDAB in the last 72 hours. Anemia Panel: No results for input(s): VITAMINB12, FOLATE, FERRITIN, TIBC, IRON, RETICCTPCT in the last 72 hours. Sepsis Labs: Recent Labs  Lab 03/19/17 1536 03/19/17 1541  PROCALCITON 2.49  --   LATICACIDVEN  --  1.0    Recent Results (from the past 240 hour(s))  Culture, blood (Routine X 2) w Reflex to ID Panel     Status: None (Preliminary result)   Collection Time: 03/19/17 10:40 AM  Result Value Ref Range Status   Specimen Description BLOOD LEFT HAND  Final   Special Requests IN PEDIATRIC BOTTLE Blood Culture adequate volume  Final   Culture   Final    NO GROWTH < 24 HOURS Performed at [facility], [address] [place], [place] [address]    Report Status PENDING  Incomplete  Culture, blood (Routine X 2) w Reflex to ID Panel     Status: None (Preliminary result)   Collection Time: 03/19/17 10:50 AM  Result Value Ref Range Status   Specimen Description BLOOD LEFT ARM  Final   Special Requests IN PEDIATRIC BOTTLE Blood Culture adequate volume  Final   Culture   Final    NO GROWTH < 24 HOURS Performed at [facility], [address] [place], [place] [address]    Report Status PENDING  Incomplete  Rapid Strep Screen (Not at [facility])     Status: None   Collection Time: 03/20/17  8:20 AM  Result Value Ref Range Status   Streptococcus, Group A Screen (Direct) NEGATIVE NEGATIVE Final    Comment: (NOTE) A Rapid Antigen test may result negative if the antigen level in the sample is  below the detection level of this test. The FDA has not cleared this test as a stand-alone test therefore the rapid antigen negative result has reflexed to a Group A Strep culture.          Radiology Studies: Ct Soft Tissue Neck W Contrast  Result Date: 03/20/2017 CLINICAL DATA:  Left-sided neck mass and fever EXAM: CT NECK WITH CONTRAST TECHNIQUE: Multidetector CT imaging of the neck was performed using the standard protocol following the bolus administration of intravenous contrast. CONTRAST:  75mL ISOVUE-300 IOPAMIDOL (ISOVUE-300) INJECTION 61% COMPARISON:  None. FINDINGS: Pharynx and larynx: --Nasopharynx: Fossae of Rosenmuller are clear. Normal adenoid tonsils for age. --Oral cavity and oropharynx: The palatine and lingual tonsils are normal. The visible oral cavity and floor of mouth are normal. --Hypopharynx: Normal vallecula and pyriform sinuses. --Larynx: Normal epiglottis and pre-epiglottic space. Normal aryepiglottic and vocal folds. --Retropharyngeal space: No abscess, effusion or lymphadenopathy. Salivary glands: --Parotid: No mass lesion or inflammation. No sialolithiasis or ductal dilatation. --Submandibular: Reactive enlargement of left submandibular gland. Normal right submandibular gland. No sialolithiasis. --Sublingual: Normal. No ranula or other visible lesion of the base of tongue and floor of mouth. Thyroid: Normal. Lymph nodes: There are bilateral enlarged lymph nodes, predominantly within the left cervical chain. Left level II a nodes measure up to 13 mm. There are numerous subcentimeter bilateral level 5A and 5B nodes. There is a large amount of soft tissue swelling of the left neck with inflammation of the left sternocleidomastoid muscle and inflammatory induration of the adjacent subcutaneous fat. There is thickening of the left aspect of the platysma. Inflammation extends into the left carotid and submandibular spaces. Vascular: Major cervical vessels are patent. Limited  intracranial: Normal. Visualized orbits: Normal. Mastoids and visualized paranasal sinuses: No fluid levels or advanced mucosal thickening. No mastoid effusion. Skeleton: Soft tissue thickening at the right sternoclavicular joint is compatible with reported septic arthritis. Upper chest: Clear. Other: None. IMPRESSION: 1. Left sternocleidomastoid myositis with cellulitis of the adjacent subcutaneous fat and probable reactive thickening of the left platysma muscle. Inflammatory change also extends into the left submandibular and left carotid spaces. The origin of the process is not clear. If there has been no injection or penetrating injury in this area, then hematogenous spread of infection would be an alternative possibility. 2. No abscess or drainable fluid collection. 3. Reactive cervical lymphadenopathy, left worse than right, and reactive edema of the left submandibular gland. 4. Extension of inflammation to the left carotid space, but no vascular occlusion. 5. Soft tissue thickening at the right sternoclavicular joint, compatible with reported septic arthritis. Electronically Signed   By: Deatra Robinson M.D.   On: 03/20/2017 19:35   Dg Chest Port 1 View  Result Date: 03/19/2017 CLINICAL DATA:  Shortness of breath and leg pain EXAM: PORTABLE CHEST 1 VIEW COMPARISON:  02/22/2017 FINDINGS: Cardiac shadow is within normal limits. Right-sided PICC line is now seen in satisfactory position. The lungs are well aerated bilaterally without focal infiltrate or sizable effusion. No bony abnormality is noted. IMPRESSION: No acute abnormality noted. Electronically Signed   By: Alcide Clever M.D.   On: 03/19/2017 17:42        Scheduled Meds: . buprenorphine-naloxone  2 tablet Sublingual Daily  . DULoxetine  60 mg Oral BID  . ferrous sulfate  325 mg Oral TID WC  . gadobenate dimeglumine  15 mL Intravenous Once  . multivitamin with minerals  1 tablet Oral Daily  . OLANZapine  5 mg Oral q morning - 10a  .  pantoprazole (PROTONIX) IV  40 mg Intravenous Q12H  . polyethylene glycol  17 g Oral Daily  . senna-docusate  1 tablet Oral BID   Continuous Infusions: . sodium chloride    . ceFEPime (MAXIPIME) IV Stopped (03/21/17 0644)     LOS: 36 days    Time spent: 45    Delaine Lame, MD [facility]  If 7PM-7AM, please contact night-coverage [email or web address] Password [email or web address] 03/21/2017, 1:15 PM

## 2017-03-21 NOTE — Progress Notes (Signed)
[facility]  Date of Admission:  02/13/2017             ASSESSMENT/PLAN  MSSA Bacteremia - Clinically she appears better despite the events of the last 24 hours. PICC line removed yesterday. Fevers decreased overnight. Cultures from 3/6 are no growth in 24 hours. Continue Cefepime and monitor cultures. Ok to place new PICC line. Will need long additional long term therapy now with new fevers.   Leukopenia - Continues to have leukopenia which is likely related to infection. Fevers have improved with change to cefepime. Continue to monitor WBC.  Anemia - New onset anemia with undetermined source. Nursing noted stool and urine with potential blood, however patient indicates it may have only been a couple of drops. Agree with hematology work up. Continue to monitor.   Principal Problem:   MSSA bacteremia Active Problems:   Sepsis affecting skin (HCC)   Cellulitis of left hand   Staphylococcus aureus bacteremia with sepsis (HCC)   Weakness of both lower extremities   Pain of right clavicle   IVDU (intravenous drug user)   Septic arthritis of right sternoclavicular joint (HCC)   PFO (patent foramen ovale)   Tricuspid valve vegetation   Chronic diastolic CHF (congestive heart failure) (HCC)   . buprenorphine-naloxone  2 tablet Sublingual Daily  . DULoxetine  60 mg Oral BID  . ferrous sulfate  325 mg Oral TID WC  . gadobenate dimeglumine  15 mL Intravenous Once  . multivitamin with minerals  1 tablet Oral Daily  . OLANZapine  5 mg Oral q morning - 10a  . pantoprazole (PROTONIX) IV  40 mg Intravenous Q12H  . polyethylene glycol  17 g Oral Daily  . senna-docusate  1 tablet Oral BID    SUBJECTIVE:  Continues to have have fevers up to 101.5 and leukopenia with WBC of 0.6. PICC line was removed yesterday and now has PIV in place. She is being evaluated for potential GI bleed, DIC or iron deficiency. Her hemoglobin was down to 6.4 this morning and per  nursing notes she had dark, tarry stool and urine was red in color.  She remains on cefepime presently.   Allergies  Allergen Reactions  . Azithromycin Nausea And Vomiting    ABDOMINAL PAIN  . Nsaids     UNSPECIFIED REACTION    . Hydrocodone Nausea And Vomiting  . Penicillins Nausea And Vomiting     Has patient had a PCN reaction causing immediate rash, facial/tongue/throat swelling, SOB or lightheadedness with hypotension: No Has patient had a PCN reaction causing severe rash involving mucus membranes or skin necrosis: No Has patient had a PCN reaction that required hospitalization No Has patient had a PCN reaction occurring within the last 10 years: No If all of the above answers are "NO", then may proceed with Cephalosporin use.   . Prednisone Nausea Only and Other (See Comments)    Can take shot, but pill form "caused stomach pain , nausea"     Review of Systems: Review of Systems  Constitutional: Positive for fever. Negative for chills and weight loss.  Respiratory: Negative for cough.   Cardiovascular: Negative for chest pain.  Gastrointestinal: Positive for blood in stool. Negative for abdominal pain, constipation, diarrhea, nausea and vomiting.  Genitourinary: Positive for hematuria. Negative for dysuria, flank pain, frequency and urgency.  Skin: Negative for rash.  Neurological: Negative for weakness.      OBJECTIVE: Vitals:   03/21/17 0639 03/21/17 0900 03/21/17  4098 03/21/17 0953  BP:    102/69  Pulse:  (!) 115 (!) 122 (!) 129  Resp:  (!) 26 15 17   Temp:  98.2 F (36.8 C)  (!) 100.9 F (38.3 C)  TempSrc:  Axillary  Oral  SpO2:  100% 100% 99%  Weight: 174 lb 9.7 oz (79.2 kg)     Height:       Body mass index is 27.35 kg/m.  Physical Exam  Constitutional: She is oriented to person, place, and time. No distress.  Seated on the side of the bed receiving PRBC.   Cardiovascular: Normal rate, regular rhythm and intact distal pulses. Exam reveals no gallop  and no friction rub.  Murmur heard. Pulmonary/Chest: Effort normal and breath sounds normal. No respiratory distress. She has no wheezes. She has no rales. She exhibits no tenderness.  Abdominal: Soft. Bowel sounds are normal.  Neurological: She is alert and oriented to person, place, and time.  Skin: Skin is warm and dry.  Psychiatric: Mood, memory, affect and judgment normal.    Lab Results Lab Results  Component Value Date   WBC 0.6 (LL) 03/21/2017   HGB 6.4 (LL) 03/21/2017   HCT 19.3 (L) 03/21/2017   MCV 87.7 03/21/2017   PLT 158 03/21/2017    Lab Results  Component Value Date   CREATININE 0.82 03/21/2017   BUN 11 03/21/2017   NA 135 03/21/2017   K 3.2 (L) 03/21/2017   CL 109 03/21/2017   CO2 17 (L) 03/21/2017    Lab Results  Component Value Date   ALT 22 02/18/2017   AST 50 (H) 02/18/2017   ALKPHOS 173 (H) 02/18/2017   BILITOT 1.4 (H) 02/18/2017     Microbiology: Recent Results (from the past 240 hour(s))  Culture, blood (Routine X 2) w Reflex to ID Panel     Status: None (Preliminary result)   Collection Time: 03/19/17 10:40 AM  Result Value Ref Range Status   Specimen Description BLOOD LEFT HAND  Final   Special Requests IN PEDIATRIC BOTTLE Blood Culture adequate volume  Final   Culture   Final    NO GROWTH < 24 HOURS Performed at [facility], [address], [place], [place] [address]    Report Status PENDING  Incomplete  Culture, blood (Routine X 2) w Reflex to ID Panel     Status: None (Preliminary result)   Collection Time: 03/19/17 10:50 AM  Result Value Ref Range Status   Specimen Description BLOOD LEFT ARM  Final   Special Requests IN PEDIATRIC BOTTLE Blood Culture adequate volume  Final   Culture   Final    NO GROWTH < 24 HOURS Performed at [facility], [address] [place], [place] [address]    Report Status PENDING  Incomplete  Rapid Strep Screen (Not at [facility])     Status: None   Collection Time: 03/20/17  8:20 AM  Result  Value Ref Range Status   Streptococcus, Group A Screen (Direct) NEGATIVE NEGATIVE Final    Comment: (NOTE) A Rapid Antigen test may result negative if the antigen level in the sample is below the detection level of this test. The FDA has not cleared this test as a stand-alone test therefore the rapid antigen negative result has reflexed to a Group A Strep culture.      Marcos Eke, NP [facility] for Infectious Disease [facility] [phone] Pager  03/21/2017  10:54 AM

## 2017-03-21 NOTE — Progress Notes (Signed)
CRITICAL VALUE ALERT  Critical value received:  k 3.2  Date of notification:  03/21/2017   Time of notification:  8:15   Responding MD:  Clearnce SorrelPurohit

## 2017-03-21 NOTE — Progress Notes (Signed)
Events in the 24 hours noted. Labs on February 2019 reviewed which showed white cell count 0.6, hemoglobin of 6.4 and a platelet count of 158. Her peripheral smear did not show any neutrophils. No schistocytes noted.  Her leukocytopenia is likely related to sepsis versus antibiotic use. Her worsening anemia could be related to active GI bleed, iron deficiency or possibly DIC.  I agree with a DIC panel and the pack red cell transfusion. I recommended repeating iron studies prior to pack red cell transfusion. Her iron studies from 02/17/2017 showed clear iron deficiency with iron level of 14 and saturation of 9%. Elevated ferritin is likely related to acute inflammation.

## 2017-03-21 NOTE — Progress Notes (Signed)
Per ECHO tech heart rate of 130's - 140's too high for ECHO, paged provider, order given for metoprolol, heart rate remained in 120's, ECHO was not able to be done.

## 2017-03-21 NOTE — CV Procedure (Addendum)
Attempted 2D Echo, Patient's heart rate is too high to perform the echo, will try to perform the exam at a later time.  Sinda DuJoseph Kalika Smay

## 2017-03-21 NOTE — Progress Notes (Signed)
Upon arrival into patient's room after she used the bathroom, the room smelled like cigarette smoke. Patient denied smoking in her room. RN explained to patient that she should not be smoking in the room because their is oxygen in the room that can explode.

## 2017-03-21 NOTE — Progress Notes (Signed)
At 0030, pt's temp spiked to 101.5 orally and HR was in the 120s. After 1 gram of tylenol, 800mg  of ibuprofen, and 1500 mL bolus of NS temp is now 98.3 and patient's HR is 105. Patient is sleeping at this time and BP is ranging in the 90's systolically and in the 60's diastolically with a MAP in the 70's. Bodenheimer,NP notified of current BP's and no new orders at this time. Will continue to monitor and treat per MD orders.

## 2017-03-21 NOTE — Progress Notes (Signed)
Per Dr. Carver Filaalone  Infectious disease okay to place PICC today.

## 2017-03-21 NOTE — Progress Notes (Signed)
CRITICAL VALUE ALERT  Critical Value:  Hgb 6.4  Date & Time Notied:  03/21/17 at 0540  Provider Notified: Bodenheimer,NP  Orders Received/Actions taken: Order placed for 2 units of blood. Will begin administration when blood is available.

## 2017-03-21 NOTE — Progress Notes (Signed)
Bright red blood per rectum, provider made aware.

## 2017-03-21 NOTE — Consult Note (Signed)
[facility] Consult: 4:18 PM 03/21/2017  LOS: 36 days    Referring Provider: Dr Celene Kras  Primary Care Physician:  Jani Gravel, MD Primary Gastroenterologist:  Dr. Oneida Alar and Dr Ardis Hughs    Reason for Consultation:  Bloody stool, acute Hgb decline.     HPI: [name] is a 32 y.o. female.  Hx Anxiety.  IBS-D.  Gastroparesis. Hyperemesis gravidarum. Anemia.  Fibromyalgia.  IVDA.    2004 laparoscopic appendectomy.  2008 Lap Cholecystectomy.  2014 tubal ligation.  2014 lap bil salpingo-oophorectomy.  2017 multiple dental extractions.  ~ 2005 EGD for large volume GIB: pt says they found ulcer(s). 2009 GES: 64% gastric retention at 2 hours.   2009 Ileocolonoscopy for chronic diarrhea.  Dr Oneida Alar. Patchy erythema with ulceration and mucosal sparing beginning~ 20 cm from the anal verge extending to hepatic flexure.  The rectum, ascending colon, and cecum were spared. Normal terminal ileum, approximately 10 cm visualized. 06/2009 EGD.  Report not found.  Pathology of stomach and SB: benign.  No H Pylori Biopsies: non-specific colitis, differentials include ischemia, infection, & NSAIDs. .  08/2011 Colonoscopy for diarrhea.  Normal TI and colon.    08/2011 EGD for chronic nausea.  Mild gastritis.  Path: chronic inactive gastritis.     01/2010 abdominal ultrasound showed fatty liver.  15 mm CBD, prominent for postcholecystectomy patient.  Mild, central, intrahepatic biliary dilatation.  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  bil PNA. Not a candidate for valve repalcement due to ongoing IVDA.Thrombocytopenia and neutropenia, ? Nafcillin vs infection related bone marrow suppression.  Received 1units PRBC on 2/7. Prolonged stay as needs long-term IV abx and no SNF willing to  accept, too risky to send back home.  On Suboxone for opiate addiction along with multiple psych meds.  PICC line removed 3/7 due to fevers.  Swelling in neck  Observed and CT neck 3/7 shows left SCM myositis inflammation into submandibular and left carotid spaces.  No abscess. Septic arthritis  At right sternoclavicular joint.  New PICC placement this afternoon.    Pt has chronically black, dark stool.  She says it is from Senokot she takes.  RN noted black stool with BRB streaks this AM, and later RN observed large volume non-clotted red blood in toilet.  Pt says it was not that much blood, that she had much more blood when dxd with PUD many years ago.  No abd pain, no nausea, eating well.  Remotely took Prilosec, then Zantac but no GI meds for several years.  Protonix 40 mg PO 3/6 -3/7,  IV BID started today.  Has also had red colored urine and had TNTC RBCs on 02/13/17 U/A.    Over last 2 days, Hgb went from 9.3 >> 7.8 >> >> 6.4 >> 2 U PRBC.  Was as low as 6.7 on 02/20/17.  Baseline from 2016-2017: ~ 14.  Platelets 158, WBCs 0.6.   Normal coags, BUN LFTs elevated but last assay 2/5: t bili 1.4, alk phos 173, AST/ALT 50/22.  Past Medical History:  Diagnosis Date  . Anemia, unspecified   . Anxiety   . Asthma   . Disc herniation    causes sciatica unsure which disc  . Esophageal reflux   . Fatty liver   . Fibromyalgia   . Gastroparesis   . Headache(784.0)   . Heart murmur   . History of narcotic addiction ([place]) 09/30/2012   2010:  Per pt, was addicted to narcotics; mom helped intervene and stopped taking opiods   . Hyperemesis gravidarum with metabolic disturbance, unspecified as to episode of care   . Irritable bowel syndrome   . MSSA bacteremia 02/20/2017  . Other and unspecified noninfectious gastroenteritis and colitis(558.9)   . Pregnant   . PTSD (post-traumatic stress disorder)   . Septic arthritis of right sternoclavicular joint ([place]) 02/20/2017  . Unspecified asthma(493.90)      Past Surgical History:  Procedure Laterality Date  . APPENDECTOMY    . CHOLECYSTECTOMY    . COLONOSCOPY    . LAPAROSCOPIC BILATERAL SALPINGECTOMY Bilateral 12/08/2012   Procedure: LAPAROSCOPIC BILATERAL SALPINGECTOMY;  Surgeon: Jonnie Kind, MD;  Location: [facility];  Service: Gynecology;  Laterality: Bilateral;  . MULTIPLE EXTRACTIONS WITH ALVEOLOPLASTY N/A 12/01/2015   Procedure: EXTRACTION TEETH TWO, THREE, FOUR, SIX, SEVEN, EIGHT, NINE, TEN, ELEVEN, TWELVE, FOURTEEN, FIFTEEN, TWENTY, TWENTY ONE, TWENTY EIGHT, TWENTY NINE, THIRTY AND THIRTY ONE WITH ALVEOLOPLASTY;  Surgeon: Diona Browner, DDS;  Location: [place];  Service: Oral Surgery;  Laterality: N/A;  . TEE WITHOUT CARDIOVERSION N/A 02/20/2017   Procedure: TRANSESOPHAGEAL ECHOCARDIOGRAM (TEE);  Surgeon: Larey Dresser, MD;  Location: [facility] ENDOSCOPY;  Service: Cardiovascular;  Laterality: N/A;  . TUBAL LIGATION      Prior to Admission medications   Medication Sig Start Date End Date Taking? Authorizing Provider  acetaminophen (TYLENOL) 500 MG tablet Take 1,000 mg by mouth every 6 (six) hours as needed for mild pain or moderate pain.   Yes [provider]  ALPRAZolam (XANAX) 0.5 MG tablet Take 1 tablet (0.5 mg total) by mouth 3 (three) times daily as needed for anxiety. Patient taking differently: Take 0.5 mg by mouth 4 (four) times daily.  05/29/14  Yes Nat Christen, MD  Buprenorphine HCl-Naloxone HCl (SUBOXONE) 4-1 MG FILM Place 2 Film under the tongue daily.    Yes [provider]  DULoxetine (CYMBALTA) 60 MG capsule Take 60 mg by mouth 2 (two) times daily.   Yes [provider]  Multiple Vitamin (MULTIVITAMIN WITH MINERALS) TABS tablet Take 1 tablet by mouth daily. ALL NATURAL VITAMIN: Made by [employer] Staff (system six)   Yes [provider]  OLANZapine (ZYPREXA) 5 MG tablet Take 1 tablet by mouth every morning.   Yes [provider]  ondansetron (ZOFRAN) 4 MG tablet Take 1 tablet (4 mg total)  by mouth every 6 (six) hours. For nausea or vomiting 02/11/17  Yes Lily Kocher, PA-C  cyclobenzaprine (FLEXERIL) 10 MG tablet Take 1 tablet (10 mg total) by mouth 3 (three) times daily. 02/11/17   Lily Kocher, PA-C    Scheduled Meds: . buprenorphine-naloxone  2 tablet Sublingual Daily  . DULoxetine  60 mg Oral BID  . ferrous sulfate  325 mg Oral TID WC  . gadobenate dimeglumine  15 mL Intravenous Once  . multivitamin with minerals  1 tablet Oral Daily  . nicotine  7 mg Transdermal Q24H  . OLANZapine  5 mg Oral q morning - 10a  . pantoprazole (PROTONIX) IV  40 mg Intravenous Q12H  .  polyethylene glycol  17 g Oral Daily  . senna-docusate  1 tablet Oral BID   Infusions: . sodium chloride    . ceFEPime (MAXIPIME) IV Stopped (03/21/17 1611)   PRN Meds: acetaminophen, ALPRAZolam, cyclobenzaprine, ibuprofen, magic mouthwash, ondansetron **OR** ondansetron (ZOFRAN) IV, oxyCODONE, phenol, sodium chloride flush, zolpidem   Allergies as of 02/13/2017 - Review Complete 02/13/2017  Allergen Reaction Noted  . Azithromycin Nausea And Vomiting 09/27/2013  . Nsaids  03/19/2012  . Hydrocodone Nausea And Vomiting 12/07/2012  . Penicillins Nausea And Vomiting   . Prednisone Nausea Only and Other (See Comments)     Family History  Problem Relation Age of Onset  . Ulcerative colitis Paternal Grandmother   . Cancer Paternal Grandmother   . Colon polyps Paternal Grandfather   . Cancer Paternal Grandfather        thyroid  . Diabetes Father   . Heart disease Father   . Kidney disease Father   . Hypertension Father     Social History   Socioeconomic History  . Marital status: Single    Spouse name: Not on file  . Number of children: 2  . Years of education: Not on file  . Highest education level: Not on file  Social Needs  . Financial resource strain: Not on file  . Food insecurity - worry: Not on file  . Food insecurity - inability: Not on file  . Transportation needs - medical:  Not on file  . Transportation needs - non-medical: Not on file  Occupational History  . Occupation: CNA  Tobacco Use  . Smoking status: Current Every Day Smoker    Packs/day: 2.00    Years: 10.00    Pack years: 20.00    Types: Cigarettes  . Smokeless tobacco: Never Used  Substance and Sexual Activity  . Alcohol use: No  . Drug use: No    Comment: pt denies 11/30/15  . Sexual activity: No    Birth control/protection: Surgical  Other Topics Concern  . Not on file  Social History Narrative   Daily caffeine     REVIEW OF SYSTEMS: Constitutional: Feels a little bit weak but not profoundly so. ENT:  No nose bleeds Pulm: No cough.  No shortness of breath.  Staff reports she is been sneaking cigarettes in her room. CV:  No palpitations, no LE edema.  No chest pain.  Does not notice that she is tachycardic. GU:  No hematuria, no frequency GI:  See HPI Heme: Denies unusual bleeding or bruising. Transfusions:  Per HPI Neuro:  No headaches, no peripheral tingling or numbness.  No seizures   Derm:  No itching, no rash or sores.  Endocrine:  No sweats or chills.  No polyuria or dysuria Immunization: Did not inquire as to recent immunizations. Travel:  None beyond local counties in last few months.    PHYSICAL EXAM: Vital signs in last 24 hours: Vitals:   03/21/17 1454 03/21/17 1503  BP:  97/66  Pulse: (!) 132 (!) 128  Resp: 18 (!) 31  Temp:    SpO2: 99% 100%   Wt Readings from Last 3 Encounters:  03/21/17 174 lb 9.7 oz (79.2 kg)  02/10/17 163 lb (73.9 kg)  06/13/16 161 lb (73 kg)    General: Overweight, unkempt, alert, comfortable, somewhat chronically ill appearing WF. Head: No facial asymmetry or swelling.  No signs of head trauma. Eyes: No conjunctival pallor.  No scleral icterus.  EOMI. Ears: Not hard of hearing Nose:  no congestion,  no discharge Mouth: Full upper denture, lower partial denture.  Mucosa pink, moist, clear.  Tongue midline. Neck: Generally swelling  in his symmetric, bilateral pattern.  No redness. Lungs: Clear bilaterally.  No cough or labored breathing. Heart: Tachycardic, no murmurs, rubs, gallops.  Abdomen: Soft.  Not tender.  Not distended.  No HSM, masses, bruits, hernias..   Rectal: Staining from heme surrounding the perianal region.  No blood on exam glove.  Palpable and slightly tender internal hemorrhoids. Musc/Skeltl: No obvious joint deformities, redness or swelling. Extremities: No CCE. Neurologic: Alert.  Oriented x3.  Moves all 4 limbs.  No tremor.  No gross weakness or deficits. Skin: Some mottling on the skin of the arms and upper trunk Tattoos: Several tattoos on the arms.   Psych: Cooperative.  Calm.  Intake/Output from previous day: 03/07 0701 - 03/08 0700 In: 1366.3 [P.O.:378; I.V.:888.3; IV Piggyback:100] Out: 3000 [Urine:3000] Intake/Output this shift: Total I/O In: 330 [Blood:330] Out: -   LAB RESULTS: Recent Labs    03/19/17 0426 03/20/17 0428 03/21/17 0411  WBC 1.2* 0.4* 0.6*  HGB 9.3* 7.8* 6.4*  HCT 29.0* 23.9* 19.3*  PLT 369 251 158   BMET Lab Results  Component Value Date   NA 135 03/21/2017   NA 133 (L) 03/20/2017   NA 136 03/19/2017   K 3.2 (L) 03/21/2017   K 3.5 03/20/2017   K 3.9 03/19/2017   CL 109 03/21/2017   CL 106 03/20/2017   CL 106 03/19/2017   CO2 17 (L) 03/21/2017   CO2 17 (L) 03/20/2017   CO2 19 (L) 03/19/2017   GLUCOSE 78 03/21/2017   GLUCOSE 104 (H) 03/20/2017   GLUCOSE 94 03/19/2017   BUN 11 03/21/2017   BUN 16 03/20/2017   BUN 15 03/19/2017   CREATININE 0.82 03/21/2017   CREATININE 0.94 03/20/2017   CREATININE 0.80 03/19/2017   CALCIUM 8.0 (L) 03/21/2017   CALCIUM 7.8 (L) 03/20/2017   CALCIUM 8.8 (L) 03/19/2017   LFT No results for input(s): PROT, ALBUMIN, AST, ALT, ALKPHOS, BILITOT, BILIDIR, IBILI in the last 72 hours. PT/INR Lab Results  Component Value Date   INR 1.08 02/13/2017   INR 1.1 03/06/2007   Hepatitis Panel No results for input(s):  HEPBSAG, HCVAB, HEPAIGM, HEPBIGM in the last 72 hours. C-Diff No components found for: CDIFF Lipase     Component Value Date/Time   LIPASE 22 07/01/2008 0429    Drugs of Abuse     Component Value Date/Time   LABOPIA NEG 09/02/2012 1424   COCAINSCRNUR NEG 09/02/2012 1424   LABBENZ NEG 09/02/2012 1424   AMPHETMU NEG 09/02/2012 1424     RADIOLOGY STUDIES: Ct Soft Tissue Neck W Contrast  Result Date: 03/20/2017 CLINICAL DATA:  Left-sided neck mass and fever EXAM: CT NECK WITH CONTRAST TECHNIQUE: Multidetector CT imaging of the neck was performed using the standard protocol following the bolus administration of intravenous contrast. CONTRAST:  35m ISOVUE-300 IOPAMIDOL (ISOVUE-300) INJECTION 61% COMPARISON:  None. FINDINGS: Pharynx and larynx: --Nasopharynx: Fossae of Rosenmuller are clear. Normal adenoid tonsils for age. --Oral cavity and oropharynx: The palatine and lingual tonsils are normal. The visible oral cavity and floor of mouth are normal. --Hypopharynx: Normal vallecula and pyriform sinuses. --Larynx: Normal epiglottis and pre-epiglottic space. Normal aryepiglottic and vocal folds. --Retropharyngeal space: No abscess, effusion or lymphadenopathy. Salivary glands: --Parotid: No mass lesion or inflammation. No sialolithiasis or ductal dilatation. --Submandibular: Reactive enlargement of left submandibular gland. Normal right submandibular gland. No sialolithiasis. --Sublingual: Normal. No ranula  or other visible lesion of the base of tongue and floor of mouth. Thyroid: Normal. Lymph nodes: There are bilateral enlarged lymph nodes, predominantly within the left cervical chain. Left level II a nodes measure up to 13 mm. There are numerous subcentimeter bilateral level 5A and 5B nodes. There is a large amount of soft tissue swelling of the left neck with inflammation of the left sternocleidomastoid muscle and inflammatory induration of the adjacent subcutaneous fat. There is thickening of the  left aspect of the platysma. Inflammation extends into the left carotid and submandibular spaces. Vascular: Major cervical vessels are patent. Limited intracranial: Normal. Visualized orbits: Normal. Mastoids and visualized paranasal sinuses: No fluid levels or advanced mucosal thickening. No mastoid effusion. Skeleton: Soft tissue thickening at the right sternoclavicular joint is compatible with reported septic arthritis. Upper chest: Clear. Other: None. IMPRESSION: 1. Left sternocleidomastoid myositis with cellulitis of the adjacent subcutaneous fat and probable reactive thickening of the left platysma muscle. Inflammatory change also extends into the left submandibular and left carotid spaces. The origin of the process is not clear. If there has been no injection or penetrating injury in this area, then hematogenous spread of infection would be an alternative possibility. 2. No abscess or drainable fluid collection. 3. Reactive cervical lymphadenopathy, left worse than right, and reactive edema of the left submandibular gland. 4. Extension of inflammation to the left carotid space, but no vascular occlusion. 5. Soft tissue thickening at the right sternoclavicular joint, compatible with reported septic arthritis. Electronically Signed   By: Ulyses Jarred M.D.   On: 03/20/2017 19:35   Dg Chest Port 1 View  Result Date: 03/19/2017 CLINICAL DATA:  Shortness of breath and leg pain EXAM: PORTABLE CHEST 1 VIEW COMPARISON:  02/22/2017 FINDINGS: Cardiac shadow is within normal limits. Right-sided PICC line is now seen in satisfactory position. The lungs are well aerated bilaterally without focal infiltrate or sizable effusion. No bony abnormality is noted. IMPRESSION: No acute abnormality noted. Electronically Signed   By: Inez Catalina M.D.   On: 03/19/2017 17:42   [place] Ekg Site Rite  Result Date: 03/21/2017 If Site Rite image not attached, placement could not be confirmed due to current cardiac  rhythm.    IMPRESSION:   *  Hematochezia.  Wonder if this is from hemorrhoids versus diverticular source.  Also not able to rule out upper GI source.  Protonix 40 mg PO 3/6 -3/7,  IV BID started today.   *   Fatty liver on 2012 ultrasound, ?has this evolved to cirrhosis?  Initial labs with elevated LFTs, especially alkaline phosphatase.  Mild increase T bili and AST.  PT/INR never deranged.    *    Acute on chronic anemia.  S/p 2 U PRBC today.  Multifactorial.   Anemia, neutropenia has been attributed to acute infectious illness, possibly to meds, by hematologist.  *  MSSA Staph endocarditis.  Non operative candidate due to uncontrolled IVDA.  Undergoing prolonged course of abx.  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  *  Myositis of left SCM.  Septic arthritis right AC joint. Likely hematologic seeding from septic emboli despite abx.     *  IVDA, opiate addiction and ongoing use despite outpt Suboxone Rx.  HIV, hepatitis ABC serologies negative.     PLAN:     *   Observation.  No plans to scope pt now, especially given her neutropenia.  Continue IB BID Protonix.  Continue HH diet.  Azucena Freed  03/21/2017, 4:18 PM Pager: [phone]

## 2017-03-21 NOTE — Progress Notes (Signed)
Patient voided in the toilet this morning and her urine was dusky red in color and stool was black and tarry with one red streak. Bodenheimer,NP notified. Will continue to monitor and treat per MD orders.

## 2017-03-22 ENCOUNTER — Inpatient Hospital Stay (HOSPITAL_COMMUNITY): Payer: Medicaid Other

## 2017-03-22 DIAGNOSIS — M009 Pyogenic arthritis, unspecified: Secondary | ICD-10-CM

## 2017-03-22 DIAGNOSIS — R319 Hematuria, unspecified: Secondary | ICD-10-CM | POA: Diagnosis present

## 2017-03-22 DIAGNOSIS — K625 Hemorrhage of anus and rectum: Secondary | ICD-10-CM | POA: Diagnosis present

## 2017-03-22 DIAGNOSIS — I361 Nonrheumatic tricuspid (valve) insufficiency: Secondary | ICD-10-CM

## 2017-03-22 DIAGNOSIS — I38 Endocarditis, valve unspecified: Secondary | ICD-10-CM

## 2017-03-22 DIAGNOSIS — L03221 Cellulitis of neck: Secondary | ICD-10-CM

## 2017-03-22 LAB — TYPE AND SCREEN
ABO/RH(D): O POS
Antibody Screen: NEGATIVE
Unit division: 0
Unit division: 0

## 2017-03-22 LAB — COMPREHENSIVE METABOLIC PANEL
ALT: 9 U/L — ABNORMAL LOW (ref 14–54)
AST: 11 U/L — ABNORMAL LOW (ref 15–41)
BUN: 10 mg/dL (ref 6–20)
CO2: 16 mmol/L — ABNORMAL LOW (ref 22–32)
Chloride: 107 mmol/L (ref 101–111)
Creatinine, Ser: 0.82 mg/dL (ref 0.44–1.00)
GFR calc non Af Amer: >60 mL/min (ref >60)
Potassium: 3.5 mmol/L (ref 3.5–5.1)
Sodium: 132 mmol/L — ABNORMAL LOW (ref 135–145)

## 2017-03-22 LAB — CULTURE, GROUP A STREP (THRC)

## 2017-03-22 LAB — ECHOCARDIOGRAM LIMITED
Height: 67 in
Weight: 2765.45 [oz_av]

## 2017-03-22 LAB — COMPREHENSIVE METABOLIC PANEL WITH GFR
Albumin: 1.8 g/dL — ABNORMAL LOW (ref 3.5–5.0)
Alkaline Phosphatase: 69 U/L (ref 38–126)
Anion gap: 9 (ref 5–15)
Calcium: 8.4 mg/dL — ABNORMAL LOW (ref 8.9–10.3)
GFR calc Af Amer: >60 mL/min (ref >60)
Glucose, Bld: 107 mg/dL — ABNORMAL HIGH (ref 65–99)
Total Bilirubin: 2 mg/dL — ABNORMAL HIGH (ref 0.3–1.2)
Total Protein: 5.8 g/dL — ABNORMAL LOW (ref 6.5–8.1)

## 2017-03-22 LAB — BPAM RBC
Blood Product Expiration Date: 201903152359
Blood Product Expiration Date: 201904042359
ISSUE DATE / TIME: 201903080947
ISSUE DATE / TIME: 201903081703
Unit Type and Rh: 5100
Unit Type and Rh: 5100

## 2017-03-22 LAB — CBC WITH DIFFERENTIAL/PLATELET
Basophils Absolute: 0 10*3/uL (ref 0.0–0.1)
Basophils Relative: 0 %
Eosinophils Absolute: 0 10*3/uL (ref 0.0–0.7)
Eosinophils Relative: 0 %
HCT: 21.8 % — ABNORMAL LOW (ref 36.0–46.0)
Hemoglobin: 7.4 g/dL — ABNORMAL LOW (ref 12.0–15.0)
Lymphocytes Relative: 100 %
Lymphs Abs: 0.7 K/uL (ref 0.7–4.0)
MCH: 29.4 pg (ref 26.0–34.0)
MCHC: 33.9 g/dL (ref 30.0–36.0)
MCV: 86.5 fL (ref 78.0–100.0)
Monocytes Absolute: 0 10*3/uL — ABNORMAL LOW (ref 0.1–1.0)
Monocytes Relative: 0 %
Neutro Abs: 0 10*3/uL — ABNORMAL LOW (ref 1.7–7.7)
Neutrophils Relative %: 0 %
Platelets: 147 10*3/uL — ABNORMAL LOW (ref 150–400)
RBC: 2.52 MIL/uL — ABNORMAL LOW (ref 3.87–5.11)
RDW: 17.7 % — ABNORMAL HIGH (ref 11.5–15.5)
WBC: 0.7 10*3/uL — CL (ref 4.0–10.5)

## 2017-03-22 LAB — DIC (DISSEMINATED INTRAVASCULAR COAGULATION)PANEL
D-Dimer, Quant: 1.97 ug/mL-FEU — ABNORMAL HIGH (ref 0.00–0.50)
Fibrinogen: >800 mg/dL — ABNORMAL HIGH (ref 210–475)
INR: 1.4
Platelets: 146 10*3/uL — ABNORMAL LOW (ref 150–400)
Prothrombin Time: 17 seconds — ABNORMAL HIGH (ref 11.4–15.2)
Smear Review: NONE SEEN
aPTT: 49 s — ABNORMAL HIGH (ref 24–36)

## 2017-03-22 LAB — MAGNESIUM: Magnesium: 1.6 mg/dL — ABNORMAL LOW (ref 1.7–2.4)

## 2017-03-22 MED ORDER — CEFAZOLIN SODIUM-DEXTROSE 2-4 GM/100ML-% IV SOLN
2.0000 g | Freq: Three times a day (TID) | INTRAVENOUS | Status: DC
  Administered 2017-03-22 – 2017-04-11 (×61): 2 g via INTRAVENOUS
  Filled 2017-03-22 (×63): qty 100

## 2017-03-22 MED ORDER — SALINE SPRAY 0.65 % NA SOLN
1.0000 | NASAL | Status: DC | PRN
Start: 2017-03-22 — End: 2017-04-15
  Administered 2017-03-22 – 2017-03-23 (×2): 1 via NASAL
  Filled 2017-03-22: qty 44

## 2017-03-22 MED ORDER — MAGNESIUM SULFATE 2 GM/50ML IV SOLN
2.0000 g | Freq: Once | INTRAVENOUS | Status: AC
  Administered 2017-03-22: 2 g via INTRAVENOUS
  Filled 2017-03-22: qty 50

## 2017-03-22 NOTE — Progress Notes (Signed)
Pharmacy Antibiotic Note [name] is a 32 y.o. female admitted on 02/13/2017. Pt with hx of MSSA bacteremia with concomitant tricuspid valve endocarditis that was being treated with nafcillin for this but changed to Cefepime on 3/6 due to leukopenia and concern for new infection.   Plan: 1. Continue Cefepime 2 gram IV every 8 hours    Height: 5\' 7"  (170.2 cm) Weight: 172 lb 13.5 oz (78.4 kg) IBW/kg (Calculated) : 61.6  Temp (24hrs), Avg:100.1 F (37.8 C), Min:98.2 F (36.8 C), Max:102.8 F (39.3 C)  Recent Labs  Lab 03/18/17 0330 03/19/17 0426 03/19/17 1541 03/20/17 0428 03/21/17 0411 03/22/17 0224  WBC 1.8* 1.2*  --  0.4* 0.6* 0.7*  CREATININE 0.77 0.80  --  0.94 0.82 0.82  LATICACIDVEN  --   --  1.0  --   --   --     Estimated Creatinine Clearance: 106.2 mL/min (by C-G formula based on SCr of 0.82 mg/dL).    Allergies  Allergen Reactions  . Azithromycin Nausea And Vomiting    ABDOMINAL PAIN  . Nsaids     UNSPECIFIED REACTION    . Hydrocodone Nausea And Vomiting  . Penicillins Nausea And Vomiting     Has patient had a PCN reaction causing immediate rash, facial/tongue/throat swelling, SOB or lightheadedness with hypotension: No Has patient had a PCN reaction causing severe rash involving mucus membranes or skin necrosis: No Has patient had a PCN reaction that required hospitalization No Has patient had a PCN reaction occurring within the last 10 years: No If all of the above answers are "NO", then may proceed with Cephalosporin use.   . Prednisone Nausea Only and Other (See Comments)    Can take shot, but pill form "caused stomach pain , nausea"    Antimicrobials this admission: Vanc 1/31>>2/1 Zosyn 1/31>>2/1 Ancef 2/1>>2/3; 2/6>>2/11 Nafcillin 2/11 >> 3/6 originally planned through 4/2 (using 2/6 as D1) Bactrim PO 2/3>>2/5 Zyvox 2/5>>2/6  Cefepime 3/6>>  Microbiology results: 1/31 BCx: 2/2 MSSA 2/1 BCx: 1/1 MSSA 2/4 BCx: 1/2 MSSA 2/6 BCx:  NGF 3/6 BCx: ngtd 3/7 strep: pending  3/8 bcx: px   Thank you for allowing pharmacy to participate in this patients care.  Pollyann SamplesAndy Korayma Hagwood, PharmD, BCPS 03/22/2017, 10:21 AM

## 2017-03-22 NOTE — Progress Notes (Addendum)
[facility]   Reason for visit: Follow up on Staph aureus bacteremia, spetic arthritis, endocarditis; new left neck cellulitis  Interval History: continues to be febrile, WBC remains low at 0.7; no acute events  Physical Exam: Constitutional:  Vitals:   03/22/17 1031 03/22/17 1059  BP:    Pulse:    Resp:  19  Temp: (!) 101.5 F (38.6 C)   SpO2:     patient appears in NAD HENT: increased erythema of left neck Respiratory: Normal respiratory effort; CTA B Cardiovascular: RRR GI: soft, nt, nd  Review of Systems: Constitutional: negative for anorexia Gastrointestinal: negative for diarrhea  Lab Results  Component Value Date   WBC 0.7 (LL) 03/22/2017   HGB 7.4 (L) 03/22/2017   HCT 21.8 (L) 03/22/2017   MCV 86.5 03/22/2017   PLT 147 (L) 03/22/2017   PLT 146 (L) 03/22/2017    Lab Results  Component Value Date   CREATININE 0.82 03/22/2017   BUN 10 03/22/2017   NA 132 (L) 03/22/2017   K 3.5 03/22/2017   CL 107 03/22/2017   CO2 16 (L) 03/22/2017    Lab Results  Component Value Date   ALT 9 (L) 03/22/2017   AST 11 (L) 03/22/2017   ALKPHOS 69 03/22/2017     Microbiology: Recent Results (from the past 240 hour(s))  Culture, blood (Routine X 2) w Reflex to ID Panel     Status: None (Preliminary result)   Collection Time: 03/19/17 10:40 AM  Result Value Ref Range Status   Specimen Description BLOOD LEFT HAND  Final   Special Requests IN PEDIATRIC BOTTLE Blood Culture adequate volume  Final   Culture   Final    NO GROWTH 2 DAYS Performed at [facility], [address] [place], [place] [address]    Report Status PENDING  Incomplete  Culture, blood (Routine X 2) w Reflex to ID Panel     Status: None (Preliminary result)   Collection Time: 03/19/17 10:50 AM  Result Value Ref Range Status   Specimen Description BLOOD LEFT ARM  Final   Special Requests IN PEDIATRIC BOTTLE Blood Culture adequate volume  Final   Culture   Final   NO GROWTH 2 DAYS Performed at [facility], [address] [place], [place] [address]    Report Status PENDING  Incomplete  Rapid Strep Screen (Not at [facility])     Status: None   Collection Time: 03/20/17  8:20 AM  Result Value Ref Range Status   Streptococcus, Group A Screen (Direct) NEGATIVE NEGATIVE Final    Comment: (NOTE) A Rapid Antigen test may result negative if the antigen level in the sample is below the detection level of this test. The FDA has not cleared this test as a stand-alone test therefore the rapid antigen negative result has reflexed to a Group A Strep culture.   Culture, group A strep     Status: None (Preliminary result)   Collection Time: 03/20/17  8:20 AM  Result Value Ref Range Status   Specimen Description THROAT  Final   Special Requests NONE Reflexed from [id number]  Final   Culture   Final    CULTURE REINCUBATED FOR BETTER GROWTH Performed at [facility], [address] [place], [place] [address]    Report Status PENDING  Incomplete    Impression/Plan:  1. Fever, cellulitis - nothing drainable on CT.  Normal vessels so no septic thrombophlebitis.  Negative cultures  to date.  I most suspect an extension of the same process.   I am going to stop Pseudomonal coverage and go back to MSSA coverage with cefazolin.    2.  Leukopenia - stable, still very low.  Continue to monitor.  3.  Endocarditis - on prolonged treatment.   4. Septic arthritis - I will cancel MRI scheduled for 3/15 since she just had a CT and reevaluated septic arthritis.

## 2017-03-22 NOTE — Progress Notes (Signed)
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  Patient was not felt to be a candidate for valve replacement at this time due to her ongoing drug use.  She was also noted to have right sternoclavicular septic arthritis.  She was initially maintained on IV nafcillin to complete a total of 6-8 weeks of IV antibiotics as she was not safe to discharge home with PICC line and no facility was willing to accept the patient.  Her course has been complicated by an episode on 03/19/2017 of sepsis physiology with worsening pancytopenia and neutropenia for which hematology was consulted and is following as well as worsening cellulitis and myositis of the left neck and shoulder area.  She has also developed progressive anemia has had small streaks of blood in her stool.    Assessment & Plan:   Principal Problem:   MSSA bacteremia Active Problems:   Sepsis affecting skin (HCC)   Cellulitis of left hand   Staphylococcus aureus bacteremia with sepsis (HCC)   Weakness of both lower extremities   Pain of right clavicle   IVDU (intravenous drug user)   Septic arthritis of right sternoclavicular joint (HCC)   PFO (patent foramen ovale)   Tricuspid valve vegetation   Chronic diastolic CHF (congestive heart failure) (HCC)   #) MSSA tricuspid valve endocarditis: While initially the patient was improving she now has complications of her metastatic disease including right sternoclavicular septic arthritis and left sternocleidomastoid myositis and spreading infection around the neck area. -repeat echo to be performed today, unable to be performed  yesterday as patient's heart rate was too high and while it did decrease with IV metoprolol 2.5 mg it was not sufficient to do the echo -MRI of the sternum ordered for 03/28/2017 -PICC line was placed on 03/21/2017 due to difficult access -We will consider CT of the chest to evaluate for neck infection extending into her mediastinum -Repeat blood cultures pending -ID following appreciate t recommendations - Continue cefepime started 03/19/2017, IV nafcillin was continued 02/21/2017 to 03/19/2017  #) Profound neutropenia and lymphopenia: Per hematology possibly related to a reaction to her uncontrolled infection.  Nafcillin was discontinued and cefepime was started due to concern about possible nafcillin related bone marrow suppression. -hematology following appreciate recommendations -No need for Neupogen at this time - Will order lymphocyte enumeration panel -We will consider sending off blood for flow cytometry  #) Anemia: Multifactorial including iron deficiency, bone marrow suppression from sepsis/endocarditis and possibly nafcillin, however she is also developed some dark stools and blood in her stools.  She is status post transfusion of 2 units packed red blood cells on 03/21/2017. -DIC panel shows no evidence of schistocytes and fibrinogen is quite elevated -We will consider sending off viral PCR's for possible bone marrow suppression - Pantoprazole IV 40 mg twice daily -Per GI they did not recommend any endoscopy or colonoscopy unless more overt bleeding was present.  They also recommended cross-sectional imaging of the abdomen and pelvis if she began bleeding again.  #) Psych/pain: -Continue Suboxone -Continue cyclobenzaprine 10 mg 3 times daily as needed -Continue duloxetine 60 mg twice daily  -continue alprazolam 0.5 mg 4  times daily as needed Continue oxycodone 5 mg every 4 hours as needed Continue olanzapine 5 mg every morning  Fluids: Tolerating p.o. Electrodes: Monitor and  supplement Nutrition: Regular diet  Prophylaxis: Ambulatory  Disposition: Pending completion of IV antibiotics and resolution of multiple metastatic infectious complications  Full code  Consultants:   Infectious disease  Heme  Cardiology  CT Surgery  Procedures: Echo 03/21/2017: Pending  TEE 02/20/2017:- Tricuspid valve endocarditis with moderate to severe TR,   ?perforated leaflet. There was a large PFO, putting the patient   at risk for spread of infection from venous circulation to   arterial circulation and putting the mitral and aortic valves at higher risk.  Echo 02/14/2017: - LVEF 60-65%, normal wall thickness, normal wall motion, grade 1   DD, normal LV filling pressure, normal LA size, moderate RAE mild TR, RVSP 46 mmHg, small IVC suggestive of volume depletion.  PICC placed 03/21/2017  Antimicrobials: IV Ancef 2/-2/8 Zyvox 2/5-2/6, 2/17-/18 IV nafcillin 2/8-3/6 IV cefepime 3/6-> IV vancomycin and Zosyn 1/31-2/1    Subjective: Patient reports that her left-sided neck infection and pain has stabilized or improved slightly.  She is less tearful and in less pain.  She does report some sinus congestion but has no pain with moving her eyes.  She is defervesced today.  Objective: Vitals:   03/22/17 0616 03/22/17 0816 03/22/17 0916 03/22/17 0928  BP: 98/70  108/76   Pulse: (!) 110 (!) 116    Resp: 19 20 (!) 26   Temp:  (!) 100.7 F (38.2 C)  99.7 F (37.6 C)  TempSrc:    Oral  SpO2: 98%     Weight:      Height:        Intake/Output Summary (Last 24 hours) at 03/22/2017 0955 Last data filed at 03/21/2017 2000 Gross per 24 hour  Intake 1172 ml  Output -  Net 1172 ml   Filed Weights   03/20/17 0232 03/21/17 0639 03/22/17 0440  Weight: 78 kg (171 lb 14.4 oz) 79.2 kg (174 lb 9.7 oz) 78.4 kg (172 lb 13.5 oz)    Examination:  General exam: Acute distress Respiratory system: Clear to auscultation. Respiratory effort normal rhonchi. Cardiovascular system:  Tachycardic, 2 out of 6 systolic murmur heard across precordium, bounding pulses Gastrointestinal system: Abdomen is nondistended, soft and nontender. No organomegaly or masses felt. Normal bowel sounds heard. Central nervous system: Alert and oriented. No focal neurological deficits. Extremities: Trace lower extremity edema Skin: Over left neck and submandibular region noted to have large area of erythema induration and edema that is quite tender to palpation, unchanged from yesterday Psychiatry: Judgement and insight appear normal. Mood & affect appropriate.     Data Reviewed: I have personally reviewed following labs and imaging studies  CBC: Recent Labs  Lab 03/18/17 0330 03/19/17 0426 03/19/17 1048 03/20/17 0428 03/21/17 0411 03/21/17 2324 03/22/17 0224  WBC 1.8* 1.2*  --  0.4* 0.6*  --  0.7*  NEUTROABS  --   --  0.0* 0.0* 0.0*  --  0.0*  HGB 9.1* 9.3*  --  7.8* 6.4* 8.0* 7.4*  HCT 28.1* 29.0*  --  23.9* 19.3* 23.0* 21.8*  MCV 90.9 90.6  --  90.9 87.7  --  86.5  PLT 340 369  --  251 158  --  146*  147*   Basic Metabolic Panel: Recent Labs  Lab 03/18/17 0330 03/19/17 0426 03/20/17 0428 03/21/17 0411 03/22/17 0224  NA 139 136 133* 135 132*  K 3.9 3.9 3.5 3.2* 3.5  CL 107 106 106 109 107  CO2 23 19* 17* 17* 16*  GLUCOSE 98 94 104* 78 107*  BUN 17 15 16 11 10   CREATININE 0.77 0.80 0.94 0.82 0.82  CALCIUM 8.7* 8.8* 7.8* 8.0* 8.4*  MG  --   --   --   --  1.6*   GFR: Estimated Creatinine Clearance: 106.2 mL/min (by C-G formula based on SCr of 0.82 mg/dL). Liver Function Tests: Recent Labs  Lab 03/22/17 0224  AST 11*  ALT 9*  ALKPHOS 69  BILITOT 2.0*  PROT 5.8*  ALBUMIN 1.8*   No results for input(s): LIPASE, AMYLASE in the last 168 hours. No results for input(s): AMMONIA in the last 168 hours. Coagulation Profile: Recent Labs  Lab 03/22/17 0224  INR 1.40   Cardiac Enzymes: No results for input(s): CKTOTAL, CKMB, CKMBINDEX, TROPONINI in the last 168  hours. BNP (last 3 results) No results for input(s): PROBNP in the last 8760 hours. HbA1C: No results for input(s): HGBA1C in the last 72 hours. CBG: No results for input(s): GLUCAP in the last 168 hours. Lipid Profile: No results for input(s): CHOL, HDL, LDLCALC, TRIG, CHOLHDL, LDLDIRECT in the last 72 hours. Thyroid Function Tests: No results for input(s): TSH, T4TOTAL, FREET4, T3FREE, THYROIDAB in the last 72 hours. Anemia Panel: No results for input(s): VITAMINB12, FOLATE, FERRITIN, TIBC, IRON, RETICCTPCT in the last 72 hours. Sepsis Labs: Recent Labs  Lab 03/19/17 1536 03/19/17 1541  PROCALCITON 2.49  --   LATICACIDVEN  --  1.0    Recent Results (from the past 240 hour(s))  Culture, blood (Routine X 2) w Reflex to ID Panel     Status: None (Preliminary result)   Collection Time: 03/19/17 10:40 AM  Result Value Ref Range Status   Specimen Description BLOOD LEFT HAND  Final   Special Requests IN PEDIATRIC BOTTLE Blood Culture adequate volume  Final   Culture   Final    NO GROWTH 2 DAYS Performed at [facility], [address] [place], [place] [address]    Report Status PENDING  Incomplete  Culture, blood (Routine X 2) w Reflex to ID Panel     Status: None (Preliminary result)   Collection Time: 03/19/17 10:50 AM  Result Value Ref Range Status   Specimen Description BLOOD LEFT ARM  Final   Special Requests IN PEDIATRIC BOTTLE Blood Culture adequate volume  Final   Culture   Final    NO GROWTH 2 DAYS Performed at [facility], [address] [place], [place] [address]    Report Status PENDING  Incomplete  Rapid Strep Screen (Not at [facility])     Status: None   Collection Time: 03/20/17  8:20 AM  Result Value Ref Range Status   Streptococcus, Group A Screen (Direct) NEGATIVE NEGATIVE Final    Comment: (NOTE) A Rapid Antigen test may result negative if the antigen level in the sample is below the detection level of this test. The FDA has not cleared this  test as a stand-alone test therefore the rapid antigen negative result has reflexed to a Group A Strep culture.   Culture, group A strep     Status: None (Preliminary result)   Collection Time: 03/20/17  8:20 AM  Result Value Ref Range Status   Specimen Description THROAT  Final   Special Requests NONE Reflexed from [id number]  Final   Culture   Final    CULTURE REINCUBATED  FOR BETTER GROWTH Performed at [facility], [address] [place], [place] [id number]    Report Status PENDING  Incomplete         Radiology Studies: Ct Soft Tissue Neck W Contrast  Result Date: 03/20/2017 CLINICAL DATA:  Left-sided neck mass and fever EXAM: CT NECK WITH CONTRAST TECHNIQUE: Multidetector CT imaging of the neck was performed using the standard protocol following the bolus administration of intravenous contrast. CONTRAST:  75mL ISOVUE-300 IOPAMIDOL (ISOVUE-300) INJECTION 61% COMPARISON:  None. FINDINGS: Pharynx and larynx: --Nasopharynx: Fossae of Rosenmuller are clear. Normal adenoid tonsils for age. --Oral cavity and oropharynx: The palatine and lingual tonsils are normal. The visible oral cavity and floor of mouth are normal. --Hypopharynx: Normal vallecula and pyriform sinuses. --Larynx: Normal epiglottis and pre-epiglottic space. Normal aryepiglottic and vocal folds. --Retropharyngeal space: No abscess, effusion or lymphadenopathy. Salivary glands: --Parotid: No mass lesion or inflammation. No sialolithiasis or ductal dilatation. --Submandibular: Reactive enlargement of left submandibular gland. Normal right submandibular gland. No sialolithiasis. --Sublingual: Normal. No ranula or other visible lesion of the base of tongue and floor of mouth. Thyroid: Normal. Lymph nodes: There are bilateral enlarged lymph nodes, predominantly within the left cervical chain. Left level II a nodes measure up to 13 mm. There are numerous subcentimeter bilateral level 5A and 5B nodes. There is a large amount of soft  tissue swelling of the left neck with inflammation of the left sternocleidomastoid muscle and inflammatory induration of the adjacent subcutaneous fat. There is thickening of the left aspect of the platysma. Inflammation extends into the left carotid and submandibular spaces. Vascular: Major cervical vessels are patent. Limited intracranial: Normal. Visualized orbits: Normal. Mastoids and visualized paranasal sinuses: No fluid levels or advanced mucosal thickening. No mastoid effusion. Skeleton: Soft tissue thickening at the right sternoclavicular joint is compatible with reported septic arthritis. Upper chest: Clear. Other: None. IMPRESSION: 1. Left sternocleidomastoid myositis with cellulitis of the adjacent subcutaneous fat and probable reactive thickening of the left platysma muscle. Inflammatory change also extends into the left submandibular and left carotid spaces. The origin of the process is not clear. If there has been no injection or penetrating injury in this area, then hematogenous spread of infection would be an alternative possibility. 2. No abscess or drainable fluid collection. 3. Reactive cervical lymphadenopathy, left worse than right, and reactive edema of the left submandibular gland. 4. Extension of inflammation to the left carotid space, but no vascular occlusion. 5. Soft tissue thickening at the right sternoclavicular joint, compatible with reported septic arthritis. Electronically Signed   By: Deatra RobinsonKevin  Herman M.D.   On: 03/20/2017 19:35   [place] Ekg Site Rite  Result Date: 03/21/2017 If Site Rite image not attached, placement could not be confirmed due to current cardiac rhythm.       Scheduled Meds: . buprenorphine-naloxone  2 tablet Sublingual Daily  . DULoxetine  60 mg Oral BID  . ferrous sulfate  325 mg Oral TID WC  . gadobenate dimeglumine  15 mL Intravenous Once  . multivitamin with minerals  1 tablet Oral Daily  . nicotine  7 mg Transdermal Q24H  . OLANZapine  5 mg Oral q  morning - 10a  . pantoprazole (PROTONIX) IV  40 mg Intravenous Q12H  . polyethylene glycol  17 g Oral Daily  . senna-docusate  1 tablet Oral BID   Continuous Infusions: . sodium chloride    . ceFEPime (MAXIPIME) IV Stopped (03/22/17 0650)     LOS: 37 days  Time spent: 30    Delaine Lame, MD [facility]  If 7PM-7AM, please contact night-coverage [email or web address] Password [email or web address] 03/22/2017, 9:55 AM

## 2017-03-22 NOTE — Progress Notes (Signed)
Progress Note   Subjective  No BM today Nurse noted blood tinge in urine and blood on the toilet tissue with wiping after urination Patient reports "few drops" of blood seen with last BM late yesterday No abd pain No nausea, vomiting Ate regular breakfast without issues Neck pain "a little" better  Abx changed from nafcillin to cefepime   Objective  Vital signs in last 24 hours: Temp:  [98.2 F (36.8 C)-102.8 F (39.3 C)] 101.5 F (38.6 C) (03/09 1031) Pulse Rate:  [53-132] 116 (03/09 0816) Resp:  [11-36] 26 (03/09 0916) BP: (82-111)/(52-96) 108/76 (03/09 0916) SpO2:  [92 %-100 %] 98 % (03/09 0616) Weight:  [172 lb 13.5 oz (78.4 kg)] 172 lb 13.5 oz (78.4 kg) (03/09 0440) Last BM Date: 03/22/17  Gen: awake, sleepy, but alert during conversation, ill-appearing but NAD HEENT: anicteric, op clear CV: tachy, regular Pulm: CTA b/l anteriorly Abd: soft, NT/ND, +BS throughout Ext: no c/c with trace b/l LE edema Neuro: nonfocal   Intake/Output from previous day: 03/08 0701 - 03/09 0700 In: 1394 [P.O.:444; I.V.:250; Blood:700] Out: -  Intake/Output this shift: No intake/output data recorded.  Lab Results: Recent Labs    03/20/17 0428 03/21/17 0411 03/21/17 2324 03/22/17 0224  WBC 0.4* 0.6*  --  0.7*  HGB 7.8* 6.4* 8.0* 7.4*  HCT 23.9* 19.3* 23.0* 21.8*  PLT 251 158  --  146*  147*   BMET Recent Labs    03/20/17 0428 03/21/17 0411 03/22/17 0224  NA 133* 135 132*  K 3.5 3.2* 3.5  CL 106 109 107  CO2 17* 17* 16*  GLUCOSE 104* 78 107*  BUN 16 11 10   CREATININE 0.94 0.82 0.82  CALCIUM 7.8* 8.0* 8.4*   LFT Recent Labs    03/22/17 0224  PROT 5.8*  ALBUMIN 1.8*  AST 11*  ALT 9*  ALKPHOS 69  BILITOT 2.0*   PT/INR Recent Labs    03/22/17 0224  LABPROT 17.0*  INR 1.40    Studies/Results: Ct Soft Tissue Neck W Contrast  Result Date: 03/20/2017 CLINICAL DATA:  Left-sided neck mass and fever EXAM: CT NECK WITH CONTRAST TECHNIQUE: Multidetector  CT imaging of the neck was performed using the standard protocol following the bolus administration of intravenous contrast. CONTRAST:  75mL ISOVUE-300 IOPAMIDOL (ISOVUE-300) INJECTION 61% COMPARISON:  None. FINDINGS: Pharynx and larynx: --Nasopharynx: Fossae of Rosenmuller are clear. Normal adenoid tonsils for age. --Oral cavity and oropharynx: The palatine and lingual tonsils are normal. The visible oral cavity and floor of mouth are normal. --Hypopharynx: Normal vallecula and pyriform sinuses. --Larynx: Normal epiglottis and pre-epiglottic space. Normal aryepiglottic and vocal folds. --Retropharyngeal space: No abscess, effusion or lymphadenopathy. Salivary glands: --Parotid: No mass lesion or inflammation. No sialolithiasis or ductal dilatation. --Submandibular: Reactive enlargement of left submandibular gland. Normal right submandibular gland. No sialolithiasis. --Sublingual: Normal. No ranula or other visible lesion of the base of tongue and floor of mouth. Thyroid: Normal. Lymph nodes: There are bilateral enlarged lymph nodes, predominantly within the left cervical chain. Left level II a nodes measure up to 13 mm. There are numerous subcentimeter bilateral level 5A and 5B nodes. There is a large amount of soft tissue swelling of the left neck with inflammation of the left sternocleidomastoid muscle and inflammatory induration of the adjacent subcutaneous fat. There is thickening of the left aspect of the platysma. Inflammation extends into the left carotid and submandibular spaces. Vascular: Major cervical vessels are patent. Limited intracranial: Normal. Visualized orbits: Normal. Mastoids and  visualized paranasal sinuses: No fluid levels or advanced mucosal thickening. No mastoid effusion. Skeleton: Soft tissue thickening at the right sternoclavicular joint is compatible with reported septic arthritis. Upper chest: Clear. Other: None. IMPRESSION: 1. Left sternocleidomastoid myositis with cellulitis of the  adjacent subcutaneous fat and probable reactive thickening of the left platysma muscle. Inflammatory change also extends into the left submandibular and left carotid spaces. The origin of the process is not clear. If there has been no injection or penetrating injury in this area, then hematogenous spread of infection would be an alternative possibility. 2. No abscess or drainable fluid collection. 3. Reactive cervical lymphadenopathy, left worse than right, and reactive edema of the left submandibular gland. 4. Extension of inflammation to the left carotid space, but no vascular occlusion. 5. Soft tissue thickening at the right sternoclavicular joint, compatible with reported septic arthritis. Electronically Signed   By: Deatra RobinsonKevin  Herman M.D.   On: 03/20/2017 19:35   [place] Ekg Site Rite  Result Date: 03/21/2017 If Site Rite image not attached, placement could not be confirmed due to current cardiac rhythm.     Assessment & Plan  32 year old female with IV drug use leading to MSSA bacteremia and tricuspid valve endocarditis now with sternoclavicular septic arthritis and neutropenia and dropping hemoglobin and platelet count  1.  Anemia/? GI blood loss -- multifactorial.  Most likely source of dropping blood counts is overwhelming infection but could also be secondary to bone marrow suppression related to nafcillin.  Cannot exclude GI blood loss, but no evidence for overt GI bleeding.  DIC panel was negative, fibrinogen was high.  INR is abnormal at 1.4.  Abdominal exam is benign. She received 2 units of packed cells yesterday and hemoglobin increased approximately 1 g/dL Platelets down slightly further today White count remains profoundly low 0.7, undetectable neutrophils  No evidence for active GI bleeding. No plan for upper or lower endoscopy --in light of no active bleeding and profound neutropenia. Monitor hemoglobin closely, hemoglobin 7.4, if drops less than 7.0 would repeat red cell  transfusion Hematology is following Continue BID PPI (more prophylactic, as no evidence for active upper GI bleeding)  2.  MSSA endocarditis/septic myositis and arthritis/sepsis --on cefepime.  Per primary team --Echo canceled yesterday due to persistent tachycardia      LOS: 37 days   Beverley FiedlerJay M Vernisha Bacote  03/22/2017, 10:34 AM

## 2017-03-22 NOTE — Progress Notes (Signed)
Echocardiogram 2D Echocardiogram has been performed.  [name] 03/22/2017, 2:22 PM

## 2017-03-22 NOTE — Progress Notes (Signed)
MD notified pts temp 101.5 pt and can not receive tylenol until 1434 will continue to monitor

## 2017-03-23 LAB — CBC WITH DIFFERENTIAL/PLATELET
Basophils Absolute: 0 K/uL (ref 0.0–0.1)
Basophils Relative: 0 %
Eosinophils Absolute: 0 K/uL (ref 0.0–0.7)
Eosinophils Relative: 0 %
HCT: 21 % — ABNORMAL LOW (ref 36.0–46.0)
Hemoglobin: 7.4 g/dL — ABNORMAL LOW (ref 12.0–15.0)
Lymphocytes Relative: 94 %
Lymphs Abs: 0.5 K/uL — ABNORMAL LOW (ref 0.7–4.0)
MCH: 30.3 pg (ref 26.0–34.0)
MCHC: 35.2 g/dL (ref 30.0–36.0)
MCV: 86.1 fL (ref 78.0–100.0)
Monocytes Absolute: 0 K/uL — ABNORMAL LOW (ref 0.1–1.0)
Monocytes Relative: 2 %
Neutro Abs: 0 K/uL — ABNORMAL LOW (ref 1.7–7.7)
Neutrophils Relative %: 4 %
Platelets: 171 K/uL (ref 150–400)
RBC: 2.44 MIL/uL — ABNORMAL LOW (ref 3.87–5.11)
RDW: 18.1 % — ABNORMAL HIGH (ref 11.5–15.5)
WBC: 0.5 K/uL — CL (ref 4.0–10.5)

## 2017-03-23 LAB — CBC
HCT: 20.5 % — ABNORMAL LOW (ref 36.0–46.0)
Hemoglobin: 7.2 g/dL — ABNORMAL LOW (ref 12.0–15.0)
MCH: 30.3 pg (ref 26.0–34.0)
MCHC: 35.1 g/dL (ref 30.0–36.0)
MCV: 86.1 fL (ref 78.0–100.0)
Platelets: 160 K/uL (ref 150–400)
RBC: 2.38 MIL/uL — ABNORMAL LOW (ref 3.87–5.11)
RDW: 18.4 % — ABNORMAL HIGH (ref 11.5–15.5)
WBC: 0.6 10*3/uL — CL (ref 4.0–10.5)

## 2017-03-23 LAB — BASIC METABOLIC PANEL
Anion gap: 10 (ref 5–15)
CO2: 19 mmol/L — ABNORMAL LOW (ref 22–32)
Chloride: 103 mmol/L (ref 101–111)
GFR calc Af Amer: >60 mL/min (ref >60)

## 2017-03-23 LAB — BASIC METABOLIC PANEL WITH GFR
BUN: 9 mg/dL (ref 6–20)
Calcium: 8.8 mg/dL — ABNORMAL LOW (ref 8.9–10.3)
Creatinine, Ser: 0.78 mg/dL (ref 0.44–1.00)
GFR calc non Af Amer: >60 mL/min (ref >60)
Glucose, Bld: 97 mg/dL (ref 65–99)
Potassium: 3.2 mmol/L — ABNORMAL LOW (ref 3.5–5.1)
Sodium: 132 mmol/L — ABNORMAL LOW (ref 135–145)

## 2017-03-23 MED ORDER — POTASSIUM CHLORIDE CRYS ER 20 MEQ PO TBCR
40.0000 meq | EXTENDED_RELEASE_TABLET | Freq: Once | ORAL | Status: AC
  Administered 2017-03-23: 40 meq via ORAL
  Filled 2017-03-23: qty 2

## 2017-03-23 MED ORDER — GUAIFENESIN ER 600 MG PO TB12
1200.0000 mg | ORAL_TABLET | Freq: Two times a day (BID) | ORAL | Status: DC | PRN
  Administered 2017-03-23 – 2017-03-24 (×2): 1200 mg via ORAL
  Filled 2017-03-23 (×2): qty 2

## 2017-03-23 NOTE — Progress Notes (Signed)
    Progress Note   Subjective  No new complaints this am Ate breakfast without abd pain, n/v 1 BM yesterday without blood in stools, pt reports seeing scant red blood with wiping Neck pain feels "some better" Had ECHO   Objective  Vital signs in last 24 hours: Temp:  [98.3 F (36.8 C)-103.1 F (39.5 C)] 100.1 F (37.8 C) (03/10 0428) Pulse Rate:  [113-130] 113 (03/09 1816) Resp:  [17-26] 19 (03/09 1816) BP: (92-119)/(57-84) 94/60 (03/09 1816) SpO2:  [91 %-99 %] 95 % (03/09 1816) Weight:  [174 lb 2.6 oz (79 kg)] 174 lb 2.6 oz (79 kg) (03/10 0500) Last BM Date: 03/22/17  Gen: awake, alert, chronically ill appearing, but in NAD HEENT: anicteric, op clear, left neck swelling with erythema CV: tachy Pulm: CTA b/l anteriorly Abd: soft, NT/ND, +BS throughout Ext: no c/c, trace b/l edema Neuro: nonfocal   Intake/Output from previous day: 03/09 0701 - 03/10 0700 In: 100 [IV Piggyback:100] Out: -  Intake/Output this shift: No intake/output data recorded.  Lab Results: Recent Labs    03/21/17 0411 03/21/17 2324 03/22/17 0224 03/23/17 0418  WBC 0.6*  --  0.7* 0.6*  HGB 6.4* 8.0* 7.4* 7.2*  HCT 19.3* 23.0* 21.8* 20.5*  PLT 158  --  146*  147* 160   BMET Recent Labs    03/21/17 0411 03/22/17 0224 03/23/17 0418  NA 135 132* 132*  K 3.2* 3.5 3.2*  CL 109 107 103  CO2 17* 16* 19*  GLUCOSE 78 107* 97  BUN 11 10 9   CREATININE 0.82 0.82 0.78  CALCIUM 8.0* 8.4* 8.8*   LFT Recent Labs    03/22/17 0224  PROT 5.8*  ALBUMIN 1.8*  AST 11*  ALT 9*  ALKPHOS 69  BILITOT 2.0*   PT/INR Recent Labs    03/22/17 0224  LABPROT 17.0*  INR 1.40      Assessment & Plan  32 year old female with IV drug use leading to MSSA bacteremia and tricuspid valve endocarditis now with sternoclavicular septic arthritis and neutropenia, anemia  1.  Anemia/? GI blood loss -- multifactorial.  No overt bleeding and Hgb now stable (7.2 and then 7.4 when checked again this am).   This argues against active GI bleeding. --anemia most likely secondary to severe systemic infection, bone marrow suppression due to the same or perhaps antibiotics.   --No abd complaint to warrant CT abd/pelvis at this time --Remain lymphopenic, and most notably neutropenic (yesterday neutrophils undetectable)  No evidence for active GI bleeding. No plan for upper or lower endoscopy -- particularly without overt bleeding in setting of neuropenia Monitor hemoglobin closely, hemoglobin 7.4, if drops less than 7.0 would repeat red cell transfusion Hematology has been involved Continue BID PPI (more prophylactic, as no evidence for active upper GI bleeding)  2.  MSSA endocarditis/septic myositis and arthritis/sepsis --on cefepime.  Per primary team --Echo canceled yesterday due to persistent tachycardia  GI will sign off, please call with questions          LOS: 38 days   Carie CaddyJay M Harace Mccluney  03/23/2017, 8:08 AM

## 2017-03-23 NOTE — Progress Notes (Signed)
CRITICAL VALUE ALERT  Critical Value:  WBC 0.5  Date & Time Notied:  03/23/2017 0855  Provider Notified: DR purohit

## 2017-03-23 NOTE — Progress Notes (Signed)
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  Patient was not felt to be a candidate for valve replacement at this time due to her ongoing drug use.  She was also noted to have right sternoclavicular septic arthritis.  She was initially maintained on IV nafcillin to complete a total of 6-8 weeks of IV antibiotics as she was not safe to discharge home with PICC line and no facility was willing to accept the patient.  Her course has been complicated by an episode on 03/19/2017 of sepsis physiology with worsening pancytopenia and neutropenia for which hematology was consulted and is following as well as worsening cellulitis and myositis of the left neck and shoulder area.  With her progressive anemia, prior history of GI bleed, and reports of small amounts of blood in her stool and dark stools GI was consulted but felt that patient was not having lower GI bleed.  Assessment & Plan:   Principal Problem:   MSSA bacteremia Active Problems:   Sepsis affecting skin ([place])   Cellulitis of left hand   Staphylococcus aureus bacteremia with sepsis (HCC)   Weakness of both lower extremities   Pain of right clavicle   IVDU (intravenous drug user)   Septic arthritis of right sternoclavicular joint (HCC)   PFO (patent foramen ovale)   Tricuspid valve vegetation   Chronic diastolic CHF (congestive heart failure) (HCC)   Blood per rectum   Hematuria   #) MSSA tricuspid valve endocarditis: While initially the patient was improving she now has complications of her metastatic disease including right sternoclavicular septic arthritis and left  sternocleidomastoid myositis and spreading infection around the neck area.  Her septic arthritis on the right has improved and her myositis on the left is diminishing in swelling and pain. -repeat echo showed tricuspid valve vegetation and damage to tricuspid valve which was not seen in prior TTE February -PICC line was replaced on 03/21/2017 due to difficult access -We will consider CT of the chest to evaluate for neck infection extending into her mediastinum -Repeat blood cultures pending -ID following appreciate t recommendations - Continue cefazolin started 03/22/2017, cefepime was from 03/19/2017 2 03/22/2017, IV nafcillin was from 02/21/2017 to 03/19/2017  #) Profound neutropenia and lymphopenia: Per hematology possibly related to a reaction to her uncontrolled infection.  Nafcillin was discontinued and cefepime was started due to concern about possible nafcillin related bone marrow suppression.  At this time however patient continues to have profound neutropenia of unclear etiology along with mild thrombocytopenia and anemia.  Prior imaging of the CT of the chest does show an enlarged spleen as well. -hematology following appreciate recommendations -No need for Neupogen at this time though they will reconsider pending patient's clinical course -Pending lymphocyte enumeration panel -CT-guided bone marrow biopsy ordered for 03/24/2017, n.p.o. at midnight  #) Anemia: Multifactorial including iron deficiency, bone marrow suppression from sepsis/endocarditis and possibly nafcillin. She is status post transfusion of 2 units packed red blood cells on 03/21/2017.  She did have some blood in her stools however GI did not feel that this was in the setting of an active GI bleed.  Per above pending CT-guided bone  marrow biopsy -DIC panel shows no evidence of schistocytes and fibrinogen is quite elevated -Continue pantoprazole IV 40 mg twice daily -Per GI they did not recommend any endoscopy or colonoscopy unless more  overt bleeding was present.  They also recommended cross-sectional imaging of the abdomen and pelvis if she began bleeding again.  #) Psych/pain: -Continue Suboxone -Continue cyclobenzaprine 10 mg 3 times daily as needed -Continue duloxetine 60 mg twice daily  -continue alprazolam 0.5 mg 4 times daily as needed Continue oxycodone 5 mg every 4 hours as needed Continue olanzapine 5 mg every morning  Fluids: Tolerating p.o. Electrodes: Monitor and supplement Nutrition: Regular diet, n.p.o. at midnight for CT-guided biopsy of bone marrow  Prophylaxis: We will place SCDs as patient has been bedbound in the setting of feeling quite ill with recurrent metastatic disease  Disposition: Pending completion of IV antibiotics and resolution of multiple metastatic infectious complications  Full code  Consultants:   Infectious disease  Heme  Cardiology  CT Surgery  Gastroenterology  Procedures: Echo 03/21/2017: - Left ventricle: The cavity size was normal. Wall thickness was   increased in a pattern of mild LVH. Systolic function was normal.   The estimated ejection fraction was in the range of 50% to 55%.   Wall motion was normal; there were no regional wall motion   abnormalities. - Tricuspid valve: There is a small semi mobile density attached to   the atrial side of the anterior tricuspid valve leaflet. There   looks to be at least partial prolapse of a portion of the   anterior leaflet, with associated regurgitaton. The TR is   eccentric and posterior, may be underestimated. There was   moderate-severe regurgitation. - Limited echo to evaluate TV vegetation.  TEE 02/20/2017:- Tricuspid valve endocarditis with moderate to severe TR,   ?perforated leaflet. There was a large PFO, putting the patient   at risk for spread of infection from venous circulation to   arterial circulation and putting the mitral and aortic valves at higher risk.  Echo 02/14/2017: - LVEF 60-65%, normal wall  thickness, normal wall motion, grade 1   DD, normal LV filling pressure, normal LA size, moderate RAE mild TR, RVSP 46 mmHg, small IVC suggestive of volume depletion.  PICC placed 03/21/2017  Antimicrobials: IV Ancef 2/-2/8 Zyvox 2/5-2/6, 2/17-/18 IV nafcillin 2/8-3/6 IV cefepime 3/6-3/9 IV cefazolin 3/9 to ongoing    Subjective: Patient reports that her left-sided neck swelling and pain has improved.  She reports just some nasal congestion this morning.  Objective: Vitals:   03/23/17 0428 03/23/17 0500 03/23/17 0817 03/23/17 0916  BP:   108/82 115/87  Pulse:   (!) 109 (!) 113  Resp:   20 19  Temp: 100.1 F (37.8 C)  100.1 F (37.8 C)   TempSrc:   Oral   SpO2:   99% 98%  Weight:  79 kg (174 lb 2.6 oz)    Height:        Intake/Output Summary (Last 24 hours) at 03/23/2017 1025 Last data filed at 03/22/2017 1806 Gross per 24 hour  Intake 100 ml  Output -  Net 100 ml   Filed Weights   03/21/17 0639 03/22/17 0440 03/23/17 0500  Weight: 79.2 kg (174 lb 9.7 oz) 78.4 kg (172 lb 13.5 oz) 79 kg (174 lb 2.6 oz)    Examination:  General exam: No acute distress, sleeping in bed Respiratory system: Clear to auscultation. Respiratory effort normal rhonchi. Cardiovascular system: Tachycardic, 2 out of 6  systolic murmur heard across precordium, bounding pulses Gastrointestinal system: Abdomen is nondistended, soft and nontender. No organomegaly or masses felt. Normal bowel sounds heard. Central nervous system: Alert and oriented. No focal neurological deficits. Extremities: Trace lower extremity edema Skin: Over left neck and submandibular region noted to have large area of erythema induration and edema that is mildly tender to palpation, area has diminished in pain and edema since yesterday Psychiatry: Judgement and insight appear normal. Mood & affect appropriate.     Data Reviewed: I have personally reviewed following labs and imaging studies  CBC: Recent Labs  Lab  03/19/17 1048 03/20/17 0428 03/21/17 0411 03/21/17 2324 03/22/17 0224 03/23/17 0418 03/23/17 0737  WBC  --  0.4* 0.6*  --  0.7* 0.6* 0.5*  NEUTROABS 0.0* 0.0* 0.0*  --  0.0*  --  PENDING  HGB  --  7.8* 6.4* 8.0* 7.4* 7.2* 7.4*  HCT  --  23.9* 19.3* 23.0* 21.8* 20.5* 21.0*  MCV  --  90.9 87.7  --  86.5 86.1 86.1  PLT  --  251 158  --  146*  147* 160 416   Basic Metabolic Panel: Recent Labs  Lab 03/19/17 0426 03/20/17 0428 03/21/17 0411 03/22/17 0224 03/23/17 0418  NA 136 133* 135 132* 132*  K 3.9 3.5 3.2* 3.5 3.2*  CL 106 106 109 107 103  CO2 19* 17* 17* 16* 19*  GLUCOSE 94 104* 78 107* 97  BUN '15 16 11 10 9  ' CREATININE 0.80 0.94 0.82 0.82 0.78  CALCIUM 8.8* 7.8* 8.0* 8.4* 8.8*  MG  --   --   --  1.6*  --    GFR: Estimated Creatinine Clearance: 109.3 mL/min (by C-G formula based on SCr of 0.78 mg/dL). Liver Function Tests: Recent Labs  Lab 03/22/17 0224  AST 11*  ALT 9*  ALKPHOS 69  BILITOT 2.0*  PROT 5.8*  ALBUMIN 1.8*   No results for input(s): LIPASE, AMYLASE in the last 168 hours. No results for input(s): AMMONIA in the last 168 hours. Coagulation Profile: Recent Labs  Lab 03/22/17 0224  INR 1.40   Cardiac Enzymes: No results for input(s): CKTOTAL, CKMB, CKMBINDEX, TROPONINI in the last 168 hours. BNP (last 3 results) No results for input(s): PROBNP in the last 8760 hours. HbA1C: No results for input(s): HGBA1C in the last 72 hours. CBG: No results for input(s): GLUCAP in the last 168 hours. Lipid Profile: No results for input(s): CHOL, HDL, LDLCALC, TRIG, CHOLHDL, LDLDIRECT in the last 72 hours. Thyroid Function Tests: No results for input(s): TSH, T4TOTAL, FREET4, T3FREE, THYROIDAB in the last 72 hours. Anemia Panel: No results for input(s): VITAMINB12, FOLATE, FERRITIN, TIBC, IRON, RETICCTPCT in the last 72 hours. Sepsis Labs: Recent Labs  Lab 03/19/17 1536 03/19/17 1541  PROCALCITON 2.49  --   LATICACIDVEN  --  1.0    Recent Results  (from the past 240 hour(s))  Culture, blood (Routine X 2) w Reflex to ID Panel     Status: None (Preliminary result)   Collection Time: 03/19/17 10:40 AM  Result Value Ref Range Status   Specimen Description BLOOD LEFT HAND  Final   Special Requests IN PEDIATRIC BOTTLE Blood Culture adequate volume  Final   Culture   Final    NO GROWTH 3 DAYS Performed at [facility], [place] [address] [place], [place] [address]    Report Status PENDING  Incomplete  Culture, blood (Routine X 2) w Reflex to ID Panel  Status: None (Preliminary result)   Collection Time: 03/19/17 10:50 AM  Result Value Ref Range Status   Specimen Description BLOOD LEFT ARM  Final   Special Requests IN PEDIATRIC BOTTLE Blood Culture adequate volume  Final   Culture   Final    NO GROWTH 3 DAYS Performed at [facility], [address] [place], [place] [address]    Report Status PENDING  Incomplete  Rapid Strep Screen (Not at [facility])     Status: None   Collection Time: 03/20/17  8:20 AM  Result Value Ref Range Status   Streptococcus, Group A Screen (Direct) NEGATIVE NEGATIVE Final    Comment: (NOTE) A Rapid Antigen test may result negative if the antigen level in the sample is below the detection level of this test. The FDA has not cleared this test as a stand-alone test therefore the rapid antigen negative result has reflexed to a Group A Strep culture.   Culture, group A strep     Status: None   Collection Time: 03/20/17  8:20 AM  Result Value Ref Range Status   Specimen Description THROAT  Final   Special Requests NONE Reflexed from [id number]  Final   Culture   Final    NO GROUP A STREP (S.PYOGENES) ISOLATED Performed at [facility], [address], [place], [place] [address]    Report Status 03/22/2017 FINAL  Final         Radiology Studies: [place] Ekg Site Rite  Result Date: 03/21/2017 If Site Rite image not attached, placement could not be confirmed due to current cardiac  rhythm.       Scheduled Meds: . buprenorphine-naloxone  2 tablet Sublingual Daily  . DULoxetine  60 mg Oral BID  . ferrous sulfate  325 mg Oral TID WC  . gadobenate dimeglumine  15 mL Intravenous Once  . multivitamin with minerals  1 tablet Oral Daily  . nicotine  7 mg Transdermal Q24H  . OLANZapine  5 mg Oral q morning - 10a  . pantoprazole (PROTONIX) IV  40 mg Intravenous Q12H  . polyethylene glycol  17 g Oral Daily  . senna-docusate  1 tablet Oral BID   Continuous Infusions: . sodium chloride    .  ceFAZolin (ANCEF) IV Stopped (03/23/17 0619)     LOS: 38 days    Time spent: [place], MD [facility]  If 7PM-7AM, please contact night-coverage [email or web address] Password [facility] 03/23/2017, 10:25 AM

## 2017-03-23 NOTE — Progress Notes (Addendum)
IR asked to perform BM biopsy tomorrow. Chart reviewed. Pt too sedated/sleeepy to discuss procedure. Attempted to call mother, no answer. Will need to consent family. IR PA/team will see tomorrow am. Tentative plan for procedure 3/11 at ~1000   Brayton ElKevin Dema Timmons PA-C Interventional Radiology 03/23/2017 12:45 PM

## 2017-03-23 NOTE — Progress Notes (Signed)
MD notified pts temp 103, pt lethargic and is oriented x4  will continue to monitor

## 2017-03-24 ENCOUNTER — Inpatient Hospital Stay (HOSPITAL_COMMUNITY): Payer: Medicaid Other

## 2017-03-24 DIAGNOSIS — L0390 Cellulitis, unspecified: Secondary | ICD-10-CM

## 2017-03-24 LAB — BASIC METABOLIC PANEL
Anion gap: 10 (ref 5–15)
BUN: 7 mg/dL (ref 6–20)
CO2: 20 mmol/L — ABNORMAL LOW (ref 22–32)
Calcium: 8.9 mg/dL (ref 8.9–10.3)
Chloride: 103 mmol/L (ref 101–111)
GFR calc Af Amer: >60 mL/min (ref >60)
Potassium: 3.2 mmol/L — ABNORMAL LOW (ref 3.5–5.1)

## 2017-03-24 LAB — CBC
HCT: 23 % — ABNORMAL LOW (ref 36.0–46.0)
Hemoglobin: 7.8 g/dL — ABNORMAL LOW (ref 12.0–15.0)
MCH: 28.9 pg (ref 26.0–34.0)
MCHC: 33.9 g/dL (ref 30.0–36.0)
MCV: 85.2 fL (ref 78.0–100.0)
PLATELETS: 212 10*3/uL (ref 150–400)
RBC: 2.7 MIL/uL — ABNORMAL LOW (ref 3.87–5.11)
RDW: 17.7 % — AB (ref 11.5–15.5)
WBC: 0.9 10*3/uL — AB (ref 4.0–10.5)

## 2017-03-24 LAB — CULTURE, BLOOD (ROUTINE X 2)
CULTURE: NO GROWTH
Culture: NO GROWTH
SPECIAL REQUESTS: ADEQUATE
SPECIAL REQUESTS: ADEQUATE

## 2017-03-24 LAB — PATHOLOGIST SMEAR REVIEW

## 2017-03-24 LAB — BASIC METABOLIC PANEL WITH GFR
Creatinine, Ser: 0.75 mg/dL (ref 0.44–1.00)
GFR calc non Af Amer: >60 mL/min (ref >60)
Glucose, Bld: 87 mg/dL (ref 65–99)
Sodium: 133 mmol/L — ABNORMAL LOW (ref 135–145)

## 2017-03-24 MED ORDER — FENTANYL CITRATE (PF) 100 MCG/2ML IJ SOLN
INTRAMUSCULAR | Status: AC | PRN
  Administered 2017-03-24: 50 ug via INTRAVENOUS
  Administered 2017-03-24: 25 ug via INTRAVENOUS
  Administered 2017-03-24: 50 ug via INTRAVENOUS
  Administered 2017-03-24: 25 ug via INTRAVENOUS

## 2017-03-24 MED ORDER — MIDAZOLAM HCL 2 MG/2ML IJ SOLN
INTRAMUSCULAR | Status: AC | PRN
  Administered 2017-03-24 (×4): 1 mg via INTRAVENOUS

## 2017-03-24 MED ORDER — FENTANYL CITRATE (PF) 100 MCG/2ML IJ SOLN
INTRAMUSCULAR | Status: AC
  Filled 2017-03-24: qty 4

## 2017-03-24 MED ORDER — MORPHINE SULFATE (PF) 4 MG/ML IV SOLN
2.0000 mg | INTRAVENOUS | Status: DC | PRN
  Administered 2017-03-24: 4 mg via INTRAVENOUS
  Administered 2017-03-24: 3 mg via INTRAVENOUS
  Administered 2017-03-24: 4 mg via INTRAVENOUS
  Filled 2017-03-24 (×3): qty 1

## 2017-03-24 MED ORDER — MIDAZOLAM HCL 2 MG/2ML IJ SOLN
INTRAMUSCULAR | Status: AC
  Filled 2017-03-24: qty 4

## 2017-03-24 MED ORDER — LIDOCAINE HCL 1 % IJ SOLN
INTRAMUSCULAR | Status: AC
  Filled 2017-03-24: qty 20

## 2017-03-24 NOTE — Progress Notes (Signed)
Events noted in the last 48 hours. Patient's appears to be clinically stable.  Labs from the last few days noted and appears to be overall stable. Hemoglobin continues to be around 7.4 with a white cell count of 0.5 and around 4% neutrophils. Peripheral smear did not show any evidence of dysplasia or schistocytes.  Assessment and recommendation:  1. Leukocytopenia and isolated neutropenia: The etiology is related to acute sepsis versus antibiotic-related effect possibly from nafcillin. I recommend continued observation and monitoring her white cell count. If this is an antibiotic-related phenomenon, it we'll take up to 7-10 days after stopping the medication to see recovery. I see no need for growth factor support at this time.  2. Anemia: She has an element of mild GI bleed and chronic myelosuppression from sepsis. Iron studies from February 2019 showed chronic disease element of anemia.  I have no objections to performing a bone marrow biopsy although I did not request it.   We will continue to follow with you.

## 2017-03-24 NOTE — Procedures (Signed)
Interventional Radiology Procedure Note  Procedure: CT guided bone marrow aspiration and biopsy  Complications: None  EBL: < 10 mL  Findings: Aspirate and core biopsy performed of bone marrow in right iliac bone.  Plan: Bedrest supine x 1 hrs  Demaryius Imran T. Biff Rutigliano, M.D Pager:  [phone]   

## 2017-03-24 NOTE — Progress Notes (Signed)
Nutrition Follow-up  DOCUMENTATION CODES:   Not applicable  INTERVENTION:  Premier Protein BID each supplement provides 160 calories and 30 grams of protein  NUTRITION DIAGNOSIS:   Inadequate oral intake related to lethargy/confusion, poor appetite as evidenced by meal completion < 50%. -worsening with new metastatic infection  GOAL:   Patient will meet greater than or equal to 90% of their needs -unmet  MONITOR:   PO intake, Supplement acceptance, Labs, Weight trends, Skin, I & O's  ASSESSMENT:   32 y.o. female w/ a hx of fibromyalgia, anemia, and IV drug abuse who presented with a few days history of fever, chills, generalized weakness muscle aches, and arthralgias.  In the emergency room the patient was noted to have fever, leukocytosis and left hand and bilateral lower extremity redness and petechiae.    Spoke with patient's mother, patient was in CT during time of visit. Noted 3/7 diagnosed with sternoclavicular septic arthritis Hemoglobin has been trending downwards, exhibiting some hematochezia per MD, currently unsure of source. Underwent bone marrow biopsy today. Per mother, patient has been having trouble swallowing since expansion of infection to her sternoclavicular area. She could not consume pudding yesterday, but was able to consume applesauce. Also complains of pain. Mom states patient chokes with first sip of drinks but is able to swallow following the initial sip. PO intake has decreased as a result, not eating much per Mom.  Meal Completion: 35-45%; still has snacks from outside, unsure of consumption however.  PICC replaced 3/8  Labs reviewed:  Na 133, K+ 3.2, Mg 1.6,  Medications reviewed and include:  Iron, MVI w/ Minerals, Miralax, Senokot-S   NUTRITION - FOCUSED PHYSICAL EXAM:    Most Recent Value  Orbital Region  No depletion  Upper Arm Region  No depletion  Thoracic and Lumbar Region  No depletion  Buccal Region  No depletion  Temple Region   No depletion  Clavicle Bone Region  No depletion  Clavicle and Acromion Bone Region  No depletion  Scapular Bone Region  No depletion  Dorsal Hand  No depletion  Patellar Region  No depletion  Anterior Thigh Region  No depletion  Posterior Calf Region  No depletion  Edema (RD Assessment)  Mild  Hair  Reviewed  Eyes  Reviewed  Mouth  Reviewed  Skin  Reviewed  Nails  Reviewed       Diet Order:  Diet NPO time specified Except for: Ice Chips, Sips with Meds  EDUCATION NEEDS:   Education needs have been addressed  Skin:  Skin Assessment: Reviewed RN Assessment  Last BM:  03/23/2017  Height:   Ht Readings from Last 1 Encounters:  03/11/17 _0  (1.702 m)    Weight:   Wt Readings from Last 1 Encounters:  03/24/17 173 lb 15.1 oz (78.9 kg)    Ideal Body Weight:  61.4 kg  BMI:  Body mass index is 27.24 kg/m.  Estimated Nutritional Needs:   Kcal:  1700-1900  Protein:  90-105 grams  Fluid:  1.7-1.9 L  Satira Anis. Bernadette Armijo, MS, RD LDN Inpatient Clinical Dietitian Pager [phone]

## 2017-03-24 NOTE — Progress Notes (Signed)
MD was informed about patient's HR. No new orders at this time. Will continue to monitor.

## 2017-03-24 NOTE — Sedation Documentation (Signed)
Patient is resting comfortably. 

## 2017-03-24 NOTE — Progress Notes (Signed)
Patient back from IR. Alert and oriented, complaining of pain in her lower back, she received pain medicine per PRN order. No SOB noted; dressing on lower back clean, dry, minimal draininge. Will continue to monitor.

## 2017-03-24 NOTE — Consult Note (Signed)
Chief Complaint: Patient was seen in consultation today for Bone marrow biopsy Chief Complaint  Patient presents with  . Weakness   at the request of Dr Dahlia Byes    Supervising Physician: Aletta Edouard  Patient Status: [facility] - In-pt  History of Present Illness: [name] is a 32 y.o. female   Hx IV drug abuse Fibromyalgia Anemia Presented to ED 1/31 fever; chills; weakness +MSSA bacteremia Tricuspid valve endocarditis Rt Sternoclavicular septic arthritis IV antibiotics 6 weeks through PICC-- (not safe to send home with PICC)  Now worsening pancytopenia; neutropenia  Dr Alen Blew consulted Recommending BM bx Scheduled for same today     Past Medical History:  Diagnosis Date  . Anemia, unspecified   . Anxiety   . Asthma   . Disc herniation    causes sciatica unsure which disc  . Esophageal reflux   . Fatty liver   . Fibromyalgia   . Gastroparesis   . Headache(784.0)   . Heart murmur   . History of narcotic addiction ([place]) 09/30/2012   2010:  Per pt, was addicted to narcotics; mom helped intervene and stopped taking opiods   . Hyperemesis gravidarum with metabolic disturbance, unspecified as to episode of care   . Irritable bowel syndrome   . MSSA bacteremia 02/20/2017  . Other and unspecified noninfectious gastroenteritis and colitis(558.9)   . Pregnant   . PTSD (post-traumatic stress disorder)   . Septic arthritis of right sternoclavicular joint ([place]) 02/20/2017  . Unspecified asthma(493.90)     Past Surgical History:  Procedure Laterality Date  . APPENDECTOMY    . CHOLECYSTECTOMY    . COLONOSCOPY    . LAPAROSCOPIC BILATERAL SALPINGECTOMY Bilateral 12/08/2012   Procedure: LAPAROSCOPIC BILATERAL SALPINGECTOMY;  Surgeon: Jonnie Kind, MD;  Location: [facility];  Service: Gynecology;  Laterality: Bilateral;  . MULTIPLE EXTRACTIONS WITH ALVEOLOPLASTY N/A 12/01/2015   Procedure: EXTRACTION TEETH TWO, THREE, FOUR, SIX, SEVEN, EIGHT, NINE, TEN, ELEVEN,  TWELVE, FOURTEEN, FIFTEEN, TWENTY, TWENTY ONE, TWENTY EIGHT, TWENTY NINE, THIRTY AND THIRTY ONE WITH ALVEOLOPLASTY;  Surgeon: Diona Browner, DDS;  Location: [place];  Service: Oral Surgery;  Laterality: N/A;  . TEE WITHOUT CARDIOVERSION N/A 02/20/2017   Procedure: TRANSESOPHAGEAL ECHOCARDIOGRAM (TEE);  Surgeon: Larey Dresser, MD;  Location: [facility] ENDOSCOPY;  Service: Cardiovascular;  Laterality: N/A;  . TUBAL LIGATION      Allergies: Azithromycin; Nsaids; Hydrocodone; Penicillins; and Prednisone  Medications: Prior to Admission medications   Medication Sig Start Date End Date Taking? Authorizing Provider  acetaminophen (TYLENOL) 500 MG tablet Take 1,000 mg by mouth every 6 (six) hours as needed for mild pain or moderate pain.   Yes [provider]  ALPRAZolam (XANAX) 0.5 MG tablet Take 1 tablet (0.5 mg total) by mouth 3 (three) times daily as needed for anxiety. Patient taking differently: Take 0.5 mg by mouth 4 (four) times daily.  05/29/14  Yes Nat Christen, MD  Buprenorphine HCl-Naloxone HCl (SUBOXONE) 4-1 MG FILM Place 2 Film under the tongue daily.    Yes [provider]  DULoxetine (CYMBALTA) 60 MG capsule Take 60 mg by mouth 2 (two) times daily.   Yes [provider]  Multiple Vitamin (MULTIVITAMIN WITH MINERALS) TABS tablet Take 1 tablet by mouth daily. ALL NATURAL VITAMIN: Made by [employer] Staff (system six)   Yes [provider]  OLANZapine (ZYPREXA) 5 MG tablet Take 1 tablet by mouth every morning.   Yes [provider]  ondansetron (ZOFRAN) 4 MG tablet Take 1 tablet (4  mg total) by mouth every 6 (six) hours. For nausea or vomiting 02/11/17  Yes Lily Kocher, PA-C  cyclobenzaprine (FLEXERIL) 10 MG tablet Take 1 tablet (10 mg total) by mouth 3 (three) times daily. 02/11/17   Lily Kocher, PA-C     Family History  Problem Relation Age of Onset  . Ulcerative colitis Paternal Grandmother   . Cancer Paternal Grandmother   . Colon polyps Paternal  Grandfather   . Cancer Paternal Grandfather        thyroid  . Diabetes Father   . Heart disease Father   . Kidney disease Father   . Hypertension Father     Social History   Socioeconomic History  . Marital status: Single    Spouse name: None  . Number of children: 2  . Years of education: None  . Highest education level: None  Social Needs  . Financial resource strain: None  . Food insecurity - worry: None  . Food insecurity - inability: None  . Transportation needs - medical: None  . Transportation needs - non-medical: None  Occupational History  . Occupation: CNA  Tobacco Use  . Smoking status: Current Every Day Smoker    Packs/day: 2.00    Years: 10.00    Pack years: 20.00    Types: Cigarettes  . Smokeless tobacco: Never Used  Substance and Sexual Activity  . Alcohol use: No  . Drug use: No    Comment: pt denies 11/30/15  . Sexual activity: No    Birth control/protection: Surgical  Other Topics Concern  . None  Social History Narrative   Daily caffeine     Review of Systems: A 12 point ROS discussed and pertinent positives are indicated in the HPI above.  All other systems are negative.  Review of Systems  Constitutional: Positive for activity change, appetite change, chills, fatigue and fever.  Respiratory: Negative for shortness of breath.   Cardiovascular: Negative for chest pain.  Gastrointestinal: Negative for abdominal pain.  Neurological: Positive for weakness.  Psychiatric/Behavioral: Positive for confusion.    Vital Signs: BP 118/81   Pulse (!) 110   Temp 98.5 F (36.9 C) (Oral)   Resp (!) 22   Ht '5\' 7"'  (1.702 m)   Wt 173 lb 15.1 oz (78.9 kg)   LMP 02/09/2017 (LMP Unknown)   SpO2 97%   BMI 27.24 kg/m   Physical Exam  Cardiovascular: Normal rate, regular rhythm and normal heart sounds.  Pulmonary/Chest: Effort normal and breath sounds normal.  Abdominal: Soft. Bowel sounds are normal.  Musculoskeletal: Normal range of motion.    Neurological: She is alert.  Skin: Skin is warm and dry.  Psychiatric: She has a normal mood and affect. Her behavior is normal.  Answers most questions Knows name and dob; place and year Cannot tell me [place] Very groggy; lethargic  Nursing note and vitals reviewed.   Imaging: Dg Shoulder Right  Result Date: 03/14/2017 CLINICAL DATA:  Anterior and posterior RIGHT shoulder pain, septic RIGHT sternoclavicular joint since 02/13/2017, smoker EXAM: RIGHT SHOULDER - 2+ VIEW COMPARISON:  RIGHT shoulder MRI 02/27/2017 FINDINGS: Osseous mineralization normal. AC joint alignment normal. No glenohumeral fracture, dislocation, or bone destruction. Visualized ribs intact. RIGHT arm PICC line noted. IMPRESSION: No acute osseous abnormalities. Electronically Signed   By: Lavonia Dana M.D.   On: 03/14/2017 10:23   Ct Soft Tissue Neck W Contrast  Result Date: 03/20/2017 CLINICAL DATA:  Left-sided neck mass and fever EXAM: CT NECK WITH CONTRAST  TECHNIQUE: Multidetector CT imaging of the neck was performed using the standard protocol following the bolus administration of intravenous contrast. CONTRAST:  13m ISOVUE-300 IOPAMIDOL (ISOVUE-300) INJECTION 61% COMPARISON:  None. FINDINGS: Pharynx and larynx: --Nasopharynx: Fossae of Rosenmuller are clear. Normal adenoid tonsils for age. --Oral cavity and oropharynx: The palatine and lingual tonsils are normal. The visible oral cavity and floor of mouth are normal. --Hypopharynx: Normal vallecula and pyriform sinuses. --Larynx: Normal epiglottis and pre-epiglottic space. Normal aryepiglottic and vocal folds. --Retropharyngeal space: No abscess, effusion or lymphadenopathy. Salivary glands: --Parotid: No mass lesion or inflammation. No sialolithiasis or ductal dilatation. --Submandibular: Reactive enlargement of left submandibular gland. Normal right submandibular gland. No sialolithiasis. --Sublingual: Normal. No ranula or other visible lesion of the base of tongue and  floor of mouth. Thyroid: Normal. Lymph nodes: There are bilateral enlarged lymph nodes, predominantly within the left cervical chain. Left level II a nodes measure up to 13 mm. There are numerous subcentimeter bilateral level 5A and 5B nodes. There is a large amount of soft tissue swelling of the left neck with inflammation of the left sternocleidomastoid muscle and inflammatory induration of the adjacent subcutaneous fat. There is thickening of the left aspect of the platysma. Inflammation extends into the left carotid and submandibular spaces. Vascular: Major cervical vessels are patent. Limited intracranial: Normal. Visualized orbits: Normal. Mastoids and visualized paranasal sinuses: No fluid levels or advanced mucosal thickening. No mastoid effusion. Skeleton: Soft tissue thickening at the right sternoclavicular joint is compatible with reported septic arthritis. Upper chest: Clear. Other: None. IMPRESSION: 1. Left sternocleidomastoid myositis with cellulitis of the adjacent subcutaneous fat and probable reactive thickening of the left platysma muscle. Inflammatory change also extends into the left submandibular and left carotid spaces. The origin of the process is not clear. If there has been no injection or penetrating injury in this area, then hematogenous spread of infection would be an alternative possibility. 2. No abscess or drainable fluid collection. 3. Reactive cervical lymphadenopathy, left worse than right, and reactive edema of the left submandibular gland. 4. Extension of inflammation to the left carotid space, but no vascular occlusion. 5. Soft tissue thickening at the right sternoclavicular joint, compatible with reported septic arthritis. Electronically Signed   By: KUlyses JarredM.D.   On: 03/20/2017 19:35   Ct Angio Chest Pe W Or Wo Contrast  Result Date: 02/25/2017 CLINICAL DATA:  Acute onset of pleuritic chest pain and shortness of breath. Elevated D-dimer. EXAM: CT ANGIOGRAPHY CHEST  WITH CONTRAST TECHNIQUE: Multidetector CT imaging of the chest was performed using the standard protocol during bolus administration of intravenous contrast. Multiplanar CT image reconstructions and MIPs were obtained to evaluate the vascular anatomy. CONTRAST:  1072mISOVUE-370 IOPAMIDOL (ISOVUE-370) INJECTION 76% COMPARISON:  Chest radiograph performed 02/23/2016, and CT of the chest performed 02/20/2017 FINDINGS: Cardiovascular:  There is no evidence of pulmonary embolus. The heart is borderline enlarged. The thoracic aorta is grossly unremarkable in appearance. The great vessels are within normal limits. Mediastinum/Nodes: A 1.5 cm azygoesophageal recess node is noted. Visualized hilar nodes are borderline normal in size. No pericardial effusion is identified. Incidental note is made of a retroesophageal right subclavian artery. The visualized portions of the thyroid gland are unremarkable. No axillary lymphadenopathy is appreciated. Lungs/Pleura: Patchy bilateral airspace opacities raise concern for pneumonia, most prominent at the lower lobes bilaterally. Scattered peripheral nodular opacities are noted throughout both lungs. This is slightly improved from the prior study. There is a 2.9 cm cavitary focus at the right  lung base, concerning for atypical infection. No pleural effusion or pneumothorax is seen. Upper Abdomen: The visualized portions of the liver and spleen are unremarkable. Musculoskeletal: No acute osseous abnormalities are identified. The visualized musculature is unremarkable in appearance. Review of the MIP images confirms the above findings. IMPRESSION: 1. No evidence of pulmonary embolus. 2. As on the recent prior CT, bilateral pneumonia is again noted. Peripheral nodular opacities are seen throughout both lungs, slightly improved from the prior study. 2.9 cm cavitary focus at the right lung base is better characterized, and is concerning for atypical infection. 3. Enlarged 1.5 cm  azygoesophageal recess node. Electronically Signed   By: Garald Balding M.D.   On: 02/25/2017 04:52   Mr Jeri Cos PI Contrast  Result Date: 02/22/2017 CLINICAL DATA:  Weakness. Endocarditis, infective suspected. Fever and chills. EXAM: MRI HEAD WITHOUT AND WITH CONTRAST TECHNIQUE: Multiplanar, multiecho pulse sequences of the brain and surrounding structures were obtained without and with intravenous contrast. CONTRAST:  19m MULTIHANCE GADOBENATE DIMEGLUMINE 529 MG/ML IV SOLN COMPARISON:  None. FINDINGS: Brain: No acute infarct, hemorrhage, or mass lesion is present. No focal white matter lesions are present. The postcontrast images demonstrate no pathologic enhancement. Is the internal auditory canals are within normal limits bilaterally. The brainstem and cerebellum are normal. No significant extra-axial fluid collection is present. Vascular: Flow is present in the major intracranial arteries. Skull and upper cervical spine: Skull base is within normal limits. The upper cervical spine is normal. The craniocervical junction is normal. Decreased T1 signal is compatible with known anemia. Sinuses/Orbits: The paranasal sinuses and mastoid air cells are clear. Globes and orbits are within normal limits. IMPRESSION: Negative MRI the brain. No acute or focal lesion to explain the patient's weakness. No evidence for infection. Electronically Signed   By: CSan MorelleM.D.   On: 02/22/2017 20:42   Mr Sternum Wo Contrast  Result Date: 02/27/2017 CLINICAL DATA:  Right sternoclavicular septic arthritis. Pain with active motion. Weakness. EXAM: MRI sternum without contrast TECHNIQUE: Multiplanar, multiecho pulse sequences of the sternum without intravenous contrast. CONTRAST:  None COMPARISON:  02/25/2017 CT chest FINDINGS: Patient could not fully tolerate the exam. Coronal T2 fat saturated and postcontrast imaging was not possible. Trace intra-articular and periarticular edema about the right sternoclavicular  joint is noted. Reactive marrow changes are seen about the joint. Findings are compatible with history of septic arthritis. No significant drainable fluid collections are noted. No soft tissue mass is seen. IMPRESSION: Reactive marrow changes about the right sternoclavicular joint with trace intra-articular and periarticular fluid/edema. Findings are in keeping with history of septic arthritis. The contralateral sternoclavicular joint is unremarkable. No marrow signal changes to suggest acute osteomyelitis. Electronically Signed   By: DAshley RoyaltyM.D.   On: 02/27/2017 22:08   Mr Shoulder Right W Wo Contrast  Result Date: 02/27/2017 CLINICAL DATA:  32year old female with right sternoclavicular septic arthritis EXAM: MRI OF THE RIGHT SHOULDER WITHOUT AND WITH CONTRAST TECHNIQUE: Multiplanar, multisequence MR imaging of the right shoulder was performed before and after the administration of intravenous contrast. CONTRAST:  193mMULTIHANCE GADOBENATE DIMEGLUMINE 529 MG/ML IV SOLN COMPARISON:  None. FINDINGS: Rotator cuff: Intact supraspinatus, infraspinatus, teres minor and subscapularis tendons. Muscles: No atrophy or abnormal signal of the muscles of the rotator cuff. Biceps long head:  Intact. Acromioclavicular Joint: Normal acromioclavicular joint. Type II curved acromion. Trace subacromial and subdeltoid bursal fluid and/or edema. Glenohumeral Joint: No joint effusion. No chondral defect. Labrum:  Intact. Bones: Small subcortical degenerative  cystic change along the posterolateral aspect of the humeral head. Marrow edema or fracture. No joint dislocation. Other: None. IMPRESSION: 1. Minimal subacromial bursitis. 2. No rotator cuff tear, marrow signal abnormality, fracture or joint dislocation. 3. Small focus of subcortical degenerate cystic change along the posterior aspect of the humeral head. Electronically Signed   By: Ashley Royalty M.D.   On: 02/27/2017 22:04   Dg Chest Port 1 View  Result Date:  03/19/2017 CLINICAL DATA:  Shortness of breath and leg pain EXAM: PORTABLE CHEST 1 VIEW COMPARISON:  02/22/2017 FINDINGS: Cardiac shadow is within normal limits. Right-sided PICC line is now seen in satisfactory position. The lungs are well aerated bilaterally without focal infiltrate or sizable effusion. No bony abnormality is noted. IMPRESSION: No acute abnormality noted. Electronically Signed   By: Inez Catalina M.D.   On: 03/19/2017 17:42   Dg Chest Port 1 View  Result Date: 02/22/2017 CLINICAL DATA:  Chest pain EXAM: PORTABLE CHEST 1 VIEW COMPARISON:  February 13, 2017 chest radiograph and chest CT February 20, 2017 FINDINGS: There is patchy airspace consolidation in the lung bases consistent with pneumonia. Lungs elsewhere are clear. Note that patchy nodular opacity seen on recent CT in the upper lobes are not appreciable by radiography. Heart is borderline enlarged with pulmonary vascularity within normal limits. No adenopathy appreciable by radiography. No bone lesions. IMPRESSION: Bibasilar airspace consolidation consistent with pneumonia. Nodular opacity seen in the upper lobe regions on recent CT are not appreciable by radiography. There is mild cardiomegaly. No adenopathy is appreciable by radiography. Electronically Signed   By: Lowella Grip III M.D.   On: 02/22/2017 21:49   [place] Ekg Site Rite  Result Date: 03/21/2017 If Site Rite image not attached, placement could not be confirmed due to current cardiac rhythm.   Labs:  CBC: Recent Labs    03/21/17 0411 03/21/17 2324 03/22/17 0224 03/23/17 0418 03/23/17 0737  WBC 0.6*  --  0.7* 0.6* 0.5*  HGB 6.4* 8.0* 7.4* 7.2* 7.4*  HCT 19.3* 23.0* 21.8* 20.5* 21.0*  PLT 158  --  146*  147* 160 171    COAGS: Recent Labs    02/13/17 1604 02/13/17 1701 03/22/17 0224  INR 1.08  --  1.40  APTT  --  49* 49*    BMP: Recent Labs    03/21/17 0411 03/22/17 0224 03/23/17 0418 03/24/17 0449  NA 135 132* 132* 133*  K 3.2* 3.5 3.2* 3.2*   CL 109 107 103 103  CO2 17* 16* 19* 20*  GLUCOSE 78 107* 97 87  BUN '11 10 9 7  ' CALCIUM 8.0* 8.4* 8.8* 8.9  CREATININE 0.82 0.82 0.78 0.75  GFRNONAA >60 >60 >60 >60  GFRAA >60 >60 >60 >60    LIVER FUNCTION TESTS: Recent Labs    02/15/17 0247 02/16/17 0252 02/18/17 0503 03/12/17 1511 03/22/17 0224  BILITOT 2.0* 2.1* 1.4*  --  2.0*  AST 47* 49* 50*  --  11*  ALT '27 22 22  ' --  9*  ALKPHOS 153* 186* 173*  --  69  PROT 5.3* 5.1* 6.1*  --  5.8*  ALBUMIN 1.6* 1.5* 1.6* 2.3* 1.8*    TUMOR MARKERS: No results for input(s): AFPTM, CEA, CA199, CHROMGRNA in the last 8760 hours.  Assessment and Plan:  Pancytopenia Neutropenia Scheduled for Bone marrow bx when consent from mother is available (LM to mother via phone)  Thank you for this interesting consult.  I greatly enjoyed meeting [name] and look  forward to participating in their care.  A copy of this report was sent to the requesting provider on this date.  Electronically Signed: Lavonia Drafts, PA-C 03/24/2017, 7:03 AM   I spent a total of 40 Minutes    in face to face in clinical consultation, greater than 50% of which was counseling/coordinating care for Bone marrow bx

## 2017-03-24 NOTE — Progress Notes (Addendum)
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  Patient was not felt to be a candidate for valve replacement at this time due to her ongoing drug use.  She was also noted to have right sternoclavicular septic arthritis.  She was initially maintained on IV nafcillin to complete a total of 6-8 weeks of IV antibiotics as she was not safe to discharge home with PICC line and no facility was willing to accept the patient.  Her course has been complicated by an episode on 03/19/2017 of sepsis physiology with worsening pancytopenia and neutropenia for which hematology was consulted and is following as well as worsening cellulitis and myositis of the left neck and shoulder area.  With her progressive anemia, prior history of GI bleed, and reports of small amounts of blood in her stool and dark stools GI was consulted but felt that patient was not having lower GI bleed.  Assessment & Plan:   Principal Problem:   MSSA bacteremia Active Problems:   Sepsis affecting skin ([place])   Cellulitis of left hand   Staphylococcus aureus bacteremia with sepsis (HCC)   Weakness of both lower extremities   Pain of right clavicle   IVDU (intravenous drug user)   Septic arthritis of right sternoclavicular joint (HCC)   PFO (patent foramen ovale)   Tricuspid valve vegetation   Chronic diastolic CHF (congestive heart failure) (HCC)   Blood per rectum   Hematuria   #) MSSA tricuspid valve endocarditis: While initially the patient was improving she now has complications of her metastatic disease including right sternoclavicular septic arthritis and left  sternocleidomastoid myositis and spreading infection around the neck area.  Her septic arthritis on the right has improved and her myositis on the left is diminishing in swelling and pain. -repeat echo showed tricuspid valve vegetation and damage to tricuspid valve which was not seen in prior TTE February -PICC line was replaced on 03/21/2017 due to difficult access -We will consider CT of the chest to evaluate for neck infection extending into her mediastinum -Repeat blood cultures pending -ID following appreciate recommendations - Continue cefazolin started 03/22/2017 per ID (prior to that IV nafcillin was from 2/8 -  03/19/17 and cefepime was from 3/6 - 03/22/2017)  #) Profound neutropenia and lymphopenia: Per hematology possibly related to a reaction to her uncontrolled infection or from antibiotic side effects.  Nafcillin was discontinued and cefepime was started due to concern about possible nafcillin related bone marrow suppression.  At this time however patient continues to have profound neutropenia of unclear etiology along with mild thrombocytopenia and anemia.  Prior imaging of the CT of the chest does show an enlarged spleen as well. -hematology following appreciate recommendations -No need for Neupogen at this time though they will reconsider pending patient's clinical course -Pending lymphocyte enumeration panel -CT-guided bone marrow biopsy ordered for 03/24/2017, n.p.o. at midnight  #) Anemia: Multifactorial including iron deficiency, bone marrow suppression from sepsis/endocarditis and possibly nafcillin. She is status post transfusion of 2 units packed red blood cells on 03/21/2017.  She did have some blood in her stools however GI did not feel that this was in the setting  of an active GI bleed.  Per above pending CT-guided bone marrow biopsy -DIC panel shows no evidence of schistocytes and fibrinogen is quite elevated -Continue pantoprazole IV 40 mg twice daily -Per GI they did not recommend  any endoscopy or colonoscopy unless more overt bleeding was present.  They also recommended cross-sectional imaging of the abdomen and pelvis if she began bleeding again.  #) Psych/pain: -Continue Suboxone -Continue cyclobenzaprine 10 mg 3 times daily as needed -Continue duloxetine 60 mg twice daily  -continue alprazolam 0.5 mg 4 times daily as needed Continue oxycodone 5 mg every 4 hours as needed Continue olanzapine 5 mg every morning  Fluids: Tolerating p.o. Electrodes: Monitor and supplement Nutrition: Regular diet, n.p.o. at midnight for CT-guided biopsy of bone marrow  Prophylaxis: We will place SCDs as patient has been bedbound in the setting of feeling quite ill with recurrent metastatic disease  Disposition: Pending completion of IV antibiotics and resolution of multiple metastatic infectious complications  Full code  Consultants:   Infectious disease  Heme  Cardiology  CT Surgery  Gastroenterology  Procedures: Echo 03/21/2017: - Left ventricle: The cavity size was normal. Wall thickness was   increased in a pattern of mild LVH. Systolic function was normal.   The estimated ejection fraction was in the range of 50% to 55%.   Wall motion was normal; there were no regional wall motion   abnormalities. - Tricuspid valve: There is a small semi mobile density attached to   the atrial side of the anterior tricuspid valve leaflet. There   looks to be at least partial prolapse of a portion of the   anterior leaflet, with associated regurgitaton. The TR is   eccentric and posterior, may be underestimated. There was   moderate-severe regurgitation. - Limited echo to evaluate TV vegetation.  TEE 02/20/2017:- Tricuspid valve endocarditis with moderate to severe TR,   ?perforated leaflet. There was a large PFO, putting the patient   at risk for spread of infection from venous circulation to   arterial circulation and putting the mitral and aortic valves at higher  risk.  Echo 02/14/2017: - LVEF 60-65%, normal wall thickness, normal wall motion, grade 1   DD, normal LV filling pressure, normal LA size, moderate RAE mild TR, RVSP 46 mmHg, small IVC suggestive of volume depletion.  PICC placed 03/21/2017  Antimicrobials: IV Ancef 2/-2/8 Zyvox 2/5-2/6, 2/17-/18 IV nafcillin 2/8-3/6 IV cefepime 3/6-3/9 IV cefazolin 3/9 to ongoing    Subjective: Patient reports that her left-sided neck swelling and pain has improved.  She reports just some nasal congestion this morning.  Objective: Vitals:   03/24/17 1215 03/24/17 1230 03/24/17 1300 03/24/17 1330  BP: 125/76 (!) 114/52 119/80   Pulse: (!) 126 (!) 116 (!) 113   Resp: (!) 24 (!) 22 (!) 21   Temp: 99.2 F (37.3 C) 99.5 F (37.5 C) 99.1 F (37.3 C) 99.5 F (37.5 C)  TempSrc: Axillary Axillary Axillary Axillary  SpO2: 100% 99% 93%   Weight:      Height:        Intake/Output Summary (Last 24 hours) at 03/24/2017 1550 Last data filed at 03/24/2017 1317 Gross per 24 hour  Intake 250 ml  Output -  Net 250 ml   Filed Weights   03/23/17 0500 03/24/17 0422 03/24/17 0500  Weight: 79 kg (174 lb 2.6 oz) 80.5 kg (177 lb 7.5 oz) 78.9 kg (173 lb 15.1 oz)    Examination:  General exam: No acute distress, sleeping in  bed Respiratory system: Clear to auscultation. Respiratory effort normal rhonchi. Cardiovascular system: Tachycardic, 2 out of 6 systolic murmur heard across precordium, bounding pulses Gastrointestinal system: Abdomen is nondistended, soft and nontender. No organomegaly or masses felt. Normal bowel sounds heard. Central nervous system: Alert and oriented. No focal neurological deficits. Extremities: Trace lower extremity edema Skin: Over left neck and submandibular region noted to have large area of erythema induration and edema that is mildly tender to palpation, area has diminished in pain and edema since yesterday Psychiatry: Judgement and insight appear normal. Mood & affect  appropriate.     Data Reviewed: I have personally reviewed following labs and imaging studies  CBC: Recent Labs  Lab 03/19/17 1048 03/20/17 0428 03/21/17 0411 03/21/17 2324 03/22/17 0224 03/23/17 0418 03/23/17 0737 03/24/17 1157  WBC  --  0.4* 0.6*  --  0.7* 0.6* 0.5* 0.9*  NEUTROABS 0.0* 0.0* 0.0*  --  0.0*  --  0.0*  --   HGB  --  7.8* 6.4* 8.0* 7.4* 7.2* 7.4* 7.8*  HCT  --  23.9* 19.3* 23.0* 21.8* 20.5* 21.0* 23.0*  MCV  --  90.9 87.7  --  86.5 86.1 86.1 85.2  PLT  --  251 158  --  146*  147* 160 171 388   Basic Metabolic Panel: Recent Labs  Lab 03/20/17 0428 03/21/17 0411 03/22/17 0224 03/23/17 0418 03/24/17 0449  NA 133* 135 132* 132* 133*  K 3.5 3.2* 3.5 3.2* 3.2*  CL 106 109 107 103 103  CO2 17* 17* 16* 19* 20*  GLUCOSE 104* 78 107* 97 87  BUN '16 11 10 9 7  ' CREATININE 0.94 0.82 0.82 0.78 0.75  CALCIUM 7.8* 8.0* 8.4* 8.8* 8.9  MG  --   --  1.6*  --   --    GFR: Estimated Creatinine Clearance: 109.2 mL/min (by C-G formula based on SCr of 0.75 mg/dL). Liver Function Tests: Recent Labs  Lab 03/22/17 0224  AST 11*  ALT 9*  ALKPHOS 69  BILITOT 2.0*  PROT 5.8*  ALBUMIN 1.8*   No results for input(s): LIPASE, AMYLASE in the last 168 hours. No results for input(s): AMMONIA in the last 168 hours. Coagulation Profile: Recent Labs  Lab 03/22/17 0224  INR 1.40   Cardiac Enzymes: No results for input(s): CKTOTAL, CKMB, CKMBINDEX, TROPONINI in the last 168 hours. BNP (last 3 results) No results for input(s): PROBNP in the last 8760 hours. HbA1C: No results for input(s): HGBA1C in the last 72 hours. CBG: No results for input(s): GLUCAP in the last 168 hours. Lipid Profile: No results for input(s): CHOL, HDL, LDLCALC, TRIG, CHOLHDL, LDLDIRECT in the last 72 hours. Thyroid Function Tests: No results for input(s): TSH, T4TOTAL, FREET4, T3FREE, THYROIDAB in the last 72 hours. Anemia Panel: No results for input(s): VITAMINB12, FOLATE, FERRITIN, TIBC,  IRON, RETICCTPCT in the last 72 hours. Sepsis Labs: Recent Labs  Lab 03/19/17 1536 03/19/17 1541  PROCALCITON 2.49  --   LATICACIDVEN  --  1.0    Recent Results (from the past 240 hour(s))  Culture, blood (Routine X 2) w Reflex to ID Panel     Status: None   Collection Time: 03/19/17 10:40 AM  Result Value Ref Range Status   Specimen Description BLOOD LEFT HAND  Final   Special Requests IN PEDIATRIC BOTTLE Blood Culture adequate volume  Final   Culture   Final    NO GROWTH 5 DAYS Performed at [facility], [place] [address],  [place], [place] [address]    Report Status 03/24/2017 FINAL  Final  Culture, blood (Routine X 2) w Reflex to ID Panel     Status: None   Collection Time: 03/19/17 10:50 AM  Result Value Ref Range Status   Specimen Description BLOOD LEFT ARM  Final   Special Requests IN PEDIATRIC BOTTLE Blood Culture adequate volume  Final   Culture   Final    NO GROWTH 5 DAYS Performed at [facility], [place] [address], [place], [place] [address]    Report Status 03/24/2017 FINAL  Final  Rapid Strep Screen (Not at [facility])     Status: None   Collection Time: 03/20/17  8:20 AM  Result Value Ref Range Status   Streptococcus, Group A Screen (Direct) NEGATIVE NEGATIVE Final    Comment: (NOTE) A Rapid Antigen test may result negative if the antigen level in the sample is below the detection level of this test. The FDA has not cleared this test as a stand-alone test therefore the rapid antigen negative result has reflexed to a Group A Strep culture.   Culture, group A strep     Status: None   Collection Time: 03/20/17  8:20 AM  Result Value Ref Range Status   Specimen Description THROAT  Final   Special Requests NONE Reflexed from [id number]  Final   Culture   Final    NO GROUP A STREP (S.PYOGENES) ISOLATED Performed at [facility], [address] [place], [place] [address]    Report Status 03/22/2017 FINAL  Final  Culture, blood (routine x 2)     Status:  None (Preliminary result)   Collection Time: 03/21/17 11:15 PM  Result Value Ref Range Status   Specimen Description BLOOD RIGHT HAND  Final   Special Requests IN PEDIATRIC BOTTLE Blood Culture adequate volume  Final   Culture   Final    NO GROWTH 2 DAYS Performed at [facility], [place] [address] [place], [place] [address]    Report Status PENDING  Incomplete  Culture, blood (routine x 2)     Status: None (Preliminary result)   Collection Time: 03/21/17 11:25 PM  Result Value Ref Range Status   Specimen Description BLOOD RIGHT HAND  Final   Special Requests IN PEDIATRIC BOTTLE Blood Culture adequate volume  Final   Culture   Final    NO GROWTH 2 DAYS Performed at [facility], [place] [address], [place], [place] [address]    Report Status PENDING  Incomplete         Radiology Studies: Ct Bone Marrow Biopsy & Aspiration  Result Date: 03/24/2017 CLINICAL DATA:  Pancytopenia and neutropenia. MSSA bacteremia. History of IV drug abuse, bacterial endocarditis and right sternoclavicular septic arthritis. EXAM: CT GUIDED BONE MARROW ASPIRATION AND BIOPSY ANESTHESIA/SEDATION: Versed 4.0 mg IV, Fentanyl 150 mcg IV Total Moderate Sedation Time:   25 minutes. The patient's level of consciousness and physiologic status were continuously monitored during the procedure by Radiology nursing. PROCEDURE: The procedure risks, benefits, and alternatives were explained to the patient's mother. Questions regarding the procedure were encouraged and answered. The patient's mother understands and consents to the procedure. A time out was performed prior to initiating the procedure. The right gluteal region was prepped with chlorhexidine. Sterile gown and sterile gloves were used for the procedure. Local anesthesia was provided with 1% Lidocaine. Under CT guidance, an 11 gauge On Control bone cutting needle was advanced from a posterior approach into  the right iliac bone. Needle positioning was confirmed  with CT. Initial non heparinized and heparinized aspirate samples were obtained of bone marrow. Core biopsy was performed via the On Control drill needle. Two separate core biopsy samples were obtained. COMPLICATIONS: None FINDINGS: Inspection of initial aspirate did reveal visible particles. Intact core biopsy samples were obtained. A second core biopsy sample was obtained and placed in a sterile container in the event that culture analysis is necessary. IMPRESSION: CT guided bone marrow biopsy of right posterior iliac bone with both aspirate and core samples obtained. Electronically Signed   By: Aletta Edouard M.D.   On: 03/24/2017 11:59        Scheduled Meds: . buprenorphine-naloxone  2 tablet Sublingual Daily  . DULoxetine  60 mg Oral BID  . fentaNYL      . ferrous sulfate  325 mg Oral TID WC  . gadobenate dimeglumine  15 mL Intravenous Once  . lidocaine      . midazolam      . multivitamin with minerals  1 tablet Oral Daily  . nicotine  7 mg Transdermal Q24H  . OLANZapine  5 mg Oral q morning - 10a  . pantoprazole (PROTONIX) IV  40 mg Intravenous Q12H  . polyethylene glycol  17 g Oral Daily  . senna-docusate  1 tablet Oral BID   Continuous Infusions: . sodium chloride    .  ceFAZolin (ANCEF) IV Stopped (03/24/17 1347)     LOS: 39 days    Time spent: [place], MD [facility]  If 7PM-7AM, please contact night-coverage [email or web address] Password [email or web address] 03/24/2017, 3:50 PM

## 2017-03-24 NOTE — Progress Notes (Signed)
[facility]   Reason for visit: Follow up on Staph aureus bacteremia, spetic arthritis, endocarditis; cellulitis  Interval History: fever curve decreasing, WBC up to 0.9.  BM biopsy done.  Mother at bedside.  No new associated rash.   Physical Exam: Constitutional:  Vitals:   03/24/17 1215 03/24/17 1230  BP: 125/76 (!) 114/52  Pulse: (!) 126 (!) 116  Resp: (!) 24 (!) 22  Temp: 99.2 F (37.3 C) 99.5 F (37.5 C)  SpO2: 100% 99%   patient appears in NAD HENT: improved erythema of left neck Respiratory: Normal respiratory effort; CTA B Cardiovascular: RRR GI: soft, nt, nd  Review of Systems: Constitutional: negative for anorexia Gastrointestinal: negative for diarrhea  Lab Results  Component Value Date   WBC 0.9 (LL) 03/24/2017   HGB 7.8 (L) 03/24/2017   HCT 23.0 (L) 03/24/2017   MCV 85.2 03/24/2017   PLT 212 03/24/2017    Lab Results  Component Value Date   CREATININE 0.75 03/24/2017   BUN 7 03/24/2017   NA 133 (L) 03/24/2017   K 3.2 (L) 03/24/2017   CL 103 03/24/2017   CO2 20 (L) 03/24/2017    Lab Results  Component Value Date   ALT 9 (L) 03/22/2017   AST 11 (L) 03/22/2017   ALKPHOS 69 03/22/2017     Microbiology: Recent Results (from the past 240 hour(s))  Culture, blood (Routine X 2) w Reflex to ID Panel     Status: None (Preliminary result)   Collection Time: 03/19/17 10:40 AM  Result Value Ref Range Status   Specimen Description BLOOD LEFT HAND  Final   Special Requests IN PEDIATRIC BOTTLE Blood Culture adequate volume  Final   Culture   Final    NO GROWTH 4 DAYS Performed at [facility], [address] [place], [place] [address]    Report Status PENDING  Incomplete  Culture, blood (Routine X 2) w Reflex to ID Panel     Status: None (Preliminary result)   Collection Time: 03/19/17 10:50 AM  Result Value Ref Range Status   Specimen Description BLOOD LEFT ARM  Final   Special Requests IN PEDIATRIC BOTTLE Blood  Culture adequate volume  Final   Culture   Final    NO GROWTH 4 DAYS Performed at [facility], [address] [place], [place] [address]    Report Status PENDING  Incomplete  Rapid Strep Screen (Not at [facility])     Status: None   Collection Time: 03/20/17  8:20 AM  Result Value Ref Range Status   Streptococcus, Group A Screen (Direct) NEGATIVE NEGATIVE Final    Comment: (NOTE) A Rapid Antigen test may result negative if the antigen level in the sample is below the detection level of this test. The FDA has not cleared this test as a stand-alone test therefore the rapid antigen negative result has reflexed to a Group A Strep culture.   Culture, group A strep     Status: None   Collection Time: 03/20/17  8:20 AM  Result Value Ref Range Status   Specimen Description THROAT  Final   Special Requests NONE Reflexed from [id number]  Final   Culture   Final    NO GROUP A STREP (S.PYOGENES) ISOLATED Performed at [facility], [address] [place], [place] [address]    Report Status 03/22/2017 FINAL  Final  Culture, blood (routine x 2)     Status: None (Preliminary result)  Collection Time: 03/21/17 11:15 PM  Result Value Ref Range Status   Specimen Description BLOOD RIGHT HAND  Final   Special Requests IN PEDIATRIC BOTTLE Blood Culture adequate volume  Final   Culture   Final    NO GROWTH 1 DAY Performed at [facility], [address], [place], [place] [address]    Report Status PENDING  Incomplete  Culture, blood (routine x 2)     Status: None (Preliminary result)   Collection Time: 03/21/17 11:25 PM  Result Value Ref Range Status   Specimen Description BLOOD RIGHT HAND  Final   Special Requests IN PEDIATRIC BOTTLE Blood Culture adequate volume  Final   Culture   Final    NO GROWTH 1 DAY Performed at [facility], [address], [place], [place] [address]    Report Status PENDING  Incomplete    Impression/Plan:  1. Fever, cellulitis - no new positive  cultures.  Continue with cefazolin  2.  Leukopenia - stable, still very low but improving.  I would suspect a week or more for recovery if due to medication.  Continue to monitor.  3.  Endocarditis - on prolonged treatment through April 2nd.     4. Septic arthritis - continue with cefazolin as above, through April 2nd.

## 2017-03-25 DIAGNOSIS — M60009 Infective myositis, unspecified site: Secondary | ICD-10-CM

## 2017-03-25 DIAGNOSIS — L0211 Cutaneous abscess of neck: Secondary | ICD-10-CM | POA: Diagnosis not present

## 2017-03-25 LAB — CBC
HCT: 23.7 % — ABNORMAL LOW (ref 36.0–46.0)
Hemoglobin: 8.2 g/dL — ABNORMAL LOW (ref 12.0–15.0)
MCH: 29.6 pg (ref 26.0–34.0)
MCHC: 34.6 g/dL (ref 30.0–36.0)
MCV: 85.6 fL (ref 78.0–100.0)
PLATELETS: 249 10*3/uL (ref 150–400)
RBC: 2.77 MIL/uL — ABNORMAL LOW (ref 3.87–5.11)
RDW: 18.1 % — ABNORMAL HIGH (ref 11.5–15.5)
WBC: 1.9 10*3/uL — ABNORMAL LOW (ref 4.0–10.5)

## 2017-03-25 MED ORDER — ACETAMINOPHEN 650 MG RE SUPP: 650.0000 mg | Freq: Four times a day (QID) | RECTAL | Status: DC | PRN

## 2017-03-25 MED ORDER — PANTOPRAZOLE SODIUM 40 MG PO TBEC: 40.0000 mg | DELAYED_RELEASE_TABLET | Freq: Two times a day (BID) | ORAL | Status: DC

## 2017-03-25 MED ORDER — ALTEPLASE 2 MG IJ SOLR
2.0000 mg | Freq: Once | INTRAMUSCULAR | Status: AC
  Administered 2017-03-25: 2 mg
  Filled 2017-03-25 (×2): qty 2

## 2017-03-25 MED ORDER — SODIUM CHLORIDE 0.9 % IV BOLUS (SEPSIS)
2000.0000 mL | Freq: Once | INTRAVENOUS | Status: AC
  Administered 2017-03-25: 2000 mL via INTRAVENOUS

## 2017-03-25 MED ORDER — OXYCODONE HCL 5 MG PO TABS: 2.5000 mg | ORAL_TABLET | Freq: Four times a day (QID) | ORAL | Status: DC | PRN

## 2017-03-25 MED ORDER — DEXTROSE-NACL 5-0.45 % IV SOLN
INTRAVENOUS | Status: DC
  Administered 2017-03-25 – 2017-03-28 (×4): via INTRAVENOUS

## 2017-03-25 MED ORDER — NALOXONE HCL 0.4 MG/ML IJ SOLN: 0.4000 mg | INTRAMUSCULAR | Status: DC | PRN

## 2017-03-25 MED ORDER — OXYCODONE HCL 5 MG PO TABS
2.5000 mg | ORAL_TABLET | Freq: Four times a day (QID) | ORAL | Status: DC | PRN
  Administered 2017-03-26 – 2017-04-14 (×50): 2.5 mg via ORAL
  Filled 2017-03-25 (×51): qty 1

## 2017-03-25 MED ORDER — DEXAMETHASONE SODIUM PHOSPHATE 10 MG/ML IJ SOLN
10.0000 mg | Freq: Three times a day (TID) | INTRAMUSCULAR | Status: AC
  Administered 2017-03-25 – 2017-03-26 (×3): 10 mg via INTRAVENOUS
  Filled 2017-03-25 (×3): qty 1

## 2017-03-25 MED ORDER — IOPAMIDOL (ISOVUE-300) INJECTION 61%
INTRAVENOUS | Status: AC
  Administered 2017-03-26: 75 mL
  Filled 2017-03-25: qty 75

## 2017-03-25 MED ORDER — PANTOPRAZOLE SODIUM 40 MG IV SOLR
40.0000 mg | Freq: Two times a day (BID) | INTRAVENOUS | Status: DC
  Administered 2017-03-25 – 2017-03-28 (×6): 40 mg via INTRAVENOUS
  Filled 2017-03-25 (×6): qty 40

## 2017-03-25 NOTE — Progress Notes (Addendum)
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  Patient was not felt to be a candidate for valve replacement at this time due to her ongoing drug use.  She was also noted to have right sternoclavicular septic arthritis.  She was initially maintained on IV nafcillin to complete a total of 6-8 weeks of IV antibiotics as she was not safe to discharge home with PICC line and no facility was willing to accept the patient.  Her course has been complicated by an episode on 03/19/2017 of sepsis physiology with worsening pancytopenia and neutropenia for which hematology was consulted and is following as well as worsening cellulitis and myositis of the left neck and shoulder area.  With her progressive anemia, prior history of GI bleed, and reports of small amounts of blood in her stool and dark stools GI was consulted but felt that patient was not having lower GI bleed.  Assessment & Plan:   Principal Problem:   MSSA bacteremia Active Problems:   Sepsis affecting skin ([place])   Cellulitis of left hand   Staphylococcus aureus bacteremia with sepsis (HCC)   Weakness of both lower extremities   Pain of right clavicle   IVDU (intravenous drug user)   Septic arthritis of right sternoclavicular joint (HCC)   PFO (patent foramen ovale)   Tricuspid valve vegetation   Chronic diastolic CHF (congestive heart failure) (HCC)   Blood per rectum   Hematuria   Infectious myositis L neck   #) MSSA tricuspid valve endocarditis: admitted 1/31 with bacteremia and endocarditis. Fevers resolved over first 4-5 days here. MRI on 2/14 showed R  sternoclavicular septic arthritis.  Afebrile until 3/6 developed high spiking temps along with pain and erythema of upper mid chest and left neck. CT showed myositis of L sternocleidomastoid w/ local cellulitis/ inflammation, but no abscess noted. New fevers happened around same time as severe leukopenia.  Repeat blood cultures were negative from 3/6 and 3/8.  Nafcillin was suspected as cause of BM suppression and abx were changed to Cefepime x 3 days then to Ancef. Now the WBC is improving up to 1.9 today.  Pt still spiking temps 101- 102.  Pt now with swallowing/ secretion issues x 1-2 days, getting worse.  Concerned about infection spread, will consult ENT and make NPO for now, start IVF's. Swallow eval. Have d/w pt's mother at bedside. Have d/w ID.  Also will decrease po meds for pain as patient may be oversedated.  - npo, swallow eval - IVF"s -  ENT consult -- hold nonessential meds for now  #) Profound neutropenia and lymphopenia: improving now.  WBC 1.9.  Antibiotics were changed due to suspected BM suppression.  BM biopsy done 3/11.  Hematology following.  -No need for Neupogen at this time though they will reconsider pending patient's clinical course -Pending lymphocyte enumeration panel   #) Anemia: Multifactorial including iron deficiency, bone marrow suppression from sepsis/endocarditis and possibly nafcillin. SP 2 units packed red blood cells on 03/21/2017.  She did have some blood in her stools however GI did not feel that this was in the setting of an active GI bleed -DIC panel shows no  evidence of schistocytes and fibrinogen is quite elevated -Continue pantoprazole IV 40 mg twice daily -Per GI they did not recommend any endoscopy or colonoscopy unless more overt bleeding was present.  They also recommended cross-sectional imaging of the abdomen and pelvis if she began bleeding again.  #) Psych/pain: - continue suboxone - continue duloxetine 60 mg twice daily  -- reducing pain meds to  oxy IR 2.5 every 6 hrs and hold next dose until 6 pm -- continue olanzapine 5 mg every morning  Fluids: Tolerating p.o. Electrodes: Monitor and supplement Nutrition: Regular diet, n.p.o. at midnight for CT-guided biopsy of bone marrow  Prophylaxis: no lovenox for now w/ BM suppression. Cont SCDs as patient has been bedbound w recurrence of fevers, etc  Disposition: Pending completion of IV antibiotics and resolution of multiple metastatic infectious complications  Full code  Consultants:   Infectious disease  Heme  Cardiology  CT Surgery  Gastroenterology  Procedures: Echo 03/21/2017: - Left ventricle: The cavity size was normal. Wall thickness was   increased in a pattern of mild LVH. Systolic function was normal.   The estimated ejection fraction was in the range of 50% to 55%.   Wall motion was normal; there were no regional wall motion   abnormalities. - Tricuspid valve: There is a small semi mobile density attached to   the atrial side of the anterior tricuspid valve leaflet. There   looks to be at least partial prolapse of a portion of the   anterior leaflet, with associated regurgitaton. The TR is   eccentric and posterior, may be underestimated. There was   moderate-severe regurgitation. - Limited echo to evaluate TV vegetation.  TEE 02/20/2017:- Tricuspid valve endocarditis with moderate to severe TR,   ?perforated leaflet. There was a large PFO, putting the patient   at risk for spread of infection from venous circulation to   arterial circulation and putting the mitral and aortic valves at higher risk.  Echo 02/14/2017: - LVEF 60-65%, normal wall thickness, normal wall motion, grade 1   DD, normal LV filling pressure, normal LA size, moderate RAE mild TR, RVSP 46 mmHg, small IVC suggestive of volume depletion.  PICC placed 03/21/2017  Antimicrobials: IV Ancef 2/-2/8 Zyvox 2/5-2/6, 2/17-/18 IV nafcillin 2/8-3/6 IV cefepime 3/6-3/9 IV cefazolin 3/9 to  ongoing    Subjective: Patient reports that her left-sided neck swelling and pain has improved.  She reports just some nasal congestion this morning.  Objective: Vitals:   03/25/17 0250 03/25/17 0350 03/25/17 0454 03/25/17 0638  BP: 108/84 108/84 119/81   Pulse: (!) 128  (!) 123   Resp: (!) 23  (!) 23   Temp:  (!) 97.5 F (36.4 C)    TempSrc:  Oral    SpO2: 94%  94%   Weight:    76.1 kg (167 lb 12.3 oz)  Height:        Intake/Output Summary (Last 24 hours) at 03/25/2017 1005 Last data filed at 03/24/2017 2227 Gross per 24 hour  Intake 500 ml  Output 700 ml  Net -200 ml   Filed Weights   03/24/17 0422 03/24/17 0500 03/25/17 0638  Weight: 80.5 kg (177 lb 7.5 oz) 78.9 kg (173 lb 15.1 oz) 76.1 kg (167 lb 12.3 oz)    Examination:  General exam: No acute distress, sleeping in bed Respiratory system: Clear to auscultation. Respiratory effort normal rhonchi. Cardiovascular system: Tachycardic, 2 out of 6 systolic murmur heard across precordium, bounding pulses Gastrointestinal system: Abdomen is nondistended,  soft and nontender. No organomegaly or masses felt. Normal bowel sounds heard. Central nervous system: Alert and oriented. No focal neurological deficits. Extremities: Trace lower extremity edema Skin: Over left neck and submandibular region noted to have large area of erythema induration and edema that is mildly tender to palpation, area has diminished in pain and edema since yesterday Psychiatry: Judgement and insight appear normal. Mood & affect appropriate.     Data Reviewed: I have personally reviewed following labs and imaging studies  CBC: Recent Labs  Lab 03/19/17 1048 03/20/17 0428 03/21/17 0411  03/22/17 0224 03/23/17 0418 03/23/17 0737 03/24/17 1157 03/25/17 0309  WBC  --  0.4* 0.6*  --  0.7* 0.6* 0.5* 0.9* 1.9*  NEUTROABS 0.0* 0.0* 0.0*  --  0.0*  --  0.0*  --   --   HGB  --  7.8* 6.4*   < > 7.4* 7.2* 7.4* 7.8* 8.2*  HCT  --  23.9* 19.3*   < >  21.8* 20.5* 21.0* 23.0* 23.7*  MCV  --  90.9 87.7  --  86.5 86.1 86.1 85.2 85.6  PLT  --  251 158  --  146*  147* 160 171 212 249   < > = values in this interval not displayed.   Basic Metabolic Panel: Recent Labs  Lab 03/20/17 0428 03/21/17 0411 03/22/17 0224 03/23/17 0418 03/24/17 0449  NA 133* 135 132* 132* 133*  K 3.5 3.2* 3.5 3.2* 3.2*  CL 106 109 107 103 103  CO2 17* 17* 16* 19* 20*  GLUCOSE 104* 78 107* 97 87  BUN _0 CREATININE 0.94 0.82 0.82 0.78 0.75  CALCIUM 7.8* 8.0* 8.4* 8.8* 8.9  MG  --   --  1.6*  --   --    GFR: Estimated Creatinine Clearance: 107.4 mL/min (by C-G formula based on SCr of 0.75 mg/dL). Liver Function Tests: Recent Labs  Lab 03/22/17 0224  AST 11*  ALT 9*  ALKPHOS 69  BILITOT 2.0*  PROT 5.8*  ALBUMIN 1.8*   No results for input(s): LIPASE, AMYLASE in the last 168 hours. No results for input(s): AMMONIA in the last 168 hours. Coagulation Profile: Recent Labs  Lab 03/22/17 0224  INR 1.40   Cardiac Enzymes: No results for input(s): CKTOTAL, CKMB, CKMBINDEX, TROPONINI in the last 168 hours. BNP (last 3 results) No results for input(s): PROBNP in the last 8760 hours. HbA1C: No results for input(s): HGBA1C in the last 72 hours. CBG: No results for input(s): GLUCAP in the last 168 hours. Lipid Profile: No results for input(s): CHOL, HDL, LDLCALC, TRIG, CHOLHDL, LDLDIRECT in the last 72 hours. Thyroid Function Tests: No results for input(s): TSH, T4TOTAL, FREET4, T3FREE, THYROIDAB in the last 72 hours. Anemia Panel: No results for input(s): VITAMINB12, FOLATE, FERRITIN, TIBC, IRON, RETICCTPCT in the last 72 hours. Sepsis Labs: Recent Labs  Lab 03/19/17 1536 03/19/17 1541  PROCALCITON 2.49  --   LATICACIDVEN  --  1.0    Recent Results (from the past 240 hour(s))  Culture, blood (Routine X 2) w Reflex to ID Panel     Status: None   Collection Time: 03/19/17 10:40 AM  Result Value Ref Range Status   Specimen  Description BLOOD LEFT HAND  Final   Special Requests IN PEDIATRIC BOTTLE Blood Culture adequate volume  Final   Culture   Final    NO GROWTH 5 DAYS Performed at [facility], [place] [address], [place], [place] [address]  Report Status 03/24/2017 FINAL  Final  Culture, blood (Routine X 2) w Reflex to ID Panel     Status: None   Collection Time: 03/19/17 10:50 AM  Result Value Ref Range Status   Specimen Description BLOOD LEFT ARM  Final   Special Requests IN PEDIATRIC BOTTLE Blood Culture adequate volume  Final   Culture   Final    NO GROWTH 5 DAYS Performed at [facility], [place] [address], [place], [place] [address]    Report Status 03/24/2017 FINAL  Final  Rapid Strep Screen (Not at [facility])     Status: None   Collection Time: 03/20/17  8:20 AM  Result Value Ref Range Status   Streptococcus, Group A Screen (Direct) NEGATIVE NEGATIVE Final    Comment: (NOTE) A Rapid Antigen test may result negative if the antigen level in the sample is below the detection level of this test. The FDA has not cleared this test as a stand-alone test therefore the rapid antigen negative result has reflexed to a Group A Strep culture.   Culture, group A strep     Status: None   Collection Time: 03/20/17  8:20 AM  Result Value Ref Range Status   Specimen Description THROAT  Final   Special Requests NONE Reflexed from [id number]  Final   Culture   Final    NO GROUP A STREP (S.PYOGENES) ISOLATED Performed at [facility], [address], [place], [place] [address]    Report Status 03/22/2017 FINAL  Final  Culture, blood (routine x 2)     Status: None (Preliminary result)   Collection Time: 03/21/17 11:15 PM  Result Value Ref Range Status   Specimen Description BLOOD RIGHT HAND  Final   Special Requests IN PEDIATRIC BOTTLE Blood Culture adequate volume  Final   Culture   Final    NO GROWTH 2 DAYS Performed at [facility], [place] [address] [place], [place] [address]    Report  Status PENDING  Incomplete  Culture, blood (routine x 2)     Status: None (Preliminary result)   Collection Time: 03/21/17 11:25 PM  Result Value Ref Range Status   Specimen Description BLOOD RIGHT HAND  Final   Special Requests IN PEDIATRIC BOTTLE Blood Culture adequate volume  Final   Culture   Final    NO GROWTH 2 DAYS Performed at [facility], [place] [address], [place], [place] [address]    Report Status PENDING  Incomplete         Radiology Studies: Ct Bone Marrow Biopsy & Aspiration  Result Date: 03/24/2017 CLINICAL DATA:  Pancytopenia and neutropenia. MSSA bacteremia. History of IV drug abuse, bacterial endocarditis and right sternoclavicular septic arthritis. EXAM: CT GUIDED BONE MARROW ASPIRATION AND BIOPSY ANESTHESIA/SEDATION: Versed 4.0 mg IV, Fentanyl 150 mcg IV Total Moderate Sedation Time:   25 minutes. The patient's level of consciousness and physiologic status were continuously monitored during the procedure by Radiology nursing. PROCEDURE: The procedure risks, benefits, and alternatives were explained to the patient's mother. Questions regarding the procedure were encouraged and answered. The patient's mother understands and consents to the procedure. A time out was performed prior to initiating the procedure. The right gluteal region was prepped with chlorhexidine. Sterile gown and sterile gloves were used for the procedure. Local anesthesia was provided with 1% Lidocaine. Under CT guidance, an 11 gauge On Control bone cutting needle was advanced from a posterior approach into the right iliac bone. Needle positioning  was confirmed with CT. Initial non heparinized and heparinized aspirate samples were obtained of bone marrow. Core biopsy was performed via the On Control drill needle. Two separate core biopsy samples were obtained. COMPLICATIONS: None FINDINGS: Inspection of initial aspirate did reveal visible particles. Intact core biopsy samples were obtained. A second core  biopsy sample was obtained and placed in a sterile container in the event that culture analysis is necessary. IMPRESSION: CT guided bone marrow biopsy of right posterior iliac bone with both aspirate and core samples obtained. Electronically Signed   By: Aletta Edouard M.D.   On: 03/24/2017 11:59        Scheduled Meds: . buprenorphine-naloxone  2 tablet Sublingual Daily  . DULoxetine  60 mg Oral BID  . ferrous sulfate  325 mg Oral TID WC  . gadobenate dimeglumine  15 mL Intravenous Once  . multivitamin with minerals  1 tablet Oral Daily  . nicotine  7 mg Transdermal Q24H  . OLANZapine  5 mg Oral q morning - 10a  . pantoprazole (PROTONIX) IV  40 mg Intravenous Q12H  . polyethylene glycol  17 g Oral Daily  . senna-docusate  1 tablet Oral BID   Continuous Infusions: . sodium chloride    .  ceFAZolin (ANCEF) IV Stopped (03/25/17 0631)     LOS: 40 days    Time spent: [place], MD [facility]  If 7PM-7AM, please contact night-coverage [email or web address] Password [facility] 03/25/2017, 10:05 AM

## 2017-03-25 NOTE — Progress Notes (Signed)
tPA ordered for occluded line. Nurse will contact IV team once is arrives to the unit. [name], Tricia [place] M

## 2017-03-25 NOTE — Consult Note (Signed)
[name]  Referring Physician: Dr. Jonnie Finner Primary Care Physician: Jani Gravel, MD Patient Location at Initial Consult: Inpatient Chief Complaint/Reason for Consult: left neck cellulitis  History of Presenting Illness:  History obtained from [place] is a  32 y.o. female presenting with left neck swelling. This has been improving initially, then slowly progressive over the last couple of days. There is no limitation with mouth-opening, no stridor, no difficulty with head turning, no fever.  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 Began to trigger sepsis on 3/6. Had CT neck done 3/7 (see below for results). Has also recently struggled with neutropenia (WBC 1.9). Bone marrow biopsy done yesterday.  Has been found to have a septic arthritis, anemia as well. Mental decline with poor clearance of secretions beginning today. SLP recommended NPO status.  No stridor, desaturations, or respiratory distress.   Past Medical History:  Diagnosis Date  . Anemia, unspecified   . Anxiety   . Asthma   . Disc herniation    causes sciatica unsure which disc  . Esophageal reflux   . Fatty liver   . Fibromyalgia   . Gastroparesis   . Headache(784.0)   . Heart murmur   . History of narcotic addiction ([place]) 09/30/2012   2010:  Per pt, was addicted to narcotics; mom helped intervene and stopped taking opiods   . Hyperemesis gravidarum with metabolic disturbance, unspecified as to episode of care   . Irritable bowel syndrome   . MSSA bacteremia 02/20/2017  . Other and unspecified noninfectious gastroenteritis and colitis(558.9)   . Pregnant   . PTSD (post-traumatic stress disorder)   . Septic arthritis of right sternoclavicular joint ([place]) 02/20/2017  . Unspecified asthma(493.90)     Past Surgical History:  Procedure Laterality Date  .  APPENDECTOMY    . CHOLECYSTECTOMY    . COLONOSCOPY    . LAPAROSCOPIC BILATERAL SALPINGECTOMY Bilateral 12/08/2012   Procedure: LAPAROSCOPIC BILATERAL SALPINGECTOMY;  Surgeon: Jonnie Kind, MD;  Location: [facility];  Service: Gynecology;  Laterality: Bilateral;  . MULTIPLE EXTRACTIONS WITH ALVEOLOPLASTY N/A 12/01/2015   Procedure: EXTRACTION TEETH TWO, THREE, FOUR, SIX, SEVEN, EIGHT, NINE, TEN, ELEVEN, TWELVE, FOURTEEN, FIFTEEN, TWENTY, TWENTY ONE, TWENTY EIGHT, TWENTY NINE, THIRTY AND THIRTY ONE WITH ALVEOLOPLASTY;  Surgeon: Diona Browner, DDS;  Location: [facility];  Service: Oral Surgery;  Laterality: N/A;  . TEE WITHOUT CARDIOVERSION N/A 02/20/2017   Procedure: TRANSESOPHAGEAL ECHOCARDIOGRAM (TEE);  Surgeon: Larey Dresser, MD;  Location: [facility];  Service: Cardiovascular;  Laterality: N/A;  . TUBAL LIGATION      Family History  Problem Relation Age of Onset  . Ulcerative colitis Paternal Grandmother   . Cancer Paternal Grandmother   . Colon polyps Paternal Grandfather   . Cancer Paternal Grandfather        thyroid  . Diabetes Father   . Heart disease Father   . Kidney disease Father   . Hypertension Father     Social History   Socioeconomic History  . Marital status: Single    Spouse name: None  . Number of children: 2  . Years of education: None  . Highest education level: None  Social Needs  . Financial resource strain: None  . Food insecurity - worry: None  . Food insecurity - inability: None  . Transportation needs - medical: None  . Transportation needs - non-medical: None  Occupational History  . Occupation: CNA  Tobacco Use  . Smoking status: Current Every Day Smoker    Packs/day: 2.00    Years: 10.00    Pack years: 20.00    Types: Cigarettes  . Smokeless tobacco: Never Used  Substance and Sexual Activity  . Alcohol use: No  . Drug use: No    Comment: pt denies 11/30/15  . Sexual activity: No    Birth control/protection: Surgical  Other Topics Concern   . None  Social History Narrative   Daily caffeine     No current facility-administered medications on file prior to encounter.    Current Outpatient Medications on File Prior to Encounter  Medication Sig Dispense Refill  . acetaminophen (TYLENOL) 500 MG tablet Take 1,000 mg by mouth every 6 (six) hours as needed for mild pain or moderate pain.    Marland Kitchen ALPRAZolam (XANAX) 0.5 MG tablet Take 1 tablet (0.5 mg total) by mouth 3 (three) times daily as needed for anxiety. (Patient taking differently: Take 0.5 mg by mouth 4 (four) times daily. ) 21 tablet 0  . Buprenorphine HCl-Naloxone HCl (SUBOXONE) 4-1 MG FILM Place 2 Film under the tongue daily.     . DULoxetine (CYMBALTA) 60 MG capsule Take 60 mg by mouth 2 (two) times daily.    . Multiple Vitamin (MULTIVITAMIN WITH MINERALS) TABS tablet Take 1 tablet by mouth daily. ALL NATURAL VITAMIN: Made by [employer] (system six)    . OLANZapine (ZYPREXA) 5 MG tablet Take 1 tablet by mouth every morning.    . ondansetron (ZOFRAN) 4 MG tablet Take 1 tablet (4 mg total) by mouth every 6 (six) hours. For nausea or vomiting 12 tablet 0  . cyclobenzaprine (FLEXERIL) 10 MG tablet Take 1 tablet (10 mg total) by mouth 3 (three) times daily. 12 tablet 0    Allergies  Allergen Reactions  . Azithromycin Nausea And Vomiting    ABDOMINAL PAIN  . Nsaids     UNSPECIFIED REACTION    . Hydrocodone Nausea And Vomiting  . Penicillins Nausea And Vomiting     Has patient had a PCN reaction causing immediate rash, facial/tongue/throat swelling, SOB or lightheadedness with hypotension: No Has patient had a PCN reaction causing severe rash involving mucus membranes or skin necrosis: No Has patient had a PCN reaction that required hospitalization No Has patient had a PCN reaction occurring within the last 10 years: No If all of the above answers are "NO", then may proceed with Cephalosporin use.   . Prednisone Nausea Only and Other (See Comments)    Can take shot, but  pill form "caused stomach pain , nausea"     Review of Systems: Completed, See above HPI   OBJECTIVE: Vital Signs: Vitals:   03/25/17 1239 03/25/17 1436  BP:    Pulse:    Resp:    Temp: 98.2 F (36.8 C) 99.7 F (37.6 C)  SpO2:      I&O  Intake/Output Summary (Last 24 hours) at 03/25/2017 1602 Last data filed at 03/24/2017 2227 Gross per 24 hour  Intake 250 ml  Output 700 ml  Net -450 ml    Physical Exam General: Well developed, well nourished. No acute distress. Voice with breathy-quality, no stridor. Moderately cooperative  Head/Face: Normocephalic, atraumatic. No scars or lesions. No sinus tenderness. Facial nerve intact and equal bilaterally.  No facial lacerations. Salivary glands non tender and without palpable masses  Eyes: Globes well positioned, no proptosis Lids: No periorbital edema/ecchymosis. No lid laceration  Conjunctiva: No chemosis, hemorrhage PERRL  Ears: No gross deformity. Normal external canal.    Hearing: Normal speech reception.  Nose: No gross deformity or lesions. No purulent discharge. Septum midline. No turbinate hypertrophy.  Mouth/Oropharynx: Lips without any lesions. Dentition fair- wears partial dentures. There is pharyngeal exudate and casts. Very thickened posterior oral secretions. No obvious mucosal lesions within the oropharynx. Took multiple attempts at suctioning to clear thickened secretions. No tonsillar enlargement, exudate, or lesions. Left posterior pharyngeal wall with mild edema. Uvula midline. Tongue midline without lesions. No trismus  Larynx: See TFL  Nasopharynx: See TFL  Neck: Trachea midline. No masses. No thyromegaly or nodules palpated. No crepitus.  Lymphatic: There is mild erythema over the left neck extending from the midline, to the left submandibular region and down to the clavicle. There is edema, tenderness, and warmth. Minimal fluctuance.   Respiratory: No stridor or distress. Stable on room air, no stridor or  stertor  Cardiovascular: Regular rate and rhythm.  Extremities: No edema or cyanosis. Warm and well-perfused.  Skin: No scars or lesions on face or neck.  Neurologic: CN II-XII intact. Moving all extremities without gross abnormality.  Other:  Normal ROM neck    Labs: Lab Results  Component Value Date   WBC 1.9 (L) 03/25/2017   HGB 8.2 (L) 03/25/2017   HCT 23.7 (L) 03/25/2017   PLT 249 03/25/2017   ALT 9 (L) 03/22/2017   AST 11 (L) 03/22/2017   NA 133 (L) 03/24/2017   K 3.2 (L) 03/24/2017   CL 103 03/24/2017   CREATININE 0.75 03/24/2017   BUN 7 03/24/2017   CO2 20 (L) 03/24/2017   TSH 1.740 02/03/2009   INR 1.40 03/22/2017   HGBA1C 5.6 02/03/2009     Review of Ancillary Data / Diagnostic Tests: Extensive records reviewed- SLP evaluation, Dr. Jonnie Finner notes, Infectious Disease Notes, labs (WBC 1.9 today). Mental decline noted today, which may be contributing to her poor secretion tolerance.   CT neck 03/20/17 reviewed- there is left SCM thickening and surrounding cellulitis extending to the platysma. This is the typical infectious pattern often seen in IVDA usage in the neck, though this appears to be more remote. There is mild left submandibular swelling as well. Mild stranding in the platysma.   Procedure: Transnasal Flexible Laryngoscopy  Preoperative Diagnosis: poor tolerance of secretions, concerning for dysphagia Postoperative Diagnosis: Left posterior pharyngeal edema, thickened, cast-like left posterior oropharynx secretions, reduced mobility of left cord, Patent glottis. Normal epiglottis.  Procedure: Transnasal fiberoptic laryngoscopy.  Estimated Blood Loss: 0 mL.  Complications: None.  Findings: The nasal cavity and nasopharynx are unremarkable. There are no suspicious findings in the nasopharynx or Fossa of [place]mller. The tongue base, epiglottis and postcricoid region are normal in appearance. The left vallecula is somewhat blunted (? Secretions vs actual edema  extending from left posterior pharyngeal wall?). There is no epiglottic or AE fold edema, arytenoids are normal. Preserved right vocal fold mobility with left hypomobility. Left posterior pharyngeal edema with thickened, cast-like left posterior oropharynx secretions, reduced mobility of left cord, Patent glottis. There was significant pooling of secretions.   Description of Procedure: With the patient in the sitting position, topical  Afrin-lidocaine mixture in an atomizer was applied to the nose. The scope was passed through the nose. Examination was carried out of the nose, nasopharynx, oropharynx, hypopharynx, and larynx with findings as noted above. Scope was removed.  The patient tolerated the procedure well.    ASSESSMENT:  32 y.o. 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 Due to patient's neutropenia, she may in fact be developing an abscess-like process but may not have the neutrophils to form a more mature abscess. Repeat imaging would be of benefit.   RECOMMENDATIONS: -Repeat CT neck with contrast -Will follow.  -Appreciate SLP input regarding PO status. Agree with NPO status for now -Patient with limited airway protection due to poor clearance mechanisms. She continues to demonstrate preserved cough, but should be monitored closely.    Gavin Pound, MD  [facility], [place] Office phone [phone]

## 2017-03-25 NOTE — Progress Notes (Signed)
While rounding, pt denies SOB. Productive cough with upper airway secretions.  Pt can orally suction herself.  She is alert and in no visible distress.  RA sats 86%.  Pt placed on [place] at 2L.  Sats now 92%.  Handoff to 5W RN.

## 2017-03-25 NOTE — Progress Notes (Signed)
CBC from 03/25/2017 was reviewed and showed a white cell count of 1.9 without differential. Hemoglobin remains stable at 8.2 with a platelet count that is within normal range.  These findings support the diagnosis of neutropenia related to medication effect as we are seeing recovery in the white cell count. Bone marrow biopsy was completed on 03/24/2017.  No further Hematology recommendations at this time.

## 2017-03-25 NOTE — Evaluation (Signed)
Clinical/Bedside Swallow Evaluation Patient Details  Name: [name] MRN: [id number] Date of Birth: 27-Dec-1985  Today's Date: 03/25/2017 Time: SLP Start Time (ACUTE ONLY): 1520 SLP Stop Time (ACUTE ONLY): 1534 SLP Time Calculation (min) (ACUTE ONLY): 14 min  Past Medical History:  Past Medical History:  Diagnosis Date  . Anemia, unspecified   . Anxiety   . Asthma   . Disc herniation    causes sciatica unsure which disc  . Esophageal reflux   . Fatty liver   . Fibromyalgia   . Gastroparesis   . Headache(784.0)   . Heart murmur   . History of narcotic addiction ([facility]) 09/30/2012   2010:  Per pt, was addicted to narcotics; mom helped intervene and stopped taking opiods   . Hyperemesis gravidarum with metabolic disturbance, unspecified as to episode of care   . Irritable bowel syndrome   . MSSA bacteremia 02/20/2017  . Other and unspecified noninfectious gastroenteritis and colitis(558.9)   . Pregnant   . PTSD (post-traumatic stress disorder)   . Septic arthritis of right sternoclavicular joint ([facility]) 02/20/2017  . Unspecified asthma(493.90)    Past Surgical History:  Past Surgical History:  Procedure Laterality Date  . APPENDECTOMY    . CHOLECYSTECTOMY    . COLONOSCOPY    . LAPAROSCOPIC BILATERAL SALPINGECTOMY Bilateral 12/08/2012   Procedure: LAPAROSCOPIC BILATERAL SALPINGECTOMY;  Surgeon: Tilda Burrow, MD;  Location: [facility];  Service: Gynecology;  Laterality: Bilateral;  . MULTIPLE EXTRACTIONS WITH ALVEOLOPLASTY N/A 12/01/2015   Procedure: EXTRACTION TEETH TWO, THREE, FOUR, SIX, SEVEN, EIGHT, NINE, TEN, ELEVEN, TWELVE, FOURTEEN, FIFTEEN, TWENTY, TWENTY ONE, TWENTY EIGHT, TWENTY NINE, THIRTY AND THIRTY ONE WITH ALVEOLOPLASTY;  Surgeon: Ocie Doyne, DDS;  Location: [facility];  Service: Oral Surgery;  Laterality: N/A;  . TEE WITHOUT CARDIOVERSION N/A 02/20/2017   Procedure: TRANSESOPHAGEAL ECHOCARDIOGRAM (TEE);  Surgeon: Laurey Morale, MD;  Location: [facility] ENDOSCOPY;  Service:  Cardiovascular;  Laterality: N/A;  . TUBAL LIGATION     HPI:  Pt is a 32 y.o. female, with past medical history significant for fibromyalgia, anemia, GERD, and history of IV drug abuse. She presented to the ED with a few days history of fever, chills, and generalized weakness muscle aches in addition to arthralgias.  She was admitted with diagnosis of sepsis.  Pt being treated for endocarditis. On 3/6 she developed erythema of her upper chest/L neck. CT showed myositis of L sternocleidomastoid w/ local cellulitis/ inflammation, but no abscess noted. Pt started to have difficulty swallowing and managing her secretions, so SLP and ENT were consulted on 3/12.   Assessment / Plan / Recommendation Clinical Impression  Pt is lethargic this afternoon, requiring constant stimulation to maintain alertness for even a few seconds at a time. She could not stay alert to complete an oral motor exam, although trying to visualize into her oral cavity she appeared to have small, white particles (grits?) scattered around the posterior end of her dentures. Her vocal quality is aphonic at baseline but with moderate, audible wetness. SLP provided Mod cues for volitional coughing to clear minimal, frothy secretions with oral suction. After this was cleared she could produce brief phonation (1-2 seconds at a time) that was low in intensity, with speech sounding hypernasal. She quickly started to sound wet again, and did not produce further phonation. SLP provided few ice chips while alert to try to facilitate clearance of secretions, with minimal hyolaryngeal movement noted upon palpation and delayed coughing noted. Pt is not appropriate for POs at  this time given signs concerning for dysphagia/reduced airway protection that is further exacerbated by her mentation. Would keep NPO with emphasis on oral care with additional SLP f/u on next date if more alert. SLP Visit Diagnosis: Dysphagia, oropharyngeal phase (R13.12)     Aspiration Risk  Severe aspiration risk    Diet Recommendation NPO   Medication Administration: Via alternative means    Other  Recommendations Recommended Consults: Other (Comment)(ENT consult pending) Oral Care Recommendations: Oral care QID Other Recommendations: Have oral suction available   Follow up Recommendations (tba)      Frequency and Duration min 3x week  2 weeks       Prognosis Prognosis for Safe Diet Advancement: Good Barriers to Reach Goals: Cognitive deficits      Swallow Study   General HPI: Pt is a 32 y.o. female, with past medical history significant for fibromyalgia, anemia, GERD, and history of IV drug abuse. She presented to the ED with a few days history of fever, chills, and generalized weakness muscle aches in addition to arthralgias.  She was admitted with diagnosis of sepsis.  Pt being treated for endocarditis. On 3/6 she developed erythema of her upper chest/L neck. CT showed myositis of L sternocleidomastoid w/ local cellulitis/ inflammation, but no abscess noted. Pt started to have difficulty swallowing and managing her secretions, so SLP and ENT were consulted on 3/12. Type of Study: Bedside Swallow Evaluation Previous Swallow Assessment: none in chart Diet Prior to this Study: NPO Temperature Spikes Noted: Yes(102) Respiratory Status: Room air History of Recent Intubation: No Behavior/Cognition: Lethargic/Drowsy;Requires cueing Oral Cavity Assessment: Other (comment)(?food particles - looks like maybe grits) Oral Cavity - Dentition: Dentures, top;Other (Comment)(difficult to see bottom) Vision: (can't keep eyes open) Self-Feeding Abilities: Needs assist Patient Positioning: Upright in bed Baseline Vocal Quality: Wet;Low vocal intensity;Aphonic;Other (comment)(intermittently aphonic versus hypernasal) Volitional Cough: Weak;Wet Volitional Swallow: Able to elicit(but with minimal movement appreciated to palpation)    Oral/Motor/Sensory  Function Overall Oral Motor/Sensory Function: Other (comment)(pt unable to stay alert to complete oral motor exam)   Ice Chips Ice chips: Impaired Presentation: Spoon Pharyngeal Phase Impairments: Suspected delayed Swallow;Decreased hyoid-laryngeal movement;Wet Vocal Quality;Cough - Delayed   Thin Liquid Thin Liquid: Not tested    Nectar Thick Nectar Thick Liquid: Not tested   Honey Thick Honey Thick Liquid: Not tested   Puree Puree: Not tested   Solid   GO   Solid: Not tested        Maxcine Hamaiewonsky, Miya Luviano 03/25/2017,3:57 PM   Maxcine HamLaura Paiewonsky, M.A. CCC-SLP [phone]

## 2017-03-25 NOTE — Progress Notes (Signed)
Patient's axillary temperature 100.0. Patient is NPO, only have PO orders for fever relief. MD paged. Will continue to monitor.

## 2017-03-25 NOTE — Progress Notes (Addendum)
Responded to bedside at request of primary RN in reference to evaluation of pt's airway.    The pt was sitting upright in bed, alert, oriented x 3.  External swelling and redness to neck present.  She exhibited a strong cough on command.  There was no stridor present.  Upper airway congestion was present, and the pt stated that she had successfully coughed up some phlegm prior to my arrival.  Bilateral breath sounds were clear.  SpO2 was 94%, HR 120s ST on monitor, BP 118/70.    ENT cart noted to be at the bedside.  Pt remains on continuous pulse oximetry and telemetry.  The pt was placed on our radar and the night shift rapid team has been updated on the case with plans to round at start of shift.

## 2017-03-25 NOTE — Progress Notes (Signed)
Spoke with ENT, pt has inflammation of L post pharyngeal wall related to her cellulitis/ myositis in the left neck.  She has pooling of secretions near the vocal cords but still has a decent cough.  No indication of resp failure at this time, no stridor, etc.  ENT recommends however low threshold for intubating if she gets worse.  ENT recommended IV decadron 10 mg x 3 doses, and repeat CT of neck.  Both ordered.    Vinson Moselleob Chukwuemeka Artola MD [facility] pgr [phone] 03/25/2017, 5:17 PM

## 2017-03-26 ENCOUNTER — Inpatient Hospital Stay (HOSPITAL_COMMUNITY): Payer: Medicaid Other

## 2017-03-26 DIAGNOSIS — E876 Hypokalemia: Secondary | ICD-10-CM

## 2017-03-26 DIAGNOSIS — D61818 Other pancytopenia: Secondary | ICD-10-CM

## 2017-03-26 LAB — CBC
HCT: 23.7 % — ABNORMAL LOW (ref 36.0–46.0)
HEMOGLOBIN: 8.3 g/dL — AB (ref 12.0–15.0)
MCH: 30 pg (ref 26.0–34.0)
MCHC: 35 g/dL (ref 30.0–36.0)
MCV: 85.6 fL (ref 78.0–100.0)
Platelets: 250 10*3/uL (ref 150–400)
RBC: 2.77 MIL/uL — ABNORMAL LOW (ref 3.87–5.11)
RDW: 18.1 % — AB (ref 11.5–15.5)
WBC: 4.1 10*3/uL (ref 4.0–10.5)

## 2017-03-26 LAB — BASIC METABOLIC PANEL
Anion gap: 12 (ref 5–15)
BUN: 13 mg/dL (ref 6–20)
CALCIUM: 8.8 mg/dL — AB (ref 8.9–10.3)
CO2: 24 mmol/L (ref 22–32)
CREATININE: 0.69 mg/dL (ref 0.44–1.00)
Chloride: 101 mmol/L (ref 101–111)
GFR calc non Af Amer: >60 mL/min (ref >60)
Glucose, Bld: 159 mg/dL — ABNORMAL HIGH (ref 65–99)
Potassium: 2.7 mmol/L — CL (ref 3.5–5.1)
SODIUM: 137 mmol/L (ref 135–145)

## 2017-03-26 LAB — MAGNESIUM: MAGNESIUM: 1.9 mg/dL (ref 1.7–2.4)

## 2017-03-26 MED ORDER — RESOURCE THICKENUP CLEAR PO POWD
ORAL | Status: DC | PRN
  Filled 2017-03-26: qty 125

## 2017-03-26 MED ORDER — POTASSIUM CHLORIDE 10 MEQ/100ML IV SOLN
10.0000 meq | INTRAVENOUS | Status: AC
  Administered 2017-03-26 (×4): 10 meq via INTRAVENOUS
  Filled 2017-03-26 (×4): qty 100

## 2017-03-26 MED ORDER — MAGNESIUM SULFATE IN D5W 1-5 GM/100ML-% IV SOLN
1.0000 g | Freq: Once | INTRAVENOUS | Status: AC
  Administered 2017-03-26: 1 g via INTRAVENOUS
  Filled 2017-03-26: qty 100

## 2017-03-26 MED ORDER — POTASSIUM CHLORIDE CRYS ER 20 MEQ PO TBCR: 40.0000 meq | EXTENDED_RELEASE_TABLET | ORAL | Status: AC

## 2017-03-26 MED ORDER — ENOXAPARIN SODIUM 40 MG/0.4ML ~~LOC~~ SOLN
40.0000 mg | SUBCUTANEOUS | Status: DC
  Administered 2017-03-26 – 2017-04-14 (×19): 40 mg via SUBCUTANEOUS
  Filled 2017-03-26 (×20): qty 0.4

## 2017-03-26 MED ORDER — VANCOMYCIN HCL IN DEXTROSE 750-5 MG/150ML-% IV SOLN
750.0000 mg | Freq: Three times a day (TID) | INTRAVENOUS | Status: DC
  Administered 2017-03-26 – 2017-03-27 (×4): 750 mg via INTRAVENOUS
  Filled 2017-03-26 (×5): qty 150

## 2017-03-26 NOTE — Progress Notes (Signed)
   03/26/17 0608  Vital Signs  Pulse Rate (!) 54  ECG Heart Rate (!) 54  Resp 20  BP 124/76  Oxygen Therapy  SpO2 96 %   Pt asleep with vitals as above. Now weaned to RA holding sats. Pt continuously encouraged to cough/deep breath, and clear secretions via oral suction throughout shift--pt requires much encouragement with this, but compliant. Pt with HR now 50s-60s while asleep. Easily aroused, with HR 70s-80s when RN woke pt. Asymptomatic of HR.

## 2017-03-26 NOTE — Progress Notes (Signed)
CRITICAL VALUE ALERT  Critical Value:  K+ 2.7  Date & Time Notied:  03/26/17 @ 0850  Provider Notified: Dr Gonzella Lexhungel   Orders Received/Actions taken: MD aware, will await orders

## 2017-03-26 NOTE — Progress Notes (Signed)
Pharmacy Antibiotic Note  [name] is a 32 y.o. 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  Pt has now developed L neck cellulitis, cervical myositis.   Pharmacy has been consulted for Vancomycin dosing.  Pt also to continue on Ancef for MSSA endocarditis through 4/2.  Plan: Vancomycin 750mg   IV every 8 hours.  Goal trough 15-20 mcg/mL.  Height: 5\' 7"  (170.2 cm) Weight: 167 lb 15.9 oz (76.2 kg) IBW/kg (Calculated) : 61.6  Temp (24hrs), Avg:99.3 F (37.4 C), Min:98.2 F (36.8 C), Max:100 F (37.8 C)  Recent Labs  Lab 03/19/17 1541  03/21/17 0411 03/22/17 0224 03/23/17 0418 03/23/17 0737 03/24/17 0449 03/24/17 1157 03/25/17 0309 03/26/17 0801  WBC  --    < > 0.6* 0.7* 0.6* 0.5*  --  0.9* 1.9* 4.1  CREATININE  --    < > 0.82 0.82 0.78  --  0.75  --   --  0.69  LATICACIDVEN 1.0  --   --   --   --   --   --   --   --   --    < > = values in this interval not displayed.    Estimated Creatinine Clearance: 107.4 mL/min (by C-G formula based on SCr of 0.69 mg/dL).    Allergies  Allergen Reactions  . Azithromycin Nausea And Vomiting    ABDOMINAL PAIN  . Nsaids     UNSPECIFIED REACTION    . Hydrocodone Nausea And Vomiting  . Penicillins Nausea And Vomiting     Has patient had a PCN reaction causing immediate rash, facial/tongue/throat swelling, SOB or lightheadedness with hypotension: No Has patient had a PCN reaction causing severe rash involving mucus membranes or skin necrosis: No Has patient had a PCN reaction that required hospitalization No Has patient had a PCN reaction occurring within the last 10 years: No If all of the above answers are "NO", then may proceed with Cephalosporin use.   . Prednisone Nausea Only and Other (See Comments)    Can take shot, but pill form "caused stomach pain , nausea"    Antimicrobials this admission: Vanc 1/31>>2/1 Zosyn 1/31>>2/1 Ancef 2/1>>2/3; 2/6>>2/11 Nafcillin 2/11 >>3/6 -originally scheduled  through 4/2 (using 2/6 as D1) Bactrim PO 2/3>>2/5 Zyvox 2/5>>2/6 Cefepime 3/6>>3/9 Ancef 3/10 >>  Vanc 3/13 >>  Dose adjustments this admission:   Microbiology results: 1/31 BCx: 2/2 MSSA, 2/1 BCx: 1/1 MSSA, 2/4 BCx: 1/2 MSSA 2/6 BCx: NGF 3/6 BCx: ngtd 3/7: rapid strep negative 3/8 bcx: ngtd  Thank you for allowing pharmacy to be a part of this patient's care.  Toys 'R' UsKimberly Konner Warrior, Pharm.D., BCPS Clinical Pharmacist Pager: [phone] Clinical phone for 03/26/2017 from 8:30-4:00 is [phone]. After 4pm, please call Main Rx [phone]) for assistance. 03/26/2017 11:49 AM

## 2017-03-26 NOTE — Progress Notes (Signed)
There is also the bone marrow biopsy discussed personally with Dr. Gari Crown the reviewing pathologist. The bone marrow shows a lot of changes that appear to be secondary in nature and likely consistent with medication effect. No acute malignancy detected at this time.   CT scan of the neck obtained was reviewed and showed worsening edema.  White cell count is currently pending from today but appears to be improved in last 24 hours. We will continue to follow her labs and review for any other recommendations.

## 2017-03-26 NOTE — Progress Notes (Signed)
Nutrition Follow-up  DOCUMENTATION CODES:   Not applicable  INTERVENTION:  Magic cup TID with meals, each supplement provides 290 kcal and 9 grams of protein  Vital Cuisine BID, each supplement provides 520kcal and 22g of protein.   NUTRITION DIAGNOSIS:   Inadequate oral intake related to lethargy/confusion, poor appetite as evidenced by meal completion < 50% -ongoing  GOAL:   Patient will meet greater than or equal to 90% of their needs -unmet  MONITOR:   PO intake, Supplement acceptance, Labs, Weight trends, Skin, I & O's  REASON FOR ASSESSMENT:   Malnutrition Screening Tool    ASSESSMENT:   32 y.o. female w/ a hx of fibromyalgia, anemia, and IV drug abuse who presented with a few days history of fever, chills, generalized weakness muscle aches, and arthralgias.  In the emergency room the patient was noted to have fever, leukocytosis and left hand and bilateral lower extremity redness and petechiae.    Patient not in room this AM, was undergoing MBSS. Now on NDD1-Honey Thickened liquids related to pharyngeal edema. At high risk of aspiration. RD will monitor PO intake.  Labs reviewed:  K+ 2.7  Medications reviewed and include:  Iron, MVI w/ Minerals, Miralax, 40K+, Senokot-S D5 1/2 NS at 4465mL/hr --> 265 calories 10 K+ runs x4  Diet Order:  DIET - DYS 1 Room service appropriate? Yes; Fluid consistency: Honey Thick  EDUCATION NEEDS:   Education needs have been addressed  Skin:  Skin Assessment: Reviewed RN Assessment  Last BM:  03/23/2017  Height:   Ht Readings from Last 1 Encounters:  03/11/17 5\' 7"  (1.702 m)    Weight:   Wt Readings from Last 1 Encounters:  03/26/17 167 lb 15.9 oz (76.2 kg)    Ideal Body Weight:  61.4 kg  BMI:  Body mass index is 26.31 kg/m.  Estimated Nutritional Needs:   Kcal:  1700-1900  Protein:  90-105 grams  Fluid:  1.7-1.9 L  Dionne AnoWilliam M. Khaleel Beckom, MS, RD LDN Inpatient Clinical Dietitian Pager [phone]

## 2017-03-26 NOTE — Progress Notes (Addendum)
ENT Consult Progress Note  Subjective:  Did well overnight. Tmax 100. CT scan completed overnight. No stridor or difficulty breathing.  States she was able to cough and suction a lot of her secretions overnight. No difficulty swallowing, feels neck is improving.  Completing 3 doses of IV decadron 10mg .   Objective: Vitals:   03/26/17 0344 03/26/17 0608  BP: 98/67 124/76  Pulse: 65 (!) 54  Resp: (!) 24 20  Temp:    SpO2: 92% 96%    Physical Exam: CONSTITUTIONAL: well developed, nourished, no distress  CARDIOVASCULAR: normal rate and regular rhythm PULMONARY/CHEST WALL: effort normal and no stridor, no stertor, no dysphonia HENT: Head : normocephalic and atraumatic  Mouth/Throat:  Mouth: uvula midline and no oral lesions Throat: oropharynx clear and moist  Mucous membranes:improved thickened secretions in oropharynx. No cast-like secretions on examination today EYES: conjunctiva normal, EOM normal and PERRL NECK: Left neck erythema perhaps slightly worse (more erythematous). Neck is a little softer. Full ROM. No fluctuance.    Data Review/Consults/Discussions:  CT neck 3/13- no drainable fluid collections. There is minimal fluid in the retropharyngeal space. There is some right-ward shift of the larynx from the lateral pharyngeal wall edema (but no abscess), as noted on TFL scope examination yesterday evening. Airway remains patent. No swelling of epiglottis or AE folds, or at the level of the glottis.   Assessment: [name] 32 y.o. female who presents with left cervical myositis, left cervical adenitis and lymphadenopathy in the setting of IVDA. No drainable fluid collections currently. Will monitor.    Plan:  -Consider broadening IV antibiotic coverage (Vancomycin/Zosyn?) due to persistent myositis, lymphadenitis and cellulitis of the left neck -No plans for drainage at this time -Continue working with SLP for feeding recommendations -Continue oral suctioning and  consider nasopharyngeal suctioning of secretions -Will follow   [facility] the care of this patient. Should you need further assistance, please call our office at [phone].    [name], MD

## 2017-03-26 NOTE — Progress Notes (Signed)
                                  PROGRESS NOTE                                                                                                                                                                                                             Patient Demographics:    [name], is a 32 y.o. female, DOB - 05/05/1985, MRN:[id number]  Admit date - 02/13/2017   Admitting Physician Ali Hijazi, MD  Outpatient Primary MD for the patient is Kim, James, MD  LOS - 41  Outpatient Specialists:none  Chief Complaint  Patient presents with  . 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  Patient felt not to be a candidate for valve at this time due to ongoing drug use Valve replacement due to ongoing drug use. She was also found to have right sternal clavicle or septic arthritis.  She was initially maintained on IV nafcillin She was initially maintained on IV nafcillin in the hospital ( with plan on 6-8 weeks of IV antibiotics in the hospital ( as she was deemed unsafe to be discharged home with PICC line and was not accepted to any facilities).  Hospital course with improvement of sepsis on contrast 06/2017. Hospital course complicated with development of sepsis on 03/19/2017 with pancytopenia. She also developed worsening cellulitis and myositis of the left neck and shoulder. Patient also developed anemia with anemia with lower GI bleed lower GI bleed.     subjective Patient was tachycardic  Briefly overnight with O2 sat dropping down to high 80s.overnight with O2 sat dropping down to high 80s.   She also had She also had temperature of 100 F she denies any pain in neck today.  Feels weak..    Assessment  & Plan :   Principal problem MSSA tricuspid valve endocarditis Admitted on 1/31.  MRI on 2/14 for right sternoclavicular septic arthritis.  Patient was doing well on IV nafcillin and afebrile on 2/6 when she developed spiking temperature and pain and erythema in the   upper mid chest CT showing to the mastoid with local cellulitis without abscess.  She also has associated anemia.  Blood cultures (3/6 and 3/8 negative). Nafcillin was switched to cefepime for 3 days and then to Ancef (suspecting accident could have caused bone marrow suppression). Bone marrow biopsy from 3/11 and findings suggest antibiotic induced marrow suppression. -She would complete 6 weeks of antibiotics on 2/14 but with her neck cellulitis he would need weeks of antibiotics (3/28).  Left Neck cellulitis Developed on 2/6.  Associated left cervical myositi no drainable fluid collection on CT he setting of IV drug use.  (Although repeat CT from 2/12 appear to worsening swelling). ENT consult appreciated.  Airway intact.  Recommends no need for drainage and to broaden antibiotic coverage.  Completed 3 doses of  IV decadron. -I will add empiric IV vancomycin given her recent neutropenia with nafcillin I will continue Ancef for now. -Continue oral suction in order via nasal cannula. -SLP evaluation.  ( currently n.p.o. with sips with meds).   Depression/chronic pain Continue Cymbalta twice daily.  OxyIR 2.5 mg every 6 hours.  Continue Zyprexa.  Anemia/pancytopenia Has iron deficiency as well as bone marrow suppression with sepsis/endocarditis and IV nafcillin.  Received PRBC on 3/8.  Noted to have blood in stool.  GI was consulted who thought this was not in the setting of active GI bleed.  DIC panel negative for schistocytes but with elevated 510. -Continue IV PPI twice daily GI recommended no need for intervention.   Continue currently stable. WBCs improved today.  Did not require neupogen.  BM biopsy suggest secondary changes possibly due to medication. Hematology consult appreciated.   Hypokalemia Replenished.  Check magnesium.      PICC placed 03/21/2017  Antimicrobials: IV Ancef 2/-2/8 Zyvox 2/5-2/6, 2/17-/18 IV nafcillin 2/8-3/6 IV cefepime 3/6-3/9 IV cefazolin 3/9 - IV vancomycin 3/13-    Code Status : Full code  Family Communication  : none At bedside  Disposition Plan  : Pending hospital course with prolonged abx possibly until 3/28  Barriers For Discharge : active symptoms, IV abx  Consults  :   ENT ( [facility]) [facility]ematology ( [facility])  Procedures  :  CT neck  BM bx Echo/ TEE   DVT Prophylaxis  :  - Lovenox  Lab Results  Component Value Date   PLT 250 03/26/2017    Antibiotics  :    Anti-infectives (From admission, onward)   Start     Dose/Rate Route Frequency Ordered Stop   03/22/17 1400  ceFAZolin (ANCEF) IVPB 2g/100 mL premix     2 g 200 mL/hr over 30 Minutes Intravenous Every 8 hours 03/22/17 1125 04/15/17 2359   03/19/17 1400  ceFEPIme (MAXIPIME) 2 g in sodium chloride 0.9 % 100 mL IVPB  Status:  Discontinued     2 g 200 mL/hr over 30 Minutes Intravenous Every 8 hours 03/19/17 1357 03/22/17 1125   03/02/17 2200  linezolid (ZYVOX) tablet 600 mg  Status:  Discontinued     600 mg Oral Every 12 hours 03/02/17 2037 03/03/17 1203   02/24/17 1600  nafcillin 2 g in sodium chloride 0.9 % 100 mL IVPB  Status:  Discontinued     2 g 200 mL/hr over 30 Minutes Intravenous Every 4 hours 02/24/17 0833 03/19/17 1411   02/21/17 2000  nafcillin 2 g in dextrose 5 % 100 mL IVPB  Status:  Discontinued     2 g 200 mL/hr over 30 Minutes Intravenous Every 4   hours 02/21/17 1741 02/24/17 0910   02/21/17 1730  nafcillin injection 2 g  Status:  Discontinued     2 g Intravenous Every 4 hours 02/21/17 1726 02/21/17 1740   02/19/17 1230  ceFAZolin (ANCEF) IVPB 2g/100  mL premix  Status:  Discontinued     2 g 200 mL/hr over 30 Minutes Intravenous Every 8 hours 02/19/17 1148 02/21/17 1726   02/18/17 1330  linezolid (ZYVOX) tablet 600 mg  Status:  Discontinued     600 mg Oral Every 12 hours 02/18/17 1250 02/19/17 1126   02/17/17 0800  sulfamethoxazole-trimethoprim (BACTRIM DS,SEPTRA DS) 800-160 MG per tablet 2 tablet  Status:  Discontinued     2 tablet Oral Every 12 hours 02/16/17 1453 02/18/17 1250   02/16/17 1600  sulfamethoxazole-trimethoprim (BACTRIM DS,SEPTRA DS) 800-160 MG per tablet 2 tablet     2 tablet Oral  Once 02/16/17 1509 02/16/17 1657   02/14/17 1400  ceFAZolin (ANCEF) IVPB 2g/100 mL premix  Status:  Discontinued     2 g 200 mL/hr over 30 Minutes Intravenous Every 8 hours 02/14/17 1013 02/16/17 1453   02/14/17 0200  vancomycin (VANCOCIN) IVPB 750 mg/150 ml premix  Status:  Discontinued     750 mg 150 mL/hr over 60 Minutes Intravenous Every 8 hours 02/13/17 1739 02/14/17 1017   02/14/17 0000  piperacillin-tazobactam (ZOSYN) IVPB 3.375 g  Status:  Discontinued     3.375 g 12.5 mL/hr over 240 Minutes Intravenous Every 8 hours 02/13/17 1739 02/14/17 1013   02/13/17 1715  piperacillin-tazobactam (ZOSYN) IVPB 3.375 g     3.375 g 100 mL/hr over 30 Minutes Intravenous  Once 02/13/17 1701 02/13/17 1824   02/13/17 1715  vancomycin (VANCOCIN) IVPB 1000 mg/200 mL premix  Status:  Discontinued     1,000 mg 200 mL/hr over 60 Minutes Intravenous  Once 02/13/17 1701 02/13/17 1705   02/13/17 1715  vancomycin (VANCOCIN) 1,500 mg in sodium chloride 0.9 % 500 mL IVPB     1,500 mg 250 mL/hr over 120 Minutes Intravenous  Once 02/13/17 1705 02/13/17 2300        Objective:   Vitals:   03/26/17 0344 03/26/17 0500 03/26/17 0608 03/26/17 0743  BP: 98/67  124/76 110/81  Pulse: 65  (!) 54 78  Resp: (!) _0 Temp:      TempSrc:      SpO2: 92%  96% 93%  Weight:  76.2 kg (167 lb 15.9 oz)    Height:        Wt Readings from Last 3 Encounters:    03/26/17 76.2 kg (167 lb 15.9 oz)  02/10/17 73.9 kg (163 lb)  06/13/16 73 kg (161 lb)     Intake/Output Summary (Last 24 hours) at 03/26/2017 1052 Last data filed at 03/25/2017 2340 Gross per 24 hour  Intake 752.83 ml  Output 500 ml  Net 252.83 ml     Physical Exam  Gen: not in distress, fatigued HEENT:  pallor+, moist mucosa, supple neck, left neck swelling without erythema, warmth or tenderness Chest: clear b/l, no added sounds CVS: O3&A9 normal, systolic murmur GI: soft, NT, ND, BS+ Musculoskeletal: warm, no edema CNS: AAOX3, non focal    Data Review:    CBC Recent Labs  Lab 03/19/17 1048  03/20/17 0428 03/21/17 0411  03/22/17 0224 03/23/17 0418 03/23/17 0737 03/24/17 1157 03/25/17 0309 03/26/17 0801  WBC  --    < > 0.4* 0.6*  --  0.7* 0.6* 0.5* 0.9* 1.9* 4.1  HGB  --    < > 7.8* 6.4*   < > 7.4* 7.2* 7.4* 7.8* 8.2* 8.3*  HCT  --    < > 23.9* 19.3*   < > 21.8* 20.5* 21.0* 23.0* 23.7* 23.7*  PLT  --    < > 251 158  --  146*  147* 160 171 212 249 250  MCV  --    < > 90.9 87.7  --  86.5 86.1 86.1 85.2 85.6 85.6  MCH  --    < > 29.7 29.1  --  29.4 30.3 30.3 28.9 29.6 30.0  MCHC  --    < > 32.6 33.2  --  33.9 35.1 35.2 33.9 34.6 35.0  RDW  --    < > 19.5* 19.4*  --  17.7* 18.4* 18.1* 17.7* 18.1* 18.1*  LYMPHSABS 0.5*  --  0.4* 0.6*  --  0.7  --  0.5*  --   --   --   MONOABS 0.0*  --  0.0* 0.0*  --  0.0*  --  0.0*  --   --   --   EOSABS 0.0  --  0.0 0.0  --  0.0  --  0.0  --   --   --   BASOSABS 0.0  --  0.0 0.0  --  0.0  --  0.0  --   --   --    < > = values in this interval not displayed.    Chemistries  Recent Labs  Lab 03/21/17 0411 03/22/17 0224 03/23/17 0418 03/24/17 0449 03/26/17 0801  NA 135 132* 132* 133* 137  K 3.2* 3.5 3.2* 3.2* 2.7*  CL 109 107 103 103 101  CO2 17* 16* 19* 20* 24  GLUCOSE 78 107* 97 87 159*  BUN 11 10 9 7 13  CREATININE 0.82 0.82 0.78 0.75 0.69  CALCIUM 8.0* 8.4* 8.8* 8.9 8.8*  MG  --  1.6*  --   --  1.9  AST  --  11*   --   --   --   ALT  --  9*  --   --   --   ALKPHOS  --  69  --   --   --   BILITOT  --  2.0*  --   --   --    ------------------------------------------------------------------------------------------------------------------ No results for input(s): CHOL, HDL, LDLCALC, TRIG, CHOLHDL, LDLDIRECT in the last 72 hours.  Lab Results  Component Value Date   HGBA1C 5.6 02/03/2009   ------------------------------------------------------------------------------------------------------------------ No results for input(s): TSH, T4TOTAL, T3FREE, THYROIDAB in the last 72 hours.  Invalid input(s): FREET3 ------------------------------------------------------------------------------------------------------------------ No results for input(s): VITAMINB12, FOLATE, FERRITIN, TIBC, IRON, RETICCTPCT in the last 72 hours.  Coagulation profile Recent Labs  Lab 03/22/17 0224  INR 1.40    No results for input(s): DDIMER in the last 72 hours.  Cardiac Enzymes No results for input(s): CKMB, TROPONINI, MYOGLOBIN in the last 168 hours.  Invalid input(s): CK ------------------------------------------------------------------------------------------------------------------    Component Value Date/Time   BNP 222.5 (H) 03/07/2017 1728    Inpatient Medications  Scheduled Meds: . buprenorphine-naloxone  2 tablet Sublingual Daily  . DULoxetine  60 mg Oral BID  . ferrous sulfate  325 mg Oral TID WC  . gadobenate dimeglumine  15 mL Intravenous Once  . multivitamin with minerals  1 tablet Oral Daily  . nicotine  7 mg Transdermal Q24H  . OLANZapine  5 mg Oral q morning - 10a  .   pantoprazole (PROTONIX) IV  40 mg Intravenous Q12H  . polyethylene glycol  17 g Oral Daily  . potassium chloride  40 mEq Oral Q4H  . senna-docusate  1 tablet Oral BID   Continuous Infusions: . sodium chloride    .  ceFAZolin (ANCEF) IV Stopped (03/26/17 7711)  . dextrose 5 % and 0.45% NaCl 65 mL/hr at 03/26/17 0602   PRN  Meds:.acetaminophen, guaiFENesin, ibuprofen, magic mouthwash, naLOXone (NARCAN)  injection, ondansetron **OR** ondansetron (ZOFRAN) IV, oxyCODONE, phenol, sodium chloride, sodium chloride flush  Micro Results Recent Results (from the past 240 hour(s))  Culture, blood (Routine X 2) w Reflex to ID Panel     Status: None   Collection Time: 03/19/17 10:40 AM  Result Value Ref Range Status   Specimen Description BLOOD LEFT HAND  Final   Special Requests IN PEDIATRIC BOTTLE Blood Culture adequate volume  Final   Culture   Final    NO GROWTH 5 DAYS Performed at [facility], [place] [address] [place], [place] [address]    Report Status 03/24/2017 FINAL  Final  Culture, blood (Routine X 2) w Reflex to ID Panel     Status: None   Collection Time: 03/19/17 10:50 AM  Result Value Ref Range Status   Specimen Description BLOOD LEFT ARM  Final   Special Requests IN PEDIATRIC BOTTLE Blood Culture adequate volume  Final   Culture   Final    NO GROWTH 5 DAYS Performed at [facility], [place] [address], [place], [place] [address]    Report Status 03/24/2017 FINAL  Final  Rapid Strep Screen (Not at [facility])     Status: None   Collection Time: 03/20/17  8:20 AM  Result Value Ref Range Status   Streptococcus, Group A Screen (Direct) NEGATIVE NEGATIVE Final    Comment: (NOTE) A Rapid Antigen test may result negative if the antigen level in the sample is below the detection level of this test. The FDA has not cleared this test as a stand-alone test therefore the rapid antigen negative result has reflexed to a Group A Strep culture.   Culture, group A strep     Status: None   Collection Time: 03/20/17  8:20 AM  Result Value Ref Range Status   Specimen Description THROAT  Final   Special Requests NONE Reflexed from [id number]  Final   Culture   Final    NO GROUP A STREP (S.PYOGENES) ISOLATED Performed at [facility], [address] [place], [place] [address]    Report Status 03/22/2017  FINAL  Final  Culture, blood (routine x 2)     Status: None (Preliminary result)   Collection Time: 03/21/17 11:15 PM  Result Value Ref Range Status   Specimen Description BLOOD RIGHT HAND  Final   Special Requests IN PEDIATRIC BOTTLE Blood Culture adequate volume  Final   Culture   Final    NO GROWTH 3 DAYS Performed at [facility], [place] [address], [place], [place] [address]    Report Status PENDING  Incomplete  Culture, blood (routine x 2)     Status: None (Preliminary result)   Collection Time: 03/21/17 11:25 PM  Result Value Ref Range Status   Specimen Description BLOOD RIGHT HAND  Final   Special Requests IN PEDIATRIC BOTTLE Blood Culture adequate volume  Final   Culture   Final    NO GROWTH 3 DAYS Performed at [facility], [place],  [place] [address]    Report Status PENDING  Incomplete    Radiology Reports Dg Shoulder Right  Result Date: 03/14/2017 CLINICAL DATA:  Anterior and posterior RIGHT shoulder pain, septic RIGHT sternoclavicular joint since 02/13/2017, smoker EXAM: RIGHT SHOULDER - 2+ VIEW COMPARISON:  RIGHT shoulder MRI 02/27/2017 FINDINGS: Osseous mineralization normal. AC joint alignment normal. No glenohumeral fracture, dislocation, or bone destruction. Visualized ribs intact. RIGHT arm PICC line noted. IMPRESSION: No acute osseous abnormalities. Electronically Signed   By: Lavonia Dana M.D.   On: 03/14/2017 10:23   Ct Soft Tissue Neck W Contrast  Result Date: 03/26/2017 CLINICAL DATA:  Left-sided neck pain and difficulty swallowing. History of MSSA bacteremia. EXAM: CT NECK WITH CONTRAST TECHNIQUE: Multidetector CT imaging of the neck was performed using the standard protocol following the bolus administration of intravenous contrast. CONTRAST:  49m ISOVUE-300 IOPAMIDOL (ISOVUE-300) INJECTION 61% COMPARISON:  CT neck 03/20/2017 FINDINGS: Pharynx and larynx: --Nasopharynx: Fossae of Rosenmuller are clear. Normal adenoid tonsils for age.  --Oral cavity and oropharynx: Increased edema of the left palatine tonsil. No associated peritonsillar abscess. --Hypopharynx: Normal vallecula and pyriform sinuses. --Larynx: Normal epiglottis and pre-epiglottic space. Normal aryepiglottic and vocal folds. --Retropharyngeal space: No abscess or effusion. There is increased edema and/or lymphadenopathy of the retropharyngeal space at the level of the supraglottic larynx. Salivary glands: --Parotid: No mass lesion or inflammation. No sialolithiasis or ductal dilatation. --Submandibular: Unchanged reactive enlargement of the left submandibular gland. --Sublingual: Normal. No ranula or other visible lesion of the base of tongue and floor of mouth. Thyroid: Normal. Lymph nodes: Persistent multiple enlarged left cervical chain lymph nodes, measuring up to 14 mm. Left level 3 lymph nodes have increased in size, now measuring up to 12 mm, previously 8 mm. The degree of enhancement in the left-sided lymph nodes has diminished.Numerous subcentimeter right cervical lymph nodes. The right-sided adenopathy has slightly worsened. Vascular: Major cervical vessels are patent. There is increased mass effect on the left internal jugular vein, which remains widely patent. Limited intracranial: Normal. Visualized orbits: Normal. Mastoids and visualized paranasal sinuses: No fluid levels or advanced mucosal thickening. No mastoid effusion. Skeleton: Decreased soft tissue thickening at the right sternoclavicular joint. No acute osseous finding. Upper chest: Clear. Other: There is persistent inflammation of the left sternocleidomastoid with induration of the surrounding fat and thickening of the left platysma muscle. No abscess. There is increased edema within the left parapharyngeal space with associated worsening of rightward mass effect on the hypopharynx and larynx. There is edema versus lymphadenopathy within the retropharyngeal space at the level of the supraglottic larynx, which  is worsened from the prior study. IMPRESSION: 1. Worsened rightward mass effect on the hypopharynx and larynx secondary to worsened edema in the left parapharyngeal space and edema/lymphadenopathy in the retropharyngeal space. No retropharyngeal or peritonsillar abscess. 2. Unchanged appearance of left neck cellulitis and myositis, predominantly involving the sternocleidomastoid muscle. 3. Decreased enhancement of left cervical lymph nodes, with increased size of enlarged nodes at left level 3. Slightly worsened right cervical adenopathy. 4. Improved appearance of soft tissue thickening at the right sternoclavicular joint. Electronically Signed   By: KUlyses JarredM.D.   On: 03/26/2017 00:44   Ct Soft Tissue Neck W Contrast  Result Date: 03/20/2017 CLINICAL DATA:  Left-sided neck mass and fever EXAM: CT NECK WITH CONTRAST TECHNIQUE: Multidetector CT imaging of the neck was performed using the standard protocol following the bolus administration of intravenous contrast. CONTRAST:  769mISOVUE-300 IOPAMIDOL (ISOVUE-300) INJECTION 61% COMPARISON:  None.  FINDINGS: Pharynx and larynx: --Nasopharynx: Fossae of Rosenmuller are clear. Normal adenoid tonsils for age. --Oral cavity and oropharynx: The palatine and lingual tonsils are normal. The visible oral cavity and floor of mouth are normal. --Hypopharynx: Normal vallecula and pyriform sinuses. --Larynx: Normal epiglottis and pre-epiglottic space. Normal aryepiglottic and vocal folds. --Retropharyngeal space: No abscess, effusion or lymphadenopathy. Salivary glands: --Parotid: No mass lesion or inflammation. No sialolithiasis or ductal dilatation. --Submandibular: Reactive enlargement of left submandibular gland. Normal right submandibular gland. No sialolithiasis. --Sublingual: Normal. No ranula or other visible lesion of the base of tongue and floor of mouth. Thyroid: Normal. Lymph nodes: There are bilateral enlarged lymph nodes, predominantly within the left  cervical chain. Left level II a nodes measure up to 13 mm. There are numerous subcentimeter bilateral level 5A and 5B nodes. There is a large amount of soft tissue swelling of the left neck with inflammation of the left sternocleidomastoid muscle and inflammatory induration of the adjacent subcutaneous fat. There is thickening of the left aspect of the platysma. Inflammation extends into the left carotid and submandibular spaces. Vascular: Major cervical vessels are patent. Limited intracranial: Normal. Visualized orbits: Normal. Mastoids and visualized paranasal sinuses: No fluid levels or advanced mucosal thickening. No mastoid effusion. Skeleton: Soft tissue thickening at the right sternoclavicular joint is compatible with reported septic arthritis. Upper chest: Clear. Other: None. IMPRESSION: 1. Left sternocleidomastoid myositis with cellulitis of the adjacent subcutaneous fat and probable reactive thickening of the left platysma muscle. Inflammatory change also extends into the left submandibular and left carotid spaces. The origin of the process is not clear. If there has been no injection or penetrating injury in this area, then hematogenous spread of infection would be an alternative possibility. 2. No abscess or drainable fluid collection. 3. Reactive cervical lymphadenopathy, left worse than right, and reactive edema of the left submandibular gland. 4. Extension of inflammation to the left carotid space, but no vascular occlusion. 5. Soft tissue thickening at the right sternoclavicular joint, compatible with reported septic arthritis. Electronically Signed   By: Kevin  Herman M.D.   On: 03/20/2017 19:35   Ct Angio Chest Pe W Or Wo Contrast  Result Date: 02/25/2017 CLINICAL DATA:  Acute onset of pleuritic chest pain and shortness of breath. Elevated D-dimer. EXAM: CT ANGIOGRAPHY CHEST WITH CONTRAST TECHNIQUE: Multidetector CT imaging of the chest was performed using the standard protocol during bolus  administration of intravenous contrast. Multiplanar CT image reconstructions and MIPs were obtained to evaluate the vascular anatomy. CONTRAST:  100mL ISOVUE-370 IOPAMIDOL (ISOVUE-370) INJECTION 76% COMPARISON:  Chest radiograph performed 02/23/2016, and CT of the chest performed 02/20/2017 FINDINGS: Cardiovascular:  There is no evidence of pulmonary embolus. The heart is borderline enlarged. The thoracic aorta is grossly unremarkable in appearance. The great vessels are within normal limits. Mediastinum/Nodes: A 1.5 cm azygoesophageal recess node is noted. Visualized hilar nodes are borderline normal in size. No pericardial effusion is identified. Incidental note is made of a retroesophageal right subclavian artery. The visualized portions of the thyroid gland are unremarkable. No axillary lymphadenopathy is appreciated. Lungs/Pleura: Patchy bilateral airspace opacities raise concern for pneumonia, most prominent at the lower lobes bilaterally. Scattered peripheral nodular opacities are noted throughout both lungs. This is slightly improved from the prior study. There is a 2.9 cm cavitary focus at the right lung base, concerning for atypical infection. No pleural effusion or pneumothorax is seen. Upper Abdomen: The visualized portions of the liver and spleen are unremarkable. Musculoskeletal: No acute osseous abnormalities are   identified. The visualized musculature is unremarkable in appearance. Review of the MIP images confirms the above findings. IMPRESSION: 1. No evidence of pulmonary embolus. 2. As on the recent prior CT, bilateral pneumonia is again noted. Peripheral nodular opacities are seen throughout both lungs, slightly improved from the prior study. 2.9 cm cavitary focus at the right lung base is better characterized, and is concerning for atypical infection. 3. Enlarged 1.5 cm azygoesophageal recess node. Electronically Signed   By: Jeffery  Chang M.D.   On: 02/25/2017 04:52   Mr Sternum Wo  Contrast  Result Date: 02/27/2017 CLINICAL DATA:  Right sternoclavicular septic arthritis. Pain with active motion. Weakness. EXAM: MRI sternum without contrast TECHNIQUE: Multiplanar, multiecho pulse sequences of the sternum without intravenous contrast. CONTRAST:  None COMPARISON:  02/25/2017 CT chest FINDINGS: Patient could not fully tolerate the exam. Coronal T2 fat saturated and postcontrast imaging was not possible. Trace intra-articular and periarticular edema about the right sternoclavicular joint is noted. Reactive marrow changes are seen about the joint. Findings are compatible with history of septic arthritis. No significant drainable fluid collections are noted. No soft tissue mass is seen. IMPRESSION: Reactive marrow changes about the right sternoclavicular joint with trace intra-articular and periarticular fluid/edema. Findings are in keeping with history of septic arthritis. The contralateral sternoclavicular joint is unremarkable. No marrow signal changes to suggest acute osteomyelitis. Electronically Signed   By: David  Kwon M.D.   On: 02/27/2017 22:08   Mr Shoulder Right W Wo Contrast  Result Date: 02/27/2017 CLINICAL DATA:  31-year-old female with right sternoclavicular septic arthritis EXAM: MRI OF THE RIGHT SHOULDER WITHOUT AND WITH CONTRAST TECHNIQUE: Multiplanar, multisequence MR imaging of the right shoulder was performed before and after the administration of intravenous contrast. CONTRAST:  15mL MULTIHANCE GADOBENATE DIMEGLUMINE 529 MG/ML IV SOLN COMPARISON:  None. FINDINGS: Rotator cuff: Intact supraspinatus, infraspinatus, teres minor and subscapularis tendons. Muscles: No atrophy or abnormal signal of the muscles of the rotator cuff. Biceps long head:  Intact. Acromioclavicular Joint: Normal acromioclavicular joint. Type II curved acromion. Trace subacromial and subdeltoid bursal fluid and/or edema. Glenohumeral Joint: No joint effusion. No chondral defect. Labrum:  Intact. Bones:  Small subcortical degenerative cystic change along the posterolateral aspect of the humeral head. Marrow edema or fracture. No joint dislocation. Other: None. IMPRESSION: 1. Minimal subacromial bursitis. 2. No rotator cuff tear, marrow signal abnormality, fracture or joint dislocation. 3. Small focus of subcortical degenerate cystic change along the posterior aspect of the humeral head. Electronically Signed   By: David  Kwon M.D.   On: 02/27/2017 22:04   Dg Chest Port 1 View  Result Date: 03/19/2017 CLINICAL DATA:  Shortness of breath and leg pain EXAM: PORTABLE CHEST 1 VIEW COMPARISON:  02/22/2017 FINDINGS: Cardiac shadow is within normal limits. Right-sided PICC line is now seen in satisfactory position. The lungs are well aerated bilaterally without focal infiltrate or sizable effusion. No bony abnormality is noted. IMPRESSION: No acute abnormality noted. Electronically Signed   By: Mark  Lukens M.D.   On: 03/19/2017 17:42   Ct Bone Marrow Biopsy & Aspiration  Result Date: 03/24/2017 CLINICAL DATA:  Pancytopenia and neutropenia. MSSA bacteremia. History of IV drug abuse, bacterial endocarditis and right sternoclavicular septic arthritis. EXAM: CT GUIDED BONE MARROW ASPIRATION AND BIOPSY ANESTHESIA/SEDATION: Versed 4.0 mg IV, Fentanyl 150 mcg IV Total Moderate Sedation Time:   25 minutes. The patient's level of consciousness and physiologic status were continuously monitored during the procedure by Radiology nursing. PROCEDURE: The procedure risks, benefits, and alternatives   were explained to the patient's mother. Questions regarding the procedure were encouraged and answered. The patient's mother understands and consents to the procedure. A time out was performed prior to initiating the procedure. The right gluteal region was prepped with chlorhexidine. Sterile gown and sterile gloves were used for the procedure. Local anesthesia was provided with 1% Lidocaine. Under CT guidance, an 11 gauge On Control  bone cutting needle was advanced from a posterior approach into the right iliac bone. Needle positioning was confirmed with CT. Initial non heparinized and heparinized aspirate samples were obtained of bone marrow. Core biopsy was performed via the On Control drill needle. Two separate core biopsy samples were obtained. COMPLICATIONS: None FINDINGS: Inspection of initial aspirate did reveal visible particles. Intact core biopsy samples were obtained. A second core biopsy sample was obtained and placed in a sterile container in the event that culture analysis is necessary. IMPRESSION: CT guided bone marrow biopsy of right posterior iliac bone with both aspirate and core samples obtained. Electronically Signed   By: Aletta Edouard M.D.   On: 03/24/2017 11:59   [place] Ekg Site Rite  Result Date: 03/21/2017 If Site Rite image not attached, placement could not be confirmed due to current cardiac rhythm.   Time Spent in minutes  35   Mylo Driskill M.D on 03/26/2017 at 10:52 AM  Between 7am to 7pm - Pager - [phone]  After 7pm go to [email or web address] - password [email or web address]  [facility] -  Office  [phone]

## 2017-03-26 NOTE — Progress Notes (Signed)
  Speech Language Pathology Treatment: Dysphagia  Patient Details Name: [name] MRN: [id number] DOB: 12/19/1985 Today's Date: 03/26/2017 Time: 0915-0930 SLP Time Calculation (min) (ACUTE ONLY): 15 min  Assessment / Plan / Recommendation Clinical Impression  CT neck 3/13- no drainable fluid collections. There is minimal fluid in the retropharyngeal space. There is some right-ward shift of the larynx from the lateral pharyngeal wall edema (but no abscess), as noted on TFL scope examination yesterday evening. Airway remains patent. No swelling of epiglottis or AE folds, or at the level of the glottis.    Per my conversation with MD ([name]) CT scan of the neck obtained was reviewed and "showed worsening edema."  Pt with increased alertness and was appropriate in her interactions, able to follow directions and provide information about hospitalization. Pt with periods of voicing but is largely aphonic and has audible wetness for which pt is not aware of. Pt is able to cough and reswallow to clear wetness. Trials of ice chips and thin water yield increased wetness and coughing. No clear choice of appropriate diet or way to administer meds during bedside treatment. Requesting MBS. Made MD aware of POC for MBS at 1030 this day. He was agreeable.     HPI HPI: Pt is a 32 y.o. female, with past medical history significant for fibromyalgia, anemia, GERD, and history of IV drug abuse. She presented to the ED with a few days history of fever, chills, and generalized weakness muscle aches in addition to arthralgias.  She was admitted with diagnosis of sepsis.  Pt being treated for endocarditis. On 3/6 she developed erythema of her upper chest/L neck. CT showed myositis of L sternocleidomastoid w/ local cellulitis/ inflammation, but no abscess noted. Pt started to have difficulty swallowing and managing her secretions, so SLP and ENT were consulted on 3/12.      SLP Plan  Continue with current plan of  care       Recommendations  Diet recommendations: NPO(sips with meds and ice chips after oral care) Liquids provided via: Teaspoon;Cup Medication Administration: Whole meds with liquid Supervision: Patient able to self feed Compensations: Hard cough after swallow Postural Changes and/or Swallow Maneuvers: Seated upright 90 degrees                Oral Care Recommendations: Oral care QID;Oral care prior to ice chip/H20 Follow up Recommendations: (TBD) SLP Visit Diagnosis: Dysphagia, oropharyngeal phase (R13.12) Plan: Continue with current plan of care       GO                [name] 03/26/2017, 9:37 AM

## 2017-03-26 NOTE — Progress Notes (Signed)
Modified Barium Swallow Progress Note  Patient Details  Name: [name] MRN: [id number] Date of Birth: 12/25/1985  Today's Date: 03/26/2017  Modified Barium Swallow completed.  Full report located under Chart Review in the Imaging Section.  Brief recommendations include the following:  Clinical Impression  Pt presents at high risk for aspiration d/t pharyngeal edema that provides glottal closure. Incomplete glottal closure results in penetration during swallow and eventual aspiration of thin liquids and nectar thick liquids (via tsp, cup sips, with head turn to left/right and chin tuck). Pt with moderate residue in vallecula, posterior pharyngeal wall and pyriform sinuses when consuming thin liquids and nectar thick liquids. Despite double swallow, pt with anterior and posterior sensed aspiration of residue. With trials of honey thick liquid by cup sips and puree the bolus remained more cohesive as it transcended through pharynx. As a result, pt with no penetration during swallow and decreased pharyngeal residue. Double swallow was effective in clearing residue from puree and honey thick liquids. As a result, recommend dysphagia 1 with honey thick liquids, medicine crushed in puree. Please provide oral care prior to PO intake.     Swallow Evaluation Recommendations   Recommended Consults: Consider ENT evaluation   SLP Diet Recommendations: Dysphagia 1 (Puree) solids;Honey thick liquids   Liquid Administration via: Cup;Spoon   Medication Administration: Crushed with puree   Supervision: Patient able to self feed;Intermittent supervision to cue for compensatory strategies   Compensations: (double swallow with each bolus of puree or honey)   Postural Changes: Seated upright at 90 degrees;Remain semi-upright after after feeds/meals (Comment)   Oral Care Recommendations: Oral care BID;Oral care before and after PO   Other Recommendations: Have oral suction available;Remove water  pitcher;Prohibited food (jello, ice cream, thin soups);Order thickener from pharmacy    Yulanda Diggs 03/26/2017,12:05 PM

## 2017-03-27 ENCOUNTER — Inpatient Hospital Stay (HOSPITAL_COMMUNITY): Payer: Medicaid Other

## 2017-03-27 DIAGNOSIS — R590 Localized enlarged lymph nodes: Secondary | ICD-10-CM

## 2017-03-27 DIAGNOSIS — M609 Myositis, unspecified: Secondary | ICD-10-CM

## 2017-03-27 DIAGNOSIS — D61818 Other pancytopenia: Secondary | ICD-10-CM

## 2017-03-27 LAB — CBC
HCT: 23.3 % — ABNORMAL LOW (ref 36.0–46.0)
HEMOGLOBIN: 7.9 g/dL — AB (ref 12.0–15.0)
MCH: 29.2 pg (ref 26.0–34.0)
MCHC: 33.9 g/dL (ref 30.0–36.0)
MCV: 86 fL (ref 78.0–100.0)
Platelets: 251 10*3/uL (ref 150–400)
RBC: 2.71 MIL/uL — ABNORMAL LOW (ref 3.87–5.11)
RDW: 18.3 % — AB (ref 11.5–15.5)
WBC: 7 10*3/uL (ref 4.0–10.5)

## 2017-03-27 LAB — BASIC METABOLIC PANEL
Anion gap: 11 (ref 5–15)
BUN: 22 mg/dL — ABNORMAL HIGH (ref 6–20)
CALCIUM: 8.7 mg/dL — AB (ref 8.9–10.3)
CHLORIDE: 101 mmol/L (ref 101–111)
CO2: 23 mmol/L (ref 22–32)
CREATININE: 0.72 mg/dL (ref 0.44–1.00)
Glucose, Bld: 177 mg/dL — ABNORMAL HIGH (ref 65–99)
Potassium: 3.3 mmol/L — ABNORMAL LOW (ref 3.5–5.1)
SODIUM: 135 mmol/L (ref 135–145)

## 2017-03-27 LAB — CULTURE, BLOOD (ROUTINE X 2)
Culture: NO GROWTH
Culture: NO GROWTH
Special Requests: ADEQUATE
Special Requests: ADEQUATE

## 2017-03-27 MED ORDER — ALPRAZOLAM 0.5 MG PO TABS
0.5000 mg | ORAL_TABLET | Freq: Two times a day (BID) | ORAL | Status: DC | PRN
  Administered 2017-03-27 – 2017-04-15 (×33): 0.5 mg via ORAL
  Filled 2017-03-27 (×33): qty 1

## 2017-03-27 MED ORDER — ZOLPIDEM TARTRATE 5 MG PO TABS
5.0000 mg | ORAL_TABLET | Freq: Every evening | ORAL | Status: DC | PRN
  Administered 2017-03-27 – 2017-04-14 (×16): 5 mg via ORAL
  Filled 2017-03-27 (×16): qty 1

## 2017-03-27 MED ORDER — POTASSIUM CHLORIDE 20 MEQ/15ML (10%) PO SOLN
40.0000 meq | Freq: Every day | ORAL | Status: DC
  Administered 2017-03-27: 40 meq via ORAL
  Filled 2017-03-27: qty 30

## 2017-03-27 NOTE — Progress Notes (Signed)
[facility] for Infectious Disease  Date of Admission:  02/13/2017             ASSESSMENT/PLAN  MSSA Bacteremia / Endocarditis - Continues to remain afebrile with improving leukocytes. Of note she was given steroids which may be assisting. Recommend continuing Cefazolin until originally scheduled date of 4/2.   Neck Myositis and Cellulitis - gradually improving. There does not appear to be a clear indication for vancomycin at this time. Improvements likely related to steroids. Would continue to monitor off vancomycin and if symptoms worsen can restart.     Principal Problem:   MSSA bacteremia Active Problems:   Sepsis affecting skin ([place])   Cellulitis of left hand   Staphylococcus aureus bacteremia with sepsis (H[facility])   Weakness of both lower extremities   Pain of right clavicle   IVDU (intravenous drug user)   Septic arthritis of right sternoclavicular joint (H[facility])   PFO (patent foramen ovale)   Tricuspid valve vegetation   Chronic diastolic CHF (congestive heart failure) (HCC)   Blood per rectum   Hematuria   Infectious myositis L neck   Pancytopenia ([place])   . buprenorphine-naloxone  2 tablet Sublingual Daily  . DULoxetine  60 mg Oral BID  . enoxaparin (LOVENOX) injection  40 mg Subcutaneous Q24H  . ferrous sulfate  325 mg Oral TID WC  . gadobenate dimeglumine  15 mL Intravenous Once  . multivitamin with minerals  1 tablet Oral Daily  . nicotine  7 mg Transdermal Q24H  . OLANZapine  5 mg Oral q morning - 10a  . pantoprazole (PROTONIX) IV  40 mg Intravenous Q12H  . polyethylene glycol  17 g Oral Daily  . potassium chloride  40 mEq Oral Daily  . senna-docusate  1 tablet Oral BID    SUBJECTIVE:  Afebrile overnight with leukocytes continuing to improve now increased to 7. She was evaluated by ENT and found to have left neck myositis and cellulitis in the setting of IVDU. Remains with plan to continue IV antibiotics until 4/2. Vancomycin was added to broaden  coverage. Bone marrow biopsy with no blastic population identified.   Allergies  Allergen Reactions  . Azithromycin Nausea And Vomiting    ABDOMINAL PAIN  . Nsaids     UNSPECIFIED REACTION    . Hydrocodone Nausea And Vomiting  . Penicillins Nausea And Vomiting     Has patient had a PCN reaction causing immediate rash, facial/tongue/throat swelling, SOB or lightheadedness with hypotension: No Has patient had a PCN reaction causing severe rash involving mucus membranes or skin necrosis: No Has patient had a PCN reaction that required hospitalization No Has patient had a PCN reaction occurring within the last 10 years: No If all of the above answers are "NO", then may proceed with Cephalosporin use.   . Prednisone Nausea Only and Other (See Comments)    Can take shot, but pill form "caused stomach pain , nausea"     Review of Systems: Review of Systems  Constitutional: Negative for chills and fever.  HENT:       Positive for voice change  Respiratory: Negative for cough, sputum production, shortness of breath and wheezing.   Cardiovascular: Negative for chest pain and leg swelling.  Skin: Negative for rash.  Neurological: Negative for focal weakness and headaches.      OBJECTIVE: Vitals:   03/27/17 0143 03/27/17 0325 03/27/17 0437 03/27/17 0726  BP: 117/87  98/70 103/80  Pulse: (!) 53   (!) 49  Resp: 17   13  Temp: 97.9 F (36.6 C)  97.8 F (36.6 C)   TempSrc: Oral  Oral   SpO2: 97%   95%  Weight:  170 lb 3.1 oz (77.2 kg)    Height:       Body mass index is 26.66 kg/m.  Physical Exam  Neck: Neck supple.  Cardiovascular: Normal rate, regular rhythm and intact distal pulses. Exam reveals no gallop and no friction rub.  Murmur heard. PICC line in place, patent and clean/intact.   Pulmonary/Chest: Effort normal and breath sounds normal. No respiratory distress. She has no wheezes. She has no rales. She exhibits no tenderness.  Abdominal: Soft. Bowel sounds are  normal. She exhibits no distension.  Lymphadenopathy:    She has cervical adenopathy.    Lab Results Lab Results  Component Value Date   WBC 7.0 03/27/2017   HGB 7.9 (L) 03/27/2017   HCT 23.3 (L) 03/27/2017   MCV 86.0 03/27/2017   PLT 251 03/27/2017    Lab Results  Component Value Date   CREATININE 0.72 03/27/2017   BUN 22 (H) 03/27/2017   NA 135 03/27/2017   K 3.3 (L) 03/27/2017   CL 101 03/27/2017   CO2 23 03/27/2017    Lab Results  Component Value Date   ALT 9 (L) 03/22/2017   AST 11 (L) 03/22/2017   ALKPHOS 69 03/22/2017   BILITOT 2.0 (H) 03/22/2017     Microbiology: Recent Results (from the past 240 hour(s))  Culture, blood (Routine X 2) w Reflex to ID Panel     Status: None   Collection Time: 03/19/17 10:40 AM  Result Value Ref Range Status   Specimen Description BLOOD LEFT HAND  Final   Special Requests IN PEDIATRIC BOTTLE Blood Culture adequate volume  Final   Culture   Final    NO GROWTH 5 DAYS Performed at [facility], [place] [address], [place], [place] [address]    Report Status 03/24/2017 FINAL  Final  Culture, blood (Routine X 2) w Reflex to ID Panel     Status: None   Collection Time: 03/19/17 10:50 AM  Result Value Ref Range Status   Specimen Description BLOOD LEFT ARM  Final   Special Requests IN PEDIATRIC BOTTLE Blood Culture adequate volume  Final   Culture   Final    NO GROWTH 5 DAYS Performed at [facility], [place] [address] [place], [place] [address]    Report Status 03/24/2017 FINAL  Final  Rapid Strep Screen (Not at [facility])     Status: None   Collection Time: 03/20/17  8:20 AM  Result Value Ref Range Status   Streptococcus, Group A Screen (Direct) NEGATIVE NEGATIVE Final    Comment: (NOTE) A Rapid Antigen test may result negative if the antigen level in the sample is below the detection level of this test. The FDA has not cleared this test as a stand-alone test therefore the rapid antigen negative result has reflexed to a  Group A Strep culture.   Culture, group A strep     Status: None   Collection Time: 03/20/17  8:20 AM  Result Value Ref Range Status   Specimen Description THROAT  Final   Special Requests NONE Reflexed from [id number]  Final   Culture   Final    NO GROUP A STREP (S.PYOGENES) ISOLATED Performed at [facility], [address], [place], [place] [address]    Report Status 03/22/2017 FINAL  Final  Culture, blood (routine x 2)     Status: None (Preliminary result)   Collection Time: 03/21/17 11:15 PM  Result Value Ref Range Status   Specimen Description BLOOD RIGHT HAND  Final   Special Requests IN PEDIATRIC BOTTLE Blood Culture adequate volume  Final   Culture   Final    NO GROWTH 4 DAYS Performed at [facility], [place] [address], [place], [place] [address]    Report Status PENDING  Incomplete  Culture, blood (routine x 2)     Status: None (Preliminary result)   Collection Time: 03/21/17 11:25 PM  Result Value Ref Range Status   Specimen Description BLOOD RIGHT HAND  Final   Special Requests IN PEDIATRIC BOTTLE Blood Culture adequate volume  Final   Culture   Final    NO GROWTH 4 DAYS Performed at [facility], [place] [address], [place], [place] [address]    Report Status PENDING  Incomplete     Terri Piedra, [place] [facility] [employer] [phone] Pager  03/27/2017  12:22 PM

## 2017-03-27 NOTE — Progress Notes (Signed)
ENT Consult Progress Note  Subjective:  Afebrile. Feels neck is improving. Can swallow and tolerate her secretions better now.   Objective: Vitals:   03/27/17 0143 03/27/17 0437  BP: 117/87 98/70  Pulse: (!) 53   Resp: 17   Temp: 97.9 F (36.6 C) 97.8 F (36.6 C)  SpO2: 97%     Physical Exam: CONSTITUTIONAL: well developed, nourished, no distress and alert and oriented x 3 CARDIOVASCULAR: normal rate and regular rhythm PULMONARY/CHEST WALL: effort normal and no stridor, no stertor, no dysphonia HENT: Head : normocephalic and atraumatic Ears: Right ear:   canal normal, external ear normal and hearing normal Left ear:   canal normal, external ear normal and hearing normal Nose: nose normal and no purulence Mouth/Throat:  Mouth: uvula midline and no oral lesions Throat: oropharynx clear and moist Mucous membranes: normal, no thickened secretions in the oropharynx NECK: supple, less edema and erythema today. Full ROM. Still firm in level 2, but no fluctuance. Firmness is improving   Data Review/Consults/Discussions: Reviewed Internal Medicine notes- broadened IV abx coverage.   Reviewed SLP notes- dysphagia diet approved, which is an improvement from NPO status recommended prior  WBC improving (now 7 from 1.9 two days ago)    Assessment: [name] 32 y.o. female who presents with left neck myositis and cellulitis int he setting of IVDA. Improving clinical examination on broadened IV antibiotic coverage. Recent CT shows no drainable fluid collections. Airway remains patent with improvement in patient's ability to tolerate secretions. No surgeries are necessary at this time   Plan:  -Please re-engage ENT with any concerns or questions   [facility] in the care of this patient. Should you need further assistance, please call our office at [phone].    Tammy StanleyAmanda Jo Marcellino, MD

## 2017-03-27 NOTE — Progress Notes (Signed)
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  Patient was not felt to be a candidate for valve replacement at this time due to her ongoing drug use.  She was also noted to have right sternoclavicular septic arthritis.  She was initially maintained on IV nafcillin to complete a total of 6-8 weeks of IV antibiotics as she was not safe to discharge home with PICC line and no facility was willing to accept the patient.  Her course has been complicated by an episode on 03/19/2017 of sepsis physiology with worsening pancytopenia and neutropenia for which hematology was consulted and is following as well as worsening cellulitis and myositis of the left neck and shoulder area.  With her progressive anemia, prior history of GI bleed, and reports of small amounts of blood in her stool and dark stools GI was consulted but felt that patient was not having lower GI bleed.  Evaluated by ENT and brought on antibiotics.  Assessment & Plan:   # MSSA tricuspid valve endocarditis/MSSA bacteremia: -Patient was initially treated on IV nafcillin.  This is discontinued because of suspected cause of bone marrow suppression.  The antibiotics changed to cefepime for 3 days and then to Ancef.  Now counts are improving.  Evaluated by ENT for left neck myositis and cellulitis in the setting of IV drug abuse.  I recommend to broaden antibiotics.  Added vancomycin IV on 3/13.  Recent CT scan showed no drainable fluid collection.  Airway remained patent with improvement in elevated to tolerate oral intake.  No surgery needed. -As per infectious disease note from 3/11:  Plan to continue antibiotics till April 2.  I have discussed with Dr. Linus Salmons today regarding broaden antibiotics with IV vancomycin.  Patient will be reevaluated.  #Left neck cellulitis: No drainable fluid collection on repeat CT scan.  ENT consult appreciated.  Completed 3 doses of IV Decadron and currently on empiric IV vancomycin.  Discussed with ID today.  Swallow evaluation.  #Chronic pain management, anxiety depression: Continue Cymbalta, oxycodone and Zyprexa.  #Pancytopenia due to bone marrow suppression in the setting of sepsis/endocarditis and possibly with IV nafcillin.  GI was consulted who thought this was not in the setting of active GI bleed.  Bone marrow biopsy suggested secondary changes possibly due to medication.  Hematology consult appreciated.  Monitor CBC.  #Hypokalemia: Start oral potassium chloride.  Check magnesium level and repeat BMP.  Encourage oral intake.  #PICC line placed on 03/21/2017  #History of IV drug abuse currently on Suboxone.  DVT prophylaxis: Lovenox subcutaneous Code Status: Full code Family Communication: No family at bedside Disposition Plan: Likely discharge home after completion of IV antibiotics    Consultants:   ENT  GI  Hematology  Procedures: CT neck, bone marrow biopsy, TEE Antimicrobials: IV Ancef 2/-2/8 Zyvox 2/5-2/6, 2/17-/18 IV nafcillin 2/8-3/6 IV cefepime 3/6-3/9 IV cefazolin 3/9 - IV vancomycin 3/13-   Subjective: Seen and examined at bedside.  Has weakness.  No shortness of breath.  Able to swallow.  No chest pain or shortness of breath.  Objective: Vitals:   03/27/17 0143 03/27/17 0325 03/27/17 0437 03/27/17 0726  BP: 117/87  98/70  103/80  Pulse: (!) 53   (!) 49  Resp: 17   13  Temp: 97.9 F (36.6 C)  97.8 F (36.6 C)   TempSrc: Oral  Oral   SpO2: 97%   95%  Weight:  77.2 kg (170 lb 3.1 oz)    Height:        Intake/Output Summary (Last 24 hours) at 03/27/2017 1101 Last data filed at 03/27/2017  0400 Gross per 24 hour  Intake 2441.67 ml  Output -  Net 2441.67 ml   Filed Weights   03/25/17 0638 03/26/17 0500 03/27/17 0325  Weight: 76.1 kg (167 lb 12.3 oz) 76.2 kg (167 lb 15.9 oz) 77.2 kg (170 lb 3.1 oz)    Examination:  General exam: Appears calm and comfortable  Neck: Mild swelling on the left neck Respiratory system: Clear to auscultation. Respiratory effort normal. No wheezing or crackle Cardiovascular system: S1 & S2 heard, RRR.  No pedal edema. Gastrointestinal system: Abdomen is nondistended, soft and nontender. Normal bowel sounds heard. Central nervous system: Alert and oriented. No focal neurological deficits. Skin: No rashes, lesions or ulcers Psychiatry: Judgement and insight appear normal. Mood & affect appropriate.     Data Reviewed: I have personally reviewed following labs and imaging studies  CBC: Recent Labs  Lab 03/21/17 0411  03/22/17 0224  03/23/17 0737 03/24/17 1157 03/25/17 0309 03/26/17 0801 03/27/17 0415  WBC 0.6*  --  0.7*   < > 0.5* 0.9* 1.9* 4.1 7.0  NEUTROABS 0.0*  --  0.0*  --  0.0*  --   --   --   --   HGB 6.4*   < > 7.4*   < > 7.4* 7.8* 8.2* 8.3* 7.9*  HCT 19.3*   < > 21.8*   < > 21.0* 23.0* 23.7* 23.7* 23.3*  MCV 87.7  --  86.5   < > 86.1 85.2 85.6 85.6 86.0  PLT 158  --  146*  147*   < > 171 212 249 250 251   < > = values in this interval not displayed.   Basic Metabolic Panel: Recent Labs  Lab 03/22/17 0224 03/23/17 0418 03/24/17 0449 03/26/17 0801 03/27/17 0415  NA 132* 132* 133* 137 135  K 3.5 3.2* 3.2* 2.7* 3.3*  CL 107 103 103 101 101  CO2 16* 19* 20* 24 23  GLUCOSE 107* 97 87 159* 177*  BUN _0 22*  CREATININE 0.82 0.78 0.75 0.69 0.72  CALCIUM 8.4* 8.8* 8.9 8.8* 8.7*  MG 1.6*  --   --  1.9  --    GFR: Estimated Creatinine Clearance: 108.1 mL/min (by C-G formula based on SCr of 0.72 mg/dL). Liver Function Tests: Recent Labs  Lab 03/22/17 0224  AST 11*  ALT 9*  ALKPHOS 69  BILITOT 2.0*  PROT  5.8*  ALBUMIN 1.8*   No results for input(s): LIPASE, AMYLASE in the last 168 hours. No results for input(s): AMMONIA in the last 168 hours. Coagulation Profile: Recent Labs  Lab 03/22/17 0224  INR 1.40   Cardiac Enzymes: No results for input(s): CKTOTAL, CKMB, CKMBINDEX, TROPONINI in the last 168 hours. BNP (last 3 results) No results for input(s): PROBNP in the last 8760 hours. HbA1C: No results for input(s): HGBA1C in the last 72 hours. CBG: No results for input(s): GLUCAP in the last 168 hours. Lipid Profile: No results for input(s): CHOL, HDL, LDLCALC, TRIG, CHOLHDL, LDLDIRECT in the last 72 hours. Thyroid Function Tests: No results for input(s):  TSH, T4TOTAL, FREET4, T3FREE, THYROIDAB in the last 72 hours. Anemia Panel: No results for input(s): VITAMINB12, FOLATE, FERRITIN, TIBC, IRON, RETICCTPCT in the last 72 hours. Sepsis Labs: No results for input(s): PROCALCITON, LATICACIDVEN in the last 168 hours.  Recent Results (from the past 240 hour(s))  Culture, blood (Routine X 2) w Reflex to ID Panel     Status: None   Collection Time: 03/19/17 10:40 AM  Result Value Ref Range Status   Specimen Description BLOOD LEFT HAND  Final   Special Requests IN PEDIATRIC BOTTLE Blood Culture adequate volume  Final   Culture   Final    NO GROWTH 5 DAYS Performed at [facility], [address], [place], [place] [address]    Report Status 03/24/2017 FINAL  Final  Culture, blood (Routine X 2) w Reflex to ID Panel     Status: None   Collection Time: 03/19/17 10:50 AM  Result Value Ref Range Status   Specimen Description BLOOD LEFT ARM  Final   Special Requests IN PEDIATRIC BOTTLE Blood Culture adequate volume  Final   Culture   Final    NO GROWTH 5 DAYS Performed at [facility], [place] [address] [place], [place] [address]    Report Status 03/24/2017 FINAL  Final  Rapid Strep Screen (Not at [facility])     Status: None   Collection Time: 03/20/17  8:20 AM  Result Value Ref  Range Status   Streptococcus, Group A Screen (Direct) NEGATIVE NEGATIVE Final    Comment: (NOTE) A Rapid Antigen test may result negative if the antigen level in the sample is below the detection level of this test. The FDA has not cleared this test as a stand-alone test therefore the rapid antigen negative result has reflexed to a Group A Strep culture.   Culture, group A strep     Status: None   Collection Time: 03/20/17  8:20 AM  Result Value Ref Range Status   Specimen Description THROAT  Final   Special Requests NONE Reflexed from [id number]  Final   Culture   Final    NO GROUP A STREP (S.PYOGENES) ISOLATED Performed at [facility], [address], [place], [place] [address]    Report Status 03/22/2017 FINAL  Final  Culture, blood (routine x 2)     Status: None (Preliminary result)   Collection Time: 03/21/17 11:15 PM  Result Value Ref Range Status   Specimen Description BLOOD RIGHT HAND  Final   Special Requests IN PEDIATRIC BOTTLE Blood Culture adequate volume  Final   Culture   Final    NO GROWTH 4 DAYS Performed at [facility], [place] [address] [place], [place] [address]    Report Status PENDING  Incomplete  Culture, blood (routine x 2)     Status: None (Preliminary result)   Collection Time: 03/21/17 11:25 PM  Result Value Ref Range Status   Specimen Description BLOOD RIGHT HAND  Final   Special Requests IN PEDIATRIC BOTTLE Blood Culture adequate volume  Final   Culture   Final    NO GROWTH 4 DAYS Performed at [facility], [place] [address] [place],  [address]    Report Status PENDING  Incomplete         Radiology Studies: Ct Soft Tissue Neck W Contrast  Result Date: 03/26/2017 CLINICAL DATA:  Left-sided neck pain and difficulty swallowing. History of MSSA bacteremia. EXAM: CT NECK WITH CONTRAST TECHNIQUE: Multidetector CT imaging  of the neck was performed using the standard protocol following the bolus administration of intravenous  contrast. CONTRAST:  59m ISOVUE-300 IOPAMIDOL (ISOVUE-300) INJECTION 61% COMPARISON:  CT neck 03/20/2017 FINDINGS: Pharynx and larynx: --Nasopharynx: Fossae of Rosenmuller are clear. Normal adenoid tonsils for age. --Oral cavity and oropharynx: Increased edema of the left palatine tonsil. No associated peritonsillar abscess. --Hypopharynx: Normal vallecula and pyriform sinuses. --Larynx: Normal epiglottis and pre-epiglottic space. Normal aryepiglottic and vocal folds. --Retropharyngeal space: No abscess or effusion. There is increased edema and/or lymphadenopathy of the retropharyngeal space at the level of the supraglottic larynx. Salivary glands: --Parotid: No mass lesion or inflammation. No sialolithiasis or ductal dilatation. --Submandibular: Unchanged reactive enlargement of the left submandibular gland. --Sublingual: Normal. No ranula or other visible lesion of the base of tongue and floor of mouth. Thyroid: Normal. Lymph nodes: Persistent multiple enlarged left cervical chain lymph nodes, measuring up to 14 mm. Left level 3 lymph nodes have increased in size, now measuring up to 12 mm, previously 8 mm. The degree of enhancement in the left-sided lymph nodes has diminished.Numerous subcentimeter right cervical lymph nodes. The right-sided adenopathy has slightly worsened. Vascular: Major cervical vessels are patent. There is increased mass effect on the left internal jugular vein, which remains widely patent. Limited intracranial: Normal. Visualized orbits: Normal. Mastoids and visualized paranasal sinuses: No fluid levels or advanced mucosal thickening. No mastoid effusion. Skeleton: Decreased soft tissue thickening at the right sternoclavicular joint. No acute osseous finding. Upper chest: Clear. Other: There is persistent inflammation of the left sternocleidomastoid with induration of the surrounding fat and thickening of the left platysma muscle. No abscess. There is increased edema within the left  parapharyngeal space with associated worsening of rightward mass effect on the hypopharynx and larynx. There is edema versus lymphadenopathy within the retropharyngeal space at the level of the supraglottic larynx, which is worsened from the prior study. IMPRESSION: 1. Worsened rightward mass effect on the hypopharynx and larynx secondary to worsened edema in the left parapharyngeal space and edema/lymphadenopathy in the retropharyngeal space. No retropharyngeal or peritonsillar abscess. 2. Unchanged appearance of left neck cellulitis and myositis, predominantly involving the sternocleidomastoid muscle. 3. Decreased enhancement of left cervical lymph nodes, with increased size of enlarged nodes at left level 3. Slightly worsened right cervical adenopathy. 4. Improved appearance of soft tissue thickening at the right sternoclavicular joint. Electronically Signed   By: KUlyses JarredM.D.   On: 03/26/2017 00:44   Dg Swallowing Func-speech Pathology  Result Date: 03/26/2017 Objective Swallowing Evaluation: Type of Study: MBS-Modified Barium Swallow Study  Patient Details Name: Tammy VENIERMRN: [id number] of Birth: 927-Aug-1987Today's Date: 03/26/2017 Time: SLP Start Time (ACUTE ONLY): 1030 -SLP Stop Time (ACUTE ONLY): 1045 SLP Time Calculation (min) (ACUTE ONLY): 15 min Past Medical History: Past Medical History: Diagnosis Date . Anemia, unspecified  . Anxiety  . Asthma  . Disc herniation   causes sciatica unsure which disc . Esophageal reflux  . Fatty liver  . Fibromyalgia  . Gastroparesis  . Headache(784.0)  . Heart murmur  . History of narcotic addiction ([place]  [place]:  Per pt, was addicted to narcotics; mom helped intervene and stopped taking opiods  . Hyperemesis gravidarum with metabolic disturbance, unspecified as to episode of care  . Irritable bowel syndrome  . MSSA bacteremia 02/20/2017 . Other and unspecified noninfectious gastroenteritis and colitis(558.9)  . Pregnant  . PTSD (post-traumatic  stress disorder)  . Septic arthritis of right sternoclavicular joint ([place] 02/20/2017 . Unspecified  asthma(493.90)  Past Surgical History: Past Surgical History: Procedure Laterality Date . APPENDECTOMY   . CHOLECYSTECTOMY   . COLONOSCOPY   . LAPAROSCOPIC BILATERAL SALPINGECTOMY Bilateral 12/08/2012  Procedure: LAPAROSCOPIC BILATERAL SALPINGECTOMY;  Surgeon: Jonnie Kind, MD;  Location: [facility];  Service: Gynecology;  Laterality: Bilateral; . MULTIPLE EXTRACTIONS WITH ALVEOLOPLASTY N/A 12/01/2015  Procedure: EXTRACTION TEETH TWO, THREE, FOUR, SIX, SEVEN, EIGHT, NINE, TEN, ELEVEN, TWELVE, FOURTEEN, FIFTEEN, TWENTY, TWENTY ONE, TWENTY EIGHT, TWENTY NINE, THIRTY AND THIRTY ONE WITH ALVEOLOPLASTY;  Surgeon: Diona Browner, DDS;  Location: [place];  Service: Oral Surgery;  Laterality: N/A; . TEE WITHOUT CARDIOVERSION N/A 02/20/2017  Procedure: TRANSESOPHAGEAL ECHOCARDIOGRAM (TEE);  Surgeon: Larey Dresser, MD;  Location: [facility];  Service: Cardiovascular;  Laterality: N/A; . TUBAL LIGATION   HPI: Pt is a 32 y.o. female, with past medical history significant for fibromyalgia, anemia, GERD, and history of IV drug abuse. She presented to the ED with a few days history of fever, chills, and generalized weakness muscle aches in addition to arthralgias.  She was admitted with diagnosis of sepsis.  Pt being treated for endocarditis. On 3/6 she developed erythema of her upper chest/L neck. CT showed myositis of L sternocleidomastoid w/ local cellulitis/ inflammation, but no abscess noted. Pt started to have difficulty swallowing and managing her secretions, so SLP and ENT were consulted on 3/12.  Subjective: pt pleasant, alert and conversant Assessment / Plan / Recommendation CHL IP CLINICAL IMPRESSIONS 03/26/2017 Clinical Impression Pt presents at high risk for aspiration d/t pharyngeal edema that provides glottal closure. Incomplete glottal closure results in penetration during swallow and eventual aspiration of thin liquids  and nectar thick liquids (via tsp, cup sips, with head turn to left/right and chin tuck). Pt with moderate residue in vallecula, posterior pharyngeal wall and pyriform sinuses when consuming thin liquids and nectar thick liquids. Despite double swallow, pt with anterior and posterior sensed aspiration of residue. With trials of honey thick liquid by cup sips and puree the bolus remained more cohesive as it transcended through pharynx. As a result, pt with no penetration during swallow and decreased pharyngeal residue. Double swallow was effective in clearing residue from puree and honey thick liquids. As a result, recommend dysphagia 1 with honey thick liquids, medicine crushed in puree. Please provide oral care prior to PO intake.   SLP Visit Diagnosis Dysphagia, pharyngeal phase (R13.13) Attention and concentration deficit following -- Frontal lobe and executive function deficit following -- Impact on safety and function Severe aspiration risk;Risk for inadequate nutrition/hydration   CHL IP TREATMENT RECOMMENDATION 03/26/2017 Treatment Recommendations Therapy as outlined in treatment plan below   Prognosis 03/26/2017 Prognosis for Safe Diet Advancement Fair Barriers to Reach Goals Cognitive deficits Barriers/Prognosis Comment -- CHL IP DIET RECOMMENDATION 03/26/2017 SLP Diet Recommendations Dysphagia 1 (Puree) solids;Honey thick liquids Liquid Administration via Cup;Spoon Medication Administration Crushed with puree Compensations (No Data) Postural Changes Seated upright at 90 degrees;Remain semi-upright after after feeds/meals (Comment)   CHL IP OTHER RECOMMENDATIONS 03/26/2017 Recommended Consults Consider ENT evaluation Oral Care Recommendations Oral care BID;Oral care before and after PO Other Recommendations Have oral suction available;Remove water pitcher;Prohibited food (jello, ice cream, thin soups);Order thickener from pharmacy   CHL IP FOLLOW UP RECOMMENDATIONS 03/26/2017 Follow up Recommendations (No Data)    CHL IP FREQUENCY AND DURATION 03/25/2017 Speech Therapy Frequency (ACUTE ONLY) min 3x week Treatment Duration 2 weeks      CHL IP ORAL PHASE 03/26/2017 Oral Phase WFL Oral - Pudding Teaspoon -- Oral -  Pudding Cup -- Oral - Honey Teaspoon -- Oral - Honey Cup -- Oral - Nectar Teaspoon -- Oral - Nectar Cup -- Oral - Nectar Straw -- Oral - Thin Teaspoon -- Oral - Thin Cup -- Oral - Thin Straw -- Oral - Puree -- Oral - Mech Soft -- Oral - Regular -- Oral - Multi-Consistency -- Oral - Pill -- Oral Phase - Comment --  CHL IP PHARYNGEAL PHASE 03/26/2017 Pharyngeal Phase Impaired Pharyngeal- Pudding Teaspoon -- Pharyngeal -- Pharyngeal- Pudding Cup -- Pharyngeal -- Pharyngeal- Honey Teaspoon Pharyngeal residue - valleculae;Pharyngeal residue - pyriform;Pharyngeal residue - posterior pharnyx;Reduced epiglottic inversion;Reduced airway/laryngeal closure Pharyngeal -- Pharyngeal- Honey Cup Reduced epiglottic inversion;Reduced airway/laryngeal closure;Pharyngeal residue - valleculae;Pharyngeal residue - pyriform;Pharyngeal residue - posterior pharnyx;Compensatory strategies attempted (with notebox) Pharyngeal -- Pharyngeal- Nectar Teaspoon Reduced epiglottic inversion;Reduced airway/laryngeal closure;Penetration/Apiration after swallow;Penetration/Aspiration during swallow;Moderate aspiration;Pharyngeal residue - valleculae;Pharyngeal residue - pyriform;Pharyngeal residue - posterior pharnyx Pharyngeal Material enters airway, passes BELOW cords and not ejected out despite cough attempt by patient Pharyngeal- Nectar Cup Reduced epiglottic inversion;Reduced airway/laryngeal closure;Moderate aspiration;Penetration/Aspiration during swallow;Penetration/Apiration after swallow;Pharyngeal residue - valleculae;Pharyngeal residue - pyriform;Pharyngeal residue - posterior pharnyx Pharyngeal Material enters airway, passes BELOW cords and not ejected out despite cough attempt by patient Pharyngeal- Nectar Straw -- Pharyngeal -- Pharyngeal-  Thin Teaspoon -- Pharyngeal -- Pharyngeal- Thin Cup Reduced epiglottic inversion;Reduced airway/laryngeal closure;Penetration/Aspiration during swallow;Penetration/Apiration after swallow;Moderate aspiration;Pharyngeal residue - valleculae;Pharyngeal residue - pyriform;Pharyngeal residue - posterior pharnyx Pharyngeal Material enters airway, passes BELOW cords and not ejected out despite cough attempt by patient Pharyngeal- Thin Straw -- Pharyngeal -- Pharyngeal- Puree Reduced epiglottic inversion;Reduced airway/laryngeal closure;Pharyngeal residue - valleculae;Pharyngeal residue - pyriform;Pharyngeal residue - posterior pharnyx Pharyngeal -- Pharyngeal- Mechanical Soft -- Pharyngeal -- Pharyngeal- Regular -- Pharyngeal -- Pharyngeal- Multi-consistency -- Pharyngeal -- Pharyngeal- Pill -- Pharyngeal -- Pharyngeal Comment --  CHL IP CERVICAL ESOPHAGEAL PHASE 03/26/2017 Cervical Esophageal Phase WFL Pudding Teaspoon -- Pudding Cup -- Honey Teaspoon -- Honey Cup -- Nectar Teaspoon -- Nectar Cup -- Nectar Straw -- Thin Teaspoon -- Thin Cup -- Thin Straw -- Puree -- Mechanical Soft -- Regular -- Multi-consistency -- Pill -- Cervical Esophageal Comment -- No flowsheet data found. Happi Overton 03/26/2017, 12:06 PM                   Scheduled Meds: . buprenorphine-naloxone  2 tablet Sublingual Daily  . DULoxetine  60 mg Oral BID  . enoxaparin (LOVENOX) injection  40 mg Subcutaneous Q24H  . ferrous sulfate  325 mg Oral TID WC  . gadobenate dimeglumine  15 mL Intravenous Once  . multivitamin with minerals  1 tablet Oral Daily  . nicotine  7 mg Transdermal Q24H  . OLANZapine  5 mg Oral q morning - 10a  . pantoprazole (PROTONIX) IV  40 mg Intravenous Q12H  . polyethylene glycol  17 g Oral Daily  . potassium chloride  40 mEq Oral Daily  . senna-docusate  1 tablet Oral BID   Continuous Infusions: . sodium chloride    .  ceFAZolin (ANCEF) IV Stopped (03/27/17 0741)  . dextrose 5 % and 0.45% NaCl 65 mL/hr at  03/26/17 0602  . vancomycin Stopped (03/27/17 0541)     LOS: 42 days    Sukanya Goldblatt Tanna Furry, MD [facility] Pager [phone]  If 7PM-7AM, please contact night-coverage [email or web address] Password [email or web address] 03/27/2017, 11:01 AM

## 2017-03-27 NOTE — Progress Notes (Signed)
Pt [name], HR ranges from mid 40s to low 50s, other VS stable, pt remains alert and oriented x 4, no signs of acute distress, pt asleep. Craige CottaKirby, NP notified.   Will continue to monitor pt.

## 2017-03-27 NOTE — Progress Notes (Signed)
  Speech Language Pathology Treatment: Dysphagia  Patient Details Name: Tammy ArabCasey M Byrnes MRN: [id number] DOB: 02/23/1985 Today's Date: 03/27/2017 Time: [id number] SLP Time Calculation (min) (ACUTE ONLY): 13 min  Assessment / Plan / Recommendation Clinical Impression  SLP provided f/u education regarding [facility] and rationale. SLP provided Min cues for use of double swallow to facilitate pharyngeal clearance and airway protection. Her swallow was audible but only one delayed throat clear was noted after consuming about 4 ounces of puree and 3 ounces of honey thick tea. Her voice is primarily aphonic today, but she did have one instance of audible phonation lasting ~1 second. Discussed plan with pt, including assessing for clinical improvement (subjectively improved swallowing, reduced s/s of aspiration with advanced textures, improving vocal quality) to determine readiness to repeat MBS. Her prognosis for advancement would be good with decreased pharyngeal edema.   HPI HPI: Pt is a 32 y.o. female, with past medical history significant for fibromyalgia, anemia, GERD, and history of IV drug abuse. She presented to the ED with a few days history of fever, chills, and generalized weakness muscle aches in addition to arthralgias.  She was admitted with diagnosis of sepsis.  Pt being treated for endocarditis. On 3/6 she developed erythema of her upper chest/L neck. CT showed myositis of L sternocleidomastoid w/ local cellulitis/ inflammation, but no abscess noted. Pt started to have difficulty swallowing and managing her secretions, so SLP and ENT were consulted on 3/12.      SLP Plan  Continue with current plan of care       Recommendations  Diet recommendations: Dysphagia 1 (puree);Honey-thick liquid Liquids provided via: Teaspoon;Cup Medication Administration: Crushed with puree Supervision: Patient able to self feed;Intermittent supervision to cue for compensatory  strategies Compensations: Slow rate;Small sips/bites;Multiple dry swallows after each bite/sip Postural Changes and/or Swallow Maneuvers: Seated upright 90 degrees                Oral Care Recommendations: Oral care BID Follow up Recommendations: (tba) SLP Visit Diagnosis: Dysphagia, pharyngeal phase (R13.13) Plan: Continue with current plan of care       GO                Maxcine Hamaiewonsky, Aulden Calise 03/27/2017, 3:45 PM  Maxcine HamLaura Paiewonsky, M.A. CCC-SLP [phone]

## 2017-03-28 DIAGNOSIS — M6008 Infective myositis, other site: Secondary | ICD-10-CM

## 2017-03-28 LAB — BASIC METABOLIC PANEL
Anion gap: 12 (ref 5–15)
BUN: 21 mg/dL — AB (ref 6–20)
CHLORIDE: 102 mmol/L (ref 101–111)
CO2: 23 mmol/L (ref 22–32)
CREATININE: 0.71 mg/dL (ref 0.44–1.00)
Calcium: 8.8 mg/dL — ABNORMAL LOW (ref 8.9–10.3)
GFR calc non Af Amer: >60 mL/min (ref >60)
Glucose, Bld: 149 mg/dL — ABNORMAL HIGH (ref 65–99)
POTASSIUM: 3 mmol/L — AB (ref 3.5–5.1)
SODIUM: 137 mmol/L (ref 135–145)

## 2017-03-28 LAB — MAGNESIUM: MAGNESIUM: 2.1 mg/dL (ref 1.7–2.4)

## 2017-03-28 MED ORDER — POTASSIUM CHLORIDE 10 MEQ/100ML IV SOLN
10.0000 meq | INTRAVENOUS | Status: AC
  Administered 2017-03-28 (×2): 10 meq via INTRAVENOUS
  Filled 2017-03-28 (×2): qty 100

## 2017-03-28 MED ORDER — POTASSIUM CHLORIDE 20 MEQ/15ML (10%) PO SOLN
40.0000 meq | Freq: Two times a day (BID) | ORAL | Status: DC
  Administered 2017-03-28 – 2017-03-29 (×3): 40 meq via ORAL
  Filled 2017-03-28 (×24): qty 30

## 2017-03-28 MED ORDER — PANTOPRAZOLE SODIUM 40 MG PO TBEC
40.0000 mg | DELAYED_RELEASE_TABLET | Freq: Two times a day (BID) | ORAL | Status: DC
  Administered 2017-03-28 – 2017-04-15 (×36): 40 mg via ORAL
  Filled 2017-03-28 (×36): qty 1

## 2017-03-28 NOTE — Progress Notes (Signed)
Per IV team, PICC dressing change will not be due until 3/17 since PICC was just placed 3/12. Dressing still intact.

## 2017-03-28 NOTE — Progress Notes (Addendum)
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  Patient was not felt to be a candidate for valve replacement at this time due to her ongoing drug use.  She was also noted to have right sternoclavicular septic arthritis.  She was initially maintained on IV nafcillin to complete a total of 6-8 weeks of IV antibiotics as she was not safe to discharge home with PICC line and no facility was willing to accept the patient.  Her course has been complicated by an episode on 03/19/2017 of sepsis physiology with worsening pancytopenia and neutropenia for which hematology was consulted and is following as well as worsening cellulitis and myositis of the left neck and shoulder area.  With her progressive anemia, prior history of GI bleed, and reports of small amounts of blood in her stool and dark stools GI was consulted but felt that patient was not having lower GI bleed.  Evaluated by ENT and brought on antibiotics.  Assessment & Plan:   # MSSA tricuspid valve endocarditis/MSSA bacteremia: -Patient was initially treated on IV nafcillin.  This is discontinued because of suspected cause of bone marrow suppression.  The antibiotics changed to cefepime for 3 days and then to Ancef.  Now counts are improving.  Evaluated by ENT for left neck myositis and cellulitis in the setting of IV drug abuse. ENT recommend to broaden antibiotics.  Added vancomycin IV on 3/13. Re-evaluated by ID Dr. Linus Salmons and discontinue vancomycin.  Recent CT scan showed no drainable fluid collection.  Airway remained patent with improvement in elevated to tolerate oral intake.  No  surgery needed. -As per infectious disease note from 3/11: Plan to continue antibiotics till April 2.   #Left neck cellulitis: No drainable fluid collection on repeat CT scan.  ENT consult appreciated.  Completed 3 doses of IV Decadron and currently on empiric IV vancomycin.  Swallow evaluation.  #Chronic pain management, anxiety depression: Continue Cymbalta, oxycodone and Zyprexa.  #Pancytopenia due to bone marrow suppression in the setting of sepsis/endocarditis and possibly with IV nafcillin.  GI was consulted who thought this was not in the setting of active GI bleed.  Bone marrow biopsy suggested secondary changes possibly due to medication.  Hematology consult appreciated.  Monitor CBC.  #Hypokalemia: Continue both oral and IV potassium chloride.  Magnesium level acceptable.  Repeat lab in the morning.  #PICC line placed on 03/21/2017  #History of IV drug abuse currently on Suboxone.  #Asymptomatic bradycardia: Especially when lying.  I will check 12-lead EKG especially since patient is on  psych medications.  Continue telemetry monitoring.  DVT prophylaxis: Lovenox subcutaneous Code Status: Full code Family Communication: No family at bedside Disposition Plan: Likely discharge home after completion of IV antibiotics    Consultants:   ENT  GI  Hematology  Procedures: CT neck, bone marrow biopsy, TEE Antimicrobials: IV Ancef 2/-2/8 Zyvox 2/5-2/6, 2/17-/18 IV nafcillin 2/8-3/6 IV cefepime 3/6-3/9 IV cefazolin 3/9 - IV vancomycin 3/13-   Subjective: Seen and examined at bedside.  No new event.  He denies headache, dizziness.  Able to swallow.  No shortness of breath. Objective: Vitals:   03/28/17 0453 03/28/17 0500 03/28/17 8338  03/28/17 1100  BP: (!) 115/91  101/73 101/79  Pulse: (!) 41  (!) 47 76  Resp: 14  (!) 0   Temp:    (!) 97.3 F (36.3 C)  TempSrc:    Oral  SpO2: 95%  94% 93%  Weight:  79.2 kg (174 lb 9.7 oz)    Height:       No intake or output data  in the 24 hours ending 03/28/17 1119 Filed Weights   03/26/17 0500 03/27/17 0325 03/28/17 0500  Weight: 76.2 kg (167 lb 15.9 oz) 77.2 kg (170 lb 3.1 oz) 79.2 kg (174 lb 9.7 oz)    Examination:  General exam: Appears calm and comfortable  Neck: Improved swelling on the left neck Respiratory system: Clear bilateral, respiratory for normal.  No wheezing Cardiovascular system: S1 & S2 heard, RRR.  No pedal edema. Gastrointestinal system: Abdomen is nondistended, soft and nontender. Normal bowel sounds heard. Central nervous system: Alert and oriented. No focal neurological deficits. Skin: No rashes, lesions or ulcers Psychiatry: Judgement and insight appear normal. Mood & affect appropriate.     Data Reviewed: I have personally reviewed following labs and imaging studies  CBC: Recent Labs  Lab 03/22/17 0224  03/23/17 0737 03/24/17 1157 03/25/17 0309 03/26/17 0801 03/27/17 0415  WBC 0.7*   < > 0.5* 0.9* 1.9* 4.1 7.0  NEUTROABS 0.0*  --  0.0*  --   --   --   --   HGB 7.4*   < > 7.4* 7.8* 8.2* 8.3* 7.9*  HCT 21.8*   < > 21.0* 23.0* 23.7* 23.7* 23.3*  MCV 86.5   < > 86.1 85.2 85.6 85.6 86.0  PLT 146*  147*   < > 171 212 249 250 251   < > = values in this interval not displayed.   Basic Metabolic Panel: Recent Labs  Lab 03/22/17 0224 03/23/17 0418 03/24/17 0449 03/26/17 0801 03/27/17 0415 03/28/17 0500  NA 132* 132* 133* 137 135 137  K 3.5 3.2* 3.2* 2.7* 3.3* 3.0*  CL 107 103 103 101 101 102  CO2 16* 19* 20* '24 23 23  ' GLUCOSE 107* 97 87 159* 177* 149*  BUN '10 9 7 13 ' 22* 21*  CREATININE 0.82 0.78 0.75 0.69 0.72 0.71  CALCIUM 8.4* 8.8* 8.9 8.8* 8.7* 8.8*  MG 1.6*  --   --  1.9  --  2.1   GFR: Estimated Creatinine Clearance: 109.3 mL/min (by C-G formula based on SCr of 0.71 mg/dL). Liver Function Tests: Recent Labs  Lab 03/22/17 0224  AST 11*  ALT 9*  ALKPHOS 69  BILITOT 2.0*  PROT 5.8*  ALBUMIN 1.8*   No results for input(s): LIPASE, AMYLASE in the last 168  hours. No results for input(s): AMMONIA in the last 168 hours. Coagulation Profile: Recent Labs  Lab 03/22/17 0224  INR 1.40   Cardiac Enzymes: No results for input(s): CKTOTAL, CKMB, CKMBINDEX, TROPONINI in the last 168 hours. BNP (last 3 results) No results for input(s): PROBNP in the last 8760 hours. HbA1C: No results for input(s): HGBA1C in the last 72 hours. CBG: No results for input(s): GLUCAP in the last 168 hours. Lipid Profile: No results for input(s): CHOL, HDL, LDLCALC, TRIG, CHOLHDL, LDLDIRECT in the last 72 hours. Thyroid Function Tests: No results for input(s): TSH, T4TOTAL, FREET4, T3FREE, THYROIDAB in the last 72 hours. Anemia Panel: No results for input(s): VITAMINB12, FOLATE, FERRITIN, TIBC, IRON, RETICCTPCT in the last 72 hours. Sepsis Labs: No results for input(s):  PROCALCITON, LATICACIDVEN in the last 168 hours.  Recent Results (from the past 240 hour(s))  Culture, blood (Routine X 2) w Reflex to ID Panel     Status: None   Collection Time: 03/19/17 10:40 AM  Result Value Ref Range Status   Specimen Description BLOOD LEFT HAND  Final   Special Requests IN PEDIATRIC BOTTLE Blood Culture adequate volume  Final   Culture   Final    NO GROWTH 5 DAYS Performed at [facility], [address], [place], [place] [address]    Report Status 03/24/2017 FINAL  Final  Culture, blood (Routine X 2) w Reflex to ID Panel     Status: None   Collection Time: 03/19/17 10:50 AM  Result Value Ref Range Status   Specimen Description BLOOD LEFT ARM  Final   Special Requests IN PEDIATRIC BOTTLE Blood Culture adequate volume  Final   Culture   Final    NO GROWTH 5 DAYS Performed at [facility], [place] [address], [place], [place] [address]    Report Status 03/24/2017 FINAL  Final  Rapid Strep Screen (Not at [facility])     Status: None   Collection Time: 03/20/17  8:20 AM  Result Value Ref Range Status   Streptococcus, Group A Screen (Direct) NEGATIVE NEGATIVE Final     Comment: (NOTE) A Rapid Antigen test may result negative if the antigen level in the sample is below the detection level of this test. The FDA has not cleared this test as a stand-alone test therefore the rapid antigen negative result has reflexed to a Group A Strep culture.   Culture, group A strep     Status: None   Collection Time: 03/20/17  8:20 AM  Result Value Ref Range Status   Specimen Description THROAT  Final   Special Requests NONE Reflexed from [id number]  Final   Culture   Final    NO GROUP A STREP (S.PYOGENES) ISOLATED Performed at [facility], [address] [place], [place] [address]    Report Status 03/22/2017 FINAL  Final  Culture, blood (routine x 2)     Status: None   Collection Time: 03/21/17 11:15 PM  Result Value Ref Range Status   Specimen Description BLOOD RIGHT HAND  Final   Special Requests IN PEDIATRIC BOTTLE Blood Culture adequate volume  Final   Culture   Final    NO GROWTH 5 DAYS Performed at [facility], [place] [address] [place], [place] [address]    Report Status 03/27/2017 FINAL  Final  Culture, blood (routine x 2)     Status: None   Collection Time: 03/21/17 11:25 PM  Result Value Ref Range Status   Specimen Description BLOOD RIGHT HAND  Final   Special Requests IN PEDIATRIC BOTTLE Blood Culture adequate volume  Final   Culture   Final    NO GROWTH 5 DAYS Performed at [facility], [place] [address] [place], [place] [address]    Report Status 03/27/2017 FINAL  Final         Radiology Studies: No results found.      Scheduled Meds: . buprenorphine-naloxone  2 tablet Sublingual Daily  . DULoxetine  60 mg Oral BID  . enoxaparin (LOVENOX) injection  40 mg Subcutaneous Q24H  . ferrous sulfate  325 mg Oral TID WC  . gadobenate dimeglumine  15 mL Intravenous Once  . multivitamin with minerals  1 tablet Oral Daily  . nicotine  7 mg Transdermal Q24H  . OLANZapine  5 mg Oral q morning - 10a  . pantoprazole  40 mg Oral  BID  . polyethylene glycol  17 g Oral Daily  . potassium chloride  40 mEq Oral BID  . senna-docusate  1 tablet Oral BID   Continuous Infusions: . sodium chloride    .  ceFAZolin (ANCEF) IV Stopped (03/28/17 1792)  . dextrose 5 % and 0.45% NaCl 65 mL/hr at 03/28/17 0500  . potassium chloride 10 mEq (03/28/17 1028)     LOS: 74 days    Neriyah Cercone Tanna Furry, MD [facility] Pager [phone]  If 7PM-7AM, please contact night-coverage [email or web address] Password [email or web address] 03/28/2017, 11:19 AM

## 2017-03-28 NOTE — Progress Notes (Signed)
[facility]  Date of Admission:  02/13/2017             ASSESSMENT/PLAN  MSSA Bacteremia / Endocarditis - Stable and continues to receive cefazolin with no adverse side effects. Has remained afebrile with improvement in her WBC. Continue with treatment plan with end date of 4/2.  Neck Myositis / Cellulitis - Continues to improve. Remains on puree diet per SLP. Continue to monitor.  ID will be available as needed during the remainder of her hospitalization.   Principal Problem:   MSSA bacteremia Active Problems:   Sepsis affecting skin (HCC)   Cellulitis of left hand   Staphylococcus aureus bacteremia with sepsis (HCC)   Weakness of both lower extremities   Pain of right clavicle   IVDU (intravenous drug user)   Septic arthritis of right sternoclavicular joint (HCC)   PFO (patent foramen ovale)   Tricuspid valve vegetation   Chronic diastolic CHF (congestive heart failure) (HCC)   Blood per rectum   Hematuria   Infectious myositis L neck   Pancytopenia (HCC)   . buprenorphine-naloxone  2 tablet Sublingual Daily  . DULoxetine  60 mg Oral BID  . enoxaparin (LOVENOX) injection  40 mg Subcutaneous Q24H  . ferrous sulfate  325 mg Oral TID WC  . gadobenate dimeglumine  15 mL Intravenous Once  . multivitamin with minerals  1 tablet Oral Daily  . nicotine  7 mg Transdermal Q24H  . OLANZapine  5 mg Oral q morning - 10a  . pantoprazole  40 mg Oral BID  . polyethylene glycol  17 g Oral Daily  . potassium chloride  40 mEq Oral BID  . senna-docusate  1 tablet Oral BID    SUBJECTIVE:  Afebrile overnight with no new concerns. Evaluated by SLP with recommendation for continued puree diet. Voice is improved.   Allergies  Allergen Reactions  . Azithromycin Nausea And Vomiting    ABDOMINAL PAIN  . Nsaids     UNSPECIFIED REACTION    . Hydrocodone Nausea And Vomiting  . Penicillins Nausea And Vomiting     Has patient had a PCN reaction causing  immediate rash, facial/tongue/throat swelling, SOB or lightheadedness with hypotension: No Has patient had a PCN reaction causing severe rash involving mucus membranes or skin necrosis: No Has patient had a PCN reaction that required hospitalization No Has patient had a PCN reaction occurring within the last 10 years: No If all of the above answers are "NO", then may proceed with Cephalosporin use.   . Prednisone Nausea Only and Other (See Comments)    Can take shot, but pill form "caused stomach pain , nausea"     Review of Systems: Review of Systems  Constitutional: Negative for chills, fever, malaise/fatigue and weight loss.  Respiratory: Negative for cough, shortness of breath and wheezing.   Cardiovascular: Negative for chest pain.  Gastrointestinal: Negative for abdominal pain, constipation, diarrhea, nausea and vomiting.  Skin: Negative for rash.  Neurological: Negative for weakness.      OBJECTIVE: Vitals:   03/28/17 0353 03/28/17 0453 03/28/17 0500 03/28/17 0554  BP: 95/69 (!) 115/91  101/73  Pulse: (!) 45 (!) 41  (!) 47  Resp: (!) 0 14  (!) 0  Temp:      TempSrc:      SpO2: 91% 95%  94%  Weight:   174 lb 9.7 oz (79.2 kg)   Height:       Body mass index is 27.35 kg/m.  Physical Exam  Constitutional: She is oriented to person, place, and time and well-developed, well-nourished, and in no distress. No distress.  Cardiovascular: Exam reveals no gallop and no friction rub.  Murmur heard. PICC line in place, patent and without evidence of infection.  Pulmonary/Chest: Effort normal. No respiratory distress. She has no wheezes. She has no rales. She exhibits no tenderness.  Abdominal: Soft. Bowel sounds are normal.  Neurological: She is alert and oriented to person, place, and time.  Skin: Skin is warm and dry. No rash noted.  Psychiatric: Affect normal.    Lab Results Lab Results  Component Value Date   WBC 7.0 03/27/2017   HGB 7.9 (L) 03/27/2017   HCT 23.3  (L) 03/27/2017   MCV 86.0 03/27/2017   PLT 251 03/27/2017    Lab Results  Component Value Date   CREATININE 0.71 03/28/2017   BUN 21 (H) 03/28/2017   NA 137 03/28/2017   K 3.0 (L) 03/28/2017   CL 102 03/28/2017   CO2 23 03/28/2017    Lab Results  Component Value Date   ALT 9 (L) 03/22/2017   AST 11 (L) 03/22/2017   ALKPHOS 69 03/22/2017   BILITOT 2.0 (H) 03/22/2017     Microbiology: Recent Results (from the past 240 hour(s))  Culture, blood (Routine X 2) w Reflex to ID Panel     Status: None   Collection Time: 03/19/17 10:40 AM  Result Value Ref Range Status   Specimen Description BLOOD LEFT HAND  Final   Special Requests IN PEDIATRIC BOTTLE Blood Culture adequate volume  Final   Culture   Final    NO GROWTH 5 DAYS Performed at [facility], [address] [place], [place] [address]    Report Status 03/24/2017 FINAL  Final  Culture, blood (Routine X 2) w Reflex to ID Panel     Status: None   Collection Time: 03/19/17 10:50 AM  Result Value Ref Range Status   Specimen Description BLOOD LEFT ARM  Final   Special Requests IN PEDIATRIC BOTTLE Blood Culture adequate volume  Final   Culture   Final    NO GROWTH 5 DAYS Performed at [facility], [address] [place], [place] [address]    Report Status 03/24/2017 FINAL  Final  Rapid Strep Screen (Not at [facility])     Status: None   Collection Time: 03/20/17  8:20 AM  Result Value Ref Range Status   Streptococcus, Group A Screen (Direct) NEGATIVE NEGATIVE Final    Comment: (NOTE) A Rapid Antigen test may result negative if the antigen level in the sample is below the detection level of this test. The FDA has not cleared this test as a stand-alone test therefore the rapid antigen negative result has reflexed to a Group A Strep culture.   Culture, group A strep     Status: None   Collection Time: 03/20/17  8:20 AM  Result Value Ref Range Status   Specimen Description THROAT  Final   Special Requests NONE  Reflexed from [id number]  Final   Culture   Final    NO GROUP A STREP (S.PYOGENES) ISOLATED Performed at [facility], [address] [place], [place] [address]    Report Status 03/22/2017 FINAL  Final  Culture, blood (routine x 2)     Status: None   Collection Time: 03/21/17 11:15 PM  Result Value Ref Range Status   Specimen Description BLOOD RIGHT HAND  Final  Special Requests IN PEDIATRIC BOTTLE Blood Culture adequate volume  Final   Culture   Final    NO GROWTH 5 DAYS Performed at [facility], [address] [place], [place] [address]    Report Status 03/27/2017 FINAL  Final  Culture, blood (routine x 2)     Status: None   Collection Time: 03/21/17 11:25 PM  Result Value Ref Range Status   Specimen Description BLOOD RIGHT HAND  Final   Special Requests IN PEDIATRIC BOTTLE Blood Culture adequate volume  Final   Culture   Final    NO GROWTH 5 DAYS Performed at [facility], [address], [place], [place] [address]    Report Status 03/27/2017 FINAL  Final     Marcos Eke, NP [facility] [phone] Pager  03/28/2017  11:08 AM

## 2017-03-29 LAB — BASIC METABOLIC PANEL
ANION GAP: 12 (ref 5–15)
BUN: 13 mg/dL (ref 6–20)
CO2: 24 mmol/L (ref 22–32)
Calcium: 8.2 mg/dL — ABNORMAL LOW (ref 8.9–10.3)
Chloride: 103 mmol/L (ref 101–111)
Creatinine, Ser: 0.67 mg/dL (ref 0.44–1.00)
GFR calc Af Amer: >60 mL/min (ref >60)
GFR calc non Af Amer: >60 mL/min (ref >60)
GLUCOSE: 89 mg/dL (ref 65–99)
POTASSIUM: 2.7 mmol/L — AB (ref 3.5–5.1)
Sodium: 139 mmol/L (ref 135–145)

## 2017-03-29 MED ORDER — POTASSIUM CHLORIDE 10 MEQ/100ML IV SOLN
10.0000 meq | INTRAVENOUS | Status: AC
  Administered 2017-03-29 (×3): 10 meq via INTRAVENOUS
  Filled 2017-03-29 (×3): qty 100

## 2017-03-29 MED ORDER — ACETAMINOPHEN 325 MG PO TABS
650.0000 mg | ORAL_TABLET | Freq: Four times a day (QID) | ORAL | Status: DC | PRN
  Administered 2017-03-29 – 2017-04-11 (×8): 650 mg via ORAL
  Filled 2017-03-29 (×11): qty 2

## 2017-03-29 NOTE — Progress Notes (Signed)
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  Patient was not felt to be a candidate for valve replacement at this time due to her ongoing drug use.  She was also noted to have right sternoclavicular septic arthritis.  She was initially maintained on IV nafcillin to complete a total of 6-8 weeks of IV antibiotics as she was not safe to discharge home with PICC line and no facility was willing to accept the patient.  Her course has been complicated by an episode on 03/19/2017 of sepsis physiology with worsening pancytopenia and neutropenia for which hematology was consulted and is following as well as worsening cellulitis and myositis of the left neck and shoulder area.  With her progressive anemia, prior history of GI bleed, and reports of small amounts of blood in her stool and dark stools GI was consulted but felt that patient was not having lower GI bleed.  Evaluated by ENT and brought on antibiotics.  Assessment & Plan:   # MSSA tricuspid valve endocarditis/MSSA bacteremia: -Patient was initially treated on IV nafcillin.  This is discontinued because of suspected cause of bone marrow suppression.  The antibiotics changed to cefepime for 3 days and then to Ancef.  Now counts are improving.  Evaluated by ENT for left neck myositis and cellulitis in the setting of IV drug abuse. ENT recommend to broaden antibiotics.  Added vancomycin IV on 3/13. Re-evaluated by ID Dr. Linus Salmons and discontinue vancomycin.  Recent CT scan showed no drainable fluid collection.  Airway remained patent with improvement in elevated to tolerate oral intake.  No  surgery needed. -As per infectious disease note from 3/11: Plan to continue antibiotics till April 2.   #Left neck cellulitis: No drainable fluid collection on repeat CT scan.  ENT consult appreciated.  Completed 3 doses of IV Decadron and currently on empiric IV vancomycin.  Swallow evaluation as needed.  #Chronic pain management, anxiety depression: Continue Cymbalta, oxycodone and Zyprexa.  Added Tylenol.  #Pancytopenia due to bone marrow suppression in the setting of sepsis/endocarditis and possibly with IV nafcillin.  GI was consulted who thought this was not in the setting of active GI bleed.  Bone marrow biopsy suggested secondary changes possibly due to medication.  Hematology consult appreciated.  Monitor CBC.  #Hypokalemia: Repleting both IV and oral potassium chloride.  Magnesium level acceptable.  Encourage oral intake.  Repeat lab in the morning.  #PICC line placed on 03/21/2017  #History of IV drug abuse currently on Suboxone.  #Asymptomatic bradycardia: EKG reviewed.  Heart rate now improving.  Discontinue telemetry.  #Bilateral lower extremities trace edema: More on the left side.  I will check Doppler ultrasound to rule out DVT.  DVT prophylaxis: Lovenox subcutaneous Code Status: Full code Family Communication: No family at bedside Disposition Plan: Likely discharge home after completion of IV antibiotics    Consultants:   ENT  GI  Hematology  Procedures: CT neck, bone marrow biopsy, TEE Antimicrobials: IV Ancef 2/-2/8 Zyvox 2/5-2/6, 2/17-/18 IV nafcillin 2/8-3/6 IV cefepime 3/6-3/9 IV cefazolin 3/9 - IV vancomycin 3/13-   Subjective: Seen and examined at bedside.  Reported trace lower extremities edema.  Denies headache,  dizziness, nausea vomiting chest pain.  Able to swallow. Objective: Vitals:   03/29/17 0104 03/29/17 0204 03/29/17 0304 03/29/17 0404  BP: 100/65 106/80 103/67 92/68  Pulse: (!) 58 (!) 59    Resp: 19 18 (!) 23 (!) 9  Temp:        TempSrc:      SpO2: 93% 93%    Weight:      Height:        Intake/Output Summary (Last 24 hours) at 03/29/2017 1115 Last data filed at 03/29/2017 0647 Gross per 24 hour  Intake 240 ml  Output -  Net 240 ml   Filed Weights   03/26/17 0500 03/27/17 0325 03/28/17 0500  Weight: 76.2 kg (167 lb 15.9 oz) 77.2 kg (170 lb 3.1 oz) 79.2 kg (174 lb 9.7 oz)    Examination:  General exam: Calm comfortable. Neck: Left neck swelling present.  No erythema. Respiratory system: Clear bilateral, respiratory for normal.  No wheezing Cardiovascular system: S1 & S2 heard, RRR.  Bilateral lower extremities trace edema L>R Gastrointestinal system: Abdomen is nondistended, soft and nontender. Normal bowel sounds heard. Central nervous system: Alert and oriented. No focal neurological deficits. Skin: No rashes, lesions or ulcers Psychiatry: Judgement and insight appear normal. Mood & affect appropriate.     Data Reviewed: I have personally reviewed following labs and imaging studies  CBC: Recent Labs  Lab 03/23/17 0737 03/24/17 1157 03/25/17 0309 03/26/17 0801 03/27/17 0415  WBC 0.5* 0.9* 1.9* 4.1 7.0  NEUTROABS 0.0*  --   --   --   --   HGB 7.4* 7.8* 8.2* 8.3* 7.9*  HCT 21.0* 23.0* 23.7* 23.7* 23.3*  MCV 86.1 85.2 85.6 85.6 86.0  PLT 171 212 249 250 017   Basic Metabolic Panel: Recent Labs  Lab 03/24/17 0449 03/26/17 0801 03/27/17 0415 03/28/17 0500 03/29/17 0345  NA 133* 137 135 137 139  K 3.2* 2.7* 3.3* 3.0* 2.7*  CL 103 101 101 102 103  CO2 20* _0 GLUCOSE 87 159* 177* 149* 89  BUN 7 13 22* 21* 13  CREATININE 0.75 0.69 0.72 0.71 0.67  CALCIUM 8.9 8.8* 8.7* 8.8* 8.2*  MG  --  1.9  --  2.1  --    GFR: Estimated Creatinine Clearance: 109.3 mL/min (by C-G formula based on SCr of 0.67 mg/dL). Liver Function Tests: No results for input(s): AST, ALT, ALKPHOS, BILITOT, PROT, ALBUMIN in the last 168 hours. No results for input(s): LIPASE, AMYLASE in the last 168  hours. No results for input(s): AMMONIA in the last 168 hours. Coagulation Profile: No results for input(s): INR, PROTIME in the last 168 hours. Cardiac Enzymes: No results for input(s): CKTOTAL, CKMB, CKMBINDEX, TROPONINI in the last 168 hours. BNP (last 3 results) No results for input(s): PROBNP in the last 8760 hours. HbA1C: No results for input(s): HGBA1C in the last 72 hours. CBG: No results for input(s): GLUCAP in the last 168 hours. Lipid Profile: No results for input(s): CHOL, HDL, LDLCALC, TRIG, CHOLHDL, LDLDIRECT in the last 72 hours. Thyroid Function Tests: No results for input(s): TSH, T4TOTAL, FREET4, T3FREE, THYROIDAB in the last 72 hours. Anemia Panel: No results for input(s): VITAMINB12, FOLATE, FERRITIN, TIBC, IRON, RETICCTPCT in the last 72 hours. Sepsis Labs: No results for input(s): PROCALCITON, LATICACIDVEN in the last 168 hours.  Recent Results (from the past 240 hour(s))  Rapid Strep Screen (Not at [facility]     Status: None   Collection Time: 03/20/17  8:20  AM  Result Value Ref Range Status   Streptococcus, Group A Screen (Direct) NEGATIVE NEGATIVE Final    Comment: (NOTE) A Rapid Antigen test may result negative if the antigen level in the sample is below the detection level of this test. The FDA has not cleared this test as a stand-alone test therefore the rapid antigen negative result has reflexed to a Group A Strep culture.   Culture, group A strep     Status: None   Collection Time: 03/20/17  8:20 AM  Result Value Ref Range Status   Specimen Description THROAT  Final   Special Requests NONE Reflexed from [id number]  Final   Culture   Final    NO GROUP A STREP (S.PYOGENES) ISOLATED Performed at [facility], [address] [place], [place] [address]    Report Status 03/22/2017 FINAL  Final  Culture, blood (routine x 2)     Status: None   Collection Time: 03/21/17 11:15 PM  Result Value Ref Range Status   Specimen Description BLOOD RIGHT HAND   Final   Special Requests IN PEDIATRIC BOTTLE Blood Culture adequate volume  Final   Culture   Final    NO GROWTH 5 DAYS Performed at [facility], [place] [address] [place], [place] [address]    Report Status 03/27/2017 FINAL  Final  Culture, blood (routine x 2)     Status: None   Collection Time: 03/21/17 11:25 PM  Result Value Ref Range Status   Specimen Description BLOOD RIGHT HAND  Final   Special Requests IN PEDIATRIC BOTTLE Blood Culture adequate volume  Final   Culture   Final    NO GROWTH 5 DAYS Performed at [facility], [place] [address] [place], [place] [address]    Report Status 03/27/2017 FINAL  Final         Radiology Studies: No results found.      Scheduled Meds: . buprenorphine-naloxone  2 tablet Sublingual Daily  . DULoxetine  60 mg Oral BID  . enoxaparin (LOVENOX) injection  40 mg Subcutaneous Q24H  . ferrous sulfate  325 mg Oral TID WC  . gadobenate dimeglumine  15 mL Intravenous Once  . multivitamin with minerals  1 tablet Oral Daily  . nicotine  7 mg Transdermal Q24H  . OLANZapine  5 mg Oral q morning - 10a  . pantoprazole  40 mg Oral BID  . polyethylene glycol  17 g Oral Daily  . potassium chloride  40 mEq Oral BID  . senna-docusate  1 tablet Oral BID   Continuous Infusions: . sodium chloride    .  ceFAZolin (ANCEF) IV Stopped (03/29/17 0555)     LOS: 38 days    Mycal Conde Tanna Furry, MD [facility] Pager [phone]  If 7PM-7AM, please contact night-coverage [email or web address] Password [email or web address] 03/29/2017, 11:15 AM

## 2017-03-30 ENCOUNTER — Encounter (HOSPITAL_COMMUNITY): Payer: Medicaid Other

## 2017-03-30 ENCOUNTER — Inpatient Hospital Stay (HOSPITAL_COMMUNITY): Payer: Medicaid Other

## 2017-03-30 LAB — BASIC METABOLIC PANEL
Anion gap: 9 (ref 5–15)
BUN: 7 mg/dL (ref 6–20)
CHLORIDE: 103 mmol/L (ref 101–111)
CO2: 26 mmol/L (ref 22–32)
CREATININE: 0.64 mg/dL (ref 0.44–1.00)
Calcium: 8.2 mg/dL — ABNORMAL LOW (ref 8.9–10.3)
Glucose, Bld: 80 mg/dL (ref 65–99)
Potassium: 3.5 mmol/L (ref 3.5–5.1)
SODIUM: 138 mmol/L (ref 135–145)

## 2017-03-30 NOTE — Progress Notes (Signed)
Pts diet advanced by MD to dys 3 thin liquids despite recommendation for honey thick liquids following aspiration of thin and nectar thick liquids on MBS.   Pt would need repeat MBS to determine safety with advanced textures, though MBS potentially not available today. Made request to radiology for MBS if possible. RN aware. Harlon DittyBonnie Laramie Gelles, MA CCC-SLP [phone]

## 2017-03-30 NOTE — Progress Notes (Signed)
Modified Barium Swallow Progress Note  Patient Details  Name: Tammy ArabCasey M Kavanaugh MRN: [id number] Date of Birth: 10/31/1985  Today's Date: 03/30/2017  Modified Barium Swallow completed.  Full report located under Chart Review in the Imaging Section.  Brief recommendations include the following:  Clinical Impression  Moderate pharyngeal dysphagia persists, with pharyngeal edema impacting swallow function, specifically epiglottic deflection and laryngeal closure. This results in laryngeal penetration of thin and nectar thick liquids during the swallow, and moderate residue in the valleculae, pyriforms and along the posterior pharyngeal wall. While the amount of penetration is decreased from prior study, there is questionable trace aspiration with nectar and pt is still at risk for aspiration of thin and nectar-thick liquids. The weight of honey thick liquids, purees facilitate epiglottic deflection and therefore pharyngeal clearance, reducing risk for aspiration after the swallow. With regular solids, there is severe vallecular residue. Compensatory techniques including chin tuck, right and left head turn did not significantly improve pharyngeal clearance. Pt reviewed study results in real time and SLP provided recommendations, rationale. Recommend she continue honey-thick liquids, purees, meds crushed for now, with good prognosis for recovery as edema improves.    Swallow Evaluation Recommendations       SLP Diet Recommendations: Dysphagia 1 (Puree) solids;Honey thick liquids   Liquid Administration via: Cup;Spoon   Medication Administration: Crushed with puree   Supervision: Patient able to self feed;Intermittent supervision to cue for compensatory strategies   Compensations: Slow rate;Small sips/bites;Multiple dry swallows after each bite/sip       Oral Care Recommendations: Oral care BID;Oral care before and after PO   Other Recommendations: Have oral suction available;Remove water  pitcher;Prohibited food (jello, ice cream, thin soups);Order thickener from pharmacy  Rondel BatonMary Beth Sabiha Sura, MS, CCC-SLP Speech-Language Pathologist [phone]  Arlana LindauMary E Lucilla Petrenko 03/30/2017,2:58 PM

## 2017-03-30 NOTE — Progress Notes (Signed)
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  Patient was not felt to be a candidate for valve replacement at this time due to her ongoing drug use.  She was also noted to have right sternoclavicular septic arthritis.  She was initially maintained on IV nafcillin to complete a total of 6-8 weeks of IV antibiotics as she was not safe to discharge home with PICC line and no facility was willing to accept the patient.  Her course has been complicated by an episode on 03/19/2017 of sepsis physiology with worsening pancytopenia and neutropenia for which hematology was consulted and is following as well as worsening cellulitis and myositis of the left neck and shoulder area.  With her progressive anemia, prior history of GI bleed, and reports of small amounts of blood in her stool and dark stools GI was consulted but felt that patient was not having lower GI bleed.  Evaluated by ENT and brought on antibiotics.  Assessment & Plan:   # MSSA tricuspid valve endocarditis/MSSA bacteremia: -Patient was initially treated on IV nafcillin.  This is discontinued because of suspected cause of bone marrow suppression.  The antibiotics changed to cefepime for 3 days and then to Ancef.  Now counts are improving.  Evaluated by ENT for left neck myositis and cellulitis in the setting of IV drug abuse. ENT recommend to broaden antibiotics.  Added vancomycin IV on 3/13. Re-evaluated by ID Dr. Comer and discontinue vancomycin.  Recent CT scan showed no drainable fluid collection.  Airway remained patent with improvement in elevated to tolerate oral intake.  No  surgery needed. -As per infectious disease note from 3/11: Plan to continue antibiotics till April 2.   #Left neck cellulitis: No drainable fluid collection on repeat CT scan.  ENT consult appreciated.  Completed 3 doses of IV Decadron and currently on empiric IV vancomycin.   -Patient insisted on advancing her diet yesterday therefore changed to dysphagia level 3.  Today she reported tolerating well without difficulty.  Speech and swallow evaluation ongoing and planning for barium swallow.  Watch for any sign of aspiration.  Patient understands the risk and importance of close monitoring.  #Chronic pain management, anxiety depression: Continue Cymbalta, oxycodone and Zyprexa.  Added Tylenol.  #Pancytopenia due to bone marrow suppression in the setting of sepsis/endocarditis and possibly with IV nafcillin.  GI was consulted who thought this was not in the setting of active GI bleed.  Bone marrow biopsy suggested secondary changes possibly due to medication.  Hematology consult appreciated.  Monitor CBC.  #Hypokalemia: Repleting both IV and oral potassium chloride.  Magnesium level acceptable.  Encourage oral intake.  Repeat lab in the morning.  #PICC line placed on 03/21/2017  #History of IV drug abuse currently on Suboxone.  #Asymptomatic bradycardia: EKG reviewed.  Heart rate now improving.  Discontinue telemetry.  #Bilateral lower extremities trace edema: More on the left side.  Follow-up Doppler ultrasound to rule out DVT.  DVT prophylaxis: Lovenox subcutaneous Code Status: Full code Family Communication: No family at bedside Disposition Plan: Likely discharge home after completion of IV antibiotics    Consultants:   ENT  GI  Hematology    Procedures: CT neck, bone marrow biopsy, TEE Antimicrobials: IV Ancef 2/-2/8 Zyvox 2/5-2/6, 2/17-/18 IV nafcillin 2/8-3/6 IV cefepime 3/6-3/9 IV cefazolin 3/9 - IV vancomycin 3/13-   Subjective: Seen and examined at bedside.  No new  event.  Reported tolerating dysphagia diet well.  No nausea vomiting. Objective: Vitals:   03/30/17 0006 03/30/17 0027 03/30/17 0500 03/30/17 0559  BP: (!) 79/47 (!) 89/50  118/60  Pulse: 64 67  77  Resp: 15   16  Temp: 98.6 F (37 C)   98.3 F (36.8 C)  TempSrc: Oral   Oral  SpO2: 95%   94%  Weight:   80.4 kg (177 lb 4 oz)   Height:        Intake/Output Summary (Last 24 hours) at 03/30/2017 1109 Last data filed at 03/30/2017 9024 Gross per 24 hour  Intake 210 ml  Output -  Net 210 ml   Filed Weights   03/27/17 0325 03/28/17 0500 03/30/17 0500  Weight: 77.2 kg (170 lb 3.1 oz) 79.2 kg (174 lb 9.7 oz) 80.4 kg (177 lb 4 oz)    Examination:  General exam:, Comfortable Neck: Left neck swelling stable compared to yesterday.  No erythema. Respiratory system: Clear bilateral, respiratory for normal.  No wheezing Cardiovascular system: S1 & S2 heard, RRR.  Bilateral lower extremities trace edema L>R Gastrointestinal system: Abdomen is nondistended, soft and nontender. Normal bowel sounds heard. Central nervous system: Alert and oriented. No focal neurological deficits. Skin: No rashes, lesions or ulcers Psychiatry: Judgement and insight appear normal. Mood & affect appropriate.     Data Reviewed: I have personally reviewed following labs and imaging studies  CBC: Recent Labs  Lab 03/24/17 1157 03/25/17 0309 03/26/17 0801 03/27/17 0415  WBC 0.9* 1.9* 4.1 7.0  HGB 7.8* 8.2* 8.3* 7.9*  HCT 23.0* 23.7* 23.7* 23.3*  MCV 85.2 85.6 85.6 86.0  PLT 212 249 250 097   Basic Metabolic Panel: Recent Labs  Lab 03/26/17 0801 03/27/17 0415 03/28/17 0500 03/29/17 0345 03/30/17 0356  NA 137 135 137 139 138  K 2.7* 3.3* 3.0* 2.7* 3.5  CL 101 101 102 103 103  CO2 _0 GLUCOSE 159* 177* 149* 89 80  BUN 13 22* 21* 13 7  CREATININE 0.69 0.72 0.71 0.67 0.64  CALCIUM 8.8* 8.7* 8.8* 8.2* 8.2*  MG 1.9  --  2.1  --   --    GFR: Estimated Creatinine Clearance: 110.1  mL/min (by C-G formula based on SCr of 0.64 mg/dL). Liver Function Tests: No results for input(s): AST, ALT, ALKPHOS, BILITOT, PROT, ALBUMIN in the last 168 hours. No results for input(s): LIPASE, AMYLASE in the last 168 hours. No results for input(s): AMMONIA in the last 168 hours. Coagulation Profile: No results for input(s): INR, PROTIME in the last 168 hours. Cardiac Enzymes: No results for input(s): CKTOTAL, CKMB, CKMBINDEX, TROPONINI in the last 168 hours. BNP (last 3 results) No results for input(s): PROBNP in the last 8760 hours. HbA1C: No results for input(s): HGBA1C in the last 72 hours. CBG: No results for input(s): GLUCAP in the last 168 hours. Lipid Profile: No results for input(s): CHOL, HDL, LDLCALC, TRIG, CHOLHDL, LDLDIRECT in the last 72 hours. Thyroid Function Tests: No results for input(s): TSH, T4TOTAL, FREET4, T3FREE, THYROIDAB in the last 72 hours. Anemia Panel: No results for input(s): VITAMINB12, FOLATE, FERRITIN, TIBC, IRON, RETICCTPCT in the last 72 hours. Sepsis Labs: No results for input(s): PROCALCITON, LATICACIDVEN in the last 168 hours.  Recent Results (from the past 240 hour(s))  Culture, blood (routine x 2)     Status: None   Collection Time: 03/21/17 11:15 PM  Result Value Ref Range Status   Specimen Description BLOOD RIGHT HAND  Final   Special Requests IN PEDIATRIC BOTTLE Blood Culture adequate volume  Final   Culture   Final    NO GROWTH 5 DAYS Performed at [facility], [address], [place], [place] [address]    Report Status 03/27/2017 FINAL  Final  Culture, blood (routine x 2)     Status: None   Collection Time: 03/21/17 11:25 PM  Result Value Ref Range Status   Specimen Description BLOOD RIGHT HAND  Final   Special Requests IN PEDIATRIC BOTTLE Blood Culture adequate volume  Final   Culture   Final    NO GROWTH 5 DAYS Performed at [facility], [place] [address], [place], [place] [address]    Report Status 03/27/2017  FINAL  Final         Radiology Studies: No results found.      Scheduled Meds: . buprenorphine-naloxone  2 tablet Sublingual Daily  . DULoxetine  60 mg Oral BID  . enoxaparin (LOVENOX) injection  40 mg Subcutaneous Q24H  . ferrous sulfate  325 mg Oral TID WC  . gadobenate dimeglumine  15 mL Intravenous Once  . multivitamin with minerals  1 tablet Oral Daily  . nicotine  7 mg Transdermal Q24H  . OLANZapine  5 mg Oral q morning - 10a  . pantoprazole  40 mg Oral BID  . polyethylene glycol  17 g Oral Daily  . potassium chloride  40 mEq Oral BID  . senna-docusate  1 tablet Oral BID   Continuous Infusions: . sodium chloride    .  ceFAZolin (ANCEF) IV Stopped (03/30/17 0657)     LOS: 77 days    Nishika Parkhurst Tanna Furry, MD [facility] Pager [phone]  If 7PM-7AM, please contact night-coverage [email or web address] Password [email or web address] 03/30/2017, 11:09 AM

## 2017-03-31 ENCOUNTER — Inpatient Hospital Stay (HOSPITAL_COMMUNITY): Payer: Medicaid Other

## 2017-03-31 DIAGNOSIS — R609 Edema, unspecified: Secondary | ICD-10-CM

## 2017-03-31 DIAGNOSIS — M7989 Other specified soft tissue disorders: Secondary | ICD-10-CM

## 2017-03-31 MED ORDER — METHOCARBAMOL 500 MG PO TABS
500.0000 mg | ORAL_TABLET | Freq: Three times a day (TID) | ORAL | Status: DC
  Administered 2017-03-31 – 2017-04-15 (×46): 500 mg via ORAL
  Filled 2017-03-31 (×47): qty 1

## 2017-03-31 NOTE — Progress Notes (Signed)
  Speech Language Pathology Treatment: Dysphagia  Patient Details Name: Tammy ArabCasey M [name] MRN: [id number] DOB: 09/22/1985 Today's Date: 03/31/2017 Time: [id number] SLP Time Calculation (min) (ACUTE ONLY): 25 min  Assessment / Plan / Recommendation Clinical Impression  Visited pt at am meal. Despite yesterdays MBS and recommendations pt found sitting edge of bed drinking thin liquids with many empty thin liquid cups; diet tray untouched, wet vocal quality observed. SLP questioned pt about what she understood from yesterdays MBS (which continued to show severe residue with solids, penetration and aspiration of thin and nectar thick liquids. Pt had poor understanding of results, but even after teaching, video play back of MBS pt continue to drink thin liquids and report she was planning to order a hamburger for lunch and get her own foods as she desired from her family and at the desk.   Pt has the right to decide to consume PO of choice with known risk, but officially restricting diet while staff provide thin liquids is not acceptable practice. Given that pt is younger with good pulmonary hygiene otherwise and has tolerated thin liquids over the weekend without evidence of infection, discussed option of accepting risk of aspiration and continuing diet with better use of compensatory strategies. At least then we can monitor pt for signs of aspiration pneumonia resulting from dysphagia. Educated pt at length about moist solid textures, swallowing twice, clearing throat if vocal quality is wet and maintaining oral hygiene to reduce bacterial load. Family also present for this session. Will f/u to reinforce strategies but will liberalize diet per pts wishes.     HPI HPI: Pt is a 32 y.o. female, with past medical history significant for fibromyalgia, anemia, GERD, and history of IV drug abuse. She presented to the ED with a few days history of fever, chills, and generalized weakness muscle aches in addition to  arthralgias.  She was admitted with diagnosis of sepsis.  Pt being treated for endocarditis. On 3/6 she developed erythema of her upper chest/L neck. CT showed myositis of L sternocleidomastoid w/ local cellulitis/ inflammation, but no abscess noted. Pt started to have difficulty swallowing and managing her secretions, so SLP and ENT were consulted on 3/12.      SLP Plan  Continue with current plan of care       Recommendations  Diet recommendations: Regular;Thin liquid Liquids provided via: Cup Medication Administration: Crushed with puree Supervision: Patient able to self feed;Intermittent supervision to cue for compensatory strategies Compensations: Slow rate;Small sips/bites;Multiple dry swallows after each bite/sip;Clear throat intermittently;Hard cough after swallow                Follow up Recommendations: Other (comment) SLP Visit Diagnosis: Dysphagia, pharyngeal phase (R13.13) Plan: Continue with current plan of care       GO               [facility] Hospital For ChildrenBonnie Shloka Baldridge, MA CCC-SLP [phone]  [name], [name] 03/31/2017, 9:22 AM

## 2017-03-31 NOTE — Progress Notes (Addendum)
Update:  Met patient's mother at the bedside in the presence of nursing director of the unit. Patient's mother was upset that patient has so many doctors seeing her since her admission to the hospital. She wants to know patient's further plan and concerned about the persistent swelling in left neck. I discussed with her as follows:  1) The left neck swelling is stable since I started taking care of the patient. Pt's swallow is actually improving gradually and now able to swallow well. Patient has no SOB, N/V or coughing spells. The plan is to continue IV antibiotics as recommended by ID.  I called and discussed with Dr. Blenda Nicely from ENT if she has any further recommendation. Recent imaging studies reviewed. Patient will be re-evaluated by Dr. Blenda Nicely.  2) Plan to continue IV antibiotics and will discuss with the cardiology team towards completion of antibiotics for possible need of TEE/echo. Reviewed cardiology note by Dr. Doylene Canard from 02/27/2017.  3) left swelling L>R; discussed that US doppler was already ordered to r/o DVT.   I tried to explain the recommendation by different consultants including ENT, ID and cardiology.  Patient's mother stated that she understood but she is not sure if her husband agrees with this discussion. I advised her if her husband wants to discuss about patient's condition then this provider is available. Also explained that patient needs close follow up with ENT, Cardiology, ID and PCP even after completion of antibiotics and after discharge from this hospitalization.  Dron Carolin Sicks.

## 2017-03-31 NOTE — Progress Notes (Signed)
ENT Consult Progress Note  Subjective:  Afebrile.Feels neck is still swollen and tender.  Eating regular diet.  Voice much improved.   Objective: Vitals:   03/31/17 0632 03/31/17 1345  BP: (!) 155/71 128/70  Pulse: (!) 59 89  Resp: 15 16  Temp: 98.6 F (37 C) 98.2 F (36.8 C)  SpO2: 95% 98%    Physical Exam: CONSTITUTIONAL: well developed, nourished, no distress and alert and oriented x 3. Appears much less toxic. Brighter affect.  CARDIOVASCULAR: normal rate and regular rhythm PULMONARY/CHEST WALL: effort normal and no stridor, no stertor, no dysphonia. Improved dysphonia HENT: Head : normocephalic and atraumatic Ears: Right ear:   canal normal, external ear normal and hearing normal Left ear:   canal normal, external ear normal and hearing normal Nose: nose normal and no purulence Mouth/Throat:  Mouth: uvula midline and no oral lesions Throat: oropharynx clear and moist Mucous membranes: normal, no thickened secretions in the oropharynx NECK: supple, improved from prior examination. Erythema nearly resolved.  Full ROM. Still firm in level 2, but no fluctuance. Firmness is unchanged, nothing drainage.     Review of data- Afebrile WBC 7   Assessment: [name] 32 y.o. female who presents with left neck myositis and cellulitis int he setting of IVDA. Improving clinical examination on broadened IV antibiotic coverage. Recent CT shows no drainable fluid collections. Airway remains patent with improvement in patient's ability to tolerate secretions. No surgeries are necessary at this time   Plan:  -Continue antibiotics per ID recommendations.  -Will follow -Call with any concerns   Thank you for involving [facility] in the care of this patient. Should you need further assistance, please call our office at [phone].    [name], MD

## 2017-03-31 NOTE — Progress Notes (Signed)
Nutrition Follow-up  DOCUMENTATION CODES:   Not applicable  INTERVENTION:  Continue to monitor for needs Follow SLP recommendations  NUTRITION DIAGNOSIS:   Inadequate oral intake related to lethargy/confusion, poor appetite as evidenced by meal completion < 50%. -ongoing  GOAL:   Patient will meet greater than or equal to 90% of their needs -improving  MONITOR:   PO intake, Supplement acceptance, Labs, Weight trends, Skin, I & O's  REASON FOR ASSESSMENT:   Malnutrition Screening Tool    ASSESSMENT:   32 y.o. female w/ a hx of [id number], anemia, and IV drug abuse who presented with a few days history of fever, chills, generalized weakness muscle aches, and arthralgias.  In the emergency room the patient was noted to have fever, leukocytosis and left hand and bilateral lower extremity redness and petechiae.    3/13 - Changed to NDD1-Honey Thick due to L neck cellulitis and pharyngeal edema 3/16 - Upgraded to NDD3 3/17 - Downgraded back to NDD1 3/18 - Patient eating and drinking thin liquids despite SLP recommendations. Speech discussed patient's risk of aspiration. Now on regular diet  Spoke with her this morning, ate a sausage and cheese biscuit brought by family. No complaints of swallowing or choking issues but vocal quality sounded wet. Denied any current needs.  Labs reviewed Medications reviewed and include:  Iron, MVI w/ Minerals, Miralax, 40K+ BID, Senokot-S  Diet Order:  Diet regular Room service appropriate? Yes; Fluid consistency: Thin  EDUCATION NEEDS:   Education needs have been addressed  Skin:  Skin Assessment: Reviewed RN Assessment  Last BM:  03/23/2017  Height:   Ht Readings from Last 1 Encounters:  03/11/17 5\' 7"  (1.702 m)    Weight:   Wt Readings from Last 1 Encounters:  03/31/17 173 lb 8 oz (78.7 kg)    Ideal Body Weight:  61.4 kg  BMI:  Body mass index is 27.17 kg/m.  Estimated Nutritional Needs:   Kcal:   1700-1900  Protein:  90-105 grams  Fluid:  1.7-1.9 L  Dionne AnoWilliam M. Seung Nidiffer, MS, RD LDN Inpatient Clinical Dietitian Pager [phone]

## 2017-03-31 NOTE — Progress Notes (Signed)
Preliminary notes by tech--Bilateral lower extremities venous study completed.  Bilateral veins negative on deep vein and superficial vein thrombosis. Bilateral popliteal fossa with complex fluid collection. Left groin area multiple lymph notes seen.  Result notified RN Ginger by phone.   [name] Janus  (RDMS, RVT)  03/31/17 12:39 PM

## 2017-03-31 NOTE — Progress Notes (Addendum)
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  Patient was not felt to be a candidate for valve replacement at this time due to her ongoing drug use.  She was also noted to have right sternoclavicular septic arthritis.  She was initially maintained on IV nafcillin to complete a total of 6-8 weeks of IV antibiotics as she was not safe to discharge home with PICC line and no facility was willing to accept the patient.  Her course has been complicated by an episode on 03/19/2017 of sepsis physiology with worsening pancytopenia and neutropenia for which hematology was consulted and is following as well as worsening cellulitis and myositis of the left neck and shoulder area.  With her progressive anemia, prior history of GI bleed, and reports of small amounts of blood in her stool and dark stools GI was consulted but felt that patient was not having lower GI bleed.  Evaluated by ENT and brought on antibiotics.  Assessment & Plan:   # MSSA tricuspid valve endocarditis/MSSA bacteremia: -Patient was initially treated on IV nafcillin.  This is discontinued because of suspected cause of bone marrow suppression.  The antibiotics changed to cefepime for 3 days and then to Ancef.  Now counts are improving.  Evaluated by ENT for left neck myositis and cellulitis in the setting of IV drug abuse. ENT recommend to broaden antibiotics.  Added vancomycin IV on 3/13. Re-evaluated by ID Dr. Linus Salmons and discontinue vancomycin.  Recent CT scan showed no drainable fluid collection.  Airway remained patent with improvement in elevated to tolerate oral intake.  No  surgery needed. -As per infectious disease note from 3/11: Plan to continue antibiotics till April 2.  -as per Dr. Merrilee Jansky note froom 02/27/2017: patient may need TEE after completion of antibiotics.  #Left neck cellulitis: No drainable fluid collection on repeat CT scan.  ENT consult appreciated.  Completed 3 doses of IV Decadron.   -Currently on Ancef IV. -Patient is doing well with the swallow evaluation.  Change to regular diet.  Left neck swelling is stable.  No shortness of breath or change in voice.  #Chronic pain management, anxiety depression: Continue Cymbalta, oxycodone and Zyprexa.  Added Tylenol.  Discussed with the patient to minimize oxycodone especially while she is on Suboxone.  #Pancytopenia due to bone marrow suppression in the setting of sepsis/endocarditis and possibly with IV nafcillin.  GI was consulted who thought this was not in the setting of active GI bleed.  Bone marrow biopsy suggested secondary changes possibly due to medication.  Hematology consult appreciated.  Monitor CBC.  #Hypokalemia: Potassium level acceptable.  Repeat lab intermittently.  Encourage oral intake.    #PICC line placed on 03/21/2017  #History of IV drug abuse currently on Suboxone.  #Asymptomatic bradycardia: EKG reviewed.  Heart rate now improving.  Discontinue telemetry.  #Bilateral lower extremities trace edema: More on the left side.  Follow-up Doppler ultrasound to rule out DVT.   DVT prophylaxis: Lovenox subcutaneous Code Status: Full code Family Communication: No family at bedside Disposition Plan: Likely discharge home after completion of IV antibiotics    Consultants:   ENT  GI  Hematology  Procedures:  CT neck, bone marrow biopsy, TEE Antimicrobials: IV Ancef 2/-2/8 Zyvox 2/5-2/6, 2/17-/18 IV nafcillin 2/8-3/6 IV cefepime 3/6-3/9 IV cefazolin 3/9 - IV vancomycin 3/13-   Subjective: Seen and examined at bedside.  No new event.  Tolerating diet well.  Left neck  swelling is stable, not worsening.  No chest pain, shortness of breath, nausea or vomiting.  No change in voice.  No headache.  Objective: Vitals:   03/30/17 1550 03/30/17 1847 03/30/17 2035 03/31/17 0632  BP: (!) 83/48 97/63 115/78 (!) 155/71  Pulse: 69  81 (!) 59  Resp: 18   15  Temp: 97.8 F (36.6 C)  98 F (36.7 C) 98.6 F (37 C)  TempSrc: Oral  Oral Oral  SpO2: 97%  100% 95%  Weight:      Height:        Intake/Output Summary (Last 24 hours) at 03/31/2017 1130 Last data filed at 03/31/2017 0527 Gross per 24 hour  Intake 200 ml  Output -  Net 200 ml   Filed Weights   03/27/17 0325 03/28/17 0500 03/30/17 0500  Weight: 77.2 kg (170 lb 3.1 oz) 79.2 kg (174 lb 9.7 oz) 80.4 kg (177 lb 4 oz)    Examination:  General exam:, Sitting on bed comfortable, not in distress Neck: Left neck swelling is stable with no erythema. Respiratory system: Clear bilateral, respiratory for normal Cardiovascular system: S1 & S2 heard, RRR.  Bilateral lower extremities trace edema L>R Gastrointestinal system: Abdomen is nondistended, soft and nontender. Normal bowel sounds heard. Central nervous system: Alert and oriented. No focal neurological deficits. Skin: No rashes, lesions or ulcers Psychiatry: Judgement and insight appear normal. Mood & affect appropriate.     Data Reviewed: I have personally reviewed following labs and imaging studies  CBC: Recent Labs  Lab 03/24/17 1157 03/25/17 0309 03/26/17 0801 03/27/17 0415  WBC 0.9* 1.9* 4.1 7.0  HGB 7.8* 8.2* 8.3* 7.9*  HCT 23.0* 23.7* 23.7* 23.3*  MCV 85.2 85.6 85.6 86.0  PLT 212 249 250 007   Basic Metabolic Panel: Recent Labs  Lab 03/26/17 0801 03/27/17 0415 03/28/17 0500 03/29/17 0345 03/30/17 0356  NA 137 135 137 139 138  K 2.7* 3.3* 3.0* 2.7* 3.5  CL 101 101 102 103 103  CO2 _0 GLUCOSE 159* 177* 149* 89 80  BUN 13 22* 21* 13 7  CREATININE 0.69 0.72 0.71 0.67 0.64  CALCIUM 8.8* 8.7* 8.8* 8.2* 8.2*  MG  1.9  --  2.1  --   --    GFR: Estimated Creatinine Clearance: 110.1 mL/min (by C-G formula based on SCr of 0.64 mg/dL). Liver Function Tests: No results for input(s): AST, ALT, ALKPHOS, BILITOT, PROT, ALBUMIN in the last 168 hours. No results for input(s): LIPASE, AMYLASE in the last 168 hours. No results for input(s): AMMONIA in the last 168 hours. Coagulation Profile: No results for input(s): INR, PROTIME in the last 168 hours. Cardiac Enzymes: No results for input(s): CKTOTAL, CKMB, CKMBINDEX, TROPONINI in the last 168 hours. BNP (last 3 results) No results for input(s): PROBNP in the last 8760 hours. HbA1C: No results for input(s): HGBA1C in the last 72 hours. CBG: No results for input(s): GLUCAP in the last 168 hours. Lipid Profile: No results for input(s): CHOL, HDL, LDLCALC, TRIG, CHOLHDL, LDLDIRECT in the last 72 hours. Thyroid Function Tests: No results for input(s): TSH, T4TOTAL, FREET4, T3FREE, THYROIDAB in the last 72 hours. Anemia Panel: No results for input(s): VITAMINB12, FOLATE, FERRITIN, TIBC,  IRON, RETICCTPCT in the last 72 hours. Sepsis Labs: No results for input(s): PROCALCITON, LATICACIDVEN in the last 168 hours.  Recent Results (from the past 240 hour(s))  Culture, blood (routine x 2)     Status: None   Collection Time: 03/21/17 11:15 PM  Result Value Ref Range Status   Specimen Description BLOOD RIGHT HAND  Final   Special Requests IN PEDIATRIC BOTTLE Blood Culture adequate volume  Final   Culture   Final    NO GROWTH 5 DAYS Performed at [facility], [address] [place], [place] [address]    Report Status 03/27/2017 FINAL  Final  Culture, blood (routine x 2)     Status: None   Collection Time: 03/21/17 11:25 PM  Result Value Ref Range Status   Specimen Description BLOOD RIGHT HAND  Final   Special Requests IN PEDIATRIC BOTTLE Blood Culture adequate volume  Final   Culture   Final    NO GROWTH 5 DAYS Performed at [facility],  [place] [address] [place], [place] [address]    Report Status 03/27/2017 FINAL  Final         Radiology Studies: Dg Swallowing Func-speech Pathology  Result Date: 03/30/2017 Objective Swallowing Evaluation: Type of Study: MBS-Modified Barium Swallow Study  Patient Details Name: EMALI HEYWARD MRN: [id number] Date of Birth: 11-03-1985 Today's Date: 03/30/2017 Time: SLP Start Time (ACUTE ONLY): 1345 -SLP Stop Time (ACUTE ONLY): 7846 SLP Time Calculation (min) (ACUTE ONLY): 20 min Past Medical History: Past Medical History: Diagnosis Date . Anemia, unspecified  . Anxiety  . Asthma  . Disc herniation   causes sciatica unsure which disc . Esophageal reflux  . Fatty liver  . Fibromyalgia  . Gastroparesis  . Headache(784.0)  . Heart murmur  . History of narcotic addiction ([place]) 09/30/2012  2010:  Per pt, was addicted to narcotics; mom helped intervene and stopped taking opiods  . Hyperemesis gravidarum with metabolic disturbance, unspecified as to episode of care  . Irritable bowel syndrome  . MSSA bacteremia 02/20/2017 . Other and unspecified noninfectious gastroenteritis and colitis(558.9)  . Pregnant  . PTSD (post-traumatic stress disorder)  . Septic arthritis of right sternoclavicular joint ([place]) 02/20/2017 . Unspecified asthma(493.90)  Past Surgical History: Past Surgical History: Procedure Laterality Date . APPENDECTOMY   . CHOLECYSTECTOMY   . COLONOSCOPY   . LAPAROSCOPIC BILATERAL SALPINGECTOMY Bilateral 12/08/2012  Procedure: LAPAROSCOPIC BILATERAL SALPINGECTOMY;  Surgeon: Jonnie Kind, MD;  Location: [facility];  Service: Gynecology;  Laterality: Bilateral; . MULTIPLE EXTRACTIONS WITH ALVEOLOPLASTY N/A 12/01/2015  Procedure: EXTRACTION TEETH TWO, THREE, FOUR, SIX, SEVEN, EIGHT, NINE, TEN, ELEVEN, TWELVE, FOURTEEN, FIFTEEN, TWENTY, TWENTY ONE, TWENTY EIGHT, TWENTY NINE, THIRTY AND THIRTY ONE WITH ALVEOLOPLASTY;  Surgeon: Diona Browner, DDS;  Location: [facility];  Service: Oral Surgery;  Laterality: N/A; . TEE WITHOUT  CARDIOVERSION N/A 02/20/2017  Procedure: TRANSESOPHAGEAL ECHOCARDIOGRAM (TEE);  Surgeon: Larey Dresser, MD;  Location: [facility];  Service: Cardiovascular;  Laterality: N/A; . TUBAL LIGATION   HPI: Pt is a 32 y.o. female, with past medical history significant for fibromyalgia, anemia, GERD, and history of IV drug abuse. She presented to the ED with a few days history of fever, chills, and generalized weakness muscle aches in addition to arthralgias.  She was admitted with diagnosis of sepsis.  Pt being treated for endocarditis. On 3/6 she developed erythema of her upper chest/L neck. CT showed myositis of L sternocleidomastoid w/ local cellulitis/ inflammation, but no abscess noted. Pt  started to have difficulty swallowing and managing her secretions, so SLP and ENT were consulted on 3/12.  Subjective: pt pleasant, alert and conversant Assessment / Plan / Recommendation CHL IP CLINICAL IMPRESSIONS 03/30/2017 Clinical Impression Moderate pharyngeal dysphagia persists, with pharyngeal edema impacting swallow function, specifically epiglottic deflection and laryngeal closure. This results in laryngeal penetration of thin and nectar thick liquids during the swallow, and moderate residue in the valleculae, pyriforms and along the posterior pharyngeal wall. While the amount of penetration is decreased from prior study, there is questionable trace aspiration with nectar and pt is still at risk for aspiration of thin and nectar-thick liquids. The weight of honey thick liquids, purees facilitate epiglottic deflection and therefore pharyngeal clearance, reducing risk for aspiration after the swallow. With regular solids, there is severe vallecular residue. Compensatory techniques including chin tuck, right and left head turn did not significantly improve pharyngeal clearance. Pt reviewed study results in real time and SLP provided recommendations, rationale. Recommend she continue honey-thick liquids, purees, meds crushed  for now, with good prognosis for recovery as edema improves.  SLP Visit Diagnosis Dysphagia, pharyngeal phase (R13.13) Attention and concentration deficit following -- Frontal lobe and executive function deficit following -- Impact on safety and function Moderate aspiration risk;Severe aspiration risk   CHL IP TREATMENT RECOMMENDATION 03/30/2017 Treatment Recommendations Therapy as outlined in treatment plan below;F/U MBS in --- days (Comment)   Prognosis 03/30/2017 Prognosis for Safe Diet Advancement Good Barriers to Reach Goals -- Barriers/Prognosis Comment -- CHL IP DIET RECOMMENDATION 03/30/2017 SLP Diet Recommendations Dysphagia 1 (Puree) solids;Honey thick liquids Liquid Administration via Cup;Spoon Medication Administration Crushed with puree Compensations Slow rate;Small sips/bites;Multiple dry swallows after each bite/sip Postural Changes --   CHL IP OTHER RECOMMENDATIONS 03/30/2017 Recommended Consults -- Oral Care Recommendations Oral care BID;Oral care before and after PO Other Recommendations Have oral suction available;Remove water pitcher;Prohibited food (jello, ice cream, thin soups);Order thickener from pharmacy   CHL IP FOLLOW UP RECOMMENDATIONS 03/30/2017 Follow up Recommendations Other (comment)   CHL IP FREQUENCY AND DURATION 03/30/2017 Speech Therapy Frequency (ACUTE ONLY) min 3x week Treatment Duration 2 weeks      CHL IP ORAL PHASE 03/30/2017 Oral Phase WFL Oral - Pudding Teaspoon -- Oral - Pudding Cup -- Oral - Honey Teaspoon -- Oral - Honey Cup -- Oral - Nectar Teaspoon -- Oral - Nectar Cup -- Oral - Nectar Straw -- Oral - Thin Teaspoon -- Oral - Thin Cup -- Oral - Thin Straw -- Oral - Puree -- Oral - Mech Soft -- Oral - Regular -- Oral - Multi-Consistency -- Oral - Pill -- Oral Phase - Comment --  CHL IP PHARYNGEAL PHASE 03/30/2017 Pharyngeal Phase Impaired Pharyngeal- Pudding Teaspoon -- Pharyngeal -- Pharyngeal- Pudding Cup -- Pharyngeal -- Pharyngeal- Honey Teaspoon Reduced anterior laryngeal  mobility;Reduced laryngeal elevation;Pharyngeal residue - valleculae;Reduced epiglottic inversion;Reduced pharyngeal peristalsis;Pharyngeal residue - pyriform Pharyngeal -- Pharyngeal- Honey Cup Reduced epiglottic inversion;Reduced anterior laryngeal mobility;Reduced laryngeal elevation;Reduced pharyngeal peristalsis;Pharyngeal residue - valleculae;Pharyngeal residue - pyriform Pharyngeal -- Pharyngeal- Nectar Teaspoon Reduced pharyngeal peristalsis;Reduced epiglottic inversion;Reduced anterior laryngeal mobility;Reduced laryngeal elevation;Reduced airway/laryngeal closure;Penetration/Aspiration during swallow;Penetration/Apiration after swallow;Pharyngeal residue - valleculae;Pharyngeal residue - pyriform;Pharyngeal residue - posterior pharnyx Pharyngeal Material enters airway, remains ABOVE vocal cords and not ejected out Pharyngeal- Nectar Cup Reduced pharyngeal peristalsis;Reduced epiglottic inversion;Reduced anterior laryngeal mobility;Reduced laryngeal elevation;Reduced airway/laryngeal closure;Penetration/Aspiration during swallow;Penetration/Apiration after swallow;Trace aspiration;Pharyngeal residue - valleculae;Pharyngeal residue - pyriform;Pharyngeal residue - posterior pharnyx Pharyngeal Material enters airway, passes BELOW cords without attempt by patient to eject out (  silent aspiration) Pharyngeal- Nectar Straw -- Pharyngeal -- Pharyngeal- Thin Teaspoon Reduced pharyngeal peristalsis;Reduced epiglottic inversion;Reduced anterior laryngeal mobility;Reduced laryngeal elevation;Reduced airway/laryngeal closure;Penetration/Aspiration during swallow;Penetration/Apiration after swallow;Pharyngeal residue - valleculae;Pharyngeal residue - pyriform;Pharyngeal residue - posterior pharnyx Pharyngeal Material enters airway, remains ABOVE vocal cords and not ejected out Pharyngeal- Thin Cup Reduced pharyngeal peristalsis;Reduced epiglottic inversion;Reduced anterior laryngeal mobility;Reduced laryngeal  elevation;Reduced airway/laryngeal closure;Penetration/Aspiration during swallow;Penetration/Apiration after swallow;Pharyngeal residue - valleculae;Pharyngeal residue - pyriform;Pharyngeal residue - posterior pharnyx Pharyngeal Material enters airway, remains ABOVE vocal cords and not ejected out Pharyngeal- Thin Straw -- Pharyngeal -- Pharyngeal- Puree Reduced pharyngeal peristalsis;Reduced epiglottic inversion;Reduced anterior laryngeal mobility;Reduced laryngeal elevation;Reduced airway/laryngeal closure;Pharyngeal residue - valleculae;Pharyngeal residue - posterior pharnyx Pharyngeal -- Pharyngeal- Mechanical Soft -- Pharyngeal -- Pharyngeal- Regular Reduced pharyngeal peristalsis;Reduced epiglottic inversion;Reduced anterior laryngeal mobility;Reduced laryngeal elevation;Reduced airway/laryngeal closure;Pharyngeal residue - valleculae Pharyngeal -- Pharyngeal- Multi-consistency -- Pharyngeal -- Pharyngeal- Pill -- Pharyngeal -- Pharyngeal Comment --  CHL IP CERVICAL ESOPHAGEAL PHASE 03/30/2017 Cervical Esophageal Phase WFL Pudding Teaspoon -- Pudding Cup -- Honey Teaspoon -- Honey Cup -- Nectar Teaspoon -- Nectar Cup -- Nectar Straw -- Thin Teaspoon -- Thin Cup -- Thin Straw -- Puree -- Mechanical Soft -- Regular -- Multi-consistency -- Pill -- Cervical Esophageal Comment -- Deneise Lever, MS, CCC-SLP Speech-Language Pathologist [phone] No flowsheet data found. Aliene Altes 03/30/2017, 3:39 PM                   Scheduled Meds: . buprenorphine-naloxone  2 tablet Sublingual Daily  . DULoxetine  60 mg Oral BID  . enoxaparin (LOVENOX) injection  40 mg Subcutaneous Q24H  . ferrous sulfate  325 mg Oral TID WC  . gadobenate dimeglumine  15 mL Intravenous Once  . methocarbamol  500 mg Oral TID  . multivitamin with minerals  1 tablet Oral Daily  . nicotine  7 mg Transdermal Q24H  . OLANZapine  5 mg Oral q morning - 10a  . pantoprazole  40 mg Oral BID  . polyethylene glycol  17 g Oral Daily  .  potassium chloride  40 mEq Oral BID  . senna-docusate  1 tablet Oral BID   Continuous Infusions: . sodium chloride    .  ceFAZolin (ANCEF) IV Stopped (03/31/17 0557)     LOS: 59 days    Shirrell Solinger Tanna Furry, MD [facility] Pager [phone]  If 7PM-7AM, please contact night-coverage [email or web address] Password [email or web address] 03/31/2017, 11:30 AM

## 2017-03-31 NOTE — Progress Notes (Signed)
Physical Therapy Treatment Patient Details Name: [name] MRN: [id number] DOB: Jul 28, 1985 Today's Date: 03/31/2017    History of Present Illness Pt is a 32 y.o. female, with past medical history significant for fibromyalgia, anemia and history of IV drug abuse. She presented to the ED with a few days history of fever, chills, and generalized weakness muscle aches in addition to arthralgias.  She was admitted with diagnosis of sepsis.  Pt being treated for endocarditis. 3/11 developed L neck cellulitis.     PT Comments    Pt limited in her mobility by not being able to exit pt room secondary to onset of cellulitis and increased WBC. Due to pt's decreased mobility in last week she has been unable to perform balance HEP activities and requires reminder of activities to improve balance and muscle strength. PT will continue to follow acutely to improve strength and balance for safe mobility in her home environment at d/c.       Follow Up Recommendations  No PT follow up     Equipment Recommendations  None recommended by PT    Recommendations for Other Services       Precautions / Restrictions Precautions Precautions: Fall Restrictions Weight Bearing Restrictions: No    Mobility  Bed Mobility Overal bed mobility: Modified Independent             General bed mobility comments: use of bed rail to pull to EoB  Transfers Overall transfer level: Modified independent Equipment used: None Transfers: Sit to/from Stand Sit to Stand: Modified independent (Device/Increase time)         General transfer comment: pt safe with sit to stand and manages own IV pole  Ambulation/Gait Ambulation/Gait assistance: Supervision Ambulation Distance (Feet): 15 Feet Assistive device: None Gait Pattern/deviations: Step-to pattern;Decreased stride length Gait velocity: WFL with cues   General Gait Details: pt limited to ambulation in room due to additional infection            Balance Overall balance assessment: Mild deficits observed, not formally tested Sitting-balance support: No upper extremity supported;Feet supported Sitting balance-Leahy Scale: Normal     Standing balance support: During functional activity;No upper extremity supported Standing balance-Leahy Scale: Good Standing balance comment: mild balance deficits apparent with bkwd walking, side stepping, modified skipping, and SL stance.  Single Leg Stance - Right Leg: 15 Single Leg Stance - Left Leg: 35 Tandem Stance - Right Leg: 40 Tandem Stance - Left Leg: 60                    Cognition Arousal/Alertness: Awake/alert Behavior During Therapy: WFL for tasks assessed/performed Overall Cognitive Status: Within Functional Limits for tasks assessed                                        Exercises Other Exercises Other Exercises: SL stance working towards regaining 1 min per side Other Exercises: squats 15x Other Exercises: lunges 5 each side    General Comments        Pertinent Vitals/Pain Pain Assessment: Faces Faces Pain Scale: Hurts a little bit Pain Location: generalized, L neck  Pain Descriptors / Indicators: Sore;Grimacing Pain Intervention(s): Limited activity within patient's tolerance;Monitored during session;Repositioned           PT Goals (current goals can now be found in the care plan section) Acute Rehab PT Goals Patient Stated Goal: get better PT  Goal Formulation: With patient Time For Goal Achievement: 03/31/17 Potential to Achieve Goals: Good    Frequency    Min 1X/week      PT Plan Current plan remains appropriate       AM-PAC PT "6 Clicks" Daily Activity  Outcome Measure  Difficulty turning over in bed (including adjusting bedclothes, sheets and blankets)?: None Difficulty moving from lying on back to sitting on the side of the bed? : None Difficulty sitting down on and standing up from a chair with arms (e.g.,  wheelchair, bedside commode, etc,.)?: None Help needed moving to and from a bed to chair (including a wheelchair)?: None Help needed walking in hospital room?: None Help needed climbing 3-5 steps with a railing? : A Little 6 Click Score: 23    End of Session Equipment Utilized During Treatment: Gait belt Activity Tolerance: Patient tolerated treatment well Patient left: in chair;with call bell/phone within reach Nurse Communication: Mobility status PT Visit Diagnosis: Muscle weakness (generalized) (M62.81);Difficulty in walking, not elsewhere classified (R26.2) Pain - Right/Left: Left Pain - part of body: (neck)     Time: [id number] PT Time Calculation (min) (ACUTE ONLY): 20 min  Charges:  $Therapeutic Exercise: 8-22 mins                    G Codes:       [name] PT, DPT Acute Rehabilitation  [phone] Pager [phone]     [name] 03/31/2017, 4:45 PM

## 2017-04-01 LAB — BASIC METABOLIC PANEL
Anion gap: 10 (ref 5–15)
BUN: <5 mg/dL — ABNORMAL LOW (ref 6–20)
CO2: 28 mmol/L (ref 22–32)
Calcium: 8.5 mg/dL — ABNORMAL LOW (ref 8.9–10.3)
Chloride: 98 mmol/L — ABNORMAL LOW (ref 101–111)
Creatinine, Ser: 0.67 mg/dL (ref 0.44–1.00)
Glucose, Bld: 90 mg/dL (ref 65–99)
POTASSIUM: 3.6 mmol/L (ref 3.5–5.1)
SODIUM: 136 mmol/L (ref 135–145)

## 2017-04-01 LAB — CBC
HCT: 28.2 % — ABNORMAL LOW (ref 36.0–46.0)
HEMOGLOBIN: 8.9 g/dL — AB (ref 12.0–15.0)
MCH: 29.2 pg (ref 26.0–34.0)
MCHC: 31.6 g/dL (ref 30.0–36.0)
MCV: 92.5 fL (ref 78.0–100.0)
PLATELETS: 286 10*3/uL (ref 150–400)
RBC: 3.05 MIL/uL — AB (ref 3.87–5.11)
RDW: 19.2 % — ABNORMAL HIGH (ref 11.5–15.5)
WBC: 9.4 10*3/uL (ref 4.0–10.5)

## 2017-04-01 NOTE — Progress Notes (Signed)
On 03/31/17 smelled faint cigarette smell when doing morning assessment. Later in the day patient went to Ultrasound and I was called down to the room around 1130-1200 by nurse tech and environmental coworkers. They wanted me to be aware of the cigarette smell and residue in the bathroom. After patient returned from ultrasound received call from that department stating that their bathroom smelled like smoke and Ms. [name] was the only one using that restroom. Spoke to [name] about smoking and the dangers of around our oxygen here at the hospital. At first she denied but then agreed not to smoke again. This morning I learned during charge report that she had smoked again overnight and her cigarettes were taken out of her room at that time. Made Dr. Ronalee BeltsBhandari aware.

## 2017-04-01 NOTE — Progress Notes (Signed)
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  Patient was not felt to be a candidate for valve replacement at this time due to her ongoing drug use.  She was also noted to have right sternoclavicular septic arthritis.  She was initially maintained on IV nafcillin to complete a total of 6-8 weeks of IV antibiotics as she was not safe to discharge home with PICC line and no facility was willing to accept the patient.  Her course has been complicated by an episode on 03/19/2017 of sepsis physiology with worsening pancytopenia and neutropenia for which hematology was consulted and is following as well as worsening cellulitis and myositis of the left neck and shoulder area.  With her progressive anemia, prior history of GI bleed, and reports of small amounts of blood in her stool and dark stools GI was consulted but felt that patient was not having lower GI bleed.  Evaluated by ENT and brought on antibiotics.  Assessment & Plan:   # MSSA tricuspid valve endocarditis/MSSA bacteremia: -Patient was initially treated on IV nafcillin.  This is discontinued because of suspected cause of bone marrow suppression.  The antibiotics changed to cefepime for 3 days and then to Ancef.  Now counts are improving.  Evaluated by ENT for left neck myositis and cellulitis in the setting of IV drug abuse. ENT recommend to broaden antibiotics.  Added vancomycin IV on 3/13. Re-evaluated by ID Dr. Linus Salmons and discontinue vancomycin.  Recent CT scan showed no drainable fluid collection. -As per infectious disease note from 3/11: Plan to continue antibiotics till April  2.  -as per Dr. Merrilee Jansky note froom 02/27/2017: patient may need TEE after completion of antibiotics. Patient is clinically improving.  Continue current management.  #Left neck cellulitis: No drainable fluid collection on repeat CT scan.  Left neck swelling and erythema is gradually improving.  Patient's swallow is improving and currently on regular diet.  Reevaluated by ENT today, recommended to continue antibiotics and current management.  ENT consult appreciated.    -I have reviewed this information to the patient at bedside today.  Also discussed at length with patient's mother yesterday.  #Chronic pain management, anxiety depression: Continue Cymbalta, oxycodone and Zyprexa.  Added Tylenol.  Discussed with the patient to minimize oxycodone especially while she is on Suboxone.  #Pancytopenia due to bone marrow suppression in the setting of sepsis/endocarditis and possibly with IV nafcillin.  GI was consulted who thought this was not in the setting of active GI bleed.  Bone marrow biopsy suggested secondary changes possibly due to medication.  Hematology consult appreciated.  Monitor CBC.  #Hypokalemia: Potassium level acceptable.  Repeat lab intermittently.  Encourage oral intake.  Potassium level 3.6.  #PICC line placed on 03/21/2017  #History of IV drug abuse currently on Suboxone.  Education provided to the patient  #Asymptomatic bradycardia: EKG reviewed.  Heart rate improved.Marland Kitchen  #Bilateral lower extremities trace edema: Doppler ultrasound negative for DVT.  Encourage ambulation.  Advised to use pillow under the leg while on bed.  She has normal renal function.  DVT prophylaxis: Lovenox subcutaneous Code Status: Full code Family Communication: Discussed with the patient's mother yesterday. Disposition  Plan: Likely discharge home after completion of IV antibiotics, needs to discuss with cardiology during completion of antibiotics for possible need of echo    Consultants:    ENT  GI  Hematology  Cardiology  Procedures: CT neck, bone marrow biopsy, TEE Antimicrobials: IV Ancef 2/-2/8 Zyvox 2/5-2/6, 2/17-/18 IV nafcillin 2/8-3/6 IV cefepime 3/6-3/9 IV cefazolin 3/9 - IV vancomycin 3/13-   Subjective: Seen and examined at bedside.  No new event.  No problem with swallowing.  Denied nausea vomiting chest pain shortness of breath. Objective: Vitals:   03/31/17 1345 03/31/17 2138 04/01/17 0500 04/01/17 0517  BP: 128/70 129/78  (!) 152/92  Pulse: 89 71  79  Resp: '16 18  18  ' Temp: 98.2 F (36.8 C) 98 F (36.7 C)  98.6 F (37 C)  TempSrc: Oral Oral  Oral  SpO2: 98% 93%  96%  Weight: 78.7 kg (173 lb 8 oz)  76 kg (167 lb 8.8 oz)   Height:        Intake/Output Summary (Last 24 hours) at 04/01/2017 1012 Last data filed at 04/01/2017 1008 Gross per 24 hour  Intake 1348 ml  Output -  Net 1348 ml   Filed Weights   03/30/17 0500 03/31/17 1345 04/01/17 0500  Weight: 80.4 kg (177 lb 4 oz) 78.7 kg (173 lb 8 oz) 76 kg (167 lb 8.8 oz)    Examination:  General exam:, Sitting on bed comfortable, not in distress  neck: Left neck swelling has gradual improvement with no erythema. Respiratory system: Clear bilateral, respiratory effort normal Cardiovascular system: Regular rate rhythm S1-S2 normal.  Trace bilateral lower extremities edema. Gastrointestinal system: Abdomen nontender.  Bowel sounds positive.  Soft Central nervous system: Alert awake and following commands. Skin: No rashes, lesions or ulcers Psychiatry: Judgement and insight appear normal. Mood & affect appropriate.     Data Reviewed: I have personally reviewed following labs and imaging studies  CBC: Recent Labs  Lab 03/26/17 0801 03/27/17 0415 04/01/17 0349  WBC 4.1 7.0 9.4  HGB 8.3* 7.9* 8.9*  HCT 23.7* 23.3* 28.2*  MCV 85.6 86.0 92.5  PLT 250 251 808   Basic Metabolic Panel: Recent Labs  Lab 03/26/17 0801 03/27/17 0415 03/28/17 0500 03/29/17 0345 03/30/17 0356  04/01/17 0349  NA 137 135 137 139 138 136  K 2.7* 3.3* 3.0* 2.7* 3.5 3.6  CL 101 101 102 103 103 98*  CO2 '24 23 23 24 26 28  ' GLUCOSE 159* 177* 149* 89 80 90  BUN 13 22* 21* 13 7 <5*  CREATININE 0.69 0.72 0.71 0.67 0.64 0.67  CALCIUM 8.8* 8.7* 8.8* 8.2* 8.2* 8.5*  MG 1.9  --  2.1  --   --   --    GFR: Estimated Creatinine Clearance: 107.4 mL/min (by C-G formula based on SCr of 0.67 mg/dL). Liver Function Tests: No results for input(s): AST, ALT, ALKPHOS, BILITOT, PROT, ALBUMIN in the last 168 hours. No results for input(s): LIPASE, AMYLASE in the last 168 hours. No results for input(s): AMMONIA in the last 168 hours. Coagulation Profile: No results for input(s): INR, PROTIME in the last 168 hours. Cardiac Enzymes: No results for input(s): CKTOTAL, CKMB, CKMBINDEX, TROPONINI in the last 168 hours. BNP (last 3 results) No results for input(s): PROBNP in the last 8760 hours. HbA1C: No results for input(s): HGBA1C in the last 72 hours. CBG: No results for input(s): GLUCAP in the last 168 hours. Lipid Profile: No results for input(s): CHOL, HDL, LDLCALC, TRIG, CHOLHDL, LDLDIRECT  in the last 72 hours. Thyroid Function Tests: No results for input(s): TSH, T4TOTAL, FREET4, T3FREE, THYROIDAB in the last 72 hours. Anemia Panel: No results for input(s): VITAMINB12, FOLATE, FERRITIN, TIBC, IRON, RETICCTPCT in the last 72 hours. Sepsis Labs: No results for input(s): PROCALCITON, LATICACIDVEN in the last 168 hours.  No results found for this or any previous visit (from the past 240 hour(s)).       Radiology Studies: Dg Swallowing Func-speech Pathology  Result Date: 03/30/2017 Objective Swallowing Evaluation: Type of Study: MBS-Modified Barium Swallow Study  Patient Details Name: SHEANNA DAIL MRN: [id number] Date of Birth: Dec 06, 1985 Today's Date: 03/30/2017 Time: SLP Start Time (ACUTE ONLY): 1345 -SLP Stop Time (ACUTE ONLY): 8144 SLP Time Calculation (min) (ACUTE ONLY): 20 min Past  Medical History: Past Medical History: Diagnosis Date . Anemia, unspecified  . Anxiety  . Asthma  . Disc herniation   causes sciatica unsure which disc . Esophageal reflux  . Fatty liver  . Fibromyalgia  . Gastroparesis  . Headache(784.0)  . Heart murmur  . History of narcotic addiction ([place]) 09/30/2012  2010:  Per pt, was addicted to narcotics; mom helped intervene and stopped taking opiods  . Hyperemesis gravidarum with metabolic disturbance, unspecified as to episode of care  . Irritable bowel syndrome  . MSSA bacteremia 02/20/2017 . Other and unspecified noninfectious gastroenteritis and colitis(558.9)  . Pregnant  . PTSD (post-traumatic stress disorder)  . Septic arthritis of right sternoclavicular joint ([place]) 02/20/2017 . Unspecified asthma(493.90)  Past Surgical History: Past Surgical History: Procedure Laterality Date . APPENDECTOMY   . CHOLECYSTECTOMY   . COLONOSCOPY   . LAPAROSCOPIC BILATERAL SALPINGECTOMY Bilateral 12/08/2012  Procedure: LAPAROSCOPIC BILATERAL SALPINGECTOMY;  Surgeon: Jonnie Kind, MD;  Location: [facility];  Service: Gynecology;  Laterality: Bilateral; . MULTIPLE EXTRACTIONS WITH ALVEOLOPLASTY N/A 12/01/2015  Procedure: EXTRACTION TEETH TWO, THREE, FOUR, SIX, SEVEN, EIGHT, NINE, TEN, ELEVEN, TWELVE, FOURTEEN, FIFTEEN, TWENTY, TWENTY ONE, TWENTY EIGHT, TWENTY NINE, THIRTY AND THIRTY ONE WITH ALVEOLOPLASTY;  Surgeon: Diona Browner, DDS;  Location: [facility];  Service: Oral Surgery;  Laterality: N/A; . TEE WITHOUT CARDIOVERSION N/A 02/20/2017  Procedure: TRANSESOPHAGEAL ECHOCARDIOGRAM (TEE);  Surgeon: Larey Dresser, MD;  Location: [facility];  Service: Cardiovascular;  Laterality: N/A; . TUBAL LIGATION   HPI: Pt is a 32 y.o. female, with past medical history significant for fibromyalgia, anemia, GERD, and history of IV drug abuse. She presented to the ED with a few days history of fever, chills, and generalized weakness muscle aches in addition to arthralgias.  She was admitted with diagnosis of  sepsis.  Pt being treated for endocarditis. On 3/6 she developed erythema of her upper chest/L neck. CT showed myositis of L sternocleidomastoid w/ local cellulitis/ inflammation, but no abscess noted. Pt started to have difficulty swallowing and managing her secretions, so SLP and ENT were consulted on 3/12.  Subjective: pt pleasant, alert and conversant Assessment / Plan / Recommendation CHL IP CLINICAL IMPRESSIONS 03/30/2017 Clinical Impression Moderate pharyngeal dysphagia persists, with pharyngeal edema impacting swallow function, specifically epiglottic deflection and laryngeal closure. This results in laryngeal penetration of thin and nectar thick liquids during the swallow, and moderate residue in the valleculae, pyriforms and along the posterior pharyngeal wall. While the amount of penetration is decreased from prior study, there is questionable trace aspiration with nectar and pt is still at risk for aspiration of thin and nectar-thick liquids. The weight of honey thick liquids, purees facilitate epiglottic deflection and therefore pharyngeal clearance, reducing risk for aspiration  after the swallow. With regular solids, there is severe vallecular residue. Compensatory techniques including chin tuck, right and left head turn did not significantly improve pharyngeal clearance. Pt reviewed study results in real time and SLP provided recommendations, rationale. Recommend she continue honey-thick liquids, purees, meds crushed for now, with good prognosis for recovery as edema improves.  SLP Visit Diagnosis Dysphagia, pharyngeal phase (R13.13) Attention and concentration deficit following -- Frontal lobe and executive function deficit following -- Impact on safety and function Moderate aspiration risk;Severe aspiration risk   CHL IP TREATMENT RECOMMENDATION 03/30/2017 Treatment Recommendations Therapy as outlined in treatment plan below;F/U MBS in --- days (Comment)   Prognosis 03/30/2017 Prognosis for Safe Diet  Advancement Good Barriers to Reach Goals -- Barriers/Prognosis Comment -- CHL IP DIET RECOMMENDATION 03/30/2017 SLP Diet Recommendations Dysphagia 1 (Puree) solids;Honey thick liquids Liquid Administration via Cup;Spoon Medication Administration Crushed with puree Compensations Slow rate;Small sips/bites;Multiple dry swallows after each bite/sip Postural Changes --   CHL IP OTHER RECOMMENDATIONS 03/30/2017 Recommended Consults -- Oral Care Recommendations Oral care BID;Oral care before and after PO Other Recommendations Have oral suction available;Remove water pitcher;Prohibited food (jello, ice cream, thin soups);Order thickener from pharmacy   CHL IP FOLLOW UP RECOMMENDATIONS 03/30/2017 Follow up Recommendations Other (comment)   CHL IP FREQUENCY AND DURATION 03/30/2017 Speech Therapy Frequency (ACUTE ONLY) min 3x week Treatment Duration 2 weeks      CHL IP ORAL PHASE 03/30/2017 Oral Phase WFL Oral - Pudding Teaspoon -- Oral - Pudding Cup -- Oral - Honey Teaspoon -- Oral - Honey Cup -- Oral - Nectar Teaspoon -- Oral - Nectar Cup -- Oral - Nectar Straw -- Oral - Thin Teaspoon -- Oral - Thin Cup -- Oral - Thin Straw -- Oral - Puree -- Oral - Mech Soft -- Oral - Regular -- Oral - Multi-Consistency -- Oral - Pill -- Oral Phase - Comment --  CHL IP PHARYNGEAL PHASE 03/30/2017 Pharyngeal Phase Impaired Pharyngeal- Pudding Teaspoon -- Pharyngeal -- Pharyngeal- Pudding Cup -- Pharyngeal -- Pharyngeal- Honey Teaspoon Reduced anterior laryngeal mobility;Reduced laryngeal elevation;Pharyngeal residue - valleculae;Reduced epiglottic inversion;Reduced pharyngeal peristalsis;Pharyngeal residue - pyriform Pharyngeal -- Pharyngeal- Honey Cup Reduced epiglottic inversion;Reduced anterior laryngeal mobility;Reduced laryngeal elevation;Reduced pharyngeal peristalsis;Pharyngeal residue - valleculae;Pharyngeal residue - pyriform Pharyngeal -- Pharyngeal- Nectar Teaspoon Reduced pharyngeal peristalsis;Reduced epiglottic inversion;Reduced  anterior laryngeal mobility;Reduced laryngeal elevation;Reduced airway/laryngeal closure;Penetration/Aspiration during swallow;Penetration/Apiration after swallow;Pharyngeal residue - valleculae;Pharyngeal residue - pyriform;Pharyngeal residue - posterior pharnyx Pharyngeal Material enters airway, remains ABOVE vocal cords and not ejected out Pharyngeal- Nectar Cup Reduced pharyngeal peristalsis;Reduced epiglottic inversion;Reduced anterior laryngeal mobility;Reduced laryngeal elevation;Reduced airway/laryngeal closure;Penetration/Aspiration during swallow;Penetration/Apiration after swallow;Trace aspiration;Pharyngeal residue - valleculae;Pharyngeal residue - pyriform;Pharyngeal residue - posterior pharnyx Pharyngeal Material enters airway, passes BELOW cords without attempt by patient to eject out (silent aspiration) Pharyngeal- Nectar Straw -- Pharyngeal -- Pharyngeal- Thin Teaspoon Reduced pharyngeal peristalsis;Reduced epiglottic inversion;Reduced anterior laryngeal mobility;Reduced laryngeal elevation;Reduced airway/laryngeal closure;Penetration/Aspiration during swallow;Penetration/Apiration after swallow;Pharyngeal residue - valleculae;Pharyngeal residue - pyriform;Pharyngeal residue - posterior pharnyx Pharyngeal Material enters airway, remains ABOVE vocal cords and not ejected out Pharyngeal- Thin Cup Reduced pharyngeal peristalsis;Reduced epiglottic inversion;Reduced anterior laryngeal mobility;Reduced laryngeal elevation;Reduced airway/laryngeal closure;Penetration/Aspiration during swallow;Penetration/Apiration after swallow;Pharyngeal residue - valleculae;Pharyngeal residue - pyriform;Pharyngeal residue - posterior pharnyx Pharyngeal Material enters airway, remains ABOVE vocal cords and not ejected out Pharyngeal- Thin Straw -- Pharyngeal -- Pharyngeal- Puree Reduced pharyngeal peristalsis;Reduced epiglottic inversion;Reduced anterior laryngeal mobility;Reduced laryngeal elevation;Reduced  airway/laryngeal closure;Pharyngeal residue - valleculae;Pharyngeal residue - posterior pharnyx Pharyngeal -- Pharyngeal- Mechanical Soft -- Pharyngeal -- Pharyngeal-  Regular Reduced pharyngeal peristalsis;Reduced epiglottic inversion;Reduced anterior laryngeal mobility;Reduced laryngeal elevation;Reduced airway/laryngeal closure;Pharyngeal residue - valleculae Pharyngeal -- Pharyngeal- Multi-consistency -- Pharyngeal -- Pharyngeal- Pill -- Pharyngeal -- Pharyngeal Comment --  CHL IP CERVICAL ESOPHAGEAL PHASE 03/30/2017 Cervical Esophageal Phase WFL Pudding Teaspoon -- Pudding Cup -- Honey Teaspoon -- Honey Cup -- Nectar Teaspoon -- Nectar Cup -- Nectar Straw -- Thin Teaspoon -- Thin Cup -- Thin Straw -- Puree -- Mechanical Soft -- Regular -- Multi-consistency -- Pill -- Cervical Esophageal Comment -- Deneise Lever, MS, CCC-SLP Speech-Language Pathologist [phone] No flowsheet data found. Aliene Altes 03/30/2017, 3:39 PM                   Scheduled Meds: . buprenorphine-naloxone  2 tablet Sublingual Daily  . DULoxetine  60 mg Oral BID  . enoxaparin (LOVENOX) injection  40 mg Subcutaneous Q24H  . ferrous sulfate  325 mg Oral TID WC  . gadobenate dimeglumine  15 mL Intravenous Once  . methocarbamol  500 mg Oral TID  . multivitamin with minerals  1 tablet Oral Daily  . nicotine  7 mg Transdermal Q24H  . OLANZapine  5 mg Oral q morning - 10a  . pantoprazole  40 mg Oral BID  . polyethylene glycol  17 g Oral Daily  . potassium chloride  40 mEq Oral BID  . senna-docusate  1 tablet Oral BID   Continuous Infusions: . sodium chloride    .  ceFAZolin (ANCEF) IV Stopped (04/01/17 2707)     LOS: 33 days    Dron Tanna Furry, MD [facility] Pager [phone]  If 7PM-7AM, please contact night-coverage [email or web address] Password [email or web address] 04/01/2017, 10:12 AM

## 2017-04-01 NOTE — Progress Notes (Signed)
  Speech Language Pathology Treatment: Dysphagia  Patient Details Name: [name] MRN: [id number] DOB: 02/14/1985 Today's Date: 04/01/2017 Time: [id number] SLP Time Calculation (min) (ACUTE ONLY): 9 min  Assessment / Plan / Recommendation Clinical Impression  Pt recalled one of two swallow strategies independently and demonstrated with minimal-mod verbal cues. Wet vocal quality present prior to po's consumed with therapist. No pharyngeal s/s aspiration with thin or regular texture although pt exhibited decreased sensation to penetrates during MBS. Reiterated importance of strategies given clinical reasoning and referred to primary therapist showing her the video of residue etc. She remains afebrile; RN recorded lung sounds stable. Will follow in a few days to continue education.    HPI HPI: Pt is a 32 y.o. female, with past medical history significant for fibromyalgia, anemia, GERD, and history of IV drug abuse. She presented to the ED with a few days history of fever, chills, and generalized weakness muscle aches in addition to arthralgias.  She was admitted with diagnosis of sepsis.  Pt being treated for endocarditis. On 3/6 she developed erythema of her upper chest/L neck. CT showed myositis of L sternocleidomastoid w/ local cellulitis/ inflammation, but no abscess noted. Pt started to have difficulty swallowing and managing her secretions, so SLP and ENT were consulted on 3/12.      SLP Plan  Continue with current plan of care       Recommendations  Diet recommendations: Regular;Thin liquid Liquids provided via: Cup;Straw Medication Administration: Crushed with puree Supervision: Patient able to self feed;Intermittent supervision to cue for compensatory strategies Compensations: Slow rate;Small sips/bites;Multiple dry swallows after each bite/sip;Clear throat intermittently;Follow solids with liquid Postural Changes and/or Swallow Maneuvers: Seated upright 90 degrees                 Oral Care Recommendations: Oral care BID Follow up Recommendations: None SLP Visit Diagnosis: Dysphagia, unspecified (R13.10) Plan: Continue with current plan of care                       Tammy MacadamiaLitaker, Myrta Mercer [name] 04/01/2017, 2:30 PM  Tammy CoonsLisa [name] Lonell FaceLitaker M.Ed [employer] [name] [phone]

## 2017-04-02 ENCOUNTER — Encounter (HOSPITAL_COMMUNITY): Payer: Self-pay | Admitting: Oncology

## 2017-04-02 DIAGNOSIS — K625 Hemorrhage of anus and rectum: Secondary | ICD-10-CM

## 2017-04-02 NOTE — Progress Notes (Addendum)
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   Patient placed on IV antibiotics and will need to continue IV antibiotics through 04/15/2017.   Patient also found to have worsening pancytopenia, neutropenia.  Hematology was consulted.  Patient also found to have reports of small amounts of bloody dark stools, gastroenterology consulted.  She was also evaluated by ENT as she started having cellulitis and swelling of the left neck and shoulder area.  AdmittedAssessment & Plan   MSSA bacteremia and tricuspid valve endocarditis -Initially placed on IV nafcillin which was discontinued due to suspected bone marrow suppression -Infectious disease consulted and appreciated, recommended antibiotics through 04/15/2017 -currently on Ancef -Cardiology is consulted and appreciated. PN from Dr. Doylene Canard on 02/27/2017 noted the patient may need TEE after completion of antibiotics -Patient has PICC line (placed on 03/21/2017)  Anemia  -patient had bloody/dark stools during this admission -Gastroenterology consulted and appreciated -Suspected anemia multifactorial including possible blood loss versus severe systemic infection versus bone marrow suppression versus antibiotics -Currently no abdominal pain -Given that hemoglobin was stable, gastroenterology signed off.  Did not feel upper or lower endoscopy was not warranted given that there was no overt bleeding.  Recommended continuing PPI twice daily. -hemoglobin currently 8.9, continue to monitor CBC  Left neck cellulitis -CT neck showed no drainable fluid -Currently no erythema noted although patient continues to have edema -ENT, Dr. Blenda Nicely, consulted and appreciated.  Recommended continuing antibiotics per  infectious disease  Right sternoclavivular septic arthritis  -CT surgery recommended outpatient follow in 4 weeks  Chronic pain management/history of IV drug abuse/ anxiety and depression -Continue Cymbalta, oxycodone, Zyprexa, Tylenol as needed -Currently on Suboxone  Pancytopenia -Likely due to bone marrow suppression in the setting of sepsis/endocarditis -Hematology consulted and appreciated, status post bone marrow biopsy-suggested changes secondary to medication -Continue to monitor CBC (appears to be improving)  Hypokalemia -resolved   Asymptomatic bradycardia  -resolved  Left lower extremity edema -Lower extremity Doppler was negative for DVT -Encourage ambulation and elevating leg -Continue to monitor  DVT Prophylaxis  Lovenox  Code Status: Full  Family Communication: None at bedside  Disposition Plan: Admitted. Will continue antibiotics through 4/2. Dispo home after completion of antibiotics  Consultants ENT Gastroenterology Hematology Cardiology  Infectious disease Interventional radiology (bone marrow biopsy)  Procedures  Lower extremity doppler TEE  Antibiotics   Anti-infectives (From admission, onward)   Start     Dose/Rate Route Frequency Ordered Stop   03/26/17 1200  vancomycin (VANCOCIN) IVPB 750 mg/150 ml premix  Status:  Discontinued     750 mg 150 mL/hr over 60 Minutes Intravenous Every 8 hours 03/26/17 1149 03/27/17 1325   03/22/17 1400  ceFAZolin (ANCEF) IVPB 2g/100 mL premix     2 g 200 mL/hr over 30 Minutes Intravenous Every 8 hours 03/22/17 1125 04/15/17 2359   03/19/17 1400  ceFEPIme (MAXIPIME) 2 g in sodium chloride 0.9 % 100 mL IVPB  Status:  Discontinued     2 g 200 mL/hr over 30 Minutes Intravenous Every 8 hours 03/19/17 1357 03/22/17 1125   03/02/17 2200  linezolid (ZYVOX) tablet 600 mg  Status:  Discontinued     600 mg Oral Every 12 hours 03/02/17 2037 03/03/17 1203   02/24/17 1600  nafcillin 2 g  in sodium chloride 0.9 % 100 mL  IVPB  Status:  Discontinued     2 g 200 mL/hr over 30 Minutes Intravenous Every 4 hours 02/24/17 0833 03/19/17 1411   02/21/17 2000  nafcillin 2 g in dextrose 5 % 100 mL IVPB  Status:  Discontinued     2 g 200 mL/hr over 30 Minutes Intravenous Every 4 hours 02/21/17 1741 02/24/17 0910   02/21/17 1730  nafcillin injection 2 g  Status:  Discontinued     2 g Intravenous Every 4 hours 02/21/17 1726 02/21/17 1740   02/19/17 1230  ceFAZolin (ANCEF) IVPB 2g/100 mL premix  Status:  Discontinued     2 g 200 mL/hr over 30 Minutes Intravenous Every 8 hours 02/19/17 1148 02/21/17 1726   02/18/17 1330  linezolid (ZYVOX) tablet 600 mg  Status:  Discontinued     600 mg Oral Every 12 hours 02/18/17 1250 02/19/17 1126   02/17/17 0800  sulfamethoxazole-trimethoprim (BACTRIM DS,SEPTRA DS) 800-160 MG per tablet 2 tablet  Status:  Discontinued     2 tablet Oral Every 12 hours 02/16/17 1453 02/18/17 1250   02/16/17 1600  sulfamethoxazole-trimethoprim (BACTRIM DS,SEPTRA DS) 800-160 MG per tablet 2 tablet     2 tablet Oral  Once 02/16/17 1509 02/16/17 1657   02/14/17 1400  ceFAZolin (ANCEF) IVPB 2g/100 mL premix  Status:  Discontinued     2 g 200 mL/hr over 30 Minutes Intravenous Every 8 hours 02/14/17 1013 02/16/17 1453   02/14/17 0200  vancomycin (VANCOCIN) IVPB 750 mg/150 ml premix  Status:  Discontinued     750 mg 150 mL/hr over 60 Minutes Intravenous Every 8 hours 02/13/17 1739 02/14/17 1017   02/14/17 0000  piperacillin-tazobactam (ZOSYN) IVPB 3.375 g  Status:  Discontinued     3.375 g 12.5 mL/hr over 240 Minutes Intravenous Every 8 hours 02/13/17 1739 02/14/17 1013   02/13/17 1715  piperacillin-tazobactam (ZOSYN) IVPB 3.375 g     3.375 g 100 mL/hr over 30 Minutes Intravenous  Once 02/13/17 1701 02/13/17 1824   02/13/17 1715  vancomycin (VANCOCIN) IVPB 1000 mg/200 mL premix  Status:  Discontinued     1,000 mg 200 mL/hr over 60 Minutes Intravenous  Once 02/13/17 1701 02/13/17 1705   02/13/17 1715   vancomycin (VANCOCIN) 1,500 mg in sodium chloride 0.9 % 500 mL IVPB     1,500 mg 250 mL/hr over 120 Minutes Intravenous  Once 02/13/17 1705 02/13/17 2300      Subjective:   [name] seen and examined today.  Continues to complain of left neck swelling, left leg swelling.  Denies current chest pain, shortness breath, abdominal pain, nausea or vomiting, diarrhea or constipation.  Objective:   Vitals:   04/01/17 1314 04/01/17 2122 04/02/17 0419 04/02/17 0539  BP: 109/71 115/75  (!) 144/90  Pulse: 98 66  86  Resp: '18 18  17  ' Temp: 98.1 F (36.7 C) 98 F (36.7 C)  98.4 F (36.9 C)  TempSrc: Oral Oral  Oral  SpO2: 100% 97%  96%  Weight:   76.4 kg (168 lb 6.9 oz)   Height:        Intake/Output Summary (Last 24 hours) at 04/02/2017 1240 Last data filed at 04/02/2017 0900 Gross per 24 hour  Intake 450 ml  Output -  Net 450 ml   Filed Weights   03/31/17 1345 04/01/17 0500 04/02/17 0419  Weight: 78.7 kg (173 lb 8 oz) 76 kg (167 lb 8.8 oz) 76.4 kg (168 lb  6.9 oz)    Exam  General: Well developed, well nourished, NAD, appears stated age  HEENT: NCAT, mucous membranes moist.   Neck: Supple, left sided edema- TTP  Cardiovascular: S1 S2 auscultated, SEM, RRR  Respiratory: Clear to auscultation bilaterally with equal chest rise  Abdomen: Soft, nontender, nondistended, + bowel sounds  Extremities: warm dry without cyanosis clubbing. LLE edema  Neuro: AAOx3, Nonfocal  Psych: Normal affect and demeanor with intact judgement and insight   Data Reviewed: I have personally reviewed following labs and imaging studies  CBC: Recent Labs  Lab 03/27/17 0415 04/01/17 0349  WBC 7.0 9.4  HGB 7.9* 8.9*  HCT 23.3* 28.2*  MCV 86.0 92.5  PLT 251 267   Basic Metabolic Panel: Recent Labs  Lab 03/27/17 0415 03/28/17 0500 03/29/17 0345 03/30/17 0356 04/01/17 0349  NA 135 137 139 138 136  K 3.3* 3.0* 2.7* 3.5 3.6  CL 101 102 103 103 98*  CO2 '23 23 24 26 28  ' GLUCOSE 177*  149* 89 80 90  BUN 22* 21* 13 7 <5*  CREATININE 0.72 0.71 0.67 0.64 0.67  CALCIUM 8.7* 8.8* 8.2* 8.2* 8.5*  MG  --  2.1  --   --   --    GFR: Estimated Creatinine Clearance: 107.6 mL/min (by C-G formula based on SCr of 0.67 mg/dL). Liver Function Tests: No results for input(s): AST, ALT, ALKPHOS, BILITOT, PROT, ALBUMIN in the last 168 hours. No results for input(s): LIPASE, AMYLASE in the last 168 hours. No results for input(s): AMMONIA in the last 168 hours. Coagulation Profile: No results for input(s): INR, PROTIME in the last 168 hours. Cardiac Enzymes: No results for input(s): CKTOTAL, CKMB, CKMBINDEX, TROPONINI in the last 168 hours. BNP (last 3 results) No results for input(s): PROBNP in the last 8760 hours. HbA1C: No results for input(s): HGBA1C in the last 72 hours. CBG: No results for input(s): GLUCAP in the last 168 hours. Lipid Profile: No results for input(s): CHOL, HDL, LDLCALC, TRIG, CHOLHDL, LDLDIRECT in the last 72 hours. Thyroid Function Tests: No results for input(s): TSH, T4TOTAL, FREET4, T3FREE, THYROIDAB in the last 72 hours. Anemia Panel: No results for input(s): VITAMINB12, FOLATE, FERRITIN, TIBC, IRON, RETICCTPCT in the last 72 hours. Urine analysis:    Component Value Date/Time   COLORURINE AMBER (A) 02/13/2017 0436   APPEARANCEUR HAZY (A) 02/13/2017 0436   LABSPEC 1.014 02/13/2017 0436   PHURINE 5.0 02/13/2017 0436   GLUCOSEU NEGATIVE 02/13/2017 0436   HGBUR LARGE (A) 02/13/2017 0436   BILIRUBINUR NEGATIVE 02/13/2017 0436   KETONESUR NEGATIVE 02/13/2017 0436   PROTEINUR NEGATIVE 02/13/2017 0436   UROBILINOGEN 0.2 12/07/2012 1510   NITRITE POSITIVE (A) 02/13/2017 0436   LEUKOCYTESUR TRACE (A) 02/13/2017 0436   Sepsis Labs: '@LABRCNTIP' (procalcitonin:4,lacticidven:4)  )No results found for this or any previous visit (from the past 240 hour(s)).    Radiology Studies: No results found.   Scheduled Meds: . buprenorphine-naloxone  2 tablet  Sublingual Daily  . DULoxetine  60 mg Oral BID  . enoxaparin (LOVENOX) injection  40 mg Subcutaneous Q24H  . ferrous sulfate  325 mg Oral TID WC  . gadobenate dimeglumine  15 mL Intravenous Once  . methocarbamol  500 mg Oral TID  . multivitamin with minerals  1 tablet Oral Daily  . nicotine  7 mg Transdermal Q24H  . OLANZapine  5 mg Oral q morning - 10a  . pantoprazole  40 mg Oral BID  . polyethylene glycol  17 g Oral Daily  .  potassium chloride  40 mEq Oral BID  . senna-docusate  1 tablet Oral BID   Continuous Infusions: . sodium chloride    .  ceFAZolin (ANCEF) IV Stopped (04/02/17 0631)     LOS: 48 days   Time Spent in minutes   35 minutes  Matina Rodier D.O. on 04/02/2017 at 12:40 PM  Between 7am to 7pm - Pager - [phone]  After 7pm go to [email or web address] - password [email or web address]  And look for the night coverage person covering for me after hours  [facility] Office  [phone]

## 2017-04-03 LAB — CBC
HEMATOCRIT: 30.5 % — AB (ref 36.0–46.0)
HEMOGLOBIN: 9.7 g/dL — AB (ref 12.0–15.0)
MCH: 29.3 pg (ref 26.0–34.0)
MCHC: 31.8 g/dL (ref 30.0–36.0)
MCV: 92.1 fL (ref 78.0–100.0)
PLATELETS: 313 10*3/uL (ref 150–400)
RBC: 3.31 MIL/uL — AB (ref 3.87–5.11)
RDW: 18.4 % — ABNORMAL HIGH (ref 11.5–15.5)
WBC: 10.6 10*3/uL — ABNORMAL HIGH (ref 4.0–10.5)

## 2017-04-03 LAB — BASIC METABOLIC PANEL
ANION GAP: 9 (ref 5–15)
CHLORIDE: 100 mmol/L — AB (ref 101–111)
CO2: 28 mmol/L (ref 22–32)
Calcium: 8.7 mg/dL — ABNORMAL LOW (ref 8.9–10.3)
Creatinine, Ser: 0.69 mg/dL (ref 0.44–1.00)
GFR calc Af Amer: >60 mL/min (ref >60)
GFR calc non Af Amer: >60 mL/min (ref >60)
GLUCOSE: 100 mg/dL — AB (ref 65–99)
POTASSIUM: 3.7 mmol/L (ref 3.5–5.1)
Sodium: 137 mmol/L (ref 135–145)

## 2017-04-03 LAB — TISSUE HYBRIDIZATION (BONE MARROW)-NCBH

## 2017-04-03 LAB — CHROMOSOME ANALYSIS, BONE MARROW

## 2017-04-03 NOTE — Progress Notes (Signed)
PROGRESS NOTE    [name]  OFB:[id number] DOB: [birth year] DOA: 02/13/2017 PCP: Jani Gravel, MD   Brief Narrative:  32 year old female with history of IV drug abuse admitted on 02/13/2017 and found to have MSSA bacteremia secondary to tricuspid valve endocarditis and involvement of PFO.   Patient placed on IV antibiotics and will need to continue IV antibiotics through 04/15/2017.   Patient also found to have worsening pancytopenia, neutropenia.  Hematology was consulted.  Patient also found to have reports of small amounts of bloody dark stools, gastroenterology consulted.  She was also evaluated by ENT as she started having cellulitis and swelling of the left neck and shoulder area.  AdmittedAssessment & Plan   MSSA bacteremia and tricuspid valve endocarditis -Initially placed on IV nafcillin which was discontinued due to suspected bone marrow suppression -Infectious disease consulted and appreciated, recommended antibiotics through 04/15/2017 -currently on Ancef -Cardiology is consulted and appreciated. PN from Dr. Doylene Canard on 02/27/2017 noted the patient may need TEE after completion of antibiotics -Patient has PICC line (placed on 03/21/2017)  Anemia  -patient had bloody/dark stools during this admission -Gastroenterology consulted and appreciated -Suspected anemia multifactorial including possible blood loss versus severe systemic infection versus bone marrow suppression versus antibiotics -Currently no abdominal pain -Given that hemoglobin was stable, gastroenterology signed off.  Did not feel upper or lower endoscopy was not warranted given that there was no overt bleeding.  Recommended continuing PPI twice daily. -hemoglobin currently 9.7, continue to monitor CBC  Left neck cellulitis -CT neck showed no drainable fluid -Currently no erythema noted although patient continues to have edema -ENT, Dr. Blenda Nicely, consulted and appreciated.  Recommended continuing antibiotics per  infectious disease -possibly apply warm compresses -will follow up with ENT on 3/22  Right sternoclavivular septic arthritis  -CT surgery recommended outpatient follow in 4 weeks  Chronic pain management/history of IV drug abuse/ anxiety and depression -Continue Cymbalta, oxycodone, Zyprexa, Tylenol as needed -Currently on Suboxone  Pancytopenia -Likely due to bone marrow suppression in the setting of sepsis/endocarditis -Hematology consulted and appreciated, status post bone marrow biopsy-suggested changes secondary to medication -Continue to monitor CBC (appears to be improving)  Hypokalemia -resolved, continue BMP and replace as needed   Asymptomatic bradycardia  -resolved  Left lower extremity edema -Lower extremity Doppler was negative for DVT -Encourage ambulation and elevating leg -Continue to monitor  DVT Prophylaxis  Lovenox  Code Status: Full  Family Communication: None at bedside  Disposition Plan: Admitted. Will continue antibiotics through 4/2. Dispo home after completion of antibiotics  Consultants ENT Gastroenterology Hematology Cardiology  Infectious disease Interventional radiology (bone marrow biopsy)  Procedures  Lower extremity doppler TEE  Antibiotics   Anti-infectives (From admission, onward)   Start     Dose/Rate Route Frequency Ordered Stop   03/26/17 1200  vancomycin (VANCOCIN) IVPB 750 mg/150 ml premix  Status:  Discontinued     750 mg 150 mL/hr over 60 Minutes Intravenous Every 8 hours 03/26/17 1149 03/27/17 1325   03/22/17 1400  ceFAZolin (ANCEF) IVPB 2g/100 mL premix     2 g 200 mL/hr over 30 Minutes Intravenous Every 8 hours 03/22/17 1125 04/15/17 2359   03/19/17 1400  ceFEPIme (MAXIPIME) 2 g in sodium chloride 0.9 % 100 mL IVPB  Status:  Discontinued     2 g 200 mL/hr over 30 Minutes Intravenous Every 8 hours 03/19/17 1357 03/22/17 1125   03/02/17 2200  linezolid (ZYVOX) tablet 600 mg  Status:  Discontinued     600  mg Oral  Every 12 hours 03/02/17 2037 03/03/17 1203   02/24/17 1600  nafcillin 2 g in sodium chloride 0.9 % 100 mL IVPB  Status:  Discontinued     2 g 200 mL/hr over 30 Minutes Intravenous Every 4 hours 02/24/17 0833 03/19/17 1411   02/21/17 2000  nafcillin 2 g in dextrose 5 % 100 mL IVPB  Status:  Discontinued     2 g 200 mL/hr over 30 Minutes Intravenous Every 4 hours 02/21/17 1741 02/24/17 0910   02/21/17 1730  nafcillin injection 2 g  Status:  Discontinued     2 g Intravenous Every 4 hours 02/21/17 1726 02/21/17 1740   02/19/17 1230  ceFAZolin (ANCEF) IVPB 2g/100 mL premix  Status:  Discontinued     2 g 200 mL/hr over 30 Minutes Intravenous Every 8 hours 02/19/17 1148 02/21/17 1726   02/18/17 1330  linezolid (ZYVOX) tablet 600 mg  Status:  Discontinued     600 mg Oral Every 12 hours 02/18/17 1250 02/19/17 1126   02/17/17 0800  sulfamethoxazole-trimethoprim (BACTRIM DS,SEPTRA DS) 800-160 MG per tablet 2 tablet  Status:  Discontinued     2 tablet Oral Every 12 hours 02/16/17 1453 02/18/17 1250   02/16/17 1600  sulfamethoxazole-trimethoprim (BACTRIM DS,SEPTRA DS) 800-160 MG per tablet 2 tablet     2 tablet Oral  Once 02/16/17 1509 02/16/17 1657   02/14/17 1400  ceFAZolin (ANCEF) IVPB 2g/100 mL premix  Status:  Discontinued     2 g 200 mL/hr over 30 Minutes Intravenous Every 8 hours 02/14/17 1013 02/16/17 1453   02/14/17 0200  vancomycin (VANCOCIN) IVPB 750 mg/150 ml premix  Status:  Discontinued     750 mg 150 mL/hr over 60 Minutes Intravenous Every 8 hours 02/13/17 1739 02/14/17 1017   02/14/17 0000  piperacillin-tazobactam (ZOSYN) IVPB 3.375 g  Status:  Discontinued     3.375 g 12.5 mL/hr over 240 Minutes Intravenous Every 8 hours 02/13/17 1739 02/14/17 1013   02/13/17 1715  piperacillin-tazobactam (ZOSYN) IVPB 3.375 g     3.375 g 100 mL/hr over 30 Minutes Intravenous  Once 02/13/17 1701 02/13/17 1824   02/13/17 1715  vancomycin (VANCOCIN) IVPB 1000 mg/200 mL premix  Status:  Discontinued       1,000 mg 200 mL/hr over 60 Minutes Intravenous  Once 02/13/17 1701 02/13/17 1705   02/13/17 1715  vancomycin (VANCOCIN) 1,500 mg in sodium chloride 0.9 % 500 mL IVPB     1,500 mg 250 mL/hr over 120 Minutes Intravenous  Once 02/13/17 1705 02/13/17 2300      Subjective:   [name] seen and examined today.  Continues to have neck pain. Inquires about when her next TEE will occur. Denies current chest pain, shortness of breath, abdominal pain, N/V/D/C.   Objective:   Vitals:   04/02/17 0539 04/02/17 1400 04/02/17 2100 04/03/17 0500  BP: (!) 144/90 (!) 96/58 121/84 119/82  Pulse: 86 74 83 (!) 104  Resp: '17 16 17 18  ' Temp: 98.4 F (36.9 C) 98 F (36.7 C) 99.1 F (37.3 C) 99.3 F (37.4 C)  TempSrc: Oral Oral Oral Oral  SpO2: 96% 94% 96% 96%  Weight:      Height:        Intake/Output Summary (Last 24 hours) at 04/03/2017 1159 Last data filed at 04/02/2017 2240 Gross per 24 hour  Intake 620 ml  Output -  Net 620 ml   Filed Weights   03/31/17 1345 04/01/17 0500 04/02/17 0419  Weight: 78.7 kg (173 lb 8 oz) 76 kg (167 lb 8.8 oz) 76.4 kg (168 lb 6.9 oz)   Exam  General: Well developed, well nourished, NAD, appears stated age  56: NCAT, mucous membranes moist.   Neck: Supple, left sided edema- mildly TTP  Cardiovascular: S1 S2 auscultated, SEM, RRR  Respiratory: Clear to auscultation bilaterally with equal chest rise  Abdomen: Soft, nontender, nondistended, + bowel sounds  Extremities: warm dry without cyanosis clubbing. LLE edema.   Neuro: AAOx3, nonfocal  Psych: Normal affect and demeanor  Data Reviewed: I have personally reviewed following labs and imaging studies  CBC: Recent Labs  Lab 04/01/17 0349 04/03/17 0424  WBC 9.4 10.6*  HGB 8.9* 9.7*  HCT 28.2* 30.5*  MCV 92.5 92.1  PLT 286 578   Basic Metabolic Panel: Recent Labs  Lab 03/28/17 0500 03/29/17 0345 03/30/17 0356 04/01/17 0349 04/03/17 0424  NA 137 139 138 136 137  K 3.0* 2.7* 3.5  3.6 3.7  CL 102 103 103 98* 100*  CO2 '23 24 26 28 28  ' GLUCOSE 149* 89 80 90 100*  BUN 21* 13 7 <5* <5*  CREATININE 0.71 0.67 0.64 0.67 0.69  CALCIUM 8.8* 8.2* 8.2* 8.5* 8.7*  MG 2.1  --   --   --   --    GFR: Estimated Creatinine Clearance: 107.6 mL/min (by C-G formula based on SCr of 0.69 mg/dL). Liver Function Tests: No results for input(s): AST, ALT, ALKPHOS, BILITOT, PROT, ALBUMIN in the last 168 hours. No results for input(s): LIPASE, AMYLASE in the last 168 hours. No results for input(s): AMMONIA in the last 168 hours. Coagulation Profile: No results for input(s): INR, PROTIME in the last 168 hours. Cardiac Enzymes: No results for input(s): CKTOTAL, CKMB, CKMBINDEX, TROPONINI in the last 168 hours. BNP (last 3 results) No results for input(s): PROBNP in the last 8760 hours. HbA1C: No results for input(s): HGBA1C in the last 72 hours. CBG: No results for input(s): GLUCAP in the last 168 hours. Lipid Profile: No results for input(s): CHOL, HDL, LDLCALC, TRIG, CHOLHDL, LDLDIRECT in the last 72 hours. Thyroid Function Tests: No results for input(s): TSH, T4TOTAL, FREET4, T3FREE, THYROIDAB in the last 72 hours. Anemia Panel: No results for input(s): VITAMINB12, FOLATE, FERRITIN, TIBC, IRON, RETICCTPCT in the last 72 hours. Urine analysis:    Component Value Date/Time   COLORURINE AMBER (A) 02/13/2017 0436   APPEARANCEUR HAZY (A) 02/13/2017 0436   LABSPEC 1.014 02/13/2017 0436   PHURINE 5.0 02/13/2017 0436   GLUCOSEU NEGATIVE 02/13/2017 0436   HGBUR LARGE (A) 02/13/2017 0436   BILIRUBINUR NEGATIVE 02/13/2017 0436   KETONESUR NEGATIVE 02/13/2017 0436   PROTEINUR NEGATIVE 02/13/2017 0436   UROBILINOGEN 0.2 12/07/2012 1510   NITRITE POSITIVE (A) 02/13/2017 0436   LEUKOCYTESUR TRACE (A) 02/13/2017 0436   Sepsis Labs: '@LABRCNTIP' (procalcitonin:4,lacticidven:4)  )No results found for this or any previous visit (from the past 240 hour(s)).    Radiology Studies: No  results found.   Scheduled Meds: . buprenorphine-naloxone  2 tablet Sublingual Daily  . DULoxetine  60 mg Oral BID  . enoxaparin (LOVENOX) injection  40 mg Subcutaneous Q24H  . ferrous sulfate  325 mg Oral TID WC  . gadobenate dimeglumine  15 mL Intravenous Once  . methocarbamol  500 mg Oral TID  . multivitamin with minerals  1 tablet Oral Daily  . nicotine  7 mg Transdermal Q24H  . OLANZapine  5 mg Oral q morning - 10a  . pantoprazole  40  mg Oral BID  . polyethylene glycol  17 g Oral Daily  . potassium chloride  40 mEq Oral BID  . senna-docusate  1 tablet Oral BID   Continuous Infusions: . sodium chloride    .  ceFAZolin (ANCEF) IV Stopped (04/03/17 5320)     LOS: 49 days   Time Spent in minutes   30 minutes  Lillyanne Bradburn D.O. on 04/03/2017 at 11:59 AM  Between 7am to 7pm - Pager - [phone]  After 7pm go to [email or web address] - password [email or web address]  And look for the night coverage person covering for me after hours  [facility] Office  [phone]

## 2017-04-04 NOTE — Progress Notes (Signed)
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   Patient placed on IV antibiotics and will need to continue IV antibiotics through 04/15/2017.   Patient also found to have worsening pancytopenia, neutropenia.  Hematology was consulted.  Patient also found to have reports of small amounts of bloody dark stools, gastroenterology consulted.  She was also evaluated by ENT as she started having cellulitis and swelling of the left neck and shoulder area.  AdmittedAssessment & Plan   MSSA bacteremia and tricuspid valve endocarditis -Initially placed on IV nafcillin which was discontinued due to suspected bone marrow suppression -Infectious disease consulted and appreciated, recommended antibiotics through 04/15/2017 -currently on Ancef -Cardiology is consulted and appreciated. PN from Dr. Doylene Canard on 02/27/2017 noted the patient may need TEE after completion of antibiotics -discussed with Dr. Doylene Canard- he will see the patient again and discuss repeat TEE -Patient has PICC line (placed on 03/21/2017)  Anemia  -patient had bloody/dark stools during this admission -Gastroenterology consulted and appreciated -Suspected anemia multifactorial including possible blood loss versus severe systemic infection versus bone marrow suppression versus antibiotics -Currently no abdominal pain -Given that hemoglobin was stable, gastroenterology signed off.  Did not feel upper or lower endoscopy was not warranted given that there was no overt bleeding.  Recommended continuing PPI twice daily. -hemoglobin currently 9.7, continue to monitor CBC  Left neck cellulitis -CT neck showed no drainable fluid -Currently no erythema noted although patient continues to have edema -ENT, Dr.  Blenda Nicely, consulted and appreciated.  Recommended continuing antibiotics per infectious disease -possibly apply warm compresses -attempted to reach ENT on 3/21 and 3/22- message left (Dr. Blenda Nicely will be back in the office on 3/26)  Right sternoclavivular septic arthritis  -CT surgery recommended outpatient follow in 4 weeks  Chronic pain management/history of IV drug abuse/ anxiety and depression -Continue Cymbalta, oxycodone, Zyprexa, Tylenol as needed -Currently on Suboxone  Pancytopenia -Likely due to bone marrow suppression in the setting of sepsis/endocarditis -Hematology consulted and appreciated, status post bone marrow biopsy-suggested changes secondary to medication -Continue to monitor CBC (appears to be improving)  Hypokalemia -resolved, continue BMP and replace as needed   Asymptomatic bradycardia  -resolved  Left lower extremity edema -Lower extremity Doppler was negative for DVT -Encourage ambulation and elevating leg -Continue to monitor  DVT Prophylaxis  Lovenox  Code Status: Full  Family Communication: None at bedside  Disposition Plan: Admitted. Will continue antibiotics through 4/2. Dispo home after completion of antibiotics  Consultants ENT Gastroenterology Hematology Cardiology  Infectious disease Interventional radiology (bone marrow biopsy)  Procedures  Lower extremity doppler TEE  Antibiotics   Anti-infectives (From admission, onward)   Start     Dose/Rate Route Frequency Ordered Stop   03/26/17 1200  vancomycin (VANCOCIN) IVPB 750 mg/150 ml premix  Status:  Discontinued     750 mg 150 mL/hr over 60 Minutes Intravenous Every 8 hours 03/26/17 1149 03/27/17 1325   03/22/17 1400  ceFAZolin (ANCEF) IVPB 2g/100 mL premix     2 g 200 mL/hr over 30 Minutes Intravenous Every 8 hours 03/22/17 1125 04/15/17 2359   03/19/17 1400  ceFEPIme (MAXIPIME) 2 g in sodium chloride 0.9 % 100 mL IVPB  Status:  Discontinued     2 g 200 mL/hr over 30  Minutes Intravenous Every 8 hours 03/19/17 1357 03/22/17 1125   03/02/17 2200  linezolid (ZYVOX) tablet 600 mg  Status:  Discontinued     600 mg Oral Every 12 hours 03/02/17 2037 03/03/17 1203   02/24/17 1600  nafcillin 2 g in sodium chloride 0.9 % 100 mL IVPB  Status:  Discontinued     2 g 200 mL/hr over 30 Minutes Intravenous Every 4 hours 02/24/17 0833 03/19/17 1411   02/21/17 2000  nafcillin 2 g in dextrose 5 % 100 mL IVPB  Status:  Discontinued     2 g 200 mL/hr over 30 Minutes Intravenous Every 4 hours 02/21/17 1741 02/24/17 0910   02/21/17 1730  nafcillin injection 2 g  Status:  Discontinued     2 g Intravenous Every 4 hours 02/21/17 1726 02/21/17 1740   02/19/17 1230  ceFAZolin (ANCEF) IVPB 2g/100 mL premix  Status:  Discontinued     2 g 200 mL/hr over 30 Minutes Intravenous Every 8 hours 02/19/17 1148 02/21/17 1726   02/18/17 1330  linezolid (ZYVOX) tablet 600 mg  Status:  Discontinued     600 mg Oral Every 12 hours 02/18/17 1250 02/19/17 1126   02/17/17 0800  sulfamethoxazole-trimethoprim (BACTRIM DS,SEPTRA DS) 800-160 MG per tablet 2 tablet  Status:  Discontinued     2 tablet Oral Every 12 hours 02/16/17 1453 02/18/17 1250   02/16/17 1600  sulfamethoxazole-trimethoprim (BACTRIM DS,SEPTRA DS) 800-160 MG per tablet 2 tablet     2 tablet Oral  Once 02/16/17 1509 02/16/17 1657   02/14/17 1400  ceFAZolin (ANCEF) IVPB 2g/100 mL premix  Status:  Discontinued     2 g 200 mL/hr over 30 Minutes Intravenous Every 8 hours 02/14/17 1013 02/16/17 1453   02/14/17 0200  vancomycin (VANCOCIN) IVPB 750 mg/150 ml premix  Status:  Discontinued     750 mg 150 mL/hr over 60 Minutes Intravenous Every 8 hours 02/13/17 1739 02/14/17 1017   02/14/17 0000  piperacillin-tazobactam (ZOSYN) IVPB 3.375 g  Status:  Discontinued     3.375 g 12.5 mL/hr over 240 Minutes Intravenous Every 8 hours 02/13/17 1739 02/14/17 1013   02/13/17 1715  piperacillin-tazobactam (ZOSYN) IVPB 3.375 g     3.375 g 100 mL/hr  over 30 Minutes Intravenous  Once 02/13/17 1701 02/13/17 1824   02/13/17 1715  vancomycin (VANCOCIN) IVPB 1000 mg/200 mL premix  Status:  Discontinued     1,000 mg 200 mL/hr over 60 Minutes Intravenous  Once 02/13/17 1701 02/13/17 1705   02/13/17 1715  vancomycin (VANCOCIN) 1,500 mg in sodium chloride 0.9 % 500 mL IVPB     1,500 mg 250 mL/hr over 120 Minutes Intravenous  Once 02/13/17 1705 02/13/17 2300      Subjective:   [name] seen and examined today.  Continues to have neck pain and swelling on the left sided of her neck, but states the pain is improving. Denies current chest pain, shortness of breath, abdominal pain, N/V/D/C.   Objective:   Vitals:   04/03/17 0500 04/03/17 1509 04/03/17 2155 04/04/17 0541  BP: 119/82 97/65 113/71 (!) 142/85  Pulse: (!) 104 96 79 76  Resp: _0 Temp: 99.3 F (37.4 C) 98.7 F (37.1 C) 98.4 F (36.9 C) 98.1 F (36.7 C)  TempSrc: Oral Oral Oral Oral  SpO2: 96% 98% 96% 95%  Weight:    76.5 kg (168 lb 10.4 oz)  Height:        Intake/Output Summary (Last 24 hours)  at 04/04/2017 1048 Last data filed at 04/03/2017 1427 Gross per 24 hour  Intake 200 ml  Output -  Net 200 ml   Filed Weights   04/01/17 0500 04/02/17 0419 04/04/17 0541  Weight: 76 kg (167 lb 8.8 oz) 76.4 kg (168 lb 6.9 oz) 76.5 kg (168 lb 10.4 oz)   Exam  General: Well developed, well nourished, NAD, appears stated age  56: NCAT, mucous membranes moist.   Neck: Supple, left sided neck edema  Cardiovascular: S1 S2 auscultated, +SEM, RRR  Respiratory: Clear to auscultation bilaterally with equal chest rise  Abdomen: Soft, nontender, nondistended, + bowel sounds  Extremities: warm dry without cyanosis clubbing. LLE edema improving   Neuro: AAOx3, nonfocal  Psych: Appropriate mood and affect  Data Reviewed: I have personally reviewed following labs and imaging studies  CBC: Recent Labs  Lab 04/01/17 0349 04/03/17 0424  WBC 9.4 10.6*  HGB 8.9*  9.7*  HCT 28.2* 30.5*  MCV 92.5 92.1  PLT 286 382   Basic Metabolic Panel: Recent Labs  Lab 03/29/17 0345 03/30/17 0356 04/01/17 0349 04/03/17 0424  NA 139 138 136 137  K 2.7* 3.5 3.6 3.7  CL 103 103 98* 100*  CO2 _0 GLUCOSE 89 80 90 100*  BUN 13 7 <5* <5*  CREATININE 0.67 0.64 0.67 0.69  CALCIUM 8.2* 8.2* 8.5* 8.7*   GFR: Estimated Creatinine Clearance: 107.7 mL/min (by C-G formula based on SCr of 0.69 mg/dL). Liver Function Tests: No results for input(s): AST, ALT, ALKPHOS, BILITOT, PROT, ALBUMIN in the last 168 hours. No results for input(s): LIPASE, AMYLASE in the last 168 hours. No results for input(s): AMMONIA in the last 168 hours. Coagulation Profile: No results for input(s): INR, PROTIME in the last 168 hours. Cardiac Enzymes: No results for input(s): CKTOTAL, CKMB, CKMBINDEX, TROPONINI in the last 168 hours. BNP (last 3 results) No results for input(s): PROBNP in the last 8760 hours. HbA1C: No results for input(s): HGBA1C in the last 72 hours. CBG: No results for input(s): GLUCAP in the last 168 hours. Lipid Profile: No results for input(s): CHOL, HDL, LDLCALC, TRIG, CHOLHDL, LDLDIRECT in the last 72 hours. Thyroid Function Tests: No results for input(s): TSH, T4TOTAL, FREET4, T3FREE, THYROIDAB in the last 72 hours. Anemia Panel: No results for input(s): VITAMINB12, FOLATE, FERRITIN, TIBC, IRON, RETICCTPCT in the last 72 hours. Urine analysis:    Component Value Date/Time   COLORURINE AMBER (A) 02/13/2017 0436   APPEARANCEUR HAZY (A) 02/13/2017 0436   LABSPEC 1.014 02/13/2017 0436   PHURINE 5.0 02/13/2017 0436   GLUCOSEU NEGATIVE 02/13/2017 0436   HGBUR LARGE (A) 02/13/2017 0436   BILIRUBINUR NEGATIVE 02/13/2017 0436   KETONESUR NEGATIVE 02/13/2017 0436   PROTEINUR NEGATIVE 02/13/2017 0436   UROBILINOGEN 0.2 12/07/2012 1510   NITRITE POSITIVE (A) 02/13/2017 0436   LEUKOCYTESUR TRACE (A) 02/13/2017 0436   Sepsis  Labs: _1 (procalcitonin:4,lacticidven:4)  )No results found for this or any previous visit (from the past 240 hour(s)).    Radiology Studies: No results found.   Scheduled Meds: . buprenorphine-naloxone  2 tablet Sublingual Daily  . DULoxetine  60 mg Oral BID  . enoxaparin (LOVENOX) injection  40 mg Subcutaneous Q24H  . ferrous sulfate  325 mg Oral TID WC  . gadobenate dimeglumine  15 mL Intravenous Once  . methocarbamol  500 mg Oral TID  . multivitamin with minerals  1 tablet Oral Daily  . nicotine  7 mg Transdermal Q24H  . OLANZapine  5 mg Oral q morning - 10a  . pantoprazole  40 mg Oral BID  . polyethylene glycol  17 g Oral Daily  . potassium chloride  40 mEq Oral BID  . senna-docusate  1 tablet Oral BID   Continuous Infusions: . sodium chloride    .  ceFAZolin (ANCEF) IV Stopped (04/04/17 7182)     LOS: 50 days   Time Spent in minutes   30 minutes  Jonay Hitchcock D.O. on 04/04/2017 at 10:48 AM  Between 7am to 7pm - Pager - [phone]  After 7pm go to [email or web address] - password [email or web address]  And look for the night coverage person covering for me after hours  [facility] Office  [phone]

## 2017-04-05 LAB — BASIC METABOLIC PANEL
ANION GAP: 7 (ref 5–15)
BUN: <5 mg/dL — ABNORMAL LOW (ref 6–20)
CO2: 28 mmol/L (ref 22–32)
Calcium: 8.6 mg/dL — ABNORMAL LOW (ref 8.9–10.3)
Chloride: 101 mmol/L (ref 101–111)
Creatinine, Ser: 0.64 mg/dL (ref 0.44–1.00)
GFR calc Af Amer: >60 mL/min (ref >60)
GLUCOSE: 101 mg/dL — AB (ref 65–99)
POTASSIUM: 3.6 mmol/L (ref 3.5–5.1)
Sodium: 136 mmol/L (ref 135–145)

## 2017-04-05 LAB — CBC
HCT: 29.1 % — ABNORMAL LOW (ref 36.0–46.0)
HEMOGLOBIN: 9.2 g/dL — AB (ref 12.0–15.0)
MCH: 29.4 pg (ref 26.0–34.0)
MCHC: 31.6 g/dL (ref 30.0–36.0)
MCV: 93 fL (ref 78.0–100.0)
Platelets: 272 10*3/uL (ref 150–400)
RBC: 3.13 MIL/uL — AB (ref 3.87–5.11)
RDW: 17.9 % — ABNORMAL HIGH (ref 11.5–15.5)
WBC: 10 10*3/uL (ref 4.0–10.5)

## 2017-04-05 LAB — MAGNESIUM: Magnesium: 1.8 mg/dL (ref 1.7–2.4)

## 2017-04-05 NOTE — Progress Notes (Signed)
PROGRESS NOTE    [name]  TDH:[id number] DOB: [birth year] DOA: 02/13/2017 PCP: Jani Gravel, MD   Brief Narrative:  32 year old female with history of IV drug abuse admitted on 02/13/2017 and found to have MSSA bacteremia secondary to tricuspid valve endocarditis and involvement of PFO.   Patient placed on IV antibiotics and will need to continue IV antibiotics through 04/15/2017.   Patient also found to have worsening pancytopenia, neutropenia.  Hematology was consulted.  Patient also found to have reports of small amounts of bloody dark stools, gastroenterology consulted.  She was also evaluated by ENT as she started having cellulitis and swelling of the left neck and shoulder area.  AdmittedAssessment & Plan   MSSA bacteremia and tricuspid valve endocarditis -Initially placed on IV nafcillin which was discontinued due to suspected bone marrow suppression -Infectious disease consulted and appreciated, recommended antibiotics through 04/15/2017 -currently on Ancef -Cardiology is consulted and appreciated. PN from Dr. Doylene Canard on 02/27/2017 noted the patient may need TEE after completion of antibiotics -discussed with Dr. Doylene Canard on 3/22- will likely repet TEE prior to discharge -Patient has PICC line (placed on 03/21/2017)  Anemia  -patient had bloody/dark stools during this admission -Gastroenterology consulted and appreciated -Suspected anemia multifactorial including possible blood loss versus severe systemic infection versus bone marrow suppression versus antibiotics -Currently no abdominal pain -Given that hemoglobin was stable, gastroenterology signed off.  Did not feel upper or lower endoscopy was not warranted given that there was no overt bleeding.  Recommended continuing PPI twice daily. -hemoglobin currently 9.2, continue to monitor CBC  Left neck cellulitis -CT neck showed no drainable fluid -Currently no erythema noted although patient continues to have edema -ENT, Dr.  Blenda Nicely, consulted and appreciated.  Recommended continuing antibiotics per infectious disease -possibly apply warm compresses -attempted to reach ENT on 3/21 and 3/22- message left (Dr. Blenda Nicely will be back in the office on 3/26) -patient may need additional imaging   Right sternoclavivular septic arthritis  -CT surgery recommended outpatient follow in 4 weeks  Chronic pain management/history of IV drug abuse/ anxiety and depression -Continue Cymbalta, oxycodone, Zyprexa, Tylenol as needed -Currently on Suboxone  Pancytopenia -Resolved, Likely due to bone marrow suppression in the setting of sepsis/endocarditis -Hematology consulted and appreciated, status post bone marrow biopsy-suggested changes secondary to medication -Continue to monitor CBC (appears to be improving)  Hypokalemia -resolved, continue BMP and replace as needed   Asymptomatic bradycardia  -resolved  Left lower extremity edema -Lower extremity Doppler was negative for DVT -Encourage ambulation and elevating leg -Continue to monitor  DVT Prophylaxis  Lovenox  Code Status: Full  Family Communication: Family at bedside  Disposition Plan: Admitted. Will continue antibiotics through 4/2. Dispo home after completion of antibiotics  Consultants ENT Gastroenterology Hematology Cardiology  Infectious disease Interventional radiology (bone marrow biopsy)  Procedures  Lower extremity doppler TEE  Antibiotics   Anti-infectives (From admission, onward)   Start     Dose/Rate Route Frequency Ordered Stop   03/26/17 1200  vancomycin (VANCOCIN) IVPB 750 mg/150 ml premix  Status:  Discontinued     750 mg 150 mL/hr over 60 Minutes Intravenous Every 8 hours 03/26/17 1149 03/27/17 1325   03/22/17 1400  ceFAZolin (ANCEF) IVPB 2g/100 mL premix     2 g 200 mL/hr over 30 Minutes Intravenous Every 8 hours 03/22/17 1125 04/15/17 2359   03/19/17 1400  ceFEPIme (MAXIPIME) 2 g in sodium chloride 0.9 % 100 mL IVPB   Status:  Discontinued  2 g 200 mL/hr over 30 Minutes Intravenous Every 8 hours 03/19/17 1357 03/22/17 1125   03/02/17 2200  linezolid (ZYVOX) tablet 600 mg  Status:  Discontinued     600 mg Oral Every 12 hours 03/02/17 2037 03/03/17 1203   02/24/17 1600  nafcillin 2 g in sodium chloride 0.9 % 100 mL IVPB  Status:  Discontinued     2 g 200 mL/hr over 30 Minutes Intravenous Every 4 hours 02/24/17 0833 03/19/17 1411   02/21/17 2000  nafcillin 2 g in dextrose 5 % 100 mL IVPB  Status:  Discontinued     2 g 200 mL/hr over 30 Minutes Intravenous Every 4 hours 02/21/17 1741 02/24/17 0910   02/21/17 1730  nafcillin injection 2 g  Status:  Discontinued     2 g Intravenous Every 4 hours 02/21/17 1726 02/21/17 1740   02/19/17 1230  ceFAZolin (ANCEF) IVPB 2g/100 mL premix  Status:  Discontinued     2 g 200 mL/hr over 30 Minutes Intravenous Every 8 hours 02/19/17 1148 02/21/17 1726   02/18/17 1330  linezolid (ZYVOX) tablet 600 mg  Status:  Discontinued     600 mg Oral Every 12 hours 02/18/17 1250 02/19/17 1126   02/17/17 0800  sulfamethoxazole-trimethoprim (BACTRIM DS,SEPTRA DS) 800-160 MG per tablet 2 tablet  Status:  Discontinued     2 tablet Oral Every 12 hours 02/16/17 1453 02/18/17 1250   02/16/17 1600  sulfamethoxazole-trimethoprim (BACTRIM DS,SEPTRA DS) 800-160 MG per tablet 2 tablet     2 tablet Oral  Once 02/16/17 1509 02/16/17 1657   02/14/17 1400  ceFAZolin (ANCEF) IVPB 2g/100 mL premix  Status:  Discontinued     2 g 200 mL/hr over 30 Minutes Intravenous Every 8 hours 02/14/17 1013 02/16/17 1453   02/14/17 0200  vancomycin (VANCOCIN) IVPB 750 mg/150 ml premix  Status:  Discontinued     750 mg 150 mL/hr over 60 Minutes Intravenous Every 8 hours 02/13/17 1739 02/14/17 1017   02/14/17 0000  piperacillin-tazobactam (ZOSYN) IVPB 3.375 g  Status:  Discontinued     3.375 g 12.5 mL/hr over 240 Minutes Intravenous Every 8 hours 02/13/17 1739 02/14/17 1013   02/13/17 1715   piperacillin-tazobactam (ZOSYN) IVPB 3.375 g     3.375 g 100 mL/hr over 30 Minutes Intravenous  Once 02/13/17 1701 02/13/17 1824   02/13/17 1715  vancomycin (VANCOCIN) IVPB 1000 mg/200 mL premix  Status:  Discontinued     1,000 mg 200 mL/hr over 60 Minutes Intravenous  Once 02/13/17 1701 02/13/17 1705   02/13/17 1715  vancomycin (VANCOCIN) 1,500 mg in sodium chloride 0.9 % 500 mL IVPB     1,500 mg 250 mL/hr over 120 Minutes Intravenous  Once 02/13/17 1705 02/13/17 2300      Subjective:   [name] seen and examined today.  Feels neck pain is irmpoving, but continues to have swelling. Denies dizziness, chest pain, shortness of breath, abdominal pain, N/V/D/C.  Objective:   Vitals:   04/04/17 2025 04/05/17 0414 04/05/17 0507 04/05/17 0514  BP: 117/67  135/79 126/82  Pulse: 86  77 85  Resp: _0 Temp: 98.5 F (36.9 C)  98.3 F (36.8 C) 99.1 F (37.3 C)  TempSrc: Oral  Oral Oral  SpO2: 98%  97% 96%  Weight:  76.4 kg (168 lb 6.9 oz)    Height:       No intake or output data in the 24 hours ending 04/05/17 [place]  04/02/17 0419 04/04/17 0541 04/05/17 0414  Weight: 76.4 kg (168 lb 6.9 oz) 76.5 kg (168 lb 10.4 oz) 76.4 kg (168 lb 6.9 oz)   Exam  General: Well developed, well nourished, NAD, appears stated age  10: NCAT, mucous membranes moist.   Neck: Supple, left sided neck edema   Cardiovascular: S1 S2 auscultated, SEM, RRR  Respiratory: Clear to auscultation bilaterally with equal chest rise  Abdomen: Soft, nontender, nondistended, + bowel sounds  Extremities: warm dry without cyanosis clubbing. LLE edema improving.  Neuro: AAOx3, nonfocal  Psych: Appropriate mood and affect  Data Reviewed: I have personally reviewed following labs and imaging studies  CBC: Recent Labs  Lab 04/01/17 0349 04/03/17 0424 04/05/17 0419  WBC 9.4 10.6* 10.0  HGB 8.9* 9.7* 9.2*  HCT 28.2* 30.5* 29.1*  MCV 92.5 92.1 93.0  PLT 286 313 509   Basic  Metabolic Panel: Recent Labs  Lab 03/30/17 0356 04/01/17 0349 04/03/17 0424 04/05/17 0419  NA 138 136 137 136  K 3.5 3.6 3.7 3.6  CL 103 98* 100* 101  CO2 _0 GLUCOSE 80 90 100* 101*  BUN 7 <5* <5* <5*  CREATININE 0.64 0.67 0.69 0.64  CALCIUM 8.2* 8.5* 8.7* 8.6*  MG  --   --   --  1.8   GFR: Estimated Creatinine Clearance: 107.6 mL/min (by C-G formula based on SCr of 0.64 mg/dL). Liver Function Tests: No results for input(s): AST, ALT, ALKPHOS, BILITOT, PROT, ALBUMIN in the last 168 hours. No results for input(s): LIPASE, AMYLASE in the last 168 hours. No results for input(s): AMMONIA in the last 168 hours. Coagulation Profile: No results for input(s): INR, PROTIME in the last 168 hours. Cardiac Enzymes: No results for input(s): CKTOTAL, CKMB, CKMBINDEX, TROPONINI in the last 168 hours. BNP (last 3 results) No results for input(s): PROBNP in the last 8760 hours. HbA1C: No results for input(s): HGBA1C in the last 72 hours. CBG: No results for input(s): GLUCAP in the last 168 hours. Lipid Profile: No results for input(s): CHOL, HDL, LDLCALC, TRIG, CHOLHDL, LDLDIRECT in the last 72 hours. Thyroid Function Tests: No results for input(s): TSH, T4TOTAL, FREET4, T3FREE, THYROIDAB in the last 72 hours. Anemia Panel: No results for input(s): VITAMINB12, FOLATE, FERRITIN, TIBC, IRON, RETICCTPCT in the last 72 hours. Urine analysis:    Component Value Date/Time   COLORURINE AMBER (A) 02/13/2017 0436   APPEARANCEUR HAZY (A) 02/13/2017 0436   LABSPEC 1.014 02/13/2017 0436   PHURINE 5.0 02/13/2017 0436   GLUCOSEU NEGATIVE 02/13/2017 0436   HGBUR LARGE (A) 02/13/2017 0436   BILIRUBINUR NEGATIVE 02/13/2017 0436   KETONESUR NEGATIVE 02/13/2017 0436   PROTEINUR NEGATIVE 02/13/2017 0436   UROBILINOGEN 0.2 12/07/2012 1510   NITRITE POSITIVE (A) 02/13/2017 0436   LEUKOCYTESUR TRACE (A) 02/13/2017 0436   Sepsis Labs: _1 (procalcitonin:4,lacticidven:4)  )No results  found for this or any previous visit (from the past 240 hour(s)).    Radiology Studies: No results found.   Scheduled Meds: . buprenorphine-naloxone  2 tablet Sublingual Daily  . DULoxetine  60 mg Oral BID  . enoxaparin (LOVENOX) injection  40 mg Subcutaneous Q24H  . ferrous sulfate  325 mg Oral TID WC  . gadobenate dimeglumine  15 mL Intravenous Once  . methocarbamol  500 mg Oral TID  . multivitamin with minerals  1 tablet Oral Daily  . nicotine  7 mg Transdermal Q24H  . OLANZapine  5 mg Oral q morning - 10a  . pantoprazole  40 mg Oral BID  . polyethylene glycol  17 g Oral Daily  . potassium chloride  40 mEq Oral BID  . senna-docusate  1 tablet Oral BID   Continuous Infusions: . sodium chloride    .  ceFAZolin (ANCEF) IV Stopped (04/05/17 0552)     LOS: 51 days   Time Spent in minutes   30 minutes  Wellington Winegarden D.O. on 04/05/2017 at 11:25 AM  Between 7am to 7pm - Pager - [phone]  After 7pm go to [email or web address] - password [email or web address]  And look for the night coverage person covering for me after hours  [facility] Office  [phone]

## 2017-04-05 NOTE — Progress Notes (Signed)
Patient disconnected IV fluid line from PICC. RN educated patient on importance of Comptrollercalling RN for line disconnection. Patient nodded. IV team paged to flush PICC. Will continue to educate and monitor.

## 2017-04-06 DIAGNOSIS — R102 Pelvic and perineal pain: Secondary | ICD-10-CM

## 2017-04-06 MED ORDER — DEXAMETHASONE SODIUM PHOSPHATE 10 MG/ML IJ SOLN
10.0000 mg | INTRAMUSCULAR | Status: AC
  Administered 2017-04-06 – 2017-04-08 (×3): 10 mg via INTRAVENOUS
  Filled 2017-04-06 (×3): qty 1

## 2017-04-06 NOTE — Progress Notes (Signed)
PROGRESS NOTE    [name]  NFA:[id number] DOB: 12/12/1985 DOA: 02/13/2017 PCP: Jani Gravel, MD   Brief Narrative:  32 year old female with history of IV drug abuse admitted on 02/13/2017 and found to have MSSA bacteremia secondary to tricuspid valve endocarditis and involvement of PFO.   Patient placed on IV antibiotics and will need to continue IV antibiotics through 04/15/2017.   Patient also found to have worsening pancytopenia, neutropenia.  Hematology was consulted.  Patient also found to have reports of small amounts of bloody dark stools, gastroenterology consulted.  She was also evaluated by ENT as she started having cellulitis and swelling of the left neck and shoulder area.  AdmittedAssessment & Plan   MSSA bacteremia and tricuspid valve endocarditis -Initially placed on IV nafcillin which was discontinued due to suspected bone marrow suppression -Infectious disease consulted and appreciated, recommended antibiotics through 04/15/2017 -currently on Ancef -Cardiology is consulted and appreciated. PN from Dr. Doylene Canard on 02/27/2017 noted the patient may need TEE after completion of antibiotics -discussed with Dr. Doylene Canard on 3/22- will likely repet TEE prior to discharge -Patient has PICC line (placed on 03/21/2017)  Anemia  -patient had bloody/dark stools during this admission -Gastroenterology consulted and appreciated -Suspected anemia multifactorial including possible blood loss versus severe systemic infection versus bone marrow suppression versus antibiotics -Currently no abdominal pain -Given that hemoglobin was stable, gastroenterology signed off.  Did not feel upper or lower endoscopy was not warranted given that there was no overt bleeding.  Recommended continuing PPI twice daily. -hemoglobin currently 9.2, continue to monitor CBC  Left neck cellulitis -CT neck showed no drainable fluid -Currently no erythema noted although patient continues to have edema -ENT, Dr.  Blenda Nicely, consulted and appreciated.  Recommended continuing antibiotics per infectious disease -possibly apply warm compresses -attempted to reach ENT on 3/21 and 3/22- message left (Dr. Blenda Nicely will be back in the office on 3/26) -patient may need additional imaging  -will add on decadron (as this helped earlier this month)  Right sternoclavivular septic arthritis  -CT surgery recommended outpatient follow in 4 weeks  Chronic pain management/history of IV drug abuse/ anxiety and depression -Continue Cymbalta, oxycodone, Zyprexa, Tylenol as needed -Currently on Suboxone  Pancytopenia -Resolved, Likely due to bone marrow suppression in the setting of sepsis/endocarditis -Hematology consulted and appreciated, status post bone marrow biopsy-suggested changes secondary to medication -Continue to monitor CBC (appears to be improving)  Hypokalemia -resolved, continue BMP and replace as needed   Asymptomatic bradycardia  -resolved  Left lower extremity edema -Lower extremity Doppler was negative for DVT -Encourage ambulation and elevating leg -Continue to monitor  Tobacco abuse -has nicotine patch -smelled cigarette smoke in the room and patient denied smoking -smoking cessation discussed  DVT Prophylaxis  Lovenox  Code Status: Full  Family Communication: None at bedside  Disposition Plan: Admitted. Will continue antibiotics through 4/2. Dispo home after completion of antibiotics  Consultants ENT Gastroenterology Hematology Cardiology  Infectious disease Interventional radiology (bone marrow biopsy)  Procedures  Lower extremity doppler TEE  Antibiotics   Anti-infectives (From admission, onward)   Start     Dose/Rate Route Frequency Ordered Stop   03/26/17 1200  vancomycin (VANCOCIN) IVPB 750 mg/150 ml premix  Status:  Discontinued     750 mg 150 mL/hr over 60 Minutes Intravenous Every 8 hours 03/26/17 1149 03/27/17 1325   03/22/17 1400  ceFAZolin (ANCEF)  IVPB 2g/100 mL premix     2 g 200 mL/hr over 30 Minutes Intravenous Every 8 hours  03/22/17 1125 04/15/17 2359   03/19/17 1400  ceFEPIme (MAXIPIME) 2 g in sodium chloride 0.9 % 100 mL IVPB  Status:  Discontinued     2 g 200 mL/hr over 30 Minutes Intravenous Every 8 hours 03/19/17 1357 03/22/17 1125   03/02/17 2200  linezolid (ZYVOX) tablet 600 mg  Status:  Discontinued     600 mg Oral Every 12 hours 03/02/17 2037 03/03/17 1203   02/24/17 1600  nafcillin 2 g in sodium chloride 0.9 % 100 mL IVPB  Status:  Discontinued     2 g 200 mL/hr over 30 Minutes Intravenous Every 4 hours 02/24/17 0833 03/19/17 1411   02/21/17 2000  nafcillin 2 g in dextrose 5 % 100 mL IVPB  Status:  Discontinued     2 g 200 mL/hr over 30 Minutes Intravenous Every 4 hours 02/21/17 1741 02/24/17 0910   02/21/17 1730  nafcillin injection 2 g  Status:  Discontinued     2 g Intravenous Every 4 hours 02/21/17 1726 02/21/17 1740   02/19/17 1230  ceFAZolin (ANCEF) IVPB 2g/100 mL premix  Status:  Discontinued     2 g 200 mL/hr over 30 Minutes Intravenous Every 8 hours 02/19/17 1148 02/21/17 1726   02/18/17 1330  linezolid (ZYVOX) tablet 600 mg  Status:  Discontinued     600 mg Oral Every 12 hours 02/18/17 1250 02/19/17 1126   02/17/17 0800  sulfamethoxazole-trimethoprim (BACTRIM DS,SEPTRA DS) 800-160 MG per tablet 2 tablet  Status:  Discontinued     2 tablet Oral Every 12 hours 02/16/17 1453 02/18/17 1250   02/16/17 1600  sulfamethoxazole-trimethoprim (BACTRIM DS,SEPTRA DS) 800-160 MG per tablet 2 tablet     2 tablet Oral  Once 02/16/17 1509 02/16/17 1657   02/14/17 1400  ceFAZolin (ANCEF) IVPB 2g/100 mL premix  Status:  Discontinued     2 g 200 mL/hr over 30 Minutes Intravenous Every 8 hours 02/14/17 1013 02/16/17 1453   02/14/17 0200  vancomycin (VANCOCIN) IVPB 750 mg/150 ml premix  Status:  Discontinued     750 mg 150 mL/hr over 60 Minutes Intravenous Every 8 hours 02/13/17 1739 02/14/17 1017   02/14/17 0000   piperacillin-tazobactam (ZOSYN) IVPB 3.375 g  Status:  Discontinued     3.375 g 12.5 mL/hr over 240 Minutes Intravenous Every 8 hours 02/13/17 1739 02/14/17 1013   02/13/17 1715  piperacillin-tazobactam (ZOSYN) IVPB 3.375 g     3.375 g 100 mL/hr over 30 Minutes Intravenous  Once 02/13/17 1701 02/13/17 1824   02/13/17 1715  vancomycin (VANCOCIN) IVPB 1000 mg/200 mL premix  Status:  Discontinued     1,000 mg 200 mL/hr over 60 Minutes Intravenous  Once 02/13/17 1701 02/13/17 1705   02/13/17 1715  vancomycin (VANCOCIN) 1,500 mg in sodium chloride 0.9 % 500 mL IVPB     1,500 mg 250 mL/hr over 120 Minutes Intravenous  Once 02/13/17 1705 02/13/17 2300      Subjective:   [name] seen and examined today.  Continues to complain of neck swelling. Denies current chest pain, shortness of breath, abdominal pain, nausea or vomiting, diarrhea or constipation, dizziness or headache.   Objective:   Vitals:   04/05/17 1329 04/05/17 2128 04/06/17 0317 04/06/17 0606  BP: 94/61 99/70  124/71  Pulse: (!) 109 92  78  Resp:  20  18  Temp: 98.4 F (36.9 C) 98.4 F (36.9 C)  98.1 F (36.7 C)  TempSrc: Oral     SpO2: 99% 97%  96%  Weight:   76.6 kg (168 lb 14 oz)   Height:       No intake or output data in the 24 hours ending 04/06/17 1045 Filed Weights   04/04/17 0541 04/05/17 0414 04/06/17 0317  Weight: 76.5 kg (168 lb 10.4 oz) 76.4 kg (168 lb 6.9 oz) 76.6 kg (168 lb 14 oz)   Exam  General: Well developed, well nourished, NAD, appears stated age  4: NCAT,  mucous membranes moist.   Neck: Supple, left sided neck edema, Mildly TTP  Cardiovascular: S1 S2 auscultated, RRR, +SEM  Respiratory: Clear to auscultation bilaterally with equal chest rise  Abdomen: Soft, nontender, nondistended, + bowel sounds  Extremities: warm dry without cyanosis clubbing or edema  Neuro: AAOx3, nonfocal  Skin: Without rashes exudates or nodules  Psych: Normal affect and demeanor with intact  judgement and insight  Data Reviewed: I have personally reviewed following labs and imaging studies  CBC: Recent Labs  Lab 04/01/17 0349 04/03/17 0424 04/05/17 0419  WBC 9.4 10.6* 10.0  HGB 8.9* 9.7* 9.2*  HCT 28.2* 30.5* 29.1*  MCV 92.5 92.1 93.0  PLT 286 313 401   Basic Metabolic Panel: Recent Labs  Lab 04/01/17 0349 04/03/17 0424 04/05/17 0419  NA 136 137 136  K 3.6 3.7 3.6  CL 98* 100* 101  CO2 '28 28 28  ' GLUCOSE 90 100* 101*  BUN <5* <5* <5*  CREATININE 0.67 0.69 0.64  CALCIUM 8.5* 8.7* 8.6*  MG  --   --  1.8   GFR: Estimated Creatinine Clearance: 107.7 mL/min (by C-G formula based on SCr of 0.64 mg/dL). Liver Function Tests: No results for input(s): AST, ALT, ALKPHOS, BILITOT, PROT, ALBUMIN in the last 168 hours. No results for input(s): LIPASE, AMYLASE in the last 168 hours. No results for input(s): AMMONIA in the last 168 hours. Coagulation Profile: No results for input(s): INR, PROTIME in the last 168 hours. Cardiac Enzymes: No results for input(s): CKTOTAL, CKMB, CKMBINDEX, TROPONINI in the last 168 hours. BNP (last 3 results) No results for input(s): PROBNP in the last 8760 hours. HbA1C: No results for input(s): HGBA1C in the last 72 hours. CBG: No results for input(s): GLUCAP in the last 168 hours. Lipid Profile: No results for input(s): CHOL, HDL, LDLCALC, TRIG, CHOLHDL, LDLDIRECT in the last 72 hours. Thyroid Function Tests: No results for input(s): TSH, T4TOTAL, FREET4, T3FREE, THYROIDAB in the last 72 hours. Anemia Panel: No results for input(s): VITAMINB12, FOLATE, FERRITIN, TIBC, IRON, RETICCTPCT in the last 72 hours. Urine analysis:    Component Value Date/Time   COLORURINE AMBER (A) 02/13/2017 0436   APPEARANCEUR HAZY (A) 02/13/2017 0436   LABSPEC 1.014 02/13/2017 0436   PHURINE 5.0 02/13/2017 0436   GLUCOSEU NEGATIVE 02/13/2017 0436   HGBUR LARGE (A) 02/13/2017 0436   BILIRUBINUR NEGATIVE 02/13/2017 0436   KETONESUR NEGATIVE  02/13/2017 0436   PROTEINUR NEGATIVE 02/13/2017 0436   UROBILINOGEN 0.2 12/07/2012 1510   NITRITE POSITIVE (A) 02/13/2017 0436   LEUKOCYTESUR TRACE (A) 02/13/2017 0436   Sepsis Labs: '@LABRCNTIP' (procalcitonin:4,lacticidven:4)  )No results found for this or any previous visit (from the past 240 hour(s)).    Radiology Studies: No results found.   Scheduled Meds: . buprenorphine-naloxone  2 tablet Sublingual Daily  . dexamethasone  10 mg Intravenous Q24H  . DULoxetine  60 mg Oral BID  . enoxaparin (LOVENOX) injection  40 mg Subcutaneous Q24H  . ferrous sulfate  325 mg Oral TID WC  . gadobenate dimeglumine  15 mL Intravenous  Once  . methocarbamol  500 mg Oral TID  . multivitamin with minerals  1 tablet Oral Daily  . nicotine  7 mg Transdermal Q24H  . OLANZapine  5 mg Oral q morning - 10a  . pantoprazole  40 mg Oral BID  . polyethylene glycol  17 g Oral Daily  . potassium chloride  40 mEq Oral BID  . senna-docusate  1 tablet Oral BID   Continuous Infusions: . sodium chloride    .  ceFAZolin (ANCEF) IV Stopped (04/06/17 8592)     LOS: 52 days   Time Spent in minutes   30 minutes  Tryton Bodi D.O. on 04/06/2017 at 10:45 AM  Between 7am to 7pm - Pager - [phone]  After 7pm go to [email or web address] - password [email or web address]  And look for the night coverage person covering for me after hours  [facility] Office  [phone]

## 2017-04-06 NOTE — Progress Notes (Signed)
IV Team consult to flush and cap PICC line.  Upon entering room, pt states she had already disconnected PICC line and it just needed to be flushed.  Instructed pt that only staff to disconnect.  Pt states she has been told by "everybody else" that it is ok for her to do it herself.  Reinforced only staff RN to disconnect.

## 2017-04-06 NOTE — Progress Notes (Signed)
Informed that patient disconnected PICC line again.  Education reinforced.  Patient has been told several times by several different nurses to not do this.  Patient was informed earlier that IV team would be coming to disconnect her when her antibiotic was finished, in which patient verbalized an understanding.  Will continue to reinforce education.

## 2017-04-07 LAB — BASIC METABOLIC PANEL
ANION GAP: 10 (ref 5–15)
BUN: 6 mg/dL (ref 6–20)
CALCIUM: 9.4 mg/dL (ref 8.9–10.3)
CO2: 27 mmol/L (ref 22–32)
Chloride: 100 mmol/L — ABNORMAL LOW (ref 101–111)
Creatinine, Ser: 0.64 mg/dL (ref 0.44–1.00)
GFR calc Af Amer: >60 mL/min (ref >60)
GFR calc non Af Amer: >60 mL/min (ref >60)
GLUCOSE: 190 mg/dL — AB (ref 65–99)
Potassium: 4.4 mmol/L (ref 3.5–5.1)
Sodium: 137 mmol/L (ref 135–145)

## 2017-04-07 LAB — CBC
HEMATOCRIT: 30.2 % — AB (ref 36.0–46.0)
Hemoglobin: 9.7 g/dL — ABNORMAL LOW (ref 12.0–15.0)
MCH: 29.5 pg (ref 26.0–34.0)
MCHC: 32.1 g/dL (ref 30.0–36.0)
MCV: 91.8 fL (ref 78.0–100.0)
PLATELETS: 275 10*3/uL (ref 150–400)
RBC: 3.29 MIL/uL — ABNORMAL LOW (ref 3.87–5.11)
RDW: 17.4 % — AB (ref 11.5–15.5)
WBC: 12.6 10*3/uL — ABNORMAL HIGH (ref 4.0–10.5)

## 2017-04-07 NOTE — Patient Instructions (Signed)
Backward    Walk backwards with eyes open. Take even steps, making sure each foot lifts off floor. Repeat for __40__ feet per session. Do __2__ sessions per day.   Copyright  VHI. All rights reserved.  Side-Stepping    Walk to left side with eyes open. Take even steps, leading with same foot. Make sure each foot lifts off the floor. Repeat in opposite direction. Repeat for 40 feet per session. Do __2__ sessions per day.   Copyright  VHI. All rights reserved.  Feet Heel-Toe "Tandem"    Arms outstretched, walk a straight line bringing one foot directly in front of the other. Repeat for 40 feet per session. Do __2__ sessions per day.  Copyright  VHI. All rights reserved.  Single Leg - Eyes Open    Holding support, lift right leg while maintaining balance over other leg. Progress to removing hands from support surface for longer periods of time. Hold_10-30___ seconds. Repeat __2__ times per session. Do __2__ sessions per day.  Copyright  VHI. All rights reserved.  Sit to Stand / Stand to Sit / Transfers    Sit on edge of a solid chair with arms, feet flat on floor. Lean forward over feet and stand up without using hands on chair arms. Sit down slowly without using hands on chair arms. Repeat _5___ times per session. Do __3__ sessions per day.  Copyright  VHI. All rights reserved.

## 2017-04-07 NOTE — Progress Notes (Signed)
Went in pt room smelled like cigarettes.  Pt denies smoking. In room.

## 2017-04-07 NOTE — Progress Notes (Signed)
Physical Therapy Treatment Patient Details Name: [name] MRN: [id number] DOB: Mar 10, 1985 Today's Date: 04/07/2017    History of Present Illness Pt is a 32 y.o. female, with past medical history significant for fibromyalgia, anemia and history of IV drug abuse. She presented to the ED with a few days history of fever, chills, and generalized weakness muscle aches in addition to arthralgias.  She was admitted with diagnosis of sepsis.  Pt being treated for endocarditis. 3/11 developed L neck cellulitis.     PT Comments    Patient progressing with mobility and HEP issued formally this session with handout.  Feel she is progressing, though not performing activities much on her own to improve balance, etc.  Will check back once more prior to d/c next week to ensure HEP activities still appropriate level of challenge for her.  Goals updated this session.    Follow Up Recommendations  No PT follow up     Equipment Recommendations  None recommended by PT    Recommendations for Other Services       Precautions / Restrictions Precautions Precautions: Fall    Mobility  Bed Mobility Overal bed mobility: Modified Independent                Transfers Overall transfer level: Modified independent                  Ambulation/Gait Ambulation/Gait assistance: Independent Ambulation Distance (Feet): 200 Feet Assistive device: None Gait Pattern/deviations: Step-through pattern;Decreased stride length;Drifts right/left         Stairs Stairs: Yes   Stair Management: Alternating pattern;Forwards;One rail Right Number of Stairs: 3 General stair comments: reports heavy legs making it difficult  Wheelchair Mobility    Modified Rankin (Stroke Patients Only)       Balance Overall balance assessment: Modified Independent               Single Leg Stance - Right Leg: 20 Single Leg Stance - Left Leg: 30         High level balance activites: Side  stepping;Backward walking High Level Balance Comments: issued HEP for balance and LE strength with sit to stand            Cognition Arousal/Alertness: Awake/alert Behavior During Therapy: WFL for tasks assessed/performed Overall Cognitive Status: Within Functional Limits for tasks assessed                                        Exercises Other Exercises Other Exercises: sit<>stand no UE support x 5    General Comments        Pertinent Vitals/Pain Faces Pain Scale: No hurt    Home Living                      Prior Function            PT Goals (current goals can now be found in the care plan section) Acute Rehab PT Goals Time For Goal Achievement: 04/18/17 Potential to Achieve Goals: Good Progress towards PT goals: Goals met and updated - see care plan    Frequency    Min 1X/week      PT Plan Current plan remains appropriate    Co-evaluation              AM-PAC PT "6 Clicks" Daily Activity  Outcome Measure  Difficulty turning over  in bed (including adjusting bedclothes, sheets and blankets)?: None Difficulty moving from lying on back to sitting on the side of the bed? : None Difficulty sitting down on and standing up from a chair with arms (e.g., wheelchair, bedside commode, etc,.)?: None Help needed moving to and from a bed to chair (including a wheelchair)?: None Help needed walking in hospital room?: None Help needed climbing 3-5 steps with a railing? : A Little 6 Click Score: 23    End of Session   Activity Tolerance: Patient tolerated treatment well Patient left: Other (comment)(in room able to access needs)   PT Visit Diagnosis: Muscle weakness (generalized) (M62.81);Difficulty in walking, not elsewhere classified (R26.2)     Time: 8341-9622 PT Time Calculation (min) (ACUTE ONLY): 15 min  Charges:  $Therapeutic Activity: 8-22 mins                    G CodesMagda [name],  [name] [phone] 04/07/2017    [name] 04/07/2017, 5:10 PM

## 2017-04-07 NOTE — Progress Notes (Signed)
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   Patient placed on IV antibiotics and will need to continue IV antibiotics through 04/15/2017.   Patient also found to have worsening pancytopenia, neutropenia.  Hematology was consulted.  Patient also found to have reports of small amounts of bloody dark stools, gastroenterology consulted.  She was also evaluated by ENT as she started having cellulitis and swelling of the left neck and shoulder area.  04/07/2017: Patient seen.  No new complaints.  AdmittedAssessment & Plan   MSSA bacteremia and tricuspid valve endocarditis -Initially placed on IV nafcillin which was discontinued due to suspected bone marrow suppression -Infectious disease consulted and appreciated, recommended antibiotics through 04/15/2017 -currently on Ancef -Cardiology is consulted and appreciated. PN from Dr. Doylene Canard on 02/27/2017 noted the patient may need TEE after completion of antibiotics -discussed with Dr. Doylene Canard on 3/22- will likely repet TEE prior to discharge -Patient has PICC line (placed on 03/21/2017)  Anemia  -patient had bloody/dark stools during this admission -Gastroenterology consulted and appreciated -Suspected anemia multifactorial including possible blood loss versus severe systemic infection versus bone marrow suppression versus antibiotics -Currently no abdominal pain -Given that hemoglobin was stable, gastroenterology signed off.  Did not feel upper or lower endoscopy was not warranted given that there was no overt bleeding.  Recommended continuing PPI twice daily. -hemoglobin currently 9.2, continue to monitor CBC  Left neck cellulitis -CT neck showed no drainable fluid -Currently no erythema noted although  patient continues to have edema -ENT, Dr. Blenda Nicely, consulted and appreciated.  Recommended continuing antibiotics per infectious disease -possibly apply warm compresses -attempted to reach ENT on 3/21 and 3/22- message left (Dr. Blenda Nicely will be back in the office on 3/26) -patient may need additional imaging   Right sternoclavivular septic arthritis  -CT surgery recommended outpatient follow in 4 weeks  Chronic pain management/history of IV drug abuse/ anxiety and depression -Continue Cymbalta, oxycodone, Zyprexa, Tylenol as needed -Currently on Suboxone  Pancytopenia -Resolved, Likely due to bone marrow suppression in the setting of sepsis/endocarditis -Hematology consulted and appreciated, status post bone marrow biopsy-suggested changes secondary to medication -Continue to monitor CBC (appears to be improving)  Hypokalemia -resolved, continue BMP and replace as needed   Asymptomatic bradycardia  -resolved  Left lower extremity edema -Lower extremity Doppler was negative for DVT -Encourage ambulation and elevating leg -Continue to monitor  DVT Prophylaxis  Lovenox  Code Status: Full  Family Communication: Family at bedside  Disposition Plan: Admitted. Will continue antibiotics through 4/2. Dispo home after completion of antibiotics  Consultants ENT Gastroenterology Hematology Cardiology  Infectious disease Interventional radiology (bone marrow biopsy)  Procedures  Lower extremity doppler TEE  Antibiotics   Anti-infectives (From admission, onward)   Start     Dose/Rate Route Frequency Ordered Stop   03/26/17 1200  vancomycin (VANCOCIN) IVPB 750 mg/150 ml premix  Status:  Discontinued     750 mg 150 mL/hr over 60 Minutes Intravenous Every 8 hours 03/26/17 1149 03/27/17 1325   03/22/17 1400  ceFAZolin (ANCEF) IVPB 2g/100 mL premix     2 g 200 mL/hr over 30 Minutes Intravenous Every 8 hours 03/22/17 1125 04/15/17 2359   03/19/17 1400  ceFEPIme (MAXIPIME)  2 g in sodium chloride 0.9 % 100 mL IVPB  Status:  Discontinued     2 g 200 mL/hr over 30 Minutes Intravenous Every 8 hours 03/19/17 1357 03/22/17 1125   03/02/17 2200  linezolid (ZYVOX) tablet 600 mg  Status:  Discontinued     600 mg Oral Every 12 hours 03/02/17 2037 03/03/17 1203   02/24/17 1600  nafcillin 2 g in sodium chloride 0.9 % 100 mL IVPB  Status:  Discontinued     2 g 200 mL/hr over 30 Minutes Intravenous Every 4 hours 02/24/17 0833 03/19/17 1411   02/21/17 2000  nafcillin 2 g in dextrose 5 % 100 mL IVPB  Status:  Discontinued     2 g 200 mL/hr over 30 Minutes Intravenous Every 4 hours 02/21/17 1741 02/24/17 0910   02/21/17 1730  nafcillin injection 2 g  Status:  Discontinued     2 g Intravenous Every 4 hours 02/21/17 1726 02/21/17 1740   02/19/17 1230  ceFAZolin (ANCEF) IVPB 2g/100 mL premix  Status:  Discontinued     2 g 200 mL/hr over 30 Minutes Intravenous Every 8 hours 02/19/17 1148 02/21/17 1726   02/18/17 1330  linezolid (ZYVOX) tablet 600 mg  Status:  Discontinued     600 mg Oral Every 12 hours 02/18/17 1250 02/19/17 1126   02/17/17 0800  sulfamethoxazole-trimethoprim (BACTRIM DS,SEPTRA DS) 800-160 MG per tablet 2 tablet  Status:  Discontinued     2 tablet Oral Every 12 hours 02/16/17 1453 02/18/17 1250   02/16/17 1600  sulfamethoxazole-trimethoprim (BACTRIM DS,SEPTRA DS) 800-160 MG per tablet 2 tablet     2 tablet Oral  Once 02/16/17 1509 02/16/17 1657   02/14/17 1400  ceFAZolin (ANCEF) IVPB 2g/100 mL premix  Status:  Discontinued     2 g 200 mL/hr over 30 Minutes Intravenous Every 8 hours 02/14/17 1013 02/16/17 1453   02/14/17 0200  vancomycin (VANCOCIN) IVPB 750 mg/150 ml premix  Status:  Discontinued     750 mg 150 mL/hr over 60 Minutes Intravenous Every 8 hours 02/13/17 1739 02/14/17 1017   02/14/17 0000  piperacillin-tazobactam (ZOSYN) IVPB 3.375 g  Status:  Discontinued     3.375 g 12.5 mL/hr over 240 Minutes Intravenous Every 8 hours 02/13/17 1739 02/14/17  1013   02/13/17 1715  piperacillin-tazobactam (ZOSYN) IVPB 3.375 g     3.375 g 100 mL/hr over 30 Minutes Intravenous  Once 02/13/17 1701 02/13/17 1824   02/13/17 1715  vancomycin (VANCOCIN) IVPB 1000 mg/200 mL premix  Status:  Discontinued     1,000 mg 200 mL/hr over 60 Minutes Intravenous  Once 02/13/17 1701 02/13/17 1705   02/13/17 1715  vancomycin (VANCOCIN) 1,500 mg in sodium chloride 0.9 % 500 mL IVPB     1,500 mg 250 mL/hr over 120 Minutes Intravenous  Once 02/13/17 1705 02/13/17 2300      Subjective:   [name] seen.  No new complaints.  No fever or chills.  No chest pain.    Objective:   Vitals:   04/06/17 0606 04/06/17 1427 04/06/17 2318 04/07/17 0715  BP: 124/71 108/67 133/73 131/88  Pulse: 78 (!) 104 67 89  Resp: '18 14 18 18  ' Temp: 98.1 F (36.7 C) 98.6 F (37 C) 97.9 F (36.6 C) 98.4 F (36.9 C)  TempSrc:  Oral Oral Oral  SpO2: 96% 98% 98% 97%  Weight:    75.3 kg (166 lb 1.6 oz)  Height:        Intake/Output Summary (Last 24 hours) at 04/07/2017 2641 Last data filed at 04/07/2017  1610 Gross per 24 hour  Intake 1000 ml  Output -  Net 1000 ml   Filed Weights   04/05/17 0414 04/06/17 0317 04/07/17 0715  Weight: 76.4 kg (168 lb 6.9 oz) 76.6 kg (168 lb 14 oz) 75.3 kg (166 lb 1.6 oz)   Exam  General: Well developed, well nourished, NAD, appears stated age  HEENT: NCAT, mucous membranes moist.   Neck: Supple, left sided neck edema   Cardiovascular: S1 S2 auscultated, SEM, RRR  Respiratory: Clear to auscultation bilaterally with equal chest rise  Abdomen: Soft, nontender, nondistended, + bowel sounds  Extremities: warm dry without cyanosis clubbing. LLE edema improving.  Neuro: AAOx3, nonfocal  Psych: Appropriate mood and affect  Data Reviewed: I have personally reviewed following labs and imaging studies  CBC: Recent Labs  Lab 04/01/17 0349 04/03/17 0424 04/05/17 0419 04/07/17 0350  WBC 9.4 10.6* 10.0 12.6*  HGB 8.9* 9.7* 9.2* 9.7*    HCT 28.2* 30.5* 29.1* 30.2*  MCV 92.5 92.1 93.0 91.8  PLT 286 313 272 960   Basic Metabolic Panel: Recent Labs  Lab 04/01/17 0349 04/03/17 0424 04/05/17 0419 04/07/17 0350  NA 136 137 136 137  K 3.6 3.7 3.6 4.4  CL 98* 100* 101 100*  CO2 '28 28 28 27  ' GLUCOSE 90 100* 101* 190*  BUN <5* <5* <5* 6  CREATININE 0.67 0.69 0.64 0.64  CALCIUM 8.5* 8.7* 8.6* 9.4  MG  --   --  1.8  --    GFR: Estimated Creatinine Clearance: 106.9 mL/min (by C-G formula based on SCr of 0.64 mg/dL). Liver Function Tests: No results for input(s): AST, ALT, ALKPHOS, BILITOT, PROT, ALBUMIN in the last 168 hours. No results for input(s): LIPASE, AMYLASE in the last 168 hours. No results for input(s): AMMONIA in the last 168 hours. Coagulation Profile: No results for input(s): INR, PROTIME in the last 168 hours. Cardiac Enzymes: No results for input(s): CKTOTAL, CKMB, CKMBINDEX, TROPONINI in the last 168 hours. BNP (last 3 results) No results for input(s): PROBNP in the last 8760 hours. HbA1C: No results for input(s): HGBA1C in the last 72 hours. CBG: No results for input(s): GLUCAP in the last 168 hours. Lipid Profile: No results for input(s): CHOL, HDL, LDLCALC, TRIG, CHOLHDL, LDLDIRECT in the last 72 hours. Thyroid Function Tests: No results for input(s): TSH, T4TOTAL, FREET4, T3FREE, THYROIDAB in the last 72 hours. Anemia Panel: No results for input(s): VITAMINB12, FOLATE, FERRITIN, TIBC, IRON, RETICCTPCT in the last 72 hours. Urine analysis:    Component Value Date/Time   COLORURINE AMBER (A) 02/13/2017 0436   APPEARANCEUR HAZY (A) 02/13/2017 0436   LABSPEC 1.014 02/13/2017 0436   PHURINE 5.0 02/13/2017 0436   GLUCOSEU NEGATIVE 02/13/2017 0436   HGBUR LARGE (A) 02/13/2017 0436   BILIRUBINUR NEGATIVE 02/13/2017 0436   KETONESUR NEGATIVE 02/13/2017 0436   PROTEINUR NEGATIVE 02/13/2017 0436   UROBILINOGEN 0.2 12/07/2012 1510   NITRITE POSITIVE (A) 02/13/2017 0436   LEUKOCYTESUR TRACE (A)  02/13/2017 0436   Sepsis Labs: '@LABRCNTIP' (procalcitonin:4,lacticidven:4)  )No results found for this or any previous visit (from the past 240 hour(s)).    Radiology Studies: No results found.   Scheduled Meds: . buprenorphine-naloxone  2 tablet Sublingual Daily  . dexamethasone  10 mg Intravenous Q24H  . DULoxetine  60 mg Oral BID  . enoxaparin (LOVENOX) injection  40 mg Subcutaneous Q24H  . ferrous sulfate  325 mg Oral TID WC  . gadobenate dimeglumine  15 mL Intravenous Once  . methocarbamol  500 mg Oral TID  . multivitamin with minerals  1 tablet Oral Daily  . nicotine  7 mg Transdermal Q24H  . OLANZapine  5 mg Oral q morning - 10a  . pantoprazole  40 mg Oral BID  . polyethylene glycol  17 g Oral Daily  . potassium chloride  40 mEq Oral BID  . senna-docusate  1 tablet Oral BID   Continuous Infusions: . sodium chloride    .  ceFAZolin (ANCEF) IV Stopped (04/07/17 3500)     LOS: 53 days   Time Spent in minutes   30 minutes  Bonnell Public D.O. on 04/07/2017 at 9:52 AM  Between 7am to 7pm - Pager - [phone]  After 7pm go to [email or web address] - password [email or web address]  And look for the night coverage person covering for me after hours  [facility] Office  [phone]

## 2017-04-07 NOTE — Progress Notes (Signed)
Went in pt room at 730 for report and room smelled like cigarettes.  Pt denies smoking in room.

## 2017-04-07 NOTE — Progress Notes (Signed)
Nutrition Follow-up  DOCUMENTATION CODES:   Not applicable  INTERVENTION:  Monitor for needs Follow SLP recommendaitons  NUTRITION DIAGNOSIS:   Inadequate oral intake related to lethargy/confusion, poor appetite as evidenced by meal completion < 50%. -resolved  GOAL:   Patient will meet greater than or equal to 90% of their needs -met  MONITOR:   PO intake, Supplement acceptance, Labs, Weight trends, Skin, I & O's  REASON FOR ASSESSMENT:   Malnutrition Screening Tool    ASSESSMENT:   32 y.o. female w/ a hx of fibromyalgia, anemia, and IV drug abuse who presented with a few days history of fever, chills, generalized weakness muscle aches, and arthralgias.  In the emergency room the patient was noted to have fever, leukocytosis and left hand and bilateral lower extremity redness and petechiae.    Patient continues with edema to L neck, cellulitis per MD. Otherwise stable from a nutrition standpoint. PO 75-100% and still getting food from outside. Weight stable.  Labs reviewed Medications reviewed and include:  MVI w/ Minerals, Decadron, Iron, Miralax, Senokot-S  Diet Order:  Diet regular Room service appropriate? Yes; Fluid consistency: Thin  EDUCATION NEEDS:   Education needs have been addressed  Skin:  Skin Assessment: Reviewed RN Assessment  Last BM:  04/05/2017  Height:   Ht Readings from Last 1 Encounters:  03/11/17 '5\' 7"'  (1.702 m)    Weight:   Wt Readings from Last 1 Encounters:  04/07/17 166 lb 1.6 oz (75.3 kg)    Ideal Body Weight:  61.4 kg  BMI:  Body mass index is 26.01 kg/m.  Estimated Nutritional Needs:   Kcal:  1700-1900  Protein:  90-105 grams  Fluid:  1.7-1.9 L  Satira Anis. Joan Herschberger, MS, RD LDN Inpatient Clinical Dietitian Pager [phone]

## 2017-04-08 ENCOUNTER — Inpatient Hospital Stay (HOSPITAL_COMMUNITY): Payer: Medicaid Other

## 2017-04-08 MED ORDER — MUPIROCIN 2 % EX OINT: 1.0000 "application " | TOPICAL_OINTMENT | Freq: Two times a day (BID) | CUTANEOUS | Status: DC

## 2017-04-08 MED ORDER — IOPAMIDOL (ISOVUE-300) INJECTION 61%
INTRAVENOUS | Status: AC
  Administered 2017-04-08: 75 mL
  Filled 2017-04-08: qty 75

## 2017-04-08 NOTE — Anesthesia Preprocedure Evaluation (Addendum)
Anesthesia Evaluation  Patient identified by MRN, date of birth, ID band Patient awake    Reviewed: Allergy & Precautions, NPO status , Patient's Chart, lab work & pertinent test results  Airway Mallampati: III  TM Distance: >3 FB Neck ROM: Full    Dental  (+) Dental Advisory Given, Edentulous Upper, Partial Lower   Pulmonary asthma , Current Smoker,    Pulmonary exam normal breath sounds clear to auscultation       Cardiovascular Normal cardiovascular exam+ Valvular Problems/Murmurs  Rhythm:Regular Rate:Normal  '19 TTE - Mild LVH. EF 50% to 55%. There is a small semi mobile density attached to the atrial side of the anterior tricuspid valve leaflet. There  looks to be at least partial prolapse of a portion of the anterior leaflet, with associated regurgitaton. The TR is eccentric and posterior, may be underestimated. There was moderate-severe regurgitation.   Neuro/Psych  Headaches, Anxiety    GI/Hepatic GERD  Medicated and Controlled,(+)     substance abuse  IV drug use, Hx narcotic abuse IBS   Endo/Other  negative endocrine ROS  Renal/GU negative Renal ROS     Musculoskeletal  (+) Arthritis , Fibromyalgia -  Abdominal   Peds  Hematology  (+) anemia ,   Anesthesia Other Findings   Reproductive/Obstetrics                            Anesthesia Physical  Anesthesia Plan  ASA: III  Anesthesia Plan: General   Post-op Pain Management:    Induction: Intravenous  PONV Risk Score and Plan: 2 and Treatment may vary due to age or medical condition, Ondansetron and Midazolam  Airway Management Planned: LMA  Additional Equipment: None  Intra-op Plan:   Post-operative Plan: Extubation in OR  Informed Consent: I have reviewed the patients History and Physical, chart, labs and discussed the procedure including the risks, benefits and alternatives for the proposed anesthesia with the  patient or authorized representative who has indicated his/her understanding and acceptance.   Dental advisory given  Plan Discussed with: CRNA and Anesthesiologist  Anesthesia Plan Comments:         Anesthesia Quick Evaluation

## 2017-04-08 NOTE — Progress Notes (Signed)
SLP Cancellation Note  Patient Details Name: [name] MRN: [id number] DOB: 01/04/1986   Cancelled treatment:       Reason Eval/Treat Not Completed: Other (comment)(pt has a sign on her door requesting to be left alone as she is "laying down", will continue efforts)   [name], [name] 04/08/2017, 9:29 AM  [name], MS [facility] SLP [phone]

## 2017-04-08 NOTE — Progress Notes (Signed)
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   Patient placed on IV antibiotics and will need to continue IV antibiotics through 04/15/2017.   Patient developed pancytopenia about 2 weeks ago, hematology consulted, thought to be medication related.  Pancytopenia has resolved.  Recent left neck swelling was noted.  Initial CT scan did not reveal an abscess.  Patient was initially started on steroids.  Next swelling has improved, and nontender.  ENT advise repeat CT of the soft tissue of the neck with contrast today, 04/08/2017.  Repeat CT soft tissue of the neck has revealed abscess.  Patient has been kept n.p.o. overnight.  ENT plans to proceed with I&D on 04/09/2017.  Otherwise, no new complaints today, 04/08/2017.  No fever or chills, no shortness of breath, no chest pain.    AdmittedAssessment & Plan   MSSA bacteremia and tricuspid valve endocarditis -Initially placed on IV nafcillin which was discontinued due to suspected bone marrow suppression -Infectious disease consulted and appreciated, recommended antibiotics through 04/15/2017 -currently on Ancef -Cardiology is consulted and appreciated. PN from Dr. Doylene Canard on 02/27/2017 noted the patient may need TEE after completion of antibiotics -discussed with Dr. Doylene Canard on 3/22- will likely repet TEE prior to discharge -Patient has PICC line (placed on 03/21/2017) -Complete course of antibiotics.  Anemia  -patient had bloody/dark stools during this admission -Gastroenterology consulted and appreciated -Suspected anemia multifactorial including possible blood loss versus severe systemic infection versus bone marrow suppression versus antibiotics -Currently no abdominal pain -Given that hemoglobin was  stable, gastroenterology signed off.  Did not feel upper or lower endoscopy was not warranted given that there was no overt bleeding.  Recommended continuing PPI twice daily. -hemoglobin currently 9.2, continue to monitor CBC  Left neck swelling, non tender: -Repeat CT Soft tissue neck with contrast -Possibly lymphadenopathy versus more serious pathology. -Previous CT neck showed no drainable fluid -Currently no erythema noted although patient continues to have edema -ENT, Dr. Blenda Nicely, consulted and appreciated. ENT suggests repeat CT Soft tissue Neck. -CT soft tissue of the neck revealed an abscess today, 04/08/2017. -N.p.o. overnight. -Possible I&D by ENT tomorrow, 04/09/2017.   Right sternoclavivular septic arthritis  -CT surgery recommended outpatient follow in 4 weeks  Chronic pain management/history of IV drug abuse/ anxiety and depression -Continue Cymbalta, oxycodone, Zyprexa, Tylenol as needed -Currently on Suboxone  Pancytopenia -Resolved, Likely due to bone marrow suppression in the setting of sepsis/endocarditis -Hematology consulted and appreciated, status post bone marrow biopsy-suggested changes secondary to medication -Continue to monitor CBC (appears to be improving)  Hypokalemia -resolved, continue BMP and replace as needed   Asymptomatic bradycardia  -resolved  Left lower extremity edema -Lower extremity Doppler was negative for DVT -Encourage ambulation and elevating leg -Continue to monitor  DVT Prophylaxis  Lovenox  Code Status: Full  Family Communication: Family at bedside  Disposition Plan: Admitted. Will continue antibiotics through 4/2. Dispo home after completion of antibiotics  Consultants ENT Gastroenterology Hematology Cardiology  Infectious disease Interventional radiology (bone marrow biopsy)  Procedures  Lower extremity doppler TEE  Antibiotics   Anti-infectives (From admission, onward)   Start     Dose/Rate Route Frequency  Ordered Stop   03/26/17 1200  vancomycin (VANCOCIN) IVPB 750 mg/150 ml premix  Status:  Discontinued  750 mg 150 mL/hr over 60 Minutes Intravenous Every 8 hours 03/26/17 1149 03/27/17 1325   03/22/17 1400  ceFAZolin (ANCEF) IVPB 2g/100 mL premix     2 g 200 mL/hr over 30 Minutes Intravenous Every 8 hours 03/22/17 1125 04/15/17 2359   03/19/17 1400  ceFEPIme (MAXIPIME) 2 g in sodium chloride 0.9 % 100 mL IVPB  Status:  Discontinued     2 g 200 mL/hr over 30 Minutes Intravenous Every 8 hours 03/19/17 1357 03/22/17 1125   03/02/17 2200  linezolid (ZYVOX) tablet 600 mg  Status:  Discontinued     600 mg Oral Every 12 hours 03/02/17 2037 03/03/17 1203   02/24/17 1600  nafcillin 2 g in sodium chloride 0.9 % 100 mL IVPB  Status:  Discontinued     2 g 200 mL/hr over 30 Minutes Intravenous Every 4 hours 02/24/17 0833 03/19/17 1411   02/21/17 2000  nafcillin 2 g in dextrose 5 % 100 mL IVPB  Status:  Discontinued     2 g 200 mL/hr over 30 Minutes Intravenous Every 4 hours 02/21/17 1741 02/24/17 0910   02/21/17 1730  nafcillin injection 2 g  Status:  Discontinued     2 g Intravenous Every 4 hours 02/21/17 1726 02/21/17 1740   02/19/17 1230  ceFAZolin (ANCEF) IVPB 2g/100 mL premix  Status:  Discontinued     2 g 200 mL/hr over 30 Minutes Intravenous Every 8 hours 02/19/17 1148 02/21/17 1726   02/18/17 1330  linezolid (ZYVOX) tablet 600 mg  Status:  Discontinued     600 mg Oral Every 12 hours 02/18/17 1250 02/19/17 1126   02/17/17 0800  sulfamethoxazole-trimethoprim (BACTRIM DS,SEPTRA DS) 800-160 MG per tablet 2 tablet  Status:  Discontinued     2 tablet Oral Every 12 hours 02/16/17 1453 02/18/17 1250   02/16/17 1600  sulfamethoxazole-trimethoprim (BACTRIM DS,SEPTRA DS) 800-160 MG per tablet 2 tablet     2 tablet Oral  Once 02/16/17 1509 02/16/17 1657   02/14/17 1400  ceFAZolin (ANCEF) IVPB 2g/100 mL premix  Status:  Discontinued     2 g 200 mL/hr over 30 Minutes Intravenous Every 8 hours 02/14/17  1013 02/16/17 1453   02/14/17 0200  vancomycin (VANCOCIN) IVPB 750 mg/150 ml premix  Status:  Discontinued     750 mg 150 mL/hr over 60 Minutes Intravenous Every 8 hours 02/13/17 1739 02/14/17 1017   02/14/17 0000  piperacillin-tazobactam (ZOSYN) IVPB 3.375 g  Status:  Discontinued     3.375 g 12.5 mL/hr over 240 Minutes Intravenous Every 8 hours 02/13/17 1739 02/14/17 1013   02/13/17 1715  piperacillin-tazobactam (ZOSYN) IVPB 3.375 g     3.375 g 100 mL/hr over 30 Minutes Intravenous  Once 02/13/17 1701 02/13/17 1824   02/13/17 1715  vancomycin (VANCOCIN) IVPB 1000 mg/200 mL premix  Status:  Discontinued     1,000 mg 200 mL/hr over 60 Minutes Intravenous  Once 02/13/17 1701 02/13/17 1705   02/13/17 1715  vancomycin (VANCOCIN) 1,500 mg in sodium chloride 0.9 % 500 mL IVPB     1,500 mg 250 mL/hr over 120 Minutes Intravenous  Once 02/13/17 1705 02/13/17 2300      Subjective:   [name] seen.  No new complaints.  No fever or chills.  No chest pain.    Objective:   Vitals:   04/07/17 1403 04/07/17 2322 04/08/17 0500 04/08/17 0507  BP: 114/77 126/76  120/85  Pulse: 84 76  64  Resp:  18  16  Temp: 97.7 F (36.5 C) 97.7 F (36.5 C)  98.3 F (36.8 C)  TempSrc: Oral Oral    SpO2: 97% 96%  96%  Weight:   74.9 kg (165 lb 2 oz)   Height:        Intake/Output Summary (Last 24 hours) at 04/08/2017 1007 Last data filed at 04/08/2017 0817 Gross per 24 hour  Intake 10 ml  Output -  Net 10 ml   Filed Weights   04/06/17 0317 04/07/17 0715 04/08/17 0500  Weight: 76.6 kg (168 lb 14 oz) 75.3 kg (166 lb 1.6 oz) 74.9 kg (165 lb 2 oz)   Exam  General: Well developed, well nourished, NAD, appears stated age  51: NCAT, mucous membranes moist.   Neck: Supple, left sided neck edema   Cardiovascular: S1 S2 auscultated, SEM, RRR  Respiratory: Clear to auscultation bilaterally with equal chest rise  Abdomen: Soft, nontender, nondistended, + bowel sounds  Extremities: warm dry  without cyanosis clubbing. LLE edema improving.  Neuro: AAOx3, nonfocal  Psych: Appropriate mood and affect  Data Reviewed: I have personally reviewed following labs and imaging studies  CBC: Recent Labs  Lab 04/03/17 0424 04/05/17 0419 04/07/17 0350  WBC 10.6* 10.0 12.6*  HGB 9.7* 9.2* 9.7*  HCT 30.5* 29.1* 30.2*  MCV 92.1 93.0 91.8  PLT 313 272 761   Basic Metabolic Panel: Recent Labs  Lab 04/03/17 0424 04/05/17 0419 04/07/17 0350  NA 137 136 137  K 3.7 3.6 4.4  CL 100* 101 100*  CO2 '28 28 27  ' GLUCOSE 100* 101* 190*  BUN <5* <5* 6  CREATININE 0.69 0.64 0.64  CALCIUM 8.7* 8.6* 9.4  MG  --  1.8  --    GFR: Estimated Creatinine Clearance: 106.6 mL/min (by C-G formula based on SCr of 0.64 mg/dL). Liver Function Tests: No results for input(s): AST, ALT, ALKPHOS, BILITOT, PROT, ALBUMIN in the last 168 hours. No results for input(s): LIPASE, AMYLASE in the last 168 hours. No results for input(s): AMMONIA in the last 168 hours. Coagulation Profile: No results for input(s): INR, PROTIME in the last 168 hours. Cardiac Enzymes: No results for input(s): CKTOTAL, CKMB, CKMBINDEX, TROPONINI in the last 168 hours. BNP (last 3 results) No results for input(s): PROBNP in the last 8760 hours. HbA1C: No results for input(s): HGBA1C in the last 72 hours. CBG: No results for input(s): GLUCAP in the last 168 hours. Lipid Profile: No results for input(s): CHOL, HDL, LDLCALC, TRIG, CHOLHDL, LDLDIRECT in the last 72 hours. Thyroid Function Tests: No results for input(s): TSH, T4TOTAL, FREET4, T3FREE, THYROIDAB in the last 72 hours. Anemia Panel: No results for input(s): VITAMINB12, FOLATE, FERRITIN, TIBC, IRON, RETICCTPCT in the last 72 hours. Urine analysis:    Component Value Date/Time   COLORURINE AMBER (A) 02/13/2017 0436   APPEARANCEUR HAZY (A) 02/13/2017 0436   LABSPEC 1.014 02/13/2017 0436   PHURINE 5.0 02/13/2017 0436   GLUCOSEU NEGATIVE 02/13/2017 0436   HGBUR  LARGE (A) 02/13/2017 0436   BILIRUBINUR NEGATIVE 02/13/2017 0436   KETONESUR NEGATIVE 02/13/2017 0436   PROTEINUR NEGATIVE 02/13/2017 0436   UROBILINOGEN 0.2 12/07/2012 1510   NITRITE POSITIVE (A) 02/13/2017 0436   LEUKOCYTESUR TRACE (A) 02/13/2017 0436   Sepsis Labs: '@LABRCNTIP' (procalcitonin:4,lacticidven:4)  )No results found for this or any previous visit (from the past 240 hour(s)).    Radiology Studies: No results found.   Scheduled Meds: . buprenorphine-naloxone  2 tablet Sublingual Daily  . DULoxetine  60 mg Oral BID  .  enoxaparin (LOVENOX) injection  40 mg Subcutaneous Q24H  . ferrous sulfate  325 mg Oral TID WC  . gadobenate dimeglumine  15 mL Intravenous Once  . methocarbamol  500 mg Oral TID  . multivitamin with minerals  1 tablet Oral Daily  . nicotine  7 mg Transdermal Q24H  . OLANZapine  5 mg Oral q morning - 10a  . pantoprazole  40 mg Oral BID  . polyethylene glycol  17 g Oral Daily  . potassium chloride  40 mEq Oral BID  . senna-docusate  1 tablet Oral BID   Continuous Infusions: . sodium chloride    .  ceFAZolin (ANCEF) IV 2 g (04/08/17 0515)     LOS: 54 days   Time Spent in minutes   30 minutes  Bonnell Public D.O. on 04/08/2017 at 10:07 AM  Between 7am to 7pm - Pager - [phone]  After 7pm go to [email or web address] - password [email or web address]  And look for the night coverage person covering for me after hours  [facility] Office  [phone]

## 2017-04-09 ENCOUNTER — Inpatient Hospital Stay (HOSPITAL_COMMUNITY): Payer: Medicaid Other | Admitting: Anesthesiology

## 2017-04-09 ENCOUNTER — Encounter (HOSPITAL_COMMUNITY): Payer: Self-pay | Admitting: Certified Registered Nurse Anesthetist

## 2017-04-09 ENCOUNTER — Encounter (HOSPITAL_COMMUNITY)
Admission: EM | Disposition: A | Discharge status: Home / Self Care | Payer: Self-pay | Source: Home / Self Care | Attending: Internal Medicine

## 2017-04-09 HISTORY — PX: INCISION AND DRAINAGE ABSCESS: SHX5864

## 2017-04-09 LAB — GLUCOSE, CAPILLARY: Glucose-Capillary: 83 mg/dL (ref 65–99)

## 2017-04-09 LAB — SURGICAL PCR SCREEN
MRSA, PCR: NEGATIVE
Staphylococcus aureus: NEGATIVE

## 2017-04-09 SURGERY — INCISION AND DRAINAGE, ABSCESS
Anesthesia: General | Site: Neck | Laterality: Left

## 2017-04-09 MED ORDER — ACETAMINOPHEN 10 MG/ML IV SOLN
1000.0000 mg | Freq: Once | INTRAVENOUS | Status: DC | PRN
  Administered 2017-04-09: 1000 mg via INTRAVENOUS

## 2017-04-09 MED ORDER — LIDOCAINE 2% (20 MG/ML) 5 ML SYRINGE
INTRAMUSCULAR | Status: DC | PRN
  Administered 2017-04-09: 100 mg via INTRAVENOUS

## 2017-04-09 MED ORDER — BACITRACIN ZINC 500 UNIT/GM EX OINT
TOPICAL_OINTMENT | CUTANEOUS | Status: AC
  Filled 2017-04-09: qty 28.35

## 2017-04-09 MED ORDER — PROPOFOL 10 MG/ML IV BOLUS
INTRAVENOUS | Status: DC | PRN
  Administered 2017-04-09: 200 mg via INTRAVENOUS

## 2017-04-09 MED ORDER — FENTANYL CITRATE (PF) 100 MCG/2ML IJ SOLN
INTRAMUSCULAR | Status: AC
  Administered 2017-04-09: 50 ug via INTRAVENOUS
  Filled 2017-04-09: qty 2

## 2017-04-09 MED ORDER — LORAZEPAM 2 MG/ML IJ SOLN
0.5000 mg | Freq: Once | INTRAMUSCULAR | Status: AC
  Administered 2017-04-09: 0.5 mg via INTRAVENOUS
  Filled 2017-04-09: qty 1

## 2017-04-09 MED ORDER — FENTANYL CITRATE (PF) 100 MCG/2ML IJ SOLN
INTRAMUSCULAR | Status: DC | PRN
  Administered 2017-04-09: 100 ug via INTRAVENOUS
  Administered 2017-04-09 (×3): 50 ug via INTRAVENOUS

## 2017-04-09 MED ORDER — FENTANYL CITRATE (PF) 100 MCG/2ML IJ SOLN
25.0000 ug | INTRAMUSCULAR | Status: DC | PRN
  Administered 2017-04-09 (×2): 50 ug via INTRAVENOUS

## 2017-04-09 MED ORDER — ONDANSETRON HCL 4 MG/2ML IJ SOLN
INTRAMUSCULAR | Status: DC | PRN
  Administered 2017-04-09: 4 mg via INTRAVENOUS

## 2017-04-09 MED ORDER — PROMETHAZINE HCL 25 MG/ML IJ SOLN: 6.2500 mg | INTRAMUSCULAR | Status: DC | PRN

## 2017-04-09 MED ORDER — MIDAZOLAM HCL 2 MG/2ML IJ SOLN
INTRAMUSCULAR | Status: AC
  Filled 2017-04-09: qty 2

## 2017-04-09 MED ORDER — MIDAZOLAM HCL 5 MG/5ML IJ SOLN
INTRAMUSCULAR | Status: DC | PRN
  Administered 2017-04-09: 2 mg via INTRAVENOUS

## 2017-04-09 MED ORDER — LACTATED RINGERS IV SOLN
INTRAVENOUS | Status: DC | PRN
  Administered 2017-04-09: 07:00:00 via INTRAVENOUS

## 2017-04-09 MED ORDER — ACETAMINOPHEN 10 MG/ML IV SOLN
INTRAVENOUS | Status: AC
  Filled 2017-04-09: qty 100

## 2017-04-09 MED ORDER — 0.9 % SODIUM CHLORIDE (POUR BTL) OPTIME
TOPICAL | Status: DC | PRN
  Administered 2017-04-09: 1000 mL

## 2017-04-09 MED ORDER — PROPOFOL 10 MG/ML IV BOLUS
INTRAVENOUS | Status: AC
  Filled 2017-04-09: qty 40

## 2017-04-09 MED ORDER — FENTANYL CITRATE (PF) 250 MCG/5ML IJ SOLN
INTRAMUSCULAR | Status: AC
  Filled 2017-04-09: qty 5

## 2017-04-09 MED ORDER — LIDOCAINE-EPINEPHRINE 1 %-1:100000 IJ SOLN
INTRAMUSCULAR | Status: AC
  Filled 2017-04-09: qty 1

## 2017-04-09 SURGICAL SUPPLY — 42 items
BLADE SURG 15 STRL LF DISP TIS (BLADE) IMPLANT
BLADE SURG 15 STRL SS (BLADE) ×2
BNDG CMPR 75X41 PLY HI ABS (GAUZE/BANDAGES/DRESSINGS) ×1
BNDG GAUZE ELAST 4 BULKY (GAUZE/BANDAGES/DRESSINGS) ×1 IMPLANT
BNDG STRETCH 4X75 STRL LF (GAUZE/BANDAGES/DRESSINGS) ×1 IMPLANT
CANISTER SUCT 3000ML PPV (MISCELLANEOUS) ×1 IMPLANT
CLEANER TIP ELECTROSURG 2X2 (MISCELLANEOUS) ×1 IMPLANT
CONT SPEC 4OZ CLIKSEAL STRL BL (MISCELLANEOUS) IMPLANT
COVER SURGICAL LIGHT HANDLE (MISCELLANEOUS) ×1 IMPLANT
CRADLE DONUT ADULT HEAD (MISCELLANEOUS) ×1 IMPLANT
DRAIN PENROSE 1/4X12 LTX STRL (WOUND CARE) ×1 IMPLANT
DRAPE HALF SHEET 40X57 (DRAPES) IMPLANT
DRAPE POUCH INSTRU U-SHP 10X18 (DRAPES) ×1 IMPLANT
ELECT COATED BLADE 2.86 ST (ELECTRODE) ×2 IMPLANT
ELECT NDL TIP 2.8 STRL (NEEDLE) IMPLANT
ELECT NEEDLE TIP 2.8 STRL (NEEDLE) ×2 IMPLANT
ELECT REM PT RETURN 9FT ADLT (ELECTROSURGICAL) ×2
ELECTRODE REM PT RTRN 9FT ADLT (ELECTROSURGICAL) ×1 IMPLANT
GAUZE SPONGE 4X4 16PLY XRAY LF (GAUZE/BANDAGES/DRESSINGS) ×1 IMPLANT
GLOVE BIO SURGEON STRL SZ 6.5 (GLOVE) ×3 IMPLANT
GLOVE BIOGEL PI IND STRL 8 (GLOVE) IMPLANT
GLOVE BIOGEL PI INDICATOR 8 (GLOVE) ×2
GLOVE ECLIPSE 7.5 STRL STRAW (GLOVE) ×3 IMPLANT
GOWN SPEC L3 XXLG W/TWL (GOWN DISPOSABLE) ×1 IMPLANT
GOWN STRL REUS W/ TWL LRG LVL3 (GOWN DISPOSABLE) ×2 IMPLANT
GOWN STRL REUS W/TWL LRG LVL3 (GOWN DISPOSABLE) ×2
KIT BASIN OR (CUSTOM PROCEDURE TRAY) ×2 IMPLANT
KIT TURNOVER KIT B (KITS) ×2 IMPLANT
NDL HYPO 25GX1X1/2 BEV (NEEDLE) IMPLANT
NEEDLE HYPO 25GX1X1/2 BEV (NEEDLE) ×2 IMPLANT
NS IRRIG 1000ML POUR BTL (IV SOLUTION) ×2 IMPLANT
PAD ARMBOARD 7.5X6 YLW CONV (MISCELLANEOUS) ×4 IMPLANT
PENCIL BUTTON HOLSTER BLD 10FT (ELECTRODE) ×2 IMPLANT
SUT CHROMIC 4 0 P 3 18 (SUTURE) IMPLANT
SUT ETHILON 4 0 PS 2 18 (SUTURE) ×1 IMPLANT
SUT ETHILON 5 0 P 3 18 (SUTURE)
SUT NYLON ETHILON 5-0 P-3 1X18 (SUTURE) IMPLANT
SUT SILK 4 0 (SUTURE)
SUT SILK 4-0 18XBRD TIE 12 (SUTURE) IMPLANT
TOWEL NATURAL 6PK STERILE (DISPOSABLE) ×1 IMPLANT
TRAY ENT MC OR (CUSTOM PROCEDURE TRAY) ×2 IMPLANT
WATER STERILE IRR 1000ML POUR (IV SOLUTION) ×2 IMPLANT

## 2017-04-09 NOTE — Transfer of Care (Signed)
Immediate Anesthesia Transfer of Care Note  Patient: [name]  Procedure(s) Performed: INCISION AND DRAINAGE ABSCESS OF NECK (Left Neck)  Patient Location: PACU  Anesthesia Type:General  Level of Consciousness: awake, patient cooperative and responds to stimulation  Airway & Oxygen Therapy: Patient Spontanous Breathing and Patient connected to nasal cannula oxygen  Post-op Assessment: Report given to RN and Post -op Vital signs reviewed and stable  Post vital signs: Reviewed and stable  Last Vitals:  Vitals Value Taken Time  BP 124/85 04/09/2017  8:11 AM  Temp 36.7 C 04/09/2017  8:11 AM  Pulse 76 04/09/2017  8:13 AM  Resp 15 04/09/2017  8:13 AM  SpO2 99 % 04/09/2017  8:13 AM  Vitals shown include unvalidated device data.  Last Pain:  Vitals:   04/09/17 0811  TempSrc:   PainSc: 0-No pain      Patients Stated Pain Goal: 3 (04/08/17 2115)  Complications: No apparent anesthesia complications

## 2017-04-09 NOTE — Progress Notes (Signed)
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   Patient placed on IV antibiotics and will need to continue IV antibiotics through 04/15/2017.   Patient developed pancytopenia about 2 weeks ago, hematology consulted, thought to be medication related.  Pancytopenia has resolved.  Recent left neck swelling was noted.  Initial CT scan did not reveal an abscess.  Patient was initially started on steroids.  Next swelling has improved, and nontender.  ENT advise repeat CT of the soft tissue of the neck with contrast today, 04/08/2017.  Repeat CT soft tissue of the neck has revealed abscess.  Patient has been kept n.p.o. overnight.  ENT plans to proceed with I&D on 04/09/2017.  Otherwise, no new complaints today, 04/08/2017.  No fever or chills, no shortness of breath, no chest pain.    AdmittedAssessment & Plan   MSSA bacteremia and tricuspid valve endocarditis -Initially placed on IV nafcillin which was discontinued due to suspected bone marrow suppression -Infectious disease consulted and appreciated, recommended antibiotics through 04/15/2017 -currently on Ancef -Cardiology is consulted and appreciated. PN from Dr. Doylene Canard on 02/27/2017 noted the patient may need TEE after completion of antibiotics -discussed with Dr. Doylene Canard on 3/22- will likely repeat TEE prior to discharge--will discuss timing of this with Dr. Raliegh Ip on 3/28 -Patient has PICC line (placed on 03/21/2017) -Complete course of antibiotics.  Anemia  -patient had bloody/dark stools during this admission -Suspected anemia multifactorial including possible blood loss versus severe systemic infection versus bone marrow suppression versus antibiotics -Given that hemoglobin was stable, gastroenterology saw  and signed off.  Did not feel upper or lower endoscopy was not warranted given that there was no overt bleeding.  Recommended continuing PPI twice daily. -hemoglobin currently 9.2, continue to monitor CBC  Left neck swelling, non tender: -Repeat CT Soft tissue neck with contrast -Possibly lymphadenopathy versus more serious pathology. -Previous CT neck showed no drainable fluid -Currently no erythema noted although patient continues to have edema -ENT, Dr. Blenda Nicely, consulted and appreciated. ENT suggests repeat CT Soft tissue Neck. -CT soft tissue of the neck revealed an abscess today, 04/08/2017 -s/p I& D 04/09/2017--post op management wound per Dr. Blenda Nicely  Chronic pain management/history of IV drug abuse/ anxiety and depression -Continue Cymbalta, oxycodone, Zyprexa, Tylenol as needed -Currently on Suboxone  Pancytopenia -Resolved, Likely due to bone marrow suppression in the setting of sepsis/endocarditis -Hematology consulted and appreciated, status post bone marrow biopsy-suggested changes secondary to medication -Continue to monitor CBC (appears to be improving)  Hypokalemia -resolved, continue BMP and replace as needed   Asymptomatic bradycardia  -resolved  Left lower extremity edema -Lower extremity Doppler was negative for DVT -Encourage ambulation and elevating leg -Continue to monitor  DVT Prophylaxis  Lovenox  Code Status: Full  Family Communication: Family at bedside  Disposition Plan: Admitted. Will continue antibiotics through 4/2. Dispo home after completion of antibiotics--if something changes will re-involve ID  Consultants ENT Gastroenterology Hematology Cardiology  Infectious disease Interventional radiology (bone marrow biopsy)  Procedures  Lower extremity doppler TEE  Antibiotics   Anti-infectives (From admission, onward)   Start     Dose/Rate Route Frequency Ordered Stop   03/26/17 1200  vancomycin (VANCOCIN) IVPB 750 mg/150 ml  premix  Status:  Discontinued     750 mg  150 mL/hr over 60 Minutes Intravenous Every 8 hours 03/26/17 1149 03/27/17 1325   03/22/17 1400  ceFAZolin (ANCEF) IVPB 2g/100 mL premix     2 g 200 mL/hr over 30 Minutes Intravenous Every 8 hours 03/22/17 1125 04/15/17 2359   03/19/17 1400  ceFEPIme (MAXIPIME) 2 g in sodium chloride 0.9 % 100 mL IVPB  Status:  Discontinued     2 g 200 mL/hr over 30 Minutes Intravenous Every 8 hours 03/19/17 1357 03/22/17 1125   03/02/17 2200  linezolid (ZYVOX) tablet 600 mg  Status:  Discontinued     600 mg Oral Every 12 hours 03/02/17 2037 03/03/17 1203   02/24/17 1600  nafcillin 2 g in sodium chloride 0.9 % 100 mL IVPB  Status:  Discontinued     2 g 200 mL/hr over 30 Minutes Intravenous Every 4 hours 02/24/17 0833 03/19/17 1411   02/21/17 2000  nafcillin 2 g in dextrose 5 % 100 mL IVPB  Status:  Discontinued     2 g 200 mL/hr over 30 Minutes Intravenous Every 4 hours 02/21/17 1741 02/24/17 0910   02/21/17 1730  nafcillin injection 2 g  Status:  Discontinued     2 g Intravenous Every 4 hours 02/21/17 1726 02/21/17 1740   02/19/17 1230  ceFAZolin (ANCEF) IVPB 2g/100 mL premix  Status:  Discontinued     2 g 200 mL/hr over 30 Minutes Intravenous Every 8 hours 02/19/17 1148 02/21/17 1726   02/18/17 1330  linezolid (ZYVOX) tablet 600 mg  Status:  Discontinued     600 mg Oral Every 12 hours 02/18/17 1250 02/19/17 1126   02/17/17 0800  sulfamethoxazole-trimethoprim (BACTRIM DS,SEPTRA DS) 800-160 MG per tablet 2 tablet  Status:  Discontinued     2 tablet Oral Every 12 hours 02/16/17 1453 02/18/17 1250   02/16/17 1600  sulfamethoxazole-trimethoprim (BACTRIM DS,SEPTRA DS) 800-160 MG per tablet 2 tablet     2 tablet Oral  Once 02/16/17 1509 02/16/17 1657   02/14/17 1400  ceFAZolin (ANCEF) IVPB 2g/100 mL premix  Status:  Discontinued     2 g 200 mL/hr over 30 Minutes Intravenous Every 8 hours 02/14/17 1013 02/16/17 1453   02/14/17 0200  vancomycin (VANCOCIN) IVPB 750  mg/150 ml premix  Status:  Discontinued     750 mg 150 mL/hr over 60 Minutes Intravenous Every 8 hours 02/13/17 1739 02/14/17 1017   02/14/17 0000  piperacillin-tazobactam (ZOSYN) IVPB 3.375 g  Status:  Discontinued     3.375 g 12.5 mL/hr over 240 Minutes Intravenous Every 8 hours 02/13/17 1739 02/14/17 1013   02/13/17 1715  piperacillin-tazobactam (ZOSYN) IVPB 3.375 g     3.375 g 100 mL/hr over 30 Minutes Intravenous  Once 02/13/17 1701 02/13/17 1824   02/13/17 1715  vancomycin (VANCOCIN) IVPB 1000 mg/200 mL premix  Status:  Discontinued     1,000 mg 200 mL/hr over 60 Minutes Intravenous  Once 02/13/17 1701 02/13/17 1705   02/13/17 1715  vancomycin (VANCOCIN) 1,500 mg in sodium chloride 0.9 % 500 mL IVPB     1,500 mg 250 mL/hr over 120 Minutes Intravenous  Once 02/13/17 1705 02/13/17 2300      Subjective:   ithcy aroudn area of the surgery No cp No fever  No chills No cp nor n/v nor cough and cold  Objective:   Vitals:   04/09/17 0825 04/09/17 0830 04/09/17 0840 04/09/17 0905  BP: 119/83  119/83 128/83  Pulse: 69 73 70 77  Resp: 16 (!) 22 19  18  Temp:   98.5 F (36.9 C) 97.9 F (36.6 C)  TempSrc:    Oral  SpO2: 100% 100% 97% 98%  Weight:      Height:        Intake/Output Summary (Last 24 hours) at 04/09/2017 1619 Last data filed at 04/09/2017 1000 Gross per 24 hour  Intake 350 ml  Output 10 ml  Net 340 ml   Filed Weights   04/07/17 0715 04/08/17 0500 04/09/17 0540  Weight: 75.3 kg (166 lb 1.6 oz) 74.9 kg (165 lb 2 oz) 76.6 kg (168 lb 12.8 oz)   Exam   Flat Affect  In nad cta b Neck has drain and no redness nor warmth to exam s1 s2 no m, RRR Multiple tattoos Neuro intact mOves 4 limbs equally sensory grossly intaCT  Data Reviewed: I have personally reviewed following labs and imaging studies  CBC: Recent Labs  Lab 04/03/17 0424 04/05/17 0419 04/07/17 0350  WBC 10.6* 10.0 12.6*  HGB 9.7* 9.2* 9.7*  HCT 30.5* 29.1* 30.2*  MCV 92.1 93.0 91.8  PLT  313 272 184   Basic Metabolic Panel: Recent Labs  Lab 04/03/17 0424 04/05/17 0419 04/07/17 0350  NA 137 136 137  K 3.7 3.6 4.4  CL 100* 101 100*  CO2 _0 GLUCOSE 100* 101* 190*  BUN <5* <5* 6  CREATININE 0.69 0.64 0.64  CALCIUM 8.7* 8.6* 9.4  MG  --  1.8  --    GFR: Estimated Creatinine Clearance: 107.7 mL/min (by C-G formula based on SCr of 0.64 mg/dL). Liver Function Tests: No results for input(s): AST, ALT, ALKPHOS, BILITOT, PROT, ALBUMIN in the last 168 hours. No results for input(s): LIPASE, AMYLASE in the last 168 hours. No results for input(s): AMMONIA in the last 168 hours. Coagulation Profile: No results for input(s): INR, PROTIME in the last 168 hours. Cardiac Enzymes: No results for input(s): CKTOTAL, CKMB, CKMBINDEX, TROPONINI in the last 168 hours. BNP (last 3 results) No results for input(s): PROBNP in the last 8760 hours. HbA1C: No results for input(s): HGBA1C in the last 72 hours. CBG: No results for input(s): GLUCAP in the last 168 hours. Lipid Profile: No results for input(s): CHOL, HDL, LDLCALC, TRIG, CHOLHDL, LDLDIRECT in the last 72 hours. Thyroid Function Tests: No results for input(s): TSH, T4TOTAL, FREET4, T3FREE, THYROIDAB in the last 72 hours. Anemia Panel: No results for input(s): VITAMINB12, FOLATE, FERRITIN, TIBC, IRON, RETICCTPCT in the last 72 hours. Urine analysis:    Component Value Date/Time   COLORURINE AMBER (A) 02/13/2017 0436   APPEARANCEUR HAZY (A) 02/13/2017 0436   LABSPEC 1.014 02/13/2017 0436   PHURINE 5.0 02/13/2017 0436   GLUCOSEU NEGATIVE 02/13/2017 0436   HGBUR LARGE (A) 02/13/2017 0436   BILIRUBINUR NEGATIVE 02/13/2017 0436   KETONESUR NEGATIVE 02/13/2017 0436   PROTEINUR NEGATIVE 02/13/2017 0436   UROBILINOGEN 0.2 12/07/2012 1510   NITRITE POSITIVE (A) 02/13/2017 0436   LEUKOCYTESUR TRACE (A) 02/13/2017 0436   Sepsis Labs: _1 (procalcitonin:4,lacticidven:4)  ) Recent Results (from the past 240  hour(s))  Surgical PCR screen     Status: None   Collection Time: 04/09/17 12:48 AM  Result Value Ref Range Status   MRSA, PCR NEGATIVE NEGATIVE Final   Staphylococcus aureus NEGATIVE NEGATIVE Final    Comment: (NOTE) The Xpert SA Assay (FDA approved for NASAL specimens in patients 71 years of age and older), is one component of a comprehensive surveillance program. It is not intended to diagnose infection nor  to guide or monitor treatment. Performed at [facility], [place] [address], [place], [place] [address]   Aerobic/Anaerobic Culture (surgical/deep wound)     Status: None (Preliminary result)   Collection Time: 04/09/17  7:54 AM  Result Value Ref Range Status   Specimen Description ABSCESS LEFT NECK  Final   Special Requests PATIENT ON FOLLOWING ANCEF  Final   Gram Stain   Final    RARE WBC PRESENT, PREDOMINANTLY PMN NO ORGANISMS SEEN Performed at [facility], [place] [address] [place], [place] [address]    Culture PENDING  Incomplete   Report Status PENDING  Incomplete      Radiology Studies: Ct Soft Tissue Neck W Contrast  Result Date: 04/08/2017 CLINICAL DATA:  Initial evaluation for acute left-sided neck swelling for 1 week. History of sepsis, endocarditis. EXAM: CT NECK WITH CONTRAST TECHNIQUE: Multidetector CT imaging of the neck was performed using the standard protocol following the bolus administration of intravenous contrast. CONTRAST:  30m ISOVUE-300 IOPAMIDOL (ISOVUE-300) INJECTION 61% COMPARISON:  Prior CT from 03/26/2017 FINDINGS: Pharynx and larynx: Oral cavity within normal limits without mass lesion or loculated fluid collection. No acute inflammatory changes about the remaining dentition. Palatine tonsils symmetric and within normal limits. Persistent mild induration with inflammatory stranding about the left tonsil, improved relative to previous exam. No discrete tonsillar or peritonsillar abscess. Nasopharynx remains clear. Swelling with induration  again seen within the left parapharyngeal space extending inferiorly to approximately the level of the piriform sinus, improved relative to previous exam. Left piriform sinuses largely effaced. Remainder of the hypopharynx and supraglottic larynx within normal limits. Epiglottis normal. Vallecula clear. True cords within normal limits. Subglottic airway clear. Salivary glands: Parotid glands within normal limits. Induration with inflammatory changes within the left submandibular space related to the inflammatory process within the left neck, improved from previous. Submandibular glands themselves are within normal limits. Thyroid: Thyroid normal. Lymph nodes: Multiple enlarged left level II lymph nodes again seen. There is a new hypodense collection at left level II measuring 1.7 x 2.4 x 2.3 cm, likely a suppurative node with abscess formation (series 3, image 55). Multiple adjacent matted enhancing nodes seen at left level II and III, largest of which measures 7 mm in short axis. Changes are intimately associated with the adjacent left sternocleidomastoid muscle, consistent with myositis. Associated swelling with inflammatory fat stranding within the left submandibular and parapharyngeal spaces, extending inferiorly down the anterior left neck. Overall, swelling, inflammation, and adenopathy is improved relative to previous exam. Shotty subcentimeter right-sided cervical lymph nodes improved as well. Vascular: Normal intravascular enhancement seen throughout the neck. Aberrant right subclavian artery noted. Limited intracranial: Unremarkable. Visualized orbits: Globes and orbital soft tissues within normal limits. Mastoids and visualized paranasal sinuses: Minimal mucosal thickening within the left sphenoid sinus. Visualized paranasal sinuses are otherwise clear. Mastoid air cells and middle ear cavities are well pneumatized and free of fluid. Skeleton: No acute osseous abnormality. No worrisome lytic or blastic  osseous lesions. Upper chest: Mildly enlarged 12 mm right paratracheal node noted, likely reactive. Additional shotty subcentimeter lymph nodes noted within the upper mediastinum. Slight asymmetric soft tissue thickening about the right sternoclavicular joint again noted without significant inflammatory changes. Visualized lungs are grossly clear, although evaluation limited by motion artifact. Other: None. IMPRESSION: 1. Interval improvement in the acute inflammatory process involving the left neck, with decreased swelling and inflammatory changes within the left parapharyngeal and submandibular spaces, with decreased swelling of the left sternocleidomastoid muscle. Findings  consistent with improving cellulitis and myositis. 2. Overall improved left cervical adenopathy, although there is a new 1.7 x 2.4 x 2.3 cm suppurative node/abscess positioned at left level II. Electronically Signed   By: Jeannine Boga M.D.   On: 04/08/2017 14:19     Scheduled Meds: . buprenorphine-naloxone  2 tablet Sublingual Daily  . DULoxetine  60 mg Oral BID  . enoxaparin (LOVENOX) injection  40 mg Subcutaneous Q24H  . ferrous sulfate  325 mg Oral TID WC  . gadobenate dimeglumine  15 mL Intravenous Once  . methocarbamol  500 mg Oral TID  . multivitamin with minerals  1 tablet Oral Daily  . mupirocin ointment  1 application Nasal BID  . nicotine  7 mg Transdermal Q24H  . OLANZapine  5 mg Oral q morning - 10a  . pantoprazole  40 mg Oral BID  . polyethylene glycol  17 g Oral Daily  . potassium chloride  40 mEq Oral BID  . senna-docusate  1 tablet Oral BID   Continuous Infusions: . sodium chloride    . acetaminophen    .  ceFAZolin (ANCEF) IV Stopped (04/09/17 1415)     LOS: 55 days   Time Spent in minutes   10 minutes  Verneita Griffes, MD [facility] [phone]

## 2017-04-09 NOTE — Anesthesia Postprocedure Evaluation (Signed)
Anesthesia Post Note  Patient: [name]  Procedure(s) Performed: INCISION AND DRAINAGE ABSCESS OF NECK (Left Neck)     Patient location during evaluation: PACU Anesthesia Type: General Level of consciousness: awake and alert Pain management: pain level controlled Vital Signs Assessment: post-procedure vital signs reviewed and stable Respiratory status: spontaneous breathing, nonlabored ventilation and respiratory function stable Cardiovascular status: blood pressure returned to baseline and stable Postop Assessment: no apparent nausea or vomiting Anesthetic complications: no    Last Vitals:  Vitals:   04/09/17 0840 04/09/17 0905  BP: 119/83 128/83  Pulse: 70 77  Resp: 19 18  Temp: 36.9 C 36.6 C  SpO2: 97% 98%    Last Pain:  Vitals:   04/09/17 0905  TempSrc: Oral  PainSc:                  Beryle Lathehomas E Brock

## 2017-04-09 NOTE — Progress Notes (Addendum)
Paged attending to make aware I & D complete and patient wanting to get diet order. She just walked to desk to ask again and informed me she had already eaten a cookie. I had told her we needed to check with Dr. Mahala MenghiniSamtani first to be safe.  [facility] who stated she would not put a diet order in but she was fine with regular food.   Awaiting call back from [facility]

## 2017-04-09 NOTE — Progress Notes (Signed)
ENT Post Operative Note   Attempted post operative examination after left neck I&D with penrose drain placement, but patient was not in her room or on the floor. Will re-evaluate drain and left neck on Friday, 3/29. Please change kerlix fluff TID. Will follow wound cultures.   Graylin ShiverAmanda J Marcellino, MD  [facility] Provider Office [phone]

## 2017-04-09 NOTE — Op Note (Signed)
DATE OF PROCEDURE:  04/09/2017    PRE-OPERATIVE DIAGNOSIS:  CELLULITIS OF THE NECK Left neck abscess    POST-OPERATIVE DIAGNOSIS:  Same    PROCEDURE(S): Left neck incision and drainage   SURGEON:  [name], MD    ASSISTANT(S):  none    ANESTHESIA:  General LMA    ESTIMATED BLOOD LOSS: 10mL   SPECIMENS: none   COMPLICATIONS:  None    OPERATIVE FINDINGS: There was fibrinous, infectious debris beginning at the level of the subcutaneous tissue extending down into the sternocleidomastoid muscle. There was thick purulence, appoximately 4cc, in the abscess cavity itself.      OPERATIVE DETAILS: The patient was taken to the operating room and placed in supine position. General anesthesia was induced. An incision was made in the left neck, taking care to place this incision 2 fingerbreadths inferior to the mandible. An incision was made with a 15 blade scalpel. Blunt dissection was then carried down below the platsyma. Fibrinous debris and purulence were immediately obtained. A hemostat was used to open up all loculations. Cultures were obtained. The abscess cavity was copiously irrigated. A 1/4" penrose was placed into the abscess cavity and secured to the skin with a 4-0 Nylon suture. Skin was cleansed and all instrumentation was removed. A Kerlix fluff and burn net were placed. The patient was returned to the care of the anesthesia staff, awakened, and transported to PACU in good condition.

## 2017-04-09 NOTE — Progress Notes (Signed)
Patient disconnected her PICC line to go outside with NT. She was tired of waiting and said she needed to be back for her family visit. Spoke with patient and IV team about asking her not to disconnect herself from the central line. We reiterated the importance of not introducing any infection to her PICC. I told her we were working with her to give her priveleges to leave unit she needed to do her part and work with us.  Also informed she may have been smoking  Again in bathroom. Walked to room and into bathroom and definitely smelled fresh smoke.

## 2017-04-09 NOTE — Progress Notes (Signed)
Pt disconnected her PICC line herself. States she was told by IV team that she could do that. Advised pt that it is in her chart multiple times that she has been educated by IV team as well as multiple nurses that she should not do this. Educated pt ONCE AGAIN that it is ONLY IV team that can disconnect a PICC line. Educated pt that this line is a direct route to her heart for infection if she continues to meddle with the line, and that I would be documenting that we spoke about this and that it should not occur in the future.

## 2017-04-09 NOTE — Progress Notes (Signed)
Speech Language Pathology Discharge Patient Details Name: [name] MRN: [id number] DOB: [birth year] Today's Date: 04/09/2017 Time:  -     Patient discharged from SLP services secondary to goals met and no further SLP needs identified.  Please see latest therapy progress note for current level of functioning and progress toward goals.    Progress and discharge plan discussed with patient and/or caregiver:   GO     Houston Siren 04/09/2017, 3:08 PM

## 2017-04-09 NOTE — Progress Notes (Signed)
ENT Consult Progress Note  Subjective:  Did well overnight. Tmax 97.8.  Left neck erythema continued to be improved but persists with left neck pain and tenderness   Objective: Vitals:   04/08/17 2128 04/09/17 0540  BP: 123/81 124/75  Pulse: 94 98  Resp: 16 18  Temp: 97.8 F (36.6 C) (!) 97.4 F (36.3 C)  SpO2: 97% 99%    Physical Exam: CONSTITUTIONAL: well developed, nourished, no distress  CARDIOVASCULAR: normal rate and regular rhythm PULMONARY/CHEST WALL: effort normal and no stridor, no stertor, no dysphonia HENT: Head : normocephalic and atraumatic  Mouth/Throat:  Mouth: uvula midline and no oral lesions Throat: oropharynx clear and moist  Mucous membranes:improved thickened secretions in oropharynx. No cast-like secretions on examination today EYES: conjunctiva normal, EOM normal and PERRL NECK: Left neck erythema perhaps slightly worse (more erythematous). Neck is a little softer. Full ROM. No fluctuance.    Data Review/Consults/Discussions: CT scan completed yesterday- 2cm left SCM abscess has formed. WBC rising and is now 12.6 (up from 10)  Assessment: [name] 32 y.o. female who presents with left cervical myositis, left cervical adenitis and lymphadenopathy in the setting of IVDA.Now with left neck abscess of the sternocleidomastoid.   Plan:  -Continue abx -Will go to OR for left neck I&D. Risks/benefits explained to patient.    Thank you for involving [facility] in the care of this patient. Should you need further assistance, please call our office at [phone].    [name], MD

## 2017-04-10 ENCOUNTER — Encounter (HOSPITAL_COMMUNITY): Payer: Self-pay | Admitting: Otolaryngology

## 2017-04-10 LAB — BASIC METABOLIC PANEL
Anion gap: 9 (ref 5–15)
BUN: 10 mg/dL (ref 6–20)
CALCIUM: 8.2 mg/dL — AB (ref 8.9–10.3)
CO2: 28 mmol/L (ref 22–32)
CREATININE: 0.6 mg/dL (ref 0.44–1.00)
Chloride: 104 mmol/L (ref 101–111)
GFR calc non Af Amer: >60 mL/min (ref >60)
Glucose, Bld: 90 mg/dL (ref 65–99)
Potassium: 3.4 mmol/L — ABNORMAL LOW (ref 3.5–5.1)
SODIUM: 141 mmol/L (ref 135–145)

## 2017-04-10 LAB — CBC WITH DIFFERENTIAL/PLATELET
BASOS PCT: 0 %
Basophils Absolute: 0 10*3/uL (ref 0.0–0.1)
EOS ABS: 0 10*3/uL (ref 0.0–0.7)
Eosinophils Relative: 0 %
HCT: 28.8 % — ABNORMAL LOW (ref 36.0–46.0)
Hemoglobin: 9.1 g/dL — ABNORMAL LOW (ref 12.0–15.0)
Lymphocytes Relative: 35 %
Lymphs Abs: 4.1 10*3/uL — ABNORMAL HIGH (ref 0.7–4.0)
MCH: 30.6 pg (ref 26.0–34.0)
MCHC: 31.6 g/dL (ref 30.0–36.0)
MCV: 97 fL (ref 78.0–100.0)
MONO ABS: 1 10*3/uL (ref 0.1–1.0)
MONOS PCT: 8 %
NEUTROS PCT: 57 %
Neutro Abs: 6.6 10*3/uL (ref 1.7–7.7)
Platelets: 286 10*3/uL (ref 150–400)
RBC: 2.97 MIL/uL — ABNORMAL LOW (ref 3.87–5.11)
RDW: 18.6 % — AB (ref 11.5–15.5)
WBC: 11.8 10*3/uL — ABNORMAL HIGH (ref 4.0–10.5)

## 2017-04-10 NOTE — Progress Notes (Signed)
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   Patient placed on IV antibiotics and will need to continue IV antibiotics through 04/15/2017.   Patient developed pancytopenia about 2 weeks ago, hematology consulted, thought to be medication related.  Pancytopenia has resolved.  Recent left neck swelling was noted.  Initial CT scan did not reveal an abscess.  Patient was initially started on steroids.  Next swelling has improved, and nontender.  ENT advise repeat CT of the soft tissue of the neck with contrast today, 04/08/2017.  Repeat CT soft tissue of the neck has revealed abscess.  Patient has been kept n.p.o. overnight.  ENT plans to proceed with I&D on 04/09/2017.  Otherwise, no new complaints today, 04/08/2017.  No fever or chills, no shortness of breath, no chest pain.    She has TEE echocardiogram scheduled for Friday, 04/11/2017  AdmittedAssessment & Plan   MSSA bacteremia and tricuspid valve endocarditis -Initially placed on IV nafcillin which was discontinued due to suspected bone marrow suppression -Infectious disease consulted and appreciated, recommended antibiotics through 04/15/2017 -currently on Ancef -Cardiology is consulted and appreciated. PN from Dr. Doylene Canard on 02/27/2017---for repeat TEE 04/11/17 -Patient has PICC line (placed on 03/21/2017) which is to be pulled on completion of antibiotics 04/15/17  Anemia  -patient had bloody/dark stools during this admission -Suspected anemia multifactorial including possible blood loss versus severe systemic infection versus bone marrow suppression versus antibiotics -Hemoglobin stable and still stable in the 9 range--gastroenterology saw and signed off.  Did not feel upper or lower endoscopy was not  warranted given that there was no overt bleeding.  Recommended continuing PPI twice daily.  Left neck swelling, non tender: -Repeat CT Soft tissue neck with contrast -Possibly lymphadenopathy versus more serious pathology. -Previous CT neck showed no drainable fluid -ENT, Dr. Blenda Nicely, consulted and appreciated. ENT suggests repeat CT Soft tissue Neck. -CT soft tissue of the neck revealed an abscess today, 04/08/2017 -s/p I& D 04/09/2017--post op management wound per Dr. Marlowe Alt dressings 3 times a day  Chronic pain management/history of IV drug abuse/ anxiety and depression -Continue Cymbalta, oxycodone, Zyprexa, Tylenol as needed -Currently on Suboxone-do not escalate pain medications or doses   Pancytopenia -Resolved, Likely due to bone marrow suppression in the setting of sepsis/endocarditis -Hematology consulted and appreciated, status post bone marrow biopsy-suggested changes secondary to medication -Continue to monitor CBC (appears to be improving)  Hypokalemia -resolved--- repeat periodically  Asymptomatic bradycardia  -resolved  Left lower extremity edema -Lower extremity Doppler was negative for DVT -Encourage ambulation and elevating leg -Continue to monitor  DVT Prophylaxis  Lovenox  Code Status: Full  Family Communication: Family at bedside  Disposition Plan: Admitted. Will continue antibiotics through 4/2. Dispo home after completion of antibiotics--if something changes will re-involve ID  Consultants ENT Gastroenterology Hematology Cardiology  Infectious disease Interventional radiology (bone marrow biopsy)  Procedures  Lower extremity doppler TEE  Antibiotics   Anti-infectives (From admission, onward)   Start     Dose/Rate Route Frequency Ordered Stop   03/26/17 1200  vancomycin (VANCOCIN) IVPB 750 mg/150 ml premix  Status:  Discontinued     750 mg 150 mL/hr over 60 Minutes Intravenous Every 8 hours 03/26/17 1149 03/27/17 1325    03/22/17 1400  ceFAZolin (ANCEF) IVPB  2g/100 mL premix     2 g 200 mL/hr over 30 Minutes Intravenous Every 8 hours 03/22/17 1125 04/15/17 2359   03/19/17 1400  ceFEPIme (MAXIPIME) 2 g in sodium chloride 0.9 % 100 mL IVPB  Status:  Discontinued     2 g 200 mL/hr over 30 Minutes Intravenous Every 8 hours 03/19/17 1357 03/22/17 1125   03/02/17 2200  linezolid (ZYVOX) tablet 600 mg  Status:  Discontinued     600 mg Oral Every 12 hours 03/02/17 2037 03/03/17 1203   02/24/17 1600  nafcillin 2 g in sodium chloride 0.9 % 100 mL IVPB  Status:  Discontinued     2 g 200 mL/hr over 30 Minutes Intravenous Every 4 hours 02/24/17 0833 03/19/17 1411   02/21/17 2000  nafcillin 2 g in dextrose 5 % 100 mL IVPB  Status:  Discontinued     2 g 200 mL/hr over 30 Minutes Intravenous Every 4 hours 02/21/17 1741 02/24/17 0910   02/21/17 1730  nafcillin injection 2 g  Status:  Discontinued     2 g Intravenous Every 4 hours 02/21/17 1726 02/21/17 1740   02/19/17 1230  ceFAZolin (ANCEF) IVPB 2g/100 mL premix  Status:  Discontinued     2 g 200 mL/hr over 30 Minutes Intravenous Every 8 hours 02/19/17 1148 02/21/17 1726   02/18/17 1330  linezolid (ZYVOX) tablet 600 mg  Status:  Discontinued     600 mg Oral Every 12 hours 02/18/17 1250 02/19/17 1126   02/17/17 0800  sulfamethoxazole-trimethoprim (BACTRIM DS,SEPTRA DS) 800-160 MG per tablet 2 tablet  Status:  Discontinued     2 tablet Oral Every 12 hours 02/16/17 1453 02/18/17 1250   02/16/17 1600  sulfamethoxazole-trimethoprim (BACTRIM DS,SEPTRA DS) 800-160 MG per tablet 2 tablet     2 tablet Oral  Once 02/16/17 1509 02/16/17 1657   02/14/17 1400  ceFAZolin (ANCEF) IVPB 2g/100 mL premix  Status:  Discontinued     2 g 200 mL/hr over 30 Minutes Intravenous Every 8 hours 02/14/17 1013 02/16/17 1453   02/14/17 0200  vancomycin (VANCOCIN) IVPB 750 mg/150 ml premix  Status:  Discontinued     750 mg 150 mL/hr over 60 Minutes Intravenous Every 8 hours 02/13/17 1739 02/14/17  1017   02/14/17 0000  piperacillin-tazobactam (ZOSYN) IVPB 3.375 g  Status:  Discontinued     3.375 g 12.5 mL/hr over 240 Minutes Intravenous Every 8 hours 02/13/17 1739 02/14/17 1013   02/13/17 1715  piperacillin-tazobactam (ZOSYN) IVPB 3.375 g     3.375 g 100 mL/hr over 30 Minutes Intravenous  Once 02/13/17 1701 02/13/17 1824   02/13/17 1715  vancomycin (VANCOCIN) IVPB 1000 mg/200 mL premix  Status:  Discontinued     1,000 mg 200 mL/hr over 60 Minutes Intravenous  Once 02/13/17 1701 02/13/17 1705   02/13/17 1715  vancomycin (VANCOCIN) 1,500 mg in sodium chloride 0.9 % 500 mL IVPB     1,500 mg 250 mL/hr over 120 Minutes Intravenous  Once 02/13/17 1705 02/13/17 2300      Subjective:   Doing fair looks better in no distress eating and drinking Asking whether TEE will be done tomorrow and from my perspective should not be a concern to do the same No further real itching Asking for increase in her BZD meds  Objective:   Vitals:   04/09/17 0840 04/09/17 0905 04/09/17 2134 04/10/17 0541  BP: 119/83 128/83 99/61 (!) 104/54  Pulse: 70 77 97 82  Resp: _0 17  Temp: 98.5 F (36.9 C) 97.9 F (36.6 C) (!) 97.5 F (36.4 C) 98.1 F (36.7 C)  TempSrc:  Oral    SpO2: 97% 98% 97% 100%  Weight:    80.3 kg (177 lb 0.5 oz)  Height:        Intake/Output Summary (Last 24 hours) at 04/10/2017 0850 Last data filed at 04/10/2017 0615 Gross per 24 hour  Intake 300 ml  Output -  Net 300 ml   Filed Weights   04/08/17 0500 04/09/17 0540 04/10/17 0541  Weight: 74.9 kg (165 lb 2 oz) 76.6 kg (168 lb 12.8 oz) 80.3 kg (177 lb 0.5 oz)   Exam  In nad cta b Neck has drain and no redness nor warmth to exam and is unchanged from prior exam s1 s2 no m, RRR Multiple tattoos Neuro intact moves 4 limbs equally sensory grossly intaCT  Data Reviewed: I have personally reviewed following labs and imaging studies  CBC: Recent Labs  Lab 04/05/17 0419 04/07/17 0350 04/10/17 0424  WBC 10.0  12.6* 11.8*  NEUTROABS  --   --  6.6  HGB 9.2* 9.7* 9.1*  HCT 29.1* 30.2* 28.8*  MCV 93.0 91.8 97.0  PLT 272 275 098   Basic Metabolic Panel: Recent Labs  Lab 04/05/17 0419 04/07/17 0350 04/10/17 0424  NA 136 137 141  K 3.6 4.4 3.4*  CL 101 100* 104  CO2 _0 GLUCOSE 101* 190* 90  BUN <5* 6 10  CREATININE 0.64 0.64 0.60  CALCIUM 8.6* 9.4 8.2*  MG 1.8  --   --    GFR: Estimated Creatinine Clearance: 110.1 mL/min (by C-G formula based on SCr of 0.6 mg/dL). Liver Function Tests: No results for input(s): AST, ALT, ALKPHOS, BILITOT, PROT, ALBUMIN in the last 168 hours. No results for input(s): LIPASE, AMYLASE in the last 168 hours. No results for input(s): AMMONIA in the last 168 hours. Coagulation Profile: No results for input(s): INR, PROTIME in the last 168 hours. Cardiac Enzymes: No results for input(s): CKTOTAL, CKMB, CKMBINDEX, TROPONINI in the last 168 hours. BNP (last 3 results) No results for input(s): PROBNP in the last 8760 hours. HbA1C: No results for input(s): HGBA1C in the last 72 hours. CBG: Recent Labs  Lab 04/09/17 1728  GLUCAP 83   Lipid Profile: No results for input(s): CHOL, HDL, LDLCALC, TRIG, CHOLHDL, LDLDIRECT in the last 72 hours. Thyroid Function Tests: No results for input(s): TSH, T4TOTAL, FREET4, T3FREE, THYROIDAB in the last 72 hours. Anemia Panel: No results for input(s): VITAMINB12, FOLATE, FERRITIN, TIBC, IRON, RETICCTPCT in the last 72 hours. Urine analysis:    Component Value Date/Time   COLORURINE AMBER (A) 02/13/2017 0436   APPEARANCEUR HAZY (A) 02/13/2017 0436   LABSPEC 1.014 02/13/2017 0436   PHURINE 5.0 02/13/2017 0436   GLUCOSEU NEGATIVE 02/13/2017 0436   HGBUR LARGE (A) 02/13/2017 0436   BILIRUBINUR NEGATIVE 02/13/2017 0436   KETONESUR NEGATIVE 02/13/2017 0436   PROTEINUR NEGATIVE 02/13/2017 0436   UROBILINOGEN 0.2 12/07/2012 1510   NITRITE POSITIVE (A) 02/13/2017 0436   LEUKOCYTESUR TRACE (A) 02/13/2017 0436    Sepsis Labs: _1 (procalcitonin:4,lacticidven:4)  ) Recent Results (from the past 240 hour(s))  Surgical PCR screen     Status: None   Collection Time: 04/09/17 12:48 AM  Result Value Ref Range Status   MRSA, PCR NEGATIVE NEGATIVE Final   Staphylococcus aureus NEGATIVE NEGATIVE Final    Comment: (NOTE) The Xpert SA Assay (FDA approved for NASAL specimens in patients 22  years of age and older), is one component of a comprehensive surveillance program. It is not intended to diagnose infection nor to guide or monitor treatment. Performed at [facility], [place] [address], [place], [place] [address]   Aerobic/Anaerobic Culture (surgical/deep wound)     Status: None (Preliminary result)   Collection Time: 04/09/17  7:54 AM  Result Value Ref Range Status   Specimen Description ABSCESS LEFT NECK  Final   Special Requests PATIENT ON FOLLOWING ANCEF  Final   Gram Stain   Final    RARE WBC PRESENT, PREDOMINANTLY PMN NO ORGANISMS SEEN Performed at [facility], [place] [address], [place], [place] [address]    Culture PENDING  Incomplete   Report Status PENDING  Incomplete      Radiology Studies: Ct Soft Tissue Neck W Contrast  Result Date: 04/08/2017 CLINICAL DATA:  Initial evaluation for acute left-sided neck swelling for 1 week. History of sepsis, endocarditis. EXAM: CT NECK WITH CONTRAST TECHNIQUE: Multidetector CT imaging of the neck was performed using the standard protocol following the bolus administration of intravenous contrast. CONTRAST:  17m ISOVUE-300 IOPAMIDOL (ISOVUE-300) INJECTION 61% COMPARISON:  Prior CT from 03/26/2017 FINDINGS: Pharynx and larynx: Oral cavity within normal limits without mass lesion or loculated fluid collection. No acute inflammatory changes about the remaining dentition. Palatine tonsils symmetric and within normal limits. Persistent mild induration with inflammatory stranding about the left tonsil, improved relative to previous exam.  No discrete tonsillar or peritonsillar abscess. Nasopharynx remains clear. Swelling with induration again seen within the left parapharyngeal space extending inferiorly to approximately the level of the piriform sinus, improved relative to previous exam. Left piriform sinuses largely effaced. Remainder of the hypopharynx and supraglottic larynx within normal limits. Epiglottis normal. Vallecula clear. True cords within normal limits. Subglottic airway clear. Salivary glands: Parotid glands within normal limits. Induration with inflammatory changes within the left submandibular space related to the inflammatory process within the left neck, improved from previous. Submandibular glands themselves are within normal limits. Thyroid: Thyroid normal. Lymph nodes: Multiple enlarged left level II lymph nodes again seen. There is a new hypodense collection at left level II measuring 1.7 x 2.4 x 2.3 cm, likely a suppurative node with abscess formation (series 3, image 55). Multiple adjacent matted enhancing nodes seen at left level II and III, largest of which measures 7 mm in short axis. Changes are intimately associated with the adjacent left sternocleidomastoid muscle, consistent with myositis. Associated swelling with inflammatory fat stranding within the left submandibular and parapharyngeal spaces, extending inferiorly down the anterior left neck. Overall, swelling, inflammation, and adenopathy is improved relative to previous exam. Shotty subcentimeter right-sided cervical lymph nodes improved as well. Vascular: Normal intravascular enhancement seen throughout the neck. Aberrant right subclavian artery noted. Limited intracranial: Unremarkable. Visualized orbits: Globes and orbital soft tissues within normal limits. Mastoids and visualized paranasal sinuses: Minimal mucosal thickening within the left sphenoid sinus. Visualized paranasal sinuses are otherwise clear. Mastoid air cells and middle ear cavities are well  pneumatized and free of fluid. Skeleton: No acute osseous abnormality. No worrisome lytic or blastic osseous lesions. Upper chest: Mildly enlarged 12 mm right paratracheal node noted, likely reactive. Additional shotty subcentimeter lymph nodes noted within the upper mediastinum. Slight asymmetric soft tissue thickening about the right sternoclavicular joint again noted without significant inflammatory changes. Visualized lungs are grossly clear, although evaluation limited by motion artifact. Other: None. IMPRESSION: 1. Interval improvement in the acute inflammatory process involving the left neck, with  decreased swelling and inflammatory changes within the left parapharyngeal and submandibular spaces, with decreased swelling of the left sternocleidomastoid muscle. Findings consistent with improving cellulitis and myositis. 2. Overall improved left cervical adenopathy, although there is a new 1.7 x 2.4 x 2.3 cm suppurative node/abscess positioned at left level II. Electronically Signed   By: Jeannine Boga M.D.   On: 04/08/2017 14:19     Scheduled Meds: . buprenorphine-naloxone  2 tablet Sublingual Daily  . DULoxetine  60 mg Oral BID  . enoxaparin (LOVENOX) injection  40 mg Subcutaneous Q24H  . ferrous sulfate  325 mg Oral TID WC  . gadobenate dimeglumine  15 mL Intravenous Once  . methocarbamol  500 mg Oral TID  . multivitamin with minerals  1 tablet Oral Daily  . nicotine  7 mg Transdermal Q24H  . OLANZapine  5 mg Oral q morning - 10a  . pantoprazole  40 mg Oral BID  . polyethylene glycol  17 g Oral Daily  . potassium chloride  40 mEq Oral BID  . senna-docusate  1 tablet Oral BID   Continuous Infusions: . sodium chloride    .  ceFAZolin (ANCEF) IV Stopped (04/10/17 0649)     LOS: 56 days   Time Spent in minutes   10 minutes  Verneita Griffes, MD [facility] [phone]

## 2017-04-10 NOTE — H&P (View-Only) (Signed)
Ref: Pearson GrippeKim, James, MD   Subjective:  No new complaints. 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 Awaiting TEE in AM.  Objective:  Vital Signs in the last 24 hours: Temp:  [97.5 F (36.4 C)-98.1 F (36.7 C)] 98.1 F (36.7 C) (03/28 1354) Pulse Rate:  [82-102] 102 (03/28 1354) Resp:  [17-18] 17 (03/28 0541) BP: (93-104)/(54-61) 93/57 (03/28 1354) SpO2:  [97 %-100 %] 100 % (03/28 1354) Weight:  [80.3 kg (177 lb 0.5 oz)] 80.3 kg (177 lb 0.5 oz) (03/28 0541)  Physical Exam: BP Readings from Last 1 Encounters:  04/10/17 (!) 93/57     Wt Readings from Last 1 Encounters:  04/10/17 80.3 kg (177 lb 0.5 oz)    Weight change: 3.733 kg (8 lb 3.7 oz) Body mass index is 27.73 kg/m. HEENT: [place]/AT, Eyes-Blue, PERL, EOMI, Conjunctiva-Pale pink, Sclera-Non-icteric Neck: No JVD, No bruit, Trachea midline. Lungs:  Clear, Bilateral. Cardiac:  Regular rhythm, normal S1 and S2, no S3. III/VI systolic murmur. Abdomen:  Soft, non-tender. BS present. Extremities:  No edema present. No cyanosis. No clubbing. CNS: AxOx3, Cranial nerves grossly intact, moves all 4 extremities.  Skin: Warm and dry.   Intake/Output from previous day: 03/27 0701 - 03/28 0700 In: 650 [I.V.:350; IV Piggyback:300] Out: 10 [Blood:10]    Lab Results: BMET    Component Value Date/Time   NA 141 04/10/2017 0424   NA 137 04/07/2017 0350   NA 136 04/05/2017 0419   K 3.4 (L) 04/10/2017 0424   K 4.4 04/07/2017 0350   K 3.6 04/05/2017 0419   CL 104 04/10/2017 0424   CL 100 (L) 04/07/2017 0350   CL 101 04/05/2017 0419   CO2 28 04/10/2017 0424   CO2 27 04/07/2017 0350   CO2 28 04/05/2017 0419   GLUCOSE 90 04/10/2017 0424   GLUCOSE 190 (H) 04/07/2017 0350   GLUCOSE 101 (H) 04/05/2017 0419   BUN 10 04/10/2017 0424   BUN 6 04/07/2017 0350   BUN <5 (L) 04/05/2017 0419   CREATININE 0.60 04/10/2017 0424   CREATININE 0.64 04/07/2017 0350   CREATININE 0.64 04/05/2017 0419   CALCIUM 8.2 (L) 04/10/2017  0424   CALCIUM 9.4 04/07/2017 0350   CALCIUM 8.6 (L) 04/05/2017 0419   GFRNONAA >60 04/10/2017 0424   GFRNONAA >60 04/07/2017 0350   GFRNONAA >60 04/05/2017 0419   GFRAA >60 04/10/2017 0424   GFRAA >60 04/07/2017 0350   GFRAA >60 04/05/2017 0419   CBC    Component Value Date/Time   WBC 11.8 (H) 04/10/2017 0424   RBC 2.97 (L) 04/10/2017 0424   HGB 9.1 (L) 04/10/2017 0424   HCT 28.8 (L) 04/10/2017 0424   PLT 286 04/10/2017 0424   MCV 97.0 04/10/2017 0424   MCH 30.6 04/10/2017 0424   MCHC 31.6 04/10/2017 0424   RDW 18.6 (H) 04/10/2017 0424   LYMPHSABS 4.1 (H) 04/10/2017 0424   MONOABS 1.0 04/10/2017 0424   EOSABS 0.0 04/10/2017 0424   BASOSABS 0.0 04/10/2017 0424   HEPATIC Function Panel Recent Labs    02/16/17 0252 02/18/17 0503 03/22/17 0224  PROT 5.1* 6.1* 5.8*   HEMOGLOBIN A1C No components found for: HGA1C,  MPG CARDIAC ENZYMES Lab Results  Component Value Date   TROPONINI <0.03 02/14/2017   TROPONINI <0.03 02/13/2017   TROPONINI <0.03 02/13/2017   BNP No results for input(s): PROBNP in the last 8760 hours. TSH No results for input(s): TSH in the last 8760 hours. CHOLESTEROL No results for input(s): CHOL in  the last 8760 hours.  Scheduled Meds: . buprenorphine-naloxone  2 tablet Sublingual Daily  . DULoxetine  60 mg Oral BID  . enoxaparin (LOVENOX) injection  40 mg Subcutaneous Q24H  . ferrous sulfate  325 mg Oral TID WC  . gadobenate dimeglumine  15 mL Intravenous Once  . methocarbamol  500 mg Oral TID  . multivitamin with minerals  1 tablet Oral Daily  . nicotine  7 mg Transdermal Q24H  . OLANZapine  5 mg Oral q morning - 10a  . pantoprazole  40 mg Oral BID  . polyethylene glycol  17 g Oral Daily  . potassium chloride  40 mEq Oral BID  . senna-docusate  1 tablet Oral BID   Continuous Infusions: . sodium chloride    .  ceFAZolin (ANCEF) IV 2 g (04/10/17 1521)   PRN Meds:.acetaminophen, ALPRAZolam, guaiFENesin, magic mouthwash, naLOXone (NARCAN)   injection, ondansetron **OR** ondansetron (ZOFRAN) IV, oxyCODONE, phenol, RESOURCE THICKENUP CLEAR, sodium chloride, sodium chloride flush, zolpidem  Assessment/Plan: MSSA bacteremia TV endocarditis Polysubstance abuse history Anemia Depression  TEE in AM. Patient understood the procedure and risks.    LOS: 56 days    Orpah Cobb  MD  04/10/2017, 6:41 PM

## 2017-04-10 NOTE — Anesthesia Preprocedure Evaluation (Addendum)
Anesthesia Evaluation  Patient identified by MRN, date of birth, ID band Patient awake    Reviewed: Allergy & Precautions, NPO status , Patient's Chart, lab work & pertinent test results  Airway Mallampati: III  TM Distance: >3 FB Neck ROM: Full    Dental  (+) Upper Dentures, Partial Lower, Poor Dentition   Pulmonary asthma , Current Smoker,    Pulmonary exam normal breath sounds clear to auscultation       Cardiovascular +CHF and + DOE  + Valvular Problems/Murmurs  Rhythm:Regular Rate:Normal + Systolic murmurs Endocarditis Hx/o PFO Moderate to severe TR  Mobile mass attached to anterior leaflet of Tricuspid valve   Neuro/Psych  Headaches, PSYCHIATRIC DISORDERS Anxiety  Neuromuscular disease    GI/Hepatic GERD  Medicated and Controlled,(+)     substance abuse  IV drug use, Hx/o opioid abuse -quit 2014 on suboxone   Endo/Other  negative endocrine ROS  Renal/GU negative Renal ROS  negative genitourinary   Musculoskeletal  (+) Arthritis , Osteoarthritis,  Fibromyalgia -  Abdominal   Peds  Hematology  (+) anemia , Hx/o sepsis   Anesthesia Other Findings   Reproductive/Obstetrics                            Anesthesia Physical Anesthesia Plan  ASA: III  Anesthesia Plan: General   Post-op Pain Management:    Induction: Intravenous  PONV Risk Score and Plan: 2 and Midazolam, Propofol infusion, Ondansetron and Treatment may vary due to age or medical condition  Airway Management Planned: Nasal Cannula  Additional Equipment:   Intra-op Plan:   Post-operative Plan:   Informed Consent: I have reviewed the patients History and Physical, chart, labs and discussed the procedure including the risks, benefits and alternatives for the proposed anesthesia with the patient or authorized representative who has indicated his/her understanding and acceptance.   Dental advisory given  Plan  Discussed with: Anesthesiologist, CRNA and Surgeon  Anesthesia Plan Comments:        Anesthesia Quick Evaluation

## 2017-04-10 NOTE — Consult Note (Signed)
Ref: Pearson GrippeKim, James, MD   Subjective:  No new complaints. 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 Awaiting TEE in AM.  Objective:  Vital Signs in the last 24 hours: Temp:  [97.5 F (36.4 C)-98.1 F (36.7 C)] 98.1 F (36.7 C) (03/28 1354) Pulse Rate:  [82-102] 102 (03/28 1354) Resp:  [17-18] 17 (03/28 0541) BP: (93-104)/(54-61) 93/57 (03/28 1354) SpO2:  [97 %-100 %] 100 % (03/28 1354) Weight:  [80.3 kg (177 lb 0.5 oz)] 80.3 kg (177 lb 0.5 oz) (03/28 0541)  Physical Exam: BP Readings from Last 1 Encounters:  04/10/17 (!) 93/57     Wt Readings from Last 1 Encounters:  04/10/17 80.3 kg (177 lb 0.5 oz)    Weight change: 3.733 kg (8 lb 3.7 oz) Body mass index is 27.73 kg/m. HEENT: [place]/AT, Eyes-Blue, PERL, EOMI, Conjunctiva-Pale pink, Sclera-Non-icteric Neck: No JVD, No bruit, Trachea midline. Lungs:  Clear, Bilateral. Cardiac:  Regular rhythm, normal S1 and S2, no S3. III/VI systolic murmur. Abdomen:  Soft, non-tender. BS present. Extremities:  No edema present. No cyanosis. No clubbing. CNS: AxOx3, Cranial nerves grossly intact, moves all 4 extremities.  Skin: Warm and dry.   Intake/Output from previous day: 03/27 0701 - 03/28 0700 In: 650 [I.V.:350; IV Piggyback:300] Out: 10 [Blood:10]    Lab Results: BMET    Component Value Date/Time   NA 141 04/10/2017 0424   NA 137 04/07/2017 0350   NA 136 04/05/2017 0419   K 3.4 (L) 04/10/2017 0424   K 4.4 04/07/2017 0350   K 3.6 04/05/2017 0419   CL 104 04/10/2017 0424   CL 100 (L) 04/07/2017 0350   CL 101 04/05/2017 0419   CO2 28 04/10/2017 0424   CO2 27 04/07/2017 0350   CO2 28 04/05/2017 0419   GLUCOSE 90 04/10/2017 0424   GLUCOSE 190 (H) 04/07/2017 0350   GLUCOSE 101 (H) 04/05/2017 0419   BUN 10 04/10/2017 0424   BUN 6 04/07/2017 0350   BUN <5 (L) 04/05/2017 0419   CREATININE 0.60 04/10/2017 0424   CREATININE 0.64 04/07/2017 0350   CREATININE 0.64 04/05/2017 0419   CALCIUM 8.2 (L) 04/10/2017  0424   CALCIUM 9.4 04/07/2017 0350   CALCIUM 8.6 (L) 04/05/2017 0419   GFRNONAA >60 04/10/2017 0424   GFRNONAA >60 04/07/2017 0350   GFRNONAA >60 04/05/2017 0419   GFRAA >60 04/10/2017 0424   GFRAA >60 04/07/2017 0350   GFRAA >60 04/05/2017 0419   CBC    Component Value Date/Time   WBC 11.8 (H) 04/10/2017 0424   RBC 2.97 (L) 04/10/2017 0424   HGB 9.1 (L) 04/10/2017 0424   HCT 28.8 (L) 04/10/2017 0424   PLT 286 04/10/2017 0424   MCV 97.0 04/10/2017 0424   MCH 30.6 04/10/2017 0424   MCHC 31.6 04/10/2017 0424   RDW 18.6 (H) 04/10/2017 0424   LYMPHSABS 4.1 (H) 04/10/2017 0424   MONOABS 1.0 04/10/2017 0424   EOSABS 0.0 04/10/2017 0424   BASOSABS 0.0 04/10/2017 0424   HEPATIC Function Panel Recent Labs    02/16/17 0252 02/18/17 0503 03/22/17 0224  PROT 5.1* 6.1* 5.8*   HEMOGLOBIN A1C No components found for: HGA1C,  MPG CARDIAC ENZYMES Lab Results  Component Value Date   TROPONINI <0.03 02/14/2017   TROPONINI <0.03 02/13/2017   TROPONINI <0.03 02/13/2017   BNP No results for input(s): PROBNP in the last 8760 hours. TSH No results for input(s): TSH in the last 8760 hours. CHOLESTEROL No results for input(s): CHOL in  the last 8760 hours.  Scheduled Meds: . buprenorphine-naloxone  2 tablet Sublingual Daily  . DULoxetine  60 mg Oral BID  . enoxaparin (LOVENOX) injection  40 mg Subcutaneous Q24H  . ferrous sulfate  325 mg Oral TID WC  . gadobenate dimeglumine  15 mL Intravenous Once  . methocarbamol  500 mg Oral TID  . multivitamin with minerals  1 tablet Oral Daily  . nicotine  7 mg Transdermal Q24H  . OLANZapine  5 mg Oral q morning - 10a  . pantoprazole  40 mg Oral BID  . polyethylene glycol  17 g Oral Daily  . potassium chloride  40 mEq Oral BID  . senna-docusate  1 tablet Oral BID   Continuous Infusions: . sodium chloride    .  ceFAZolin (ANCEF) IV 2 g (04/10/17 1521)   PRN Meds:.acetaminophen, ALPRAZolam, guaiFENesin, magic mouthwash, naLOXone (NARCAN)   injection, ondansetron **OR** ondansetron (ZOFRAN) IV, oxyCODONE, phenol, RESOURCE THICKENUP CLEAR, sodium chloride, sodium chloride flush, zolpidem  Assessment/Plan: MSSA bacteremia TV endocarditis Polysubstance abuse history Anemia Depression  TEE in AM. Patient understood the procedure and risks.    LOS: 56 days    Orpah Cobb  MD  04/10/2017, 6:41 PM

## 2017-04-11 ENCOUNTER — Inpatient Hospital Stay (HOSPITAL_COMMUNITY): Payer: Medicaid Other | Admitting: Anesthesiology

## 2017-04-11 ENCOUNTER — Encounter (HOSPITAL_COMMUNITY): Payer: Self-pay | Admitting: Certified Registered"

## 2017-04-11 ENCOUNTER — Inpatient Hospital Stay (HOSPITAL_COMMUNITY): Payer: Medicaid Other

## 2017-04-11 ENCOUNTER — Encounter (HOSPITAL_COMMUNITY)
Admission: EM | Disposition: A | Discharge status: Home / Self Care | Payer: Self-pay | Source: Home / Self Care | Attending: Internal Medicine

## 2017-04-11 DIAGNOSIS — L0211 Cutaneous abscess of neck: Secondary | ICD-10-CM

## 2017-04-11 DIAGNOSIS — Z1629 Resistance to other single specified antibiotic: Secondary | ICD-10-CM

## 2017-04-11 DIAGNOSIS — B9689 Other specified bacterial agents as the cause of diseases classified elsewhere: Secondary | ICD-10-CM

## 2017-04-11 HISTORY — PX: TEE WITHOUT CARDIOVERSION: SHX5443

## 2017-04-11 LAB — PROTIME-INR
INR: 1.05
PROTHROMBIN TIME: 13.6 s (ref 11.4–15.2)

## 2017-04-11 LAB — BASIC METABOLIC PANEL
Anion gap: 8 (ref 5–15)
BUN: 8 mg/dL (ref 6–20)
CO2: 28 mmol/L (ref 22–32)
CREATININE: 0.56 mg/dL (ref 0.44–1.00)
Calcium: 8.5 mg/dL — ABNORMAL LOW (ref 8.9–10.3)
Chloride: 102 mmol/L (ref 101–111)
GFR calc Af Amer: >60 mL/min (ref >60)
GFR calc non Af Amer: >60 mL/min (ref >60)
Glucose, Bld: 88 mg/dL (ref 65–99)
POTASSIUM: 3.9 mmol/L (ref 3.5–5.1)
SODIUM: 138 mmol/L (ref 135–145)

## 2017-04-11 SURGERY — ECHOCARDIOGRAM, TRANSESOPHAGEAL
Anesthesia: General

## 2017-04-11 MED ORDER — GLYCOPYRROLATE 0.2 MG/ML IV SOSY
PREFILLED_SYRINGE | INTRAVENOUS | Status: DC | PRN
  Administered 2017-04-11: .2 mg via INTRAVENOUS

## 2017-04-11 MED ORDER — LIDOCAINE 2% (20 MG/ML) 5 ML SYRINGE
INTRAMUSCULAR | Status: DC | PRN
  Administered 2017-04-11: 100 mg via INTRAVENOUS

## 2017-04-11 MED ORDER — SODIUM CHLORIDE 0.9 % IV SOLN
2.0000 g | INTRAVENOUS | Status: DC
  Administered 2017-04-11 – 2017-04-13 (×3): 2 g via INTRAVENOUS
  Filled 2017-04-11 (×5): qty 20

## 2017-04-11 MED ORDER — PROPOFOL 500 MG/50ML IV EMUL
INTRAVENOUS | Status: DC | PRN
  Administered 2017-04-11: 200 ug/kg/min via INTRAVENOUS

## 2017-04-11 MED ORDER — PROPOFOL 10 MG/ML IV BOLUS
INTRAVENOUS | Status: DC | PRN
  Administered 2017-04-11: 50 mg via INTRAVENOUS
  Administered 2017-04-11: 75 mg via INTRAVENOUS
  Administered 2017-04-11: 30 mg via INTRAVENOUS

## 2017-04-11 MED ORDER — MIDAZOLAM HCL 2 MG/2ML IJ SOLN
INTRAMUSCULAR | Status: DC | PRN
  Administered 2017-04-11: 2 mg via INTRAVENOUS

## 2017-04-11 MED ORDER — ONDANSETRON HCL 4 MG/2ML IJ SOLN
INTRAMUSCULAR | Status: DC | PRN
  Administered 2017-04-11: 4 mg via INTRAVENOUS

## 2017-04-11 MED ORDER — BUTAMBEN-TETRACAINE-BENZOCAINE 2-2-14 % EX AERO
INHALATION_SPRAY | CUTANEOUS | Status: DC | PRN
  Administered 2017-04-11: 1 via TOPICAL

## 2017-04-11 MED ORDER — SULFAMETHOXAZOLE-TRIMETHOPRIM 800-160 MG PO TABS: 1.0000 | ORAL_TABLET | Freq: Two times a day (BID) | ORAL | Status: DC

## 2017-04-11 MED ORDER — SODIUM CHLORIDE 0.9 % IV SOLN: INTRAVENOUS | Status: DC

## 2017-04-11 NOTE — CV Procedure (Signed)
INDICATIONS:   The patient is 32 year old female with MSSA bacteremia and known TV endocarditis.  PROCEDURE:  Informed consent was discussed including risks, benefits and alternatives for the procedure.  Risks include, but are not limited to, cough, sore throat, vomiting, nausea, somnolence, esophageal and stomach trauma or perforation, bleeding, low blood pressure, aspiration, pneumonia, infection, trauma to the teeth and death.    Patient was given deep sedation.  The oropharynx was anesthetized with topical lidocaine.  The transesophageal probe was inserted in the esophagus and stomach and multiple views were obtained.  Agitated saline was used after the transesophageal probe was removed from the body.  The patient was kept under observation until the patient left the procedure room.  The patient left the procedure room in stable condition.   COMPLICATIONS:  There were no immediate complications.  FINDINGS:  1. LEFT VENTRICLE: The left ventricle is normal in structure and function.  Wall motion is normal.  No thrombus or masses seen in the left ventricle.  2. RIGHT VENTRICLE:  The right ventricle is mildly dilated and has normal function without any thrombus or masses.    3. LEFT ATRIUM:  The left atrium is normal without any thrombus or masses.  4. LEFT ATRIAL APPENDAGE:  The left atrial appendage is free of any thrombus or masses.  5. RIGHT ATRIUM:  The right atrium is free of any thrombus. Mobile catheter seen.    6. ATRIAL SEPTUM:  The atrial septum has ASD or PFO by sonicated saline injection.  7. MITRAL VALVE:  The mitral valve is normal in structure and function mild multiple jets regurgitation, no masses, stenosis or vegetations.  8. TRICUSPID VALVE:  The tricuspid valve appears to have perforation and has severe regurgitation with mobile vegetations.  9. AORTIC VALVE:  The aortic valve is normal in structure and function without regurgitation, masses, stenosis or  vegetations.  10. PULMONIC VALVE:  The pulmonic valve is normal in structure and function without significant regurgitation, masses, stenosis or vegetations.  11. AORTIC ARCH, ASCENDING AND DESCENDING AORTA:  The aorta had no significant atherosclerosis in the ascending or descending aorta.  The aortic arch was normal.  12.  Superior Vena Cava : Catheter seen.  13.  Pulmonary Veins: Visible.  14.  Pulmonary artery: visible and normal.   IMPRESSION:   1. Mobile vegetation on TV. 2. Severe TR with perforation. 3. Mild MR. 4. Positive PFO 5. Normal LV systolic function.  RECOMMENDATIONS:    Continue antibiotic treatment per infectious disease.

## 2017-04-11 NOTE — Anesthesia Postprocedure Evaluation (Addendum)
Anesthesia Post Note  Patient: [name]  Procedure(s) Performed: TRANSESOPHAGEAL ECHOCARDIOGRAM (TEE) (N/[name] )     Patient location during evaluation: PACU Anesthesia Type: General Level of consciousness: awake and alert Pain management: pain level controlled Vital Signs Assessment: post-procedure vital signs reviewed and stable Respiratory status: spontaneous breathing, nonlabored ventilation and respiratory function stable Cardiovascular status: blood pressure returned to baseline and stable Postop Assessment: no apparent nausea or vomiting Anesthetic complications: no    Last Vitals:  Vitals:   04/11/17 0855 04/11/17 0900  BP: 107/71   Pulse: 97   Resp: 17   Temp:    SpO2: 100% 99%    Last Pain:  Vitals:   04/11/17 0855  TempSrc:   PainSc: 0-No pain                 [name].

## 2017-04-11 NOTE — Progress Notes (Signed)
ENT Progress Note  Subjective: Left neck edema and erythema much improved. Afebrile.  Underwent TEE earlier this morning- continues with vegetations, will await blood cultures  Vitals:   04/11/17 0900 04/11/17 1322  BP:  97/65  Pulse:  91  Resp:    Temp:  98.1 F (36.7 C)  SpO2: 99% 98%     OBJECTIVE  Gen: alert, cooperative, appropriate Head/ENT: EOMI, neck supple, mucus membranes moist and pink, conjunctiva clear Left neck penrose with minimal purulence, some serosanguinous drainage. Neck is less erythematous, less edema. Tenderness is improving.  Drain removed.  Face moves symmetrically    ASSESS/ PLAN  [name] is a 32 y.o. female who is POD 2 from left neck I&D.  -Discussed with RN- please pack left neck with 1/4" strip gauze twice daily. Patient should be educated in how to do this- may do this at home. Pack BID until packing no longer fits into wound.  -Followup with Dr. Doran HeaterMarcellino in 2 weeks.  -Abx per Infectious Disease  Dr. Darliss RidgelAmanda Jo [facility]  [facility] Provider [facility] [phone]

## 2017-04-11 NOTE — Anesthesia Procedure Notes (Signed)
Procedure Name: MAC Date/Time: 04/11/2017 8:20 AM Performed by: Imagene Riches, CRNA Pre-anesthesia Checklist: Patient identified, Emergency Drugs available, Suction available and Patient being monitored Patient Re-evaluated:Patient Re-evaluated prior to induction Oxygen Delivery Method: Nasal cannula Preoxygenation: Pre-oxygenation with 100% oxygen

## 2017-04-11 NOTE — Progress Notes (Signed)
Patient back from Endo. Alert and oriented, complaining of neck pain. Will continue to monitor.

## 2017-04-11 NOTE — Progress Notes (Signed)
  Echocardiogram Echocardiogram Transesophageal has been performed.  [name], [name] F 04/11/2017, 9:24 AM

## 2017-04-11 NOTE — Progress Notes (Signed)
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   Patient placed on IV antibiotics and will need to continue IV antibiotics through 04/15/2017.   Patient developed pancytopenia about 2 weeks ago, hematology consulted, thought to be medication related.  Pancytopenia has resolved.  Recent left neck swelling was noted.  Initial CT scan did not reveal an abscess.  Patient was initially started on steroids.  Next swelling has improved, and nontender.  ENT advise repeat CT of the soft tissue of the neck with contrast today, 04/08/2017.  Repeat CT soft tissue of the neck has revealed abscess.  Patient has been kept n.p.o. overnight.  ENT plans to proceed with I&D on 04/09/2017.  Otherwise, no new complaints today, 04/08/2017.  No fever or chills, no shortness of breath, no chest pain.    She has TEE echocardiogram scheduled for Friday, 04/11/2017  AdmittedAssessment & Plan   MSSA bacteremia and tricuspid valve endocarditis -Initially placed on IV nafcillin which was discontinued due to suspected bone marrow suppression -Infectious disease consulted and appreciated, recommended antibiotics through 04/15/2017 -currently on Ancef -Cardiology Dr. Doylene Canard repeat TEE 04/11/17--shows mobile vegetation tricuspid valve severe TR with perforation mild MR and positive for PFO---will need outpatient management with CVTS going forward for consideration of the same -Patient has PICC line (placed on 03/21/2017) which is to be pulled on completion of antibiotics 04/15/17  Anemia  -patient had bloody/dark stools during this admission -Suspected anemia multifactorial including possible blood loss versus severe systemic infection versus bone marrow suppression versus  antibiotics -Hemoglobin stable and still stable in the 9 range--gastroenterology saw and signed off.  Did not feel upper or lower endoscopy was not warranted given that there was no overt bleeding.  Recommended continuing PPI twice daily.  Left neck swelling, positive for abscess-- I&D abscess 3/26 Wound culture growing Serratia resistant to cefazolin -ENT, Dr. Blenda Nicely, consulted and appreciated.  -s/p I& D 04/09/2017--post op management wound per Dr. Marlowe Alt dressings 3 times a day --started on Bactrim DS X 5 days ending 4/2  --Neck wound to be packed with 1/4 no family present today inch strip gauze twice daily until no longer fitting  Chronic pain management/history of IV drug abuse/ anxiety and depression -Continue Cymbalta, oxycodone, Zyprexa, Tylenol as needed -Currently on Suboxone-do not escalate pain medications or doses   Pancytopenia -Resolved, Likely due to bone marrow suppression in the setting of sepsis/endocarditis -Hematology consulted and appreciated, status post bone marrow biopsy-suggested changes secondary to medication -Continue to monitor CBC (appears to be improving)  Hypokalemia -resolved--- repeat periodically  Asymptomatic bradycardia  -resolved  Left lower extremity edema -Lower extremity Doppler was negative for DVT -Encourage ambulation and elevating leg -Continue to monitor  DVT Prophylaxis  Lovenox  Code Status: Full   Family Communication: e no family is currently at bedside today  Disposition Plan: Admitted. Will continue antibiotics through 4/2.  Appreciate ID and cardiology input will need CVTS for further follow-up non-emergently as outpatient awake alert at nursing station walking around no distress Has many questions about PFO and  Consultants ENT Gastroenterology Hematology Cardiology  Infectious disease Interventional radiology (bone marrow biopsy)  Procedures  Lower extremity doppler TEE  repeat TEE--3/29 findings  as above  Antibiotics   Anti-infectives (From admission, onward)  Start     Dose/Rate Route Frequency Ordered Stop   03/26/17 1200  vancomycin (VANCOCIN) IVPB 750 mg/150 ml premix  Status:  Discontinued     750 mg 150 mL/hr over 60 Minutes Intravenous Every 8 hours 03/26/17 1149 03/27/17 1325   03/22/17 1400  ceFAZolin (ANCEF) IVPB 2g/100 mL premix     2 g 200 mL/hr over 30 Minutes Intravenous Every 8 hours 03/22/17 1125 04/15/17 2359   03/19/17 1400  ceFEPIme (MAXIPIME) 2 g in sodium chloride 0.9 % 100 mL IVPB  Status:  Discontinued     2 g 200 mL/hr over 30 Minutes Intravenous Every 8 hours 03/19/17 1357 03/22/17 1125   03/02/17 2200  linezolid (ZYVOX) tablet 600 mg  Status:  Discontinued     600 mg Oral Every 12 hours 03/02/17 2037 03/03/17 1203   02/24/17 1600  nafcillin 2 g in sodium chloride 0.9 % 100 mL IVPB  Status:  Discontinued     2 g 200 mL/hr over 30 Minutes Intravenous Every 4 hours 02/24/17 0833 03/19/17 1411   02/21/17 2000  nafcillin 2 g in dextrose 5 % 100 mL IVPB  Status:  Discontinued     2 g 200 mL/hr over 30 Minutes Intravenous Every 4 hours 02/21/17 1741 02/24/17 0910   02/21/17 1730  nafcillin injection 2 g  Status:  Discontinued     2 g Intravenous Every 4 hours 02/21/17 1726 02/21/17 1740   02/19/17 1230  ceFAZolin (ANCEF) IVPB 2g/100 mL premix  Status:  Discontinued     2 g 200 mL/hr over 30 Minutes Intravenous Every 8 hours 02/19/17 1148 02/21/17 1726   02/18/17 1330  linezolid (ZYVOX) tablet 600 mg  Status:  Discontinued     600 mg Oral Every 12 hours 02/18/17 1250 02/19/17 1126   02/17/17 0800  sulfamethoxazole-trimethoprim (BACTRIM DS,SEPTRA DS) 800-160 MG per tablet 2 tablet  Status:  Discontinued     2 tablet Oral Every 12 hours 02/16/17 1453 02/18/17 1250   02/16/17 1600  sulfamethoxazole-trimethoprim (BACTRIM DS,SEPTRA DS) 800-160 MG per tablet 2 tablet     2 tablet Oral  Once 02/16/17 1509 02/16/17 1657   02/14/17 1400  ceFAZolin (ANCEF) IVPB  2g/100 mL premix  Status:  Discontinued     2 g 200 mL/hr over 30 Minutes Intravenous Every 8 hours 02/14/17 1013 02/16/17 1453   02/14/17 0200  vancomycin (VANCOCIN) IVPB 750 mg/150 ml premix  Status:  Discontinued     750 mg 150 mL/hr over 60 Minutes Intravenous Every 8 hours 02/13/17 1739 02/14/17 1017   02/14/17 0000  piperacillin-tazobactam (ZOSYN) IVPB 3.375 g  Status:  Discontinued     3.375 g 12.5 mL/hr over 240 Minutes Intravenous Every 8 hours 02/13/17 1739 02/14/17 1013   02/13/17 1715  piperacillin-tazobactam (ZOSYN) IVPB 3.375 g     3.375 g 100 mL/hr over 30 Minutes Intravenous  Once 02/13/17 1701 02/13/17 1824   02/13/17 1715  vancomycin (VANCOCIN) IVPB 1000 mg/200 mL premix  Status:  Discontinued     1,000 mg 200 mL/hr over 60 Minutes Intravenous  Once 02/13/17 1701 02/13/17 1705   02/13/17 1715  vancomycin (VANCOCIN) 1,500 mg in sodium chloride 0.9 % 500 mL IVPB     1,500 mg 250 mL/hr over 120 Minutes Intravenous  Once 02/13/17 1705 02/13/17 2300      Subjective:   Awake alert pleasant no distress walking around has many questions about PFO as well as what was found on the heart No  fever eating fairly well and in good spirits otherwise no pain in the neck at the front and no rash or swelling per patient today  Objective:   Vitals:   04/11/17 0850 04/11/17 0855 04/11/17 0900 04/11/17 1322  BP:  107/71  97/65  Pulse: (!) 103 97  91  Resp: 16 17    Temp:    98.1 F (36.7 C)  TempSrc:    Oral  SpO2: 97% 100% 99% 98%  Weight:      Height:        Intake/Output Summary (Last 24 hours) at 04/11/2017 1458 Last data filed at 04/11/2017 1409 Gross per 24 hour  Intake 1070 ml  Output -  Net 1070 ml   Filed Weights   04/09/17 0540 04/10/17 0541 04/11/17 0500  Weight: 76.6 kg (168 lb 12.8 oz) 80.3 kg (177 lb 0.5 oz) 79.4 kg (175 lb 0.7 oz)   Exam  Doing well no distress Neck has less bulky dressing wound to be packed with s1 s2 no m, RRR Multiple  tattoos Neuro intact moves 4 limbs equally sensory grossly intaCT  Data Reviewed: I have personally reviewed following labs and imaging studies  CBC: Recent Labs  Lab 04/05/17 0419 04/07/17 0350 04/10/17 0424  WBC 10.0 12.6* 11.8*  NEUTROABS  --   --  6.6  HGB 9.2* 9.7* 9.1*  HCT 29.1* 30.2* 28.8*  MCV 93.0 91.8 97.0  PLT 272 275 146   Basic Metabolic Panel: Recent Labs  Lab 04/05/17 0419 04/07/17 0350 04/10/17 0424 04/11/17 0451  NA 136 137 141 138  K 3.6 4.4 3.4* 3.9  CL 101 100* 104 102  CO2 _0 GLUCOSE 101* 190* 90 88  BUN <5* _1 CREATININE 0.64 0.64 0.60 0.56  CALCIUM 8.6* 9.4 8.2* 8.5*  MG 1.8  --   --   --    GFR: Estimated Creatinine Clearance: 109.5 mL/min (by C-G formula based on SCr of 0.56 mg/dL). Liver Function Tests: No results for input(s): AST, ALT, ALKPHOS, BILITOT, PROT, ALBUMIN in the last 168 hours. No results for input(s): LIPASE, AMYLASE in the last 168 hours. No results for input(s): AMMONIA in the last 168 hours. Coagulation Profile: Recent Labs  Lab 04/11/17 0451  INR 1.05   Cardiac Enzymes: No results for input(s): CKTOTAL, CKMB, CKMBINDEX, TROPONINI in the last 168 hours. BNP (last 3 results) No results for input(s): PROBNP in the last 8760 hours. HbA1C: No results for input(s): HGBA1C in the last 72 hours. CBG: Recent Labs  Lab 04/09/17 1728  GLUCAP 83   Lipid Profile: No results for input(s): CHOL, HDL, LDLCALC, TRIG, CHOLHDL, LDLDIRECT in the last 72 hours. Thyroid Function Tests: No results for input(s): TSH, T4TOTAL, FREET4, T3FREE, THYROIDAB in the last 72 hours. Anemia Panel: No results for input(s): VITAMINB12, FOLATE, FERRITIN, TIBC, IRON, RETICCTPCT in the last 72 hours. Urine analysis:    Component Value Date/Time   COLORURINE AMBER (A) 02/13/2017 0436   APPEARANCEUR HAZY (A) 02/13/2017 0436   LABSPEC 1.014 02/13/2017 0436   PHURINE 5.0 02/13/2017 0436   GLUCOSEU NEGATIVE 02/13/2017 0436    HGBUR LARGE (A) 02/13/2017 0436   BILIRUBINUR NEGATIVE 02/13/2017 0436   KETONESUR NEGATIVE 02/13/2017 0436   PROTEINUR NEGATIVE 02/13/2017 0436   UROBILINOGEN 0.2 12/07/2012 1510   NITRITE POSITIVE (A) 02/13/2017 0436   LEUKOCYTESUR TRACE (A) 02/13/2017 0436   Sepsis Labs: _2 (procalcitonin:4,lacticidven:4)  ) Recent Results (from the past 240 hour(s))  Surgical  PCR screen     Status: None   Collection Time: 04/09/17 12:48 AM  Result Value Ref Range Status   MRSA, PCR NEGATIVE NEGATIVE Final   Staphylococcus aureus NEGATIVE NEGATIVE Final    Comment: (NOTE) The Xpert SA Assay (FDA approved for NASAL specimens in patients 60 years of age and older), is one component of a comprehensive surveillance program. It is not intended to diagnose infection nor to guide or monitor treatment. Performed at [facility], [place] [address] [place], [place] [address]   Aerobic/Anaerobic Culture (surgical/deep wound)     Status: None (Preliminary result)   Collection Time: 04/09/17  7:54 AM  Result Value Ref Range Status   Specimen Description ABSCESS LEFT NECK  Final   Special Requests PATIENT ON FOLLOWING ANCEF  Final   Gram Stain   Final    RARE WBC PRESENT, PREDOMINANTLY PMN NO ORGANISMS SEEN Performed at [facility], [place] [address], [place], [place] [address]    Culture   Final    RARE SERRATIA MARCESCENS NO ANAEROBES ISOLATED; CULTURE IN PROGRESS FOR 5 DAYS    Report Status PENDING  Incomplete   Organism ID, Bacteria SERRATIA MARCESCENS  Final      Susceptibility   Serratia marcescens - MIC*    CEFAZOLIN >=64 RESISTANT Resistant     CEFEPIME <=1 SENSITIVE Sensitive     CEFTAZIDIME <=1 SENSITIVE Sensitive     CEFTRIAXONE <=1 SENSITIVE Sensitive     CIPROFLOXACIN <=0.25 SENSITIVE Sensitive     GENTAMICIN <=1 SENSITIVE Sensitive     TRIMETH/SULFA <=20 SENSITIVE Sensitive     * RARE SERRATIA MARCESCENS      Radiology Studies: No results found.   Scheduled  Meds: . buprenorphine-naloxone  2 tablet Sublingual Daily  . DULoxetine  60 mg Oral BID  . enoxaparin (LOVENOX) injection  40 mg Subcutaneous Q24H  . ferrous sulfate  325 mg Oral TID WC  . gadobenate dimeglumine  15 mL Intravenous Once  . methocarbamol  500 mg Oral TID  . multivitamin with minerals  1 tablet Oral Daily  . nicotine  7 mg Transdermal Q24H  . OLANZapine  5 mg Oral q morning - 10a  . pantoprazole  40 mg Oral BID  . polyethylene glycol  17 g Oral Daily  . potassium chloride  40 mEq Oral BID  . senna-docusate  1 tablet Oral BID   Continuous Infusions: .  ceFAZolin (ANCEF) IV Stopped (04/11/17 1439)     LOS: 57 days   Time Spent in minutes   15 minutes  Verneita Griffes, MD [facility] [phone]

## 2017-04-11 NOTE — Interval H&P Note (Signed)
History and Physical Interval Note:  04/11/2017 7:54 AM  [name]  has presented today for surgery, with the diagnosis of endocarditis  The various methods of treatment have been discussed with the patient and family. After consideration of risks, benefits and other options for treatment, the patient has consented to  Procedure(s): TRANSESOPHAGEAL ECHOCARDIOGRAM (TEE) (N/A) as a surgical intervention .  The patient's history has been reviewed, patient examined, no change in status, stable for surgery.  I have reviewed the patient's chart and labs.  Questions were answered to the patient's satisfaction.     Ricki RodriguezAjay S Askia Hazelip

## 2017-04-11 NOTE — Consult Note (Addendum)
[name] for Infectious Disease    Date of Admission:  02/13/2017     Total days of antibiotics 58 Day 20 Cefazolin                Reason for Consult: MSSA bacteremia, TV endocarditis, Serratia neck abscess   Referring Provider: Doylene Canard  Primary Care Provider: Jani Gravel, MD   Attending attestation:  I have seen and examined [name] and discussed management options with Tammy Madeira NP.  [name] is at the tail end of a prolonged hospitalization for MSSA bacteremia complicated by tricuspid valve endocarditis and right sternoclavicular septic arthritis.  She recently developed fullness and discomfort in the left side of her neck and was found to have an abscess.  It was drained and abscess cultures are growing Serratia resistant to cefazolin.  I agree with converting to ceftriaxone to cover both MSSA and Serratia.  Although she has persistent tricuspid valve vegetations I do not feel that she needs to extend therapy for endocarditis beyond 04/15/2017.  I suspect that the vegetations are likely sterile following nearly 8 weeks of treatment.  I would give her 1 week of oral trimethoprim sulfamethoxazole after discharge to complete therapy for her Serratia abscess.  Please call if we can be of further assistance.  Michel Bickers, MD [facility] [phone] pager   [phone] cell 04/11/2017, 4:39 PM  Assessment: 32 y.o. 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 Cleared blood cultures on 02/19/17. Recently she developed myositis/cellulitis of the left neck and is now POD 2 from incision and drainage and the drainage is growing serratia that is resistant to cefazolin. Her neck incision is still open as the drain was just removed however surrounding tissue looks good with only slight pinkness. Will change last 4 days of therapy to Ceftriaxone to provide double coverage  for serratia abscess and MSSA endocarditis.  Regarding her residual vegetation this is something I would expect considering the degree of destruction staph aureus can cause and is likely sterile considering she is afebrile and doing well. Would continue current plan for 8 weeks of treatment for MSSA endocarditis through 04/15/17 and follow up with ID and TCTS outpatient for surveillance cultures and follow up for consideration/timing of TV/PFO repair if this is necessary.    Plan: 1. Stop cefazolin  2. Start ceftriaxone 2 gm IV Q24h  3. Prescribe 7 days of trimethoprim sulfamethoxazole 1 double strength tablet twice daily upon discharge 4. Follow up ID appointment with Terri Piedra, NP on 05/06/17 @ 10:00  Available as needed for questions. Thank you.   Principal Problem:   MSSA bacteremia Active Problems:   Staphylococcus aureus bacteremia with sepsis ([place])   Septic arthritis of right sternoclavicular joint ([place])   Endocarditis of tricuspid valve   Infectious myositis L neck   Pancytopenia (HCC)   Sepsis affecting skin (HCC)   Cellulitis of left hand   Weakness of both lower extremities   Pain of right clavicle   IVDU (intravenous drug user)   PFO (patent foramen ovale)   Chronic diastolic CHF (congestive heart failure) (HCC)   Blood per rectum   Hematuria   . buprenorphine-naloxone  2 tablet Sublingual Daily  . DULoxetine  60 mg Oral BID  . enoxaparin (LOVENOX) injection  40 mg Subcutaneous Q24H  . ferrous sulfate  325 mg Oral TID WC  . gadobenate  dimeglumine  15 mL Intravenous Once  . methocarbamol  500 mg Oral TID  . multivitamin with minerals  1 tablet Oral Daily  . nicotine  7 mg Transdermal Q24H  . OLANZapine  5 mg Oral q morning - 10a  . pantoprazole  40 mg Oral BID  . polyethylene glycol  17 g Oral Daily  . potassium chloride  40 mEq Oral BID  . senna-docusate  1 tablet Oral BID    HPI: [name] is a 32 y.o. female admitted with sepsis, MSSA bacteremia,  tricuspid valve endocarditis per TEE, Mentioned in previous notes that she had pain in her right clavicle back in February when she was seen by Dr. Tommy Medal and improved on oral Bactrim while we were awaiting IV access to continue therapy for bacteremia (received 2/3 - 2/5). CT of the clavicle was obtained on   She developed pancytopenia with fevers on Nafcillin and was switched to Ancef 03/22/2017. Bone marrow biopsy done 3/11. Resolution with WBC 7 by 3/13.   Op Note 3/27 > Fibrinoud debris and purulence immediately obtained after incision was made and blunt dissection carried down by the platsyma. Irrigation performed and penrose drain placed into abscess cavity. Abscess extending from subcutaneous tissue extending down into the sternocleidomastoid muscle. Cultures growing serration (R-ancef).   Repeat TEE done today to follow structural abnormalities - TV with perforation, regurgitation and still with residual mobile vegetation (no formal report back yet regarding size). TEE from 2/22 1.8 x 1.7 cm vegetation.  She is feeling much better and hopeful that she can go home as expected. Her neck is improved and she has no pain and reports only a little drainage now. Swallowing and breathing without compromise.    Past Medical History:  Diagnosis Date  . Anemia, unspecified   . Anxiety   . Asthma   . Disc herniation    causes sciatica unsure which disc  . Esophageal reflux   . Fatty liver   . Fibromyalgia   . Gastroparesis   . Headache(784.0)   . Heart murmur   . History of narcotic addiction ([place]) 09/30/2012   2010:  Per pt, was addicted to narcotics; mom helped intervene and stopped taking opiods   . Hyperemesis gravidarum with metabolic disturbance, unspecified as to episode of care   . Irritable bowel syndrome   . MSSA bacteremia 02/20/2017  . Other and unspecified noninfectious gastroenteritis and colitis(558.9)   . Pregnant   . PTSD (post-traumatic stress disorder)   . Septic arthritis  of right sternoclavicular joint ([place]) 02/20/2017  . Unspecified asthma(493.90)     Social History   Tobacco Use  . Smoking status: Current Every Day Smoker    Packs/day: 2.00    Years: 10.00    Pack years: 20.00    Types: Cigarettes  . Smokeless tobacco: Never Used  Substance Use Topics  . Alcohol use: No  . Drug use: No    Types: IV    Comment: pt denies 11/30/15    Family History  Problem Relation Age of Onset  . Ulcerative colitis Paternal Grandmother   . Cancer Paternal Grandmother   . Colon polyps Paternal Grandfather   . Cancer Paternal Grandfather        thyroid  . Diabetes Father   . Heart disease Father   . Kidney disease Father   . Hypertension Father    Allergies  Allergen Reactions  . Azithromycin Nausea And Vomiting    ABDOMINAL PAIN  .  Nsaids     UNSPECIFIED REACTION    . Hydrocodone Nausea And Vomiting  . Penicillins Nausea And Vomiting     Has patient had a PCN reaction causing immediate rash, facial/tongue/throat swelling, SOB or lightheadedness with hypotension: No Has patient had a PCN reaction causing severe rash involving mucus membranes or skin necrosis: No Has patient had a PCN reaction that required hospitalization No Has patient had a PCN reaction occurring within the last 10 years: No If all of the above answers are "NO", then may proceed with Cephalosporin use.   . Prednisone Nausea Only and Other (See Comments)    Can take shot, but pill form "caused stomach pain , nausea"    OBJECTIVE: Blood pressure 97/65, pulse 91, temperature 98.1 F (36.7 C), temperature source Oral, resp. rate 17, height '5\' 7"'  (1.702 m), weight 175 lb 0.7 oz (79.4 kg), last menstrual period 03/23/2017, SpO2 98 %.  Physical Exam  Constitutional: She is oriented to person, place, and time and well-developed, well-nourished, and in no distress.  Seated in the chair eating cookies. Appears well and in good mood.   HENT:  Mouth/Throat: No oral lesions. No dental  abscesses.  Neck:  Left neck with open 1-2cm incision with slight serosanguinous drainage. Slight pinkness to surrounding incision. Minimal swelling.   Cardiovascular: Normal rate and regular rhythm. Exam reveals no gallop.  Murmur (0-9/9 systolic murmur LUSB ) heard. Pulmonary/Chest: Effort normal and breath sounds normal.  Abdominal: Soft. She exhibits no distension. There is no tenderness.  Musculoskeletal: Normal range of motion. She exhibits no tenderness.  Lymphadenopathy:    She has no cervical adenopathy.  Neurological: She is alert and oriented to person, place, and time.  Skin: Skin is warm and dry. No rash noted.  Psychiatric: Mood, affect and judgment normal.  Nursing note and vitals reviewed.   Lab Results Lab Results  Component Value Date   WBC 11.8 (H) 04/10/2017   HGB 9.1 (L) 04/10/2017   HCT 28.8 (L) 04/10/2017   MCV 97.0 04/10/2017   PLT 286 04/10/2017    Lab Results  Component Value Date   CREATININE 0.56 04/11/2017   BUN 8 04/11/2017   NA 138 04/11/2017   K 3.9 04/11/2017   CL 102 04/11/2017   CO2 28 04/11/2017    Lab Results  Component Value Date   ALT 9 (L) 03/22/2017   AST 11 (L) 03/22/2017   ALKPHOS 69 03/22/2017   BILITOT 2.0 (H) 03/22/2017     Microbiology: Recent Results (from the past 240 hour(s))  Surgical PCR screen     Status: None   Collection Time: 04/09/17 12:48 AM  Result Value Ref Range Status   MRSA, PCR NEGATIVE NEGATIVE Final   Staphylococcus aureus NEGATIVE NEGATIVE Final    Comment: (NOTE) The Xpert SA Assay (FDA approved for NASAL specimens in patients 55 years of age and older), is one component of a comprehensive surveillance program. It is not intended to diagnose infection nor to guide or monitor treatment. Performed at [facility], [place] [employer] [address], [place], [place] [address]   Aerobic/Anaerobic Culture (surgical/deep wound)     Status: None (Preliminary result)   Collection Time: 04/09/17  7:54 AM  Result  Value Ref Range Status   Specimen Description ABSCESS LEFT NECK  Final   Special Requests PATIENT ON FOLLOWING ANCEF  Final   Gram Stain   Final    RARE WBC PRESENT, PREDOMINANTLY PMN NO ORGANISMS SEEN Performed at [facility], [place] [address] [place], [place] [address]    Culture   Final    RARE SERRATIA MARCESCENS NO ANAEROBES ISOLATED; CULTURE IN PROGRESS FOR 5 DAYS    Report Status PENDING  Incomplete   Organism ID, Bacteria SERRATIA MARCESCENS  Final      Susceptibility   Serratia marcescens - MIC*    CEFAZOLIN >=64 RESISTANT Resistant     CEFEPIME <=1 SENSITIVE Sensitive     CEFTAZIDIME <=1 SENSITIVE Sensitive     CEFTRIAXONE <=1 SENSITIVE Sensitive     CIPROFLOXACIN <=0.25 SENSITIVE Sensitive     GENTAMICIN <=1 SENSITIVE Sensitive     TRIMETH/SULFA <=20 SENSITIVE Sensitive     * RARE SERRATIA MARCESCENS    Tammy Madeira, MSN, NP-C [place] [facility] Cell: [phone] Pager: [phone]  04/11/2017 1:47 PM

## 2017-04-11 NOTE — Transfer of Care (Signed)
Immediate Anesthesia Transfer of Care Note  Patient: [name]  Procedure(s) Performed: TRANSESOPHAGEAL ECHOCARDIOGRAM (TEE) (N/A )  Patient Location: Endoscopy Unit  Anesthesia Type:MAC  Level of Consciousness: awake, alert  and oriented  Airway & Oxygen Therapy: Patient Spontanous Breathing and Patient connected to nasal cannula oxygen  Post-op Assessment: Report given to RN and Post -op Vital signs reviewed and stable  Post vital signs: Reviewed and stable  Last Vitals:  Vitals Value Taken Time  BP    Temp    Pulse 99 04/11/2017  8:44 AM  Resp 19 04/11/2017  8:44 AM  SpO2 96 % 04/11/2017  8:44 AM  Vitals shown include unvalidated device data.  Last Pain:  Vitals:   04/11/17 0732  TempSrc: Oral  PainSc: 0-No pain      Patients Stated Pain Goal: 3 (37/34/28 7681)  Complications: No apparent anesthesia complications

## 2017-04-12 NOTE — Progress Notes (Signed)
Patient seen and examined walking the halls without any deficit no chest pain no fever no chills neck pain is better Has questions about where she will obtained Suboxone and understands that I cannot prescribe this without specialized training for the pain We will do labs periodically on her but I expect we will be able to discharge her for/2 and I will see her more comprehensively then

## 2017-04-13 ENCOUNTER — Encounter (HOSPITAL_COMMUNITY): Payer: Self-pay | Admitting: Cardiovascular Disease

## 2017-04-13 NOTE — Progress Notes (Signed)
PROGRESS NOTE    [name]  TGY:[id number] DOB: [birth year] DOA: 02/13/2017 PCP: Jani Gravel, MD   Brief Narrative:  32 year old female with history of IV drug abuse admitted on 02/13/2017 and found to have MSSA bacteremia secondary to tricuspid valve endocarditis and involvement of PFO.   Patient placed on IV antibiotics and will need to continue IV antibiotics through 04/15/2017.   Patient developed pancytopenia about 2 weeks ago, hematology consulted, thought to be medication related.  Pancytopenia has resolved.  Recent left neck swelling was noted.  Initial CT scan did not reveal an abscess.  Patient was initially started on steroids.  Next swelling has improved, and nontender.  ENT advise repeat CT of the soft tissue of the neck with contrast today, 04/08/2017.  Repeat CT soft tissue of the neck has revealed abscess.  Patient has been kept n.p.o. overnight.  ENT plans to proceed with I&D on 04/09/2017.  Otherwise, no new complaints today, 04/08/2017.  No fever or chills, no shortness of breath, no chest pain.    She has TEE echocardiogram scheduled for Friday, 04/11/2017  AdmittedAssessment & Plan   MSSA bacteremia and tricuspid valve endocarditis -Initially placed on IV nafcillin which was discontinued due to suspected bone marrow suppression -Infectious disease consulted and appreciated, recommended antibiotics through 04/15/2017 -currently on Ancef -Cardiology Dr. Doylene Canard repeat TEE 04/11/17--shows mobile vegetation tricuspid valve severe TR with perforation mild MR and positive for PFO---will need outpatient management with CVTS going forward for consideration of the same -Patient has PICC line (placed on 03/21/2017) which is to be pulled on completion of antibiotics 04/15/17  Anemia  -patient had bloody/dark stools during this admission -Suspected anemia multifactorial including possible blood loss versus severe systemic infection versus bone marrow suppression versus  antibiotics -Hemoglobin stable and still stable in the 9 range--gastroenterology saw and signed off.  Did not feel upper or lower endoscopy was not warranted given that there was no overt bleeding.  Recommended continuing PPI twice daily.  Left neck swelling, positive for abscess-- I&D abscess 3/26 Wound culture growing Serratia resistant to cefazolin -ENT, Dr. Blenda Nicely, consulted and appreciated.  -s/p I& D 04/09/2017--post op management wound per Dr. Marlowe Alt dressings 3 times a day --started on Bactrim DS X 5 days ending 4/2  --Neck wound to be packed with 1/4  inch strip gauze twice daily until no longer fitting -Wound overall looks improved without issue  Chronic pain management/history of IV drug abuse/ anxiety and depression -Continue Cymbalta, oxycodone, Zyprexa, Tylenol as needed -Currently on Suboxone-do not escalate pain medications or doses   Pancytopenia -Resolved, Likely due to bone marrow suppression in the setting of sepsis/endocarditis -Hematology consulted and appreciated, status post bone marrow biopsy-suggested changes secondary to medication -Continue to monitor CBC (appears to be improving)  Hypokalemia -resolved--- repeat periodically  Asymptomatic bradycardia  -resolved  Left lower extremity edema -Lower extremity Doppler was negative for DVT -Encourage ambulation and elevating leg -Continue to monitor  DVT Prophylaxis  Lovenox  Code Status: Full   Family Communication: e no family is currently at bedside today  Disposition Plan: Admitted. Will continue antibiotics through 4/2.  Arranging for outpatient appointment through secretary for CVTS follow-up Overall has improved and will be discharged probably 4/2 without issue  Consultants ENT Gastroenterology Hematology Cardiology  Infectious disease Interventional radiology (bone marrow biopsy)  Procedures  Lower extremity doppler TEE  repeat TEE--3/29 findings as above  Antibiotics      Subjective:   Awake alert  no distress no other  issues No fever no chills No abdominal pain   Objective:   Vitals:   04/12/17 1631 04/12/17 2031 04/13/17 0500 04/13/17 0526  BP: 120/83 97/68  98/76  Pulse: (!) 101 93  79  Resp: _0 Temp: 97.9 F (36.6 C) 98.2 F (36.8 C)  (!) 97.4 F (36.3 C)  TempSrc: Oral Oral  Oral  SpO2: 97% 96%  96%  Weight:   79.5 kg (175 lb 4.3 oz)   Height:        Intake/Output Summary (Last 24 hours) at 04/13/2017 1050 Last data filed at 04/12/2017 2100 Gross per 24 hour  Intake 480 ml  Output 1 ml  Net 479 ml   Filed Weights   04/11/17 0500 04/12/17 0633 04/13/17 0500  Weight: 79.4 kg (175 lb 0.7 oz) 79.5 kg (175 lb 4.3 oz) 79.5 kg (175 lb 4.3 oz)   Exam  Doing well no distress Dressing to neck looks good with mild irritation but no other issue s1 s2 no m, RRR Multiple tattoos Neuro intact moves 4 limbs equally sensory grossly intacT  Data Reviewed: I have personally reviewed following labs and imaging studies  CBC: Recent Labs  Lab 04/07/17 0350 04/10/17 0424  WBC 12.6* 11.8*  NEUTROABS  --  6.6  HGB 9.7* 9.1*  HCT 30.2* 28.8*  MCV 91.8 97.0  PLT 275 503   Basic Metabolic Panel: Recent Labs  Lab 04/07/17 0350 04/10/17 0424 04/11/17 0451  NA 137 141 138  K 4.4 3.4* 3.9  CL 100* 104 102  CO2 _1 GLUCOSE 190* 90 88  BUN _2 CREATININE 0.64 0.60 0.56  CALCIUM 9.4 8.2* 8.5*   GFR: Estimated Creatinine Clearance: 109.7 mL/min (by C-G formula based on SCr of 0.56 mg/dL). Liver Function Tests: No results for input(s): AST, ALT, ALKPHOS, BILITOT, PROT, ALBUMIN in the last 168 hours. No results for input(s): LIPASE, AMYLASE in the last 168 hours. No results for input(s): AMMONIA in the last 168 hours. Coagulation Profile: Recent Labs  Lab 04/11/17 0451  INR 1.05   Cardiac Enzymes: No results for input(s): CKTOTAL, CKMB, CKMBINDEX, TROPONINI in the last 168 hours. BNP (last 3 results) No  results for input(s): PROBNP in the last 8760 hours. HbA1C: No results for input(s): HGBA1C in the last 72 hours. CBG: Recent Labs  Lab 04/09/17 1728  GLUCAP 83   Lipid Profile: No results for input(s): CHOL, HDL, LDLCALC, TRIG, CHOLHDL, LDLDIRECT in the last 72 hours. Thyroid Function Tests: No results for input(s): TSH, T4TOTAL, FREET4, T3FREE, THYROIDAB in the last 72 hours. Anemia Panel: No results for input(s): VITAMINB12, FOLATE, FERRITIN, TIBC, IRON, RETICCTPCT in the last 72 hours. Urine analysis:    Component Value Date/Time   COLORURINE AMBER (A) 02/13/2017 0436   APPEARANCEUR HAZY (A) 02/13/2017 0436   LABSPEC 1.014 02/13/2017 0436   PHURINE 5.0 02/13/2017 0436   GLUCOSEU NEGATIVE 02/13/2017 0436   HGBUR LARGE (A) 02/13/2017 0436   BILIRUBINUR NEGATIVE 02/13/2017 0436   KETONESUR NEGATIVE 02/13/2017 0436   PROTEINUR NEGATIVE 02/13/2017 0436   UROBILINOGEN 0.2 12/07/2012 1510   NITRITE POSITIVE (A) 02/13/2017 0436   LEUKOCYTESUR TRACE (A) 02/13/2017 0436   Sepsis Labs: _3 (procalcitonin:4,lacticidven:4)  ) Recent Results (from the past 240 hour(s))  Surgical PCR screen     Status: None   Collection Time: 04/09/17 12:48 AM  Result Value Ref Range Status   MRSA, PCR NEGATIVE NEGATIVE Final   Staphylococcus aureus NEGATIVE NEGATIVE  Final    Comment: (NOTE) The Xpert SA Assay (FDA approved for NASAL specimens in patients 46 years of age and older), is one component of a comprehensive surveillance program. It is not intended to diagnose infection nor to guide or monitor treatment. Performed at [facility], [place] [address], [place], [place] [address]   Aerobic/Anaerobic Culture (surgical/deep wound)     Status: None (Preliminary result)   Collection Time: 04/09/17  7:54 AM  Result Value Ref Range Status   Specimen Description ABSCESS LEFT NECK  Final   Special Requests PATIENT ON FOLLOWING ANCEF  Final   Gram Stain   Final    RARE WBC PRESENT,  PREDOMINANTLY PMN NO ORGANISMS SEEN Performed at [facility], [place] [address], [place], [place] [address]    Culture   Final    RARE SERRATIA MARCESCENS NO ANAEROBES ISOLATED; CULTURE IN PROGRESS FOR 5 DAYS    Report Status PENDING  Incomplete   Organism ID, Bacteria SERRATIA MARCESCENS  Final      Susceptibility   Serratia marcescens - MIC*    CEFAZOLIN >=64 RESISTANT Resistant     CEFEPIME <=1 SENSITIVE Sensitive     CEFTAZIDIME <=1 SENSITIVE Sensitive     CEFTRIAXONE <=1 SENSITIVE Sensitive     CIPROFLOXACIN <=0.25 SENSITIVE Sensitive     GENTAMICIN <=1 SENSITIVE Sensitive     TRIMETH/SULFA <=20 SENSITIVE Sensitive     * RARE SERRATIA MARCESCENS      Radiology Studies: No results found.   Scheduled Meds: . buprenorphine-naloxone  2 tablet Sublingual Daily  . DULoxetine  60 mg Oral BID  . enoxaparin (LOVENOX) injection  40 mg Subcutaneous Q24H  . ferrous sulfate  325 mg Oral TID WC  . gadobenate dimeglumine  15 mL Intravenous Once  . methocarbamol  500 mg Oral TID  . multivitamin with minerals  1 tablet Oral Daily  . nicotine  7 mg Transdermal Q24H  . OLANZapine  5 mg Oral q morning - 10a  . pantoprazole  40 mg Oral BID  . polyethylene glycol  17 g Oral Daily  . potassium chloride  40 mEq Oral BID  . senna-docusate  1 tablet Oral BID   Continuous Infusions: . cefTRIAXone (ROCEPHIN)  IV Stopped (04/12/17 2323)     LOS: 59 days   Time Spent in minutes   15 minutes  Verneita Griffes, MD [facility] [phone]

## 2017-04-14 LAB — CBC WITH DIFFERENTIAL/PLATELET
Basophils Absolute: 0 10*3/uL (ref 0.0–0.1)
Basophils Relative: 0 %
Eosinophils Absolute: 0.4 10*3/uL (ref 0.0–0.7)
Eosinophils Relative: 4 %
HEMATOCRIT: 32.8 % — AB (ref 36.0–46.0)
HEMOGLOBIN: 10.2 g/dL — AB (ref 12.0–15.0)
LYMPHS ABS: 3.6 10*3/uL (ref 0.7–4.0)
Lymphocytes Relative: 39 %
MCH: 29.3 pg (ref 26.0–34.0)
MCHC: 31.1 g/dL (ref 30.0–36.0)
MCV: 94.3 fL (ref 78.0–100.0)
Monocytes Absolute: 0.6 10*3/uL (ref 0.1–1.0)
Monocytes Relative: 7 %
NEUTROS ABS: 4.7 10*3/uL (ref 1.7–7.7)
NEUTROS PCT: 50 %
Platelets: 293 10*3/uL (ref 150–400)
RBC: 3.48 MIL/uL — AB (ref 3.87–5.11)
RDW: 18.1 % — ABNORMAL HIGH (ref 11.5–15.5)
WBC: 9.3 10*3/uL (ref 4.0–10.5)

## 2017-04-14 LAB — BASIC METABOLIC PANEL
Anion gap: 8 (ref 5–15)
BUN: 11 mg/dL (ref 6–20)
CHLORIDE: 101 mmol/L (ref 101–111)
CO2: 30 mmol/L (ref 22–32)
Calcium: 9.4 mg/dL (ref 8.9–10.3)
Creatinine, Ser: 0.66 mg/dL (ref 0.44–1.00)
GFR calc non Af Amer: >60 mL/min (ref >60)
Glucose, Bld: 98 mg/dL (ref 65–99)
POTASSIUM: 4.3 mmol/L (ref 3.5–5.1)
SODIUM: 139 mmol/L (ref 135–145)

## 2017-04-14 LAB — AEROBIC/ANAEROBIC CULTURE W GRAM STAIN (SURGICAL/DEEP WOUND)

## 2017-04-14 MED ORDER — SULFAMETHOXAZOLE-TRIMETHOPRIM 800-160 MG PO TABS: 1.0000 | ORAL_TABLET | Freq: Two times a day (BID) | ORAL | 0 refills | Status: DC

## 2017-04-14 NOTE — Progress Notes (Deleted)
Patient ambulated the hallways twice in one setting. Patient O2 sats maintained in the lower 90's during 5 minute brisk walk.

## 2017-04-14 NOTE — Progress Notes (Signed)
Physical Therapy Treatment Patient Details Name: [name] MRN: [id number] DOB: August 21, 1985 Today's Date: 04/14/2017    History of Present Illness Pt is a 32 y.o. female, with past medical history significant for fibromyalgia, anemia and history of IV drug abuse. She presented to the ED with a few days history of fever, chills, and generalized weakness muscle aches in addition to arthralgias.  She was admitted with diagnosis of sepsis.  Pt being treated for endocarditis. 3/11 developed L neck cellulitis.     PT Comments    Pt able to teach back HEP without looking at sheet, with omitting only one exercise. HEP continues to be challenging for pt only one additional balance activity added. Pt able to increase gait velocity for short bursts during ambulation without SoB or LoB. Pt encouraged to keep up walking once she is at home. Pt with mod I ascent/descent of flight of stairs with supervision. Pt scheduled for discharge this week. If pt is still here next week will reevaluate HEP.     Follow Up Recommendations  No PT follow up     Equipment Recommendations  None recommended by PT    Recommendations for Other Services       Precautions / Restrictions Precautions Precautions: Fall Restrictions Weight Bearing Restrictions: No    Mobility  Bed Mobility Overal bed mobility: Modified Independent                Transfers Overall transfer level: Modified independent                  Ambulation/Gait Ambulation/Gait assistance: Independent Ambulation Distance (Feet): 1000 Feet Assistive device: None Gait Pattern/deviations: Step-through pattern;Decreased stride length;Drifts right/left Gait velocity: WFL Gait velocity interpretation: >2.62 ft/sec, indicative of independent community ambulator     Stairs     Stair Management: Alternating pattern;Forwards;One rail Right Number of Stairs: 10 General stair comments: slow, steady ascent and descent   Wheelchair  Mobility    Modified Rankin (Stroke Patients Only)       Balance Overall balance assessment: Modified Independent               Single Leg Stance - Right Leg: 20 Single Leg Stance - Left Leg: 20         High level balance activites: Side stepping;Backward walking(heel toe walking) High Level Balance Comments: able to perform HEP without looking at sheet, with omition of only backwards walking             Cognition Arousal/Alertness: Awake/alert Behavior During Therapy: WFL for tasks assessed/performed Overall Cognitive Status: Within Functional Limits for tasks assessed                                        Exercises Other Exercises Other Exercises: sit<>stand no UE support x 5        Pertinent Vitals/Pain Pain Assessment: No/denies pain Faces Pain Scale: No hurt           PT Goals (current goals can now be found in the care plan section) Acute Rehab PT Goals PT Goal Formulation: With patient Time For Goal Achievement: 04/18/17 Potential to Achieve Goals: Good Progress towards PT goals: Progressing toward goals    Frequency    Min 1X/week      PT Plan Current plan remains appropriate       AM-PAC PT "6 Clicks" Daily Activity  Outcome  Measure  Difficulty turning over in bed (including adjusting bedclothes, sheets and blankets)?: None Difficulty moving from lying on back to sitting on the side of the bed? : None Difficulty sitting down on and standing up from a chair with arms (e.g., wheelchair, bedside commode, etc,.)?: None Help needed moving to and from a bed to chair (including a wheelchair)?: None Help needed walking in hospital room?: None Help needed climbing 3-5 steps with a railing? : None 6 Click Score: 24    End of Session   Activity Tolerance: Patient tolerated treatment well Patient left: Other (comment)(in room able to access needs)   PT Visit Diagnosis: Muscle weakness (generalized) (M62.81);Difficulty  in walking, not elsewhere classified (R26.2)     Time: [id number] PT Time Calculation (min) (ACUTE ONLY): 16 min  Charges:  $Therapeutic Exercise: 8-22 mins                    G Codes:       King Pinzon B. Beverely RisenVan Fleet PT, DPT Acute Rehabilitation  [phone] Pager [phone]     Elon Alaslizabeth B Van Fleet 04/14/2017, 4:50 PM

## 2017-04-14 NOTE — Progress Notes (Addendum)
Darl PikesSusan from Dr. Tyrone SageGerhardt stated she will call patient with discharge information for follow up appointment.  Darl PikesSusan is waiting approval from Dr. Tyrone SageGerhardt prior to making patient follow up appointment.[phone]

## 2017-04-14 NOTE — Progress Notes (Signed)
Nutrition Follow-up  DOCUMENTATION CODES:   Not applicable  INTERVENTION:  Monitor for needs  NUTRITION DIAGNOSIS:   Inadequate oral intake related to lethargy/confusion, poor appetite as evidenced by meal completion < 50%. -resolved  GOAL:   Patient will meet greater than or equal to 90% of their needs -met  MONITOR:   PO intake, Supplement acceptance, Labs, Weight trends, Skin, I & O's  ASSESSMENT:   32 y.o. female w/ a hx of fibromyalgia, anemia, and IV drug abuse who presented with a few days history of fever, chills, generalized weakness muscle aches, and arthralgias.  In the emergency room the patient was noted to have fever, leukocytosis and left hand and bilateral lower extremity redness and petechiae.    Underwent TEE 3/29 -Found mobile vegetations on tricuspid valve, MR and TR Continues to eat 100% and receiving food from outside Will likely be d/c tomorrow per MD following completion antibiotics and removal of PICC Line No current needs  Labs reviewed Medications reviewed and include:  Iron, MVI w/ Minerals, Senokot-S, Miralax  Diet Order:  Diet regular Room service appropriate? Yes; Fluid consistency: Thin  EDUCATION NEEDS:   Education needs have been addressed  Skin:  Skin Assessment: Reviewed RN Assessment  Last BM:  04/13/2017  Height:   Ht Readings from Last 1 Encounters:  03/11/17 '5\' 7"'  (1.702 m)    Weight:   Wt Readings from Last 1 Encounters:  04/14/17 173 lb 8 oz (78.7 kg)    Ideal Body Weight:  61.4 kg  BMI:  Body mass index is 27.17 kg/m.  Estimated Nutritional Needs:   Kcal:  1700-1900  Protein:  90-105 grams  Fluid:  1.7-1.9 L  Satira Anis. Kady Toothaker, MS, RD LDN Inpatient Clinical Dietitian Pager [phone]

## 2017-04-14 NOTE — Progress Notes (Signed)
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   Patient placed on IV antibiotics and will need to continue IV antibiotics through 04/15/2017.   Patient developed pancytopenia about 2 weeks ago, hematology consulted, thought to be medication related.  Pancytopenia has resolved.  Recent left neck swelling was noted.  Initial CT scan did not reveal an abscess.  Patient was initially started on steroids.  Next swelling has improved, and nontender.  ENT advise repeat CT of the soft tissue of the neck with contrast today, 04/08/2017.  Repeat CT soft tissue of the neck has revealed abscess.  Patient has been kept n.p.o. overnight.  ENT plans to proceed with I&D on 04/09/2017.  Otherwise, no new complaints today, 04/08/2017.  No fever or chills, no shortness of breath, no chest pain.    She has TEE echocardiogram scheduled for Friday, 04/11/2017  AdmittedAssessment & Plan   MSSA bacteremia and tricuspid valve endocarditis -Initially placed on IV nafcillin which was discontinued due to suspected bone marrow suppression -Infectious disease consulted and appreciated, recommended antibiotics through 04/15/2017 -currently on Ancef -Cardiology Dr. Doylene Canard repeat TEE 04/11/17--shows mobile vegetation tricuspid valve severe TR with perforation mild MR and positive for PFO --will need outpatient management with CVTS going forward for consideration of the same--RN/Secretary asked to kindly arrange OP visit and put on AVS -Patient has PICC line (placed on 03/21/2017) which is to be pulled on completion of antibiotics 04/15/17  Anemia  -patient had bloody/dark stools during this admission -Suspected anemia multifactorial including possible blood loss versus severe systemic  infection versus bone marrow suppression versus antibiotics -Hemoglobin stable and still stable in the 9 range--gastroenterology saw and signed off.  Did not feel upper or lower endoscopy was not warranted given that there was no overt bleeding.  Recommended continuing PPI twice daily.  Left neck swelling, positive for abscess-- I&D abscess 3/26 Wound culture growing Serratia resistant to cefazolin -ENT, Dr. Blenda Nicely, consulted and appreciated.  -s/p I& D 04/09/2017--post op management wound per Dr. Marlowe Alt dressings 3 times a day --started on Bactrim DS X 5 days ending 4/2  --Neck wound to be packed with 1/4 inch strip gauze twice daily until no longer fitting -Wound overall looks improved without issue  Chronic pain management/history of IV drug abuse/ anxiety and depression -Continue Cymbalta, oxycodone, Zyprexa, Tylenol as needed -Currently on Suboxone-do not escalate pain medications or doses   Pancytopenia -Resolved, Likely due to bone marrow suppression in the setting of sepsis/endocarditis -Hematology consulted and appreciated, status post bone marrow biopsy-suggested changes secondary to medication -Continue to monitor CBC (appears to be improving)  Hypokalemia -resolved--- repeat periodically  Asymptomatic bradycardia  -resolved  Left lower extremity edema -Lower extremity Doppler was negative for DVT -Encourage ambulation   DVT Prophylaxis  Lovenox  Code Status: Full   Family Communication: e no family is currently at bedside today  Disposition Plan: Admitted. Will continue antibiotics through 4/2.  will be discharged probably 4/2 without issue  Consultants ENT Gastroenterology Hematology Cardiology  Infectious disease Interventional radiology (bone marrow biopsy)  Procedures  Lower extremity doppler TEE  repeat TEE--3/29 findings as above  Antibiotics     Subjective:   Awake alert little bit sleepy but no other issues moving around No  chest pain No  nausea vomiting  Objective:   Vitals:   04/13/17 0526 04/13/17 1411 04/13/17 2111 04/14/17 0452  BP: 98/76 (!) 83/55 108/71 108/71  Pulse: 79 88 (!) 105 (!) 105  Resp: _0 Temp: (!) 97.4 F (36.3 C) 97.8 F (36.6 C) 97.6 F (36.4 C) 97.8 F (36.6 C)  TempSrc: Oral Oral Oral Oral  SpO2: 96% 97% 100% 99%  Weight:    78.7 kg (173 lb 8 oz)  Height:        Intake/Output Summary (Last 24 hours) at 04/14/2017 1525 Last data filed at 04/14/2017 1400 Gross per 24 hour  Intake 450 ml  Output -  Net 450 ml   Filed Weights   04/12/17 0633 04/13/17 0500 04/14/17 0452  Weight: 79.5 kg (175 lb 4.3 oz) 79.5 kg (175 lb 4.3 oz) 78.7 kg (173 lb 8 oz)   Exam  Doing well no distress no issues Dressing to neck looks good with mild irritation but no other issue s1 s2 no m, RRR Multiple tattoos Neuro intact moves 4 limbs equally sensory grossly intacT  Data Reviewed: I have personally reviewed following labs and imaging studies  CBC: Recent Labs  Lab 04/10/17 0424 04/14/17 0441  WBC 11.8* 9.3  NEUTROABS 6.6 4.7  HGB 9.1* 10.2*  HCT 28.8* 32.8*  MCV 97.0 94.3  PLT 286 876   Basic Metabolic Panel: Recent Labs  Lab 04/10/17 0424 04/11/17 0451 04/14/17 0441  NA 141 138 139  K 3.4* 3.9 4.3  CL 104 102 101  CO2 _1 GLUCOSE 90 88 98  BUN _2 CREATININE 0.60 0.56 0.66  CALCIUM 8.2* 8.5* 9.4   GFR: Estimated Creatinine Clearance: 109 mL/min (by C-G formula based on SCr of 0.66 mg/dL). Liver Function Tests: No results for input(s): AST, ALT, ALKPHOS, BILITOT, PROT, ALBUMIN in the last 168 hours. No results for input(s): LIPASE, AMYLASE in the last 168 hours. No results for input(s): AMMONIA in the last 168 hours. Coagulation Profile: Recent Labs  Lab 04/11/17 0451  INR 1.05   Cardiac Enzymes: No results for input(s): CKTOTAL, CKMB, CKMBINDEX, TROPONINI in the last 168 hours. BNP (last 3 results) No results for input(s): PROBNP in the  last 8760 hours. HbA1C: No results for input(s): HGBA1C in the last 72 hours. CBG: Recent Labs  Lab 04/09/17 1728  GLUCAP 83   Lipid Profile: No results for input(s): CHOL, HDL, LDLCALC, TRIG, CHOLHDL, LDLDIRECT in the last 72 hours. Thyroid Function Tests: No results for input(s): TSH, T4TOTAL, FREET4, T3FREE, THYROIDAB in the last 72 hours. Anemia Panel: No results for input(s): VITAMINB12, FOLATE, FERRITIN, TIBC, IRON, RETICCTPCT in the last 72 hours. Urine analysis:    Component Value Date/Time   COLORURINE AMBER (A) 02/13/2017 0436   APPEARANCEUR HAZY (A) 02/13/2017 0436   LABSPEC 1.014 02/13/2017 0436   PHURINE 5.0 02/13/2017 0436   GLUCOSEU NEGATIVE 02/13/2017 0436   HGBUR LARGE (A) 02/13/2017 0436   BILIRUBINUR NEGATIVE 02/13/2017 0436   KETONESUR NEGATIVE 02/13/2017 0436   PROTEINUR NEGATIVE 02/13/2017 0436   UROBILINOGEN 0.2 12/07/2012 1510   NITRITE POSITIVE (A) 02/13/2017 0436   LEUKOCYTESUR TRACE (A) 02/13/2017 0436   Sepsis Labs: _3 (procalcitonin:4,lacticidven:4)  ) Recent Results (from the past 240 hour(s))  Surgical PCR screen     Status: None   Collection Time: 04/09/17 12:48 AM  Result Value Ref Range Status   MRSA, PCR NEGATIVE NEGATIVE Final   Staphylococcus aureus NEGATIVE NEGATIVE Final  Comment: (NOTE) The Xpert SA Assay (FDA approved for NASAL specimens in patients 88 years of age and older), is one component of a comprehensive surveillance program. It is not intended to diagnose infection nor to guide or monitor treatment. Performed at [facility], [place] [address] [place], [place] [address]   Aerobic/Anaerobic Culture (surgical/deep wound)     Status: None   Collection Time: 04/09/17  7:54 AM  Result Value Ref Range Status   Specimen Description ABSCESS LEFT NECK  Final   Special Requests PATIENT ON FOLLOWING ANCEF  Final   Gram Stain   Final    RARE WBC PRESENT, PREDOMINANTLY PMN NO ORGANISMS SEEN    Culture   Final      RARE SERRATIA MARCESCENS NO ANAEROBES ISOLATED Performed at [facility], [place] [address], [place], [place] [address]    Report Status 04/14/2017 FINAL  Final   Organism ID, Bacteria SERRATIA MARCESCENS  Final      Susceptibility   Serratia marcescens - MIC*    CEFAZOLIN >=64 RESISTANT Resistant     CEFEPIME <=1 SENSITIVE Sensitive     CEFTAZIDIME <=1 SENSITIVE Sensitive     CEFTRIAXONE <=1 SENSITIVE Sensitive     CIPROFLOXACIN <=0.25 SENSITIVE Sensitive     GENTAMICIN <=1 SENSITIVE Sensitive     TRIMETH/SULFA <=20 SENSITIVE Sensitive     * RARE SERRATIA MARCESCENS      Radiology Studies: No results found.   Scheduled Meds: . buprenorphine-naloxone  2 tablet Sublingual Daily  . DULoxetine  60 mg Oral BID  . enoxaparin (LOVENOX) injection  40 mg Subcutaneous Q24H  . ferrous sulfate  325 mg Oral TID WC  . gadobenate dimeglumine  15 mL Intravenous Once  . methocarbamol  500 mg Oral TID  . multivitamin with minerals  1 tablet Oral Daily  . nicotine  7 mg Transdermal Q24H  . OLANZapine  5 mg Oral q morning - 10a  . pantoprazole  40 mg Oral BID  . polyethylene glycol  17 g Oral Daily  . potassium chloride  40 mEq Oral BID  . senna-docusate  1 tablet Oral BID   Continuous Infusions: . cefTRIAXone (ROCEPHIN)  IV Stopped (04/14/17 0003)     LOS: 60 days   Time Spent in minutes   15 minutes  Verneita Griffes, MD [facility] [phone]

## 2017-04-15 MED ORDER — FERROUS SULFATE 325 (65 FE) MG PO TABS: 325.0000 mg | ORAL_TABLET | Freq: Three times a day (TID) | ORAL | 3 refills | Status: DC

## 2017-04-15 MED ORDER — SULFAMETHOXAZOLE-TRIMETHOPRIM 800-160 MG PO TABS: 1.0000 | ORAL_TABLET | Freq: Two times a day (BID) | ORAL | 0 refills | Status: DC

## 2017-04-15 MED ORDER — ALPRAZOLAM 0.5 MG PO TABS: 0.5000 mg | ORAL_TABLET | Freq: Two times a day (BID) | ORAL | 0 refills | Status: DC | PRN

## 2017-04-15 MED ORDER — NICOTINE 7 MG/24HR TD PT24: 7.0000 mg | MEDICATED_PATCH | TRANSDERMAL | 0 refills | Status: DC

## 2017-04-15 NOTE — Discharge Summary (Signed)
Physician Discharge Summary  EMONI WHITWORTH UYQ:[id number] DOB: 1985-01-28 DOA: 02/13/2017  PCP: Jani Gravel, MD  Admit date: 02/13/2017 Discharge date: 04/15/2017  Time spent: 35 minutes  Recommendations for Outpatient Follow-up:  1. Outpatient follow-up with Dr. Servando Snare of cardiothoracic surgery and process of being scheduled-patient aware of the same and call their office if he does not hear from them 2. Should follow-up with Suboxone treatment clinic improved  Discharge Diagnoses:  Principal Problem:   Neck abscess Active Problems:   Sepsis affecting skin   Cellulitis of left hand   Staphylococcus aureus bacteremia with sepsis (HCC)   Weakness of both lower extremities   IVDU (intravenous drug user)   MSSA bacteremia   Septic arthritis of right sternoclavicular joint (HCC)   PFO (patent foramen ovale)   Endocarditis of tricuspid valve   Chronic diastolic CHF (congestive heart failure) (HCC)   Blood per rectum   Hematuria   Pancytopenia (HCC)  Improved Discharge Condition: Improved  Diet recommendation: Heart healthy low-salt  Filed Weights   04/13/17 0500 04/14/17 0452 04/15/17 0555  Weight: 79.5 kg (175 lb 4.3 oz) 78.7 kg (173 lb 8 oz) 78.6 kg (173 lb 3.2 oz)    History of present illness:  32 year old female with history of IV drug abuse admitted on 02/13/2017 and found to have MSSA bacteremia secondary to tricuspid valve endocarditis and involvement of PFO.   Patient placed on IV antibiotics and will need to continue IV antibiotics through 04/15/2017.   Patient developed pancytopenia about 2 weeks ago, hematology consulted, thought to be medication related.  Pancytopenia has resolved.  Recent left neck swelling was noted.  Initial CT scan did not reveal an abscess.  Patient was initially started on steroids.  Next swelling has improved, and nontender.  ENT advise repeat CT of the soft tissue of the neck with contrast today, 04/08/2017.  Repeat CT soft tissue of the neck has  revealed abscess.  Patient has been kept n.p.o. overnight.  ENT plans to proceed with I&D on 04/09/2017.  Otherwise, no new complaints today, 04/08/2017.  No fever or chills, no shortness of breath, no chest pain.    She has TEE echocardiogram scheduled for Friday, 04/11/2017--this was performed as below    Hospital Course:  MSSA bacteremia and tricuspid valve endocarditis -Initially placed on IV nafcillin which was discontinued due to suspected bone marrow suppression -Infectious disease consulted and appreciated, recommended antibiotics through 04/15/2017 -currently on Ancef -Cardiology Dr. Doylene Canard repeat TEE 04/11/17--shows mobile vegetation tricuspid valve severe TR with perforation mild MR and positive for PFO --will need outpatient management with CVTS going forward for consideration of the same --Dr. Servando Snare of cardiothoracic surgery team will be getting back to her with the time and date of follow-up outpatient appointment to evaluate for possible intervention on tricuspid valve -Patient has PICC line (placed on 03/21/2017) which is to be pulled on completion of antibiotics 04/15/17  Anemia  -patient had bloody/dark stools during this admission -Suspected anemia multifactorial including possible blood loss versus severe systemic infection versus bone marrow suppression versus antibiotics -Hemoglobin stable and still stable in the 9 range--gastroenterology saw and signed off.  Did not feel upper or lower endoscopy was not warranted given that there was no overt bleeding.  Hemoglobin stabilized in the 9-10 range on discharge  Left neck swelling, positive for abscess-- I&D abscess 3/26 Wound culture growing Serratia resistant to cefazolin -ENT, Dr. Blenda Nicely, consulted and appreciated.  -s/p I& D 04/09/2017--post op management wound per Dr. Marlowe Alt  dressings 3 times a day --started on Bactrim DS X 5 days ending 4/2  --Neck wound to be packed with 1/4 inch strip gauze twice daily  until no longer fitting and patient is aware on how to do the dressings and has been taught this by the nurse  Chronic pain management/history of IV drug abuse/ anxiety and depression -Continue Cymbalta, oxycodone, Zyprexa, Tylenol as needed -Currently on Suboxone-do not escalate pain medications or doses  -Patient has been given 4 tablets of Ativan on discharge and will follow-up with pain management facility for refills of the same etc.  Pancytopenia -Resolved, Likely due to bone marrow suppression in the setting of sepsis/endocarditis -Hematology consulted and appreciated, status post bone marrow biopsy-suggested changes secondary to medication   Hypokalemia -resolved--- repeat periodically, refusing replacement on discharge  Asymptomatic bradycardia  -resolved  Left lower extremity edema -Lower extremity Doppler was negative for DVT -Encourage ambulation     Procedures: Multiple echoes resulting in last echocardiogram 3/29 showing possible sterile vegetations   Consultants ENT Gastroenterology Hematology Cardiology  Infectious disease Interventional radiology (bone marrow biopsy)  Procedures  Lower extremity doppler TEE  repeat TEE--3/29 findings as above  Antibiotics     Discharge Exam: Vitals:   04/14/17 2049 04/15/17 0555  BP: 100/75 96/73  Pulse: (!) 102 91  Resp: 18 18  Temp: 98 F (36.7 C) 98.1 F (36.7 C)  SpO2: 99% 97%    Awake alert smiling pleasant and seems happy External ocular movements intact Chest is clear Abdomen is soft nontender no rebound Wound on neck about the same No lower extremity edema  Discharge Instructions    Allergies as of 04/15/2017      Reactions   Azithromycin Nausea And Vomiting   ABDOMINAL PAIN   Nsaids    UNSPECIFIED REACTION    Hydrocodone Nausea And Vomiting   Penicillins Nausea And Vomiting   Has patient had a PCN reaction causing immediate rash, facial/tongue/throat swelling, SOB or  lightheadedness with hypotension: No Has patient had a PCN reaction causing severe rash involving mucus membranes or skin necrosis: No Has patient had a PCN reaction that required hospitalization No Has patient had a PCN reaction occurring within the last 10 years: No If all of the above answers are "NO", then may proceed with Cephalosporin use.   Prednisone Nausea Only, Other (See Comments)   Can take shot, but pill form "caused stomach pain , nausea"      Medication List    TAKE these medications   acetaminophen 500 MG tablet Commonly known as:  TYLENOL Take 1,000 mg by mouth every 6 (six) hours as needed for mild pain or moderate pain.   ALPRAZolam 0.5 MG tablet Commonly known as:  XANAX Take 1 tablet (0.5 mg total) by mouth 2 (two) times daily as needed for anxiety. What changed:  when to take this   cyclobenzaprine 10 MG tablet Commonly known as:  FLEXERIL Take 1 tablet (10 mg total) by mouth 3 (three) times daily.   DULoxetine 60 MG capsule Commonly known as:  CYMBALTA Take 60 mg by mouth 2 (two) times daily.   ferrous sulfate 325 (65 FE) MG tablet Take 1 tablet (325 mg total) by mouth 3 (three) times daily with meals.   multivitamin with minerals Tabs tablet Take 1 tablet by mouth daily. ALL NATURAL VITAMIN: Made by [employer] (system six)   nicotine 7 mg/24hr patch Commonly known as:  NICODERM CQ - dosed in mg/24 hr Place 1 patch (7  mg total) onto the skin daily.   OLANZapine 5 MG tablet Commonly known as:  ZYPREXA Take 1 tablet by mouth every morning.   ondansetron 4 MG tablet Commonly known as:  ZOFRAN Take 1 tablet (4 mg total) by mouth every 6 (six) hours. For nausea or vomiting   SUBOXONE 4-1 MG Film Generic drug:  Buprenorphine HCl-Naloxone HCl Place 2 Film under the tongue daily.      Allergies  Allergen Reactions  . Azithromycin Nausea And Vomiting    ABDOMINAL PAIN  . Nsaids     UNSPECIFIED REACTION    . Hydrocodone Nausea And Vomiting  .  Penicillins Nausea And Vomiting     Has patient had a PCN reaction causing immediate rash, facial/tongue/throat swelling, SOB or lightheadedness with hypotension: No Has patient had a PCN reaction causing severe rash involving mucus membranes or skin necrosis: No Has patient had a PCN reaction that required hospitalization No Has patient had a PCN reaction occurring within the last 10 years: No If all of the above answers are "NO", then may proceed with Cephalosporin use.   . Prednisone Nausea Only and Other (See Comments)    Can take shot, but pill form "caused stomach pain , nausea"   Follow-up Information    Helayne Seminole, MD.   Specialty:  Otolaryngology Contact information: [place] [address] [phone]        Schedule an appointment as soon as possible for a visit with Grace Isaac, MD.   Specialty:  Cardiothoracic Surgery Why:  Manuela Schwartz at Dr. Servando Snare office will call pt. after speaking with Dr for approval  Contact information: [place] [address] [phone]            The results of significant diagnostics from this hospitalization (including imaging, microbiology, ancillary and laboratory) are listed below for reference.    Significant Diagnostic Studies: Ct Soft Tissue Neck W Contrast  Result Date: 04/08/2017 CLINICAL DATA:  Initial evaluation for acute left-sided neck swelling for 1 week. History of sepsis, endocarditis. EXAM: CT NECK WITH CONTRAST TECHNIQUE: Multidetector CT imaging of the neck was performed using the standard protocol following the bolus administration of intravenous contrast. CONTRAST:  43m ISOVUE-300 IOPAMIDOL (ISOVUE-300) INJECTION 61% COMPARISON:  Prior CT from 03/26/2017 FINDINGS: Pharynx and larynx: Oral cavity within normal limits without mass lesion or loculated fluid collection. No acute inflammatory changes about the remaining dentition. Palatine tonsils  symmetric and within normal limits. Persistent mild induration with inflammatory stranding about the left tonsil, improved relative to previous exam. No discrete tonsillar or peritonsillar abscess. Nasopharynx remains clear. Swelling with induration again seen within the left parapharyngeal space extending inferiorly to approximately the level of the piriform sinus, improved relative to previous exam. Left piriform sinuses largely effaced. Remainder of the hypopharynx and supraglottic larynx within normal limits. Epiglottis normal. Vallecula clear. True cords within normal limits. Subglottic airway clear. Salivary glands: Parotid glands within normal limits. Induration with inflammatory changes within the left submandibular space related to the inflammatory process within the left neck, improved from previous. Submandibular glands themselves are within normal limits. Thyroid: Thyroid normal. Lymph nodes: Multiple enlarged left level II lymph nodes again seen. There is a new hypodense collection at left level II measuring 1.7 x 2.4 x 2.3 cm, likely a suppurative node with abscess formation (series 3, image 55). Multiple adjacent matted enhancing nodes seen at left level II and III, largest of which measures  7 mm in short axis. Changes are intimately associated with the adjacent left sternocleidomastoid muscle, consistent with myositis. Associated swelling with inflammatory fat stranding within the left submandibular and parapharyngeal spaces, extending inferiorly down the anterior left neck. Overall, swelling, inflammation, and adenopathy is improved relative to previous exam. Shotty subcentimeter right-sided cervical lymph nodes improved as well. Vascular: Normal intravascular enhancement seen throughout the neck. Aberrant right subclavian artery noted. Limited intracranial: Unremarkable. Visualized orbits: Globes and orbital soft tissues within normal limits. Mastoids and visualized paranasal sinuses: Minimal  mucosal thickening within the left sphenoid sinus. Visualized paranasal sinuses are otherwise clear. Mastoid air cells and middle ear cavities are well pneumatized and free of fluid. Skeleton: No acute osseous abnormality. No worrisome lytic or blastic osseous lesions. Upper chest: Mildly enlarged 12 mm right paratracheal node noted, likely reactive. Additional shotty subcentimeter lymph nodes noted within the upper mediastinum. Slight asymmetric soft tissue thickening about the right sternoclavicular joint again noted without significant inflammatory changes. Visualized lungs are grossly clear, although evaluation limited by motion artifact. Other: None. IMPRESSION: 1. Interval improvement in the acute inflammatory process involving the left neck, with decreased swelling and inflammatory changes within the left parapharyngeal and submandibular spaces, with decreased swelling of the left sternocleidomastoid muscle. Findings consistent with improving cellulitis and myositis. 2. Overall improved left cervical adenopathy, although there is a new 1.7 x 2.4 x 2.3 cm suppurative node/abscess positioned at left level II. Electronically Signed   By: Jeannine Boga M.D.   On: 04/08/2017 14:19   Ct Soft Tissue Neck W Contrast  Result Date: 03/26/2017 CLINICAL DATA:  Left-sided neck pain and difficulty swallowing. History of MSSA bacteremia. EXAM: CT NECK WITH CONTRAST TECHNIQUE: Multidetector CT imaging of the neck was performed using the standard protocol following the bolus administration of intravenous contrast. CONTRAST:  27m ISOVUE-300 IOPAMIDOL (ISOVUE-300) INJECTION 61% COMPARISON:  CT neck 03/20/2017 FINDINGS: Pharynx and larynx: --Nasopharynx: Fossae of Rosenmuller are clear. Normal adenoid tonsils for age. --Oral cavity and oropharynx: Increased edema of the left palatine tonsil. No associated peritonsillar abscess. --Hypopharynx: Normal vallecula and pyriform sinuses. --Larynx: Normal epiglottis and  pre-epiglottic space. Normal aryepiglottic and vocal folds. --Retropharyngeal space: No abscess or effusion. There is increased edema and/or lymphadenopathy of the retropharyngeal space at the level of the supraglottic larynx. Salivary glands: --Parotid: No mass lesion or inflammation. No sialolithiasis or ductal dilatation. --Submandibular: Unchanged reactive enlargement of the left submandibular gland. --Sublingual: Normal. No ranula or other visible lesion of the base of tongue and floor of mouth. Thyroid: Normal. Lymph nodes: Persistent multiple enlarged left cervical chain lymph nodes, measuring up to 14 mm. Left level 3 lymph nodes have increased in size, now measuring up to 12 mm, previously 8 mm. The degree of enhancement in the left-sided lymph nodes has diminished.Numerous subcentimeter right cervical lymph nodes. The right-sided adenopathy has slightly worsened. Vascular: Major cervical vessels are patent. There is increased mass effect on the left internal jugular vein, which remains widely patent. Limited intracranial: Normal. Visualized orbits: Normal. Mastoids and visualized paranasal sinuses: No fluid levels or advanced mucosal thickening. No mastoid effusion. Skeleton: Decreased soft tissue thickening at the right sternoclavicular joint. No acute osseous finding. Upper chest: Clear. Other: There is persistent inflammation of the left sternocleidomastoid with induration of the surrounding fat and thickening of the left platysma muscle. No abscess. There is increased edema within the left parapharyngeal space with associated worsening of rightward mass effect on the hypopharynx and larynx. There is edema versus lymphadenopathy within  the retropharyngeal space at the level of the supraglottic larynx, which is worsened from the prior study. IMPRESSION: 1. Worsened rightward mass effect on the hypopharynx and larynx secondary to worsened edema in the left parapharyngeal space and edema/lymphadenopathy in  the retropharyngeal space. No retropharyngeal or peritonsillar abscess. 2. Unchanged appearance of left neck cellulitis and myositis, predominantly involving the sternocleidomastoid muscle. 3. Decreased enhancement of left cervical lymph nodes, with increased size of enlarged nodes at left level 3. Slightly worsened right cervical adenopathy. 4. Improved appearance of soft tissue thickening at the right sternoclavicular joint. Electronically Signed   By: Ulyses Jarred M.D.   On: 03/26/2017 00:44   Ct Soft Tissue Neck W Contrast  Result Date: 03/20/2017 CLINICAL DATA:  Left-sided neck mass and fever EXAM: CT NECK WITH CONTRAST TECHNIQUE: Multidetector CT imaging of the neck was performed using the standard protocol following the bolus administration of intravenous contrast. CONTRAST:  20m ISOVUE-300 IOPAMIDOL (ISOVUE-300) INJECTION 61% COMPARISON:  None. FINDINGS: Pharynx and larynx: --Nasopharynx: Fossae of Rosenmuller are clear. Normal adenoid tonsils for age. --Oral cavity and oropharynx: The palatine and lingual tonsils are normal. The visible oral cavity and floor of mouth are normal. --Hypopharynx: Normal vallecula and pyriform sinuses. --Larynx: Normal epiglottis and pre-epiglottic space. Normal aryepiglottic and vocal folds. --Retropharyngeal space: No abscess, effusion or lymphadenopathy. Salivary glands: --Parotid: No mass lesion or inflammation. No sialolithiasis or ductal dilatation. --Submandibular: Reactive enlargement of left submandibular gland. Normal right submandibular gland. No sialolithiasis. --Sublingual: Normal. No ranula or other visible lesion of the base of tongue and floor of mouth. Thyroid: Normal. Lymph nodes: There are bilateral enlarged lymph nodes, predominantly within the left cervical chain. Left level II a nodes measure up to 13 mm. There are numerous subcentimeter bilateral level 5A and 5B nodes. There is a large amount of soft tissue swelling of the left neck with inflammation  of the left sternocleidomastoid muscle and inflammatory induration of the adjacent subcutaneous fat. There is thickening of the left aspect of the platysma. Inflammation extends into the left carotid and submandibular spaces. Vascular: Major cervical vessels are patent. Limited intracranial: Normal. Visualized orbits: Normal. Mastoids and visualized paranasal sinuses: No fluid levels or advanced mucosal thickening. No mastoid effusion. Skeleton: Soft tissue thickening at the right sternoclavicular joint is compatible with reported septic arthritis. Upper chest: Clear. Other: None. IMPRESSION: 1. Left sternocleidomastoid myositis with cellulitis of the adjacent subcutaneous fat and probable reactive thickening of the left platysma muscle. Inflammatory change also extends into the left submandibular and left carotid spaces. The origin of the process is not clear. If there has been no injection or penetrating injury in this area, then hematogenous spread of infection would be an alternative possibility. 2. No abscess or drainable fluid collection. 3. Reactive cervical lymphadenopathy, left worse than right, and reactive edema of the left submandibular gland. 4. Extension of inflammation to the left carotid space, but no vascular occlusion. 5. Soft tissue thickening at the right sternoclavicular joint, compatible with reported septic arthritis. Electronically Signed   By: KUlyses JarredM.D.   On: 03/20/2017 19:35   Dg Chest Port 1 View  Result Date: 03/19/2017 CLINICAL DATA:  Shortness of breath and leg pain EXAM: PORTABLE CHEST 1 VIEW COMPARISON:  02/22/2017 FINDINGS: Cardiac shadow is within normal limits. Right-sided PICC line is now seen in satisfactory position. The lungs are well aerated bilaterally without focal infiltrate or sizable effusion. No bony abnormality is noted. IMPRESSION: No acute abnormality noted. Electronically Signed   By:  Inez Catalina M.D.   On: 03/19/2017 17:42   Dg Swallowing Func-speech  Pathology  Result Date: 03/30/2017 Objective Swallowing Evaluation: Type of Study: MBS-Modified Barium Swallow Study  Patient Details Name: FELIX PRATT MRN: [id number] Date of Birth: July 15, 1985 Today's Date: 03/30/2017 Time: SLP Start Time (ACUTE ONLY): 1345 -SLP Stop Time (ACUTE ONLY): 5027 SLP Time Calculation (min) (ACUTE ONLY): 20 min Past Medical History: Past Medical History: Diagnosis Date . Anemia, unspecified  . Anxiety  . Asthma  . Disc herniation   causes sciatica unsure which disc . Esophageal reflux  . Fatty liver  . Fibromyalgia  . Gastroparesis  . Headache(784.0)  . Heart murmur  . History of narcotic addiction ([place]) 09/30/2012  2010:  Per pt, was addicted to narcotics; mom helped intervene and stopped taking opiods  . Hyperemesis gravidarum with metabolic disturbance, unspecified as to episode of care  . Irritable bowel syndrome  . MSSA bacteremia 02/20/2017 . Other and unspecified noninfectious gastroenteritis and colitis(558.9)  . Pregnant  . PTSD (post-traumatic stress disorder)  . Septic arthritis of right sternoclavicular joint ([place]) 02/20/2017 . Unspecified asthma(493.90)  Past Surgical History: Past Surgical History: Procedure Laterality Date . APPENDECTOMY   . CHOLECYSTECTOMY   . COLONOSCOPY   . LAPAROSCOPIC BILATERAL SALPINGECTOMY Bilateral 12/08/2012  Procedure: LAPAROSCOPIC BILATERAL SALPINGECTOMY;  Surgeon: Jonnie Kind, MD;  Location: [facility];  Service: Gynecology;  Laterality: Bilateral; . MULTIPLE EXTRACTIONS WITH ALVEOLOPLASTY N/A 12/01/2015  Procedure: EXTRACTION TEETH TWO, THREE, FOUR, SIX, SEVEN, EIGHT, NINE, TEN, ELEVEN, TWELVE, FOURTEEN, FIFTEEN, TWENTY, TWENTY ONE, TWENTY EIGHT, TWENTY NINE, THIRTY AND THIRTY ONE WITH ALVEOLOPLASTY;  Surgeon: Diona Browner, DDS;  Location: [facility];  Service: Oral Surgery;  Laterality: N/A; . TEE WITHOUT CARDIOVERSION N/A 02/20/2017  Procedure: TRANSESOPHAGEAL ECHOCARDIOGRAM (TEE);  Surgeon: Larey Dresser, MD;  Location[facility];  Service:  Cardiovascular;  Laterality: N/A; . TUBAL LIGATION   HPI: Pt is a 32 y.o. female, with past medical history significant for fibromyalgia, anemia, GERD, and history of IV drug abuse. She presented to the ED with a few days history of fever, chills, and generalized weakness muscle aches in addition to arthralgias.  She was admitted with diagnosis of sepsis.  Pt being treated for endocarditis. On 3/6 she developed erythema of her upper chest/L neck. CT showed myositis of L sternocleidomastoid w/ local cellulitis/ inflammation, but no abscess noted. Pt started to have difficulty swallowing and managing her secretions, so SLP and ENT were consulted on 3/12.  Subjective: pt pleasant, alert and conversant Assessment / Plan / Recommendation CHL IP CLINICAL IMPRESSIONS 03/30/2017 Clinical Impression Moderate pharyngeal dysphagia persists, with pharyngeal edema impacting swallow function, specifically epiglottic deflection and laryngeal closure. This results in laryngeal penetration of thin and nectar thick liquids during the swallow, and moderate residue in the valleculae, pyriforms and along the posterior pharyngeal wall. While the amount of penetration is decreased from prior study, there is questionable trace aspiration with nectar and pt is still at risk for aspiration of thin and nectar-thick liquids. The weight of honey thick liquids, purees facilitate epiglottic deflection and therefore pharyngeal clearance, reducing risk for aspiration after the swallow. With regular solids, there is severe vallecular residue. Compensatory techniques including chin tuck, right and left head turn did not significantly improve pharyngeal clearance. Pt reviewed study results in real time and SLP provided recommendations, rationale. Recommend she continue honey-thick liquids, purees, meds crushed for now, with good prognosis for recovery as edema improves.  SLP Visit Diagnosis Dysphagia, pharyngeal phase (  R13.13) Attention and  concentration deficit following -- Frontal lobe and executive function deficit following -- Impact on safety and function Moderate aspiration risk;Severe aspiration risk   CHL IP TREATMENT RECOMMENDATION 03/30/2017 Treatment Recommendations Therapy as outlined in treatment plan below;F/U MBS in --- days (Comment)   Prognosis 03/30/2017 Prognosis for Safe Diet Advancement Good Barriers to Reach Goals -- Barriers/Prognosis Comment -- CHL IP DIET RECOMMENDATION 03/30/2017 SLP Diet Recommendations Dysphagia 1 (Puree) solids;Honey thick liquids Liquid Administration via Cup;Spoon Medication Administration Crushed with puree Compensations Slow rate;Small sips/bites;Multiple dry swallows after each bite/sip Postural Changes --   CHL IP OTHER RECOMMENDATIONS 03/30/2017 Recommended Consults -- Oral Care Recommendations Oral care BID;Oral care before and after PO Other Recommendations Have oral suction available;Remove water pitcher;Prohibited food (jello, ice cream, thin soups);Order thickener from pharmacy   CHL IP FOLLOW UP RECOMMENDATIONS 03/30/2017 Follow up Recommendations Other (comment)   CHL IP FREQUENCY AND DURATION 03/30/2017 Speech Therapy Frequency (ACUTE ONLY) min 3x week Treatment Duration 2 weeks      CHL IP ORAL PHASE 03/30/2017 Oral Phase WFL Oral - Pudding Teaspoon -- Oral - Pudding Cup -- Oral - Honey Teaspoon -- Oral - Honey Cup -- Oral - Nectar Teaspoon -- Oral - Nectar Cup -- Oral - Nectar Straw -- Oral - Thin Teaspoon -- Oral - Thin Cup -- Oral - Thin Straw -- Oral - Puree -- Oral - Mech Soft -- Oral - Regular -- Oral - Multi-Consistency -- Oral - Pill -- Oral Phase - Comment --  CHL IP PHARYNGEAL PHASE 03/30/2017 Pharyngeal Phase Impaired Pharyngeal- Pudding Teaspoon -- Pharyngeal -- Pharyngeal- Pudding Cup -- Pharyngeal -- Pharyngeal- Honey Teaspoon Reduced anterior laryngeal mobility;Reduced laryngeal elevation;Pharyngeal residue - valleculae;Reduced epiglottic inversion;Reduced pharyngeal  peristalsis;Pharyngeal residue - pyriform Pharyngeal -- Pharyngeal- Honey Cup Reduced epiglottic inversion;Reduced anterior laryngeal mobility;Reduced laryngeal elevation;Reduced pharyngeal peristalsis;Pharyngeal residue - valleculae;Pharyngeal residue - pyriform Pharyngeal -- Pharyngeal- Nectar Teaspoon Reduced pharyngeal peristalsis;Reduced epiglottic inversion;Reduced anterior laryngeal mobility;Reduced laryngeal elevation;Reduced airway/laryngeal closure;Penetration/Aspiration during swallow;Penetration/Apiration after swallow;Pharyngeal residue - valleculae;Pharyngeal residue - pyriform;Pharyngeal residue - posterior pharnyx Pharyngeal Material enters airway, remains ABOVE vocal cords and not ejected out Pharyngeal- Nectar Cup Reduced pharyngeal peristalsis;Reduced epiglottic inversion;Reduced anterior laryngeal mobility;Reduced laryngeal elevation;Reduced airway/laryngeal closure;Penetration/Aspiration during swallow;Penetration/Apiration after swallow;Trace aspiration;Pharyngeal residue - valleculae;Pharyngeal residue - pyriform;Pharyngeal residue - posterior pharnyx Pharyngeal Material enters airway, passes BELOW cords without attempt by patient to eject out (silent aspiration) Pharyngeal- Nectar Straw -- Pharyngeal -- Pharyngeal- Thin Teaspoon Reduced pharyngeal peristalsis;Reduced epiglottic inversion;Reduced anterior laryngeal mobility;Reduced laryngeal elevation;Reduced airway/laryngeal closure;Penetration/Aspiration during swallow;Penetration/Apiration after swallow;Pharyngeal residue - valleculae;Pharyngeal residue - pyriform;Pharyngeal residue - posterior pharnyx Pharyngeal Material enters airway, remains ABOVE vocal cords and not ejected out Pharyngeal- Thin Cup Reduced pharyngeal peristalsis;Reduced epiglottic inversion;Reduced anterior laryngeal mobility;Reduced laryngeal elevation;Reduced airway/laryngeal closure;Penetration/Aspiration during swallow;Penetration/Apiration after swallow;Pharyngeal  residue - valleculae;Pharyngeal residue - pyriform;Pharyngeal residue - posterior pharnyx Pharyngeal Material enters airway, remains ABOVE vocal cords and not ejected out Pharyngeal- Thin Straw -- Pharyngeal -- Pharyngeal- Puree Reduced pharyngeal peristalsis;Reduced epiglottic inversion;Reduced anterior laryngeal mobility;Reduced laryngeal elevation;Reduced airway/laryngeal closure;Pharyngeal residue - valleculae;Pharyngeal residue - posterior pharnyx Pharyngeal -- Pharyngeal- Mechanical Soft -- Pharyngeal -- Pharyngeal- Regular Reduced pharyngeal peristalsis;Reduced epiglottic inversion;Reduced anterior laryngeal mobility;Reduced laryngeal elevation;Reduced airway/laryngeal closure;Pharyngeal residue - valleculae Pharyngeal -- Pharyngeal- Multi-consistency -- Pharyngeal -- Pharyngeal- Pill -- Pharyngeal -- Pharyngeal Comment --  CHL IP CERVICAL ESOPHAGEAL PHASE 03/30/2017 Cervical Esophageal Phase WFL Pudding Teaspoon -- Pudding Cup -- Honey Teaspoon -- Honey Cup -- Nectar Teaspoon -- Nectar Cup -- Nectar Straw -- Thin  Teaspoon -- Thin Cup -- Thin Straw -- Puree -- Mechanical Soft -- Regular -- Multi-consistency -- Pill -- Cervical Esophageal Comment -- Deneise Lever, MS, CCC-SLP Speech-Language Pathologist [phone] No flowsheet data found. Aliene Altes 03/30/2017, 3:39 PM              Dg Swallowing Func-speech Pathology  Result Date: 03/26/2017 Objective Swallowing Evaluation: Type of Study: MBS-Modified Barium Swallow Study  Patient Details Name: ROZALYN OSLAND MRN: [id number] Date of Birth: 09/20/1985 Today's Date: 03/26/2017 Time: SLP Start Time (ACUTE ONLY): 1030 -SLP Stop Time (ACUTE ONLY): 1045 SLP Time Calculation (min) (ACUTE ONLY): 15 min Past Medical History: Past Medical History: Diagnosis Date . Anemia, unspecified  . Anxiety  . Asthma  . Disc herniation   causes sciatica unsure which disc . Esophageal reflux  . Fatty liver  . Fibromyalgia  . Gastroparesis  . Headache(784.0)  . Heart murmur  .  History of narcotic addiction ([place]) 09/30/2012  2010:  Per pt, was addicted to narcotics; mom helped intervene and stopped taking opiods  . Hyperemesis gravidarum with metabolic disturbance, unspecified as to episode of care  . Irritable bowel syndrome  . MSSA bacteremia 02/20/2017 . Other and unspecified noninfectious gastroenteritis and colitis(558.9)  . Pregnant  . PTSD (post-traumatic stress disorder)  . Septic arthritis of right sternoclavicular joint ([place]) 02/20/2017 . Unspecified asthma(493.90)  Past Surgical History: Past Surgical History: Procedure Laterality Date . APPENDECTOMY   . CHOLECYSTECTOMY   . COLONOSCOPY   . LAPAROSCOPIC BILATERAL SALPINGECTOMY Bilateral 12/08/2012  Procedure: LAPAROSCOPIC BILATERAL SALPINGECTOMY;  Surgeon: Jonnie Kind, MD;  Location: [facility];  Service: Gynecology;  Laterality: Bilateral; . MULTIPLE EXTRACTIONS WITH ALVEOLOPLASTY N/A 12/01/2015  Procedure: EXTRACTION TEETH TWO, THREE, FOUR, SIX, SEVEN, EIGHT, NINE, TEN, ELEVEN, TWELVE, FOURTEEN, FIFTEEN, TWENTY, TWENTY ONE, TWENTY EIGHT, TWENTY NINE, THIRTY AND THIRTY ONE WITH ALVEOLOPLASTY;  Surgeon: Diona Browner, DDS;  Location: [facility];  Service: Oral Surgery;  Laterality: N/A; . TEE WITHOUT CARDIOVERSION N/A 02/20/2017  Procedure: TRANSESOPHAGEAL ECHOCARDIOGRAM (TEE);  Surgeon: Larey Dresser, MD;  Location: [facility];  Service: Cardiovascular;  Laterality: N/A; . TUBAL LIGATION   HPI: Pt is a 32 y.o. female, with past medical history significant for fibromyalgia, anemia, GERD, and history of IV drug abuse. She presented to the ED with a few days history of fever, chills, and generalized weakness muscle aches in addition to arthralgias.  She was admitted with diagnosis of sepsis.  Pt being treated for endocarditis. On 3/6 she developed erythema of her upper chest/L neck. CT showed myositis of L sternocleidomastoid w/ local cellulitis/ inflammation, but no abscess noted. Pt started to have difficulty swallowing and managing her  secretions, so SLP and ENT were consulted on 3/12.  Subjective: pt pleasant, alert and conversant Assessment / Plan / Recommendation CHL IP CLINICAL IMPRESSIONS 03/26/2017 Clinical Impression Pt presents at high risk for aspiration d/t pharyngeal edema that provides glottal closure. Incomplete glottal closure results in penetration during swallow and eventual aspiration of thin liquids and nectar thick liquids (via tsp, cup sips, with head turn to left/right and chin tuck). Pt with moderate residue in vallecula, posterior pharyngeal wall and pyriform sinuses when consuming thin liquids and nectar thick liquids. Despite double swallow, pt with anterior and posterior sensed aspiration of residue. With trials of honey thick liquid by cup sips and puree the bolus remained more cohesive as it transcended through pharynx. As a result, pt with no penetration during swallow and decreased pharyngeal residue. Double swallow  was effective in clearing residue from puree and honey thick liquids. As a result, recommend dysphagia 1 with honey thick liquids, medicine crushed in puree. Please provide oral care prior to PO intake.   SLP Visit Diagnosis Dysphagia, pharyngeal phase (R13.13) Attention and concentration deficit following -- Frontal lobe and executive function deficit following -- Impact on safety and function Severe aspiration risk;Risk for inadequate nutrition/hydration   CHL IP TREATMENT RECOMMENDATION 03/26/2017 Treatment Recommendations Therapy as outlined in treatment plan below   Prognosis 03/26/2017 Prognosis for Safe Diet Advancement Fair Barriers to Reach Goals Cognitive deficits Barriers/Prognosis Comment -- CHL IP DIET RECOMMENDATION 03/26/2017 SLP Diet Recommendations Dysphagia 1 (Puree) solids;Honey thick liquids Liquid Administration via Cup;Spoon Medication Administration Crushed with puree Compensations (No Data) Postural Changes Seated upright at 90 degrees;Remain semi-upright after after feeds/meals  (Comment)   CHL IP OTHER RECOMMENDATIONS 03/26/2017 Recommended Consults Consider ENT evaluation Oral Care Recommendations Oral care BID;Oral care before and after PO Other Recommendations Have oral suction available;Remove water pitcher;Prohibited food (jello, ice cream, thin soups);Order thickener from pharmacy   CHL IP FOLLOW UP RECOMMENDATIONS 03/26/2017 Follow up Recommendations (No Data)   CHL IP FREQUENCY AND DURATION 03/25/2017 Speech Therapy Frequency (ACUTE ONLY) min 3x week Treatment Duration 2 weeks      CHL IP ORAL PHASE 03/26/2017 Oral Phase WFL Oral - Pudding Teaspoon -- Oral - Pudding Cup -- Oral - Honey Teaspoon -- Oral - Honey Cup -- Oral - Nectar Teaspoon -- Oral - Nectar Cup -- Oral - Nectar Straw -- Oral - Thin Teaspoon -- Oral - Thin Cup -- Oral - Thin Straw -- Oral - Puree -- Oral - Mech Soft -- Oral - Regular -- Oral - Multi-Consistency -- Oral - Pill -- Oral Phase - Comment --  CHL IP PHARYNGEAL PHASE 03/26/2017 Pharyngeal Phase Impaired Pharyngeal- Pudding Teaspoon -- Pharyngeal -- Pharyngeal- Pudding Cup -- Pharyngeal -- Pharyngeal- Honey Teaspoon Pharyngeal residue - valleculae;Pharyngeal residue - pyriform;Pharyngeal residue - posterior pharnyx;Reduced epiglottic inversion;Reduced airway/laryngeal closure Pharyngeal -- Pharyngeal- Honey Cup Reduced epiglottic inversion;Reduced airway/laryngeal closure;Pharyngeal residue - valleculae;Pharyngeal residue - pyriform;Pharyngeal residue - posterior pharnyx;Compensatory strategies attempted (with notebox) Pharyngeal -- Pharyngeal- Nectar Teaspoon Reduced epiglottic inversion;Reduced airway/laryngeal closure;Penetration/Apiration after swallow;Penetration/Aspiration during swallow;Moderate aspiration;Pharyngeal residue - valleculae;Pharyngeal residue - pyriform;Pharyngeal residue - posterior pharnyx Pharyngeal Material enters airway, passes BELOW cords and not ejected out despite cough attempt by patient Pharyngeal- Nectar Cup Reduced epiglottic  inversion;Reduced airway/laryngeal closure;Moderate aspiration;Penetration/Aspiration during swallow;Penetration/Apiration after swallow;Pharyngeal residue - valleculae;Pharyngeal residue - pyriform;Pharyngeal residue - posterior pharnyx Pharyngeal Material enters airway, passes BELOW cords and not ejected out despite cough attempt by patient Pharyngeal- Nectar Straw -- Pharyngeal -- Pharyngeal- Thin Teaspoon -- Pharyngeal -- Pharyngeal- Thin Cup Reduced epiglottic inversion;Reduced airway/laryngeal closure;Penetration/Aspiration during swallow;Penetration/Apiration after swallow;Moderate aspiration;Pharyngeal residue - valleculae;Pharyngeal residue - pyriform;Pharyngeal residue - posterior pharnyx Pharyngeal Material enters airway, passes BELOW cords and not ejected out despite cough attempt by patient Pharyngeal- Thin Straw -- Pharyngeal -- Pharyngeal- Puree Reduced epiglottic inversion;Reduced airway/laryngeal closure;Pharyngeal residue - valleculae;Pharyngeal residue - pyriform;Pharyngeal residue - posterior pharnyx Pharyngeal -- Pharyngeal- Mechanical Soft -- Pharyngeal -- Pharyngeal- Regular -- Pharyngeal -- Pharyngeal- Multi-consistency -- Pharyngeal -- Pharyngeal- Pill -- Pharyngeal -- Pharyngeal Comment --  CHL IP CERVICAL ESOPHAGEAL PHASE 03/26/2017 Cervical Esophageal Phase WFL Pudding Teaspoon -- Pudding Cup -- Honey Teaspoon -- Honey Cup -- Nectar Teaspoon -- Nectar Cup -- Nectar Straw -- Thin Teaspoon -- Thin Cup -- Thin Straw -- Puree -- Mechanical Soft -- Regular -- Multi-consistency -- Pill -- Cervical  Esophageal Comment -- No flowsheet data found. Happi Overton 03/26/2017, 12:06 PM              Ct Bone Marrow Biopsy & Aspiration  Result Date: 03/24/2017 CLINICAL DATA:  Pancytopenia and neutropenia. MSSA bacteremia. History of IV drug abuse, bacterial endocarditis and right sternoclavicular septic arthritis. EXAM: CT GUIDED BONE MARROW ASPIRATION AND BIOPSY ANESTHESIA/SEDATION: Versed 4.0 mg IV,  Fentanyl 150 mcg IV Total Moderate Sedation Time:   25 minutes. The patient's level of consciousness and physiologic status were continuously monitored during the procedure by Radiology nursing. PROCEDURE: The procedure risks, benefits, and alternatives were explained to the patient's mother. Questions regarding the procedure were encouraged and answered. The patient's mother understands and consents to the procedure. A time out was performed prior to initiating the procedure. The right gluteal region was prepped with chlorhexidine. Sterile gown and sterile gloves were used for the procedure. Local anesthesia was provided with 1% Lidocaine. Under CT guidance, an 11 gauge On Control bone cutting needle was advanced from a posterior approach into the right iliac bone. Needle positioning was confirmed with CT. Initial non heparinized and heparinized aspirate samples were obtained of bone marrow. Core biopsy was performed via the On Control drill needle. Two separate core biopsy samples were obtained. COMPLICATIONS: None FINDINGS: Inspection of initial aspirate did reveal visible particles. Intact core biopsy samples were obtained. A second core biopsy sample was obtained and placed in a sterile container in the event that culture analysis is necessary. IMPRESSION: CT guided bone marrow biopsy of right posterior iliac bone with both aspirate and core samples obtained. Electronically Signed   By: Aletta Edouard M.D.   On: 03/24/2017 11:59   [place] Ekg Site Rite  Result Date: 03/21/2017 If Site Rite image not attached, placement could not be confirmed due to current cardiac rhythm.   Microbiology: Recent Results (from the past 240 hour(s))  Surgical PCR screen     Status: None   Collection Time: 04/09/17 12:48 AM  Result Value Ref Range Status   MRSA, PCR NEGATIVE NEGATIVE Final   Staphylococcus aureus NEGATIVE NEGATIVE Final    Comment: (NOTE) The Xpert SA Assay (FDA approved for NASAL specimens in patients  38 years of age and older), is one component of a comprehensive surveillance program. It is not intended to diagnose infection nor to guide or monitor treatment. Performed at [facility], [place] [address], [place], [place] [address]   Aerobic/Anaerobic Culture (surgical/deep wound)     Status: None   Collection Time: 04/09/17  7:54 AM  Result Value Ref Range Status   Specimen Description ABSCESS LEFT NECK  Final   Special Requests PATIENT ON FOLLOWING ANCEF  Final   Gram Stain   Final    RARE WBC PRESENT, PREDOMINANTLY PMN NO ORGANISMS SEEN    Culture   Final    RARE SERRATIA MARCESCENS NO ANAEROBES ISOLATED Performed at [facility], [place] [address] [place],  [address]    Report Status 04/14/2017 FINAL  Final   Organism ID, Bacteria SERRATIA MARCESCENS  Final      Susceptibility   Serratia marcescens - MIC*    CEFAZOLIN >=64 RESISTANT Resistant     CEFEPIME <=1 SENSITIVE Sensitive     CEFTAZIDIME <=1 SENSITIVE Sensitive     CEFTRIAXONE <=1 SENSITIVE Sensitive     CIPROFLOXACIN <=0.25 SENSITIVE Sensitive     GENTAMICIN <=1 SENSITIVE Sensitive     TRIMETH/SULFA <=20 SENSITIVE Sensitive     *  RARE SERRATIA MARCESCENS     Labs: Basic Metabolic Panel: Recent Labs  Lab 04/10/17 0424 04/11/17 0451 04/14/17 0441  NA 141 138 139  K 3.4* 3.9 4.3  CL 104 102 101  CO2 _0 GLUCOSE 90 88 98  BUN _1 CREATININE 0.60 0.56 0.66  CALCIUM 8.2* 8.5* 9.4   Liver Function Tests: No results for input(s): AST, ALT, ALKPHOS, BILITOT, PROT, ALBUMIN in the last 168 hours. No results for input(s): LIPASE, AMYLASE in the last 168 hours. No results for input(s): AMMONIA in the last 168 hours. CBC: Recent Labs  Lab 04/10/17 0424 04/14/17 0441  WBC 11.8* 9.3  NEUTROABS 6.6 4.7  HGB 9.1* 10.2*  HCT 28.8* 32.8*  MCV 97.0 94.3  PLT 286 293   Cardiac Enzymes: No results for input(s): CKTOTAL, CKMB, CKMBINDEX, TROPONINI in the last 168 hours. BNP: BNP  (last 3 results) Recent Labs    03/07/17 1728  BNP 222.5*    ProBNP (last 3 results) No results for input(s): PROBNP in the last 8760 hours.  CBG: Recent Labs  Lab 04/09/17 1728  GLUCAP 83       Signed:  Nita Sells MD   [facility] 04/15/2017, 9:11 AM

## 2017-04-15 NOTE — Progress Notes (Signed)
Patient discharge teaching given, including activity, diet, follow-up appoints, and medications. Patient verbalized understanding of all discharge instructions. IV access was d/c'd. Vitals are stable. Skin is intact except as charted in most recent assessments. Pt to be escorted out by NT, to be driven home by family. 

## 2017-04-15 NOTE — Progress Notes (Signed)
Pt. Had questions about xanax prescription. MD paged. Pt. Anxious to leave and doesn't want to wait for MD response.

## 2017-04-15 NOTE — Care Management Note (Signed)
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  [name] (Mother) Javier DockerVernon Gammon (Stepfather)    [phone]      PCP: [name]  Action/Plan: Transition to home today with parents.  Expected Discharge Date:  04/15/17               Expected Discharge Plan:  Home/Self Care  In-House Referral:     Discharge planning Services  CM Consult  Post Acute Care Choice:   N/A Choice offered to:   N/A  DME Arranged:   N/A DME Agency:   N/A  HH Arranged:    N/A HH Agency:   N/A  Status of Service:   completed  If discussed at Long Length of Stay Meetings, dates discussed:    Additional Comments:  [name], [name], RN 04/15/2017, 11:08 AM

## 2017-04-18 ENCOUNTER — Other Ambulatory Visit: Payer: Self-pay | Admitting: Neurology

## 2017-04-22 ENCOUNTER — Emergency Department (HOSPITAL_COMMUNITY)
Admission: EM | Admit: 2017-04-22 | Discharge: 2017-04-22 | Disposition: A | Discharge status: Home / Self Care | Payer: Medicaid Other | Attending: Emergency Medicine | Admitting: Emergency Medicine

## 2017-04-22 ENCOUNTER — Encounter (HOSPITAL_COMMUNITY): Payer: Self-pay | Admitting: *Deleted

## 2017-04-22 ENCOUNTER — Other Ambulatory Visit: Payer: Self-pay

## 2017-04-22 DIAGNOSIS — R55 Syncope and collapse: Secondary | ICD-10-CM | POA: Insufficient documentation

## 2017-04-22 DIAGNOSIS — Z5321 Procedure and treatment not carried out due to patient leaving prior to being seen by health care provider: Secondary | ICD-10-CM | POA: Insufficient documentation

## 2017-04-22 NOTE — ED Notes (Signed)
Registration reports that the patient left at 2025. Patient called three times at various intervals with no response.

## 2017-04-22 NOTE — ED Notes (Signed)
Pt still not in waiting room 

## 2017-04-22 NOTE — ED Triage Notes (Addendum)
states that she at home today and a bystander saw her laying in the driveway, family reports that pt was hour and half at home unconscious before family called ems, pt alert upon ems arrival to house, pt denies any pain, recently admitted at [facility] for [facility].

## 2017-04-22 NOTE — ED Notes (Signed)
Patient called to triage with no response. 

## 2017-04-22 NOTE — ED Notes (Signed)
RN went to call pt for triage, registration advised that pt was outside smoking,

## 2017-05-01 ENCOUNTER — Emergency Department (HOSPITAL_COMMUNITY): Payer: Medicaid Other

## 2017-05-01 ENCOUNTER — Other Ambulatory Visit: Payer: Self-pay

## 2017-05-01 ENCOUNTER — Encounter (HOSPITAL_COMMUNITY): Payer: Self-pay

## 2017-05-01 ENCOUNTER — Emergency Department (HOSPITAL_COMMUNITY)
Admission: EM | Admit: 2017-05-01 | Discharge: 2017-05-02 | Disposition: A | Discharge status: Home / Self Care | Payer: Medicaid Other | Attending: Emergency Medicine | Admitting: Emergency Medicine

## 2017-05-01 DIAGNOSIS — I5032 Chronic diastolic (congestive) heart failure: Secondary | ICD-10-CM | POA: Diagnosis not present

## 2017-05-01 DIAGNOSIS — J45909 Unspecified asthma, uncomplicated: Secondary | ICD-10-CM | POA: Diagnosis not present

## 2017-05-01 DIAGNOSIS — F17210 Nicotine dependence, cigarettes, uncomplicated: Secondary | ICD-10-CM | POA: Insufficient documentation

## 2017-05-01 DIAGNOSIS — R2243 Localized swelling, mass and lump, lower limb, bilateral: Secondary | ICD-10-CM | POA: Diagnosis present

## 2017-05-01 DIAGNOSIS — R600 Localized edema: Secondary | ICD-10-CM

## 2017-05-01 DIAGNOSIS — Z79899 Other long term (current) drug therapy: Secondary | ICD-10-CM | POA: Diagnosis not present

## 2017-05-01 DIAGNOSIS — R609 Edema, unspecified: Secondary | ICD-10-CM | POA: Insufficient documentation

## 2017-05-01 LAB — CBC WITH DIFFERENTIAL/PLATELET
BASOS PCT: 0 %
Basophils Absolute: 0 10*3/uL (ref 0.0–0.1)
EOS ABS: 0.4 10*3/uL (ref 0.0–0.7)
EOS PCT: 7 %
HEMATOCRIT: 38.8 % (ref 36.0–46.0)
Hemoglobin: 12.4 g/dL (ref 12.0–15.0)
Lymphocytes Relative: 44 %
Lymphs Abs: 2.9 10*3/uL (ref 0.7–4.0)
MCH: 30.2 pg (ref 26.0–34.0)
MCHC: 32 g/dL (ref 30.0–36.0)
MCV: 94.6 fL (ref 78.0–100.0)
MONO ABS: 0.4 10*3/uL (ref 0.1–1.0)
MONOS PCT: 7 %
Neutro Abs: 2.7 10*3/uL (ref 1.7–7.7)
Neutrophils Relative %: 42 %
Platelets: 313 10*3/uL (ref 150–400)
RBC: 4.1 MIL/uL (ref 3.87–5.11)
RDW: 15.4 % (ref 11.5–15.5)
WBC: 6.4 10*3/uL (ref 4.0–10.5)

## 2017-05-01 LAB — BASIC METABOLIC PANEL
Anion gap: 12 (ref 5–15)
BUN: 7 mg/dL (ref 6–20)
CALCIUM: 9.5 mg/dL (ref 8.9–10.3)
CO2: 23 mmol/L (ref 22–32)
CREATININE: 0.62 mg/dL (ref 0.44–1.00)
Chloride: 105 mmol/L (ref 101–111)
GFR calc Af Amer: >60 mL/min (ref >60)
Glucose, Bld: 104 mg/dL — ABNORMAL HIGH (ref 65–99)
Potassium: 3.6 mmol/L (ref 3.5–5.1)
SODIUM: 140 mmol/L (ref 135–145)

## 2017-05-01 NOTE — ED Triage Notes (Signed)
Pt outside smoking,

## 2017-05-01 NOTE — ED Triage Notes (Signed)
Pt presents with 2 day h/o BLE swelling.  Pt reports taking her grandfather's lasix w/o relief;  Pt denies any chest pain or shortness of breath.  Pt reports being very sleepy.

## 2017-05-01 NOTE — ED Notes (Signed)
Pt called no answer 

## 2017-05-01 NOTE — ED Notes (Signed)
Pt called,no answer.

## 2017-05-02 LAB — POC URINE PREG, ED: Preg Test, Ur: NEGATIVE

## 2017-05-02 LAB — BRAIN NATRIURETIC PEPTIDE: B NATRIURETIC PEPTIDE 5: 22 pg/mL (ref 0.0–100.0)

## 2017-05-02 MED ORDER — FUROSEMIDE 20 MG PO TABS
40.0000 mg | ORAL_TABLET | Freq: Once | ORAL | Status: AC
  Administered 2017-05-02: 40 mg via ORAL
  Filled 2017-05-02: qty 2

## 2017-05-02 NOTE — ED Provider Notes (Signed)
[facility] EMERGENCY [facility]PART[facility] Provider Note   CSN: [id number] Arrival date & time: 05/01/17  1457     History   Chief Complaint Chief Complaint  Patient presents with  . Leg Swelling    HPI [name] is a 32 y.o. female.  HPI Patient presents to the emergency department with bilateral lower extremity swelling that she is noticed over the last few days.  The patient states that she was recently admitted to the hospital for infective endocarditis.  The patient states that she had some swelling at that time as well.  She states that they did give her some Lasix while she was in the hospital but she is not been sent home with any.  Patient states that the swelling did seem to improve when she elevate the legs above the level of her heart.  Patient states she has had no chest pain or shortness of breath or exertional symptoms.  Patient states she has not had any fever or lethargy.  Patient states she did take some of her grandfathers Lasix shortly after being discharged from the hospital for her leg swelling.  States that this does not seem to help at that time other than elevating her legs.  Patient states that she is not having pain in her legs and no redness or increased warmth.  The patient denies chest pain, shortness of breath, headache,blurred vision, neck pain, fever, cough, weakness, numbness, dizziness, anorexia,abdominal pain, nausea, vomiting, diarrhea, rash, back pain, dysuria, hematemesis, bloody stool, near syncope, or syncope. Past Medical History:  Diagnosis Date  . Anemia, unspecified   . Anxiety   . Asthma   . Disc herniation    causes sciatica unsure which disc  . Esophageal reflux   . Fatty liver   . Fibromyalgia   . Gastroparesis   . Headache(784.0)   . Heart murmur   . History of narcotic addiction (HCC) 09/30/2012   2010:  Per pt, was addicted to narcotics; mom helped intervene and stopped taking opiods   . Hyperemesis gravidarum with  metabolic disturbance, unspecified as to episode of care   . Irritable bowel syndrome   . MSSA bacteremia 02/20/2017  . Other and unspecified noninfectious gastroenteritis and colitis(558.9)   . Pregnant   . PTSD (post-traumatic stress disorder)   . Septic arthritis of right sternoclavicular joint (HCC) 02/20/2017  . Unspecified asthma(493.90)     Patient Active Problem List   Diagnosis Date Noted  . Pancytopenia (HCC)   . Neck abscess 03/25/2017  . Blood per rectum   . Hematuria   . Chronic diastolic CHF (congestive heart failure) (HCC) 03/07/2017  . PFO (patent foramen ovale)   . Endocarditis of tricuspid valve   . MSSA bacteremia 02/20/2017  . Septic arthritis of right sternoclavicular joint (HCC) 02/20/2017  . Cellulitis of left hand   . Staphylococcus aureus bacteremia with sepsis (HCC)   . Weakness of both lower extremities   . IVDU (intravenous drug user)   . Pelvic pain   . Sepsis affecting skin 02/13/2017  . Sterilization consult 12/03/2012  . Fibromyalgia 03/19/2012  . GERD 02/02/2009  . IRRITABLE BOWEL SYNDROME 02/02/2009  . COLITIS 04/29/2008  . ANXIETY 04/28/2008  . ASTHMA 04/28/2008  . GASTROPARESIS 04/28/2008    Past Surgical History:  Procedure Laterality Date  . APPENDECTOMY    . CHOLECYSTECTOMY    . COLONOSCOPY    . INCISION AND DRAINAGE ABSCESS Left 04/09/2017   Procedure: INCISION AND DRAINAGE ABSCESS OF  NECK;  Surgeon: Graylin Shiver, MD;  Location: [facility] OR;  Service: ENT;  Laterality: Left;  . LAPAROSCOPIC BILATERAL SALPINGECTOMY Bilateral 12/08/2012   Procedure: LAPAROSCOPIC BILATERAL SALPINGECTOMY;  Surgeon: Tilda Burrow, MD;  Location: [facility] ORS;  Service: Gynecology;  Laterality: Bilateral;  . MULTIPLE EXTRACTIONS WITH ALVEOLOPLASTY N/A 12/01/2015   Procedure: EXTRACTION TEETH TWO, THREE, FOUR, SIX, SEVEN, EIGHT, NINE, TEN, ELEVEN, TWELVE, FOURTEEN, FIFTEEN, TWENTY, TWENTY ONE, TWENTY EIGHT, TWENTY NINE, THIRTY AND THIRTY ONE WITH ALVEOLOPLASTY;   Surgeon: Ocie Doyne, DDS;  Location: [facility] OR;  Service: Oral Surgery;  Laterality: N/A;  . TEE WITHOUT CARDIOVERSION N/A 02/20/2017   Procedure: TRANSESOPHAGEAL ECHOCARDIOGRAM (TEE);  Surgeon: Laurey Morale, MD;  Location: [facility] ENDOSCOPY;  Service: Cardiovascular;  Laterality: N/A;  . TEE WITHOUT CARDIOVERSION N/A 04/11/2017   Procedure: TRANSESOPHAGEAL ECHOCARDIOGRAM (TEE);  Surgeon: Orpah Cobb, MD;  Location: [facility] ENDOSCOPY;  Service: Cardiovascular;  Laterality: N/A;  . TUBAL LIGATION       OB History    Gravida  3   Para  3   Term  3   Preterm      AB      Living  3     SAB      TAB      Ectopic      Multiple      Live Births  3            Home Medications    Prior to Admission medications   Medication Sig Start Date End Date Taking? Authorizing Provider  acetaminophen (TYLENOL) 500 MG tablet Take 1,000 mg by mouth every 6 (six) hours as needed for mild pain or moderate pain.   Yes [provider]  ALPRAZolam (XANAX) 0.5 MG tablet Take 1 tablet (0.5 mg total) by mouth 2 (two) times daily as needed for anxiety. 04/15/17  Yes Rhetta Mura, MD  Buprenorphine HCl-Naloxone HCl (SUBOXONE) 4-1 MG FILM Place 2 Film under the tongue daily.    Yes [provider]  ferrous sulfate 325 (65 FE) MG tablet Take 1 tablet (325 mg total) by mouth 3 (three) times daily with meals. 04/15/17  Yes Rhetta Mura, MD  Multiple Vitamin (MULTIVITAMIN WITH MINERALS) TABS tablet Take 1 tablet by mouth daily. ALL NATURAL VITAMIN: Made by Romie Jumper (system six)   Yes [provider]  cyclobenzaprine (FLEXERIL) 10 MG tablet Take 1 tablet (10 mg total) by mouth 3 (three) times daily. 02/11/17   Ivery Quale, PA-C  DULoxetine (CYMBALTA) 60 MG capsule Take 60 mg by mouth 2 (two) times daily.    [provider]  nicotine (NICODERM CQ - DOSED IN MG/24 HR) 7 mg/24hr patch Place 1 patch (7 mg total) onto the skin daily. 04/15/17   Rhetta Mura, MD    OLANZapine (ZYPREXA) 5 MG tablet Take 1 tablet by mouth every morning.    [provider]  ondansetron (ZOFRAN) 4 MG tablet Take 1 tablet (4 mg total) by mouth every 6 (six) hours. For nausea or vomiting 02/11/17   Ivery Quale, PA-C  sulfamethoxazole-trimethoprim (BACTRIM DS,SEPTRA DS) 800-160 MG tablet Take 1 tablet by mouth 2 (two) times daily. 04/15/17   Veryl Speak, FNP    Family History Family History  Problem Relation Age of Onset  . Ulcerative colitis Paternal Grandmother   . Cancer Paternal Grandmother   . Colon polyps Paternal Grandfather   . Cancer Paternal Grandfather        thyroid  . Diabetes Father   .  Heart disease Father   . Kidney disease Father   . Hypertension Father     Social History Social History   Tobacco Use  . Smoking status: Current Every Day Smoker    Packs/day: 2.00    Years: 10.00    Pack years: 20.00    Types: Cigarettes  . Smokeless tobacco: Never Used  Substance Use Topics  . Alcohol use: No  . Drug use: No    Types: IV    Comment: pt denies 11/30/15     Allergies   Azithromycin; Nsaids; Hydrocodone; Penicillins; and Prednisone   Review of Systems Review of Systems All other systems negative except as documented in the HPI. All pertinent positives and negatives as reviewed in the HPI.   Physical Exam Updated Vital Signs BP 109/80   Pulse 85   Temp 98.7 F (37.1 C) (Oral)   Resp 14   LMP  (LMP Unknown) Comment: Pt is unsure when her last period was.  She hwas in the hospital for several months.  SpO2 100%   Physical Exam  Constitutional: She is oriented to person, place, and time. She appears well-developed and well-nourished. No distress.  HENT:  Head: Normocephalic and atraumatic.  Mouth/Throat: Oropharynx is clear and moist.  Eyes: Pupils are equal, round, and reactive to light.  Neck: Normal range of motion. Neck supple.  Cardiovascular: Normal rate, regular rhythm and normal heart sounds. Exam reveals  no gallop and no friction rub.  No murmur heard. Pulmonary/Chest: Effort normal and breath sounds normal. No respiratory distress. She has no wheezes.  Abdominal: Soft. Bowel sounds are normal. She exhibits no distension. There is no tenderness.  Musculoskeletal: She exhibits edema.  Neurological: She is alert and oriented to person, place, and time. She exhibits normal muscle tone. Coordination normal.  Skin: Skin is warm and dry. Capillary refill takes less than 2 seconds. No rash noted. No erythema.  Psychiatric: She has a normal mood and affect. Her behavior is normal.  Nursing note and vitals reviewed.    ED Treatments / Results  Labs (all labs ordered are listed, but only abnormal results are displayed) Labs Reviewed  BASIC METABOLIC PANEL - Abnormal; Notable for the following components:      Result Value   Glucose, Bld 104 (*)    All other components within normal limits  CBC WITH DIFFERENTIAL/PLATELET  BRAIN NATRIURETIC PEPTIDE  POC URINE PREG, ED    EKG None  Radiology Dg Chest 2 View  Result Date: 05/01/2017 CLINICAL DATA:  Bilateral lower extremity, swelling and fatigue for 3 weeks. Discharged from hospital 3 weeks ago for endocarditis. EXAM: CHEST - 2 VIEW COMPARISON:  Chest radiograph March 19, 2017 FINDINGS: Cardiomediastinal silhouette is normal. No pleural effusions or focal consolidations. LEFT lung base strandy densities. Trachea projects midline and there is no pneumothorax. Soft tissue planes and included osseous structures are non-suspicious. Interval removal of RIGHT PICC without retained fragments. IMPRESSION: LEFT lung base atelectasis/scarring. Electronically Signed   By: Awilda Metro M.D.   On: 05/01/2017 17:13    Procedures Procedures (including critical care time)  Medications Ordered in ED Medications - No data to display   Initial Impression / Assessment and Plan / ED Course  I have reviewed the triage vital signs and the nursing  notes.  Pertinent labs & imaging results that were available during my care of the patient were reviewed by me and considered in my medical decision making (see chart for details).    I  feel that the patient's lower extremity edema has not had any significant etiology that can be determined at this time she is not showing any signs of congestive heart failure and no signs of blood clot or vascular insufficiency that she has good warmth color sensation and pulses in her lower extremities.  I feel that the patient will need further workup by Dr. Algie Coffer who she is seeing when she was in the hospital she did have a normal ejection fraction and wall motion on her echo before discharge from hospital.  She is due to see a cardiothoracic surgeon due to the vegetations on her valves.  I feel that the patient would benefit from follow-up with Dr. Algie Coffer just to ensure that there is no further issues with this edema in her legs.  She is showing no signs at this time of acute distress.  I do not feel that she is having an endocarditis issue at this point as she is having no fevers or systemic symptoms.  Final Clinical Impressions(s) / ED Diagnoses   Final diagnoses:  None    ED Discharge Orders    None       Charlestine Night, PA-C 05/02/17 1105    Rolan Bucco, MD 05/02/17 1125

## 2017-05-02 NOTE — ED Notes (Addendum)
Patients father approached this tech inquiring about his daughters wait time. The father stated she has been her since Thursday 05/01/17 @ 1400. This tech was not familiar with his daughters chart, apologized and explained I would look into immediatly. The father continued stating he is friends with the President Dyanne CarrelMickey Foster and he is making a call to him. This tech informed Heriberto AntiguaLori RN about the situation and began looking into it and providing support. The patient had been taken out of the system before Friday morning shift began. The patient and family was informed we were working to get the patient back to a room and again apologized for the confusion. Patients father stated he was waiting for a return call from [facility].

## 2017-05-02 NOTE — Discharge Instructions (Addendum)
Follow up with Dr. Algie CofferKadakia for a recheck of this swelling. Return here as needed. Your lab testing was reassuring that there is no signs of heart failure at this time.

## 2017-05-06 ENCOUNTER — Ambulatory Visit: Payer: Medicaid Other | Admitting: Family

## 2017-05-06 NOTE — Progress Notes (Deleted)
Subjective:    Patient ID: [name], female    DOB: May 31, 1985, 32 y.o.   MRN: [id number]  No chief complaint on file.   HPI:  [name] is a 32 y.o. female who presents today for an initial visit following hospitalization for endocarditis and bacteremia.   Ms. [name] was initially admitted to the hospital on 02/13/17 with the chief complaint of increasing weakness and fatigue. She was also experiencing pain in her left hand, right shoulder and legs. 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 She remained in the hospital for the duration of her therapy secondary to history of IVDU to receive 6 weeks of Ancef for MSSA infection. Her hospital stay was complicated with the development of a neck abscess that required I&D with cultures revealing Serratia that was resistant to cefazolin. She was changed to ceftriaxone for the remainder of her therapy. She was given 1 week of oral Bactrim at discharge for the Serratia infection. She completed her therapy on 04/15/17. All hospital records, labs and imaging were reviewed in detail.  Since leaving the hospital she has had follow up with ENT for her neck abscess and noted to be healing well with no complications. She was also seen in the ED on 05/01/17 with concern for peripheral edema with instructions to follow up with cardiology and cardiothoracic surgery.    Allergies  Allergen Reactions  . Azithromycin Nausea And Vomiting    ABDOMINAL PAIN  . Nsaids     UNSPECIFIED REACTION    . Hydrocodone Nausea And Vomiting  . Penicillins Nausea And Vomiting     Has patient had a PCN reaction causing immediate rash, facial/tongue/throat swelling, SOB or lightheadedness with hypotension: No Has patient had a PCN reaction causing severe rash involving mucus membranes or skin necrosis: No Has patient had a PCN reaction that required  hospitalization No Has patient had a PCN reaction occurring within the last 10 years: No If all of the above answers are "NO", then may proceed with Cephalosporin use.   . Prednisone Nausea Only and Other (See Comments)    Can take shot, but pill form "caused stomach pain , nausea"      Outpatient Medications Prior to Visit  Medication Sig Dispense Refill  . acetaminophen (TYLENOL) 500 MG tablet Take 1,000 mg by mouth every 6 (six) hours as needed for mild pain or moderate pain.    Marland Kitchen ALPRAZolam (XANAX) 0.5 MG tablet Take 1 tablet (0.5 mg total) by mouth 2 (two) times daily as needed for anxiety. 4 tablet 0  . Buprenorphine HCl-Naloxone HCl (SUBOXONE) 4-1 MG FILM Place 2 Film under the tongue daily.     . cyclobenzaprine (FLEXERIL) 10 MG tablet Take 1 tablet (10 mg total) by mouth 3 (three) times daily. 12 tablet 0  . DULoxetine (CYMBALTA) 60 MG capsule Take 60 mg by mouth 2 (two) times daily.    . ferrous sulfate 325 (65 FE) MG tablet Take 1 tablet (325 mg total) by mouth 3 (three) times daily with meals. 90 tablet 3  . Multiple Vitamin (MULTIVITAMIN WITH MINERALS) TABS tablet Take 1 tablet by mouth daily. ALL NATURAL VITAMIN: Made by Romie Jumper (system six)    . nicotine (NICODERM CQ - DOSED IN MG/24 HR) 7 mg/24hr patch Place 1 patch (7 mg total) onto the skin daily. 28 patch 0  . OLANZapine (ZYPREXA) 5 MG tablet Take 1 tablet  by mouth every morning.    . ondansetron (ZOFRAN) 4 MG tablet Take 1 tablet (4 mg total) by mouth every 6 (six) hours. For nausea or vomiting 12 tablet 0  . sulfamethoxazole-trimethoprim (BACTRIM DS,SEPTRA DS) 800-160 MG tablet Take 1 tablet by mouth 2 (two) times daily. 14 tablet 0   No facility-administered medications prior to visit.      Past Medical History:  Diagnosis Date  . Anemia, unspecified   . Anxiety   . Asthma   . Disc herniation    causes sciatica unsure which disc  . Esophageal reflux   . Fatty liver   . Fibromyalgia   . Gastroparesis   .  Headache(784.0)   . Heart murmur   . History of narcotic addiction (HCC) 09/30/2012   2010:  Per pt, was addicted to narcotics; mom helped intervene and stopped taking opiods   . Hyperemesis gravidarum with metabolic disturbance, unspecified as to episode of care   . Irritable bowel syndrome   . MSSA bacteremia 02/20/2017  . Other and unspecified noninfectious gastroenteritis and colitis(558.9)   . Pregnant   . PTSD (post-traumatic stress disorder)   . Septic arthritis of right sternoclavicular joint (HCC) 02/20/2017  . Unspecified asthma(493.90)       Past Surgical History:  Procedure Laterality Date  . APPENDECTOMY    . CHOLECYSTECTOMY    . COLONOSCOPY    . INCISION AND DRAINAGE ABSCESS Left 04/09/2017   Procedure: INCISION AND DRAINAGE ABSCESS OF NECK;  Surgeon: Graylin ShiverMarcellino, Amanda J, MD;  [facility];  Service: ENT;  Laterality: Left;  . LAPAROSCOPIC BILATERAL SALPINGECTOMY Bilateral 12/08/2012   Procedure: LAPAROSCOPIC BILATERAL SALPINGECTOMY;  Surgeon: Tilda BurrowJohn V Ferguson, MD;  Location: [facility] ORS;  Service: Gynecology;  Laterality: Bilateral;  . MULTIPLE EXTRACTIONS WITH ALVEOLOPLASTY N/A 12/01/2015   Procedure: EXTRACTION TEETH TWO, THREE, FOUR, SIX, SEVEN, EIGHT, NINE, TEN, ELEVEN, TWELVE, FOURTEEN, FIFTEEN, TWENTY, TWENTY ONE, TWENTY EIGHT, TWENTY NINE, THIRTY AND THIRTY ONE WITH ALVEOLOPLASTY;  Surgeon: Ocie DoyneScott Jensen, DDS;  Location: [facility] OR;  Service: Oral Surgery;  Laterality: N/A;  . TEE WITHOUT CARDIOVERSION N/A 02/20/2017   Procedure: TRANSESOPHAGEAL ECHOCARDIOGRAM (TEE);  Surgeon: Laurey MoraleMcLean, Dalton S, MD;  [facility];  Service: Cardiovascular;  Laterality: N/A;  . TEE WITHOUT CARDIOVERSION N/A 04/11/2017   Procedure: TRANSESOPHAGEAL ECHOCARDIOGRAM (TEE);  Surgeon: Orpah CobbKadakia, Ajay, MD;  [facility];  Service: Cardiovascular;  Laterality: N/A;  . TUBAL LIGATION        Family History  Problem Relation Age of Onset  . Ulcerative colitis Paternal Grandmother   .  Cancer Paternal Grandmother   . Colon polyps Paternal Grandfather   . Cancer Paternal Grandfather        thyroid  . Diabetes Father   . Heart disease Father   . Kidney disease Father   . Hypertension Father       Social History   Socioeconomic History  . Marital status: Single    Spouse name: Not on file  . Number of children: 2  . Years of education: Not on file  . Highest education level: Not on file  Occupational History  . Occupation: CNA  Social Needs  . Financial resource strain: Not on file  . Food insecurity:    Worry: Not on file    Inability: Not on file  . Transportation needs:    Medical: Not on file    Non-medical: Not on file  Tobacco Use  . Smoking status: Current Every Day Smoker  Packs/day: 2.00    Years: 10.00    Pack years: 20.00    Types: Cigarettes  . Smokeless tobacco: Never Used  Substance and Sexual Activity  . Alcohol use: No  . Drug use: No    Types: IV    Comment: pt denies 11/30/15  . Sexual activity: Never    Birth control/protection: Surgical  Lifestyle  . Physical activity:    Days per week: Not on file    Minutes per session: Not on file  . Stress: Not on file  Relationships  . Social connections:    Talks on phone: Not on file    Gets together: Not on file    Attends religious service: Not on file    Active member of club or organization: Not on file    Attends meetings of clubs or organizations: Not on file    Relationship status: Not on file  . Intimate partner violence:    Fear of current or ex partner: Not on file    Emotionally abused: Not on file    Physically abused: Not on file    Forced sexual activity: Not on file  Other Topics Concern  . Not on file  Social History Narrative   Daily caffeine       Review of Systems     Objective:    LMP  (LMP Unknown) Comment: Pt is unsure when her last period was.  She hwas in the hospital for several months. Nursing note and vital signs reviewed.  Physical  Exam      Assessment & Plan:   Problem List Items Addressed This Visit    None       I am having Elwin Mocha. Laforge maintain her multivitamin with minerals, acetaminophen, DULoxetine, OLANZapine, cyclobenzaprine, ondansetron, Buprenorphine HCl-Naloxone HCl, ALPRAZolam, nicotine, ferrous sulfate, and sulfamethoxazole-trimethoprim.   No orders of the defined types were placed in this encounter.    Follow-up: No follow-ups on file.  Jeanine Luz, FNP [facility]

## 2017-06-26 ENCOUNTER — Other Ambulatory Visit: Payer: Self-pay

## 2017-06-26 ENCOUNTER — Ambulatory Visit: Payer: Medicaid Other | Admitting: Cardiothoracic Surgery

## 2017-06-26 ENCOUNTER — Encounter: Payer: Self-pay | Admitting: Cardiothoracic Surgery

## 2017-06-26 VITALS — BP 98/70 | HR 71 | Resp 18 | Ht 67.0 in | Wt 182.0 lb

## 2017-06-26 DIAGNOSIS — I330 Acute and subacute infective endocarditis: Secondary | ICD-10-CM

## 2017-06-26 NOTE — Progress Notes (Signed)
[address]       [place] [id number]             [phone]                    [name] Medical Record #[id number] Date of Birth: 07/04/1985  Referring: Pearson GrippeKim, James, MD Primary Care: Pearson GrippeKim, James, MD Primary Cardiologist: No primary care provider on file.  Chief Complaint:    Chief Complaint  Patient presents with  . Endocarditis    f/u TEE 04/11/2017    History of Present Illness:    Tammy ArabCasey M [name] 32 y.o. 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  She had a prolonged course of IV antibiotics in the hospital, was ultimately discharged home on 1 week of p.o. Antibiotics and since the only follow-up visit she has had his 2 days after discharge in the emergency room, and a brief visit with the ENT service who drained a neck abscess during her hospitalization.  She notes that overall she is feeling well, denies fever chills night sweats.  Denies shortness of breath.  Leg swelling that she had initially after discharge has disappeared.  She notes that since discharge she has not used any IV drugs.      Current Activity/ Functional Status:  Patient is independent with mobility/ambulation, transfers, ADL's, IADL's.   Zubrod Score: At the time of surgery this patient's most appropriate activity status/level should be described as: []     0    Normal activity, no symptoms [x]     1    Restricted in physical strenuous activity but ambulatory, able to do out light work []     2    Ambulatory and capable of self care, unable to do work activities, up and about               >50 % of waking hours                              []     3    Only limited self care, in bed greater than 50% of waking hours []     4    Completely disabled, no self care, confined to bed or chair []     5    Moribund   Past Medical History:  Diagnosis Date  . Anemia,  unspecified   . Anxiety   . Asthma   . Disc herniation    causes sciatica unsure which disc  . Esophageal reflux   . Fatty liver   . Fibromyalgia   . Gastroparesis   . Headache(784.0)   . Heart murmur   . History of narcotic addiction (HCC) 09/30/2012   2010:  Per pt, was addicted to narcotics; mom helped intervene and stopped taking opiods   . Hyperemesis gravidarum with metabolic disturbance, unspecified as to episode of care   . Irritable bowel syndrome   . MSSA bacteremia 02/20/2017  . Other and unspecified noninfectious gastroenteritis and colitis(558.9)   . Pregnant   . PTSD (post-traumatic stress disorder)   . Septic arthritis of right sternoclavicular joint (HCC) 02/20/2017  . Unspecified asthma(493.90)     Past Surgical History:  Procedure Laterality Date  . APPENDECTOMY    . CHOLECYSTECTOMY    . COLONOSCOPY    . INCISION  AND DRAINAGE ABSCESS Left 04/09/2017   Procedure: INCISION AND DRAINAGE ABSCESS OF NECK;  Surgeon: Graylin Shiver, MD;  Location: [facility] OR;  Service: ENT;  Laterality: Left;  . LAPAROSCOPIC BILATERAL SALPINGECTOMY Bilateral 12/08/2012   Procedure: LAPAROSCOPIC BILATERAL SALPINGECTOMY;  Surgeon: Tilda Burrow, MD;  Location: [facility] ORS;  Service: Gynecology;  Laterality: Bilateral;  . MULTIPLE EXTRACTIONS WITH ALVEOLOPLASTY N/A 12/01/2015   Procedure: EXTRACTION TEETH TWO, THREE, FOUR, SIX, SEVEN, EIGHT, NINE, TEN, ELEVEN, TWELVE, FOURTEEN, FIFTEEN, TWENTY, TWENTY ONE, TWENTY EIGHT, TWENTY NINE, THIRTY AND THIRTY ONE WITH ALVEOLOPLASTY;  Surgeon: Ocie Doyne, DDS;  Location: [facility] OR;  Service: Oral Surgery;  Laterality: N/A;  . TEE WITHOUT CARDIOVERSION N/A 02/20/2017   Procedure: TRANSESOPHAGEAL ECHOCARDIOGRAM (TEE);  Surgeon: Laurey Morale, MD;  Location: [facility] ENDOSCOPY;  Service: Cardiovascular;  Laterality: N/A;  . TEE WITHOUT CARDIOVERSION N/A 04/11/2017   Procedure: TRANSESOPHAGEAL ECHOCARDIOGRAM (TEE);  Surgeon: Orpah Cobb, MD;  Location: [facility] ENDOSCOPY;   Service: Cardiovascular;  Laterality: N/A;  . TUBAL LIGATION      Family History  Problem Relation Age of Onset  . Ulcerative colitis Paternal Grandmother   . Cancer Paternal Grandmother   . Colon polyps Paternal Grandfather   . Cancer Paternal Grandfather        thyroid  . Diabetes Father   . Heart disease Father   . Kidney disease Father   . Hypertension Father      Social History   Tobacco Use  Smoking Status Current Every Day Smoker  . Packs/day: 1.50  . Years: 10.00  . Pack years: 15.00  . Types: Cigarettes  Smokeless Tobacco Never Used    Social History   Substance and Sexual Activity  Alcohol Use No     Allergies  Allergen Reactions  . Azithromycin Nausea And Vomiting    ABDOMINAL PAIN  . Nsaids     UNSPECIFIED REACTION    . Hydrocodone Nausea And Vomiting  . Penicillins Nausea And Vomiting     Has patient had a PCN reaction causing immediate rash, facial/tongue/throat swelling, SOB or lightheadedness with hypotension: No Has patient had a PCN reaction causing severe rash involving mucus membranes or skin necrosis: No Has patient had a PCN reaction that required hospitalization No Has patient had a PCN reaction occurring within the last 10 years: No If all of the above answers are "NO", then may proceed with Cephalosporin use.   . Prednisone Nausea Only and Other (See Comments)    Can take shot, but pill form "caused stomach pain , nausea"    Current Outpatient Medications  Medication Sig Dispense Refill  . acetaminophen (TYLENOL) 500 MG tablet Take 1,000 mg by mouth every 6 (six) hours as needed for mild pain or moderate pain.    Marland Kitchen ALPRAZolam (XANAX) 0.5 MG tablet Take 1 tablet (0.5 mg total) by mouth 2 (two) times daily as needed for anxiety. 4 tablet 0  . Buprenorphine HCl-Naloxone HCl (SUBOXONE) 4-1 MG FILM Place 2 Film under the tongue daily.     . cyclobenzaprine (FLEXERIL) 10 MG tablet Take 1 tablet (10 mg total) by mouth 3 (three) times  daily. 12 tablet 0  . DULoxetine (CYMBALTA) 60 MG capsule Take 60 mg by mouth 2 (two) times daily.    . ferrous sulfate 325 (65 FE) MG tablet Take 1 tablet (325 mg total) by mouth 3 (three) times daily with meals. 90 tablet 3  . Multiple Vitamin (MULTIVITAMIN WITH MINERALS) TABS tablet Take 1 tablet by mouth  daily. ALL NATURAL VITAMIN: Made by Romie Jumper (system six)    . nicotine (NICODERM CQ - DOSED IN MG/24 HR) 7 mg/24hr patch Place 1 patch (7 mg total) onto the skin daily. 28 patch 0  . OLANZapine (ZYPREXA) 5 MG tablet Take 1 tablet by mouth every morning.    . ondansetron (ZOFRAN) 4 MG tablet Take 1 tablet (4 mg total) by mouth every 6 (six) hours. For nausea or vomiting 12 tablet 0  . sulfamethoxazole-trimethoprim (BACTRIM DS,SEPTRA DS) 800-160 MG tablet Take 1 tablet by mouth 2 (two) times daily. 14 tablet 0  . vitamin B-12 (CYANOCOBALAMIN) 1000 MCG tablet Take by mouth.     No current facility-administered medications for this visit.     Pertinent items are noted in HPI.   Review of Systems:     Cardiac Review of Systems: [Y] = yes  or   [ N ] = no   Chest Pain [  n  ]  Resting SOB [ n  ] Exertional SOB  [n  ]  Orthopnea [ n ]   Pedal Edema [ n  ]    Palpitations [n  ] Syncope  [n  ]   Presyncope [ n  ]   General Review of Systems: [Y] = yes [  ]=no Constitional: recent weight change [  ];  Wt loss over the last 3 months [   ] anorexia [  ]; fatigue [  ]; nausea [  ]; night sweats [  ]; fever [  ]; or chills [  ];           Eye : blurred vision [  ]; diplopia [   ]; vision changes [  ];  Amaurosis fugax[  ]; Resp: cough [  ];  wheezing[  ];  hemoptysis[  ]; shortness of breath[  ]; paroxysmal nocturnal dyspnea[  ]; dyspnea on exertion[  ]; or orthopnea[  ];  GI:  gallstones[  ], vomiting[  ];  dysphagia[  ]; melena[  ];  hematochezia [  ]; heartburn[  ];   Hx of  Colonoscopy[  ]; GU: kidney stones [  ]; hematuria[  ];   dysuria [  ];  nocturia[  ];  history of     obstruction [  ];  urinary frequency [  ]             Skin: rash, swelling[  ];, hair loss[  ];  peripheral edema[  ];  or itching[  ]; Musculosketetal: myalgias[  ];  joint swelling[  ];  joint erythema[  ];  joint pain[  ];  back pain[  ];  Heme/Lymph: bruising[  ];  bleeding[  ];  anemia[  ];  Neuro: TIA[  ];  headaches[  ];  stroke[  ];  vertigo[  ];  seizures[  ];   paresthesias[  ];  difficulty walking[  ];  Psych:depression[  ]; anxiety[  ];  Endocrine: diabetes[  ];  thyroid dysfunction[  ];  Immunizations: Flu up to date [  ]; Pneumococcal up to date [  ];  Other:    PHYSICAL EXAMINATION: BP 98/70 (BP Location: Left Arm, Patient Position: Sitting, Cuff Size: Normal)   Pulse 71   Resp 18   Ht 5\' 7"  (1.702 m)   Wt 182 lb (82.6 kg)   SpO2 96% Comment: RA  BMI 28.51 kg/m  General appearance: alert, cooperative and no distress Head: Normocephalic, without obvious abnormality, atraumatic Neck: no adenopathy,  no carotid bruit, no JVD, supple, symmetrical, trachea midline and thyroid not enlarged, symmetric, no tenderness/mass/nodules Lymph nodes: Cervical, supraclavicular, and axillary nodes normal. Resp: clear to auscultation bilaterally Back: symmetric, no curvature. ROM normal. No CVA tenderness. Cardio: regular rate and rhythm, S1, S2 normal, no murmur, click, rub or gallop GI: soft, non-tender; bowel sounds normal; no masses,  no organomegaly Extremities: extremities normal, atraumatic, no cyanosis or edema and Homans sign is negative, no sign of DVT Neurologic: Grossly normal Left neck incision where she had drainage of abscess is well-healed without drainage.  Diagnostic Studies & Laboratory data:     Recent Radiology Findings:   No results found.   I have independently reviewed the above radiology studies  and reviewed the findings with the patient.   Recent Lab Findings: Lab Results  Component Value Date   WBC 6.4 05/01/2017   HGB 12.4 05/01/2017   HCT 38.8 05/01/2017   PLT 313  05/01/2017   GLUCOSE 104 (H) 05/01/2017   ALT 9 (L) 03/22/2017   AST 11 (L) 03/22/2017   NA 140 05/01/2017   K 3.6 05/01/2017   CL 105 05/01/2017   CREATININE 0.62 05/01/2017   BUN 7 05/01/2017   CO2 23 05/01/2017   TSH 1.740 02/03/2009   INR 1.05 04/11/2017   HGBA1C 5.6 02/03/2009      Assessment / Plan:   Patient returns to cardiac surgery office after prolonged hospitalization for tricuspid valve endocarditis.  From the information I see at this point there is no indication for her to need cardiac surgery.  However will make arrangements for her to have the proper follow-up with infectious disease and cardiology and will obtain a follow-up echocardiogram.   I  spent 40 minutes with  the patient face to face and greater then 50% of the time was spent in counseling and coordination of care.    Delight Ovens MD      [address] [place] [place] [address] Office [phone]   Beeper [phone]  06/26/2017 7:21 PM

## 2017-08-05 ENCOUNTER — Encounter: Payer: Self-pay | Admitting: Infectious Diseases

## 2017-08-05 ENCOUNTER — Ambulatory Visit (INDEPENDENT_AMBULATORY_CARE_PROVIDER_SITE_OTHER): Payer: Medicaid Other | Admitting: Infectious Diseases

## 2017-08-05 VITALS — BP 108/72 | HR 63 | Temp 98.8°F | Wt 197.0 lb

## 2017-08-05 DIAGNOSIS — I079 Rheumatic tricuspid valve disease, unspecified: Secondary | ICD-10-CM

## 2017-08-05 DIAGNOSIS — M009 Pyogenic arthritis, unspecified: Secondary | ICD-10-CM | POA: Diagnosis not present

## 2017-08-05 DIAGNOSIS — I368 Other nonrheumatic tricuspid valve disorders: Secondary | ICD-10-CM | POA: Diagnosis present

## 2017-08-05 DIAGNOSIS — F1990 Other psychoactive substance use, unspecified, uncomplicated: Secondary | ICD-10-CM

## 2017-08-05 DIAGNOSIS — R7881 Bacteremia: Secondary | ICD-10-CM | POA: Diagnosis not present

## 2017-08-05 DIAGNOSIS — A4101 Sepsis due to Methicillin susceptible Staphylococcus aureus: Secondary | ICD-10-CM | POA: Diagnosis not present

## 2017-08-05 DIAGNOSIS — L0211 Cutaneous abscess of neck: Secondary | ICD-10-CM | POA: Diagnosis not present

## 2017-08-05 NOTE — Progress Notes (Signed)
Patient: [name]  DOB: 11/13/1985 MRN: [id number] PCP: Pearson GrippeKim, James, MD  Referring Provider: Hospital Follow Up   Patient Active Problem List   Diagnosis Date Noted  . Endocarditis of tricuspid valve     Priority: High  . Pancytopenia (HCC)     Priority: Medium  . Blood per rectum   . Hematuria   . Chronic diastolic CHF (congestive heart failure) (HCC) 03/07/2017  . PFO (patent foramen ovale)   . Weakness of both lower extremities   . Injection of illicit drug within last 12 months   . Fibromyalgia 03/19/2012  . GERD 02/02/2009  . IRRITABLE BOWEL SYNDROME 02/02/2009  . COLITIS 04/29/2008  . ANXIETY 04/28/2008  . ASTHMA 04/28/2008  . GASTROPARESIS 04/28/2008     Subjective:  [name] is a 32 y.o. 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 She initially presented in early February of 2019 with sepsis and found to have MSSA bacteremia. TEE during hospitalization also revealed a PFO in addition to TV endocarditis with leaflet perforation and regurgitation. She completed 7 weeks of cefazolin therapy inpatient until she required neck abscess due to myositis/cellulitis and abscess which was found to have serratia. She was transitioned to Ceftriaxone for 4 days of therapy and then discharged on bactrim orally x 7d to complete. She was discharged on 05-02-17 and has since followed up with her ENT team that told her she was healing nicely; wound is now closed. She has also had follow up with Dr. Tyrone SageGerhardt in June 2019; at the time of that visit she was feeling well and had no signs of heart failure complications related to endocarditis. She has not yet followed up with cardiology team.   She has a few concerns today including nausea, leg swelling and rash on left ear. She notices nausea occurs most mornings; does not vomit but sometimes it deters her from eating. She has noticed some weight gain lately but uncertain  as to how much, clothes just tighter. Sleeps comfortably flat at night. Does notice that she gets breathless walking more than 50 yards to which she needs to stop and rest.   She has been doing great work maintaining her sobriety since discharge and working with counselors several days a week, NA meetings and using suboxone. She tells me she has   Review of Systems  Constitutional: Negative for chills and fever.  HENT: Negative for tinnitus.   Eyes: Negative for blurred vision and photophobia.  Respiratory: Positive for shortness of breath. Negative for cough and sputum production.   Cardiovascular: Positive for leg swelling. Negative for chest pain.  Gastrointestinal: Positive for nausea. Negative for diarrhea and vomiting.  Genitourinary: Negative for dysuria.  Skin: Negative for rash.  Neurological: Negative for headaches.    Past Medical History:  Diagnosis Date  . Anemia, unspecified   . Anxiety   . Asthma   . Disc herniation    causes sciatica unsure which disc  . Esophageal reflux   . Fatty liver   . Fibromyalgia   . Gastroparesis   . Headache(784.0)   . Heart murmur   . History of narcotic addiction (HCC) 09/30/2012   2010:  Per pt, was addicted to narcotics; mom helped intervene and stopped taking opiods   . Hyperemesis gravidarum with metabolic disturbance, unspecified as to episode of care   . Irritable bowel syndrome   . MSSA bacteremia 02/20/2017  .  Other and unspecified noninfectious gastroenteritis and colitis(558.9)   . Pregnant   . PTSD (post-traumatic stress disorder)   . Septic arthritis of right sternoclavicular joint (HCC) 02/20/2017  . Unspecified asthma(493.90)     Outpatient Medications Prior to Visit  Medication Sig Dispense Refill  . acetaminophen (TYLENOL) 500 MG tablet Take 1,000 mg by mouth every 6 (six) hours as needed for mild pain or moderate pain.    Marland Kitchen ALPRAZolam (XANAX) 0.5 MG tablet Take 1 tablet (0.5 mg total) by mouth 2 (two) times daily as  needed for anxiety. 4 tablet 0  . Buprenorphine HCl-Naloxone HCl (SUBOXONE) 4-1 MG FILM Place 2 Film under the tongue daily.     . cyclobenzaprine (FLEXERIL) 10 MG tablet Take 1 tablet (10 mg total) by mouth 3 (three) times daily. 12 tablet 0  . DULoxetine (CYMBALTA) 60 MG capsule Take 60 mg by mouth 2 (two) times daily.    . ferrous sulfate 325 (65 FE) MG tablet Take 1 tablet (325 mg total) by mouth 3 (three) times daily with meals. 90 tablet 3  . Multiple Vitamin (MULTIVITAMIN WITH MINERALS) TABS tablet Take 1 tablet by mouth daily. ALL NATURAL VITAMIN: Made by Romie Jumper (system six)    . nicotine (NICODERM CQ - DOSED IN MG/24 HR) 7 mg/24hr patch Place 1 patch (7 mg total) onto the skin daily. 28 patch 0  . ondansetron (ZOFRAN) 4 MG tablet Take 1 tablet (4 mg total) by mouth every 6 (six) hours. For nausea or vomiting 12 tablet 0  . vitamin B-12 (CYANOCOBALAMIN) 1000 MCG tablet Take by mouth.    . OLANZapine (ZYPREXA) 5 MG tablet Take 1 tablet by mouth every morning.    . sulfamethoxazole-trimethoprim (BACTRIM DS,SEPTRA DS) 800-160 MG tablet Take 1 tablet by mouth 2 (two) times daily. 14 tablet 0   No facility-administered medications prior to visit.      Allergies  Allergen Reactions  . Azithromycin Nausea And Vomiting    ABDOMINAL PAIN  . Nsaids     UNSPECIFIED REACTION    . Hydrocodone Nausea And Vomiting  . Penicillins Nausea And Vomiting     Has patient had a PCN reaction causing immediate rash, facial/tongue/throat swelling, SOB or lightheadedness with hypotension: No Has patient had a PCN reaction causing severe rash involving mucus membranes or skin necrosis: No Has patient had a PCN reaction that required hospitalization No Has patient had a PCN reaction occurring within the last 10 years: No If all of the above answers are "NO", then may proceed with Cephalosporin use.   . Prednisone Nausea Only and Other (See Comments)    Can take shot, but pill form "caused stomach pain  , nausea"    Social History   Tobacco Use  . Smoking status: Current Every Day Smoker    Packs/day: 1.50    Years: 10.00    Pack years: 15.00    Types: Cigarettes  . Smokeless tobacco: Never Used  Substance Use Topics  . Alcohol use: No  . Drug use: No    Types: IV    Comment: pt denies 11/30/15    Family History  Problem Relation Age of Onset  . Ulcerative colitis Paternal Grandmother   . Cancer Paternal Grandmother   . Colon polyps Paternal Grandfather   . Cancer Paternal Grandfather        thyroid  . Diabetes Father   . Heart disease Father   . Kidney disease Father   . Hypertension Father  Objective:   Vitals:   08/05/17 1100  BP: 108/72  Pulse: 63  Temp: 98.8 F (37.1 C)  TempSrc: Oral  Weight: 197 lb (89.4 kg)   Body mass index is 30.85 kg/m.  Physical Exam  Constitutional: She is oriented to person, place, and time.  Seated comfortably in the chair today. Well-groomed and appropriately dressed. Good spirits and smiling.   HENT:  Mouth/Throat: No oral lesions. No dental abscesses.  Eyes: No scleral icterus.  Neck: Neck supple. No JVD present.  Cardiovascular: Normal rate, regular rhythm and normal heart sounds.  No murmur heard. Pulmonary/Chest: Effort normal and breath sounds normal. No respiratory distress.  Abdominal: Soft. She exhibits no distension. There is no tenderness.  Musculoskeletal: Normal range of motion. She exhibits no edema or tenderness.  Lymphadenopathy:    She has no cervical adenopathy.  Neurological: She is alert and oriented to person, place, and time.  Skin: Skin is warm and dry. No rash noted.  Psychiatric: She has a normal mood and affect. Her behavior is normal. Judgment and thought content normal.  Vitals reviewed.   Lab Results: Lab Results  Component Value Date   WBC 6.4 05/01/2017   HGB 12.4 05/01/2017   HCT 38.8 05/01/2017   MCV 94.6 05/01/2017   PLT 313 05/01/2017    Lab Results  Component Value Date    CREATININE 0.62 05/01/2017   BUN 7 05/01/2017   NA 140 05/01/2017   K 3.6 05/01/2017   CL 105 05/01/2017   CO2 23 05/01/2017    Lab Results  Component Value Date   ALT 9 (L) 03/22/2017   AST 11 (L) 03/22/2017   ALKPHOS 69 03/22/2017   BILITOT 2.0 (H) 03/22/2017     Assessment & Plan:   Problem List Items Addressed This Visit      Cardiovascular and Mediastinum   Endocarditis of tricuspid valve - Primary    She is now 3 months out from completing 6 weeks of treatment for MSSA endocarditis secondary to injection drug use. She has no fevers, chills or night sweats and hemodynamically stable. At this point from infection stand point I feel she has been cured. No need to repeat blood cultures today as she is not ill-appearing. She does have complaints of dyspnea on exertion, leg swelling and nausea today. I do not appreciate a murmur on exam and she is without edema today. History of gastroparesis and likely related to that vs heart failure due to valvular dysfunction. She has not had follow up with cardiology - will place referral to get her established for to repeat her echo to follow PFO and TV. Discussed that with staph aureus it can cause destruction of the valves that leaves the leaflets more vulnerable to repeat infection in the future.  Follow up with cardiology and primary care team.       Relevant Orders   Ambulatory referral to Cardiology     Musculoskeletal and Integument   RESOLVED: Septic arthritis of right sternoclavicular joint [facility])    Exam benign today for any concern for relapsing infection. Seems she has demonstrated a good clinical cure following treatment with cefazolin. No further follow up likely needed at this time.         Other   RESOLVED: Staphylococcus aureus bacteremia with sepsis (HCC)    Secondary to endocarditis, septic arthritis. No signs of sepsis today.  Resolved.       RESOLVED: Neck abscess    Neck exam benign and previous  incision well  healed following tx with trimethoprim-sulfamethoxazole. She has already had follow up with ENT - no further follow up needed.  Resolved.       RESOLVED: MSSA bacteremia   Injection of illicit drug within last 12 months    Congratulated her on her sobriety. We discussed that often people who use suboxone therapy get pressure to "come off" this medication as well - I asked her to consider that she may be someone that requires life-long replacement therapy and she agrees. She feels well supported in her personal life and is doing great work through counseling and [employer].  She will continue with this.         Return if symptoms worsen or fail to improve.  Rexene Alberts, MSN, NP-C [facility] for Infectious Disease [facility] Pager: [phone] Office: [phone]  08/09/17  2:33 PM

## 2017-08-05 NOTE — Patient Instructions (Addendum)
For your ear - try hydrocortisone over the counter 1% cream 2-3 times a day to see if this helps. It does not look infected which is good.   Will arrange for a follow up with Cardiology so they can get your echocardiogram (ultrasound of the heart) done.   Consider trying over the counter prilosec or pepcid (generic is perfect) for 10-14 days to see if it helps your nausea. If not please discuss next steps with your primary care team.   From my standpoint you look wonderful and I am confident we have cured your infection.

## 2017-08-09 NOTE — Assessment & Plan Note (Signed)
Neck exam benign and previous incision well healed following tx with trimethoprim-sulfamethoxazole. She has already had follow up with ENT - no further follow up needed.  Resolved.

## 2017-08-09 NOTE — Assessment & Plan Note (Addendum)
She is now 3 months out from completing 6 weeks of treatment for MSSA endocarditis secondary to injection drug use. She has no fevers, chills or night sweats and hemodynamically stable. At this point from infection stand point I feel she has been cured. No need to repeat blood cultures today as she is not ill-appearing. She does have complaints of dyspnea on exertion, leg swelling and nausea today. I do not appreciate a murmur on exam and she is without edema today. History of gastroparesis and likely related to that vs heart failure due to valvular dysfunction. She has not had follow up with cardiology - will place referral to get her established for to repeat her echo to follow PFO and TV. Discussed that with staph aureus it can cause destruction of the valves that leaves the leaflets more vulnerable to repeat infection in the future.  Follow up with cardiology and primary care team.

## 2017-08-09 NOTE — Assessment & Plan Note (Signed)
Secondary to endocarditis, septic arthritis. No signs of sepsis today.  Resolved.

## 2017-08-09 NOTE — Assessment & Plan Note (Addendum)
Exam benign today for any concern for relapsing infection. Seems she has demonstrated a good clinical cure following treatment with cefazolin. No further follow up likely needed at this time.

## 2017-08-09 NOTE — Assessment & Plan Note (Signed)
Congratulated her on her sobriety. We discussed that often people who use suboxone therapy get pressure to "come off" this medication as well - I asked her to consider that she may be someone that requires life-long replacement therapy and she agrees. She feels well supported in her personal life and is doing great work through counseling and [employer] meetings.  She will continue with this.

## 2017-09-11 NOTE — Progress Notes (Deleted)
Cardiology Office Note   Date:  09/11/2017   ID:  [name], DOB 07/12/1985, MRN [id number]  PCP:  Pearson GrippeKim, James, MD  Cardiologist:   Charlton HawsPeter Hines Kloss, MD   No chief complaint on file.     History of Present Illness: [name] is a 32 y.o. female who presents for ***    Past Medical History:  Diagnosis Date  . Anemia, unspecified   . Anxiety   . Asthma   . Disc herniation    causes sciatica unsure which disc  . Esophageal reflux   . Fatty liver   . Fibromyalgia   . Gastroparesis   . Headache(784.0)   . Heart murmur   . History of narcotic addiction (HCC) 09/30/2012   2010:  Per pt, was addicted to narcotics; mom helped intervene and stopped taking opiods   . Hyperemesis gravidarum with metabolic disturbance, unspecified as to episode of care   . Irritable bowel syndrome   . MSSA bacteremia 02/20/2017  . Other and unspecified noninfectious gastroenteritis and colitis(558.9)   . Pregnant   . PTSD (post-traumatic stress disorder)   . Septic arthritis of right sternoclavicular joint (HCC) 02/20/2017  . Unspecified asthma(493.90)     Past Surgical History:  Procedure Laterality Date  . APPENDECTOMY    . CHOLECYSTECTOMY    . COLONOSCOPY    . INCISION AND DRAINAGE ABSCESS Left 04/09/2017   Procedure: INCISION AND DRAINAGE ABSCESS OF NECK;  Surgeon: Graylin ShiverMarcellino, Amanda J, MD;  [facility];  Service: ENT;  Laterality: Left;  . LAPAROSCOPIC BILATERAL SALPINGECTOMY Bilateral 12/08/2012   Procedure: LAPAROSCOPIC BILATERAL SALPINGECTOMY;  Surgeon: Tilda BurrowJohn V Ferguson, MD;  Location: [facility];  Service: Gynecology;  Laterality: Bilateral;  . MULTIPLE EXTRACTIONS WITH ALVEOLOPLASTY N/A 12/01/2015   Procedure: EXTRACTION TEETH TWO, THREE, FOUR, SIX, SEVEN, EIGHT, NINE, TEN, ELEVEN, TWELVE, FOURTEEN, FIFTEEN, TWENTY, TWENTY ONE, TWENTY EIGHT, TWENTY NINE, THIRTY AND THIRTY ONE WITH ALVEOLOPLASTY;  Surgeon: Ocie DoyneScott Jensen, DDS;  Location: [facility] OR;  Service: Oral Surgery;  Laterality: N/A;   . TEE WITHOUT CARDIOVERSION N/A 02/20/2017   Procedure: TRANSESOPHAGEAL ECHOCARDIOGRAM (TEE);  Surgeon: Laurey MoraleMcLean, Dalton S, MD;  [facility];  Service: Cardiovascular;  Laterality: N/A;  . TEE WITHOUT CARDIOVERSION N/A 04/11/2017   Procedure: TRANSESOPHAGEAL ECHOCARDIOGRAM (TEE);  Surgeon: Orpah CobbKadakia, Ajay, MD;  [facility];  Service: Cardiovascular;  Laterality: N/A;  . TUBAL LIGATION       Current Outpatient Medications  Medication Sig Dispense Refill  . acetaminophen (TYLENOL) 500 MG tablet Take 1,000 mg by mouth every 6 (six) hours as needed for mild pain or moderate pain.    Marland Kitchen. ALPRAZolam (XANAX) 0.5 MG tablet Take 1 tablet (0.5 mg total) by mouth 2 (two) times daily as needed for anxiety. 4 tablet 0  . Buprenorphine HCl-Naloxone HCl (SUBOXONE) 4-1 MG FILM Place 2 Film under the tongue daily.     . cyclobenzaprine (FLEXERIL) 10 MG tablet Take 1 tablet (10 mg total) by mouth 3 (three) times daily. 12 tablet 0  . DULoxetine (CYMBALTA) 60 MG capsule Take 60 mg by mouth 2 (two) times daily.    . ferrous sulfate 325 (65 FE) MG tablet Take 1 tablet (325 mg total) by mouth 3 (three) times daily with meals. 90 tablet 3  . Multiple Vitamin (MULTIVITAMIN WITH MINERALS) TABS tablet Take 1 tablet by mouth daily. ALL NATURAL VITAMIN: Made by [employer] (system six)    . nicotine (NICODERM CQ - DOSED IN MG/24 HR) 7 mg/24hr  patch Place 1 patch (7 mg total) onto the skin daily. 28 patch 0  . ondansetron (ZOFRAN) 4 MG tablet Take 1 tablet (4 mg total) by mouth every 6 (six) hours. For nausea or vomiting 12 tablet 0  . vitamin B-12 (CYANOCOBALAMIN) 1000 MCG tablet Take by mouth.     No current facility-administered medications for this visit.     Allergies:   Azithromycin; Nsaids; Hydrocodone; Penicillins; and Prednisone    Social History:  The patient  reports that she has been smoking cigarettes. She has a 15.00 pack-year smoking history. She has never used smokeless tobacco. She  reports that she does not drink alcohol or use drugs.   Family History:  The patient's family history includes Cancer in her paternal grandfather and paternal grandmother; Colon polyps in her paternal grandfather; Diabetes in her father; Heart disease in her father; Hypertension in her father; Kidney disease in her father; Ulcerative colitis in her paternal grandmother.    ROS:  Please see the history of present illness.   Otherwise, review of systems are positive for {NONE DEFAULTED:18576::"none"}.   All other systems are reviewed and negative.    PHYSICAL EXAM: VS:  There were no vitals taken for this visit. , BMI There is no height or weight on file to calculate BMI. Affect appropriate Healthy:  appears stated age HEENT: normal Neck supple with no adenopathy JVP normal no bruits no thyromegaly Lungs clear with no wheezing and good diaphragmatic motion Heart:  S1/S2 no murmur, no rub, gallop or click PMI normal Abdomen: benighn, BS positve, no tenderness, no AAA no bruit.  No HSM or HJR Distal pulses intact with no bruits No edema Neuro non-focal Skin warm and dry No muscular weakness    EKG:  ***   Recent Labs: 03/22/2017: ALT 9 04/05/2017: Magnesium 1.8 05/01/2017: B Natriuretic Peptide 22.0; BUN 7; Creatinine, Ser 0.62; Hemoglobin 12.4; Platelets 313; Potassium 3.6; Sodium 140    Lipid Panel No results found for: CHOL, TRIG, HDL, CHOLHDL, VLDL, LDLCALC, LDLDIRECT    Wt Readings from Last 3 Encounters:  08/05/17 197 lb (89.4 kg)  06/26/17 182 lb (82.6 kg)  04/15/17 173 lb 3.2 oz (78.6 kg)      Other studies Reviewed: Additional studies/ records that were reviewed today include: ***.    ASSESSMENT AND PLAN:  1.  ***   Current medicines are reviewed at length with the patient today.  The patient {ACTIONS; HAS/DOES NOT HAVE:19233} concerns regarding medicines.  The following changes have been made:  {PLAN; NO CHANGE:13088:s}  Labs/ tests ordered today  include: *** No orders of the defined types were placed in this encounter.    Disposition:   FU with ***     Signed, Charlton Haws, MD  09/11/2017 2:42 PM    [facility] [address] [place], [place], [place]  [address] Phone: [phone]; Fax: [phone]

## 2017-09-18 ENCOUNTER — Encounter: Payer: Self-pay | Admitting: Cardiology

## 2017-09-18 ENCOUNTER — Ambulatory Visit: Payer: Medicaid Other | Admitting: Cardiovascular Disease

## 2017-09-19 ENCOUNTER — Ambulatory Visit: Payer: Medicaid Other | Admitting: Cardiology

## 2017-10-02 ENCOUNTER — Encounter: Payer: Medicaid Other | Admitting: Cardiothoracic Surgery

## 2017-10-02 ENCOUNTER — Ambulatory Visit: Payer: Medicaid Other | Admitting: Cardiology

## 2018-02-05 ENCOUNTER — Other Ambulatory Visit: Payer: Self-pay

## 2018-02-05 ENCOUNTER — Inpatient Hospital Stay (HOSPITAL_COMMUNITY): Payer: Medicaid Other

## 2018-02-05 ENCOUNTER — Inpatient Hospital Stay (HOSPITAL_COMMUNITY)
Admission: AD | Admit: 2018-02-05 | Discharge: 2018-02-06 | DRG: 948 | Disposition: A | Discharge status: Home / Self Care | Payer: Medicaid Other | Attending: Cardiovascular Disease | Admitting: Cardiovascular Disease

## 2018-02-05 DIAGNOSIS — Z8249 Family history of ischemic heart disease and other diseases of the circulatory system: Secondary | ICD-10-CM

## 2018-02-05 DIAGNOSIS — Z8371 Family history of colonic polyps: Secondary | ICD-10-CM | POA: Diagnosis not present

## 2018-02-05 DIAGNOSIS — E669 Obesity, unspecified: Secondary | ICD-10-CM | POA: Diagnosis present

## 2018-02-05 DIAGNOSIS — M797 Fibromyalgia: Secondary | ICD-10-CM | POA: Diagnosis present

## 2018-02-05 DIAGNOSIS — Z6834 Body mass index (BMI) 34.0-34.9, adult: Secondary | ICD-10-CM | POA: Diagnosis not present

## 2018-02-05 DIAGNOSIS — Z79899 Other long term (current) drug therapy: Secondary | ICD-10-CM | POA: Diagnosis not present

## 2018-02-05 DIAGNOSIS — Z9049 Acquired absence of other specified parts of digestive tract: Secondary | ICD-10-CM

## 2018-02-05 DIAGNOSIS — K760 Fatty (change of) liver, not elsewhere classified: Secondary | ICD-10-CM | POA: Diagnosis present

## 2018-02-05 DIAGNOSIS — K219 Gastro-esophageal reflux disease without esophagitis: Secondary | ICD-10-CM | POA: Diagnosis present

## 2018-02-05 DIAGNOSIS — I368 Other nonrheumatic tricuspid valve disorders: Secondary | ICD-10-CM

## 2018-02-05 DIAGNOSIS — I071 Rheumatic tricuspid insufficiency: Secondary | ICD-10-CM | POA: Diagnosis present

## 2018-02-05 DIAGNOSIS — F329 Major depressive disorder, single episode, unspecified: Secondary | ICD-10-CM | POA: Diagnosis present

## 2018-02-05 DIAGNOSIS — Z888 Allergy status to other drugs, medicaments and biological substances status: Secondary | ICD-10-CM | POA: Diagnosis not present

## 2018-02-05 DIAGNOSIS — Z880 Allergy status to penicillin: Secondary | ICD-10-CM | POA: Diagnosis not present

## 2018-02-05 DIAGNOSIS — K589 Irritable bowel syndrome without diarrhea: Secondary | ICD-10-CM | POA: Diagnosis present

## 2018-02-05 DIAGNOSIS — Z885 Allergy status to narcotic agent status: Secondary | ICD-10-CM | POA: Diagnosis not present

## 2018-02-05 DIAGNOSIS — J45909 Unspecified asthma, uncomplicated: Secondary | ICD-10-CM | POA: Diagnosis present

## 2018-02-05 DIAGNOSIS — M7989 Other specified soft tissue disorders: Secondary | ICD-10-CM | POA: Diagnosis not present

## 2018-02-05 DIAGNOSIS — Z881 Allergy status to other antibiotic agents status: Secondary | ICD-10-CM

## 2018-02-05 DIAGNOSIS — Z841 Family history of disorders of kidney and ureter: Secondary | ICD-10-CM

## 2018-02-05 DIAGNOSIS — F17210 Nicotine dependence, cigarettes, uncomplicated: Secondary | ICD-10-CM | POA: Diagnosis present

## 2018-02-05 DIAGNOSIS — Z833 Family history of diabetes mellitus: Secondary | ICD-10-CM | POA: Diagnosis not present

## 2018-02-05 DIAGNOSIS — I509 Heart failure, unspecified: Secondary | ICD-10-CM

## 2018-02-05 DIAGNOSIS — R1900 Intra-abdominal and pelvic swelling, mass and lump, unspecified site: Secondary | ICD-10-CM | POA: Diagnosis present

## 2018-02-05 DIAGNOSIS — I5032 Chronic diastolic (congestive) heart failure: Secondary | ICD-10-CM | POA: Diagnosis present

## 2018-02-05 DIAGNOSIS — F4310 Post-traumatic stress disorder, unspecified: Secondary | ICD-10-CM | POA: Diagnosis present

## 2018-02-05 DIAGNOSIS — M79609 Pain in unspecified limb: Secondary | ICD-10-CM | POA: Diagnosis not present

## 2018-02-05 DIAGNOSIS — R600 Localized edema: Secondary | ICD-10-CM | POA: Diagnosis present

## 2018-02-05 DIAGNOSIS — Z886 Allergy status to analgesic agent status: Secondary | ICD-10-CM

## 2018-02-05 DIAGNOSIS — I079 Rheumatic tricuspid valve disease, unspecified: Secondary | ICD-10-CM | POA: Diagnosis present

## 2018-02-05 LAB — TROPONIN I

## 2018-02-05 LAB — CBC WITH DIFFERENTIAL/PLATELET
ABS IMMATURE GRANULOCYTES: 0.02 10*3/uL (ref 0.00–0.07)
Basophils Absolute: 0 10*3/uL (ref 0.0–0.1)
Basophils Relative: 0 %
Eosinophils Absolute: 0.3 10*3/uL (ref 0.0–0.5)
Eosinophils Relative: 3 %
HCT: 43 % (ref 36.0–46.0)
Hemoglobin: 14.1 g/dL (ref 12.0–15.0)
Immature Granulocytes: 0 %
LYMPHS ABS: 4.2 10*3/uL — AB (ref 0.7–4.0)
LYMPHS PCT: 52 %
MCH: 30.1 pg (ref 26.0–34.0)
MCHC: 32.8 g/dL (ref 30.0–36.0)
MCV: 91.7 fL (ref 80.0–100.0)
Monocytes Absolute: 0.4 10*3/uL (ref 0.1–1.0)
Monocytes Relative: 5 %
NRBC: 0 % (ref 0.0–0.2)
Neutro Abs: 3.3 10*3/uL (ref 1.7–7.7)
Neutrophils Relative %: 40 %
Platelets: 225 10*3/uL (ref 150–400)
RBC: 4.69 MIL/uL (ref 3.87–5.11)
RDW: 12.4 % (ref 11.5–15.5)
WBC: 8.2 10*3/uL (ref 4.0–10.5)

## 2018-02-05 LAB — COMPREHENSIVE METABOLIC PANEL
ALBUMIN: 4.2 g/dL (ref 3.5–5.0)
ALT: 50 U/L — ABNORMAL HIGH (ref 0–44)
AST: 43 U/L — ABNORMAL HIGH (ref 15–41)
Alkaline Phosphatase: 81 U/L (ref 38–126)
Anion gap: 9 (ref 5–15)
BUN: 6 mg/dL (ref 6–20)
CHLORIDE: 104 mmol/L (ref 98–111)
CO2: 25 mmol/L (ref 22–32)
Calcium: 9.2 mg/dL (ref 8.9–10.3)
Creatinine, Ser: 0.68 mg/dL (ref 0.44–1.00)
GFR calc non Af Amer: >60 mL/min (ref >60)
Glucose, Bld: 81 mg/dL (ref 70–99)
Potassium: 3.9 mmol/L (ref 3.5–5.1)
Sodium: 138 mmol/L (ref 135–145)
Total Bilirubin: 0.4 mg/dL (ref 0.3–1.2)
Total Protein: 6.9 g/dL (ref 6.5–8.1)

## 2018-02-05 LAB — BRAIN NATRIURETIC PEPTIDE: B Natriuretic Peptide: 24.7 pg/mL (ref 0.0–100.0)

## 2018-02-05 MED ORDER — HEPARIN SODIUM (PORCINE) 5000 UNIT/ML IJ SOLN
5000.0000 [IU] | Freq: Three times a day (TID) | INTRAMUSCULAR | Status: DC
  Administered 2018-02-05: 5000 [IU] via SUBCUTANEOUS
  Filled 2018-02-05 (×3): qty 1

## 2018-02-05 MED ORDER — SODIUM CHLORIDE 0.9% FLUSH
3.0000 mL | Freq: Two times a day (BID) | INTRAVENOUS | Status: DC
  Administered 2018-02-05 – 2018-02-06 (×2): 3 mL via INTRAVENOUS

## 2018-02-05 MED ORDER — NICOTINE 21 MG/24HR TD PT24
21.0000 mg | MEDICATED_PATCH | Freq: Every day | TRANSDERMAL | Status: DC
  Administered 2018-02-05 – 2018-02-06 (×2): 21 mg via TRANSDERMAL
  Filled 2018-02-05 (×2): qty 1

## 2018-02-05 MED ORDER — ALPRAZOLAM 0.25 MG PO TABS
0.2500 mg | ORAL_TABLET | Freq: Two times a day (BID) | ORAL | Status: DC | PRN
  Administered 2018-02-05 – 2018-02-06 (×2): 0.25 mg via ORAL
  Filled 2018-02-05 (×2): qty 1

## 2018-02-05 MED ORDER — ACETAMINOPHEN 325 MG PO TABS: 650.0000 mg | ORAL_TABLET | ORAL | Status: DC | PRN

## 2018-02-05 MED ORDER — ONDANSETRON HCL 4 MG/2ML IJ SOLN: 4.0000 mg | Freq: Four times a day (QID) | INTRAMUSCULAR | Status: DC | PRN

## 2018-02-05 MED ORDER — SODIUM CHLORIDE 0.9% FLUSH: 3.0000 mL | INTRAVENOUS | Status: DC | PRN

## 2018-02-05 MED ORDER — SODIUM CHLORIDE 0.9% FLUSH: 10.0000 mL | INTRAVENOUS | Status: DC | PRN

## 2018-02-05 MED ORDER — FUROSEMIDE 10 MG/ML IJ SOLN
40.0000 mg | Freq: Two times a day (BID) | INTRAMUSCULAR | Status: DC
  Administered 2018-02-05 – 2018-02-06 (×2): 40 mg via INTRAVENOUS
  Filled 2018-02-05 (×3): qty 4

## 2018-02-05 MED ORDER — SODIUM CHLORIDE 0.9 % IV SOLN: 250.0000 mL | INTRAVENOUS | Status: DC | PRN

## 2018-02-05 NOTE — H&P (Signed)
Referring Physician:  [name] is an 33 y.o. female.                       Chief Complaint: Bilateral leg edema and shortness of breath  HPI: 33 years old female with PMH of TV endocarditis with MSSA, Asthma, IBS, Fibromyalgia, Headache, PTSD and h/o Opioid use, has 2 weeks h/o of leg edema and 10-15 pounds of weight gain and shortness of breath. She has felt occasional chills but no fever. She continues to smoke 1-2 pack of cigarettes per day.  Past Medical History:  Diagnosis Date  . Anemia, unspecified   . Anxiety   . Asthma   . Disc herniation    causes sciatica unsure which disc  . Esophageal reflux   . Fatty liver   . Fibromyalgia   . Gastroparesis   . Headache(784.0)   . Heart murmur   . History of narcotic addiction (HCC) 09/30/2012   2010:  Per pt, was addicted to narcotics; mom helped intervene and stopped taking opiods   . Hyperemesis gravidarum with metabolic disturbance, unspecified as to episode of care   . Irritable bowel syndrome   . MSSA bacteremia 02/20/2017  . Other and unspecified noninfectious gastroenteritis and colitis(558.9)   . Pregnant   . PTSD (post-traumatic stress disorder)   . Septic arthritis of right sternoclavicular joint (HCC) 02/20/2017  . Unspecified asthma(493.90)       Past Surgical History:  Procedure Laterality Date  . APPENDECTOMY    . CHOLECYSTECTOMY    . COLONOSCOPY    . INCISION AND DRAINAGE ABSCESS Left 04/09/2017   Procedure: INCISION AND DRAINAGE ABSCESS OF NECK;  Surgeon: Graylin Shiver, MD;  Location: [facility] OR;  Service: ENT;  Laterality: Left;  . LAPAROSCOPIC BILATERAL SALPINGECTOMY Bilateral 12/08/2012   Procedure: LAPAROSCOPIC BILATERAL SALPINGECTOMY;  Surgeon: Tilda Burrow, MD;  Location: [facility];  Service: Gynecology;  Laterality: Bilateral;  . MULTIPLE EXTRACTIONS WITH ALVEOLOPLASTY N/A 12/01/2015   Procedure: EXTRACTION TEETH TWO, THREE, FOUR, SIX, SEVEN, EIGHT, NINE, TEN, ELEVEN, TWELVE, FOURTEEN, FIFTEEN,  TWENTY, TWENTY ONE, TWENTY EIGHT, TWENTY NINE, THIRTY AND THIRTY ONE WITH ALVEOLOPLASTY;  Surgeon: Ocie Doyne, DDS;  Location: [facility];  Service: Oral Surgery;  Laterality: N/A;  . TEE WITHOUT CARDIOVERSION N/A 02/20/2017   Procedure: TRANSESOPHAGEAL ECHOCARDIOGRAM (TEE);  Surgeon: Laurey Morale, MD;  Location: [facility] ENDOSCOPY;  Service: Cardiovascular;  Laterality: N/A;  . TEE WITHOUT CARDIOVERSION N/A 04/11/2017   Procedure: TRANSESOPHAGEAL ECHOCARDIOGRAM (TEE);  Surgeon: Orpah Cobb, MD;  Location: [facility] ENDOSCOPY;  Service: Cardiovascular;  Laterality: N/A;  . TUBAL LIGATION      Family History  Problem Relation Age of Onset  . Ulcerative colitis Paternal Grandmother   . Cancer Paternal Grandmother   . Colon polyps Paternal Grandfather   . Cancer Paternal Grandfather        thyroid  . Diabetes Father   . Heart disease Father   . Kidney disease Father   . Hypertension Father    Social History:  reports that she has been smoking cigarettes. She has a 15.00 pack-year smoking history. She has never used smokeless tobacco. She reports that she does not drink alcohol or use drugs.  Allergies:  Allergies  Allergen Reactions  . Azithromycin Nausea And Vomiting    ABDOMINAL PAIN  . Nsaids     UNSPECIFIED REACTION    . Hydrocodone Nausea And Vomiting  . Penicillins Nausea And Vomiting     Has  patient had a PCN reaction causing immediate rash, facial/tongue/throat swelling, SOB or lightheadedness with hypotension: No Has patient had a PCN reaction causing severe rash involving mucus membranes or skin necrosis: No Has patient had a PCN reaction that required hospitalization No Has patient had a PCN reaction occurring within the last 10 years: No If all of the above answers are "NO", then may proceed with Cephalosporin use.   . Prednisone Nausea Only and Other (See Comments)    Can take shot, but pill form "caused stomach pain , nausea"    Medications Prior to Admission  Medication Sig  Dispense Refill  . acetaminophen (TYLENOL) 500 MG tablet Take 1,000 mg by mouth every 6 (six) hours as needed for mild pain or moderate pain.    Marland Kitchen. ALPRAZolam (XANAX) 0.5 MG tablet Take 1 tablet (0.5 mg total) by mouth 2 (two) times daily as needed for anxiety. 4 tablet 0  . Buprenorphine HCl-Naloxone HCl (SUBOXONE) 4-1 MG FILM Place 2 Film under the tongue daily.     . cyclobenzaprine (FLEXERIL) 10 MG tablet Take 1 tablet (10 mg total) by mouth 3 (three) times daily. 12 tablet 0  . DULoxetine (CYMBALTA) 60 MG capsule Take 60 mg by mouth 2 (two) times daily.    . ferrous sulfate 325 (65 FE) MG tablet Take 1 tablet (325 mg total) by mouth 3 (three) times daily with meals. 90 tablet 3  . Multiple Vitamin (MULTIVITAMIN WITH MINERALS) TABS tablet Take 1 tablet by mouth daily. ALL NATURAL VITAMIN: Made by [employer] (system six)    . nicotine (NICODERM CQ - DOSED IN MG/24 HR) 7 mg/24hr patch Place 1 patch (7 mg total) onto the skin daily. 28 patch 0  . ondansetron (ZOFRAN) 4 MG tablet Take 1 tablet (4 mg total) by mouth every 6 (six) hours. For nausea or vomiting 12 tablet 0  . vitamin B-12 (CYANOCOBALAMIN) 1000 MCG tablet Take by mouth.      No results found for this or any previous visit (from the past 48 hour(s)). No results found.  Review Of Systems Constitutional: Positive fever, chills, weight loss or gain. Eyes: No vision change, wears glasses. No discharge or pain. Ears: No hearing loss, No tinnitus. Respiratory: No asthma, COPD, pneumonias. Positive shortness of breath. No hemoptysis. Cardiovascular: No chest pain, palpitation, leg edema. Gastrointestinal: Positive nausea, vomiting, diarrhea, no constipation. No GI bleed. No hepatitis. Genitourinary: No dysuria, hematuria, kidney stone. No incontinance. Neurological: Positive headache, no stroke, seizures.  Psychiatry: Positive psych facility admission for anxiety, depression, suicide. Positive detox. Skin: No rash. Musculoskeletal:  Positive joint pain, fibromyalgia, neck pain, back pain. Lymphadenopathy: No lymphadenopathy. Hematology: Positive anemia or easy bruising.   Blood pressure 108/70, pulse 72, temperature 98 F (36.7 C), temperature source Oral, resp. rate 20, height 5\' 7"  (1.702 m), weight 103.2 kg, SpO2 100 %. Body mass index is 35.63 kg/m. General appearance: alert, cooperative, appears stated age and no distress Head: Normocephalic, atraumatic. Eyes: Blue eyes, pink conjunctiva, corneas clear. PERRL, EOM's intact. Neck: No adenopathy, no carotid bruit, no JVD, supple, symmetrical, trachea midline and thyroid not enlarged. Resp: Clearing to auscultation bilaterally. Cardio: Regular rate and rhythm, S1, S2 normal, III/VI systolic murmur over left sternal border without apical murmur, no click, rub or gallop GI: Soft, non-tender; bowel sounds normal; no organomegaly. Extremities: 2+ edema of lower legs, cyanosis or clubbing. Skin: Warm and dry.  Neurologic: Alert and oriented X 3, normal strength. Normal coordination and slow gait.  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 Blood cultures, Troponin I, BNP. Echocardiogram.   Ricki Rodriguez, MD  02/05/2018, 6:03 PM

## 2018-02-05 NOTE — Plan of Care (Signed)
  Problem: Education: Goal: Knowledge of General Education information will improve Description Including pain rating scale, medication(s)/side effects and non-pharmacologic comfort measures Outcome: Not Met (add Reason)   Problem: Health Behavior/Discharge Planning: Goal: Ability to manage health-related needs will improve Outcome: Not Met (add Reason)   Problem: Clinical Measurements: Goal: Ability to maintain clinical measurements within normal limits will improve Outcome: Not Met (add Reason) Goal: Will remain free from infection Outcome: Not Met (add Reason) Goal: Diagnostic test results will improve Outcome: Not Met (add Reason) Goal: Respiratory complications will improve Outcome: Not Met (add Reason) Goal: Cardiovascular complication will be avoided Outcome: Not Met (add Reason)   Problem: Activity: Goal: Risk for activity intolerance will decrease Outcome: Not Met (add Reason)   Problem: Nutrition: Goal: Adequate nutrition will be maintained Outcome: Not Met (add Reason)   Problem: Coping: Goal: Level of anxiety will decrease Outcome: Not Met (add Reason)   Problem: Elimination: Goal: Will not experience complications related to bowel motility Outcome: Not Met (add Reason) Goal: Will not experience complications related to urinary retention Outcome: Not Met (add Reason)   Problem: Pain Managment: Goal: General experience of comfort will improve Outcome: Not Met (add Reason)   Problem: Safety: Goal: Ability to remain free from injury will improve Outcome: Not Met (add Reason)   Problem: Skin Integrity: Goal: Risk for impaired skin integrity will decrease Outcome: Not Met (add Reason)   Problem: Education: Goal: Ability to demonstrate management of disease process will improve Outcome: Not Met (add Reason) Goal: Ability to verbalize understanding of medication therapies will improve Outcome: Not Met (add Reason) Goal: Individualized Educational  Video(s) Outcome: Not Met (add Reason)   Problem: Activity: Goal: Capacity to carry out activities will improve Outcome: Not Met (add Reason)   Problem: Cardiac: Goal: Ability to achieve and maintain adequate cardiopulmonary perfusion will improve Outcome: Not Met (add Reason)

## 2018-02-05 NOTE — Progress Notes (Addendum)
Pt informed RN that she had brought home medications with her to hospital. Pt told RN that someone is coming to pick them up and bring them home.  Medications pt brought with her that will be taken home: iron and cymbalta

## 2018-02-05 NOTE — Plan of Care (Signed)
  Problem: Education: Goal: Knowledge of General Education information will improve Description Including pain rating scale, medication(s)/side effects and non-pharmacologic comfort measures Outcome: Progressing   Problem: Health Behavior/Discharge Planning: Goal: Ability to manage health-related needs will improve Outcome: Progressing   Problem: Clinical Measurements: Goal: Ability to maintain clinical measurements within normal limits will improve Outcome: Progressing   Problem: Activity: Goal: Risk for activity intolerance will decrease Outcome: Progressing   Problem: Education: Goal: Ability to demonstrate management of disease process will improve Outcome: Progressing

## 2018-02-05 NOTE — Progress Notes (Signed)
Provider wants to wait on the pain medication because of the drowsiness. Will continue to monitor pt.

## 2018-02-05 NOTE — Progress Notes (Signed)
Pt has arrived on the unit. RN called MD for orders. On call RN states she will notify MD.

## 2018-02-05 NOTE — Progress Notes (Signed)
IV team states pt will need midline. RN unable to give scheduled lasix at this time. Night shift RN notified.

## 2018-02-05 NOTE — Progress Notes (Signed)
Rn did shift assessment at [id number]. Pt was alert and oriented. Pt called RN for pain medications. Rn paged provider. RN went back to room to find patient more drowsy and less alert then before. Pt states she is just tired.

## 2018-02-06 ENCOUNTER — Ambulatory Visit (HOSPITAL_COMMUNITY): Payer: Medicaid Other

## 2018-02-06 ENCOUNTER — Inpatient Hospital Stay (HOSPITAL_COMMUNITY): Payer: Medicaid Other

## 2018-02-06 DIAGNOSIS — M79609 Pain in unspecified limb: Secondary | ICD-10-CM

## 2018-02-06 DIAGNOSIS — M7989 Other specified soft tissue disorders: Secondary | ICD-10-CM

## 2018-02-06 LAB — URINALYSIS, ROUTINE W REFLEX MICROSCOPIC
Bilirubin Urine: NEGATIVE
Glucose, UA: NEGATIVE mg/dL
Hgb urine dipstick: NEGATIVE
KETONES UR: NEGATIVE mg/dL
Leukocytes, UA: NEGATIVE
Nitrite: NEGATIVE
Protein, ur: NEGATIVE mg/dL
Specific Gravity, Urine: 1.006 (ref 1.005–1.030)
pH: 5 (ref 5.0–8.0)

## 2018-02-06 LAB — TROPONIN I
Troponin I: <0.03 ng/mL (ref <0.03)
Troponin I: <0.03 ng/mL (ref <0.03)

## 2018-02-06 LAB — BASIC METABOLIC PANEL
ANION GAP: 10 (ref 5–15)
BUN: 7 mg/dL (ref 6–20)
CO2: 25 mmol/L (ref 22–32)
Calcium: 9.4 mg/dL (ref 8.9–10.3)
Chloride: 104 mmol/L (ref 98–111)
Creatinine, Ser: 0.68 mg/dL (ref 0.44–1.00)
GFR calc Af Amer: >60 mL/min (ref >60)
GFR calc non Af Amer: >60 mL/min (ref >60)
Glucose, Bld: 92 mg/dL (ref 70–99)
Potassium: 3.8 mmol/L (ref 3.5–5.1)
Sodium: 139 mmol/L (ref 135–145)

## 2018-02-06 LAB — RAPID URINE DRUG SCREEN, HOSP PERFORMED
Amphetamines: NOT DETECTED
Barbiturates: NOT DETECTED
Benzodiazepines: POSITIVE — AB
Cocaine: NOT DETECTED
Opiates: NOT DETECTED
TETRAHYDROCANNABINOL: POSITIVE — AB

## 2018-02-06 LAB — ECHOCARDIOGRAM COMPLETE
HEIGHTINCHES: 67 in
WEIGHTICAEL: 3561.6 [oz_av]

## 2018-02-06 MED ORDER — NICOTINE 21 MG/24HR TD PT24: 21.0000 mg | MEDICATED_PATCH | Freq: Every day | TRANSDERMAL | 1 refills | Status: DC

## 2018-02-06 MED ORDER — FERROUS SULFATE 325 (65 FE) MG PO TABS
325.0000 mg | ORAL_TABLET | Freq: Three times a day (TID) | ORAL | Status: DC
  Administered 2018-02-06: 325 mg via ORAL
  Filled 2018-02-06: qty 1

## 2018-02-06 MED ORDER — IOHEXOL 300 MG/ML  SOLN
100.0000 mL | Freq: Once | INTRAMUSCULAR | Status: AC | PRN
  Administered 2018-02-06: 100 mL via INTRAVENOUS

## 2018-02-06 MED ORDER — ADULT MULTIVITAMIN W/MINERALS CH: 1.0000 | ORAL_TABLET | Freq: Every day | ORAL | Status: DC

## 2018-02-06 MED ORDER — HYDROXYZINE HCL 25 MG PO TABS: 25.0000 mg | ORAL_TABLET | Freq: Two times a day (BID) | ORAL | Status: DC | PRN

## 2018-02-06 MED ORDER — DULOXETINE HCL 30 MG PO CPEP: 30.0000 mg | ORAL_CAPSULE | Freq: Two times a day (BID) | ORAL | Status: DC

## 2018-02-06 NOTE — Progress Notes (Signed)
2D Echocardiogram has been performed.  [name] 02/06/2018, 11:07 AM

## 2018-02-06 NOTE — Progress Notes (Signed)
Pt still really drowsy. BP is 82/47. MD notified

## 2018-02-06 NOTE — Progress Notes (Signed)
Ref: Patient, No Pcp Per   Subjective:  Feeling better post oral fluids as IV was down overnight. Abdominal swelling, leg edema and pain continues.  Echocardiogram with preserved LV systolic function, moderate to severe RV and RA enlargement with mild systolic dysfunction and moderate TR. No pericardial effusion or definite TV vegetation. No fever. Normal WBC count. Cultures are pending. She is not a candidate for surgical interventions of TV until off drugs. Patient aware of giving up drugs since last May, 2019. IV lasix did not work.  Will check CT abdomen and Pelvis and [place] both legs to r/o DVT.  Objective:  Vital Signs in the last 24 hours: Temp:  [97.8 F (36.6 C)-98.4 F (36.9 C)] 98.4 F (36.9 C) (01/24 1205) Pulse Rate:  [59-75] 75 (01/24 1205) Cardiac Rhythm: Normal sinus rhythm (01/24 0700) Resp:  [18-20] 18 (01/24 1205) BP: (82-108)/(47-84) 106/84 (01/24 1205) SpO2:  [92 %-100 %] 97 % (01/24 1205) Weight:  [101 kg-103.2 kg] 101 kg (01/24 0537)  Physical Exam: BP Readings from Last 1 Encounters:  02/06/18 106/84     Wt Readings from Last 1 Encounters:  02/06/18 101 kg    Weight change:  Body mass index is 34.86 kg/m. HEENT: [place]/AT, Eyes-Blue, PERL, EOMI, Conjunctiva-Pink, Sclera-Non-icteric Neck: No JVD, No bruit, Trachea midline. Lungs:  Clear, Bilateral. Cardiac:  Regular rhythm, normal S1 and S2, no S3. III/VI systolic murmur. Abdomen:  Soft, distended, non-tender. BS present. Extremities:  2 + leg edema present. No cyanosis. No clubbing. Bilateral lower leg tenderness. CNS: AxOx3, Cranial nerves grossly intact, moves all 4 extremities.  Skin: Warm and dry.   Intake/Output from previous day: 01/23 0701 - 01/24 0700 In: 723 [P.O.:720; I.V.:3] Out: 550 [Urine:550]    Lab Results: BMET    Component Value Date/Time   NA 139 02/06/2018 0745   NA 138 02/05/2018 1828   NA 140 05/01/2017 2257   K 3.8 02/06/2018 0745   K 3.9 02/05/2018 1828   K 3.6  05/01/2017 2257   CL 104 02/06/2018 0745   CL 104 02/05/2018 1828   CL 105 05/01/2017 2257   CO2 25 02/06/2018 0745   CO2 25 02/05/2018 1828   CO2 23 05/01/2017 2257   GLUCOSE 92 02/06/2018 0745   GLUCOSE 81 02/05/2018 1828   GLUCOSE 104 (H) 05/01/2017 2257   BUN 7 02/06/2018 0745   BUN 6 02/05/2018 1828   BUN 7 05/01/2017 2257   CREATININE 0.68 02/06/2018 0745   CREATININE 0.68 02/05/2018 1828   CREATININE 0.62 05/01/2017 2257   CALCIUM 9.4 02/06/2018 0745   CALCIUM 9.2 02/05/2018 1828   CALCIUM 9.5 05/01/2017 2257   GFRNONAA >60 02/06/2018 0745   GFRNONAA >60 02/05/2018 1828   GFRNONAA >60 05/01/2017 2257   GFRAA >60 02/06/2018 0745   GFRAA >60 02/05/2018 1828   GFRAA >60 05/01/2017 2257   CBC    Component Value Date/Time   WBC 8.2 02/05/2018 1828   RBC 4.69 02/05/2018 1828   HGB 14.1 02/05/2018 1828   HCT 43.0 02/05/2018 1828   PLT 225 02/05/2018 1828   MCV 91.7 02/05/2018 1828   MCH 30.1 02/05/2018 1828   MCHC 32.8 02/05/2018 1828   RDW 12.4 02/05/2018 1828   LYMPHSABS 4.2 (H) 02/05/2018 1828   MONOABS 0.4 02/05/2018 1828   EOSABS 0.3 02/05/2018 1828   BASOSABS 0.0 02/05/2018 1828   HEPATIC Function Panel Recent Labs    02/18/17 0503 03/22/17 0224 02/05/18 1828  PROT 6.1* 5.8* 6.9  HEMOGLOBIN A1C No components found for: HGA1C,  MPG CARDIAC ENZYMES Lab Results  Component Value Date   TROPONINI <0.03 02/06/2018   TROPONINI <0.03 02/06/2018   TROPONINI <0.03 02/05/2018   BNP No results for input(s): PROBNP in the last 8760 hours. TSH No results for input(s): TSH in the last 8760 hours. CHOLESTEROL No results for input(s): CHOL in the last 8760 hours.  Scheduled Meds: . DULoxetine  30 mg Oral BID  . ferrous sulfate  325 mg Oral TID WC  . heparin  5,000 Units Subcutaneous Q8H  . multivitamin with minerals  1 tablet Oral Daily  . nicotine  21 mg Transdermal Daily  . sodium chloride flush  3 mL Intravenous Q12H   Continuous Infusions: .  sodium chloride     PRN Meds:.sodium chloride, acetaminophen, ALPRAZolam, hydrOXYzine, ondansetron (ZOFRAN) IV, sodium chloride flush, sodium chloride flush  Assessment/Plan: Bilateral lower leg edema,r/o DVT Asthma Tobacco use disorder Moderate TR Moderate RV dilatation with mild systolic dysfunction PTSD Depression Drug abuse history  CT abdomen and pelvis. Doppler lower extremities. Patient reminded to give up drugs including Suboxone and smoking. Continue Cymbalta Add Nicotine patch.   LOS: 1 day    Orpah Cobb  MD  02/06/2018, 1:21 PM

## 2018-02-06 NOTE — Progress Notes (Signed)
Bilateral lower extremity venous duplex has been completed. Preliminary results can be found in CV Proc through chart review.   02/06/18 1:32 PM Olen CordialGreg Javione Gunawan RVT

## 2018-02-06 NOTE — Discharge Summary (Signed)
Physician Discharge Summary  Patient ID: [name] MRN: [id number] DOB/AGE: 34/04/1985 34 y.o.  Admit date: 02/05/2018 Discharge date: 02/06/2018  Admission Diagnoses: Bilateral leg edema r/o CHF Shortness of breath Asthma Tobacco use disorder Severe TR, r/o endocarditis PTSD Depression  Discharge Diagnoses:  Principal Problem:   Bilateral leg edema, DVT unlikely Active Problems:   Irritable bowel syndrome   Fibromyalgia   H/O Endocarditis of tricuspid valve   Moderate TR   H/O diastolic CHF (congestive heart failure) (HCC)   Mild RV systolic dysfunction   Moderate RV dilatation   Asthma   Obesity   PTSD   Depression   Tobacco use disorder   Suboxone use disorder    Discharged Condition: fair  Hospital Course: 33 years old female with PMH of TV endocarditis with MSSA, Asthma, IBS, Fibromyalgia, PTSD, H/O Opioid use had 2 weeks of leg edema and 10-15 pounds of weight gain. Her CBC and BMET were unremarkable. Blood culture results are pending completion of 5 days. Her echocardiogram showed lesser TV regurgitation but moderately dilated RV with mild RV systolic dysfunction. Her CT abdomen and Pelvis was also unremarkable and Lower extremities doppler was negative for DVT.  On further questioning patient admitted to using excess liquid diet to lose weight. She was reminded to decrease smoking to quit as soon as possible with nicotine patch use. She agrees to decrease Suboxone use voluntarily and go off over few weeks time. She will follow life-style modification, increase activity and avoid excess fluid intake. She will see me in 1 month and see primary care in [place] as soon as possible.   Consults: cardiology  Significant Diagnostic Studies: labs: Normal BMET, normal CBC, normal BNP and normal Troponin I. Urine drug screen positive for Benzodiazepines and Tetrahydrocannabinols.  Blood cultures final results are pending 5 days of incubation.   EKG: NSR with sinus  arrhythmia.  Chest x-ray: Unremarkable.  CT abdomen and Pelvis : Unremarkable.  Echocardiogram : Mild LV and RV systolic dysfunction, EF 45-50 %. Moderate RV dilatation and moderate TV regurgitation. No definite vegetation seen on TV.  Treatments: Home medications but decrease cigarette smoking to quit ASAP, decrease Suboxone use by 2 mg. Every 3 days to quit in 1-2 months.  Discharge Exam: Blood pressure 110/80, pulse 72, temperature 98.6 F (37 C), temperature source Oral, resp. rate 19, height 5\' 7"  (1.702 m), weight 101 kg, last menstrual period 01/21/2018, SpO2 98 %. General appearance: alert, cooperative and appears stated age. Head: Normocephalic, atraumatic. Eyes: Blue eyes, pink conjunctiva, corneas clear. PERRL, EOM's intact.  Neck: No adenopathy, no carotid bruit, no JVD, supple, symmetrical, trachea midline and thyroid not enlarged. Resp: Clearing to auscultation bilaterally. Cardio: Regular rate and rhythm, S1, S2 normal, III/VI systolic murmur, no click, rub or gallop. GI: Soft, non-tender; bowel sounds normal; no organomegaly. Extremities: 2 + edema, cyanosis or clubbing. Skin: Warm and dry.  Neurologic: Alert and oriented X 3, normal strength and tone. Normal coordination and slow gait.  Disposition: Discharge disposition: 01-Home or Self Care        Allergies as of 02/06/2018      Reactions   Azithromycin Nausea And Vomiting   ABDOMINAL PAIN   Latex Itching   Nsaids    UNSPECIFIED REACTION    Hydrocodone Nausea And Vomiting   Penicillins Nausea And Vomiting   Has patient had a PCN reaction causing immediate rash, facial/tongue/throat swelling, SOB or lightheadedness with hypotension: No Has patient had a PCN reaction causing severe rash  involving mucus membranes or skin necrosis: No Has patient had a PCN reaction that required hospitalization No Has patient had a PCN reaction occurring within the last 10 years: No If all of the above answers are "NO", then  may proceed with Cephalosporin use.   Prednisone Nausea Only, Other (See Comments)   Can take shot, but pill form "caused stomach pain , nausea"      Medication List    STOP taking these medications   acetaminophen 500 MG tablet Commonly known as:  TYLENOL   ALPRAZolam 0.5 MG tablet Commonly known as:  XANAX   nicotine 7 mg/24hr patch Commonly known as:  NICODERM CQ - dosed in mg/24 hr Replaced by:  nicotine 21 mg/24hr patch     TAKE these medications   DULoxetine HCl 30 MG Csdr Take 30 mg by mouth 2 (two) times daily.   ferrous sulfate 325 (65 FE) MG tablet Take 1 tablet (325 mg total) by mouth 3 (three) times daily with meals.   hydrALAZINE 25 MG tablet Commonly known as:  APRESOLINE Take 25 mg by mouth at bedtime.   hydrOXYzine 25 MG tablet Commonly known as:  ATARAX/VISTARIL Take 25 mg by mouth 2 (two) times daily as needed for anxiety.   multivitamin with minerals Tabs tablet Take 1 tablet by mouth daily. ALL NATURAL VITAMIN: Made by [employer] (system six)   nicotine 21 mg/24hr patch Commonly known as:  NICODERM CQ - dosed in mg/24 hours Place 1 patch (21 mg total) onto the skin daily. Start taking on:  February 07, 2018 Replaces:  nicotine 7 mg/24hr patch   prazosin 1 MG capsule Commonly known as:  MINIPRESS Take 1 mg by mouth at bedtime.   SUBOXONE 8-2 MG Film Generic drug:  Buprenorphine HCl-Naloxone HCl Place 1.5 Film under the tongue daily.   vitamin B-12 1000 MCG tablet Commonly known as:  CYANOCOBALAMIN Take by mouth.      Follow-up Information    Orpah CobbKadakia, Stanislav Gervase, MD. Schedule an appointment as soon as possible for a visit in 1 month(s).   Specialty:  Cardiology Contact information: [address] [place] [address] [phone]           Signed: Ricki Rodriguezjay S Nacole Fluhr 02/06/2018, 5:47 PM

## 2018-02-06 NOTE — Care Management Note (Signed)
Case Management Note  Patient Details  Name: [name] MRN: [id number] Date of Birth: 21-Jan-1985  Subjective/Objective:    Bilateral Leg Edema               Action/Plan: Patient lives at home with her children; PCP - [facility] in [place]; has private insurance with Medicaid with prescription drug coverage; pharmacy of choice is [place]; CM talked to patient about substance abuse, she does not want to talk to the SW at this time; CM will continue to follow for progression of care.  Expected Discharge Date:    possibly 02/10/2018              Expected Discharge Plan:  Home/Self Care  Discharge planning Services  CM Consult  Status of Service:  In process, will continue to follow  Reola Mosher [phone] 02/06/2018, 10:42 AM

## 2018-02-10 LAB — CULTURE, BLOOD (ROUTINE X 2)
Culture: NO GROWTH
Special Requests: ADEQUATE

## 2018-11-10 ENCOUNTER — Other Ambulatory Visit: Payer: Self-pay

## 2018-11-10 DIAGNOSIS — Z20822 Contact with and (suspected) exposure to covid-19: Secondary | ICD-10-CM

## 2018-11-11 ENCOUNTER — Telehealth: Payer: Self-pay

## 2018-11-11 NOTE — Telephone Encounter (Signed)
Patient called and she was told her COVID-19 test result was still pending.  She will call tomorrow

## 2018-11-12 ENCOUNTER — Telehealth: Payer: Self-pay | Admitting: *Deleted

## 2018-11-12 LAB — NOVEL CORONAVIRUS, NAA: SARS-CoV-2, NAA: NOT DETECTED

## 2018-11-12 NOTE — Telephone Encounter (Signed)
Pt called to check on COVID-19 test result.    I let her know they were not back yet.

## 2018-11-12 NOTE — Telephone Encounter (Signed)
Patient informed of negative covid result.  °

## 2018-11-19 ENCOUNTER — Other Ambulatory Visit: Payer: Self-pay | Admitting: *Deleted

## 2018-11-19 DIAGNOSIS — Z20822 Contact with and (suspected) exposure to covid-19: Secondary | ICD-10-CM

## 2018-11-20 ENCOUNTER — Ambulatory Visit: Payer: Self-pay

## 2018-11-20 LAB — NOVEL CORONAVIRUS, NAA: SARS-CoV-2, NAA: NOT DETECTED

## 2018-11-23 NOTE — Telephone Encounter (Signed)
Provided covid test results

## 2019-03-17 IMAGING — CR DG CHEST 2V
2 series · 2 of 2 positions shown · non-contrast
Comparison: 12/19/2014

CLINICAL DATA: Pain

EXAM:
CHEST  2 VIEW

[chest lat]
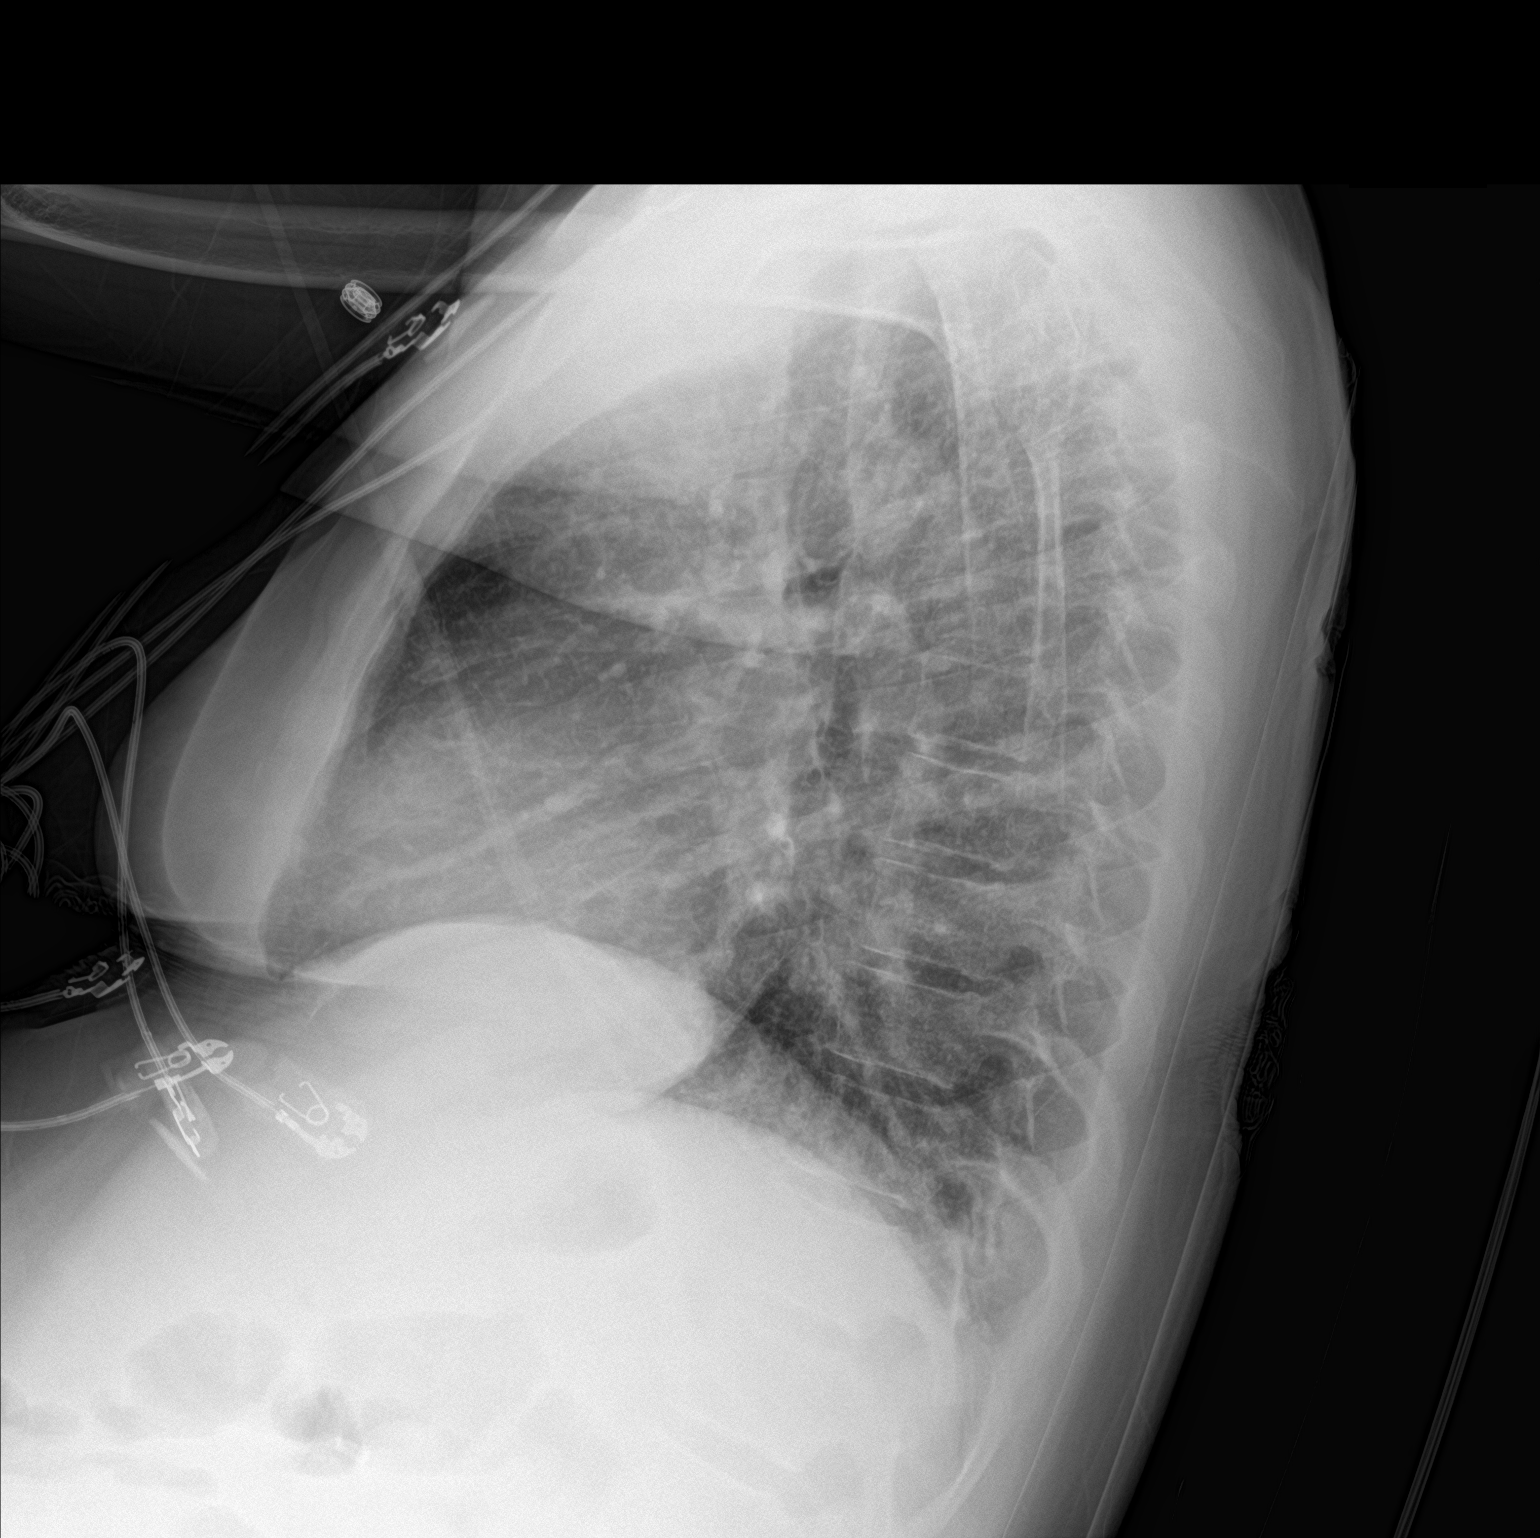

[chest ap]
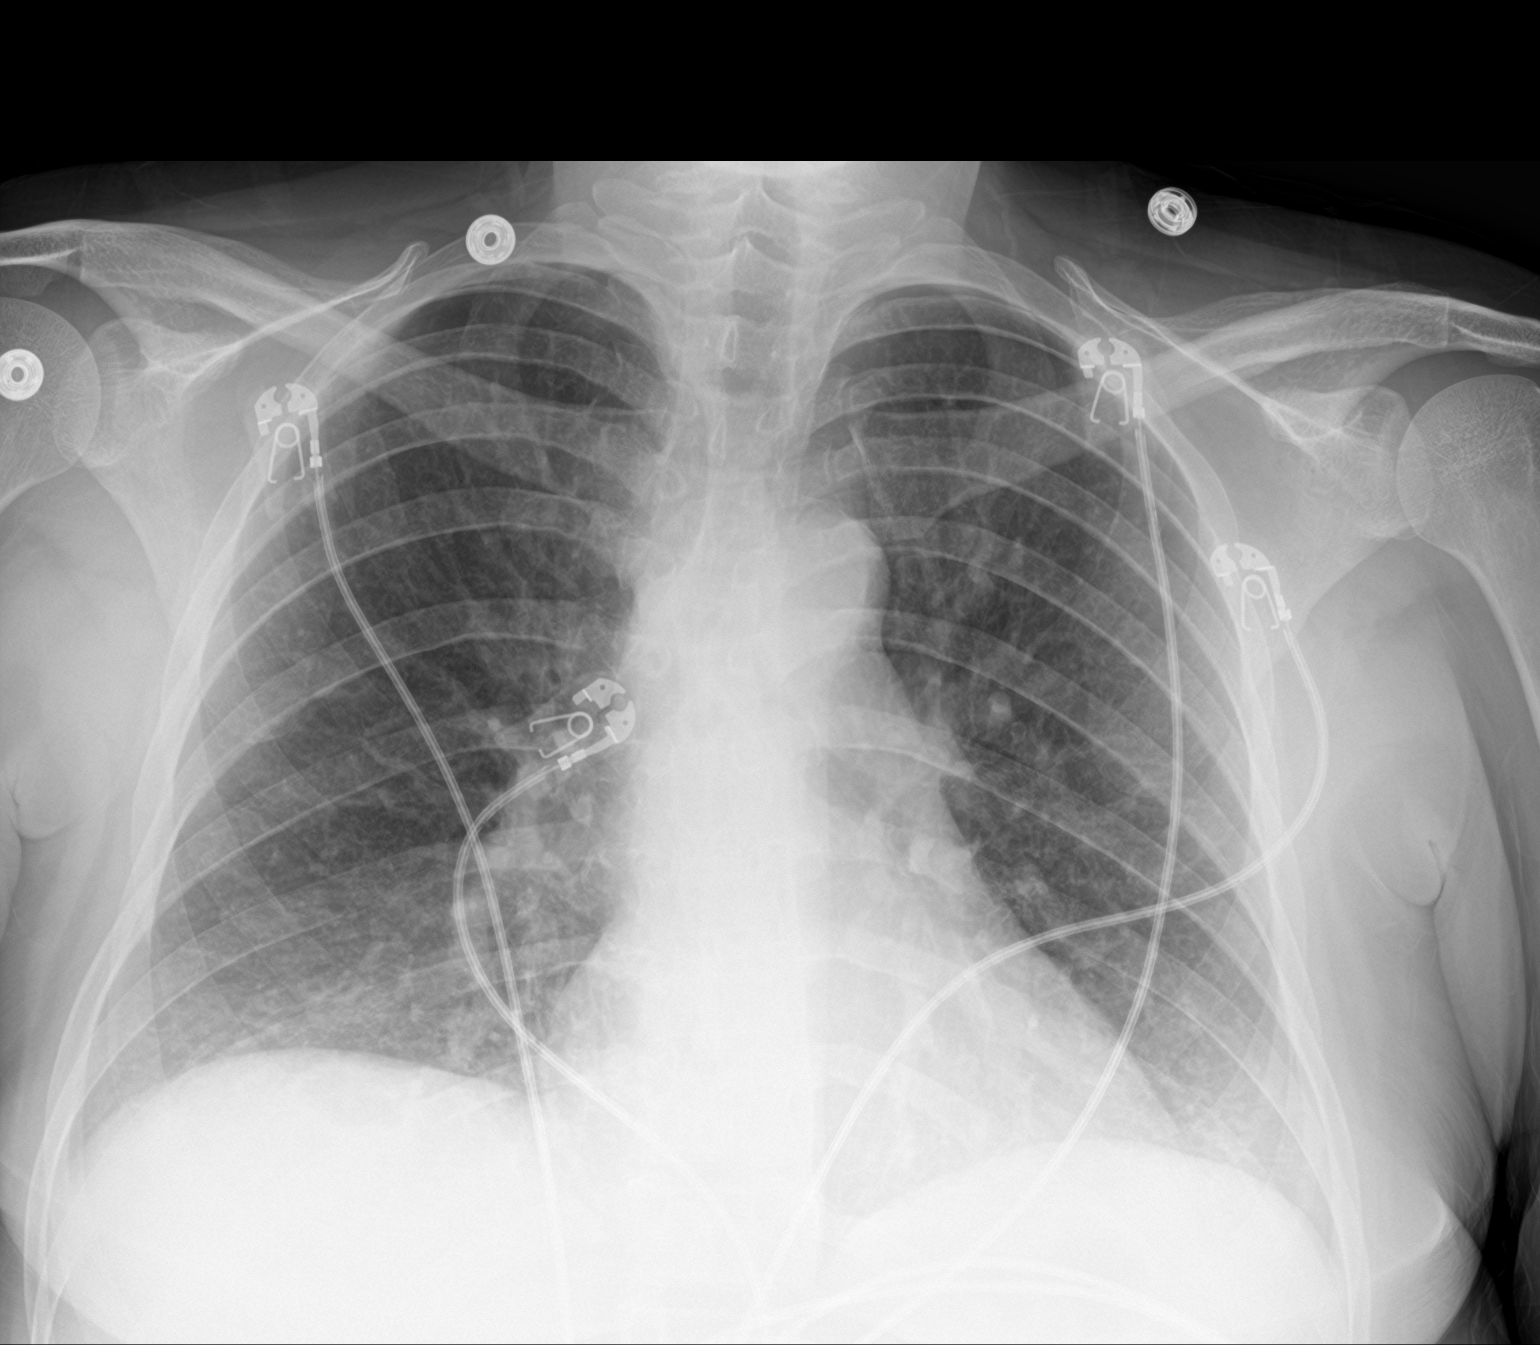

[2 of 2 positions shown; findings below may reference images not displayed]

FINDINGS: Bibasilar opacities, favor atelectasis. Heart is normal size. No
effusions or acute bony abnormality.
IMPRESSION: Bibasilar opacities, likely atelectasis.

## 2019-04-16 ENCOUNTER — Other Ambulatory Visit: Payer: Self-pay

## 2019-04-16 ENCOUNTER — Ambulatory Visit
Admission: EM | Admit: 2019-04-16 | Discharge: 2019-04-16 | Disposition: A | Discharge status: Home / Self Care | Payer: Medicaid Other | Attending: Emergency Medicine | Admitting: Emergency Medicine

## 2019-04-16 DIAGNOSIS — M5442 Lumbago with sciatica, left side: Secondary | ICD-10-CM

## 2019-04-16 DIAGNOSIS — M62838 Other muscle spasm: Secondary | ICD-10-CM

## 2019-04-16 MED ORDER — DEXAMETHASONE SODIUM PHOSPHATE 10 MG/ML IJ SOLN
10.0000 mg | Freq: Once | INTRAMUSCULAR | Status: AC
  Administered 2019-04-16: 16:00:00 10 mg via INTRAMUSCULAR

## 2019-04-16 MED ORDER — CYCLOBENZAPRINE HCL 10 MG PO TABS: 10.0000 mg | ORAL_TABLET | Freq: Every day | ORAL | 0 refills | Status: DC

## 2019-04-16 NOTE — Discharge Instructions (Signed)
Continue conservative management of rest, ice, heat, and gentle stretches/ massage Decadron shot given in office Take cyclobenzaprine at nighttime for symptomatic relief. Avoid driving or operating heavy machinery while using medication. Follow up with PCP if symptoms persist Return or go to the ER if you have any new or worsening symptoms (fever, chills, chest pain, abdominal pain, changes in bowel or bladder habits, pain radiating into lower legs, etc...)

## 2019-04-16 NOTE — ED Provider Notes (Signed)
[facility]   [id number] 04/16/19 Arrival Time: 1600  CC: Back PAIN  SUBJECTIVE: History from: patient. [name] is a 34 y.o. female complains of LT low back x 1-2 days.  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  Localizes the pain to the LT low back.  Describes the pain as intermittent and "contaction" like in character.  Has tried OTC medications without relief.  Symptoms are made worse with ROM and to the touch.  Reports similar symptoms in the past and treated with steroid shot.  Denies fever, chills, erythema, ecchymosis, effusion, weakness, numbness and tingling, saddle paresthesias, loss of bowel or bladder function.      ROS: As per HPI.  All other pertinent ROS negative.     Past Medical History:  Diagnosis Date  . Anemia, unspecified   . Anxiety   . Asthma   . Disc herniation    causes sciatica unsure which disc  . Esophageal reflux   . Fatty liver   . Fibromyalgia   . Gastroparesis   . Headache(784.0)   . Heart murmur   . History of narcotic addiction (HCC) 09/30/2012   2010:  Per pt, was addicted to narcotics; mom helped intervene and stopped taking opiods   . Hyperemesis gravidarum with metabolic disturbance, unspecified as to episode of care   . Irritable bowel syndrome   . MSSA bacteremia 02/20/2017  . Other and unspecified noninfectious gastroenteritis and colitis(558.9)   . Pregnant   . PTSD (post-traumatic stress disorder)   . Septic arthritis of right sternoclavicular joint (HCC) 02/20/2017  . Unspecified asthma(493.90)    Past Surgical History:  Procedure Laterality Date  . APPENDECTOMY    . CHOLECYSTECTOMY    . COLONOSCOPY    . INCISION AND DRAINAGE ABSCESS Left 04/09/2017   Procedure: INCISION AND DRAINAGE ABSCESS OF NECK;  Surgeon: Graylin Shiver, MD;  Location[facility];  Service: ENT;  Laterality: Left;  . LAPAROSCOPIC BILATERAL SALPINGECTOMY Bilateral 12/08/2012   Procedure: LAPAROSCOPIC BILATERAL SALPINGECTOMY;   Surgeon: Tilda Burrow, MD;  Location: [facility] ORS;  Service: Gynecology;  Laterality: Bilateral;  . MULTIPLE EXTRACTIONS WITH ALVEOLOPLASTY N/A 12/01/2015   Procedure: EXTRACTION TEETH TWO, THREE, FOUR, SIX, SEVEN, EIGHT, NINE, TEN, ELEVEN, TWELVE, FOURTEEN, FIFTEEN, TWENTY, TWENTY ONE, TWENTY EIGHT, TWENTY NINE, THIRTY AND THIRTY ONE WITH ALVEOLOPLASTY;  Surgeon: Ocie Doyne, DDS;  Location: [facility] OR;  Service: Oral Surgery;  Laterality: N/A;  . TEE WITHOUT CARDIOVERSION N/A 02/20/2017   Procedure: TRANSESOPHAGEAL ECHOCARDIOGRAM (TEE);  Surgeon: Laurey Morale, MD;  Location: [facility] ENDOSCOPY;  Service: Cardiovascular;  Laterality: N/A;  . TEE WITHOUT CARDIOVERSION N/A 04/11/2017   Procedure: TRANSESOPHAGEAL ECHOCARDIOGRAM (TEE);  Surgeon: Orpah Cobb, MD;  Location: [facility] ENDOSCOPY;  Service: Cardiovascular;  Laterality: N/A;  . TUBAL LIGATION     Allergies  Allergen Reactions  . Azithromycin Nausea And Vomiting    ABDOMINAL PAIN  . Latex Itching  . Nsaids     UNSPECIFIED REACTION    . Hydrocodone Nausea And Vomiting  . Penicillins Nausea And Vomiting     Has patient had a PCN reaction causing immediate rash, facial/tongue/throat swelling, SOB or lightheadedness with hypotension: No Has patient had a PCN reaction causing severe rash involving mucus membranes or skin necrosis: No Has patient had a PCN reaction that required hospitalization No Has patient had a PCN reaction occurring within the last 10 years: No If all of the above answers are "NO", then may proceed with Cephalosporin  use.   . Prednisone Nausea Only and Other (See Comments)    Can take shot, but pill form "caused stomach pain , nausea"   No current facility-administered medications on file prior to encounter.   Current Outpatient Medications on File Prior to Encounter  Medication Sig Dispense Refill  . Buprenorphine HCl-Naloxone HCl (SUBOXONE) 8-2 MG FILM Place 1.5 Film under the tongue daily.     . DULoxetine HCl 30 MG CSDR  Take 30 mg by mouth 2 (two) times daily.     . ferrous sulfate 325 (65 FE) MG tablet Take 1 tablet (325 mg total) by mouth 3 (three) times daily with meals. 90 tablet 3  . hydrALAZINE (APRESOLINE) 25 MG tablet Take 25 mg by mouth at bedtime.    . hydrOXYzine (ATARAX/VISTARIL) 25 MG tablet Take 25 mg by mouth 2 (two) times daily as needed for anxiety.    . Multiple Vitamin (MULTIVITAMIN WITH MINERALS) TABS tablet Take 1 tablet by mouth daily. ALL NATURAL VITAMIN: Made by [employer] (system six)    . nicotine (NICODERM CQ - DOSED IN MG/24 HOURS) 21 mg/24hr patch Place 1 patch (21 mg total) onto the skin daily. 28 patch 1  . prazosin (MINIPRESS) 1 MG capsule Take 1 mg by mouth at bedtime.     . vitamin B-12 (CYANOCOBALAMIN) 1000 MCG tablet Take by mouth.     Social History   Socioeconomic History  . Marital status: Single    Spouse name: Not on file  . Number of children: 2  . Years of education: Not on file  . Highest education level: Not on file  Occupational History  . Occupation: CNA  Tobacco Use  . Smoking status: Current Every Day Smoker    Packs/day: 1.50    Years: 10.00    Pack years: 15.00    Types: Cigarettes  . Smokeless tobacco: Never Used  Substance and Sexual Activity  . Alcohol use: No  . Drug use: No    Types: IV    Comment: pt denies 11/30/15  . Sexual activity: Never    Birth control/protection: Surgical  Other Topics Concern  . Not on file  Social History Narrative   Daily caffeine    Social Determinants of Health   Financial Resource Strain:   . Difficulty of Paying Living Expenses:   Food Insecurity:   . Worried About Programme researcher, broadcasting/film/video in the Last Year:   . Barista in the Last Year:   Transportation Needs:   . Freight forwarder (Medical):   Marland Kitchen Lack of Transportation (Non-Medical):   Physical Activity:   . Days of Exercise per Week:   . Minutes of Exercise per Session:   Stress:   . Feeling of Stress :   Social Connections:   .  Frequency of Communication with Friends and Family:   . Frequency of Social Gatherings with Friends and Family:   . Attends Religious Services:   . Active Member of Clubs or Organizations:   . Attends Banker Meetings:   Marland Kitchen Marital Status:   Intimate Partner Violence:   . Fear of Current or Ex-Partner:   . Emotionally Abused:   Marland Kitchen Physically Abused:   . Sexually Abused:    Family History  Problem Relation Age of Onset  . Ulcerative colitis Paternal Grandmother   . Cancer Paternal Grandmother   . Colon polyps Paternal Grandfather   . Cancer Paternal Grandfather        thyroid  .  Diabetes Father   . Heart disease Father   . Kidney disease Father   . Hypertension Father     OBJECTIVE:  Vitals:   04/16/19 1621  BP: 126/87  Pulse: (!) 107  Resp: 18  Temp: 99.7 F (37.6 C)  SpO2: 97%    General appearance: ALERT; appears uncomfortable, tearful upon entering the room Head: NCAT Lungs: Normal respiratory effort; CTAB CV: RRR Musculoskeletal: Back Inspection: Skin warm, dry, clear and intact without obvious erythema, effusion, or ecchymosis.  Palpation: TTP over LT low back ROM: FROM active and passive Strength: 5/5 shld abduction, 5/5 shld adduction, 5/5 elbow flexion, 5/5 elbow extension, 5/5 grip strength, 5/5 hip flexion, 5/5 hip extension Skin: warm and dry Neurologic: Ambulates without difficulty; Sensation intact about the upper/ lower extremities Psychological: alert and cooperative; normal mood and affect  ASSESSMENT & PLAN:  1. Acute left-sided low back pain with left-sided sciatica   2. Muscle spasm     Meds ordered this encounter  Medications  . cyclobenzaprine (FLEXERIL) 10 MG tablet    Sig: Take 1 tablet (10 mg total) by mouth at bedtime.    Dispense:  15 tablet    Refill:  0    Order Specific Question:   Supervising Provider    Answer:   Raylene Everts [[id number]]  . dexamethasone (DECADRON) injection 10 mg    Continue conservative  management of rest, ice, heat, and gentle stretches/ massage Decadron shot given in office Take cyclobenzaprine at nighttime for symptomatic relief. Avoid driving or operating heavy machinery while using medication. Follow up with PCP if symptoms persist Return or go to the ER if you have any new or worsening symptoms (fever, chills, chest pain, abdominal pain, changes in bowel or bladder habits, pain radiating into lower legs, etc...)    Reviewed expectations re: course of current medical issues. Questions answered. Outlined signs and symptoms indicating need for more acute intervention. Patient verbalized understanding. After Visit Summary given.    Lestine Box, PA-C 04/16/19 1622

## 2019-04-16 NOTE — ED Triage Notes (Signed)
Pt presents with complaints of lower back pain x 2 days. 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 States it is worse with  Movement and to the touch. Reports tylenol is not helping at home.

## 2019-04-18 ENCOUNTER — Other Ambulatory Visit: Payer: Self-pay

## 2019-04-18 ENCOUNTER — Emergency Department (HOSPITAL_COMMUNITY)
Admission: EM | Admit: 2019-04-18 | Discharge: 2019-04-18 | Disposition: A | Discharge status: Home / Self Care | Payer: Medicaid Other | Attending: Emergency Medicine | Admitting: Emergency Medicine

## 2019-04-18 ENCOUNTER — Encounter (HOSPITAL_COMMUNITY): Payer: Self-pay

## 2019-04-18 ENCOUNTER — Emergency Department (HOSPITAL_COMMUNITY): Payer: Medicaid Other

## 2019-04-18 DIAGNOSIS — Z91040 Latex allergy status: Secondary | ICD-10-CM | POA: Insufficient documentation

## 2019-04-18 DIAGNOSIS — J45909 Unspecified asthma, uncomplicated: Secondary | ICD-10-CM | POA: Diagnosis not present

## 2019-04-18 DIAGNOSIS — F17210 Nicotine dependence, cigarettes, uncomplicated: Secondary | ICD-10-CM | POA: Insufficient documentation

## 2019-04-18 DIAGNOSIS — Z20822 Contact with and (suspected) exposure to covid-19: Secondary | ICD-10-CM | POA: Diagnosis not present

## 2019-04-18 DIAGNOSIS — Z79899 Other long term (current) drug therapy: Secondary | ICD-10-CM | POA: Diagnosis not present

## 2019-04-18 DIAGNOSIS — R5383 Other fatigue: Secondary | ICD-10-CM | POA: Diagnosis present

## 2019-04-18 DIAGNOSIS — Z9049 Acquired absence of other specified parts of digestive tract: Secondary | ICD-10-CM | POA: Insufficient documentation

## 2019-04-18 DIAGNOSIS — I5032 Chronic diastolic (congestive) heart failure: Secondary | ICD-10-CM | POA: Diagnosis not present

## 2019-04-18 DIAGNOSIS — E876 Hypokalemia: Secondary | ICD-10-CM | POA: Insufficient documentation

## 2019-04-18 LAB — CBC
HCT: 39.5 % (ref 36.0–46.0)
Hemoglobin: 13.1 g/dL (ref 12.0–15.0)
MCH: 31.9 pg (ref 26.0–34.0)
MCHC: 33.2 g/dL (ref 30.0–36.0)
MCV: 96.1 fL (ref 80.0–100.0)
Platelets: 250 10*3/uL (ref 150–400)
RBC: 4.11 MIL/uL (ref 3.87–5.11)
RDW: 13.3 % (ref 11.5–15.5)
WBC: 9.2 10*3/uL (ref 4.0–10.5)
nRBC: 0 % (ref 0.0–0.2)

## 2019-04-18 LAB — BASIC METABOLIC PANEL
Anion gap: 15 (ref 5–15)
BUN: 9 mg/dL (ref 6–20)
CO2: 20 mmol/L — ABNORMAL LOW (ref 22–32)
Calcium: 8.5 mg/dL — ABNORMAL LOW (ref 8.9–10.3)
Chloride: 102 mmol/L (ref 98–111)
Creatinine, Ser: 0.9 mg/dL (ref 0.44–1.00)
GFR calc Af Amer: >60 mL/min (ref >60)
GFR calc non Af Amer: >60 mL/min (ref >60)
Glucose, Bld: 108 mg/dL — ABNORMAL HIGH (ref 70–99)
Potassium: 2.2 mmol/L — CL (ref 3.5–5.1)
Sodium: 137 mmol/L (ref 135–145)

## 2019-04-18 LAB — I-STAT BETA HCG BLOOD, ED (MC, WL, AP ONLY): I-stat hCG, quantitative: <5 m[IU]/mL (ref <5)

## 2019-04-18 LAB — CBG MONITORING, ED: Glucose-Capillary: 85 mg/dL (ref 70–99)

## 2019-04-18 LAB — POC SARS CORONAVIRUS 2 AG -  ED: SARS Coronavirus 2 Ag: NEGATIVE

## 2019-04-18 MED ORDER — POTASSIUM CHLORIDE 10 MEQ/100ML IV SOLN: 10.0000 meq | INTRAVENOUS | Status: AC

## 2019-04-18 MED ORDER — POTASSIUM CHLORIDE ER 10 MEQ PO TBCR: 10.0000 meq | EXTENDED_RELEASE_TABLET | Freq: Two times a day (BID) | ORAL | 0 refills | Status: DC

## 2019-04-18 MED ORDER — POTASSIUM CHLORIDE CRYS ER 20 MEQ PO TBCR
40.0000 meq | EXTENDED_RELEASE_TABLET | Freq: Once | ORAL | Status: AC
  Administered 2019-04-18: 40 meq via ORAL
  Filled 2019-04-18: qty 2

## 2019-04-18 MED ORDER — POTASSIUM CHLORIDE 20 MEQ/15ML (10%) PO SOLN
40.0000 meq | Freq: Once | ORAL | Status: AC
  Administered 2019-04-18: 40 meq via ORAL
  Filled 2019-04-18: qty 30

## 2019-04-18 MED ORDER — MAGNESIUM 30 MG PO TABS: 30.0000 mg | ORAL_TABLET | Freq: Every day | ORAL | 0 refills | Status: DC

## 2019-04-18 MED ORDER — SODIUM CHLORIDE 0.9% FLUSH: 3.0000 mL | Freq: Once | INTRAVENOUS | Status: DC

## 2019-04-18 NOTE — Discharge Instructions (Addendum)
Your potassium level was very low today.  You were given IV and oral potassium.  Follow-up with your doctor this coming week to have that level rechecked.

## 2019-04-18 NOTE — Progress Notes (Signed)
IVT consult removed from board. IVT services no longer needed.

## 2019-04-18 NOTE — ED Provider Notes (Addendum)
[facility] EMERGENCY DEPARTMENT Provider Note   CSN: [id number] Arrival date & time: 04/18/19  1158     History Chief Complaint  Patient presents with  . Shortness of Breath    [name] is a 34 y.o. female.  HPI   Patient presents to the ED for evaluation of feeling shaky and fatigued.  Patient states she woke up last night shaking all over.  She thought it might be related to her blood sugar.  Patient states usually gets better after she drinks juice.  Patient also has been having somewhat of a cough and feeling short of breath for the last several days.  She denies any trouble with chest pain.  Patient denies any fevers or chills.  She denies any Covid exposures.  Patient states she gets regularly tested at work.  She has not been vaccinated.  Patient denies any leg swelling.  No focal numbness or weakness.  Patient does have history of congestive heart failure.  Patient states she was in the hospital for an extended period of time.  Patient states it was related to sepsis.  Patient has been having some trouble with back pain recently.  She went to an urgent care and was given a Decadron injection.  She was given prescriptions for Flexeril.  Patient denies any drug use.  She occasionally uses alcohol but not regularly.  Past Medical History:  Diagnosis Date  . Anemia, unspecified   . Anxiety   . Asthma   . Disc herniation    causes sciatica unsure which disc  . Esophageal reflux   . Fatty liver   . Fibromyalgia   . Gastroparesis   . Headache(784.0)   . Heart murmur   . History of narcotic addiction ([place]) 09/30/2012   2010:  Per pt, was addicted to narcotics; mom helped intervene and stopped taking opiods   . Hyperemesis gravidarum with metabolic disturbance, unspecified as to episode of care   . Irritable bowel syndrome   . MSSA bacteremia 02/20/2017  . Other and unspecified noninfectious gastroenteritis and colitis(558.9)   . Pregnant   . PTSD  (post-traumatic stress disorder)   . Septic arthritis of right sternoclavicular joint ([place]) 02/20/2017  . Unspecified asthma(493.90)     Patient Active Problem List   Diagnosis Date Noted  . Bilateral leg edema 02/05/2018  . Pancytopenia ([place])   . Blood per rectum   . Hematuria   . Chronic diastolic CHF (congestive heart failure) ([place]) 03/07/2017  . PFO (patent foramen ovale)   . Endocarditis of tricuspid valve   . Weakness of both lower extremities   . Injection of illicit drug within last 12 months   . Fibromyalgia 03/19/2012  . GERD 02/02/2009  . Irritable bowel syndrome 02/02/2009  . COLITIS 04/29/2008  . ANXIETY 04/28/2008  . ASTHMA 04/28/2008  . Gastroparesis 04/28/2008    Past Surgical History:  Procedure Laterality Date  . APPENDECTOMY    . CHOLECYSTECTOMY    . COLONOSCOPY    . INCISION AND DRAINAGE ABSCESS Left 04/09/2017   Procedure: INCISION AND DRAINAGE ABSCESS OF NECK;  Surgeon: Helayne Seminole, MD;  Location: [facility];  Service: ENT;  Laterality: Left;  . LAPAROSCOPIC BILATERAL SALPINGECTOMY Bilateral 12/08/2012   Procedure: LAPAROSCOPIC BILATERAL SALPINGECTOMY;  Surgeon: Jonnie Kind, MD;  Location: [facility];  Service: Gynecology;  Laterality: Bilateral;  . MULTIPLE EXTRACTIONS WITH ALVEOLOPLASTY N/A 12/01/2015   Procedure: EXTRACTION TEETH TWO, THREE, FOUR, SIX, SEVEN, EIGHT, NINE, TEN, ELEVEN,  TWELVE, FOURTEEN, FIFTEEN, TWENTY, TWENTY ONE, TWENTY EIGHT, TWENTY NINE, THIRTY AND THIRTY ONE WITH ALVEOLOPLASTY;  Surgeon: Ocie Doyne, DDS;  Location: [facility];  Service: Oral Surgery;  Laterality: N/A;  . TEE WITHOUT CARDIOVERSION N/A 02/20/2017   Procedure: TRANSESOPHAGEAL ECHOCARDIOGRAM (TEE);  Surgeon: Laurey Morale, MD;  Location: [facility];  Service: Cardiovascular;  Laterality: N/A;  . TEE WITHOUT CARDIOVERSION N/A 04/11/2017   Procedure: TRANSESOPHAGEAL ECHOCARDIOGRAM (TEE);  Surgeon: Orpah Cobb, MD;  Location: [facility] ENDOSCOPY;  Service: Cardiovascular;   Laterality: N/A;  . TUBAL LIGATION       OB History    Gravida  3   Para  3   Term  3   Preterm      AB      Living  3     SAB      TAB      Ectopic      Multiple      Live Births  3           Family History  Problem Relation Age of Onset  . Ulcerative colitis Paternal Grandmother   . Cancer Paternal Grandmother   . Colon polyps Paternal Grandfather   . Cancer Paternal Grandfather        thyroid  . Diabetes Father   . Heart disease Father   . Kidney disease Father   . Hypertension Father     Social History   Tobacco Use  . Smoking status: Current Every Day Smoker    Packs/day: 1.50    Years: 10.00    Pack years: 15.00    Types: Cigarettes  . Smokeless tobacco: Never Used  Substance Use Topics  . Alcohol use: No  . Drug use: No    Types: IV    Comment: pt denies 11/30/15    Home Medications Prior to Admission medications   Medication Sig Start Date End Date Taking? Authorizing Provider  Buprenorphine HCl-Naloxone HCl (SUBOXONE) 8-2 MG FILM Place 2 Film under the tongue daily.    Yes [provider]  cyclobenzaprine (FLEXERIL) 10 MG tablet Take 1 tablet (10 mg total) by mouth at bedtime. 04/16/19  Yes Wurst, [place], PA-C  ferrous sulfate 325 (65 FE) MG tablet Take 1 tablet (325 mg total) by mouth 3 (three) times daily with meals. Patient taking differently: Take 325 mg by mouth daily with breakfast.  04/15/17  Yes Rhetta Mura, MD  Multiple Vitamin (MULTIVITAMIN WITH MINERALS) TABS tablet Take 1 tablet by mouth daily. ALL NATURAL VITAMIN: Made by Romie Jumper (system six)   Yes [provider]  prazosin (MINIPRESS) 1 MG capsule Take 1 mg by mouth at bedtime.  01/20/18  Yes [provider]  venlafaxine XR (EFFEXOR-XR) 75 MG 24 hr capsule Take 75 mg by mouth 2 (two) times daily. 03/24/19  Yes [provider]  vitamin B-12 (CYANOCOBALAMIN) 1000 MCG tablet Take 1,000 mcg by mouth daily.    Yes [provider]  nicotine (NICODERM CQ - DOSED IN MG/24 HOURS) 21 mg/24hr patch Place 1 patch (21 mg total) onto the skin daily. Patient not taking: Reported on 04/18/2019 02/07/18   Orpah Cobb, MD  potassium chloride (KLOR-CON) 10 MEQ tablet Take 1 tablet (10 mEq total) by mouth 2 (two) times daily. 04/18/19   Linwood Dibbles, MD    Allergies    Azithromycin, Latex, Nsaids, Hydrocodone, Penicillins, and Prednisone  Review of Systems   Review of Systems  All other systems reviewed and are negative.   Physical  Exam Updated Vital Signs BP 125/63 (BP Location: Right Arm)   Pulse 96   Temp 99 F (37.2 C) (Oral)   Resp 20   Ht 1.702 m (5\' 7" )   Wt 101 kg   LMP 04/02/2019   SpO2 93%   BMI 34.87 kg/m   Physical Exam Vitals and nursing note reviewed.  Constitutional:      General: She is not in acute distress.    Appearance: She is well-developed.  HENT:     Head: Normocephalic and atraumatic.     Right Ear: External ear normal.     Left Ear: External ear normal.  Eyes:     General: No scleral icterus.       Right eye: No discharge.        Left eye: No discharge.     Conjunctiva/sclera: Conjunctivae normal.  Neck:     Trachea: No tracheal deviation.  Cardiovascular:     Rate and Rhythm: Normal rate and regular rhythm.  Pulmonary:     Effort: Pulmonary effort is normal. No respiratory distress.     Breath sounds: Normal breath sounds. No stridor. No wheezing or rales.  Abdominal:     General: Bowel sounds are normal. There is no distension.     Palpations: Abdomen is soft.     Tenderness: There is no abdominal tenderness. There is no guarding or rebound.  Musculoskeletal:        General: No tenderness.     Cervical back: Neck supple.  Skin:    General: Skin is warm and dry.     Findings: No rash.  Neurological:     Mental Status: She is alert.     Cranial Nerves: No cranial nerve deficit (no facial droop, extraocular movements intact, no slurred speech).     Sensory: No sensory  deficit.     Motor: No abnormal muscle tone or seizure activity.     Coordination: Coordination normal.     ED Results / Procedures / Treatments   Labs (all labs ordered are listed, but only abnormal results are displayed) Labs Reviewed  BASIC METABOLIC PANEL - Abnormal; Notable for the following components:      Result Value   Potassium 2.2 (*)    CO2 20 (*)    Glucose, Bld 108 (*)    Calcium 8.5 (*)    All other components within normal limits  CBC  I-STAT BETA HCG BLOOD, ED ([facility], [facility], [facility] ONLY)  CBG MONITORING, ED  POC SARS CORONAVIRUS 2 AG -  ED    EKG EKG Interpretation  Date/Time:  Sunday April 18 2019 11:57:58 EDT Ventricular Rate:  110 PR Interval:  128 QRS Duration: 90 QT Interval:  308 QTC Calculation: 416 R Axis:   71 Text Interpretation: Sinus tachycardia Right atrial enlargement Nonspecific ST abnormality Abnormal ECG Since last tracing rate faster Confirmed by 04-14-1977 [id number]) on 04/18/2019 2:50:36 PM   Radiology DG Chest 2 View  Result Date: 04/18/2019 CLINICAL DATA:  Shortness of breath EXAM: CHEST - 2 VIEW COMPARISON:  02/05/2018 FINDINGS: The heart size and mediastinal contours are within normal limits. Unchanged bandlike scarring of the bilateral lung bases. The visualized skeletal structures are unremarkable. IMPRESSION: No acute abnormality of the lungs. Unchanged bandlike scarring of the bilateral lung bases. Electronically Signed   By: 02/07/2018 M.D.   On: 04/18/2019 15:23    Procedures Procedures (including critical care time)  Medications Ordered in ED Medications  sodium chloride flush (NS)  0.9 % injection 3 mL (3 mLs Intravenous Not Given 04/18/19 1253)  potassium chloride 10 mEq in 100 mL IVPB (has no administration in time range)  potassium chloride SA (KLOR-CON) CR tablet 40 mEq (40 mEq Oral Given 04/18/19 1554)    ED Course  I have reviewed the triage vital signs and the nursing notes.  Pertinent labs & imaging results that were  available during my care of the patient were reviewed by me and considered in my medical decision making (see chart for details).  Clinical Course as of Apr 17 1698  Sun Apr 18, 2019  1236 Echo in 2020: Echocardiogram : Mild LV and RV systolic dysfunction, EF 45-50 %. Moderate RV dilatation and moderate TV regurgitation. No definite vegetation seen on TV.   [JK]  1237 CT surg notes from 2019 reviewed.  No need for surgery following her endocarditis   [JK]  1238 14 day suboxone rx 3/31   [JK]  1449 Covid negative   [JK]    Clinical Course User Index [JK] Linwood Dibbles, MD   MDM Rules/Calculators/A&P                      Patient presented to the ED for evaluation of shaking and weakness.  Her laboratory tests were notable for normal blood sugars but significant hypokalemia.  This is the likely cause of her weakness and shakiness.  Patient remained stable in the ED.  Vital signs are normal.  She is breathing easily.  Chest x-ray without signs of pneumonia or CHF.  Patient was given oral potassium and I had ordered IV potassium.  Nursing staff was unable to obtain an IV.  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  After initial dose of oral potassium we waited now and give the patient additional doses.  She is not having any vomiting.  EKG does not show any cardiac abnormalities.  At this point I think we will proceed with oral potassium replacement.  Patient instructed to follow-up with her doctor next week to have her potassium level rechecked.   Final Clinical Impression(s) / ED Diagnoses Final diagnoses:  Hypokalemia    Rx / DC Orders ED Discharge Orders         Ordered    potassium chloride (KLOR-CON) 10 MEQ tablet  2 times daily     04/18/19 1658           Linwood Dibbles, MD 04/18/19 1700    Linwood Dibbles, MD 04/18/19 [phone]

## 2019-04-18 NOTE — ED Triage Notes (Signed)
Pt woke up during last night "shaking".  Thinks it may be due to her diabetes. Pt got up and drank hawaiian punch and shaking stops.  Pt states this occurs at least twice a day for the past 1 1/2 weeks.    Does not check diabetes at home or take meds.  C/o cough and shortness of breath x several days.   Seen at urgent care yesterday for back pain, received prednisone injection and flexeril.

## 2019-04-18 NOTE — Progress Notes (Signed)
Consult received for IV placement. SecureChat sent to pt's nurse advising IV team will respond as soon as we are able; however, it might be more expedient if a co-worker or physician could start an IV utilizing ultrasound. Awaiting response from pt's nurse.

## 2019-04-20 IMAGING — DX DG CHEST 1V PORT
1 series · 1 of 1 positions shown · non-contrast
Comparison: 02/22/2017

CLINICAL DATA: Shortness of breath and leg pain

EXAM:
PORTABLE CHEST 1 VIEW

[chest ap]
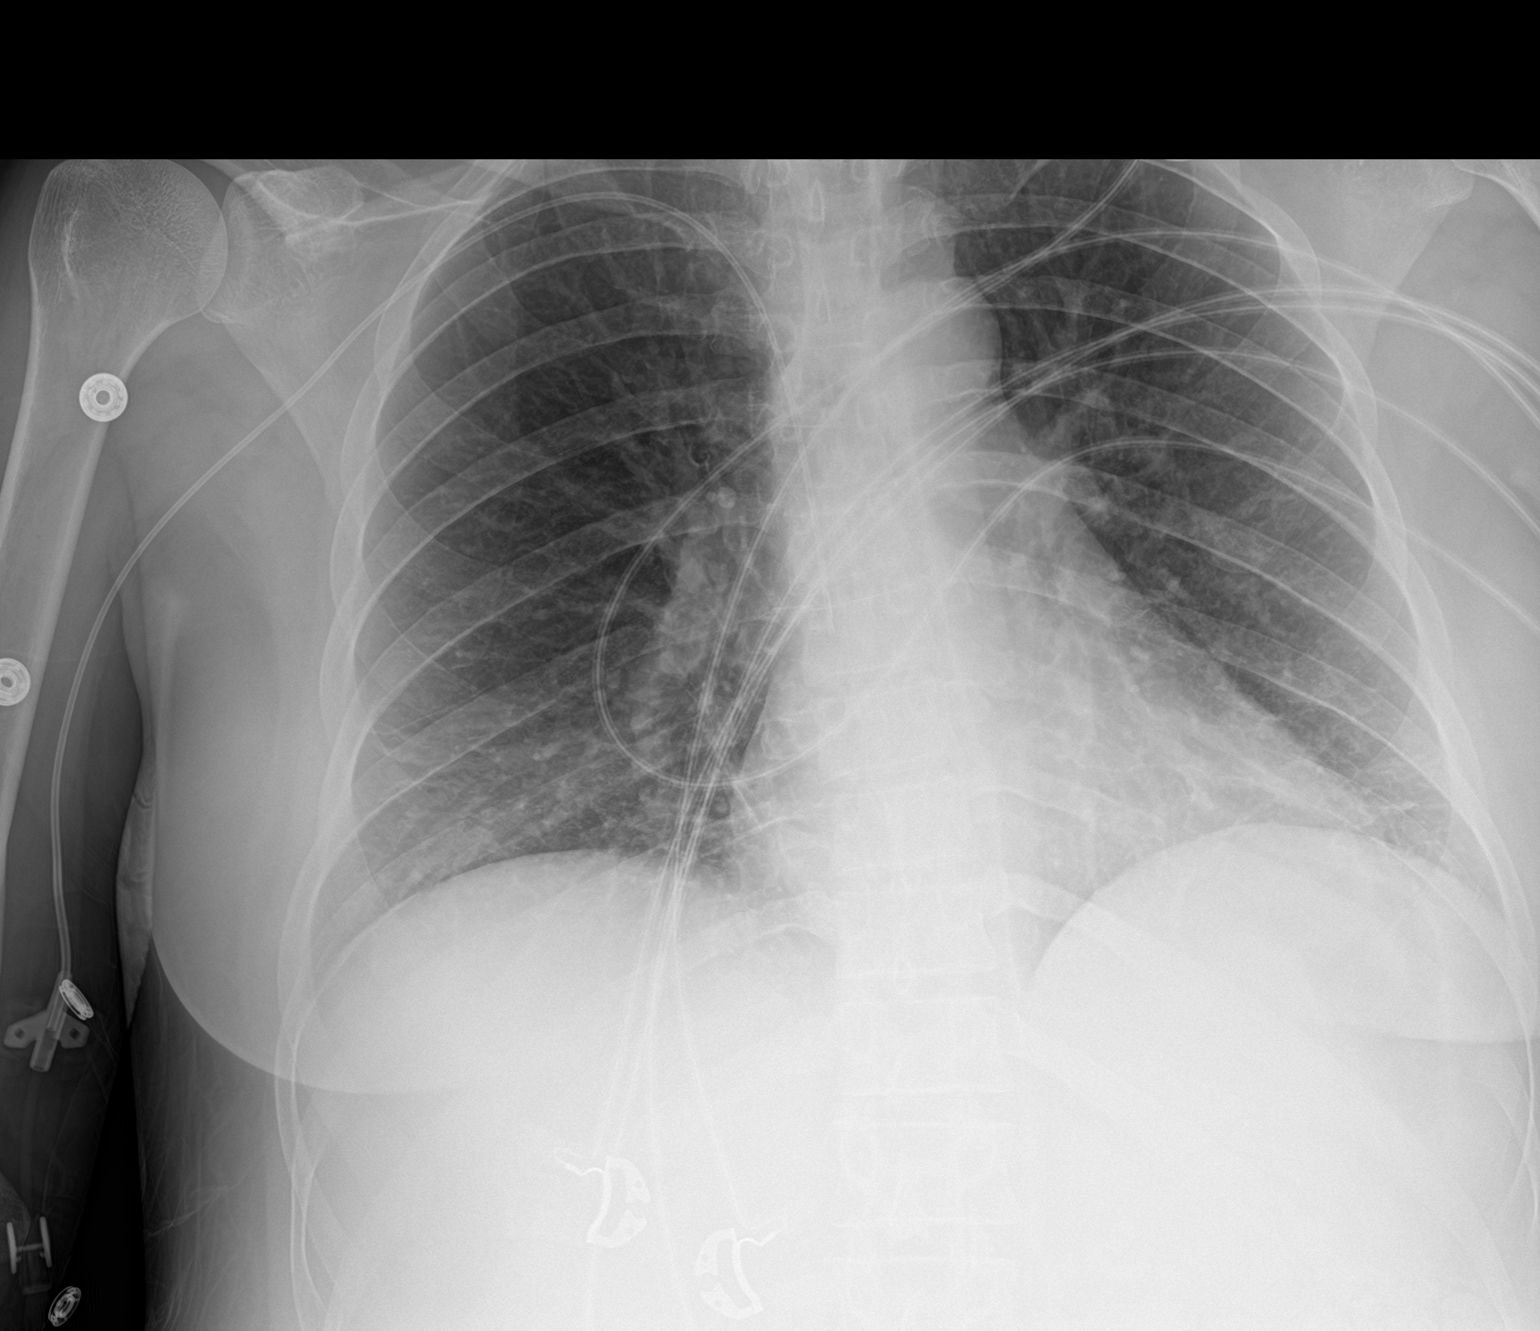

[1 of 1 positions shown; findings below may reference images not displayed]

FINDINGS: Cardiac shadow is within normal limits. Right-sided PICC line is now
seen in satisfactory position. The lungs are well aerated
bilaterally without focal infiltrate or sizable effusion. No bony
abnormality is noted.
IMPRESSION: No acute abnormality noted.

## 2019-04-21 IMAGING — CT CT NECK W/ CM
4 of 6 series · 13 of 33 positions shown, 15 images · IV contrast (APPLIED)
Comparison: None.

CLINICAL DATA: Left-sided neck mass and fever

EXAM:
CT NECK WITH CONTRAST
TECHNIQUE: Multidetector CT imaging of the neck was performed using the
standard protocol following the bolus administration of intravenous
contrast.
CONTRAST:  75mL NJWM5A-YGG IOPAMIDOL (NJWM5A-YGG) INJECTION 61%

[Series 3: neck 2.0 mpr ax · axial · 0.47mm/px · z∈[-312,-181]mm · 3 of 143 slices shown, 4 images (1 of 2)]
[im 36/143  soft-tissue]
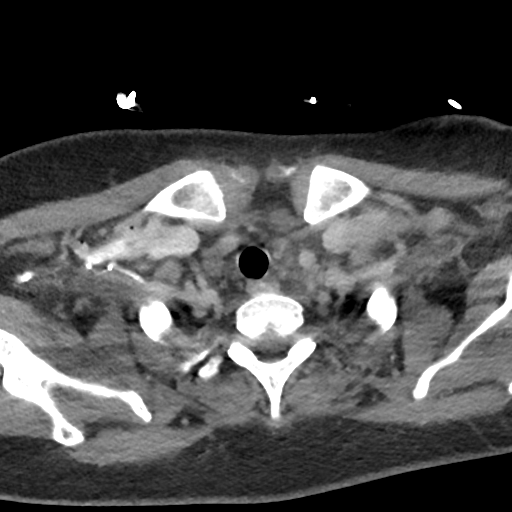
[im 36/143  bone]
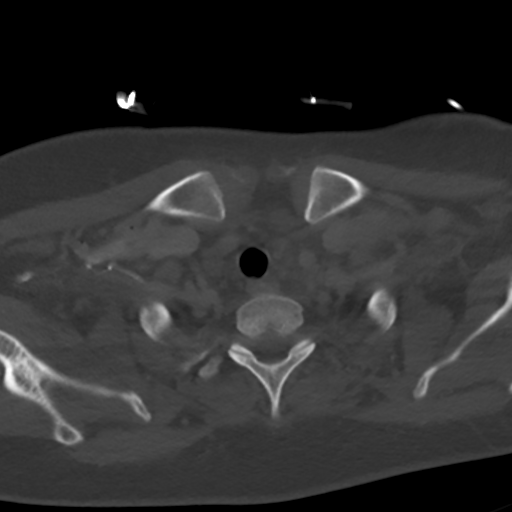
[im 72/143  bone]
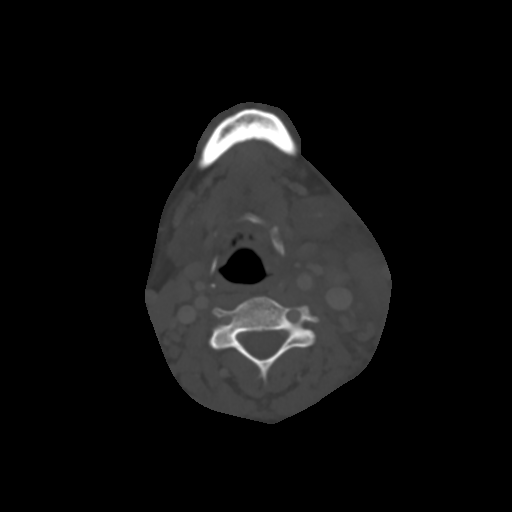
[im 107/143  bone]
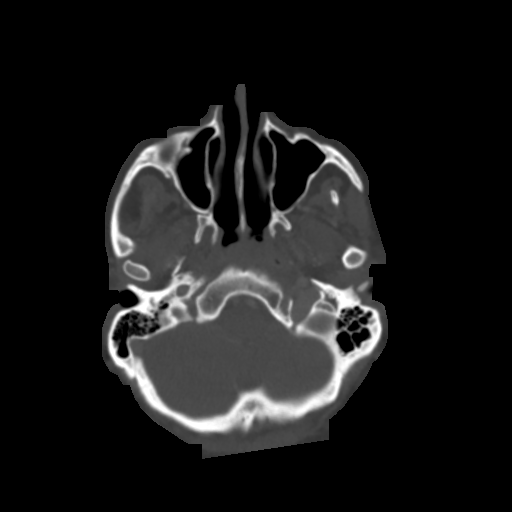

[Series 4: neck 2.0 mpr ax · axial · 0.40mm/px · z∈[-299,-211]mm · 2 of 144 slices shown (2 of 2)]
[im 48/144  bone]
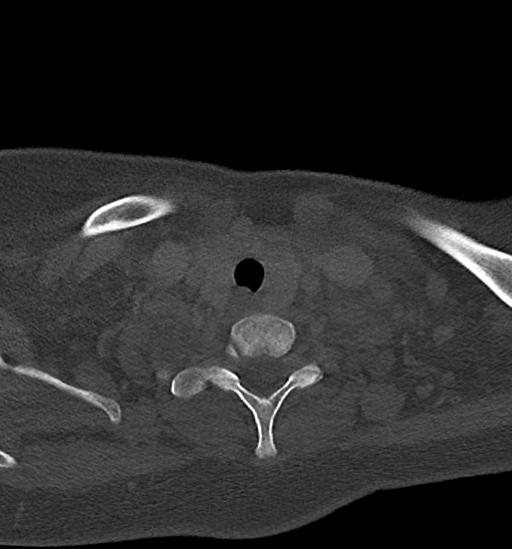
[im 96/144  bone]
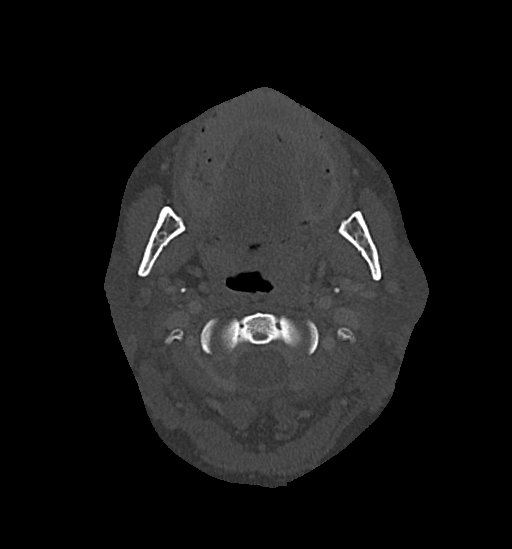

[Series 7: coronal st · coronal · 0.46mm/px · 3 of 93 slices shown]
[im 26/93  bone]
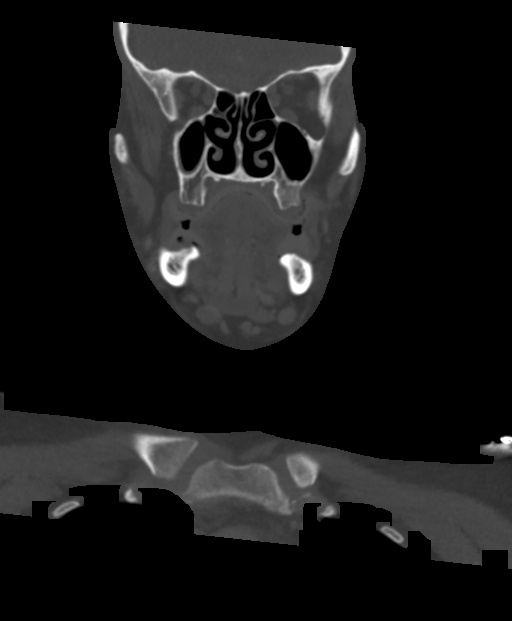
[im 39/93  bone]
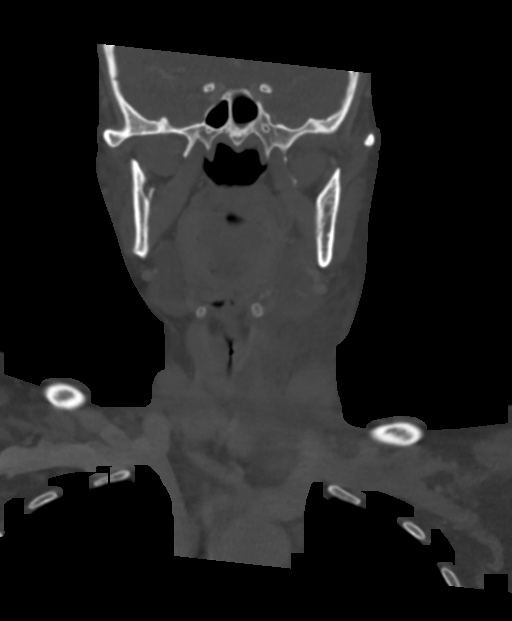
[im 52/93  bone]
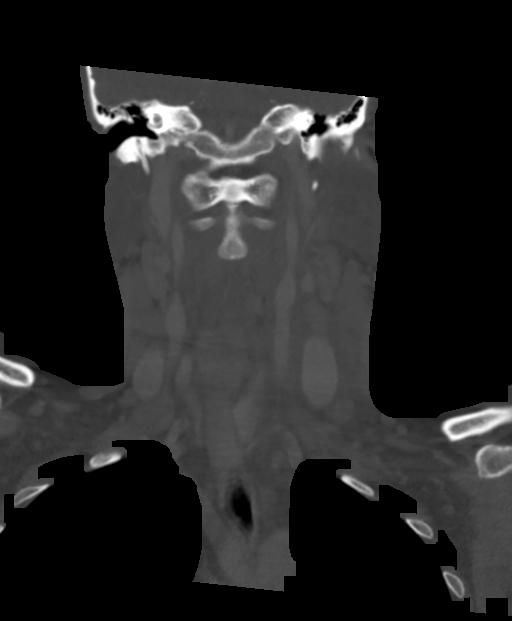

[Series 8: sagittal st · sagittal · 0.41mm/px · 5 of 101 slices shown, 6 images]
[im 34/101  bone]
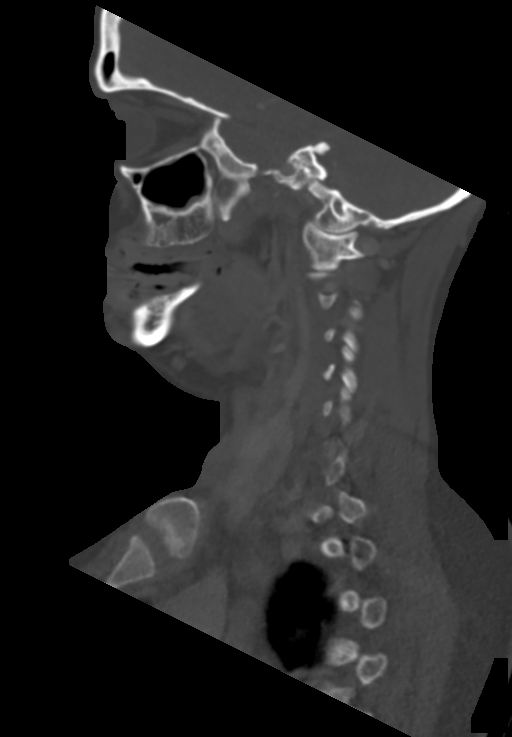
[im 42/101  bone]
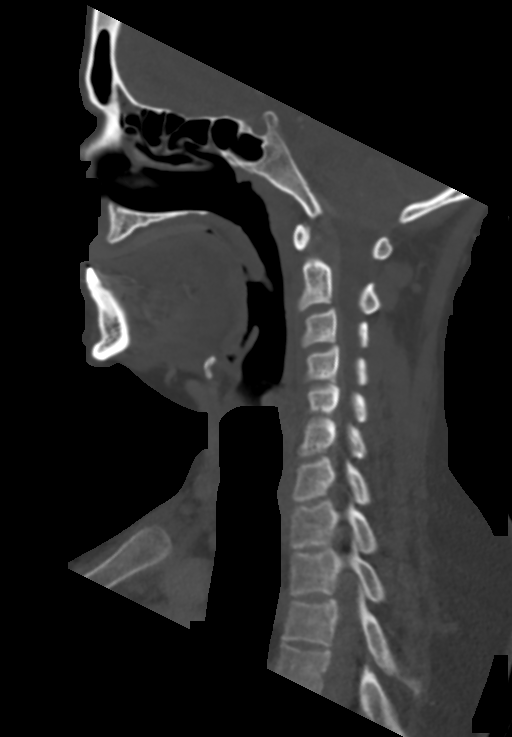
[im 51/101  soft-tissue]
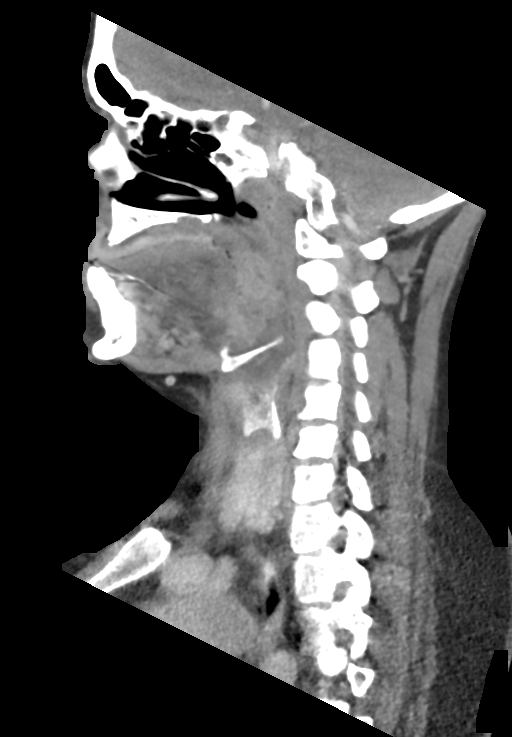
[im 51/101  bone]
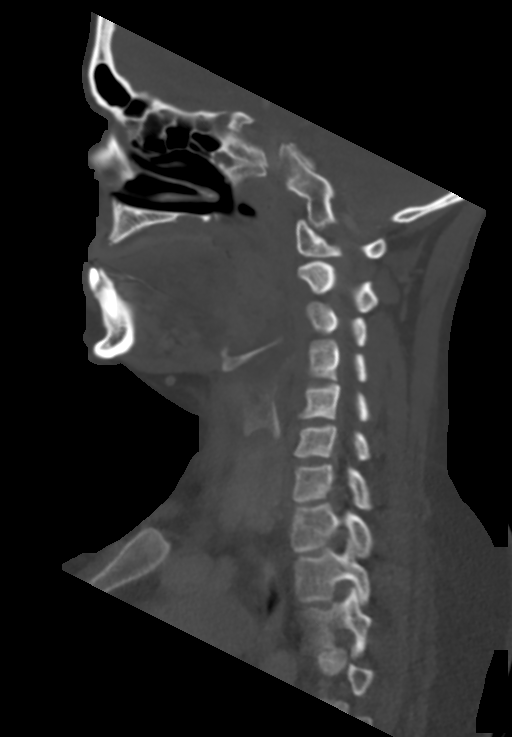
[im 59/101  bone]
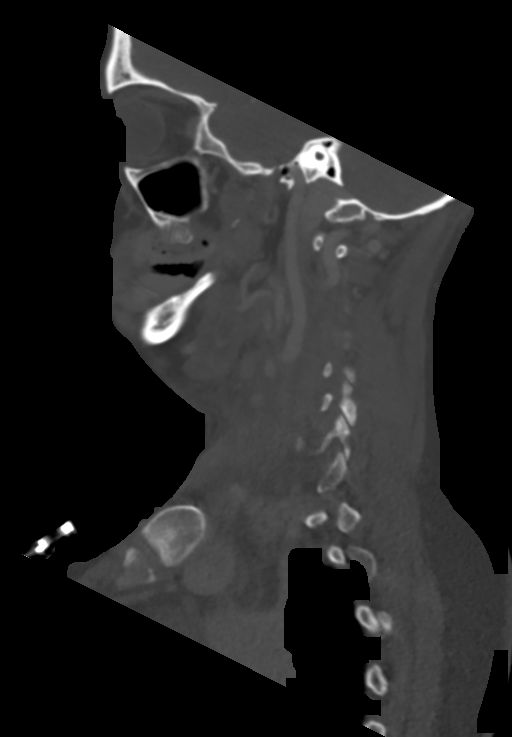
[im 67/101  bone]
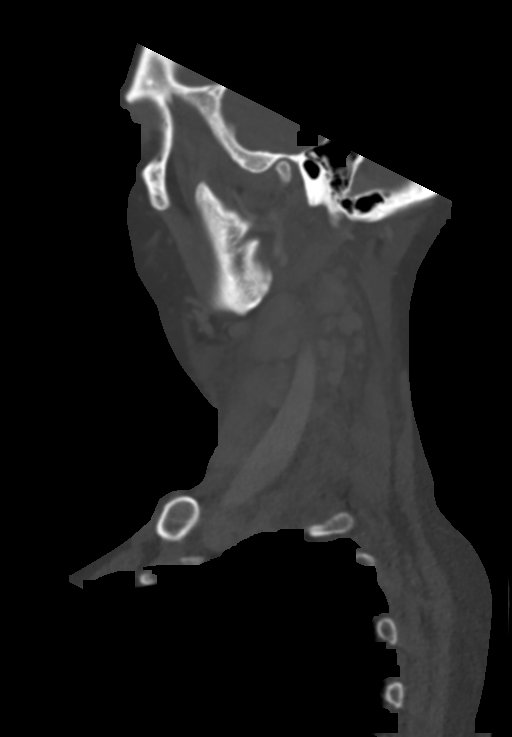

[13 of 33 positions shown; findings below may reference images not displayed]

FINDINGS: Pharynx and larynx:

--Nasopharynx: Fossae of Drey are clear. Normal adenoid
tonsils for age.

--Oral cavity and oropharynx: The palatine and lingual tonsils are
normal. The visible oral cavity and floor of mouth are normal.

--Hypopharynx: Normal vallecula and pyriform sinuses.

--Larynx: Normal epiglottis and pre-epiglottic space. Normal
aryepiglottic and vocal folds.

--Retropharyngeal space: No abscess, effusion or lymphadenopathy.

Salivary glands:

--Parotid: No mass lesion or inflammation. No sialolithiasis or
ductal dilatation.

--Submandibular: Reactive enlargement of left submandibular gland.
Normal right submandibular gland. No sialolithiasis.

--Sublingual: Normal. No ranula or other visible lesion of the base
of tongue and floor of mouth.

Thyroid: Normal.

Lymph nodes: There are bilateral enlarged lymph nodes, predominantly
within the left cervical chain. Left level II a nodes measure up to
13 mm. There are numerous subcentimeter bilateral level 5A and 5B
nodes. There is a large amount of soft tissue swelling of the left
neck with inflammation of the left sternocleidomastoid muscle and
inflammatory induration of the adjacent subcutaneous fat. There is
thickening of the left aspect of the platysma. Inflammation extends
into the left carotid and submandibular spaces.

Vascular: Major cervical vessels are patent.

Limited intracranial: Normal.

Visualized orbits: Normal.

Mastoids and visualized paranasal sinuses: No fluid levels or
advanced mucosal thickening. No mastoid effusion.

Skeleton: Soft tissue thickening at the right sternoclavicular joint
is compatible with reported septic arthritis.

Upper chest: Clear.

Other: None.
IMPRESSION: 1. Left sternocleidomastoid myositis with cellulitis of the adjacent
subcutaneous fat and probable reactive thickening of the left
platysma muscle. Inflammatory change also extends into the left
submandibular and left carotid spaces. The origin of the process is
not clear. If there has been no injection or penetrating injury in
this area, then hematogenous spread of infection would be an
alternative possibility.
2. No abscess or drainable fluid collection.
3. Reactive cervical lymphadenopathy, left worse than right, and
reactive edema of the left submandibular gland.
4. Extension of inflammation to the left carotid space, but no
vascular occlusion.
5. Soft tissue thickening at the right sternoclavicular joint,
compatible with reported septic arthritis.

## 2019-08-21 ENCOUNTER — Other Ambulatory Visit: Payer: Self-pay

## 2019-08-21 ENCOUNTER — Ambulatory Visit
Admission: EM | Admit: 2019-08-21 | Discharge: 2019-08-21 | Disposition: A | Discharge status: Home / Self Care | Payer: Medicaid Other | Attending: Emergency Medicine | Admitting: Emergency Medicine

## 2019-08-21 ENCOUNTER — Encounter: Payer: Self-pay | Admitting: Emergency Medicine

## 2019-08-21 DIAGNOSIS — H109 Unspecified conjunctivitis: Secondary | ICD-10-CM

## 2019-08-21 MED ORDER — POLYMYXIN B-TRIMETHOPRIM 10000-0.1 UNIT/ML-% OP SOLN: 1.0000 [drp] | Freq: Four times a day (QID) | OPHTHALMIC | 0 refills | Status: AC

## 2019-08-21 NOTE — Discharge Instructions (Addendum)
Use eye drops as prescribed and to completion Dispose of old contacts and wear glasses until you have finished course of antibiotic eye drops Wash pillow cases, wash hands regularly with soap and water, avoid touching your face and eyes, wash door handles, light switches, remotes and other objects you frequently touch Return or follow up with PCP if symptoms persists such as fever, chills, redness, swelling, eye pain, painful eye movements, vision changes, etc... 

## 2019-08-21 NOTE — ED Triage Notes (Signed)
Lt eye pain and drainage x 1 day.  Pt states her insurance will not pay for contacts so she has been wearing her mothers contacts

## 2019-08-21 NOTE — ED Provider Notes (Signed)
[facility]   [id number] 08/21/19 Arrival Time: 1300  CC: Red eye  SUBJECTIVE:  [name] is a 34 y.o. female who presents with complaint of LT eye redness that began 1 day ago.  Symptoms began after using mother's contact lens.  Denies alleviating or aggravating factors.  Reports previous symptoms in the past with eye infection.  Reports associated discomfort and purulent drainage and itching.  Denies fever, chills, nausea, vomiting, eye pain, painful eye movements, vision changes, double vision, FB sensation, periorbital erythema.     ROS: As per HPI.  All other pertinent ROS negative.     Past Medical History:  Diagnosis Date  . Anemia, unspecified   . Anxiety   . Asthma   . Disc herniation    causes sciatica unsure which disc  . Esophageal reflux   . Fatty liver   . Fibromyalgia   . Gastroparesis   . Headache(784.0)   . Heart murmur   . History of narcotic addiction (HCC) 09/30/2012   2010:  Per pt, was addicted to narcotics; mom helped intervene and stopped taking opiods   . Hyperemesis gravidarum with metabolic disturbance, unspecified as to episode of care   . Irritable bowel syndrome   . MSSA bacteremia 02/20/2017  . Other and unspecified noninfectious gastroenteritis and colitis(558.9)   . Pregnant   . PTSD (post-traumatic stress disorder)   . Septic arthritis of right sternoclavicular joint (HCC) 02/20/2017  . Unspecified asthma(493.90)    Past Surgical History:  Procedure Laterality Date  . APPENDECTOMY    . CHOLECYSTECTOMY    . COLONOSCOPY    . INCISION AND DRAINAGE ABSCESS Left 04/09/2017   Procedure: INCISION AND DRAINAGE ABSCESS OF NECK;  Surgeon: Graylin Shiver, MD;  Location: [facility] OR;  Service: ENT;  Laterality: Left;  . LAPAROSCOPIC BILATERAL SALPINGECTOMY Bilateral 12/08/2012   Procedure: LAPAROSCOPIC BILATERAL SALPINGECTOMY;  Surgeon: Tilda Burrow, MD;  Location: [facility]S;  Service: Gynecology;  Laterality: Bilateral;  . MULTIPLE  EXTRACTIONS WITH ALVEOLOPLASTY N/A 12/01/2015   Procedure: EXTRACTION TEETH TWO, THREE, FOUR, SIX, SEVEN, EIGHT, NINE, TEN, ELEVEN, TWELVE, FOURTEEN, FIFTEEN, TWENTY, TWENTY ONE, TWENTY EIGHT, TWENTY NINE, THIRTY AND THIRTY ONE WITH ALVEOLOPLASTY;  Surgeon: Ocie Doyne, DDS;  Location: [facility] OR;  Service: Oral Surgery;  Laterality: N/A;  . TEE WITHOUT CARDIOVERSION N/A 02/20/2017   Procedure: TRANSESOPHAGEAL ECHOCARDIOGRAM (TEE);  Surgeon: Laurey Morale, MD;  Location: [facility] ENDOSCOPY;  Service: Cardiovascular;  Laterality: N/A;  . TEE WITHOUT CARDIOVERSION N/A 04/11/2017   Procedure: TRANSESOPHAGEAL ECHOCARDIOGRAM (TEE);  Surgeon: Orpah Cobb, MD;  Location: [facility] ENDOSCOPY;  Service: Cardiovascular;  Laterality: N/A;  . TUBAL LIGATION     Allergies  Allergen Reactions  . Azithromycin Nausea And Vomiting    ABDOMINAL PAIN  . Latex Itching  . Nsaids     UNSPECIFIED REACTION    . Hydrocodone Nausea And Vomiting  . Penicillins Nausea And Vomiting     Has patient had a PCN reaction causing immediate rash, facial/tongue/throat swelling, SOB or lightheadedness with hypotension: No Has patient had a PCN reaction causing severe rash involving mucus membranes or skin necrosis: No Has patient had a PCN reaction that required hospitalization No Has patient had a PCN reaction occurring within the last 10 years: No If all of the above answers are "NO", then may proceed with Cephalosporin use.   . Prednisone Nausea Only and Other (See Comments)    Can take shot, but pill form "caused stomach pain , nausea"  No current facility-administered medications on file prior to encounter.   Current Outpatient Medications on File Prior to Encounter  Medication Sig Dispense Refill  . Buprenorphine HCl-Naloxone HCl (SUBOXONE) 8-2 MG FILM Place 2 Film under the tongue daily.     . cyclobenzaprine (FLEXERIL) 10 MG tablet Take 1 tablet (10 mg total) by mouth at bedtime. 15 tablet 0  . ferrous sulfate 325 (65 FE) MG  tablet Take 1 tablet (325 mg total) by mouth 3 (three) times daily with meals. (Patient taking differently: Take 325 mg by mouth daily with breakfast. ) 90 tablet 3  . magnesium 30 MG tablet Take 1 tablet (30 mg total) by mouth daily. 5 tablet 0  . Multiple Vitamin (MULTIVITAMIN WITH MINERALS) TABS tablet Take 1 tablet by mouth daily. ALL NATURAL VITAMIN: Made by Romie Jumper (system six)    . nicotine (NICODERM CQ - DOSED IN MG/24 HOURS) 21 mg/24hr patch Place 1 patch (21 mg total) onto the skin daily. (Patient not taking: Reported on 04/18/2019) 28 patch 1  . potassium chloride (KLOR-CON) 10 MEQ tablet Take 1 tablet (10 mEq total) by mouth 2 (two) times daily. 10 tablet 0  . prazosin (MINIPRESS) 1 MG capsule Take 1 mg by mouth at bedtime.     Marland Kitchen venlafaxine XR (EFFEXOR-XR) 75 MG 24 hr capsule Take 75 mg by mouth 2 (two) times daily.    . vitamin B-12 (CYANOCOBALAMIN) 1000 MCG tablet Take 1,000 mcg by mouth daily.      Social History   Socioeconomic History  . Marital status: Single    Spouse name: Not on file  . Number of children: 2  . Years of education: Not on file  . Highest education level: Not on file  Occupational History  . Occupation: CNA  Tobacco Use  . Smoking status: Current Every Day Smoker    Packs/day: 1.50    Years: 10.00    Pack years: 15.00    Types: Cigarettes  . Smokeless tobacco: Never Used  Vaping Use  . Vaping Use: Never used  Substance and Sexual Activity  . Alcohol use: No  . Drug use: No    Types: IV    Comment: pt denies 11/30/15  . Sexual activity: Never    Birth control/protection: Surgical  Other Topics Concern  . Not on file  Social History Narrative   Daily caffeine    Social Determinants of Health   Financial Resource Strain:   . Difficulty of Paying Living Expenses:   Food Insecurity:   . Worried About Programme researcher, broadcasting/film/video in the Last Year:   . Barista in the Last Year:   Transportation Needs:   . Freight forwarder (Medical):     Marland Kitchen Lack of Transportation (Non-Medical):   Physical Activity:   . Days of Exercise per Week:   . Minutes of Exercise per Session:   Stress:   . Feeling of Stress :   Social Connections:   . Frequency of Communication with Friends and Family:   . Frequency of Social Gatherings with Friends and Family:   . Attends Religious Services:   . Active Member of Clubs or Organizations:   . Attends Banker Meetings:   Marland Kitchen Marital Status:   Intimate Partner Violence:   . Fear of Current or Ex-Partner:   . Emotionally Abused:   Marland Kitchen Physically Abused:   . Sexually Abused:    Family History  Problem Relation Age of Onset  . Ulcerative  colitis Paternal Grandmother   . Cancer Paternal Grandmother   . Colon polyps Paternal Grandfather   . Cancer Paternal Grandfather        thyroid  . Diabetes Father   . Heart disease Father   . Kidney disease Father   . Hypertension Father     OBJECTIVE:  Vitals:   08/21/19 1315 08/21/19 1323  BP: 113/78   Pulse: 100   Resp: 17   Temp: 98.9 F (37.2 C)   TempSrc: Oral   SpO2: 92%   Weight:  222 lb 10.6 oz (101 kg)  Height:  5\' 7"  (1.702 m)    General appearance: alert; no distress HENT: NCAT; EACs clear, TMs pearly gray; nares patent; oropharynx clear Eyes: mild to trace conjunctival erythema in 4-6 o'clock position. PERRL; EOMI without discomfort;  no obvious drainage Neck: supple Lungs: clear to auscultation bilaterally Heart: regular rate and rhythm Skin: warm and dry Psychological: alert and cooperative; normal mood and affect   ASSESSMENT & PLAN:  1. Bacterial conjunctivitis of left eye     Meds ordered this encounter  Medications  . trimethoprim-polymyxin b (POLYTRIM) ophthalmic solution    Sig: Place 1 drop into the left eye 4 (four) times daily for 10 days.    Dispense:  10 mL    Refill:  0    Order Specific Question:   Supervising Provider    Answer:   Eustace Moore    Use eye drops as prescribed  and to completion Dispose of old contacts and wear glasses until you have finished course of antibiotic eye drops Wash pillow cases, wash hands regularly with soap and water, avoid touching your face and eyes, wash door handles, light switches, remotes and other objects you frequently touch Return or follow up with PCP if symptoms persists such as fever, chills, redness, swelling, eye pain, painful eye movements, vision changes, etc...   Reviewed expectations re: course of current medical issues. Questions answered. Outlined signs and symptoms indicating need for more acute intervention. Patient verbalized understanding. After Visit Summary given.   [[id number]], PA-C 08/21/19 1359

## 2019-09-23 ENCOUNTER — Other Ambulatory Visit: Payer: Self-pay

## 2019-09-23 ENCOUNTER — Other Ambulatory Visit (HOSPITAL_COMMUNITY)
Admission: RE | Admit: 2019-09-23 | Discharge: 2019-09-23 | Disposition: A | Discharge status: Home / Self Care | Payer: Medicaid Other | Source: Ambulatory Visit | Attending: Obstetrics & Gynecology | Admitting: Obstetrics & Gynecology

## 2019-09-23 ENCOUNTER — Encounter: Payer: Self-pay | Admitting: Obstetrics & Gynecology

## 2019-09-23 ENCOUNTER — Other Ambulatory Visit: Payer: Medicaid Other | Admitting: Obstetrics & Gynecology

## 2019-09-23 ENCOUNTER — Ambulatory Visit (INDEPENDENT_AMBULATORY_CARE_PROVIDER_SITE_OTHER): Payer: Medicaid Other | Admitting: Obstetrics & Gynecology

## 2019-09-23 VITALS — BP 103/71 | HR 82 | Ht 67.0 in | Wt 208.5 lb

## 2019-09-23 DIAGNOSIS — Z0000 Encounter for general adult medical examination without abnormal findings: Secondary | ICD-10-CM | POA: Diagnosis not present

## 2019-09-23 DIAGNOSIS — N946 Dysmenorrhea, unspecified: Secondary | ICD-10-CM

## 2019-09-23 DIAGNOSIS — E65 Localized adiposity: Secondary | ICD-10-CM

## 2019-09-23 DIAGNOSIS — N920 Excessive and frequent menstruation with regular cycle: Secondary | ICD-10-CM | POA: Diagnosis not present

## 2019-09-23 DIAGNOSIS — Z01419 Encounter for gynecological examination (general) (routine) without abnormal findings: Secondary | ICD-10-CM

## 2019-09-23 DIAGNOSIS — Z113 Encounter for screening for infections with a predominantly sexual mode of transmission: Secondary | ICD-10-CM | POA: Diagnosis not present

## 2019-09-23 NOTE — Progress Notes (Signed)
Subjective:     [name] is a 34 y.o. female here for a routine exam.  Patient's last menstrual period was 09/16/2019 (approximate). [id number] Birth Control Method:  Tubal ligation Menstrual Calendar(currently): regular heavy   Current complaints: periods and pelvic relaxation.   Current acute medical issues:  smoking   Recent Gynecologic History Patient's last menstrual period was 09/16/2019 (approximate). Last Pap: 2014,  normal Last mammogram: n/a,    Past Medical History:  Diagnosis Date  . Anemia, unspecified   . Anxiety   . Asthma   . Congestive heart failure (CHF) (HCC)   . Disc herniation    causes sciatica unsure which disc  . Esophageal reflux   . Fatty liver   . Fibromyalgia   . Gastroparesis   . Headache(784.0)   . Heart murmur   . History of narcotic addiction (HCC) 09/30/2012   2010:  Per pt, was addicted to narcotics; mom helped intervene and stopped taking opiods   . Hyperemesis gravidarum with metabolic disturbance, unspecified as to episode of care   . Irritable bowel syndrome   . MSSA bacteremia 02/20/2017  . Other and unspecified noninfectious gastroenteritis and colitis(558.9)   . Pregnant   . PTSD (post-traumatic stress disorder)   . Septic arthritis of right sternoclavicular joint (HCC) 02/20/2017  . Unspecified asthma(493.90)     Past Surgical History:  Procedure Laterality Date  . APPENDECTOMY    . CHOLECYSTECTOMY    . COLONOSCOPY    . INCISION AND DRAINAGE ABSCESS Left 04/09/2017   Procedure: INCISION AND DRAINAGE ABSCESS OF NECK;  Surgeon: Graylin Shiver, MD;  Location: [facility] OR;  Service: ENT;  Laterality: Left;  . LAPAROSCOPIC BILATERAL SALPINGECTOMY Bilateral 12/08/2012   Procedure: LAPAROSCOPIC BILATERAL SALPINGECTOMY;  Surgeon: Tilda Burrow, MD;  Location: [facility] ORS;  Service: Gynecology;  Laterality: Bilateral;  . MULTIPLE EXTRACTIONS WITH ALVEOLOPLASTY N/A 12/01/2015   Procedure: EXTRACTION TEETH TWO, THREE, FOUR, SIX, SEVEN, EIGHT,  NINE, TEN, ELEVEN, TWELVE, FOURTEEN, FIFTEEN, TWENTY, TWENTY ONE, TWENTY EIGHT, TWENTY NINE, THIRTY AND THIRTY ONE WITH ALVEOLOPLASTY;  Surgeon: Ocie Doyne, DDS;  Location: [facility] OR;  Service: Oral Surgery;  Laterality: N/A;  . TEE WITHOUT CARDIOVERSION N/A 02/20/2017   Procedure: TRANSESOPHAGEAL ECHOCARDIOGRAM (TEE);  Surgeon: Laurey Morale, MD;  Location: [facility] ENDOSCOPY;  Service: Cardiovascular;  Laterality: N/A;  . TEE WITHOUT CARDIOVERSION N/A 04/11/2017   Procedure: TRANSESOPHAGEAL ECHOCARDIOGRAM (TEE);  Surgeon: Orpah Cobb, MD;  Location: [facility] ENDOSCOPY;  Service: Cardiovascular;  Laterality: N/A;  . TUBAL LIGATION      OB History    Gravida  3   Para  3   Term  3   Preterm      AB      Living  3     SAB      TAB      Ectopic      Multiple      Live Births  3           Social History   Socioeconomic History  . Marital status: Single    Spouse name: Not on file  . Number of children: 2  . Years of education: Not on file  . Highest education level: Not on file  Occupational History  . Occupation: CNA  Tobacco Use  . Smoking status: Current Every Day Smoker    Packs/day: 1.50    Years: 10.00    Pack years: 15.00    Types: Cigarettes  . Smokeless tobacco: Never Used  Vaping Use  . Vaping Use: Never used  Substance and Sexual Activity  . Alcohol use: Yes    Comment: on weekends  . Drug use: No    Types: IV    Comment: pt denies 11/30/15  . Sexual activity: Yes    Birth control/protection: Surgical    Comment: tubal  Other Topics Concern  . Not on file  Social History Narrative   Daily caffeine    Social Determinants of Health   Financial Resource Strain: Low Risk   . Difficulty of Paying Living Expenses: Not hard at all  Food Insecurity: No Food Insecurity  . Worried About Programme researcher, broadcasting/film/video in the Last Year: Never true  . Ran Out of Food in the Last Year: Never true  Transportation Needs: No Transportation Needs  . Lack of Transportation  (Medical): No  . Lack of Transportation (Non-Medical): No  Physical Activity: Insufficiently Active  . Days of Exercise per Week: 2 days  . Minutes of Exercise per Session: 30 min  Stress: Stress Concern Present  . Feeling of Stress : Very much  Social Connections: Moderately Isolated  . Frequency of Communication with Friends and Family: More than three times a week  . Frequency of Social Gatherings with Friends and Family: Once a week  . Attends Religious Services: More than 4 times per year  . Active Member of Clubs or Organizations: No  . Attends Banker Meetings: Never  . Marital Status: Never married    Family History  Problem Relation Age of Onset  . Ulcerative colitis Paternal Grandmother   . Cancer Paternal Grandmother   . Colon polyps Paternal Grandfather   . Cancer Paternal Grandfather        thyroid  . Diabetes Father   . Heart disease Father   . Kidney disease Father   . Hypertension Father      Current Outpatient Medications:  .  Buprenorphine HCl-Naloxone HCl (SUBOXONE) 8-2 MG FILM, Place 2 Film under the tongue daily. , Disp: , Rfl:  .  busPIRone (BUSPAR) 15 MG tablet, TAKE 2 TABLETS BY MOUTHCTWICE DAILY., Disp: , Rfl:  .  ferrous sulfate 325 (65 FE) MG tablet, Take 1 tablet (325 mg total) by mouth 3 (three) times daily with meals. (Patient taking differently: Take 325 mg by mouth daily with breakfast. ), Disp: 90 tablet, Rfl: 3 .  magnesium 30 MG tablet, Take 1 tablet (30 mg total) by mouth daily., Disp: 5 tablet, Rfl: 0 .  potassium chloride (KLOR-CON) 10 MEQ tablet, Take 1 tablet (10 mEq total) by mouth 2 (two) times daily., Disp: 10 tablet, Rfl: 0  Review of Systems  Review of Systems  Constitutional: Negative for fever, chills, weight loss, malaise/fatigue and diaphoresis.  HENT: Negative for hearing loss, ear pain, nosebleeds, congestion, sore throat, neck pain, tinnitus and ear discharge.   Eyes: Negative for blurred vision, double vision,  photophobia, pain, discharge and redness.  Respiratory: Negative for cough, hemoptysis, sputum production, shortness of breath, wheezing and stridor.   Cardiovascular: Negative for chest pain, palpitations, orthopnea, claudication, leg swelling and PND.  Gastrointestinal: negative for abdominal pain. Negative for heartburn, nausea, vomiting, diarrhea, constipation, blood in stool and melena.  Genitourinary: Negative for dysuria, urgency, frequency, hematuria and flank pain.  Musculoskeletal: Negative for myalgias, back pain, joint pain and falls.  Skin: Negative for itching and rash.  Neurological: Negative for dizziness, tingling, tremors, sensory change, speech change, focal weakness, seizures, loss of consciousness, weakness and headaches.  Endo/Heme/Allergies: Negative for environmental allergies and polydipsia. Does not bruise/bleed easily.  Psychiatric/Behavioral: Negative for depression, suicidal ideas, hallucinations, memory loss and substance abuse. The patient is not nervous/anxious and does not have insomnia.        Objective:  Blood pressure 103/71, pulse 82, height 5\' 7"  (1.702 m), weight 208 lb 8 oz (94.6 kg), last menstrual period 09/16/2019.   Physical Exam  Vitals reviewed. Constitutional: She is oriented to person, place, and time. She appears well-developed and well-nourished.  HENT:  Head: Normocephalic and atraumatic.        Right Ear: External ear normal.  Left Ear: External ear normal.  Nose: Nose normal.  Mouth/Throat: Oropharynx is clear and moist.  Eyes: Conjunctivae and EOM are normal. Pupils are equal, round, and reactive to light. Right eye exhibits no discharge. Left eye exhibits no discharge. No scleral icterus.  Neck: Normal range of motion. Neck supple. No tracheal deviation present. No thyromegaly present.  Cardiovascular: Normal rate, regular rhythm, normal heart sounds and intact distal pulses.  Exam reveals no gallop and no friction rub.   No murmur  heard. Respiratory: Effort normal and breath sounds normal. No respiratory distress. She has no wheezes. She has no rales. She exhibits no tenderness.  GI: Soft. Bowel sounds are normal. She exhibits no distension and no mass. There is no tenderness. There is no rebound and no guarding.  Genitourinary:  Breasts no masses skin changes or nipple changes bilaterally      Vulva is normal without lesions Grade 2 bladder prolapse Vagina is pink moist without discharge Cervix normal in appearance and pap is done Uterus is grade 2 prolapse Adnexa is negative with normal sized ovaries   Musculoskeletal: Normal range of motion. She exhibits no edema and no tenderness.  Neurological: She is alert and oriented to person, place, and time. She has normal reflexes. She displays normal reflexes. No cranial nerve deficit. She exhibits normal muscle tone. Coordination normal.  Skin: Skin is warm and dry. No rash noted. No erythema. No pallor.  Psychiatric: She has a normal mood and affect. Her behavior is normal. Judgment and thought content normal.       Medications Ordered at today's visit: No orders of the defined types were placed in this encounter.   Other orders placed at today's visit: Orders Placed This Encounter  Procedures  . 11/16/2019 PELVIS (TRANSABDOMINAL ONLY)  . US PELVIS (TRANSABDOMINAL ONLY)  . HIV Antibody (routine testing w rflx)  . RPR  . Hemoglobin A1C      Assessment:    Normal Gyn exam.   menorrhagia dysmenorrhea Grade 2 uterine and bladder prolapse prolapse Plan:         Return for GYN sono.

## 2019-09-28 ENCOUNTER — Telehealth: Payer: Self-pay | Admitting: Obstetrics & Gynecology

## 2019-09-28 NOTE — Telephone Encounter (Signed)
Pt informed that results not back.

## 2019-09-28 NOTE — Telephone Encounter (Signed)
Pt is calling to get results of pap and her std results. I looked and didn't see that pap was back yet. But you can look behind me and see if I missed it.  Thank You

## 2019-09-30 LAB — CYTOLOGY - PAP
Chlamydia: NEGATIVE
Comment: NEGATIVE
Comment: NEGATIVE
Comment: NEGATIVE
Comment: NORMAL
HPV 16: NEGATIVE
HPV 18 / 45: POSITIVE — AB
High risk HPV: POSITIVE — AB
Neisseria Gonorrhea: NEGATIVE

## 2019-10-01 ENCOUNTER — Telehealth: Payer: Self-pay | Admitting: *Deleted

## 2019-10-01 NOTE — Telephone Encounter (Signed)
Patient informed pap abnormal.  Per Dr Despina Hidden, she needs a colpo. Briefly explained procedure and scheduled.

## 2019-10-12 ENCOUNTER — Other Ambulatory Visit: Payer: Self-pay | Admitting: *Deleted

## 2019-10-12 DIAGNOSIS — N920 Excessive and frequent menstruation with regular cycle: Secondary | ICD-10-CM

## 2019-10-12 DIAGNOSIS — N946 Dysmenorrhea, unspecified: Secondary | ICD-10-CM

## 2019-10-18 ENCOUNTER — Other Ambulatory Visit: Payer: Self-pay | Admitting: Obstetrics & Gynecology

## 2019-10-18 DIAGNOSIS — N946 Dysmenorrhea, unspecified: Secondary | ICD-10-CM

## 2019-10-18 DIAGNOSIS — N920 Excessive and frequent menstruation with regular cycle: Secondary | ICD-10-CM

## 2019-10-21 ENCOUNTER — Other Ambulatory Visit: Payer: Medicaid Other

## 2019-10-21 ENCOUNTER — Ambulatory Visit (HOSPITAL_COMMUNITY)
Admission: RE | Admit: 2019-10-21 | Discharge: 2019-10-21 | Disposition: A | Discharge status: Home / Self Care | Payer: Medicaid Other | Source: Ambulatory Visit | Attending: Obstetrics & Gynecology | Admitting: Obstetrics & Gynecology

## 2019-10-21 ENCOUNTER — Other Ambulatory Visit: Payer: Self-pay

## 2019-10-21 DIAGNOSIS — N946 Dysmenorrhea, unspecified: Secondary | ICD-10-CM | POA: Diagnosis present

## 2019-10-21 DIAGNOSIS — N920 Excessive and frequent menstruation with regular cycle: Secondary | ICD-10-CM | POA: Insufficient documentation

## 2019-10-22 ENCOUNTER — Encounter: Payer: Self-pay | Admitting: Obstetrics & Gynecology

## 2019-10-22 ENCOUNTER — Ambulatory Visit (INDEPENDENT_AMBULATORY_CARE_PROVIDER_SITE_OTHER): Payer: Medicaid Other | Admitting: Obstetrics & Gynecology

## 2019-10-22 VITALS — BP 103/68 | HR 71 | Ht 67.0 in | Wt 206.0 lb

## 2019-10-22 DIAGNOSIS — Z3202 Encounter for pregnancy test, result negative: Secondary | ICD-10-CM | POA: Diagnosis not present

## 2019-10-22 LAB — POCT URINE PREGNANCY: Preg Test, Ur: NEGATIVE

## 2019-10-22 NOTE — Progress Notes (Signed)
Colposcopy Procedure Note:  Colposcopy Procedure Note  Indications:  2021 LSIL/+ HR HPV/neg HPV 16/neg HPV 18   2019 ASCCP recommendation:  Smoker:  Yes.   New sexual partner:  No.  : time frame:    History of abnormal Pap: no  Procedure Details  The risks and benefits of the procedure and Written informed consent obtained.  Speculum placed in vagina and excellent visualization of cervix achieved, cervix swabbed x 3 with acetic acid solution.  Findings: Cervix: no visible lesions, no mosaicism, no punctation and no abnormal vasculature; SCJ visualized 360 degrees without lesions and no biopsies taken. Vaginal inspection: normal without visible lesions. Vulvar colposcopy: vulvar colposcopy not performed.  Specimens: none  Complications: none.  Plan(Based on 2019 ASCCP recommendations) Repeat Pap 1 year [facility] BS for her relxation issues to be scheduled when moratorium is lifted

## 2019-12-29 ENCOUNTER — Ambulatory Visit
Admission: EM | Admit: 2019-12-29 | Discharge: 2019-12-29 | Disposition: A | Discharge status: Home / Self Care | Payer: Medicaid Other | Attending: Physician Assistant | Admitting: Physician Assistant

## 2019-12-29 ENCOUNTER — Encounter: Payer: Self-pay | Admitting: Emergency Medicine

## 2019-12-29 ENCOUNTER — Ambulatory Visit (INDEPENDENT_AMBULATORY_CARE_PROVIDER_SITE_OTHER): Payer: Medicaid Other

## 2019-12-29 DIAGNOSIS — J069 Acute upper respiratory infection, unspecified: Secondary | ICD-10-CM

## 2019-12-29 DIAGNOSIS — R059 Cough, unspecified: Secondary | ICD-10-CM | POA: Diagnosis not present

## 2019-12-29 NOTE — Discharge Instructions (Addendum)
Covid and Influenza test are pending 

## 2019-12-29 NOTE — ED Triage Notes (Signed)
Cough, nasal congestion and fatigue x 4-5 days.

## 2019-12-31 LAB — COVID-19, FLU A+B NAA
Influenza A, NAA: DETECTED — AB
Influenza B, NAA: NOT DETECTED
SARS-CoV-2, NAA: NOT DETECTED

## 2020-01-02 NOTE — ED Provider Notes (Signed)
[facility]    CSN: [id number] Arrival date & time: 12/29/19  5643      History   Chief Complaint Chief Complaint  Patient presents with  . Cough    HPI DEMITA TOBIA is a 34 y.o. female.   The history is provided by the patient. No language interpreter was used.  Cough Cough characteristics:  Non-productive Sputum characteristics:  Nondescript Severity:  Moderate Onset quality:  Gradual Timing:  Constant Chronicity:  New Smoker: no   Relieved by:  Nothing Worsened by:  Nothing Ineffective treatments:  None tried Associated symptoms: no shortness of breath   Risk factors: no recent infection     Past Medical History:  Diagnosis Date  . Anemia, unspecified   . Anxiety   . Asthma   . Congestive heart failure (CHF) (HCC)   . Disc herniation    causes sciatica unsure which disc  . Esophageal reflux   . Fatty liver   . Fibromyalgia   . Gastroparesis   . Headache(784.0)   . Heart murmur   . History of narcotic addiction (HCC) 09/30/2012   2010:  Per pt, was addicted to narcotics; mom helped intervene and stopped taking opiods   . Hyperemesis gravidarum with metabolic disturbance, unspecified as to episode of care   . Irritable bowel syndrome   . MSSA bacteremia 02/20/2017  . Other and unspecified noninfectious gastroenteritis and colitis(558.9)   . Pregnant   . PTSD (post-traumatic stress disorder)   . Septic arthritis of right sternoclavicular joint (HCC) 02/20/2017  . Unspecified asthma(493.90)     Patient Active Problem List   Diagnosis Date Noted  . Bilateral leg edema 02/05/2018  . Pancytopenia (HCC)   . Blood per rectum   . Hematuria   . Chronic diastolic CHF (congestive heart failure) (HCC) 03/07/2017  . PFO (patent foramen ovale)   . Endocarditis of tricuspid valve   . Weakness of both lower extremities   . Injection of illicit drug within last 12 months   . Fibromyalgia 03/19/2012  . GERD 02/02/2009  . Irritable bowel syndrome  02/02/2009  . COLITIS 04/29/2008  . ANXIETY 04/28/2008  . ASTHMA 04/28/2008  . Gastroparesis 04/28/2008    Past Surgical History:  Procedure Laterality Date  . APPENDECTOMY    . CHOLECYSTECTOMY    . COLONOSCOPY    . INCISION AND DRAINAGE ABSCESS Left 04/09/2017   Procedure: INCISION AND DRAINAGE ABSCESS OF NECK;  Surgeon: Graylin Shiver, MD;  Location: [facility] OR;  Service: ENT;  Laterality: Left;  . LAPAROSCOPIC BILATERAL SALPINGECTOMY Bilateral 12/08/2012   Procedure: LAPAROSCOPIC BILATERAL SALPINGECTOMY;  Surgeon: Tilda Burrow, MD;  Location: [facility] ORS;  Service: Gynecology;  Laterality: Bilateral;  . MULTIPLE EXTRACTIONS WITH ALVEOLOPLASTY N/A 12/01/2015   Procedure: EXTRACTION TEETH TWO, THREE, FOUR, SIX, SEVEN, EIGHT, NINE, TEN, ELEVEN, TWELVE, FOURTEEN, FIFTEEN, TWENTY, TWENTY ONE, TWENTY EIGHT, TWENTY NINE, THIRTY AND THIRTY ONE WITH ALVEOLOPLASTY;  Surgeon: Ocie Doyne, DDS;  Location: [facility] OR;  Service: Oral Surgery;  Laterality: N/A;  . TEE WITHOUT CARDIOVERSION N/A 02/20/2017   Procedure: TRANSESOPHAGEAL ECHOCARDIOGRAM (TEE);  Surgeon: Laurey Morale, MD;  Location: [facility] ENDOSCOPY;  Service: Cardiovascular;  Laterality: N/A;  . TEE WITHOUT CARDIOVERSION N/A 04/11/2017   Procedure: TRANSESOPHAGEAL ECHOCARDIOGRAM (TEE);  Surgeon: Orpah Cobb, MD;  Location: [facility] ENDOSCOPY;  Service: Cardiovascular;  Laterality: N/A;  . TUBAL LIGATION      OB History    Gravida  3   Para  3   Term  3   Preterm      AB      Living  3     SAB      IAB      Ectopic      Multiple      Live Births  3            Home Medications    Prior to Admission medications   Medication Sig Start Date End Date Taking? Authorizing Provider  Buprenorphine HCl-Naloxone HCl (SUBOXONE) 8-2 MG FILM Place 2 Film under the tongue daily.     [provider]  busPIRone (BUSPAR) 15 MG tablet TAKE 2 TABLETS BY MOUTHCTWICE DAILY. 08/26/19   [provider]  ferrous sulfate 325 (65  FE) MG tablet Take 1 tablet (325 mg total) by mouth 3 (three) times daily with meals. Patient taking differently: Take 325 mg by mouth daily with breakfast.  04/15/17   Rhetta Mura, MD  magnesium 30 MG tablet Take 1 tablet (30 mg total) by mouth daily. Patient not taking: Reported on 10/22/2019 04/18/19   Linwood Dibbles, MD  potassium chloride (KLOR-CON) 10 MEQ tablet Take 1 tablet (10 mEq total) by mouth 2 (two) times daily. 04/18/19   Linwood Dibbles, MD    Family History Family History  Problem Relation Age of Onset  . Ulcerative colitis Paternal Grandmother   . Cancer Paternal Grandmother   . Colon polyps Paternal Grandfather   . Cancer Paternal Grandfather        thyroid  . Diabetes Father   . Heart disease Father   . Kidney disease Father   . Hypertension Father     Social History Social History   Tobacco Use  . Smoking status: Current Every Day Smoker    Packs/day: 1.50    Years: 10.00    Pack years: 15.00    Types: Cigarettes  . Smokeless tobacco: Never Used  Vaping Use  . Vaping Use: Never used  Substance Use Topics  . Alcohol use: Yes    Comment: on weekends  . Drug use: No    Types: IV    Comment: pt denies 11/30/15     Allergies   Azithromycin, Latex, Nsaids, Hydrocodone, Penicillins, and Prednisone   Review of Systems Review of Systems  Respiratory: Positive for cough. Negative for shortness of breath.   All other systems reviewed and are negative.    Physical Exam Triage Vital Signs ED Triage Vitals  Enc Vitals Group     BP 12/29/19 0849 103/70     Pulse Rate 12/29/19 0849 68     Resp 12/29/19 0849 18     Temp 12/29/19 0849 98.7 F (37.1 C)     Temp Source 12/29/19 0849 Oral     SpO2 12/29/19 0849 95 %     Weight 12/29/19 0847 210 lb (95.3 kg)     Height 12/29/19 0847 5\' 7"  (1.702 m)     Head Circumference --      Peak Flow --      Pain Score 12/29/19 0847 0     Pain Loc --      Pain Edu? --      Excl. in GC? --    No data  found.  Updated Vital Signs BP 103/70 (BP Location: Right Arm)   Pulse 68   Temp 98.7 F (37.1 C) (Oral)   Resp 18   Ht 5\' 7"  (1.702 m)   Wt 95.3 kg   LMP 12/15/2019  SpO2 95%   BMI 32.89 kg/m   Visual Acuity Right Eye Distance:   Left Eye Distance:   Bilateral Distance:    Right Eye Near:   Left Eye Near:    Bilateral Near:     Physical Exam Vitals and nursing note reviewed.  Constitutional:      Appearance: She is well-developed and well-nourished.  HENT:     Head: Normocephalic.  Eyes:     Extraocular Movements: EOM normal.  Cardiovascular:     Rate and Rhythm: Normal rate.  Pulmonary:     Effort: Pulmonary effort is normal.  Abdominal:     General: There is no distension.  Musculoskeletal:        General: Normal range of motion.     Cervical back: Normal range of motion.  Skin:    General: Skin is warm.  Neurological:     General: No focal deficit present.     Mental Status: She is alert and oriented to person, place, and time.  Psychiatric:        Mood and Affect: Mood and affect and mood normal.      UC Treatments / Results  Labs (all labs ordered are listed, but only abnormal results are displayed) Labs Reviewed  COVID-19, FLU A+B NAA - Abnormal; Notable for the following components:      Result Value   Influenza A, NAA Detected (*)    All other components within normal limits   Narrative:    Test(s) 140142-Influenza A, NAA; 140143-Influenza B, NAA was developed and its performance characteristics determined by [facility]. It has not been cleared or approved by the Food and Drug Administration. Performed at:  [address], [place], [place]  [id number] Lab Director: Jolene Schimke MD, Phone:  [phone]    EKG   Radiology No results found.  Procedures Procedures (including critical care time)  Medications Ordered in UC Medications - No data to display  Initial Impression / Assessment and Plan / UC Course   I have reviewed the triage vital signs and the nursing notes.  Pertinent labs & imaging results that were available during my care of the patient were reviewed by me and considered in my medical decision making (see chart for details).     MDM:  Pthas symptoms consistent with viral illness.  Pt counseled on symptomatic care Final Clinical Impressions(s) / UC Diagnoses   Final diagnoses:  Cough  Viral URI     Discharge Instructions     Covid and Influenza test are pending    ED Prescriptions    None    An After Visit Summary was printed and given to the patient.  PDMP not reviewed this encounter.   Elson Areas, [place] 01/02/20 1453

## 2020-02-23 ENCOUNTER — Other Ambulatory Visit: Payer: Self-pay | Admitting: Obstetrics & Gynecology

## 2020-02-24 NOTE — Patient Instructions (Signed)
[name]  02/24/2020     @PREFPERIOPPHARMACY @   Your procedure is scheduled on  03/01/2020   Report to [facility] at 0800 A.M.   Call this number if you have problems the morning of surgery:  [phone]      You may not have any solid food after midnight 02/29/2020. You may have clear liquids until 0530. Drink your carb drink down, do not sip it at 0530 and take your medications at this time. Nothing else to drink after 0530.                        Take these medicines the morning of surgery with A SIP OF WATER Suboxone and buspar   Shower with CHG the night before and the morning of your procedure. DO NOT put CHG on your face, hair or genitals.  After your night shower, dry off with a clean towel, put on clean clothes to sleep in and place clean sheets on your bed before you sleep DO NOT sleep with any pets this night.  After your morning shower, dry off with a clean towel, put on clean, comfortable clothes and brush your teeth before you come to the hospital.      Do not wear jewelry, make-up or nail polish.  Do not wear lotions, powders, or perfumes, or deodorant.  Do not shave 48 hours prior to surgery.  Men may shave face and neck.  Do not bring valuables to the hospital.  [facility] is not responsible for any belongings or valuables.  Contacts, dentures or bridgework may not be worn into surgery.  Leave your suitcase in the car.  After surgery it may be brought to your room.  For patients admitted to the hospital, discharge time will be determined by your treatment team.  Patients discharged the day of surgery will not be allowed to drive home and must have someone with them for 24 hours.   Special instructions:  DO NOT smoke tobacco or vape the morning of your procedure.  Please read over the following fact sheets that you were given. Coughing and Deep Breathing, Surgical Site Infection Prevention, Anesthesia Post-op Instructions and Care and Recovery  After Surgery              Anterior and Posterior Colporrhaphy, Care After The following information offers guidance on how to care for yourself after your procedure. Your health care provider may also give you more specific instructions. If you have problems or questions, contact your health care provider. What can I expect after the procedure? After the procedure, it is common to have:  Pain in the surgical area.  Vaginal spotting and discharge. You will need to use a sanitary pad during this time.  Tiredness (fatigue). Follow these instructions at home: Medicines  Take over-the-counter and prescription medicines only as told by your health care provider.  Ask your health care provider if the medicine prescribed to you: ? Requires you to avoid driving or using machinery. ? Can cause constipation. You may need to take these actions to prevent or treat constipation:  Drink enough fluid to keep your urine pale yellow.  Take over-the-counter or prescription medicines.  Eat foods that are high in fiber, such as beans, whole grains, and fresh fruits and vegetables.  Limit foods that are high in fat and processed sugars, such as fried or sweet foods. Incision care  Check your incision area every day  for signs of infection. Check for: ? More fluid or blood coming from your vagina. ? Pus or a bad-smelling discharge from your vagina.  Do not take baths, swim, or use a hot tub until your health care provider approves. You may take showers.  Keep the area between your vagina and rectum (perineal area) clean and dry. Make sure you clean the area after every bowel movement and each time you urinate.  Ask your health care provider if you can take a sitz bath or sit in a tub of clean, warm water. Activity  Rest as told by your health care provider.  Avoid sitting for a long time without moving. Get up to take short walks every 1-2 hours. This is important to improve blood flow  and breathing. Ask for help if you feel weak or unsteady.  Avoid activities that take a lot of effort.  Limit stair climbing to once or twice a day in the first week, then slowly increase this activity.  Do not lift anything that is heavier than 5 lb (2.3 kg), or the limit that you are told, until your health care provider says that it is safe. Avoid pushing or pulling motions.  Avoid standing for long periods of time.  Do not drive until your health care provider says that it is safe.  Return to your normal activities as told by your health care provider. Ask your health care provider what activities are safe for you.   General instructions  You may be instructed to do pelvic floor exercises (Kegel exercises). Do them as told by your health care provider.  Avoid sex for several weeks after surgery.  Do not douche or use tampons until your health care provider says it is okay.  Wear compression stockings as told by your health care provider. These stockings help to prevent blood clots and reduce swelling in your legs.  Keep all follow-up visits. This is important. Contact a health care provider if:  Medicine does not help your pain.  You have frequent or urgent urination, or you are unable to completely empty your bladder.  You feel a burning sensation when urinating.  You have more fluid or blood coming from your vaginal area.  You have pus or a bad-smelling discharge coming from the vaginal area. Get help right away if:  You cannot urinate.  You have a fever or chills.  You have trouble breathing. These symptoms may represent a serious problem that is an emergency. Do not wait to see if the symptoms will go away. Get medical help right away. Call your local emergency services (911 in the [place] Do not drive yourself to the hospital. Summary  After the procedure, it is common to have pain, tiredness (fatigue), and vaginal spotting and discharge.  Take medicines only as  told by your health care provider. Ask your health care provider whether your medicines may cause constipation or require you to avoid driving or using machinery.  Keep the area between your vagina and rectum (perineal area) area clean and dry. Clean the area after every bowel movement and each time you urinate.  Rest after the procedure. Avoid activities that require a lot of effort, and do not lift heavy objects.  Get help right away if you have trouble breathing, you have a fever or chills, or you cannot urinate. This information is not intended to replace advice given to you by your health care provider. Make sure you discuss any questions you have with  your health care provider. Document Revised: 06/29/2019 Document Reviewed: 06/29/2019 Elsevier Patient Education  2021 [employer]   Vaginal Hysterectomy, Care After The following information offers guidance on how to care for yourself after your procedure. Your health care provider may also give you more specific instructions. If you have problems or questions, contact your health care provider. What can I expect after the procedure? After the procedure, it is common to have:  Pain in the lower abdomen and vagina.  Vaginal bleeding and discharge for up to 1 week. You will need to use a sanitary pad after this procedure.  Difficulty having a bowel movement (constipation).  Temporary problems emptying the bladder.  Tiredness (fatigue).  Poor appetite.  Less interest in sex.  Feelings of sadness or other emotions. If your ovaries were also removed, it is also common to have symptoms of menopause, such as hot flashes, night sweats, and lack of sleep (insomnia). Follow these instructions at home: Medicines  Take over-the-counter and prescription medicines only as told by your health care provider.  Do not take aspirin or NSAIDs, such as ibuprofen. These medicines can cause bleeding.  Ask your health care provider if the  medicine prescribed to you: ? Requires you to avoid driving or using machinery. ? Can cause constipation. You may need to take these actions to prevent or treat constipation:  Drink enough fluid to keep your urine pale yellow.  Take over-the-counter or prescription medicines.  Eat foods that are high in fiber, such as beans, whole grains, and fresh fruits and vegetables.  Limit foods that are high in fat and processed sugars, such as fried or sweet foods.   Activity  Rest as told by your health care provider.  Return to your normal activities as told by your health care provider. Ask your health care provider what activities are safe for you  Avoid sitting for a long time without moving. Get up to take short walks every 1-2 hours. This is important to improve blood flow and breathing. Ask for help if you feel weak or unsteady.  Try to have someone home with you for 1-2 weeks to help you with everyday chores.  Do not lift anything that is heavier than 10 lb (4.5 kg), or the limit that you are told, until your health care provider says that it is safe.  If you were given a sedative during the procedure, it can affect you for several hours. Do not drive or operate machinery until your health care provider says that it is safe.   Lifestyle  Do not use any products that contain nicotine or tobacco. These products include cigarettes, chewing tobacco, and vaping devices, such as e-cigarettes. These can delay healing after surgery. If you need help quitting, ask your health care provider.  Do not drink alcohol until your health care provider approves. General instructions  Do not douche, use tampons, or have sex for at least 6 weeks, or as told by your health care provider.  If you struggle with physical or emotional changes after your procedure, speak with your health care provider or a therapist.  The stitches inside your vagina will dissolve over time and do not need to be taken  out.  Do not take baths, swim, or use a hot tub until your health care provider approves. You may only be allowed to take showers for 2-3 weeks  Wear compression stockings as told by your health care provider. These stockings help to prevent blood clots and  reduce swelling in your legs.  Keep all follow-up visits. This is important. Contact a health care provider if:  Your pain medicine is not helping.  You have a fever.  You have nausea or vomiting that does not go away.  You feel dizzy.  You have blood, pus, or a bad-smelling discharge from your vagina more than 1 week after the procedure.  You continue to have trouble urinating 3-5 days after the procedure. Get help right away if:  You have severe pain in your abdomen or back.  You faint.  You have heavy vaginal bleeding and blood clots, soaking through a sanitary pad in less than 1 hour.  You have chest pain or shortness of breath.  You have pain, swelling, or redness in your leg. These symptoms may represent a serious problem that is an emergency. Do not wait to see if the symptoms will go away. Get medical help right away. Call your local emergency services (911 in the [place] Do not drive yourself to the hospital. Summary  After the procedure, it is common to have pain, vaginal bleeding, constipation, temporary problems emptying your bladder, and feelings of sadness or other emotions.  Take over-the-counter and prescription medicines only as told by your health care provider.  Rest as told by your health care provider. Return to your normal activities as told by your health care provider.  Contact a health care provider if your pain medicine is not helping, or you have a fever, dizziness, or trouble urinating several days after the procedure.  Get help right away if you have severe pain in your abdomen or back, or if you faint, have heavy bleeding, or have chest pain or shortness of breath. This information is not  intended to replace advice given to you by your health care provider. Make sure you discuss any questions you have with your health care provider. Document Revised: 09/03/2019 Document Reviewed: 09/03/2019 Elsevier Patient Education  2021 [employer] Inc[employer]  Salpingectomy, Care After This sheet gives you information about how to care for yourself after your procedure. Your health care provider may also give you more specific instructions. If you have problems or questions, contact your health care provider. What can I expect after the procedure? After the procedure, it is common to have:  Pain in your abdomen.  Some light vaginal bleeding (spotting) for a few days.  Tiredness. Your recovery time will vary depending on which method your surgeon used for your surgery. Follow these instructions at home: Incision care  Follow instructions from your health care provider about how to take care of your incisions. Make sure you: ? Wash your hands with soap and water before and after you change your bandage (dressing). If soap and water are not available, use hand sanitizer. ? Change or remove your dressing as told by your health care provider. ? Leave any stitches (sutures), skin glue, or adhesive strips in place. These skin closures may need to stay in place for 2 weeks or longer. If adhesive strip edges start to loosen and curl up, you may trim the loose edges. Do not remove adhesive strips completely unless your health care provider tells you to do that.  Keep your dressing clean and dry.  Check your incision area every day for signs of infection. Check for: ? Redness, swelling, or pain that gets worse. ? Fluid or blood. ? Warmth. ? Pus or a bad smell.   Activity  Rest as told by your health care provider.  Avoid sitting for a long time without moving. Get up to take short walks every 1-2 hours. This is important to improve blood flow and breathing. Ask for help if you feel weak or  unsteady.  Return to your normal activities as told by your health care provider. Ask your health care provider what activities are safe for you.  Do not drive until your health care provider says that it is safe.  Do not lift anything that is heavier than 10 lb (4.5 kg), or the limit that you are told, until your health care provider says that it is safe. This may be 2-6 weeks depending on your surgery.  Until your health care provider approves: ? Do not douche. ? Do not use tampons. ? Do not have sex. Medicines  Take over-the-counter and prescription medicines only as told by your health care provider.  Ask your health care provider if the medicine prescribed to you: ? Requires you to avoid driving or using heavy machinery. ? Can cause constipation. You may need to take actions to prevent or treat constipation, such as:  Drink enough fluid to keep your urine pale yellow.  Take over-the-counter or prescription medicines.  Eat foods that are high in fiber, such as beans, whole grains, and fresh fruits and vegetables.  Limit foods that are high in fat and processed sugars, such as fried or sweet foods. General instructions  Wear compression stockings as told by your health care provider. These stockings help to prevent blood clots and reduce swelling in your legs.  Do not use any products that contain nicotine or tobacco, such as cigarettes, e-cigarettes, and chewing tobacco. If you need help quitting, ask your health care provider.  Do not take baths, swim, or use a hot tub until your health care provider approves. You may take showers.  Keep all follow-up visits as told by your health care provider. This is important. Contact a health care provider if you have:  Pain when you urinate.  Redness, swelling, or pain around an incision.  Fluid or blood coming from an incision.  Pus or a bad smell coming from an incision.  An incision that feels warm to the touch.  A  fever.  Abdominal pain that gets worse or does not get better with medicine.  An incision that starts to break open.  A rash.  Light-headedness.  Nausea and vomiting. Get help right away if you:  Have pain in your chest or leg.  Develop shortness of breath.  Faint.  Have increased or heavy vaginal bleeding, such as soaking a pad in an hour. Summary  After the procedure, it is common to feel tired, have some pain in your abdomen, and have some light vaginal bleeding for a few days.  Follow instructions from your health care provider about how to take care of your incisions.  Return to your normal activities as told by your health care provider. Ask your health care provider what activities are safe for you.  Do not douche, use tampons, or have sex until your health care provider approves.  Keep all follow-up visits as told by your health care provider. This information is not intended to replace advice given to you by your health care provider. Make sure you discuss any questions you have with your health care provider. Document Revised: 12/22/2017 Document Reviewed: 12/22/2017 Elsevier Patient Education  2021 [employer] General Anesthesia, Adult, Care After This sheet gives you information about how to care for yourself after  your procedure. Your health care provider may also give you more specific instructions. If you have problems or questions, contact your health care provider. What can I expect after the procedure? After the procedure, the following side effects are common:  Pain or discomfort at the IV site.  Nausea.  Vomiting.  Sore throat.  Trouble concentrating.  Feeling cold or chills.  Feeling weak or tired.  Sleepiness and fatigue.  Soreness and body aches. These side effects can affect parts of the body that were not involved in surgery. Follow these instructions at home: For the time period you were told by your health care  provider:  Rest.  Do not participate in activities where you could fall or become injured.  Do not drive or use machinery.  Do not drink alcohol.  Do not take sleeping pills or medicines that cause drowsiness.  Do not make important decisions or sign legal documents.  Do not take care of children on your own.   Eating and drinking  Follow any instructions from your health care provider about eating or drinking restrictions.  When you feel hungry, start by eating small amounts of foods that are soft and easy to digest (bland), such as toast. Gradually return to your regular diet.  Drink enough fluid to keep your urine pale yellow.  If you vomit, rehydrate by drinking water, juice, or clear broth. General instructions  If you have sleep apnea, surgery and certain medicines can increase your risk for breathing problems. Follow instructions from your health care provider about wearing your sleep device: ? Anytime you are sleeping, including during daytime naps. ? While taking prescription pain medicines, sleeping medicines, or medicines that make you drowsy.  Have a responsible adult stay with you for the time you are told. It is important to have someone help care for you until you are awake and alert.  Return to your normal activities as told by your health care provider. Ask your health care provider what activities are safe for you.  Take over-the-counter and prescription medicines only as told by your health care provider.  If you smoke, do not smoke without supervision.  Keep all follow-up visits as told by your health care provider. This is important. Contact a health care provider if:  You have nausea or vomiting that does not get better with medicine.  You cannot eat or drink without vomiting.  You have pain that does not get better with medicine.  You are unable to pass urine.  You develop a skin rash.  You have a fever.  You have redness around your IV site  that gets worse. Get help right away if:  You have difficulty breathing.  You have chest pain.  You have blood in your urine or stool, or you vomit blood. Summary  After the procedure, it is common to have a sore throat or nausea. It is also common to feel tired.  Have a responsible adult stay with you for the time you are told. It is important to have someone help care for you until you are awake and alert.  When you feel hungry, start by eating small amounts of foods that are soft and easy to digest (bland), such as toast. Gradually return to your regular diet.  Drink enough fluid to keep your urine pale yellow.  Return to your normal activities as told by your health care provider. Ask your health care provider what activities are safe for you. This information is not intended  to replace advice given to you by your health care provider. Make sure you discuss any questions you have with your health care provider. Document Revised: 09/16/2019 Document Reviewed: 04/15/2019 Elsevier Patient Education  2021 [employer]

## 2020-02-28 ENCOUNTER — Encounter (HOSPITAL_COMMUNITY)
Admission: RE | Admit: 2020-02-28 | Discharge: 2020-02-28 | Disposition: A | Discharge status: Home / Self Care | Payer: Medicaid Other | Source: Ambulatory Visit | Attending: Obstetrics & Gynecology | Admitting: Obstetrics & Gynecology

## 2020-02-28 ENCOUNTER — Encounter (HOSPITAL_COMMUNITY): Payer: Self-pay

## 2020-02-28 ENCOUNTER — Other Ambulatory Visit (HOSPITAL_COMMUNITY): Payer: Medicaid Other

## 2020-02-29 ENCOUNTER — Encounter (HOSPITAL_COMMUNITY): Payer: Self-pay | Admitting: Anesthesiology

## 2020-03-01 ENCOUNTER — Ambulatory Visit (HOSPITAL_COMMUNITY)
Admission: RE | Admit: 2020-03-01 | Payer: Medicaid Other | Source: Home / Self Care | Admitting: Obstetrics & Gynecology

## 2020-03-01 ENCOUNTER — Encounter (HOSPITAL_COMMUNITY): Admission: RE | Payer: Self-pay | Source: Home / Self Care

## 2020-03-01 SURGERY — HYSTERECTOMY, VAGINAL
Anesthesia: General

## 2020-03-13 ENCOUNTER — Encounter: Payer: Medicaid Other | Admitting: Obstetrics & Gynecology

## 2020-03-15 ENCOUNTER — Encounter: Payer: Self-pay | Admitting: Emergency Medicine

## 2020-03-15 ENCOUNTER — Ambulatory Visit
Admission: EM | Admit: 2020-03-15 | Discharge: 2020-03-15 | Disposition: A | Discharge status: Home / Self Care | Payer: Medicaid Other | Attending: Emergency Medicine | Admitting: Emergency Medicine

## 2020-03-15 ENCOUNTER — Other Ambulatory Visit: Payer: Self-pay

## 2020-03-15 DIAGNOSIS — H5789 Other specified disorders of eye and adnexa: Secondary | ICD-10-CM

## 2020-03-15 DIAGNOSIS — H18892 Other specified disorders of cornea, left eye: Secondary | ICD-10-CM

## 2020-03-15 MED ORDER — OFLOXACIN 0.3 % OP SOLN: 1.0000 [drp] | Freq: Four times a day (QID) | OPHTHALMIC | 0 refills | Status: DC

## 2020-03-15 NOTE — ED Triage Notes (Signed)
Left eye pain.  Wears contacts and took them out.  Eye red and irritated.

## 2020-03-15 NOTE — ED Provider Notes (Signed)
[facility]   [id number] 03/15/20 Arrival Time: 5885  CC: Red eye  SUBJECTIVE:  [name] is a 35 y.o. female who presents with a complaint of left eye redness and pain for the past few days.  Denies a precipitating event, trauma, or close contacts with similar symptoms.  Has tried OTC eye drops without relief.  Denies alleviating or aggravating factors.  Denies similar symptoms in the past.  Denies fever, chills, nausea, vomiting, eye pain, painful eye movements, halos, discharge, itching, vision changes, double vision, FB sensation, periorbital erythema.     Denies contact lens use.    ROS: As per HPI.  All other pertinent ROS negative.     Past Medical History:  Diagnosis Date  . Anemia, unspecified   . Anxiety   . Asthma   . Congestive heart failure (CHF) (HCC)   . Disc herniation    causes sciatica unsure which disc  . Esophageal reflux   . Fatty liver   . Fibromyalgia   . Gastroparesis   . Headache(784.0)   . Heart murmur   . History of narcotic addiction (HCC) 09/30/2012   2010:  Per pt, was addicted to narcotics; mom helped intervene and stopped taking opiods   . Hyperemesis gravidarum with metabolic disturbance, unspecified as to episode of care   . Irritable bowel syndrome   . MSSA bacteremia 02/20/2017  . Other and unspecified noninfectious gastroenteritis and colitis(558.9)   . Pregnant   . PTSD (post-traumatic stress disorder)   . Septic arthritis of right sternoclavicular joint (HCC) 02/20/2017  . Unspecified asthma(493.90)    Past Surgical History:  Procedure Laterality Date  . APPENDECTOMY    . CHOLECYSTECTOMY    . COLONOSCOPY    . INCISION AND DRAINAGE ABSCESS Left 04/09/2017   Procedure: INCISION AND DRAINAGE ABSCESS OF NECK;  Surgeon: Graylin Shiver, MD;  Location: [facility] OR;  Service: ENT;  Laterality: Left;  . LAPAROSCOPIC BILATERAL SALPINGECTOMY Bilateral 12/08/2012   Procedure: LAPAROSCOPIC BILATERAL SALPINGECTOMY;  Surgeon: Tilda Burrow, MD;  Location: [facility] ORS;  Service: Gynecology;  Laterality: Bilateral;  . MULTIPLE EXTRACTIONS WITH ALVEOLOPLASTY N/A 12/01/2015   Procedure: EXTRACTION TEETH TWO, THREE, FOUR, SIX, SEVEN, EIGHT, NINE, TEN, ELEVEN, TWELVE, FOURTEEN, FIFTEEN, TWENTY, TWENTY ONE, TWENTY EIGHT, TWENTY NINE, THIRTY AND THIRTY ONE WITH ALVEOLOPLASTY;  Surgeon: Ocie Doyne, DDS;  Location: [facility] OR;  Service: Oral Surgery;  Laterality: N/A;  . TEE WITHOUT CARDIOVERSION N/A 02/20/2017   Procedure: TRANSESOPHAGEAL ECHOCARDIOGRAM (TEE);  Surgeon: Laurey Morale, MD;  Location: [facility] ENDOSCOPY;  Service: Cardiovascular;  Laterality: N/A;  . TEE WITHOUT CARDIOVERSION N/A 04/11/2017   Procedure: TRANSESOPHAGEAL ECHOCARDIOGRAM (TEE);  Surgeon: Orpah Cobb, MD;  Location: [facility] ENDOSCOPY;  Service: Cardiovascular;  Laterality: N/A;  . TUBAL LIGATION     Allergies  Allergen Reactions  . Azithromycin Nausea And Vomiting    ABDOMINAL PAIN  . Latex Itching  . Nsaids     UNSPECIFIED REACTION    . Hydrocodone Nausea And Vomiting  . Penicillins Nausea And Vomiting     Has patient had a PCN reaction causing immediate rash, facial/tongue/throat swelling, SOB or lightheadedness with hypotension: No Has patient had a PCN reaction causing severe rash involving mucus membranes or skin necrosis: No Has patient had a PCN reaction that required hospitalization No Has patient had a PCN reaction occurring within the last 10 years: No If all of the above answers are "NO", then may proceed with Cephalosporin use.   . Prednisone  Nausea Only and Other (See Comments)    Can take shot, but pill form "caused stomach pain , nausea"   No current facility-administered medications on file prior to encounter.   Current Outpatient Medications on File Prior to Encounter  Medication Sig Dispense Refill  . Buprenorphine HCl-Naloxone HCl (SUBOXONE) 8-2 MG FILM Place 2 Film under the tongue daily.     . busPIRone (BUSPAR) 15 MG tablet TAKE 2  TABLETS BY MOUTHCTWICE DAILY.    . ferrous sulfate 325 (65 FE) MG tablet Take 1 tablet (325 mg total) by mouth 3 (three) times daily with meals. (Patient taking differently: Take 325 mg by mouth daily with breakfast. ) 90 tablet 3  . magnesium 30 MG tablet Take 1 tablet (30 mg total) by mouth daily. (Patient not taking: Reported on 10/22/2019) 5 tablet 0  . potassium chloride (KLOR-CON) 10 MEQ tablet Take 1 tablet (10 mEq total) by mouth 2 (two) times daily. 10 tablet 0   Social History   Socioeconomic History  . Marital status: Single    Spouse name: Not on file  . Number of children: 2  . Years of education: Not on file  . Highest education level: Not on file  Occupational History  . Occupation: CNA  Tobacco Use  . Smoking status: Current Every Day Smoker    Packs/day: 1.50    Years: 10.00    Pack years: 15.00    Types: Cigarettes  . Smokeless tobacco: Never Used  Vaping Use  . Vaping Use: Never used  Substance and Sexual Activity  . Alcohol use: Yes    Comment: on weekends  . Drug use: No    Types: IV    Comment: pt denies 11/30/15  . Sexual activity: Yes    Birth control/protection: Surgical    Comment: tubal  Other Topics Concern  . Not on file  Social History Narrative   Daily caffeine    Social Determinants of Health   Financial Resource Strain: Low Risk   . Difficulty of Paying Living Expenses: Not hard at all  Food Insecurity: No Food Insecurity  . Worried About Programme researcher, broadcasting/film/video in the Last Year: Never true  . Ran Out of Food in the Last Year: Never true  Transportation Needs: No Transportation Needs  . Lack of Transportation (Medical): No  . Lack of Transportation (Non-Medical): No  Physical Activity: Insufficiently Active  . Days of Exercise per Week: 2 days  . Minutes of Exercise per Session: 30 min  Stress: Stress Concern Present  . Feeling of Stress : Very much  Social Connections: Moderately Isolated  . Frequency of Communication with Friends and  Family: More than three times a week  . Frequency of Social Gatherings with Friends and Family: Once a week  . Attends Religious Services: More than 4 times per year  . Active Member of Clubs or Organizations: No  . Attends Banker Meetings: Never  . Marital Status: Never married  Intimate Partner Violence: Not At Risk  . Fear of Current or Ex-Partner: No  . Emotionally Abused: No  . Physically Abused: No  . Sexually Abused: No   Family History  Problem Relation Age of Onset  . Ulcerative colitis Paternal Grandmother   . Cancer Paternal Grandmother   . Colon polyps Paternal Grandfather   . Cancer Paternal Grandfather        thyroid  . Diabetes Father   . Heart disease Father   . Kidney disease Father   .  Hypertension Father     OBJECTIVE:    Visual Acuity  Right Eye Distance:   Left Eye Distance:   Bilateral Distance:    Right Eye Near:   Left Eye Near:    Bilateral Near:      Vitals:   03/15/20 0903  BP: 106/66  Pulse: 78  Resp: 18  Temp: 98 F (36.7 C)  TempSrc: Oral  SpO2: 95%    Physical Exam Vitals and nursing note reviewed.  Constitutional:      General: She is not in acute distress.    Appearance: Normal appearance. She is normal weight. She is not ill-appearing, toxic-appearing or diaphoretic.  HENT:     Head: Normocephalic.  Eyes:     General: Lids are normal. Lids are everted, no foreign bodies appreciated. Vision grossly intact. Gaze aligned appropriately.        Left eye: Discharge present.No foreign body or hordeolum.     Conjunctiva/sclera:     Left eye: Left conjunctiva is not injected. No exudate.    Comments: Eye redness with clear discharge  Cardiovascular:     Rate and Rhythm: Normal rate and regular rhythm.     Pulses: Normal pulses.     Heart sounds: Normal heart sounds. No murmur heard. No friction rub. No gallop.   Pulmonary:     Effort: Pulmonary effort is normal. No respiratory distress.     Breath sounds: Normal  breath sounds. No stridor. No wheezing, rhonchi or rales.  Chest:     Chest wall: No tenderness.  Neurological:     Mental Status: She is alert and oriented to person, place, and time.      ASSESSMENT & PLAN:  1. Corneal irritation of left eye   2. Eye redness     Meds ordered this encounter  Medications  . ofloxacin (OCUFLOX) 0.3 % ophthalmic solution    Sig: Place 1 drop into the left eye 4 (four) times daily.    Dispense:  5 mL    Refill:  0     Discharge instructions  Use ofloxacin as prescribed and to completion Use OTC systane or genteal gel eye drops at night as needed for symptomatic relief Use OTC ibuprofen or tylenol as needed for pain relief Return here or follow up with ophthamolgy if symptoms persists or worsen such as fever, chills, redness, swelling, eye pain, painful eye movements, vision changes, etc...   Reviewed expectations re: course of current medical issues. Questions answered. Outlined signs and symptoms indicating need for more acute intervention. Patient verbalized understanding. After Visit Summary given.   Durward Parcel, FNP 03/15/20 [phone]

## 2020-03-15 NOTE — Discharge Instructions (Addendum)
Use ofloxacin as prescribed and to completion Use OTC systane or genteal gel eye drops at night as needed for symptomatic relief Use OTC ibuprofen or tylenol as needed for pain relief Return here or follow up with ophthamolgy if symptoms persists or worsen such as fever, chills, redness, swelling, eye pain, painful eye movements, vision changes, etc..... 

## 2020-07-10 ENCOUNTER — Ambulatory Visit
Admission: EM | Admit: 2020-07-10 | Discharge: 2020-07-10 | Disposition: A | Discharge status: Home / Self Care | Payer: Medicaid Other

## 2020-07-10 ENCOUNTER — Other Ambulatory Visit: Payer: Self-pay

## 2020-07-10 ENCOUNTER — Ambulatory Visit (INDEPENDENT_AMBULATORY_CARE_PROVIDER_SITE_OTHER): Payer: Medicaid Other

## 2020-07-10 DIAGNOSIS — J22 Unspecified acute lower respiratory infection: Secondary | ICD-10-CM | POA: Diagnosis not present

## 2020-07-10 DIAGNOSIS — R0602 Shortness of breath: Secondary | ICD-10-CM | POA: Diagnosis not present

## 2020-07-10 DIAGNOSIS — R059 Cough, unspecified: Secondary | ICD-10-CM | POA: Diagnosis not present

## 2020-07-10 DIAGNOSIS — Z1152 Encounter for screening for COVID-19: Secondary | ICD-10-CM | POA: Diagnosis not present

## 2020-07-10 DIAGNOSIS — J4521 Mild intermittent asthma with (acute) exacerbation: Secondary | ICD-10-CM

## 2020-07-10 MED ORDER — PROMETHAZINE-DM 6.25-15 MG/5ML PO SYRP: 5.0000 mL | ORAL_SOLUTION | Freq: Four times a day (QID) | ORAL | 0 refills | Status: DC | PRN

## 2020-07-10 MED ORDER — ALBUTEROL SULFATE HFA 108 (90 BASE) MCG/ACT IN AERS
2.0000 | INHALATION_SPRAY | Freq: Once | RESPIRATORY_TRACT | Status: AC
  Administered 2020-07-10: 11:00:00 2 via RESPIRATORY_TRACT

## 2020-07-10 MED ORDER — DOXYCYCLINE HYCLATE 100 MG PO CAPS: 100.0000 mg | ORAL_CAPSULE | Freq: Two times a day (BID) | ORAL | 0 refills | Status: AC

## 2020-07-10 MED ORDER — PREDNISONE 20 MG PO TABS: 20.0000 mg | ORAL_TABLET | Freq: Every day | ORAL | 0 refills | Status: AC

## 2020-07-10 NOTE — Discharge Instructions (Addendum)
Your chest x-ray is negative for pneumonia. Treating you for a lower respiratory infection with doxycycline along with prednisone 20 mg once daily for 5 days-take with food to avoid abdominal upset.  Promethazine DM for cough. COVID/flu test pending to rule out an active viral infection as underlying source of your symptoms today. COVID test should result within 3 to 4 days.  Your results will be available to review on MyChart.  Continue albuterol inhaler 2 puffs every 4-6 hours as needed for any shortness of breath or wheezing.If your breathing worsens recommend going immediately to the emergency department.

## 2020-07-10 NOTE — ED Provider Notes (Signed)
[facility]    CSN: [id number] Arrival date & time: 07/10/20  1022      History   Chief Complaint Chief Complaint  Patient presents with   Nasal Congestion    HPI [name] is a 35 y.o. female.   HPI Patient with a known history of CHF and asthma (well-controlled asthma) and a current smoker presents today with 2-week history of upper nasal and sinus congestion symptoms.  Within the last week however patient has developed a persistent cough with body aches and associated shortness of breath along with wheezing.  Patient took a home COVID test 2 weeks ago however has not repeated the test since symptoms have worsened.  She took a family members antibiotics without any improvement of symptoms.  On arrival patient's oxygen level is 92% and she has some active wheezing.    Past Medical History:  Diagnosis Date   Anemia, unspecified    Anxiety    Asthma    Congestive heart failure (CHF) (HCC)    Disc herniation    causes sciatica unsure which disc   Esophageal reflux    Fatty liver    Fibromyalgia    Gastroparesis    Headache(784.0)    Heart murmur    History of narcotic addiction (HCC) 09/30/2012   2010:  Per pt, was addicted to narcotics; mom helped intervene and stopped taking opiods    Hyperemesis gravidarum with metabolic disturbance, unspecified as to episode of care    Irritable bowel syndrome    MSSA bacteremia 02/20/2017   Other and unspecified noninfectious gastroenteritis and colitis(558.9)    Pregnant    PTSD (post-traumatic stress disorder)    Septic arthritis of right sternoclavicular joint (HCC) 02/20/2017   Unspecified asthma(493.90)     Patient Active Problem List   Diagnosis Date Noted   Bilateral leg edema 02/05/2018   Pancytopenia (HCC)    Blood per rectum    Hematuria    Chronic diastolic CHF (congestive heart failure) (HCC) 03/07/2017   PFO (patent foramen ovale)    Endocarditis of tricuspid valve    Weakness of both lower  extremities    Injection of illicit drug within last 12 months    Fibromyalgia 03/19/2012   GERD 02/02/2009   Irritable bowel syndrome 02/02/2009   COLITIS 04/29/2008   ANXIETY 04/28/2008   ASTHMA 04/28/2008   Gastroparesis 04/28/2008    Past Surgical History:  Procedure Laterality Date   APPENDECTOMY     CHOLECYSTECTOMY     COLONOSCOPY     INCISION AND DRAINAGE ABSCESS Left 04/09/2017   Procedure: INCISION AND DRAINAGE ABSCESS OF NECK;  Surgeon: Graylin Shiver, MD;  Location: [facility] OR;  Service: ENT;  Laterality: Left;   LAPAROSCOPIC BILATERAL SALPINGECTOMY Bilateral 12/08/2012   Procedure: LAPAROSCOPIC BILATERAL SALPINGECTOMY;  Surgeon: Tilda Burrow, MD;  Location: [facility] ORS;  Service: Gynecology;  Laterality: Bilateral;   MULTIPLE EXTRACTIONS WITH ALVEOLOPLASTY N/A 12/01/2015   Procedure: EXTRACTION TEETH TWO, THREE, FOUR, SIX, SEVEN, EIGHT, NINE, TEN, ELEVEN, TWELVE, FOURTEEN, FIFTEEN, TWENTY, TWENTY ONE, TWENTY EIGHT, TWENTY NINE, THIRTY AND THIRTY ONE WITH ALVEOLOPLASTY;  Surgeon: Ocie Doyne, DDS;  Location: [facility] OR;  Service: Oral Surgery;  Laterality: N/A;   TEE WITHOUT CARDIOVERSION N/A 02/20/2017   Procedure: TRANSESOPHAGEAL ECHOCARDIOGRAM (TEE);  Surgeon: Laurey Morale, MD;  Location: [facility] ENDOSCOPY;  Service: Cardiovascular;  Laterality: N/A;   TEE WITHOUT CARDIOVERSION N/A 04/11/2017   Procedure: TRANSESOPHAGEAL ECHOCARDIOGRAM (TEE);  Surgeon: Orpah Cobb, MD;  Location: [facility]  ENDOSCOPY;  Service: Cardiovascular;  Laterality: N/A;   TUBAL LIGATION      OB History     Gravida  3   Para  3   Term  3   Preterm      AB      Living  3      SAB      IAB      Ectopic      Multiple      Live Births  3            Home Medications    Prior to Admission medications   Medication Sig Start Date End Date Taking? Authorizing Provider  Buprenorphine HCl-Naloxone HCl 8-2 MG FILM Place 2 Film under the tongue daily.    Yes [provider]   busPIRone (BUSPAR) 15 MG tablet TAKE 2 TABLETS BY MOUTHCTWICE DAILY. 08/26/19  Yes [provider]  citalopram (CELEXA) 20 MG tablet Take 20 mg by mouth daily. Take 1.5 tablets daily   Yes [provider]  ferrous sulfate 325 (65 FE) MG tablet Take 1 tablet (325 mg total) by mouth 3 (three) times daily with meals. Patient taking differently: Take 325 mg by mouth daily with breakfast. 04/15/17   Rhetta Mura, MD  magnesium 30 MG tablet Take 1 tablet (30 mg total) by mouth daily. Patient not taking: No sig reported 04/18/19   Linwood Dibbles, MD  ofloxacin (OCUFLOX) 0.3 % ophthalmic solution Place 1 drop into the left eye 4 (four) times daily. 03/15/20   Avegno, Zachery Dakins, FNP  potassium chloride (KLOR-CON) 10 MEQ tablet Take 1 tablet (10 mEq total) by mouth 2 (two) times daily. 04/18/19   Linwood Dibbles, MD    Family History Family History  Problem Relation Age of Onset   Ulcerative colitis Paternal Grandmother    Cancer Paternal Grandmother    Colon polyps Paternal Grandfather    Cancer Paternal Grandfather        thyroid   Diabetes Father    Heart disease Father    Kidney disease Father    Hypertension Father     Social History Social History   Tobacco Use   Smoking status: Every Day    Packs/day: 1.50    Years: 10.00    Pack years: 15.00    Types: Cigarettes   Smokeless tobacco: Never  Vaping Use   Vaping Use: Never used  Substance Use Topics   Alcohol use: Not Currently    Comment: on weekends   Drug use: No    Types: IV    Comment: pt denies 11/30/15     Allergies   Azithromycin, Latex, Nsaids, Hydrocodone, Penicillins, and Prednisone   Review of Systems Review of Systems Pertinent negatives listed in HPI   Physical Exam Triage Vital Signs ED Triage Vitals [07/10/20 1058]  Enc Vitals Group     BP      Pulse      Resp      Temp      Temp src      SpO2      Weight      Height      Head Circumference      Peak Flow      Pain Score 0      Pain Loc      Pain Edu?      Excl. in GC?    No data found.  Updated Vital Signs LMP 06/26/2020 (Exact Date)   Visual Acuity Right  Eye Distance:   Left Eye Distance:   Bilateral Distance:    Right Eye Near:   Left Eye Near:    Bilateral Near:     Physical Exam  General Appearance:    Alert, cooperative, no distress  HENT:   Normocephalic, ears normal, nares mucosal edema with congestion, rhinorrhea, oropharynx clear  Eyes:    PERRL, conjunctiva/corneas clear, EOM's intact       Lungs:   Wheezing and rhonchi noted on exam.  Bilateral lower lobe rales noted on exam.  Respirations even, nonlabored   Heart:    Regular rate and rhythm  Neurologic:   Awake, alert, oriented x 3. No apparent focal neurological    defect.      UC Treatments / Results  Labs (all labs ordered are listed, but only abnormal results are displayed) Labs Reviewed - No data to display  EKG   Radiology No results found.  Procedures Procedures (including critical care time)  Medications Ordered in UC Medications - No data to display  Initial Impression / Assessment and Plan / UC Course  I have reviewed the triage vital signs and the nursing notes.  Pertinent labs & imaging results that were available during my care of the patient were reviewed by me and considered in my medical decision making (see chart for details).    COVID/Flu test pending. Symptom management warranted only.    Treatment per discharge medications/discharge instructions.  Red flags/ER precautions given. The most current CDC isolation/quarantine recommendation advised.   Final Clinical Impressions(s) / UC Diagnoses   Final diagnoses:  Encounter for screening for COVID-19  Lower respiratory infection (e.g., bronchitis, pneumonia, pneumonitis, pulmonitis)  Intermittent asthma with acute exacerbation, unspecified asthma severity     Discharge Instructions      Your chest x-ray is negative for pneumonia. Treating you for a  lower respiratory infection with doxycycline along with prednisone 20 mg once daily for 5 days-take with food to avoid abdominal upset.  Promethazine DM for cough. COVID/flu test pending to rule out an active viral infection as underlying source of your symptoms today. COVID test should result within 3 to 4 days.  Your results will be available to review on MyChart.  Continue albuterol inhaler 2 puffs every 4-6 hours as needed for any shortness of breath or wheezing.If your breathing worsens recommend going immediately to the emergency department.      ED Prescriptions     Medication Sig Dispense Auth. Provider   doxycycline (VIBRAMYCIN) 100 MG capsule Take 1 capsule (100 mg total) by mouth 2 (two) times daily for 7 days. 14 capsule Bing Neighbors, FNP   promethazine-dextromethorphan (PROMETHAZINE-DM) 6.25-15 MG/5ML syrup Take 5 mLs by mouth 4 (four) times daily as needed for cough. 140 mL Bing Neighbors, FNP   predniSONE (DELTASONE) 20 MG tablet Take 1 tablet (20 mg total) by mouth daily with breakfast for 5 days. 5 tablet Bing Neighbors, FNP      PDMP not reviewed this encounter.   Jozette, Castrellon, FNP 07/10/20 1210

## 2020-07-10 NOTE — ED Triage Notes (Signed)
Pt presents with body aches, cough worse at night, congestion, HA intermittent x 3 weeks.  Took some amoxicillin from a family member and felt better but it returned.  Doesn't think she has fever.  Home COVID test negative. No longer has her inhaler. No SOB.

## 2020-07-11 LAB — COVID-19, FLU A+B NAA
Influenza A, NAA: NOT DETECTED
Influenza B, NAA: NOT DETECTED
SARS-CoV-2, NAA: NOT DETECTED

## 2020-09-15 ENCOUNTER — Ambulatory Visit
Admission: EM | Admit: 2020-09-15 | Discharge: 2020-09-15 | Disposition: A | Discharge status: Home / Self Care | Payer: Medicaid Other | Attending: Emergency Medicine | Admitting: Emergency Medicine

## 2020-09-15 DIAGNOSIS — J069 Acute upper respiratory infection, unspecified: Secondary | ICD-10-CM

## 2020-09-15 DIAGNOSIS — Z1152 Encounter for screening for COVID-19: Secondary | ICD-10-CM

## 2020-09-15 MED ORDER — MELOXICAM 15 MG PO TABS: 15.0000 mg | ORAL_TABLET | Freq: Every day | ORAL | 0 refills | Status: DC

## 2020-09-15 NOTE — ED Triage Notes (Signed)
Patient presents to Urgent Care with complaints of fever, body aches, headache, chills, nasal congestion and cough since Thursday. Treating symptoms with sinus cold meds.

## 2020-09-15 NOTE — ED Provider Notes (Signed)
[facility]   [id number] 09/15/20 Arrival Time: 0903   CC: COVID symptoms  SUBJECTIVE: History from: patient.  [name] is a 35 y.o. female who presents with fever, body aches, headache, chills, nasal congestion, and cough x 1 day  Denies known sick exposure to COVID, flu or strep.  Has tried OTC medications without relief.  Denies aggravating factors.  Denies previous covid infection in the past.   Denies SOB, wheezing, chest pain, nausea, changes in bowel or bladder habits.    ROS: As per HPI.  All other pertinent ROS negative.     Past Medical History:  Diagnosis Date   Anemia, unspecified    Anxiety    Asthma    Congestive heart failure (CHF) (HCC)    Disc herniation    causes sciatica unsure which disc   Esophageal reflux    Fatty liver    Fibromyalgia    Gastroparesis    Headache(784.0)    Heart murmur    History of narcotic addiction (HCC) 09/30/2012   2010:  Per pt, was addicted to narcotics; mom helped intervene and stopped taking opiods    Hyperemesis gravidarum with metabolic disturbance, unspecified as to episode of care    Irritable bowel syndrome    MSSA bacteremia 02/20/2017   Other and unspecified noninfectious gastroenteritis and colitis(558.9)    Pregnant    PTSD (post-traumatic stress disorder)    Septic arthritis of right sternoclavicular joint (HCC) 02/20/2017   Unspecified asthma(493.90)    Past Surgical History:  Procedure Laterality Date   APPENDECTOMY     CHOLECYSTECTOMY     COLONOSCOPY     INCISION AND DRAINAGE ABSCESS Left 04/09/2017   Procedure: INCISION AND DRAINAGE ABSCESS OF NECK;  Surgeon: Graylin Shiver, MD;  Location: [facility] OR;  Service: ENT;  Laterality: Left;   LAPAROSCOPIC BILATERAL SALPINGECTOMY Bilateral 12/08/2012   Procedure: LAPAROSCOPIC BILATERAL SALPINGECTOMY;  Surgeon: Tilda Burrow, MD;  Location: [facility] ORS;  Service: Gynecology;  Laterality: Bilateral;   MULTIPLE EXTRACTIONS WITH ALVEOLOPLASTY N/A 12/01/2015    Procedure: EXTRACTION TEETH TWO, THREE, FOUR, SIX, SEVEN, EIGHT, NINE, TEN, ELEVEN, TWELVE, FOURTEEN, FIFTEEN, TWENTY, TWENTY ONE, TWENTY EIGHT, TWENTY NINE, THIRTY AND THIRTY ONE WITH ALVEOLOPLASTY;  Surgeon: Ocie Doyne, DDS;  Location: [facility] OR;  Service: Oral Surgery;  Laterality: N/A;   TEE WITHOUT CARDIOVERSION N/A 02/20/2017   Procedure: TRANSESOPHAGEAL ECHOCARDIOGRAM (TEE);  Surgeon: Laurey Morale, MD;  Location: [facility] ENDOSCOPY;  Service: Cardiovascular;  Laterality: N/A;   TEE WITHOUT CARDIOVERSION N/A 04/11/2017   Procedure: TRANSESOPHAGEAL ECHOCARDIOGRAM (TEE);  Surgeon: Orpah Cobb, MD;  Location: [facility] ENDOSCOPY;  Service: Cardiovascular;  Laterality: N/A;   TUBAL LIGATION     Allergies  Allergen Reactions   Azithromycin Nausea And Vomiting    ABDOMINAL PAIN   Latex Itching   Nsaids     UNSPECIFIED REACTION     Hydrocodone Nausea And Vomiting   Penicillins Nausea And Vomiting     Has patient had a PCN reaction causing immediate rash, facial/tongue/throat swelling, SOB or lightheadedness with hypotension: No Has patient had a PCN reaction causing severe rash involving mucus membranes or skin necrosis: No Has patient had a PCN reaction that required hospitalization No Has patient had a PCN reaction occurring within the last 10 years: No If all of the above answers are "NO", then may proceed with Cephalosporin use.    Prednisone Nausea Only and Other (See Comments)    Can take shot, but pill form "caused stomach  pain , nausea"   No current facility-administered medications on file prior to encounter.   Current Outpatient Medications on File Prior to Encounter  Medication Sig Dispense Refill   Buprenorphine HCl-Naloxone HCl 8-2 MG FILM Place 2 Film under the tongue daily.      busPIRone (BUSPAR) 15 MG tablet TAKE 2 TABLETS BY MOUTHCTWICE DAILY.     ferrous sulfate 325 (65 FE) MG tablet Take 1 tablet (325 mg total) by mouth 3 (three) times daily with meals. (Patient taking  differently: Take 325 mg by mouth daily with breakfast.) 90 tablet 3   magnesium 30 MG tablet Take 1 tablet (30 mg total) by mouth daily. (Patient not taking: No sig reported) 5 tablet 0   ofloxacin (OCUFLOX) 0.3 % ophthalmic solution Place 1 drop into the left eye 4 (four) times daily. 5 mL 0   potassium chloride (KLOR-CON) 10 MEQ tablet Take 1 tablet (10 mEq total) by mouth 2 (two) times daily. 10 tablet 0   Social History   Socioeconomic History   Marital status: Single    Spouse name: Not on file   Number of children: 2   Years of education: Not on file   Highest education level: Not on file  Occupational History   Occupation: CNA  Tobacco Use   Smoking status: Every Day    Packs/day: 1.50    Years: 10.00    Pack years: 15.00    Types: Cigarettes   Smokeless tobacco: Never  Vaping Use   Vaping Use: Never used  Substance and Sexual Activity   Alcohol use: Not Currently    Comment: on weekends   Drug use: No    Types: IV    Comment: pt denies 11/30/15   Sexual activity: Yes    Birth control/protection: Surgical    Comment: tubal  Other Topics Concern   Not on file  Social History Narrative   Daily caffeine    Social Determinants of Health   Financial Resource Strain: Low Risk    Difficulty of Paying Living Expenses: Not hard at all  Food Insecurity: No Food Insecurity   Worried About Programme researcher, broadcasting/film/video in the Last Year: Never true   Ran Out of Food in the Last Year: Never true  Transportation Needs: No Transportation Needs   Lack of Transportation (Medical): No   Lack of Transportation (Non-Medical): No  Physical Activity: Insufficiently Active   Days of Exercise per Week: 2 days   Minutes of Exercise per Session: 30 min  Stress: Stress Concern Present   Feeling of Stress : Very much  Social Connections: Moderately Isolated   Frequency of Communication with Friends and Family: More than three times a week   Frequency of Social Gatherings with Friends and  Family: Once a week   Attends Religious Services: More than 4 times per year   Active Member of [employer] or Organizations: No   Attends Engineer, structural: Never   Marital Status: Never married  Catering manager Violence: Not At Risk   Fear of Current or Ex-Partner: No   Emotionally Abused: No   Physically Abused: No   Sexually Abused: No   Family History  Problem Relation Age of Onset   Ulcerative colitis Paternal Grandmother    Cancer Paternal Grandmother    Colon polyps Paternal Grandfather    Cancer Paternal Grandfather        thyroid   Diabetes Father    Heart disease Father    Kidney disease  Father    Hypertension Father     OBJECTIVE:  Vitals:   09/15/20 0911  BP: 129/84  Pulse: 85  Resp: 16  Temp: 99.4 F (37.4 C)  TempSrc: Oral  SpO2: 98%     General appearance: alert; appears fatigued, but nontoxic; speaking in full sentences and tolerating own secretions HEENT: NCAT; Ears: EACs clear, TMs pearly gray; Eyes: PERRL.  EOM grossly intact. Nose: nares patent without rhinorrhea, Throat: oropharynx clear, tonsils non erythematous or enlarged, uvula midline  Neck: supple without LAD Lungs: unlabored respirations, symmetrical air entry; cough: mild; no respiratory distress; CTAB Heart: regular rate and rhythm.   Skin: warm and dry Psychological: alert and cooperative; normal mood and affect   ASSESSMENT & PLAN:  1. Encounter for screening for COVID-19   2. Viral URI with cough     Meds ordered this encounter  Medications   meloxicam (MOBIC) 15 MG tablet    Sig: Take 1 tablet (15 mg total) by mouth daily.    Dispense:  20 tablet    Refill:  0    Order Specific Question:   Supervising Provider    Answer:   Eustace Moore [[id number]]   COVID testing ordered.  It will take between 5-7 days for test results.  Someone will contact you regarding abnormal results.    In the meantime: You should remain isolated in your home for 5 days from symptom  onset AND greater than 72 hours after symptoms resolution (absence of fever without the use of fever-reducing medication and improvement in respiratory symptoms), whichever is longer Get plenty of rest and push fluids Mobic prescribed for body aches and chills Use OTC zyrtec for nasal congestion, runny nose, and/or sore throat Use OTC flonase for nasal congestion and runny nose Use medications daily for symptom relief Use OTC medications like ibuprofen or tylenol as needed fever or pain Call or go to the ED if you have any new or worsening symptoms such as fever, worsening cough, shortness of breath, chest tightness, chest pain, turning blue, changes in mental status, etc...   Reviewed expectations re: course of current medical issues. Questions answered. Outlined signs and symptoms indicating need for more acute intervention. Patient verbalized understanding. After Visit Summary given.          Rennis Harding, PA-C 09/15/20 [phone]

## 2020-09-15 NOTE — Discharge Instructions (Signed)
COVID testing ordered.  It will take between 5-7 days for test results.  Someone will contact you regarding abnormal results.    In the meantime: You should remain isolated in your home for 5 days from symptom onset AND greater than 72 hours after symptoms resolution (absence of fever without the use of fever-reducing medication and improvement in respiratory symptoms), whichever is longer Get plenty of rest and push fluids Mobic prescribed for body aches and chills Use OTC zyrtec for nasal congestion, runny nose, and/or sore throat Use OTC flonase for nasal congestion and runny nose Use medications daily for symptom relief Use OTC medications like ibuprofen or tylenol as needed fever or pain Call or go to the ED if you have any new or worsening symptoms such as fever, worsening cough, shortness of breath, chest tightness, chest pain, turning blue, changes in mental status, etc..Marland Kitchen

## 2020-09-16 LAB — SARS-COV-2, NAA 2 DAY TAT

## 2020-09-16 LAB — NOVEL CORONAVIRUS, NAA: SARS-CoV-2, NAA: DETECTED — AB

## 2020-09-20 ENCOUNTER — Other Ambulatory Visit: Payer: Self-pay

## 2020-09-20 ENCOUNTER — Ambulatory Visit
Admission: EM | Admit: 2020-09-20 | Discharge: 2020-09-20 | Disposition: A | Discharge status: Home / Self Care | Payer: Medicaid Other | Attending: Family Medicine | Admitting: Family Medicine

## 2020-09-20 ENCOUNTER — Ambulatory Visit (INDEPENDENT_AMBULATORY_CARE_PROVIDER_SITE_OTHER): Payer: Medicaid Other

## 2020-09-20 DIAGNOSIS — U071 COVID-19: Secondary | ICD-10-CM

## 2020-09-20 DIAGNOSIS — J4541 Moderate persistent asthma with (acute) exacerbation: Secondary | ICD-10-CM

## 2020-09-20 DIAGNOSIS — R0602 Shortness of breath: Secondary | ICD-10-CM

## 2020-09-20 DIAGNOSIS — R059 Cough, unspecified: Secondary | ICD-10-CM | POA: Diagnosis not present

## 2020-09-20 MED ORDER — ONDANSETRON 4 MG PO TBDP: 4.0000 mg | ORAL_TABLET | Freq: Three times a day (TID) | ORAL | 0 refills | Status: DC | PRN

## 2020-09-20 MED ORDER — BENZONATATE 100 MG PO CAPS: ORAL_CAPSULE | ORAL | 0 refills | Status: DC

## 2020-09-20 MED ORDER — ALBUTEROL SULFATE HFA 108 (90 BASE) MCG/ACT IN AERS: 1.0000 | INHALATION_SPRAY | Freq: Four times a day (QID) | RESPIRATORY_TRACT | 1 refills | Status: DC | PRN

## 2020-09-20 MED ORDER — PREDNISONE 20 MG PO TABS: 40.0000 mg | ORAL_TABLET | Freq: Every day | ORAL | 0 refills | Status: DC

## 2020-09-20 NOTE — ED Provider Notes (Signed)
[facility]ENTER   [id number] 09/20/20 Arrival Time: 0820  ASSESSMENT & PLAN:  1. COVID-19 virus infection   2. SOB (shortness of breath)   3. Moderate persistent asthma with acute exacerbation    I have personally viewed the imaging studies ordered this visit. No acute changes on CXR. No signs of PNA. Discussed.  Meds ordered this encounter  Medications   predniSONE (DELTASONE) 20 MG tablet    Sig: Take 2 tablets (40 mg total) by mouth daily.    Dispense:  10 tablet    Refill:  0   albuterol (VENTOLIN HFA) 108 (90 Base) MCG/ACT inhaler    Sig: Inhale 1-2 puffs into the lungs every 6 (six) hours as needed for wheezing or shortness of breath.    Dispense:  1 each    Refill:  1   ondansetron (ZOFRAN-ODT) 4 MG disintegrating tablet    Sig: Take 1 tablet (4 mg total) by mouth every 8 (eight) hours as needed for nausea or vomiting.    Dispense:  15 tablet    Refill:  0   benzonatate (TESSALON) 100 MG capsule    Sig: Take 1 capsule by mouth every 8 (eight) hours for cough.    Dispense:  21 capsule    Refill:  0   OTC symptom care as needed.  Recommend:  Follow-up Information     [facility] EMERGENCY DEPARTMENT.   Specialty: Emergency Medicine Why: If symptoms worsen in any way. Contact information: [address] [id number] mc [place] [address] [phone]                Reviewed expectations re: course of current medical issues. Questions answered. Outlined signs and symptoms indicating need for more acute intervention. Patient verbalized understanding. After Visit Summary given.  SUBJECTIVE: History from: patient. Covid test + this week. [name] is a 35 y.o. female who presents with complaint of persistent cough with SOB at times. No CP. Wheezing. Afebrile. Fatigued. OTC without relief.  Social History   Tobacco Use  Smoking Status Every Day   Packs/day: 1.50   Years: 10.00   Pack years: 15.00    Types: Cigarettes  Smokeless Tobacco Never   OBJECTIVE:  Vitals:   09/20/20 0853  BP: 133/80  Pulse: 81  Resp: 16  Temp: 98.6 F (37 C)  TempSrc: Oral  SpO2: 96%    General appearance: alert; NAD HEENT: [place]; AT; with nasal congestion Neck: supple without LAD Lungs: unlabored respirations, moderate bilateral inspiratory and expiratory wheezing; cough: mild; no significant respiratory distress Skin: warm and dry Psychological: alert and cooperative; normal mood and affect  Imaging: DG Chest 2 View  Result Date: 09/20/2020 CLINICAL DATA:  Cough and shortness of breath. COVID positive. EXAM: CHEST - 2 VIEW COMPARISON:  Chest x-ray 07/10/2020 FINDINGS: The cardiac silhouette, mediastinal and hilar contours are within normal limits. Prominent interstitial markings and post pneumonic scarring type changes. No acute infiltrates, pulmonary lesions or pleural effusions. No pneumothorax. The bony thorax is intact. IMPRESSION: Stable prominent interstitial markings and post pneumonic scarring type changes. No definite acute superimposed pulmonary process. Electronically Signed   By: Rudie Meyer M.D.   On: 09/20/2020 09:31    Allergies  Allergen Reactions   Azithromycin Nausea And Vomiting    ABDOMINAL PAIN   Latex Itching   Nsaids     UNSPECIFIED REACTION     Hydrocodone Nausea And Vomiting   Penicillins Nausea And Vomiting  Has patient had a PCN reaction causing immediate rash, facial/tongue/throat swelling, SOB or lightheadedness with hypotension: No Has patient had a PCN reaction causing severe rash involving mucus membranes or skin necrosis: No Has patient had a PCN reaction that required hospitalization No Has patient had a PCN reaction occurring within the last 10 years: No If all of the above answers are "NO", then may proceed with Cephalosporin use.    Prednisone Nausea Only and Other (See Comments)    Can take shot, but pill form "caused stomach pain , nausea"     Past Medical History:  Diagnosis Date   Anemia, unspecified    Anxiety    Asthma    Congestive heart failure (CHF) (HCC)    Disc herniation    causes sciatica unsure which disc   Esophageal reflux    Fatty liver    Fibromyalgia    Gastroparesis    Headache(784.0)    Heart murmur    History of narcotic addiction (HCC) 09/30/2012   2010:  Per pt, was addicted to narcotics; mom helped intervene and stopped taking opiods    Hyperemesis gravidarum with metabolic disturbance, unspecified as to episode of care    Irritable bowel syndrome    MSSA bacteremia 02/20/2017   Other and unspecified noninfectious gastroenteritis and colitis(558.9)    Pregnant    PTSD (post-traumatic stress disorder)    Septic arthritis of right sternoclavicular joint (HCC) 02/20/2017   Unspecified asthma(493.90)    Family History  Problem Relation Age of Onset   Ulcerative colitis Paternal Grandmother    Cancer Paternal Grandmother    Colon polyps Paternal Grandfather    Cancer Paternal Grandfather        thyroid   Diabetes Father    Heart disease Father    Kidney disease Father    Hypertension Father    Social History   Socioeconomic History   Marital status: Single    Spouse name: Not on file   Number of children: 2   Years of education: Not on file   Highest education level: Not on file  Occupational History   Occupation: CNA  Tobacco Use   Smoking status: Every Day    Packs/day: 1.50    Years: 10.00    Pack years: 15.00    Types: Cigarettes   Smokeless tobacco: Never  Vaping Use   Vaping Use: Never used  Substance and Sexual Activity   Alcohol use: Not Currently    Comment: on weekends   Drug use: No    Types: IV    Comment: pt denies 11/30/15   Sexual activity: Yes    Birth control/protection: Surgical    Comment: tubal  Other Topics Concern   Not on file  Social History Narrative   Daily caffeine    Social Determinants of Health   Financial Resource Strain: Low Risk     Difficulty of Paying Living Expenses: Not hard at all  Food Insecurity: No Food Insecurity   Worried About Programme researcher, broadcasting/film/video in the Last Year: Never true   Ran Out of Food in the Last Year: Never true  Transportation Needs: No Transportation Needs   Lack of Transportation (Medical): No   Lack of Transportation (Non-Medical): No  Physical Activity: Insufficiently Active   Days of Exercise per Week: 2 days   Minutes of Exercise per Session: 30 min  Stress: Stress Concern Present   Feeling of Stress : Very much  Social Connections: Moderately Isolated   Frequency  of Communication with Friends and Family: More than three times a week   Frequency of Social Gatherings with Friends and Family: Once a week   Attends Religious Services: More than 4 times per year   Active Member of [employer] or Organizations: No   Attends Banker Meetings: Never   Marital Status: Never married  Catering manager Violence: Not At Risk   Fear of Current or Ex-Partner: No   Emotionally Abused: No   Physically Abused: No   Sexually Abused: No             Mardella Layman, MD 09/23/20 1007

## 2020-09-20 NOTE — ED Triage Notes (Signed)
Patient presents to Urgent Care for follow-up from 09/02 visit. She continues to exp cough x 7 days and developed SOB. Treating symptoms with mobic.   Denies fever.

## 2021-02-20 ENCOUNTER — Other Ambulatory Visit: Payer: Self-pay

## 2021-02-20 ENCOUNTER — Ambulatory Visit
Admission: EM | Admit: 2021-02-20 | Discharge: 2021-02-20 | Disposition: A | Discharge status: Home / Self Care | Payer: Medicaid Other | Attending: Family Medicine | Admitting: Family Medicine

## 2021-02-20 DIAGNOSIS — R112 Nausea with vomiting, unspecified: Secondary | ICD-10-CM | POA: Diagnosis not present

## 2021-02-20 DIAGNOSIS — R040 Epistaxis: Secondary | ICD-10-CM

## 2021-02-20 MED ORDER — ONDANSETRON 4 MG PO TBDP: 4.0000 mg | ORAL_TABLET | Freq: Three times a day (TID) | ORAL | 0 refills | Status: DC | PRN

## 2021-02-20 NOTE — ED Provider Notes (Addendum)
[facility]    CSN: [id number] Arrival date & time: 02/20/21  1056      History   Chief Complaint Chief Complaint  Patient presents with   Emesis   Epistaxis    HPI [name] is a 36 y.o. female.   Presenting today with a nosebleed that occurred overnight.  She states that she woke up with dried blood on her face and nostril on the left.  She has had no further bleeding since that incident.  She also had several episodes of nausea and vomiting this morning but states that symptoms resolved once her menstrual cycle started later in the morning.  She states she has gotten sick like that previously prior to onset of her menses but is unsure why that happens.  She denies fever, chills, persisting abdominal pain, body aches, upper respiratory symptoms.  Took a Tylenol but otherwise not trying anything over-the-counter for symptoms.  No known sick contacts recently.   Past Medical History:  Diagnosis Date   Anemia, unspecified    Anxiety    Asthma    Congestive heart failure (CHF) (HCC)    Disc herniation    causes sciatica unsure which disc   Esophageal reflux    Fatty liver    Fibromyalgia    Gastroparesis    Headache(784.0)    Heart murmur    History of narcotic addiction (HCC) 09/30/2012   2010:  Per pt, was addicted to narcotics; mom helped intervene and stopped taking opiods    Hyperemesis gravidarum with metabolic disturbance, unspecified as to episode of care    Irritable bowel syndrome    MSSA bacteremia 02/20/2017   Other and unspecified noninfectious gastroenteritis and colitis(558.9)    Pregnant    PTSD (post-traumatic stress disorder)    Septic arthritis of right sternoclavicular joint (HCC) 02/20/2017   Unspecified asthma(493.90)     Patient Active Problem List   Diagnosis Date Noted   Bilateral leg edema 02/05/2018   Pancytopenia (HCC)    Blood per rectum    Hematuria    Chronic diastolic CHF (congestive heart failure) (HCC) 03/07/2017   PFO  (patent foramen ovale)    Endocarditis of tricuspid valve    Weakness of both lower extremities    Injection of illicit drug within last 12 months    Fibromyalgia 03/19/2012   GERD 02/02/2009   Irritable bowel syndrome 02/02/2009   COLITIS 04/29/2008   ANXIETY 04/28/2008   ASTHMA 04/28/2008   Gastroparesis 04/28/2008    Past Surgical History:  Procedure Laterality Date   APPENDECTOMY     CHOLECYSTECTOMY     COLONOSCOPY     INCISION AND DRAINAGE ABSCESS Left 04/09/2017   Procedure: INCISION AND DRAINAGE ABSCESS OF NECK;  Surgeon: Graylin Shiver, MD;  Location: [facility];  Service: ENT;  Laterality: Left;   LAPAROSCOPIC BILATERAL SALPINGECTOMY Bilateral 12/08/2012   Procedure: LAPAROSCOPIC BILATERAL SALPINGECTOMY;  Surgeon: Tilda Burrow, MD;  Location: [facility];  Service: Gynecology;  Laterality: Bilateral;   MULTIPLE EXTRACTIONS WITH ALVEOLOPLASTY N/A 12/01/2015   Procedure: EXTRACTION TEETH TWO, THREE, FOUR, SIX, SEVEN, EIGHT, NINE, TEN, ELEVEN, TWELVE, FOURTEEN, FIFTEEN, TWENTY, TWENTY ONE, TWENTY EIGHT, TWENTY NINE, THIRTY AND THIRTY ONE WITH ALVEOLOPLASTY;  Surgeon: Ocie Doyne, DDS;  Location: [facility];  Service: Oral Surgery;  Laterality: N/A;   TEE WITHOUT CARDIOVERSION N/A 02/20/2017   Procedure: TRANSESOPHAGEAL ECHOCARDIOGRAM (TEE);  Surgeon: Laurey Morale, MD;  Location: [facility] ENDOSCOPY;  Service: Cardiovascular;  Laterality: N/A;  TEE WITHOUT CARDIOVERSION N/A 04/11/2017   Procedure: TRANSESOPHAGEAL ECHOCARDIOGRAM (TEE);  Surgeon: Dixie Dials, MD;  Location: [facility] ENDOSCOPY;  Service: Cardiovascular;  Laterality: N/A;   TUBAL LIGATION      OB History     Gravida  3   Para  3   Term  3   Preterm      AB      Living  3      SAB      IAB      Ectopic      Multiple      Live Births  3            Home Medications    Prior to Admission medications   Medication Sig Start Date End Date Taking? Authorizing Provider  ondansetron (ZOFRAN-ODT) 4 MG  disintegrating tablet Take 1 tablet (4 mg total) by mouth every 8 (eight) hours as needed for nausea or vomiting. 02/20/21  Yes Volney American, PA-C  albuterol (VENTOLIN HFA) 108 (90 Base) MCG/ACT inhaler Inhale 1-2 puffs into the lungs every 6 (six) hours as needed for wheezing or shortness of breath. 09/20/20   Vanessa Kick, MD  benzonatate (TESSALON) 100 MG capsule Take 1 capsule by mouth every 8 (eight) hours for cough. 09/20/20   Vanessa Kick, MD  Buprenorphine HCl-Naloxone HCl 8-2 MG FILM Place 2 Film under the tongue daily.     [provider]  busPIRone (BUSPAR) 15 MG tablet TAKE 2 TABLETS BY MOUTHCTWICE DAILY. 08/26/19   [provider]  ferrous sulfate 325 (65 FE) MG tablet Take 1 tablet (325 mg total) by mouth 3 (three) times daily with meals. Patient taking differently: Take 325 mg by mouth daily with breakfast. 04/15/17   Nita Sells, MD  magnesium 30 MG tablet Take 1 tablet (30 mg total) by mouth daily. Patient not taking: No sig reported 04/18/19   Dorie Rank, MD  meloxicam (MOBIC) 15 MG tablet Take 1 tablet (15 mg total) by mouth daily. 09/15/20   Wurst, [place], PA-C  ofloxacin (OCUFLOX) 0.3 % ophthalmic solution Place 1 drop into the left eye 4 (four) times daily. 03/15/20   Avegno, Darrelyn Hillock, FNP  ondansetron (ZOFRAN-ODT) 4 MG disintegrating tablet Take 1 tablet (4 mg total) by mouth every 8 (eight) hours as needed for nausea or vomiting. 09/20/20   Vanessa Kick, MD  potassium chloride (KLOR-CON) 10 MEQ tablet Take 1 tablet (10 mEq total) by mouth 2 (two) times daily. 04/18/19   Dorie Rank, MD  predniSONE (DELTASONE) 20 MG tablet Take 2 tablets (40 mg total) by mouth daily. 09/20/20   Vanessa Kick, MD    Family History Family History  Problem Relation Age of Onset   Ulcerative colitis Paternal Grandmother    Cancer Paternal Grandmother    Colon polyps Paternal Grandfather    Cancer Paternal Grandfather        thyroid   Diabetes Father    Heart disease  Father    Kidney disease Father    Hypertension Father     Social History Social History   Tobacco Use   Smoking status: Every Day    Packs/day: 1.50    Years: 10.00    Pack years: 15.00    Types: Cigarettes   Smokeless tobacco: Never  Vaping Use   Vaping Use: Never used  Substance Use Topics   Alcohol use: Not Currently    Comment: on weekends   Drug use: No    Types: IV  Comment: pt denies 11/30/15     Allergies   Azithromycin, Latex, Nsaids, Hydrocodone, Penicillins, and Prednisone   Review of Systems Review of Systems Per HPI  Physical Exam Triage Vital Signs ED Triage Vitals  Enc Vitals Group     BP 02/20/21 1130 119/78     Pulse Rate 02/20/21 1130 87     Resp 02/20/21 1130 18     Temp 02/20/21 1130 (!) 97.5 F (36.4 C)     Temp src --      SpO2 02/20/21 1130 95 %     Weight --      Height --      Head Circumference --      Peak Flow --      Pain Score 02/20/21 1129 0     Pain Loc --      Pain Edu? --      Excl. in [place]? --    No data found.  Updated Vital Signs BP 119/78    Pulse 87    Temp (!) 97.5 F (36.4 C)    Resp 18    LMP 02/20/2021    SpO2 95%   Visual Acuity Right Eye Distance:   Left Eye Distance:   Bilateral Distance:    Right Eye Near:   Left Eye Near:    Bilateral Near:     Physical Exam Vitals and nursing note reviewed.  Constitutional:      Appearance: Normal appearance. She is not ill-appearing.  HENT:     Head: Atraumatic.     Nose: Nose normal.     Mouth/Throat:     Mouth: Mucous membranes are moist.     Pharynx: Oropharynx is clear. No posterior oropharyngeal erythema.  Eyes:     Extraocular Movements: Extraocular movements intact.     Conjunctiva/sclera: Conjunctivae normal.  Cardiovascular:     Rate and Rhythm: Normal rate and regular rhythm.     Heart sounds: Normal heart sounds.  Pulmonary:     Effort: Pulmonary effort is normal.     Breath sounds: Normal breath sounds.  Abdominal:     General: Bowel  sounds are normal. There is no distension.     Palpations: Abdomen is soft.     Tenderness: There is abdominal tenderness. There is no right CVA tenderness, left CVA tenderness or guarding.     Comments: Minimal generalized tenderness to palpation diffusely across abdomen without distention or guarding  Musculoskeletal:        General: Normal range of motion.     Cervical back: Normal range of motion and neck supple.  Skin:    General: Skin is warm and dry.  Neurological:     Mental Status: She is alert and oriented to person, place, and time.     Motor: No weakness.     Gait: Gait normal.  Psychiatric:        Mood and Affect: Mood normal.        Thought Content: Thought content normal.        Judgment: Judgment normal.     UC Treatments / Results  Labs (all labs ordered are listed, but only abnormal results are displayed) Labs Reviewed - No data to display  EKG   Radiology No results found.  Procedures Procedures (including critical care time)  Medications Ordered in UC Medications - No data to display  Initial Impression / Assessment and Plan / UC Course  I have reviewed the triage vital signs and the nursing  notes.  Pertinent labs & imaging results that were available during my care of the patient were reviewed by me and considered in my medical decision making (see chart for details).     Per patient, symptoms have now resolved and she is feeling well and at baseline health apart from some mild weakness from episodes of vomiting.  Suspect symptoms related to her menstrual cycle initiating as this is happened previously during this time but cannot rule out a viral illness or more internal causes at this point.  Continue to monitor, Zofran as needed, brat diet, fluids.  Regarding her nosebleed, no evidence of recurring bleeding since overnight.  Likely related to cold weather, dry air.  Discussed humidifier, Aquaphor to inside of naris, return for any significant  recurring bleeding.  Return for acutely worsening symptoms.  Work note given.  Final Clinical Impressions(s) / UC Diagnoses   Final diagnoses:  Nausea and vomiting, unspecified vomiting type  Epistaxis   Discharge Instructions   None    ED Prescriptions     Medication Sig Dispense Auth. Provider   ondansetron (ZOFRAN-ODT) 4 MG disintegrating tablet Take 1 tablet (4 mg total) by mouth every 8 (eight) hours as needed for nausea or vomiting. 20 tablet Volney American, [place]      PDMP not reviewed this encounter.   Volney American, [place] 02/20/21 [place], [place], [place] 02/20/21 1236

## 2021-02-20 NOTE — ED Triage Notes (Signed)
Pt states she had nose bleed last night  then vomiting early this morning  but is feeling a little better

## 2021-03-01 ENCOUNTER — Other Ambulatory Visit: Payer: Self-pay

## 2021-03-01 ENCOUNTER — Ambulatory Visit
Admission: EM | Admit: 2021-03-01 | Discharge: 2021-03-01 | Disposition: A | Discharge status: Home / Self Care | Payer: Medicaid Other | Attending: Family Medicine | Admitting: Family Medicine

## 2021-03-01 DIAGNOSIS — R300 Dysuria: Secondary | ICD-10-CM | POA: Insufficient documentation

## 2021-03-01 DIAGNOSIS — Z113 Encounter for screening for infections with a predominantly sexual mode of transmission: Secondary | ICD-10-CM | POA: Diagnosis not present

## 2021-03-01 LAB — POCT URINALYSIS DIP (MANUAL ENTRY)
Blood, UA: NEGATIVE
Glucose, UA: NEGATIVE mg/dL
Nitrite, UA: NEGATIVE
Spec Grav, UA: 1.025 (ref 1.010–1.025)
Urobilinogen, UA: 0.2 E.U./dL
pH, UA: 5.5 (ref 5.0–8.0)

## 2021-03-01 LAB — POCT URINE PREGNANCY: Preg Test, Ur: NEGATIVE
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
## 2021-03-02 ENCOUNTER — Telehealth (HOSPITAL_COMMUNITY): Payer: Self-pay | Admitting: Emergency Medicine

## 2021-03-02 LAB — CYTOLOGY, (ORAL, ANAL, URETHRAL) ANCILLARY ONLY
Chlamydia: POSITIVE — AB
Comment: NEGATIVE
Comment: NEGATIVE
Comment: NORMAL
Neisseria Gonorrhea: NEGATIVE
Trichomonas: NEGATIVE

## 2021-03-02 LAB — RPR: RPR Ser Ql: NONREACTIVE

## 2021-03-02 LAB — HIV ANTIBODY (ROUTINE TESTING W REFLEX): HIV Screen 4th Generation wRfx: NONREACTIVE

## 2021-03-02 MED ORDER — DOXYCYCLINE HYCLATE 100 MG PO CAPS: 100.0000 mg | ORAL_CAPSULE | Freq: Two times a day (BID) | ORAL | 0 refills | Status: AC

## 2021-03-03 LAB — URINE CULTURE: Culture: <10000 — AB

## 2021-04-02 ENCOUNTER — Other Ambulatory Visit: Payer: Self-pay

## 2021-04-02 ENCOUNTER — Encounter: Payer: Self-pay | Admitting: Cardiology

## 2021-04-02 ENCOUNTER — Ambulatory Visit: Payer: Medicaid Other | Admitting: Cardiology

## 2021-04-02 VITALS — BP 118/66 | HR 80 | Ht 67.0 in | Wt 210.6 lb

## 2021-04-02 DIAGNOSIS — I509 Heart failure, unspecified: Secondary | ICD-10-CM | POA: Insufficient documentation

## 2021-04-02 DIAGNOSIS — I361 Nonrheumatic tricuspid (valve) insufficiency: Secondary | ICD-10-CM

## 2021-04-02 MED ORDER — FUROSEMIDE 20 MG PO TABS: 20.0000 mg | ORAL_TABLET | Freq: Every day | ORAL | 1 refills | Status: DC | PRN

## 2021-04-02 NOTE — Progress Notes (Signed)
? ? ? ?Clinical Summary ?[name] is a 36 y.o.female seen today as a new consult, referred by NP Hyler ? ?History of endocarditis TV ?-prevoiusly followed by Dr Algie Coffer ?- 03/2017 admission with MSSA bacteremia. Has history of IV drug use ?- 03/2017 TEE: mobile vegetation TV, severe TR, mild MR, +PFP ?- was to follow up with CT surgery outpatient, I don't see an inpatient consult note though Dr Roseanne Kaufman note mentions she was not a candidate for TVR per CT surgery ?- from 06/26/2017 clinic visit with Dr Tyrone Sage was no indication for surgery  at the time.  ? ?Jan 2020 echo mod TR. RV moderately to severely dilated, mild to mod RV dysfunction ? ?- can feel like stomach swells at times. Infrequent LE, takes lasix prn.  ?- [place]  ?- chronic stable symptoms since 2019.  ?- last IV drug use over 4 years ago ? ? ?SH: works Education officer, environmental [employer] ? ? ? ?Past Medical History:  ?Diagnosis Date  ? Anemia, unspecified   ? Anxiety   ? Asthma   ? Congestive heart failure (CHF) (HCC)   ? Disc herniation   ? causes sciatica unsure which disc  ? Esophageal reflux   ? Fatty liver   ? Fibromyalgia   ? Gastroparesis   ? Headache(784.0)   ? Heart murmur   ? History of narcotic addiction (HCC) 09/30/2012  ? 2010:  Per pt, was addicted to narcotics; mom helped intervene and stopped taking opiods   ? Hyperemesis gravidarum with metabolic disturbance, unspecified as to episode of care   ? Irritable bowel syndrome   ? MSSA bacteremia 02/20/2017  ? Other and unspecified noninfectious gastroenteritis and colitis(558.9)   ? Pregnant   ? PTSD (post-traumatic stress disorder)   ? Septic arthritis of right sternoclavicular joint (HCC) 02/20/2017  ? Unspecified asthma(493.90)   ? ? ? ?Allergies  ?Allergen Reactions  ? Azithromycin Nausea And Vomiting  ?  ABDOMINAL PAIN  ? Latex Itching  ? Nsaids   ?  UNSPECIFIED REACTION  ?  ? Hydrocodone Nausea And Vomiting  ? Penicillins Nausea And Vomiting  ?   ?Has  patient had a PCN reaction causing immediate rash, facial/tongue/throat swelling, SOB or lightheadedness with hypotension: No ?Has patient had a PCN reaction causing severe rash involving mucus membranes or skin necrosis: No ?Has patient had a PCN reaction that required hospitalization No ?Has patient had a PCN reaction occurring within the last 10 years: No ?If all of the above answers are "NO", then may proceed with Cephalosporin use. ?  ? Prednisone Nausea Only and Other (See Comments)  ?  Can take shot, but pill form "caused stomach pain , nausea"  ? ? ? ?Current Outpatient Medications  ?Medication Sig Dispense Refill  ? albuterol (VENTOLIN HFA) 108 (90 Base) MCG/ACT inhaler Inhale 1-2 puffs into the lungs every 6 (six) hours as needed for wheezing or shortness of breath. 1 each 1  ? benzonatate (TESSALON) 100 MG capsule Take 1 capsule by mouth every 8 (eight) hours for cough. 21 capsule 0  ? Buprenorphine HCl-Naloxone HCl 8-2 MG FILM Place 2 Film under the tongue daily.     ? busPIRone (BUSPAR) 15 MG tablet TAKE 2 TABLETS BY MOUTHCTWICE DAILY.    ? ferrous sulfate 325 (65 FE) MG tablet Take 1 tablet (325 mg total) by mouth 3 (three) times daily with meals. (Patient taking differently: Take 325 mg by mouth daily with breakfast.) 90  tablet 3  ? magnesium 30 MG tablet Take 1 tablet (30 mg total) by mouth daily. (Patient not taking: No sig reported) 5 tablet 0  ? meloxicam (MOBIC) 15 MG tablet Take 1 tablet (15 mg total) by mouth daily. 20 tablet 0  ? ofloxacin (OCUFLOX) 0.3 % ophthalmic solution Place 1 drop into the left eye 4 (four) times daily. 5 mL 0  ? ondansetron (ZOFRAN-ODT) 4 MG disintegrating tablet Take 1 tablet (4 mg total) by mouth every 8 (eight) hours as needed for nausea or vomiting. 15 tablet 0  ? ondansetron (ZOFRAN-ODT) 4 MG disintegrating tablet Take 1 tablet (4 mg total) by mouth every 8 (eight) hours as needed for nausea or vomiting. 20 tablet 0  ? potassium chloride (KLOR-CON) 10 MEQ tablet  Take 1 tablet (10 mEq total) by mouth 2 (two) times daily. 10 tablet 0  ? predniSONE (DELTASONE) 20 MG tablet Take 2 tablets (40 mg total) by mouth daily. 10 tablet 0  ? ?No current facility-administered medications for this visit.  ? ? ? ?Past Surgical History:  ?Procedure Laterality Date  ? APPENDECTOMY    ? CHOLECYSTECTOMY    ? COLONOSCOPY    ? INCISION AND DRAINAGE ABSCESS Left 04/09/2017  ? Procedure: INCISION AND DRAINAGE ABSCESS OF NECK;  Surgeon: Graylin ShiverMarcellino, Amanda J, MD;  [facility];  Service: ENT;  Laterality: Left;  ? LAPAROSCOPIC BILATERAL SALPINGECTOMY Bilateral 12/08/2012  ? Procedure: LAPAROSCOPIC BILATERAL SALPINGECTOMY;  Surgeon: Tilda BurrowJohn V Ferguson, MD;  Location: [facility];  Service: Gynecology;  Laterality: Bilateral;  ? MULTIPLE EXTRACTIONS WITH ALVEOLOPLASTY N/A 12/01/2015  ? Procedure: EXTRACTION TEETH TWO, THREE, FOUR, SIX, SEVEN, EIGHT, NINE, TEN, ELEVEN, TWELVE, FOURTEEN, FIFTEEN, TWENTY, TWENTY ONE, TWENTY EIGHT, TWENTY NINE, THIRTY AND THIRTY ONE WITH ALVEOLOPLASTY;  Surgeon: Ocie DoyneScott Jensen, DDS;  Location: [facility] OR;  Service: Oral Surgery;  Laterality: N/A;  ? TEE WITHOUT CARDIOVERSION N/A 02/20/2017  ? Procedure: TRANSESOPHAGEAL ECHOCARDIOGRAM (TEE);  Surgeon: Laurey MoraleMcLean, Dalton S, MD;  [facility];  Service: Cardiovascular;  Laterality: N/A;  ? TEE WITHOUT CARDIOVERSION N/A 04/11/2017  ? Procedure: TRANSESOPHAGEAL ECHOCARDIOGRAM (TEE);  Surgeon: Orpah CobbKadakia, Ajay, MD;  [facility];  Service: Cardiovascular;  Laterality: N/A;  ? TUBAL LIGATION    ? ? ? ?Allergies  ?Allergen Reactions  ? Azithromycin Nausea And Vomiting  ?  ABDOMINAL PAIN  ? Latex Itching  ? Nsaids   ?  UNSPECIFIED REACTION  ?  ? Hydrocodone Nausea And Vomiting  ? Penicillins Nausea And Vomiting  ?   ?Has patient had a PCN reaction causing immediate rash, facial/tongue/throat swelling, SOB or lightheadedness with hypotension: No ?Has patient had a PCN reaction causing severe rash involving mucus membranes or skin  necrosis: No ?Has patient had a PCN reaction that required hospitalization No ?Has patient had a PCN reaction occurring within the last 10 years: No ?If all of the above answers are "NO", then may proceed with Cephalosporin use. ?  ? Prednisone Nausea Only and Other (See Comments)  ?  Can take shot, but pill form "caused stomach pain , nausea"  ? ? ? ? ?Family History  ?Problem Relation Age of Onset  ? Ulcerative colitis Paternal Grandmother   ? Cancer Paternal Grandmother   ? Colon polyps Paternal Grandfather   ? Cancer Paternal Grandfather   ?     thyroid  ? Diabetes Father   ? Heart disease Father   ? Kidney disease Father   ? Hypertension Father   ? ? ? ?Social  History ?[name] reports that she has been smoking cigarettes. She has a 15.00 pack-year smoking history. She has never used smokeless tobacco. ?[name] reports that she does not currently use alcohol. ? ? ?Review of Systems ?CONSTITUTIONAL: No weight loss, fever, chills, weakness or fatigue.  ?HEENT: Eyes: No visual loss, blurred vision, double vision or yellow sclerae.No hearing loss, sneezing, congestion, runny nose or sore throat.  ?SKIN: No rash or itching.  ?CARDIOVASCULAR: per hpi ?RESPIRATORY: per hpi ?GASTROINTESTINAL: No anorexia, nausea, vomiting or diarrhea. No abdominal pain or blood.  ?GENITOURINARY: No burning on urination, no polyuria ?NEUROLOGICAL: No headache, dizziness, syncope, paralysis, ataxia, numbness or tingling in the extremities. No change in bowel or bladder control.  ?MUSCULOSKELETAL: No muscle, back pain, joint pain or stiffness.  ?LYMPHATICS: No enlarged nodes. No history of splenectomy.  ?PSYCHIATRIC: No history of depression or anxiety.  ?ENDOCRINOLOGIC: No reports of sweating, cold or heat intolerance. No polyuria or polydipsia.  ?. ? ? ?Physical Examination ?Today's Vitals  ? 04/02/21 1040  ?BP: 118/66  ?Pulse: 80  ?SpO2: 96%  ?Weight: 210 lb 9.6 oz (95.5 kg)  ?Height: 5\' 7"  (1.702 m)  ? ?Body mass index is 32.98  kg/m?. ? ?Gen: resting comfortably, no acute distress ?HEENT: no scleral icterus, pupils equal round and reactive, no palptable cervical adenopathy,  ?CV: RRR, no m/r/g no jvd ?Resp: Clear to auscultation b

## 2021-04-02 NOTE — Patient Instructions (Addendum)
Medication Instructions:  ?Your physician has recommended you make the following change in your medication:  ?Start furosemide 20 mg daily as needed for swelling ?Continue other medications the same ? ?Labwork: ?none ? ?Testing/Procedures: ?Your physician has requested that you have an echocardiogram. Echocardiography is a painless test that uses sound waves to create images of your heart. It provides your doctor with information about the size and shape of your heart and how well your heart?s chambers and valves are working. This procedure takes approximately one hour. There are no restrictions for this procedure. ? ?Follow-Up: ?Your physician recommends that you schedule a follow-up appointment in: 6 months ? ?Any Other Special Instructions Will Be Listed Below (If Applicable). ? ?If you need a refill on your cardiac medications before your next appointment, please call your pharmacy. ?

## 2021-05-01 ENCOUNTER — Ambulatory Visit (INDEPENDENT_AMBULATORY_CARE_PROVIDER_SITE_OTHER): Payer: Medicaid Other

## 2021-05-01 DIAGNOSIS — I361 Nonrheumatic tricuspid (valve) insufficiency: Secondary | ICD-10-CM | POA: Diagnosis not present

## 2021-05-01 LAB — ECHOCARDIOGRAM COMPLETE
AR max vel: 3.23 cm2
AV Area VTI: 3 cm2
AV Area mean vel: 2.97 cm2
AV Mean grad: 3 mmHg
AV Peak grad: 5.5 mmHg
Ao pk vel: 1.17 m/s
Area-P 1/2: 3.02 cm2
Calc EF: 60.3 %
S' Lateral: 2.79 cm
Single Plane A2C EF: 62.8 %
Single Plane A4C EF: 58.8 %

## 2021-05-21 ENCOUNTER — Telehealth: Payer: Self-pay

## 2021-05-21 NOTE — Telephone Encounter (Signed)
-----   Message from Arnoldo Lenis, MD sent at 05/21/2021  3:58 PM EDT ----- ?Echo shows heart pumping function is normal Tricuspid valve remains just moderately leaky, just something to continue to monitor at this time. The previous enlargement of the heart the leaky valve had caused has resolved which is a good sign ? ?Zandra Abts MD ?

## 2021-05-21 NOTE — Telephone Encounter (Signed)
Patient notified and verbalized understanding. Pt had no questions or concerns at this time 

## 2021-06-20 ENCOUNTER — Ambulatory Visit: Payer: Medicaid Other | Admitting: Adult Health

## 2021-09-25 ENCOUNTER — Ambulatory Visit
Admission: EM | Admit: 2021-09-25 | Discharge: 2021-09-25 | Disposition: A | Discharge status: Home / Self Care | Payer: Medicaid Other | Attending: Nurse Practitioner | Admitting: Nurse Practitioner

## 2021-09-25 ENCOUNTER — Encounter: Payer: Self-pay | Admitting: Emergency Medicine

## 2021-09-25 DIAGNOSIS — R0989 Other specified symptoms and signs involving the circulatory and respiratory systems: Secondary | ICD-10-CM | POA: Diagnosis present

## 2021-09-25 DIAGNOSIS — J029 Acute pharyngitis, unspecified: Secondary | ICD-10-CM | POA: Diagnosis present

## 2021-09-25 DIAGNOSIS — Z1152 Encounter for screening for COVID-19: Secondary | ICD-10-CM | POA: Insufficient documentation

## 2021-09-25 LAB — POCT RAPID STREP A (OFFICE): Rapid Strep A Screen: NEGATIVE

## 2021-09-25 MED ORDER — ALBUTEROL SULFATE HFA 108 (90 BASE) MCG/ACT IN AERS: 2.0000 | INHALATION_SPRAY | Freq: Four times a day (QID) | RESPIRATORY_TRACT | 0 refills | Status: DC | PRN

## 2021-09-25 MED ORDER — PROMETHAZINE-DM 6.25-15 MG/5ML PO SYRP: 5.0000 mL | ORAL_SOLUTION | Freq: Four times a day (QID) | ORAL | 0 refills | Status: DC | PRN

## 2021-09-25 NOTE — ED Provider Notes (Signed)
[facility]    CSN: [id number] Arrival date & time: 09/25/21  6387      History   Chief Complaint No chief complaint on file.   HPI [name] is a 36 y.o. female.   The history is provided by the patient.   Patient presents with a 2-day history of sore throat, mild cough, and intermittent shortness of breath.  Patient states that over the weekend, she reports she was around her nephew who was diagnosed with strep earlier this week.  She states that last evening, her throat pain became worse.  She states that it remains persistent at this time.  She denies fever, although she states when her symptoms started, she had an episode of "feeling really hot".  She states that she checked her temperature but does not think her thermometer was working properly.  She denies ear pain, nasal congestion, runny nose, wheezing, or GI symptoms.  Patient states she is taken Advil and Tylenol cold and flu for her symptoms this morning.  Patient would also like COVID testing.  Patient reports that she does have an underlying history of asthma, she smokes, and has heart disease.  Past Medical History:  Diagnosis Date   Anemia, unspecified    Anxiety    Asthma    Congestive heart failure (CHF) (HCC)    Disc herniation    causes sciatica unsure which disc   Esophageal reflux    Fatty liver    Fibromyalgia    Gastroparesis    Headache(784.0)    Heart murmur    History of narcotic addiction (HCC) 09/30/2012   2010:  Per pt, was addicted to narcotics; mom helped intervene and stopped taking opiods    Hyperemesis gravidarum with metabolic disturbance, unspecified as to episode of care    Irritable bowel syndrome    MSSA bacteremia 02/20/2017   Other and unspecified noninfectious gastroenteritis and colitis(558.9)    Pregnant    PTSD (post-traumatic stress disorder)    Septic arthritis of right sternoclavicular joint (HCC) 02/20/2017   Unspecified asthma(493.90)     Patient Active  Problem List   Diagnosis Date Noted   Congestive heart failure (CHF) (HCC) 04/02/2021   Bilateral leg edema 02/05/2018   Pancytopenia (HCC)    Blood per rectum    Hematuria    Chronic diastolic CHF (congestive heart failure) (HCC) 03/07/2017   PFO (patent foramen ovale)    Endocarditis of tricuspid valve    Weakness of both lower extremities    Injection of illicit drug within last 12 months    Fibromyalgia 03/19/2012   GERD 02/02/2009   Irritable bowel syndrome 02/02/2009   COLITIS 04/29/2008   ANXIETY 04/28/2008   ASTHMA 04/28/2008   Gastroparesis 04/28/2008    Past Surgical History:  Procedure Laterality Date   APPENDECTOMY     CHOLECYSTECTOMY     COLONOSCOPY     INCISION AND DRAINAGE ABSCESS Left 04/09/2017   Procedure: INCISION AND DRAINAGE ABSCESS OF NECK;  Surgeon: Graylin Shiver, MD;  Location: [facility];  Service: ENT;  Laterality: Left;   LAPAROSCOPIC BILATERAL SALPINGECTOMY Bilateral 12/08/2012   Procedure: LAPAROSCOPIC BILATERAL SALPINGECTOMY;  Surgeon: Tilda Burrow, MD;  Location: [facility];  Service: Gynecology;  Laterality: Bilateral;   MULTIPLE EXTRACTIONS WITH ALVEOLOPLASTY N/A 12/01/2015   Procedure: EXTRACTION TEETH TWO, THREE, FOUR, SIX, SEVEN, EIGHT, NINE, TEN, ELEVEN, TWELVE, FOURTEEN, FIFTEEN, TWENTY, TWENTY ONE, TWENTY EIGHT, TWENTY NINE, THIRTY AND THIRTY ONE WITH ALVEOLOPLASTY;  Surgeon: Ocie Doyne,  DDS;  Location: [facility];  Service: Oral Surgery;  Laterality: N/A;   TEE WITHOUT CARDIOVERSION N/A 02/20/2017   Procedure: TRANSESOPHAGEAL ECHOCARDIOGRAM (TEE);  Surgeon: Larey Dresser, MD;  Location: [facility] END[facility]COPY;  Service: Cardiovascular;  Laterality: N/A;   TEE WITHOUT CARDIOVERSION N/A 04/11/2017   Procedure: TRANSESOPHAGEAL ECHOCARDIOGRAM (TEE);  Surgeon: Dixie Dials, MD;  Location: [facility] ENDOSCOPY;  Service: Cardiovascular;  Laterality: N/A;   TUBAL LIGATION      OB History     Gravida  3   Para  3   Term  3   Preterm      AB      Living   3      SAB      IAB      Ectopic      Multiple      Live Births  3            Home Medications    Prior to Admission medications   Medication Sig Start Date End Date Taking? Authorizing Provider  albuterol (VENTOLIN HFA) 108 (90 Base) MCG/ACT inhaler Inhale 2 puffs into the lungs every 6 (six) hours as needed for wheezing or shortness of breath. 09/25/21  Yes Jolyssa Oplinger-Warren, Alda Lea, NP  promethazine-dextromethorphan (PROMETHAZINE-DM) 6.25-15 MG/5ML syrup Take 5 mLs by mouth 4 (four) times daily as needed for cough. 09/25/21  Yes Tegh Franek-Warren, Alda Lea, NP  Buprenorphine HCl-Naloxone HCl 8-2 MG FILM Place 2 Film under the tongue daily.     [provider]  busPIRone (BUSPAR) 15 MG tablet TAKE 2 TABLETS BY MOUTHCTWICE DAILY. 08/26/19   [provider]  ferrous sulfate 325 (65 FE) MG tablet Take 1 tablet (325 mg total) by mouth 3 (three) times daily with meals. Patient taking differently: Take 325 mg by mouth daily with breakfast. 04/15/17   Nita Sells, MD  furosemide (LASIX) 20 MG tablet Take 1 tablet (20 mg total) by mouth daily as needed (swelling). 04/02/21   Arnoldo Lenis, MD  ondansetron (ZOFRAN-ODT) 4 MG disintegrating tablet Take 1 tablet (4 mg total) by mouth every 8 (eight) hours as needed for nausea or vomiting. 09/20/20   Vanessa Kick, MD  ondansetron (ZOFRAN-ODT) 4 MG disintegrating tablet Take 1 tablet (4 mg total) by mouth every 8 (eight) hours as needed for nausea or vomiting. 02/20/21   Volney American, PA-C    Family History Family History  Problem Relation Age of Onset   Ulcerative colitis Paternal Grandmother    Cancer Paternal Grandmother    Colon polyps Paternal Grandfather    Cancer Paternal Grandfather        thyroid   Diabetes Father    Heart disease Father    Kidney disease Father    Hypertension Father     Social History Social History   Tobacco Use   Smoking status: Every Day    Packs/day: 1.50     Years: 10.00    Total pack years: 15.00    Types: Cigarettes   Smokeless tobacco: Never  Vaping Use   Vaping Use: Never used  Substance Use Topics   Alcohol use: Not Currently    Comment: on weekends   Drug use: No    Types: IV    Comment: pt denies 11/30/15     Allergies   Azithromycin, Latex, Nsaids, Hydrocodone, Penicillins, and Prednisone   Review of Systems Review of Systems Per HPI  Physical Exam Triage Vital Signs ED Triage Vitals  Enc Vitals Group  BP 09/25/21 0823 114/76     Pulse Rate 09/25/21 0823 79     Resp 09/25/21 0823 16     Temp 09/25/21 0823 97.7 F (36.5 C)     Temp Source 09/25/21 0823 Oral     SpO2 09/25/21 0823 95 %     Weight --      Height --      Head Circumference --      Peak Flow --      Pain Score 09/25/21 0824 4     Pain Loc --      Pain Edu? --      Excl. in [place]? --    No data found.  Updated Vital Signs BP 114/76 (BP Location: Right Arm)   Pulse 79   Temp 97.7 F (36.5 C) (Oral)   Resp 16   LMP 09/17/2021 (Approximate)   SpO2 95%   Visual Acuity Right Eye Distance:   Left Eye Distance:   Bilateral Distance:    Right Eye Near:   Left Eye Near:    Bilateral Near:     Physical Exam Vitals and nursing note reviewed.  Constitutional:      General: She is not in acute distress.    Appearance: Normal appearance.  HENT:     Head: Normocephalic.     Right Ear: Tympanic membrane, ear canal and external ear normal.     Left Ear: Tympanic membrane, ear canal and external ear normal.     Nose: Congestion present.     Right Turbinates: Enlarged and swollen.     Left Turbinates: Enlarged and swollen.     Right Sinus: No maxillary sinus tenderness or frontal sinus tenderness.     Left Sinus: No maxillary sinus tenderness or frontal sinus tenderness.     Mouth/Throat:     Lips: Pink.     Mouth: Mucous membranes are moist.     Pharynx: Uvula midline. Pharyngeal swelling and posterior oropharyngeal erythema present. No  oropharyngeal exudate or uvula swelling.     Tonsils: 1+ on the right. 1+ on the left.  Eyes:     Extraocular Movements: Extraocular movements intact.     Conjunctiva/sclera: Conjunctivae normal.     Pupils: Pupils are equal, round, and reactive to light.  Cardiovascular:     Rate and Rhythm: Normal rate and regular rhythm.  Pulmonary:     Effort: Pulmonary effort is normal. No respiratory distress.     Breath sounds: Normal breath sounds. No stridor. No wheezing, rhonchi or rales.  Chest:     Chest wall: No tenderness.  Abdominal:     General: Bowel sounds are normal.     Palpations: Abdomen is soft.  Musculoskeletal:     Cervical back: Normal range of motion.  Lymphadenopathy:     Cervical: No cervical adenopathy.  Skin:    General: Skin is warm and dry.  Neurological:     General: No focal deficit present.     Mental Status: She is alert and oriented to person, place, and time.  Psychiatric:        Mood and Affect: Mood normal.        Behavior: Behavior normal.      UC Treatments / Results  Labs (all labs ordered are listed, but only abnormal results are displayed) Labs Reviewed  SARS CORONAVIRUS 2 (TAT 6-24 HRS)  CULTURE, GROUP A STREP [facility])  POCT RAPID STREP A (OFFICE)    EKG   Radiology No results  found.  Procedures Procedures (including critical care time)  Medications Ordered in UC Medications - No data to display  Initial Impression / Assessment and Plan / UC Course  I have reviewed the triage vital signs and the nursing notes.  Pertinent labs & imaging results that were available during my care of the patient were reviewed by me and considered in my medical decision making (see chart for details).  Patient presents with a 1 day history of upper respiratory symptoms.  On exam, patient's vital signs are stable, she is in no acute distress.  Lung sounds are clear throughout.  Exam is otherwise benign.  Symptoms appear to be consistent with an upper  respiratory infection at this time.  Differential diagnoses include COVID-19, viral sinusitis, or allergic rhinitis.  Rapid strep test was negative, COVID test and throat culture are pending at this time.  Symptomatic treatment was provided with Promethazine DM, and an albuterol inhaler.  Patient advised that if her COVID results are positive, she is a candidate to receive molnupiravir.  Patient verbalizes understanding.  Patient advised to follow-up in this clinic or with her primary care physician if symptoms worsen or fail to improve. Final Clinical Impressions(s) / UC Diagnoses   Final diagnoses:  Encounter for screening for COVID-19  Symptoms of upper respiratory infection (URI)  Sore throat     Discharge Instructions      The rapid strep test is negative, COVID test and throat culture are pending.  You will be contacted if the results of your COVID test are positive.  You may receive molnupiravir, an antiviral, if the results of your COVID test are positive. Take medication as prescribed. Increase fluids and allow for plenty of rest. Recommend Tylenol or ibuprofen as needed for pain, fever, or general discomfort. Recommend throat lozenges, Chloraseptic or honey to help with throat pain. Warm salt water gargles 3-4 times daily to help with throat pain or discomfort. Recommend a diet with soft foods to include soups, broths, puddings, yogurt, Jell-O's, or popsicles until symptoms improve. Follow-up with your primary care physician or in this clinic if symptoms fail to improve.     ED Prescriptions     Medication Sig Dispense Auth. Provider   promethazine-dextromethorphan (PROMETHAZINE-DM) 6.25-15 MG/5ML syrup Take 5 mLs by mouth 4 (four) times daily as needed for cough. 140 mL Khairi Garman-Warren, Sadie Haber, NP   albuterol (VENTOLIN HFA) 108 (90 Base) MCG/ACT inhaler Inhale 2 puffs into the lungs every 6 (six) hours as needed for wheezing or shortness of breath. 8 g Mclain Freer-Warren, Sadie Haber, NP      PDMP not reviewed this encounter.   Abran Cantor, NP 09/25/21 [phone]

## 2021-09-25 NOTE — Discharge Instructions (Addendum)
The rapid strep test is negative, COVID test and throat culture are pending.  You will be contacted if the results of your COVID test are positive.  You may receive molnupiravir, an antiviral, if the results of your COVID test are positive. Take medication as prescribed. Increase fluids and allow for plenty of rest. Recommend Tylenol or ibuprofen as needed for pain, fever, or general discomfort. Recommend throat lozenges, Chloraseptic or honey to help with throat pain. Warm salt water gargles 3-4 times daily to help with throat pain or discomfort. Recommend a diet with soft foods to include soups, broths, puddings, yogurt, Jell-O's, or popsicles until symptoms improve. Follow-up with your primary care physician or in this clinic if symptoms fail to improve.

## 2021-09-25 NOTE — ED Triage Notes (Signed)
Sore throat since Monday, feels SOB, some coughing.

## 2021-09-26 LAB — SARS CORONAVIRUS 2 (TAT 6-24 HRS): SARS Coronavirus 2: NEGATIVE

## 2021-09-28 LAB — CULTURE, GROUP A STREP (THRC)

## 2021-10-03 ENCOUNTER — Other Ambulatory Visit: Payer: Self-pay | Admitting: Cardiology

## 2021-10-05 ENCOUNTER — Encounter: Payer: Self-pay | Admitting: Cardiology

## 2021-10-05 ENCOUNTER — Ambulatory Visit: Payer: Medicaid Other | Admitting: Cardiology

## 2021-10-05 NOTE — Progress Notes (Deleted)
Clinical Summary [name] is a 36 y.o.female  History of endocarditis TV -prevoiusly followed by Dr Doylene Canard - 03/2017 admission with MSSA bacteremia. Has history of IV drug use - 03/2017 TEE: mobile vegetation TV, severe TR, mild MR, +PFP - was to follow up with CT surgery outpatient, I don't see an inpatient consult note though Dr Merrilee Jansky note mentions she was not a candidate for TVR per CT surgery - from 06/26/2017 clinic visit with Dr Servando Snare was no indication for surgery  at the time.    Jan 2020 echo mod TR. RV moderately to severely dilated, mild to mod RV dysfunction   - can feel like stomach swells at times. Infrequent LE, takes lasix prn.  - [place] DOE [place]  - chronic stable symptoms since 2019.  - last IV drug use over 4 years ago     04/2021 echo: LVEF 0000000, normal diastolic fxn, normal RV function that is mildly enlarged, PASP 31, mod TR   SH: works [employer]     Past Medical History:  Diagnosis Date   Anemia, unspecified    Anxiety    Asthma    Congestive heart failure (CHF) (HCC)    Disc herniation    causes sciatica unsure which disc   Esophageal reflux    Fatty liver    Fibromyalgia    Gastroparesis    Headache(784.0)    Heart murmur    History of narcotic addiction ([place]) 09/30/2012   2010:  Per pt, was addicted to narcotics; mom helped intervene and stopped taking opiods    Hyperemesis gravidarum with metabolic disturbance, unspecified as to episode of care    Irritable bowel syndrome    MSSA bacteremia 02/20/2017   Other and unspecified noninfectious gastroenteritis and colitis(558.9)    Pregnant    PTSD (post-traumatic stress disorder)    Septic arthritis of right sternoclavicular joint (HCC) 02/20/2017   Unspecified asthma(493.90)      Allergies  Allergen Reactions   Azithromycin Nausea And Vomiting    ABDOMINAL PAIN   Latex Itching   Nsaids     UNSPECIFIED REACTION      Hydrocodone Nausea And Vomiting   Penicillins Nausea And Vomiting     Has patient had a PCN reaction causing immediate rash, facial/tongue/throat swelling, SOB or lightheadedness with hypotension: No Has patient had a PCN reaction causing severe rash involving mucus membranes or skin necrosis: No Has patient had a PCN reaction that required hospitalization No Has patient had a PCN reaction occurring within the last 10 years: No If all of the above answers are "NO", then may proceed with Cephalosporin use.    Prednisone Nausea Only and Other (See Comments)    Can take shot, but pill form "caused stomach pain , nausea"     Current Outpatient Medications  Medication Sig Dispense Refill   albuterol (VENTOLIN HFA) 108 (90 Base) MCG/ACT inhaler Inhale 2 puffs into the lungs every 6 (six) hours as needed for wheezing or shortness of breath. 8 g 0   Buprenorphine HCl-Naloxone HCl 8-2 MG FILM Place 2 Film under the tongue daily.      busPIRone (BUSPAR) 15 MG tablet TAKE 2 TABLETS BY MOUTHCTWICE DAILY.     ferrous sulfate 325 (65 FE) MG tablet Take 1 tablet (325 mg total) by mouth 3 (three) times daily with meals. (Patient taking differently: Take 325 mg by mouth daily with breakfast.) 90 tablet 3   furosemide (  LASIX) 20 MG tablet TAKE ONE TABLET DAILY AS NEEDED. 90 tablet 0   ondansetron (ZOFRAN-ODT) 4 MG disintegrating tablet Take 1 tablet (4 mg total) by mouth every 8 (eight) hours as needed for nausea or vomiting. 15 tablet 0   ondansetron (ZOFRAN-ODT) 4 MG disintegrating tablet Take 1 tablet (4 mg total) by mouth every 8 (eight) hours as needed for nausea or vomiting. 20 tablet 0   promethazine-dextromethorphan (PROMETHAZINE-DM) 6.25-15 MG/5ML syrup Take 5 mLs by mouth 4 (four) times daily as needed for cough. 140 mL 0   No current facility-administered medications for this visit.     Past Surgical History:  Procedure Laterality Date   APPENDECTOMY     CHOLECYSTECTOMY     COLONOSCOPY      INCISION AND DRAINAGE ABSCESS Left 04/09/2017   Procedure: INCISION AND DRAINAGE ABSCESS OF NECK;  Surgeon: Helayne Seminole, MD;  Location: [facility];  Service: ENT;  Laterality: Left;   LAPAROSCOPIC BILATERAL SALPINGECTOMY Bilateral 12/08/2012   Procedure: LAPAROSCOPIC BILATERAL SALPINGECTOMY;  Surgeon: Jonnie Kind, MD;  Location: [facility];  Service: Gynecology;  Laterality: Bilateral;   MULTIPLE EXTRACTIONS WITH ALVEOLOPLASTY N/A 12/01/2015   Procedure: EXTRACTION TEETH TWO, THREE, FOUR, SIX, SEVEN, EIGHT, NINE, TEN, ELEVEN, TWELVE, FOURTEEN, FIFTEEN, TWENTY, TWENTY ONE, TWENTY EIGHT, TWENTY NINE, THIRTY AND THIRTY ONE WITH ALVEOLOPLASTY;  Surgeon: Diona Browner, DDS;  Location: [place];  Service: Oral Surgery;  Laterality: N/A;   TEE WITHOUT CARDIOVERSION N/A 02/20/2017   Procedure: TRANSESOPHAGEAL ECHOCARDIOGRAM (TEE);  Surgeon: Larey Dresser, MD;  Location: [facility];  Service: Cardiovascular;  Laterality: N/A;   TEE WITHOUT CARDIOVERSION N/A 04/11/2017   Procedure: TRANSESOPHAGEAL ECHOCARDIOGRAM (TEE);  Surgeon: Dixie Dials, MD;  Location: [facility] ENDOSCOPY;  Service: Cardiovascular;  Laterality: N/A;   TUBAL LIGATION       Allergies  Allergen Reactions   Azithromycin Nausea And Vomiting    ABDOMINAL PAIN   Latex Itching   Nsaids     UNSPECIFIED REACTION     Hydrocodone Nausea And Vomiting   Penicillins Nausea And Vomiting     Has patient had a PCN reaction causing immediate rash, facial/tongue/throat swelling, SOB or lightheadedness with hypotension: No Has patient had a PCN reaction causing severe rash involving mucus membranes or skin necrosis: No Has patient had a PCN reaction that required hospitalization No Has patient had a PCN reaction occurring within the last 10 years: No If all of the above answers are "NO", then may proceed with Cephalosporin use.    Prednisone Nausea Only and Other (See Comments)    Can take shot, but pill form "caused stomach pain , nausea"       Family History  Problem Relation Age of Onset   Ulcerative colitis Paternal Grandmother    Cancer Paternal Grandmother    Colon polyps Paternal Grandfather    Cancer Paternal Grandfather        thyroid   Diabetes Father    Heart disease Father    Kidney disease Father    Hypertension Father      Social History [name] reports that she has been smoking cigarettes. She has a 15.00 pack-year smoking history. She has never used smokeless tobacco. [name] reports that she does not currently use alcohol.   Review of Systems CONSTITUTIONAL: No weight loss, fever, chills, weakness or fatigue.  HEENT: Eyes: No visual loss, blurred vision, double vision or yellow sclerae.No hearing loss, sneezing, congestion, runny nose or sore throat.  SKIN: No rash  or itching.  CARDIOVASCULAR:  RESPIRATORY: No shortness of breath, cough or sputum.  GASTROINTESTINAL: No anorexia, nausea, vomiting or diarrhea. No abdominal pain or blood.  GENITOURINARY: No burning on urination, no polyuria NEUROLOGICAL: No headache, dizziness, syncope, paralysis, ataxia, numbness or tingling in the extremities. No change in bowel or bladder control.  MUSCULOSKELETAL: No muscle, back pain, joint pain or stiffness.  LYMPHATICS: No enlarged nodes. No history of splenectomy.  PSYCHIATRIC: No history of depression or anxiety.  ENDOCRINOLOGIC: No reports of sweating, cold or heat intolerance. No polyuria or polydipsia.  Marland Kitchen   Physical Examination There were no vitals filed for this visit. There were no vitals filed for this visit.  Gen: resting comfortably, no acute distress HEENT: no scleral icterus, pupils equal round and reactive, no palptable cervical adenopathy,  CV Resp: Clear to auscultation bilaterally GI: abdomen is soft, non-tender, non-distended, normal bowel sounds, no hepatosplenomegaly MSK: extremities are warm, no edema.  Skin: warm, no rash Neuro:  no focal deficits Psych: appropriate  affect   Diagnostic Studies  Jan 2020 echo Study Conclusions   - Left ventricle: The cavity size was normal. Systolic function was    mildly reduced. The estimated ejection fraction was in the range    of 45% to 50%. There is mild hypokinesis of the    basal-midanteroseptal myocardium.  - Right ventricle: The cavity size was moderately to severely    dilated. Wall thickness was normal. Systolic function was mildly    to moderately reduced.  - Right atrium: The atrium was moderately dilated.  - Tricuspid valve: Cannot exclude vegetation. There was moderate    regurgitation.        04/2021 echo 1. Left ventricular ejection fraction, by estimation, is 55 to 60%. The  left ventricle has normal function. The left ventricle has no regional  wall motion abnormalities. Left ventricular diastolic parameters were  normal. The average left ventricular  global longitudinal strain is -22.3 %. The global longitudinal strain is  normal.   2. Right ventricular systolic function is normal. The right ventricular  size is mildly enlarged. There is normal pulmonary artery systolic  pressure. The estimated right ventricular systolic pressure is 99991111 mmHg.   3. The mitral valve is grossly normal. Trivial mitral valve  regurgitation.   4. The tricuspid valve is abnormal. There is mild prolapse of the tip of  the anterior leaflet with associated eccentric tricuspid valve  regurgitation that is moderate.   5. The aortic valve is tricuspid. Aortic valve regurgitation is not  visualized. No aortic stenosis is present. Aortic valve mean gradient  measures 3.0 mmHg.   6. The inferior vena cava is normal in size with greater than 50%  respiratory variability, suggesting right atrial pressure of 3 mmHg.   Assessment and Plan  History of TV endocarditis/Tricuspid regurgitation - chronic stable SOB/DOE since her admission in 2019. Occasional LE edema, will prescribe 20mg  prn - repeat echo to reassess  valve and RV function. She also had low normal to mildly decreased LV function in 2020 that needs to be reassessed   EKG today SR, no ischemic changes. Request pcp labs      Arnoldo Lenis, M.D.

## 2022-02-12 ENCOUNTER — Ambulatory Visit: Payer: Medicaid Other | Attending: Cardiology | Admitting: Cardiology

## 2022-02-12 ENCOUNTER — Encounter: Payer: Self-pay | Admitting: Cardiology

## 2022-02-12 VITALS — BP 108/70 | HR 79 | Ht 67.0 in | Wt 220.0 lb

## 2022-02-12 DIAGNOSIS — R600 Localized edema: Secondary | ICD-10-CM | POA: Insufficient documentation

## 2022-02-12 DIAGNOSIS — I5032 Chronic diastolic (congestive) heart failure: Secondary | ICD-10-CM | POA: Diagnosis not present

## 2022-02-12 DIAGNOSIS — Z79899 Other long term (current) drug therapy: Secondary | ICD-10-CM | POA: Diagnosis not present

## 2022-02-12 DIAGNOSIS — I361 Nonrheumatic tricuspid (valve) insufficiency: Secondary | ICD-10-CM | POA: Diagnosis not present

## 2022-02-12 MED ORDER — FUROSEMIDE 40 MG PO TABS: 40.0000 mg | ORAL_TABLET | ORAL | 2 refills | Status: DC | PRN

## 2022-02-12 NOTE — Patient Instructions (Signed)
Medication Instructions:  Increase Lasix to 40mg  as needed for swelling  Continue all other medications.     Labwork: BMET, Mg - orders given today Please do in 2 weeks  Office will contact with results via phone, letter or mychart.     Testing/Procedures: none  Follow-Up: 6 months   Any Other Special Instructions Will Be Listed Below (If Applicable).   If you need a refill on your cardiac medications before your next appointment, please call your pharmacy.

## 2022-02-12 NOTE — Progress Notes (Signed)
Clinical Summary Ms. Lagrange is a 37 y.o.female seen today as a new consult, referred by NP Hyler   History of endocarditis TV -prevoiusly followed by Dr Doylene Canard - 03/2017 admission with MSSA bacteremia. Has history of IV drug use - 03/2017 TEE: mobile vegetation TV, severe TR, mild MR, +PFP - was to follow up with CT surgery outpatient, I don't see an inpatient consult note though Dr Merrilee Jansky note mentions she was not a candidate for TVR per CT surgery - from 06/26/2017 clinic visit with Dr Servando Snare was no indication for surgery  at the time.    Jan 2020 echo mod TR. RV moderately to severely dilated, mild to mod RV dysfunction 04/2021 echo: LVEF 55-60%, normal RV function with mild enlargement, normal PASP, moderate TR - has lasix 20mg  prn, was taking about once a week. Some abdominal distension at times    SH: works [employer]   Past Medical History:  Diagnosis Date   Anemia, unspecified    Anxiety    Asthma    Congestive heart failure (CHF) (HCC)    Disc herniation    causes sciatica unsure which disc   Esophageal reflux    Fatty liver    Fibromyalgia    Gastroparesis    Headache(784.0)    Heart murmur    History of narcotic addiction ([place]) 09/30/2012   2010:  Per pt, was addicted to narcotics; mom helped intervene and stopped taking opiods    Hyperemesis gravidarum with metabolic disturbance, unspecified as to episode of care    Irritable bowel syndrome    MSSA bacteremia 02/20/2017   Other and unspecified noninfectious gastroenteritis and colitis(558.9)    Pregnant    PTSD (post-traumatic stress disorder)    Septic arthritis of right sternoclavicular joint (HCC) 02/20/2017   Unspecified asthma(493.90)      Allergies  Allergen Reactions   Azithromycin Nausea And Vomiting    ABDOMINAL PAIN   Latex Itching   Nsaids     UNSPECIFIED REACTION     Hydrocodone Nausea And Vomiting   Penicillins Nausea And Vomiting     Has patient had a PCN reaction  causing immediate rash, facial/tongue/throat swelling, SOB or lightheadedness with hypotension: No Has patient had a PCN reaction causing severe rash involving mucus membranes or skin necrosis: No Has patient had a PCN reaction that required hospitalization No Has patient had a PCN reaction occurring within the last 10 years: No If all of the above answers are "NO", then may proceed with Cephalosporin use.    Prednisone Nausea Only and Other (See Comments)    Can take shot, but pill form "caused stomach pain , nausea"     Current Outpatient Medications  Medication Sig Dispense Refill   albuterol (VENTOLIN HFA) 108 (90 Base) MCG/ACT inhaler Inhale 2 puffs into the lungs every 6 (six) hours as needed for wheezing or shortness of breath. 8 g 0   Buprenorphine HCl-Naloxone HCl 8-2 MG FILM Place 2 Film under the tongue daily.      busPIRone (BUSPAR) 15 MG tablet TAKE 2 TABLETS BY MOUTHCTWICE DAILY.     ferrous sulfate 325 (65 FE) MG tablet Take 1 tablet (325 mg total) by mouth 3 (three) times daily with meals. (Patient taking differently: Take 325 mg by mouth daily with breakfast.) 90 tablet 3   furosemide (LASIX) 20 MG tablet TAKE ONE TABLET DAILY AS NEEDED. 90 tablet 0   ondansetron (ZOFRAN-ODT) 4 MG disintegrating tablet Take 1 tablet (  4 mg total) by mouth every 8 (eight) hours as needed for nausea or vomiting. 15 tablet 0   ondansetron (ZOFRAN-ODT) 4 MG disintegrating tablet Take 1 tablet (4 mg total) by mouth every 8 (eight) hours as needed for nausea or vomiting. 20 tablet 0   promethazine-dextromethorphan (PROMETHAZINE-DM) 6.25-15 MG/5ML syrup Take 5 mLs by mouth 4 (four) times daily as needed for cough. 140 mL 0   No current facility-administered medications for this visit.     Past Surgical History:  Procedure Laterality Date   APPENDECTOMY     CHOLECYSTECTOMY     COLONOSCOPY     INCISION AND DRAINAGE ABSCESS Left 04/09/2017   Procedure: INCISION AND DRAINAGE ABSCESS OF NECK;   Surgeon: Helayne Seminole, MD;  Location: [place];  Service: ENT;  Laterality: Left;   LAPAROSCOPIC BILATERAL SALPINGECTOMY Bilateral 12/08/2012   Procedure: LAPAROSCOPIC BILATERAL SALPINGECTOMY;  Surgeon: Jonnie Kind, MD;  Location: [facility];  Service: Gynecology;  Laterality: Bilateral;   MULTIPLE EXTRACTIONS WITH ALVEOLOPLASTY N/A 12/01/2015   Procedure: EXTRACTION TEETH TWO, THREE, FOUR, SIX, SEVEN, EIGHT, NINE, TEN, ELEVEN, TWELVE, FOURTEEN, FIFTEEN, TWENTY, TWENTY ONE, TWENTY EIGHT, TWENTY NINE, THIRTY AND THIRTY ONE WITH ALVEOLOPLASTY;  Surgeon: Diona Browner, DDS;  Location: [place];  Service: Oral Surgery;  Laterality: N/A;   TEE WITHOUT CARDIOVERSION N/A 02/20/2017   Procedure: TRANSESOPHAGEAL ECHOCARDIOGRAM (TEE);  Surgeon: Larey Dresser, MD;  Location: [facility] ENDOSCOPY;  Service: Cardiovascular;  Laterality: N/A;   TEE WITHOUT CARDIOVERSION N/A 04/11/2017   Procedure: TRANSESOPHAGEAL ECHOCARDIOGRAM (TEE);  Surgeon: Dixie Dials, MD;  Location: [facility] ENDOSCOPY;  Service: Cardiovascular;  Laterality: N/A;   TUBAL LIGATION       Allergies  Allergen Reactions   Azithromycin Nausea And Vomiting    ABDOMINAL PAIN   Latex Itching   Nsaids     UNSPECIFIED REACTION     Hydrocodone Nausea And Vomiting   Penicillins Nausea And Vomiting     Has patient had a PCN reaction causing immediate rash, facial/tongue/throat swelling, SOB or lightheadedness with hypotension: No Has patient had a PCN reaction causing severe rash involving mucus membranes or skin necrosis: No Has patient had a PCN reaction that required hospitalization No Has patient had a PCN reaction occurring within the last 10 years: No If all of the above answers are "NO", then may proceed with Cephalosporin use.    Prednisone Nausea Only and Other (See Comments)    Can take shot, but pill form "caused stomach pain , nausea"      Family History  Problem Relation Age of Onset   Ulcerative colitis Paternal Grandmother     Cancer Paternal Grandmother    Colon polyps Paternal Grandfather    Cancer Paternal Grandfather        thyroid   Diabetes Father    Heart disease Father    Kidney disease Father    Hypertension Father      Social History Ms. Riebe reports that she has been smoking cigarettes. She has a 15.00 pack-year smoking history. She has never used smokeless tobacco. Ms. Pla reports that she does not currently use alcohol.   Review of Systems CONSTITUTIONAL: No weight loss, fever, chills, weakness or fatigue.  HEENT: Eyes: No visual loss, blurred vision, double vision or yellow sclerae.No hearing loss, sneezing, congestion, runny nose or sore throat.  SKIN: No rash or itching.  CARDIOVASCULAR: no chest pain, no palpitations RESPIRATORY: SOB at times.   GASTROINTESTINAL: No anorexia, nausea, vomiting or diarrhea. No abdominal  pain or blood.  GENITOURINARY: No burning on urination, no polyuria NEUROLOGICAL: No headache, dizziness, syncope, paralysis, ataxia, numbness or tingling in the extremities. No change in bowel or bladder control.  MUSCULOSKELETAL: No muscle, back pain, joint pain or stiffness.  LYMPHATICS: No enlarged nodes. No history of splenectomy.  PSYCHIATRIC: No history of depression or anxiety.  ENDOCRINOLOGIC: No reports of sweating, cold or heat intolerance. No polyuria or polydipsia.  Marland Kitchen   Physical Examination Today's Vitals   02/12/22 1255  BP: 108/70  Pulse: 79  SpO2: 94%  Weight: 220 lb (99.8 kg)  Height: 5\' 7"  (1.702 m)   Body mass index is 34.46 kg/m.  Gen: resting comfortably, no acute distress HEENT: no scleral icterus, pupils equal round and reactive, no palptable cervical adenopathy,  CV: RRR, no m/rg, no jvd Resp: Clear to auscultation bilaterally GI: abdomen is soft, non-tender, non-distended, normal bowel sounds, no hepatosplenomegaly MSK: extremities are warm, no edema.  Skin: warm, no rash Neuro:  no focal deficits Psych: appropriate  affect   Diagnostic Studies  Jan 2020 echo Study Conclusions   - Left ventricle: The cavity size was normal. Systolic function was    mildly reduced. The estimated ejection fraction was in the range    of 45% to 50%. There is mild hypokinesis of the    basal-midanteroseptal myocardium.  - Right ventricle: The cavity size was moderately to severely    dilated. Wall thickness was normal. Systolic function was mildly    to moderately reduced.  - Right atrium: The atrium was moderately dilated.  - Tricuspid valve: Cannot exclude vegetation. There was moderate    regurgitation.     04/2021 echo IMPRESSIONS     1. Left ventricular ejection fraction, by estimation, is 55 to 60%. The  left ventricle has normal function. The left ventricle has no regional  wall motion abnormalities. Left ventricular diastolic parameters were  normal. The average left ventricular  global longitudinal strain is -22.3 %. The global longitudinal strain is  normal.   2. Right ventricular systolic function is normal. The right ventricular  size is mildly enlarged. There is normal pulmonary artery systolic  pressure. The estimated right ventricular systolic pressure is 84.6 mmHg.   3. The mitral valve is grossly normal. Trivial mitral valve  regurgitation.   4. The tricuspid valve is abnormal. There is mild prolapse of the tip of  the anterior leaflet with associated eccentric tricuspid valve  regurgitation that is moderate.   5. The aortic valve is tricuspid. Aortic valve regurgitation is not  visualized. No aortic stenosis is present. Aortic valve mean gradient  measures 3.0 mmHg.   6. The inferior vena cava is normal in size with greater than 50%  respiratory variability, suggesting right atrial pressure of 3 mmHg.     Assessment and Plan   History of TV endocarditis/Tricuspid regurgitation - recent echo with moderate TR, mild RVE but normal function - some abdominal distension and SOB at times,  change her prn lasix to 40mg   - check bmet/mg in 2 weeks.     Arnoldo Lenis, M.D.

## 2022-03-31 ENCOUNTER — Ambulatory Visit
Admission: EM | Admit: 2022-03-31 | Discharge: 2022-03-31 | Disposition: A | Discharge status: Home / Self Care | Payer: Medicaid Other

## 2022-03-31 ENCOUNTER — Ambulatory Visit (INDEPENDENT_AMBULATORY_CARE_PROVIDER_SITE_OTHER): Payer: Medicaid Other

## 2022-03-31 DIAGNOSIS — J45901 Unspecified asthma with (acute) exacerbation: Secondary | ICD-10-CM | POA: Diagnosis not present

## 2022-03-31 DIAGNOSIS — J069 Acute upper respiratory infection, unspecified: Secondary | ICD-10-CM

## 2022-03-31 DIAGNOSIS — R0602 Shortness of breath: Secondary | ICD-10-CM

## 2022-03-31 DIAGNOSIS — R059 Cough, unspecified: Secondary | ICD-10-CM | POA: Diagnosis not present

## 2022-03-31 MED ORDER — IPRATROPIUM-ALBUTEROL 0.5-2.5 (3) MG/3ML IN SOLN
3.0000 mL | Freq: Once | RESPIRATORY_TRACT | Status: AC
  Administered 2022-03-31: 3 mL via RESPIRATORY_TRACT

## 2022-03-31 MED ORDER — ALBUTEROL SULFATE HFA 108 (90 BASE) MCG/ACT IN AERS: 2.0000 | INHALATION_SPRAY | Freq: Four times a day (QID) | RESPIRATORY_TRACT | 0 refills | Status: DC | PRN

## 2022-03-31 MED ORDER — DEXAMETHASONE SODIUM PHOSPHATE 10 MG/ML IJ SOLN
10.0000 mg | INTRAMUSCULAR | Status: AC
  Administered 2022-03-31: 10 mg via INTRAMUSCULAR

## 2022-03-31 MED ORDER — PREDNISONE 20 MG PO TABS: 40.0000 mg | ORAL_TABLET | Freq: Every day | ORAL | 0 refills | Status: AC

## 2022-03-31 NOTE — Discharge Instructions (Addendum)
Your chest x-ray is normal. Take medication as prescribed.  Continue the Augmentin and Bromfed that you were previously prescribed. Increase fluids and allow for plenty of rest.  Drink at least 8-10 eight  ounce glasses of water daily while symptoms persist. Recommend use of a humidifier in your bedroom at nighttime during sleep and sleeping elevated on pillows while cough symptoms persist. Recommend a brat diet until your appetite improves.  This includes bananas, rice, applesauce, and toast.  You can also try soup, broth, yogurt, pudding, and Jell-O. As discussed, symptoms most likely may be caused by a virus, worsened with your underlying history of asthma.  A viral infection can last anywhere from 7 to 14 days.  If your symptoms suddenly worsen before that time, or extend beyond that time, please follow-up in this clinic or with your primary care physician for further evaluation. If you develop worsening shortness of breath, difficulty breathing, or become unable to speak in a complete sentence, please go to the emergency department immediately. Follow-up as needed.

## 2022-03-31 NOTE — ED Triage Notes (Signed)
Pt reports cough, headache, fatigue and congestion x 5 days. Pt had negative flu, COVID test 2 days ago at another UC. Pt started Augmentin and Bromphen on 03/15/22, reports no relief.

## 2022-03-31 NOTE — ED Provider Notes (Signed)
[facility]    CSN: [id number] Arrival date & time: 03/31/22  1156      History   Chief Complaint Chief Complaint  Patient presents with   Cough    HPI [name] is a 37 y.o. female.   The history is provided by the patient.   The patient presents for continued cough, headache, fatigue, wheezing, and congestion that has been present for the past 5 days.  Patient states 1 day after her symptoms started, she went to a local urgent care.  She states she tested negative for flu and COVID.  Patient states she was started on Augmentin and Bromfed, and has been on the antibiotic for the past 3 days.  She states that she feels like her symptoms are worsening with regard to her cough and fatigue.  Patient also states that she continues to have "no appetite".  Patient denies fever, chills, abdominal pain, nausea, vomiting, or diarrhea.  Patient reports that she does have a history of asthma.  Reports that she has been using her inhaler since her symptoms started.  Past Medical History:  Diagnosis Date   Anemia, unspecified    Anxiety    Asthma    Congestive heart failure (CHF) ([place])    Disc herniation    causes sciatica unsure which disc   Esophageal reflux    Fatty liver    Fibromyalgia    Gastroparesis    Headache(784.0)    Heart murmur    History of narcotic addiction ([place]) 09/30/2012   2010:  Per pt, was addicted to narcotics; mom helped intervene and stopped taking opiods    Hyperemesis gravidarum with metabolic disturbance, unspecified as to episode of care    Irritable bowel syndrome    MSSA bacteremia 02/20/2017   Other and unspecified noninfectious gastroenteritis and colitis(558.9)    Pregnant    PTSD (post-traumatic stress disorder)    Septic arthritis of right sternoclavicular joint ([place]) 02/20/2017   Unspecified asthma(493.90)     Patient Active Problem List   Diagnosis Date Noted   Congestive heart failure (CHF) ([place]) 04/02/2021   Bilateral leg  edema 02/05/2018   Pancytopenia ([place])    Blood per rectum    Hematuria    Chronic diastolic CHF (congestive heart failure) ([place]) 03/07/2017   PFO (patent foramen ovale)    Endocarditis of tricuspid valve    Weakness of both lower extremities    Injection of illicit drug within last 12 months    Fibromyalgia 03/19/2012   GERD 02/02/2009   Irritable bowel syndrome 02/02/2009   COLITIS 04/29/2008   ANXIETY 04/28/2008   ASTHMA 04/28/2008   Gastroparesis 04/28/2008    Past Surgical History:  Procedure Laterality Date   APPENDECTOMY     CHOLECYSTECTOMY     COLONOSCOPY     INCISION AND DRAINAGE ABSCESS Left 04/09/2017   Procedure: INCISION AND DRAINAGE ABSCESS OF NECK;  Surgeon: Helayne Seminole, MD;  Location: [facility];  Service: ENT;  Laterality: Left;   LAPAROSCOPIC BILATERAL SALPINGECTOMY Bilateral 12/08/2012   Procedure: LAPAROSCOPIC BILATERAL SALPINGECTOMY;  Surgeon: Jonnie Kind, MD;  Location: [facility];  Service: Gynecology;  Laterality: Bilateral;   MULTIPLE EXTRACTIONS WITH ALVEOLOPLASTY N/A 12/01/2015   Procedure: EXTRACTION TEETH TWO, THREE, FOUR, SIX, SEVEN, EIGHT, NINE, TEN, ELEVEN, TWELVE, FOURTEEN, FIFTEEN, TWENTY, TWENTY ONE, TWENTY EIGHT, TWENTY NINE, THIRTY AND THIRTY ONE WITH ALVEOLOPLASTY;  Surgeon: Diona Browner, DDS;  Location: [facility];  Service: Oral Surgery;  Laterality: N/A;  TEE WITHOUT CARDIOVERSION N/A 02/20/2017   Procedure: TRANSESOPHAGEAL ECHOCARDIOGRAM (TEE);  Surgeon: Larey Dresser, MD;  Location[facility];  Service: Cardiovascular;  Laterality: N/A;   TEE WITHOUT CARDIOVERSION N/A 04/11/2017   Procedure: TRANSESOPHAGEAL ECHOCARDIOGRAM (TEE);  Surgeon: Dixie Dials, MD;  Location: [facility] ENDOSCOPY;  Service: Cardiovascular;  Laterality: N/A;   TUBAL LIGATION      OB History     Gravida  3   Para  3   Term  3   Preterm      AB      Living  3      SAB      IAB      Ectopic      Multiple      Live Births  3            Home  Medications    Prior to Admission medications   Medication Sig Start Date End Date Taking? Authorizing Provider  albuterol (VENTOLIN HFA) 108 (90 Base) MCG/ACT inhaler Inhale 2 puffs into the lungs every 6 (six) hours as needed for wheezing or shortness of breath. 03/31/22  Yes Voula Waln-Warren, Alda Lea, NP  amoxicillin-clavulanate (AUGMENTIN) 875-125 MG tablet SMARTSIG:1 Tablet(s) By Mouth Every 12 Hours 03/28/22  Yes [provider]  brompheniramine-pseudoephedrine-DM 30-2-10 MG/5ML syrup SMARTSIG:10 Milliliter(s) By Mouth Every 12 Hours PRN 03/28/22  Yes [provider]  predniSONE (DELTASONE) 20 MG tablet Take 2 tablets (40 mg total) by mouth daily with breakfast for 5 days. 03/31/22 04/05/22 Yes Abegail Kloeppel-Warren, Alda Lea, NP  Buprenorphine HCl-Naloxone HCl 8-2 MG FILM Place 2 Film under the tongue daily.     [provider]  busPIRone (BUSPAR) 15 MG tablet TAKE 2 TABLETS BY MOUTHCTWICE DAILY. 08/26/19   [provider]  furosemide (LASIX) 40 MG tablet Take 1 tablet (40 mg total) by mouth as needed for edema (swelling). 02/12/22   Arnoldo Lenis, MD  venlafaxine XR (EFFEXOR-XR) 37.5 MG 24 hr capsule Take 37.5 mg by mouth 2 (two) times daily. 09/11/21   [provider]    Family History Family History  Problem Relation Age of Onset   Ulcerative colitis Paternal Grandmother    Cancer Paternal Grandmother    Colon polyps Paternal Grandfather    Cancer Paternal Grandfather        thyroid   Diabetes Father    Heart disease Father    Kidney disease Father    Hypertension Father     Social History Social History   Tobacco Use   Smoking status: Every Day    Packs/day: 1.50    Years: 10.00    Additional pack years: 0.00    Total pack years: 15.00    Types: Cigarettes   Smokeless tobacco: Never  Vaping Use   Vaping Use: Never used  Substance Use Topics   Alcohol use: Not Currently    Comment: on weekends   Drug use: No    Types: IV     Comment: pt denies 11/30/15     Allergies   Azithromycin, Latex, Nsaids, Hydrocodone, Penicillins, and Prednisone   Review of Systems Review of Systems Per HPI  Physical Exam Triage Vital Signs ED Triage Vitals [03/31/22 1200]  Enc Vitals Group     BP 105/73     Pulse Rate 94     Resp 20     Temp 98.2 F (36.8 C)     Temp Source Oral     SpO2 94 %  Weight      Height      Head Circumference      Peak Flow      Pain Score      Pain Loc      Pain Edu?      Excl. in [place]?    No data found.  Updated Vital Signs BP 105/73 (BP Location: Right Arm)   Pulse 83   Temp 98.2 F (36.8 C) (Oral)   Resp 20   LMP  (Within Weeks) Comment: 2-3 weeks  SpO2 95%   Visual Acuity Right Eye Distance:   Left Eye Distance:   Bilateral Distance:    Right Eye Near:   Left Eye Near:    Bilateral Near:     Physical Exam Vitals and nursing note reviewed.  Constitutional:      General: She is not in acute distress.    Appearance: Normal appearance.  HENT:     Head: Normocephalic.     Right Ear: Tympanic membrane, ear canal and external ear normal.     Left Ear: Tympanic membrane, ear canal and external ear normal.     Nose: Congestion present.     Mouth/Throat:     Mouth: Mucous membranes are moist.     Pharynx: Posterior oropharyngeal erythema present.     Comments: Cobblestoning present on posterior oropharynx Eyes:     Extraocular Movements: Extraocular movements intact.     Conjunctiva/sclera: Conjunctivae normal.     Pupils: Pupils are equal, round, and reactive to light.  Cardiovascular:     Rate and Rhythm: Normal rate and regular rhythm.     Pulses: Normal pulses.     Heart sounds: Normal heart sounds.  Pulmonary:     Breath sounds: Wheezing (Expiratory wheezing noted throughout.  Rhonchi also noted, but clears with coughing.) and rhonchi present.     Comments: Patient is in no acute distress, she is speaking in complete sentences without difficulty. Abdominal:      General: Bowel sounds are normal.     Palpations: Abdomen is soft.     Tenderness: There is no abdominal tenderness.  Musculoskeletal:     Cervical back: Normal range of motion.  Skin:    General: Skin is warm and dry.  Neurological:     General: No focal deficit present.     Mental Status: She is alert and oriented to person, place, and time.  Psychiatric:        Mood and Affect: Mood normal.        Behavior: Behavior normal.      UC Treatments / Results  Labs (all labs ordered are listed, but only abnormal results are displayed) Labs Reviewed - No data to display  EKG   Radiology DG Chest 2 View  Result Date: 03/31/2022 CLINICAL DATA:  Shortness of breath and wheezing. History of asthma. Cough 10 days. EXAM: CHEST - 2 VIEW COMPARISON:  09/20/2020 FINDINGS: Lungs are adequately inflated without focal airspace consolidation or effusion. Cardiomediastinal silhouette and remainder of the exam is unchanged. IMPRESSION: No active cardiopulmonary disease. Electronically Signed   By: Marin Olp M.D.   On: 03/31/2022 12:44    Procedures Procedures (including critical care time)  Medications Ordered in UC Medications  dexamethasone (DECADRON) injection 10 mg (10 mg Intramuscular Given 03/31/22 1239)  ipratropium-albuterol (DUONEB) 0.5-2.5 (3) MG/3ML nebulizer solution 3 mL (3 mLs Nebulization Given 03/31/22 1239)    Initial Impression / Assessment and Plan / UC Course  I have  reviewed the triage vital signs and the nursing notes.  Pertinent labs & imaging results that were available during my care of the patient were reviewed by me and considered in my medical decision making (see chart for details).  The patient is well-appearing, she is in no acute distress, vital signs are stable.  Patient presents for continued cough, chest congestion, wheezing, fatigue, and decreased appetite.  Patient with a history of asthma.  It appears symptoms most likely are being exacerbated by  her underlying asthma.  Patient was given Decadron 10 mg IM and a DuoNeb in the clinic.  Patient is currently taking Augmentin.  Will have patient continue Augmentin at this time.  Patient was prescribed prednisone 50 mg for the next 5 days to help with bronchial inflammation and for her asthma.  Patient's chart side effect of nausea due to to prednisone, in this case, benefit outweighs the risk, patient is in agreement.  Patient was also given a refill of her albuterol inhaler to use as needed for shortness of breath or wheezing.  Discussed that symptoms may be viral, which is why she has not noted an improvement of her symptoms.  Discussed viral etiology with the patient.  Patient is in agreement with this plan of care and verbalizes understanding.  All questions were answered.  Patient stable for discharge.   Final Clinical Impressions(s) / UC Diagnoses   Final diagnoses:  Asthma with acute exacerbation, unspecified asthma severity, unspecified whether persistent  Viral upper respiratory tract infection with cough     Discharge Instructions      Your chest x-ray is normal. Take medication as prescribed.  Continue the Augmentin and Bromfed that you were previously prescribed. Increase fluids and allow for plenty of rest.  Drink at least 8-10 eight  ounce glasses of water daily while symptoms persist. Recommend use of a humidifier in your bedroom at nighttime during sleep and sleeping elevated on pillows while cough symptoms persist. Recommend a brat diet until your appetite improves.  This includes bananas, rice, applesauce, and toast.  You can also try soup, broth, yogurt, pudding, and Jell-O. As discussed, symptoms most likely may be caused by a virus, worsened with your underlying history of asthma.  A viral infection can last anywhere from 7 to 14 days.  If your symptoms suddenly worsen before that time, or extend beyond that time, please follow-up in this clinic or with your primary care  physician for further evaluation. If you develop worsening shortness of breath, difficulty breathing, or become unable to speak in a complete sentence, please go to the emergency department immediately. Follow-up as needed.     ED Prescriptions     Medication Sig Dispense Auth. Provider   albuterol (VENTOLIN HFA) 108 (90 Base) MCG/ACT inhaler Inhale 2 puffs into the lungs every 6 (six) hours as needed for wheezing or shortness of breath. 8 g Myeshia Fojtik-Warren, Alda Lea, NP   predniSONE (DELTASONE) 20 MG tablet Take 2 tablets (40 mg total) by mouth daily with breakfast for 5 days. 10 tablet Wlliam Grosso-Warren, Alda Lea, NP      PDMP not reviewed this encounter.   Tish Men, NP 03/31/22 1304

## 2022-04-30 ENCOUNTER — Encounter: Payer: Self-pay | Admitting: Emergency Medicine

## 2022-04-30 ENCOUNTER — Ambulatory Visit (INDEPENDENT_AMBULATORY_CARE_PROVIDER_SITE_OTHER): Payer: Medicaid Other | Admitting: Ophthalmology

## 2022-04-30 ENCOUNTER — Ambulatory Visit
Admission: EM | Admit: 2022-04-30 | Discharge: 2022-04-30 | Disposition: A | Discharge status: Home / Self Care | Payer: Medicaid Other | Attending: Family Medicine | Admitting: Family Medicine

## 2022-04-30 ENCOUNTER — Other Ambulatory Visit: Payer: Self-pay

## 2022-04-30 ENCOUNTER — Encounter (INDEPENDENT_AMBULATORY_CARE_PROVIDER_SITE_OTHER): Payer: Self-pay | Admitting: Ophthalmology

## 2022-04-30 DIAGNOSIS — H16001 Unspecified corneal ulcer, right eye: Secondary | ICD-10-CM

## 2022-04-30 DIAGNOSIS — H5711 Ocular pain, right eye: Secondary | ICD-10-CM

## 2022-04-30 DIAGNOSIS — H3581 Retinal edema: Secondary | ICD-10-CM

## 2022-04-30 DIAGNOSIS — H169 Unspecified keratitis: Secondary | ICD-10-CM | POA: Diagnosis not present

## 2022-04-30 MED ORDER — OFLOXACIN 0.3 % OP SOLN: 1.0000 [drp] | Freq: Every day | OPHTHALMIC | 0 refills | Status: AC

## 2022-04-30 NOTE — Progress Notes (Addendum)
[facility] - Clinic Note  04/30/2022   CHIEF COMPLAINT Patient presents for Retina Evaluation  HISTORY OF PRESENT ILLNESS: [name] is a 37 y.o. female who presents to the clinic today for:  HPI     Retina Evaluation   In right eye.  This started 1 day ago.  Associated Symptoms Pain and Redness.  Negative for Flashes, Floaters, Distortion, Blind Spot, Photophobia, Glare, Trauma, Scalp Tenderness, Jaw Claudication, Shoulder/Hip pain, Fever, Weight Loss and Fatigue.  Treatments tried include no treatments.  Response to treatment was mild improvement.  I, the attending physician,  performed the HPI with the patient and updated documentation appropriately.        Comments   Retina eval per [facility]. Patient states od is sore and watery with pain times 1 day. Last eye exam 10 years ago. Patient over wears contact lenses. Patient has no contacts but has been wearing her Mothers.Patient only wanted od dialted.      Last edited by Rennis Chris, MD on 04/30/2022  8:45 PM.    Pt is here on referral of [facility], pt states her right eye is sore, she was up half the night with pain and light sensitivity, pt states she has medicaid, which does not cover CL exams or CL, so pt is wearing her mothers CL bc she was once told that their vision is about the same, pts states the CL are dailies, she states she has had this same problem multiple times, she normally just gets a bottle of antibiotic eye drops which help   Referring physician: [facility], [facility] [address] [place],  [place] [address]  HISTORICAL INFORMATION:  Selected notes from the MEDICAL RECORD NUMBER Urgent Care f/u LEE:  Ocular Hx- PMH-   CURRENT MEDICATIONS: Current Outpatient Medications (Ophthalmic Drugs)  Medication Sig   ofloxacin (OCUFLOX) 0.3 % ophthalmic solution Place 1 drop into the right eye 6 (six) times daily for 10 days.   No current facility-administered  medications for this visit. (Ophthalmic Drugs)   Current Outpatient Medications (Other)  Medication Sig   albuterol (VENTOLIN HFA) 108 (90 Base) MCG/ACT inhaler Inhale 2 puffs into the lungs every 6 (six) hours as needed for wheezing or shortness of breath.   Buprenorphine HCl-Naloxone HCl 8-2 MG FILM Place 2 Film under the tongue daily.    busPIRone (BUSPAR) 15 MG tablet TAKE 2 TABLETS BY MOUTHCTWICE DAILY.   furosemide (LASIX) 40 MG tablet Take 1 tablet (40 mg total) by mouth as needed for edema (swelling).   venlafaxine XR (EFFEXOR-XR) 37.5 MG 24 hr capsule Take 37.5 mg by mouth 2 (two) times daily.   brompheniramine-pseudoephedrine-DM 30-2-10 MG/5ML syrup SMARTSIG:10 Milliliter(s) By Mouth Every 12 Hours PRN (Patient not taking: Reported on 04/30/2022)   No current facility-administered medications for this visit. (Other)   REVIEW OF SYSTEMS: ROS   Positive for: Gastrointestinal, Cardiovascular, Eyes, Respiratory Negative for: Constitutional, Neurological, Skin, Genitourinary, Musculoskeletal, HENT, Endocrine, Psychiatric, Heme/Lymph Last edited by Lana Fish, COT on 04/30/2022  9:49 AM.     ALLERGIES Allergies  Allergen Reactions   Azithromycin Nausea And Vomiting    ABDOMINAL PAIN   Latex Itching   Nsaids     UNSPECIFIED REACTION     Hydrocodone Nausea And Vomiting   Penicillins Nausea And Vomiting     Has patient had a PCN reaction causing immediate rash, facial/tongue/throat swelling, SOB or lightheadedness with hypotension: No Has patient had a  PCN reaction causing severe rash involving mucus membranes or skin necrosis: No Has patient had a PCN reaction that required hospitalization No Has patient had a PCN reaction occurring within the last 10 years: No If all of the above answers are "NO", then may proceed with Cephalosporin use.    Prednisone Nausea Only and Other (See Comments)    Can take shot, but pill form "caused stomach pain , nausea"   PAST MEDICAL  HISTORY Past Medical History:  Diagnosis Date   Anemia, unspecified    Anxiety    Asthma    Congestive heart failure (CHF)    Disc herniation    causes sciatica unsure which disc   Esophageal reflux    Fatty liver    Fibromyalgia    Gastroparesis    Headache(784.0)    Heart murmur    History of narcotic addiction 09/30/2012   2010:  Per pt, was addicted to narcotics; mom helped intervene and stopped taking opiods    Hyperemesis gravidarum with metabolic disturbance, unspecified as to episode of care    Irritable bowel syndrome    MSSA bacteremia 02/20/2017   Other and unspecified noninfectious gastroenteritis and colitis(558.9)    Pregnant    PTSD (post-traumatic stress disorder)    Septic arthritis of right sternoclavicular joint 02/20/2017   Unspecified asthma(493.90)    Past Surgical History:  Procedure Laterality Date   APPENDECTOMY     CHOLECYSTECTOMY     COLONOSCOPY     INCISION AND DRAINAGE ABSCESS Left 04/09/2017   Procedure: INCISION AND DRAINAGE ABSCESS OF NECK;  Surgeon: Graylin Shiver, MD;  Location: [facility] OR;  Service: ENT;  Laterality: Left;   LAPAROSCOPIC BILATERAL SALPINGECTOMY Bilateral 12/08/2012   Procedure: LAPAROSCOPIC BILATERAL SALPINGECTOMY;  Surgeon: Tilda Burrow, MD;  Location: [facility] ORS;  Service: Gynecology;  Laterality: Bilateral;   MULTIPLE EXTRACTIONS WITH ALVEOLOPLASTY N/A 12/01/2015   Procedure: EXTRACTION TEETH TWO, THREE, FOUR, SIX, SEVEN, EIGHT, NINE, TEN, ELEVEN, TWELVE, FOURTEEN, FIFTEEN, TWENTY, TWENTY ONE, TWENTY EIGHT, TWENTY NINE, THIRTY AND THIRTY ONE WITH ALVEOLOPLASTY;  Surgeon: Ocie Doyne, DDS;  Location: [facility] OR;  Service: Oral Surgery;  Laterality: N/A;   TEE WITHOUT CARDIOVERSION N/A 02/20/2017   Procedure: TRANSESOPHAGEAL ECHOCARDIOGRAM (TEE);  Surgeon: Laurey Morale, MD;  Location: [facility] ENDOSCOPY;  Service: Cardiovascular;  Laterality: N/A;   TEE WITHOUT CARDIOVERSION N/A 04/11/2017   Procedure: TRANSESOPHAGEAL ECHOCARDIOGRAM (TEE);   Surgeon: Orpah Cobb, MD;  Location: [facility] ENDOSCOPY;  Service: Cardiovascular;  Laterality: N/A;   TUBAL LIGATION     FAMILY HISTORY Family History  Problem Relation Age of Onset   Ulcerative colitis Paternal Grandmother    Cancer Paternal Grandmother    Colon polyps Paternal Grandfather    Cancer Paternal Grandfather        thyroid   Diabetes Father    Heart disease Father    Kidney disease Father    Hypertension Father    SOCIAL HISTORY Social History   Tobacco Use   Smoking status: Every Day    Packs/day: 1.50    Years: 10.00    Additional pack years: 0.00    Total pack years: 15.00    Types: Cigarettes   Smokeless tobacco: Never  Vaping Use   Vaping Use: Never used  Substance Use Topics   Alcohol use: Not Currently    Comment: on weekends   Drug use: No    Types: IV    Comment: pt denies 11/30/15       OPHTHALMIC EXAM:  Base Eye Exam     Visual Acuity (Snellen - Linear)       Right Left   Dist [place] 20/CF    Dist cc  20/20-1   Dist ph [place] 20/200-1          Pupils       Dark Light Shape React APD   Right 3 2 Round Minimal None   Left 3 2 Round Minimal None         Visual Fields (Counting fingers)       Left Right    Full Full         Extraocular Movement       Right Left    Full, Ortho Full, Ortho         Dilation     Right eye: 1.0% Mydriacyl, 2.5% Phenylephrine @ 9:51 AM           Slit Lamp and Fundus Exam     Slit Lamp Exam       Right Left   Lids/Lashes Mild telangiectasia, Meibomian gland dysfunction Mild telangiectasia, mild MGD   Conjunctiva/Sclera 1+ perilimbal Injection White and quiet   Cornea multiple sub epi scars, trace PEE, punctate corneal ulcer with infiltrate IT paracentral (approx 0.1mm) BCL in place, tear film debris, old sub epi scar SN midzone   Anterior Chamber deep and clear deep and clear   Iris Round and dilated Round and reactive   Lens Clear Clear   Anterior Vitreous Vitreous syneresis  Vitreous syneresis         Fundus Exam       Right Left   Disc Pink and Sharp Pink and Sharp   C/D Ratio 0.4 0.4   Macula Flat, Good foveal reflex, No heme or edema Flat, Good foveal reflex, No heme or edema   Vessels Normal Normal   Periphery Attached Attached, limited view           Refraction     Manifest Refraction (Auto)       Sphere Cylinder Axis Dist VA   Right -7.50 +0.75 141 20/30-3   Left               IMAGING AND PROCEDURES  Imaging and Procedures for 04/30/2022  OCT, Retina - OU - Both Eyes       Right Eye Quality was good. Central Foveal Thickness: 282. Progression has no prior data. Findings include normal foveal contour, no IRF, no SRF, vitreomacular adhesion .   Left Eye Quality was good. Central Foveal Thickness: 285. Progression has no prior data. Findings include normal foveal contour, no IRF, no SRF, vitreomacular adhesion .   Notes *Images captured and stored on drive  Diagnosis / Impression:  NFP, no IRF/SRF OU  Clinical management:  See below  Abbreviations: NFP - Normal foveal profile. CME - cystoid macular edema. PED - pigment epithelial detachment. IRF - intraretinal fluid. SRF - subretinal fluid. EZ - ellipsoid zone. ERM - epiretinal membrane. ORA - outer retinal atrophy. ORT - outer retinal tubulation. SRHM - subretinal hyper-reflective material. IRHM - intraretinal hyper-reflective material           ASSESSMENT/PLAN:   ICD-10-CM   1. 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 Retinal edema  H35.81 OCT, Retina - OU - Both Eyes     Corneal Ulcer OD - pt with history of multiple contact lens related ulcers OU -- treated through ED / Urgent Care visits - pt  does not have a regular eye doctor, but wears her mother's daily CL and occasionally sleeps in them  - pt presented to [facility] earlier today with pain and photophobia OD x1 day  - BCVA 20/30 OD  - exam shows punctate corneal ulcer with +infiltrate  -  will start ofloxacin 6x/day OD  - avoid contact lens use  - reviewed proper CL safety and hygiene  - f/u in 1 wk (next Weds)  2. No retinal edema on exam or OCT   Ophthalmic Meds Ordered this visit:  Meds ordered this encounter  Medications   ofloxacin (OCUFLOX) 0.3 % ophthalmic solution    Sig: Place 1 drop into the right eye 6 (six) times daily for 10 days.    Dispense:  10 mL    Refill:  0     Return in about 8 days (around 05/08/2022) for f/u corneal ulcer OD, DFE, OCT.  There are no Patient Instructions on file for this visit.  Explained the diagnoses, plan, and follow up with the patient and they expressed understanding.  Patient expressed understanding of the importance of proper follow up care.   This document serves as a record of services personally performed by Karie Chimera, MD, PhD. It was created on their behalf by Glee Arvin. Manson Passey, OA an ophthalmic technician. The creation of this record is the provider's dictation and/or activities during the visit.    Electronically signed by: Glee Arvin. Kristopher Oppenheim 04.16.2024 8:46 PM  Karie Chimera, M.D., Ph.D. Diseases & Surgery of the Retina and Vitreous [facility] 04/30/2022  I have reviewed the above documentation for accuracy and completeness, and I agree with the above. Karie Chimera, M.D., Ph.D. 04/30/22 8:58 PM   Abbreviations: M myopia (nearsighted); A astigmatism; H hyperopia (farsighted); P presbyopia; Mrx spectacle prescription;  CTL contact lenses; OD right eye; OS left eye; OU both eyes  XT exotropia; ET esotropia; PEK punctate epithelial keratitis; PEE punctate epithelial erosions; DES dry eye syndrome; MGD meibomian gland dysfunction; ATs artificial tears; PFAT's preservative free artificial tears; NSC nuclear sclerotic cataract; PSC posterior subcapsular cataract; ERM epi-retinal membrane; PVD posterior vitreous detachment; RD retinal detachment; DM diabetes mellitus; DR diabetic  retinopathy; NPDR non-proliferative diabetic retinopathy; PDR proliferative diabetic retinopathy; CSME clinically significant macular edema; DME diabetic macular edema; dbh dot blot hemorrhages; CWS cotton wool spot; POAG primary open angle glaucoma; C/D cup-to-disc ratio; HVF humphrey visual field; GVF goldmann visual field; OCT optical coherence tomography; IOP intraocular pressure; BRVO Branch retinal vein occlusion; CRVO central retinal vein occlusion; CRAO central retinal artery occlusion; BRAO branch retinal artery occlusion; RT retinal tear; SB scleral buckle; PPV pars plana vitrectomy; VH Vitreous hemorrhage; PRP panretinal laser photocoagulation; IVK intravitreal kenalog; VMT vitreomacular traction; MH Macular hole;  NVD neovascularization of the disc; NVE neovascularization elsewhere; AREDS age related eye disease study; ARMD age related macular degeneration; POAG primary open angle glaucoma; EBMD epithelial/anterior basement membrane dystrophy; ACIOL anterior chamber intraocular lens; IOL intraocular lens; PCIOL posterior chamber intraocular lens; Phaco/IOL phacoemulsification with intraocular lens placement; PRK photorefractive keratectomy; LASIK laser assisted in situ keratomileusis; HTN hypertension; DM diabetes mellitus; COPD chronic obstructive pulmonary disease

## 2022-04-30 NOTE — ED Triage Notes (Signed)
Pt reports right eye redness and pain/light sensitivity since yesterday. Pt reports usually wears contacts but reports has taken right one out. Reports history of similar. Denies any known injury but reports was working in the yard this weekend.

## 2022-04-30 NOTE — ED Provider Notes (Signed)
[facility]   [id number] 04/30/22 Arrival Time: 0800  ASSESSMENT & PLAN:  1. Acute right eye pain   2. Keratitis, right    Spoke with Dr. Vanessa Barbara on-call for ophthalmology. He will see her now.  Pt discharged to his office by POV.   Reviewed expectations re: course of current medical issues. Questions answered. Outlined signs and symptoms indicating need for more acute intervention. Patient verbalized understanding. After Visit Summary given.   SUBJECTIVE:  [name] is a 37 y.o. female who presents with complaint of R eye pain; noted last evening and removed her contact lens. Pain worse this morning. Watery drainage. Much light sensitivity. Denies eye trauma. No tx PTA.  OBJECTIVE:  Vitals:   04/30/22 0813  BP: 121/76  Pulse: 67  Resp: 20  Temp: 97.8 F (36.6 C)  TempSrc: Oral  SpO2: 95%    General appearance: alert; no distress HEENT: [place]; AT; PERRLA; no restriction of the extraocular movements OS: normal OD: with reported pain; with mild conjunctival injection; with watery drainage; with pin point fluorescein uptake of central cornea; I cannot appreciated any foreign body; with limbal flush; without periorbital swelling or erythema Neck: supple without LAD Skin: warm and dry Psychological: alert and cooperative; normal mood and affect  Difficult to get an accurate visual acuity secondary to reported eye pain and light sensitivity.  Allergies  Allergen Reactions   Azithromycin Nausea And Vomiting    ABDOMINAL PAIN   Latex Itching   Nsaids     UNSPECIFIED REACTION     Hydrocodone Nausea And Vomiting   Penicillins Nausea And Vomiting     Has patient had a PCN reaction causing immediate rash, facial/tongue/throat swelling, SOB or lightheadedness with hypotension: No Has patient had a PCN reaction causing severe rash involving mucus membranes or skin necrosis: No Has patient had a PCN reaction that required hospitalization No Has patient had a  PCN reaction occurring within the last 10 years: No If all of the above answers are "NO", then may proceed with Cephalosporin use.    Prednisone Nausea Only and Other (See Comments)    Can take shot, but pill form "caused stomach pain , nausea"    Past Medical History:  Diagnosis Date   Anemia, unspecified    Anxiety    Asthma    Congestive heart failure (CHF)    Disc herniation    causes sciatica unsure which disc   Esophageal reflux    Fatty liver    Fibromyalgia    Gastroparesis    Headache(784.0)    Heart murmur    History of narcotic addiction 09/30/2012   2010:  Per pt, was addicted to narcotics; mom helped intervene and stopped taking opiods    Hyperemesis gravidarum with metabolic disturbance, unspecified as to episode of care    Irritable bowel syndrome    MSSA bacteremia 02/20/2017   Other and unspecified noninfectious gastroenteritis and colitis(558.9)    Pregnant    PTSD (post-traumatic stress disorder)    Septic arthritis of right sternoclavicular joint 02/20/2017   Unspecified asthma(493.90)    Social History   Socioeconomic History   Marital status: Single    Spouse name: Not on file   Number of children: 2   Years of education: Not on file   Highest education level: Not on file  Occupational History   Occupation: CNA  Tobacco Use   Smoking status: Every Day    Packs/day: 1.50    Years: 10.00  Additional pack years: 0.00    Total pack years: 15.00    Types: Cigarettes   Smokeless tobacco: Never  Vaping Use   Vaping Use: Never used  Substance and Sexual Activity   Alcohol use: Not Currently    Comment: on weekends   Drug use: No    Types: IV    Comment: pt denies 11/30/15   Sexual activity: Yes    Birth control/protection: Surgical    Comment: tubal  Other Topics Concern   Not on file  Social History Narrative   Daily caffeine    Social Determinants of Health   Financial Resource Strain: Low Risk  (09/23/2019)   Overall Financial Resource  Strain (CARDIA)    Difficulty of Paying Living Expenses: Not hard at all  Food Insecurity: No Food Insecurity (09/23/2019)   Hunger Vital Sign    Worried About Running Out of Food in the Last Year: Never true    Ran Out of Food in the Last Year: Never true  Transportation Needs: No Transportation Needs (09/23/2019)   PRAPARE - Administrator, Civil Service (Medical): No    Lack of Transportation (Non-Medical): No  Physical Activity: Insufficiently Active (09/23/2019)   Exercise Vital Sign    Days of Exercise per Week: 2 days    Minutes of Exercise per Session: 30 min  Stress: Stress Concern Present (09/23/2019)   [employer] of Occupational Health - Occupational Stress Questionnaire    Feeling of Stress : Very much  Social Connections: Moderately Isolated (09/23/2019)   Social Connection and Isolation Panel [NHANES]    Frequency of Communication with Friends and Family: More than three times a week    Frequency of Social Gatherings with Friends and Family: Once a week    Attends Religious Services: More than 4 times per year    Active Member of [employer] or Organizations: No    Attends Banker Meetings: Never    Marital Status: Never married  Intimate Partner Violence: Not At Risk (09/23/2019)   Humiliation, Afraid, Rape, and Kick questionnaire    Fear of Current or Ex-Partner: No    Emotionally Abused: No    Physically Abused: No    Sexually Abused: No   Family History  Problem Relation Age of Onset   Ulcerative colitis Paternal Grandmother    Cancer Paternal Grandmother    Colon polyps Paternal Grandfather    Cancer Paternal Grandfather        thyroid   Diabetes Father    Heart disease Father    Kidney disease Father    Hypertension Father    Past Surgical History:  Procedure Laterality Date   APPENDECTOMY     CHOLECYSTECTOMY     COLONOSCOPY     INCISION AND DRAINAGE ABSCESS Left 04/09/2017   Procedure: INCISION AND DRAINAGE ABSCESS OF NECK;  Surgeon:  Graylin Shiver, MD;  Location: [facility] OR;  Service: ENT;  Laterality: Left;   LAPAROSCOPIC BILATERAL SALPINGECTOMY Bilateral 12/08/2012   Procedure: LAPAROSCOPIC BILATERAL SALPINGECTOMY;  Surgeon: Tilda Burrow, MD;  Location: [facility]S;  Service: Gynecology;  Laterality: Bilateral;   MULTIPLE EXTRACTIONS WITH ALVEOLOPLASTY N/A 12/01/2015   Procedure: EXTRACTION TEETH TWO, THREE, FOUR, SIX, SEVEN, EIGHT, NINE, TEN, ELEVEN, TWELVE, FOURTEEN, FIFTEEN, TWENTY, TWENTY ONE, TWENTY EIGHT, TWENTY NINE, THIRTY AND THIRTY ONE WITH ALVEOLOPLASTY;  Surgeon: Ocie Doyne, DDS;  Location: [facility] OR;  Service: Oral Surgery;  Laterality: N/A;   TEE WITHOUT CARDIOVERSION N/A 02/20/2017   Procedure: TRANSESOPHAGEAL  ECHOCARDIOGRAM (TEE);  Surgeon: Laurey Morale, MD;  Location: [facility] ENDOSCOPY;  Service: Cardiovascular;  Laterality: N/A;   TEE WITHOUT CARDIOVERSION N/A 04/11/2017   Procedure: TRANSESOPHAGEAL ECHOCARDIOGRAM (TEE);  Surgeon: Orpah Cobb, MD;  Location: [facility] ENDOSCOPY;  Service: Cardiovascular;  Laterality: N/A;   TUBAL Mauricia Area, MD 04/30/22 [phone]

## 2022-05-08 ENCOUNTER — Encounter (INDEPENDENT_AMBULATORY_CARE_PROVIDER_SITE_OTHER): Payer: Medicaid Other | Admitting: Ophthalmology

## 2022-08-01 ENCOUNTER — Ambulatory Visit: Payer: Medicaid Other | Admitting: Cardiology

## 2022-08-01 NOTE — Progress Notes (Deleted)
Clinical Summary Ms. Riordan is a 37 y.o.female  seen today as a new consult, referred by NP Hyler   History of endocarditis TV -prevoiusly followed by Dr Algie Coffer - 03/2017 admission with MSSA bacteremia. Has history of IV drug use - 03/2017 TEE: mobile vegetation TV, severe TR, mild MR, +PFP - was to follow up with CT surgery outpatient, I don't see an inpatient consult note though Dr Roseanne Kaufman note mentions she was not a candidate for TVR per CT surgery - from 06/26/2017 clinic visit with Dr Tyrone Sage was no indication for surgery  at the time.    Jan 2020 echo mod TR. RV moderately to severely dilated, mild to mod RV dysfunction 04/2021 echo: LVEF 55-60%, normal RV function with mild enlargement, normal PASP, moderate TR - has lasix 20mg  prn, was taking about once a week. Some abdominal distension at times   04/2021 echo: LVEF 55-60%, no WMAs, normal diastolic pressure, normal RV function, mild RVE, PASP 30, mod TR.    SH: works Education officer, environmental [employer]   Past Medical History:  Diagnosis Date   Anemia, unspecified    Anxiety    Asthma    Congestive heart failure (CHF) (HCC)    Disc herniation    causes sciatica unsure which disc   Esophageal reflux    Fatty liver    Fibromyalgia    Gastroparesis    Headache(784.0)    Heart murmur    History of narcotic addiction (HCC) 09/30/2012   2010:  Per pt, was addicted to narcotics; mom helped intervene and stopped taking opiods    Hyperemesis gravidarum with metabolic disturbance, unspecified as to episode of care    Irritable bowel syndrome    MSSA bacteremia 02/20/2017   Other and unspecified noninfectious gastroenteritis and colitis(558.9)    Pregnant    PTSD (post-traumatic stress disorder)    Septic arthritis of right sternoclavicular joint (HCC) 02/20/2017   Unspecified asthma(493.90)      Allergies  Allergen Reactions   Azithromycin Nausea And Vomiting    ABDOMINAL PAIN   Latex Itching   Nsaids     UNSPECIFIED  REACTION     Hydrocodone Nausea And Vomiting   Penicillins Nausea And Vomiting     Has patient had a PCN reaction causing immediate rash, facial/tongue/throat swelling, SOB or lightheadedness with hypotension: No Has patient had a PCN reaction causing severe rash involving mucus membranes or skin necrosis: No Has patient had a PCN reaction that required hospitalization No Has patient had a PCN reaction occurring within the last 10 years: No If all of the above answers are "NO", then may proceed with Cephalosporin use.    Prednisone Nausea Only and Other (See Comments)    Can take shot, but pill form "caused stomach pain , nausea"     Current Outpatient Medications  Medication Sig Dispense Refill   albuterol (VENTOLIN HFA) 108 (90 Base) MCG/ACT inhaler Inhale 2 puffs into the lungs every 6 (six) hours as needed for wheezing or shortness of breath. 8 g 0   brompheniramine-pseudoephedrine-DM 30-2-10 MG/5ML syrup SMARTSIG:10 Milliliter(s) By Mouth Every 12 Hours PRN (Patient not taking: Reported on 04/30/2022)     Buprenorphine HCl-Naloxone HCl 8-2 MG FILM Place 2 Film under the tongue daily.      busPIRone (BUSPAR) 15 MG tablet TAKE 2 TABLETS BY MOUTHCTWICE DAILY.     furosemide (LASIX) 40 MG tablet Take 1 tablet (40 mg total) by mouth as needed for edema (swelling). 30  tablet 2   venlafaxine XR (EFFEXOR-XR) 37.5 MG 24 hr capsule Take 37.5 mg by mouth 2 (two) times daily.     No current facility-administered medications for this visit.     Past Surgical History:  Procedure Laterality Date   APPENDECTOMY     CHOLECYSTECTOMY     COLONOSCOPY     INCISION AND DRAINAGE ABSCESS Left 04/09/2017   Procedure: INCISION AND DRAINAGE ABSCESS OF NECK;  Surgeon: Graylin Shiver, MD;  [facility];  Service: ENT;  Laterality: Left;   LAPAROSCOPIC BILATERAL SALPINGECTOMY Bilateral 12/08/2012   Procedure: LAPAROSCOPIC BILATERAL SALPINGECTOMY;  Surgeon: Tilda Burrow, MD;  Location: [facility];   Service: Gynecology;  Laterality: Bilateral;   MULTIPLE EXTRACTIONS WITH ALVEOLOPLASTY N/A 12/01/2015   Procedure: EXTRACTION TEETH TWO, THREE, FOUR, SIX, SEVEN, EIGHT, NINE, TEN, ELEVEN, TWELVE, FOURTEEN, FIFTEEN, TWENTY, TWENTY ONE, TWENTY EIGHT, TWENTY NINE, THIRTY AND THIRTY ONE WITH ALVEOLOPLASTY;  Surgeon: Ocie Doyne, DDS;  Location: [facility];  Service: Oral Surgery;  Laterality: N/A;   TEE WITHOUT CARDIOVERSION N/A 02/20/2017   Procedure: TRANSESOPHAGEAL ECHOCARDIOGRAM (TEE);  Surgeon: Laurey Morale, MD;  Location: [facility] ENDOSCOPY;  Service: Cardiovascular;  Laterality: N/A;   TEE WITHOUT CARDIOVERSION N/A 04/11/2017   Procedure: TRANSESOPHAGEAL ECHOCARDIOGRAM (TEE);  Surgeon: Orpah Cobb, MD;  Location: [facility] ENDOSCOPY;  Service: Cardiovascular;  Laterality: N/A;   TUBAL LIGATION       Allergies  Allergen Reactions   Azithromycin Nausea And Vomiting    ABDOMINAL PAIN   Latex Itching   Nsaids     UNSPECIFIED REACTION     Hydrocodone Nausea And Vomiting   Penicillins Nausea And Vomiting     Has patient had a PCN reaction causing immediate rash, facial/tongue/throat swelling, SOB or lightheadedness with hypotension: No Has patient had a PCN reaction causing severe rash involving mucus membranes or skin necrosis: No Has patient had a PCN reaction that required hospitalization No Has patient had a PCN reaction occurring within the last 10 years: No If all of the above answers are "NO", then may proceed with Cephalosporin use.    Prednisone Nausea Only and Other (See Comments)    Can take shot, but pill form "caused stomach pain , nausea"      Family History  Problem Relation Age of Onset   Ulcerative colitis Paternal Grandmother    Cancer Paternal Grandmother    Colon polyps Paternal Grandfather    Cancer Paternal Grandfather        thyroid   Diabetes Father    Heart disease Father    Kidney disease Father    Hypertension Father      Social History Ms. Plair reports  that she has been smoking cigarettes. She has a 15 pack-year smoking history. She has never used smokeless tobacco. Ms. Muralles reports that she does not currently use alcohol.   Review of Systems CONSTITUTIONAL: No weight loss, fever, chills, weakness or fatigue.  HEENT: Eyes: No visual loss, blurred vision, double vision or yellow sclerae.No hearing loss, sneezing, congestion, runny nose or sore throat.  SKIN: No rash or itching.  CARDIOVASCULAR:  RESPIRATORY: No shortness of breath, cough or sputum.  GASTROINTESTINAL: No anorexia, nausea, vomiting or diarrhea. No abdominal pain or blood.  GENITOURINARY: No burning on urination, no polyuria NEUROLOGICAL: No headache, dizziness, syncope, paralysis, ataxia, numbness or tingling in the extremities. No change in bowel or bladder control.  MUSCULOSKELETAL: No muscle, back pain, joint pain or stiffness.  LYMPHATICS: No enlarged nodes. No history  of splenectomy.  PSYCHIATRIC: No history of depression or anxiety.  ENDOCRINOLOGIC: No reports of sweating, cold or heat intolerance. No polyuria or polydipsia.  Marland Kitchen   Physical Examination There were no vitals filed for this visit. There were no vitals filed for this visit.  Gen: resting comfortably, no acute distress HEENT: no scleral icterus, pupils equal round and reactive, no palptable cervical adenopathy,  CV Resp: Clear to auscultation bilaterally GI: abdomen is soft, non-tender, non-distended, normal bowel sounds, no hepatosplenomegaly MSK: extremities are warm, no edema.  Skin: warm, no rash Neuro:  no focal deficits Psych: appropriate affect   Diagnostic Studies  Jan 2020 echo Study Conclusions   - Left ventricle: The cavity size was normal. Systolic function was    mildly reduced. The estimated ejection fraction was in the range    of 45% to 50%. There is mild hypokinesis of the    basal-midanteroseptal myocardium.  - Right ventricle: The cavity size was moderately to severely     dilated. Wall thickness was normal. Systolic function was mildly    to moderately reduced.  - Right atrium: The atrium was moderately dilated.  - Tricuspid valve: Cannot exclude vegetation. There was moderate    regurgitation.      04/2021 echo IMPRESSIONS     1. Left ventricular ejection fraction, by estimation, is 55 to 60%. The  left ventricle has normal function. The left ventricle has no regional  wall motion abnormalities. Left ventricular diastolic parameters were  normal. The average left ventricular  global longitudinal strain is -22.3 %. The global longitudinal strain is  normal.   2. Right ventricular systolic function is normal. The right ventricular  size is mildly enlarged. There is normal pulmonary artery systolic  pressure. The estimated right ventricular systolic pressure is 30.7 mmHg.   3. The mitral valve is grossly normal. Trivial mitral valve  regurgitation.   4. The tricuspid valve is abnormal. There is mild prolapse of the tip of  the anterior leaflet with associated eccentric tricuspid valve  regurgitation that is moderate.   5. The aortic valve is tricuspid. Aortic valve regurgitation is not  visualized. No aortic stenosis is present. Aortic valve mean gradient  measures 3.0 mmHg.   6. The inferior vena cava is normal in size with greater than 50%  respiratory variability, suggesting right atrial pressure of 3 mmHg.        Assessment and Plan   History of TV endocarditis/Tricuspid regurgitation - recent echo with moderate TR, mild RVE but normal function - some abdominal distension and SOB at times, change her prn lasix to 40mg   - check bmet/mg in 2 weeks.     Antoine Poche, M.D., F.A.C.C.

## 2022-08-05 ENCOUNTER — Encounter: Payer: Self-pay | Admitting: Cardiology

## 2022-10-15 ENCOUNTER — Other Ambulatory Visit: Payer: Self-pay | Admitting: Cardiology

## 2023-01-21 ENCOUNTER — Telehealth: Payer: Self-pay | Admitting: Cardiology

## 2023-01-21 MED ORDER — FUROSEMIDE 40 MG PO TABS: 40.0000 mg | ORAL_TABLET | Freq: Every day | ORAL | 0 refills | Status: DC | PRN

## 2023-01-21 NOTE — Telephone Encounter (Signed)
 Refill request completed. Pt needs follow up appointment for further refills.    Scheduling, please schedule pt for o/d f/u appt with provider for further refills.

## 2023-01-21 NOTE — Telephone Encounter (Signed)
*  STAT* If patient is at the pharmacy, call can be transferred to refill team.   1. Which medications need to be refilled? (please list name of each medication and dose if known)   furosemide  (LASIX ) 40 MG tablet   2. Would you like to learn more about the convenience, safety, & potential cost savings by using the [facility]?   3. Are you open to using the [facility] (Type [facility]. ).  4. Which pharmacy/location (including street and city if local pharmacy) is medication to be sent to?  [facility] - [place], [place] - [address]   5. Do they need a 30 day or 90 day supply?   90 day  Patient stated she is completely out of this medication.

## 2023-01-26 ENCOUNTER — Encounter: Payer: Self-pay | Admitting: Emergency Medicine

## 2023-01-26 ENCOUNTER — Ambulatory Visit
Admission: EM | Admit: 2023-01-26 | Discharge: 2023-01-26 | Disposition: A | Discharge status: Home / Self Care | Payer: MEDICAID

## 2023-01-26 ENCOUNTER — Other Ambulatory Visit: Payer: Self-pay

## 2023-01-26 DIAGNOSIS — J069 Acute upper respiratory infection, unspecified: Secondary | ICD-10-CM

## 2023-01-26 DIAGNOSIS — J4521 Mild intermittent asthma with (acute) exacerbation: Secondary | ICD-10-CM

## 2023-01-26 LAB — POCT INFLUENZA A/B
Influenza A, POC: NEGATIVE
Influenza B, POC: NEGATIVE

## 2023-01-26 MED ORDER — DEXAMETHASONE SODIUM PHOSPHATE 10 MG/ML IJ SOLN
10.0000 mg | Freq: Once | INTRAMUSCULAR | Status: AC
  Administered 2023-01-26: 10 mg via INTRAMUSCULAR

## 2023-01-26 MED ORDER — PSEUDOEPH-BROMPHEN-DM 30-2-10 MG/5ML PO SYRP: 5.0000 mL | ORAL_SOLUTION | Freq: Four times a day (QID) | ORAL | 0 refills | Status: AC | PRN

## 2023-01-26 MED ORDER — ALBUTEROL SULFATE HFA 108 (90 BASE) MCG/ACT IN AERS: 2.0000 | INHALATION_SPRAY | RESPIRATORY_TRACT | 0 refills | Status: AC | PRN

## 2023-01-26 MED ORDER — OSELTAMIVIR PHOSPHATE 75 MG PO CAPS: 75.0000 mg | ORAL_CAPSULE | Freq: Two times a day (BID) | ORAL | 0 refills | Status: AC

## 2023-01-26 NOTE — ED Triage Notes (Signed)
 Pt reports generalized body aches, runny nose, weakness, emesis, chills since last night. Pt reports also started taking vraylar last night and is unsure if symptoms are related.

## 2023-01-26 NOTE — Discharge Instructions (Signed)
 Your flu test was negative today but I suspect a flulike illness to be causing your symptoms.  I have sent in the Tamiflu  in case our test was a false negative and we have given you a steroid shot and refilled your inhalers and given a cough syrup to help with your asthma exacerbation and other symptoms.  You may also take over-the-counter cold and congestion medications.

## 2023-01-29 NOTE — ED Provider Notes (Signed)
 [facility]    CSN: [id number] Arrival date & time: 01/26/23  1217      History   Chief Complaint Chief Complaint  Patient presents with   Generalized Body Aches    HPI [name] is a 38 y.o. female.   Patient presenting today with 1 day history of bodyaches, runny nose, chills, chest tightness, mild cough, nausea, vomiting, weakness, fatigue.  Denies chest pain, shortness of breath, abdominal pain, diarrhea.  No known sick contacts recently.  History of asthma on albuterol  as needed.  Unsure if it is related to starting a new medication yesterday, just darted Vraylar yesterday and has had a history of issues with these medications but not typically this significant.    Past Medical History:  Diagnosis Date   Anemia, unspecified    Anxiety    Asthma    Congestive heart failure (CHF) (HCC)    Disc herniation    causes sciatica unsure which disc   Esophageal reflux    Fatty liver    Fibromyalgia    Gastroparesis    Headache(784.0)    Heart murmur    History of narcotic addiction (HCC) 09/30/2012   2010:  Per pt, was addicted to narcotics; mom helped intervene and stopped taking opiods    Hyperemesis gravidarum with metabolic disturbance, unspecified as to episode of care    Irritable bowel syndrome    MSSA bacteremia 02/20/2017   Other and unspecified noninfectious gastroenteritis and colitis(558.9)    Pregnant    PTSD (post-traumatic stress disorder)    Septic arthritis of right sternoclavicular joint (HCC) 02/20/2017   Unspecified asthma(493.90)     Patient Active Problem List   Diagnosis Date Noted   Congestive heart failure (CHF) (HCC) 04/02/2021   Bilateral leg edema 02/05/2018   Pancytopenia (HCC)    Blood per rectum    Hematuria    Chronic diastolic CHF (congestive heart failure) (HCC) 03/07/2017   PFO (patent foramen ovale)    Endocarditis of tricuspid valve    Weakness of both lower extremities    Injection of illicit drug within last 12  months    Fibromyalgia 03/19/2012   GERD 02/02/2009   Irritable bowel syndrome 02/02/2009   COLITIS 04/29/2008   ANXIETY 04/28/2008   ASTHMA 04/28/2008   Gastroparesis 04/28/2008    Past Surgical History:  Procedure Laterality Date   APPENDECTOMY     CHOLECYSTECTOMY     COLONOSCOPY     INCISION AND DRAINAGE ABSCESS Left 04/09/2017   Procedure: INCISION AND DRAINAGE ABSCESS OF NECK;  Surgeon: Terri Alan PARAS, MD;  Location: [facility];  Service: ENT;  Laterality: Left;   LAPAROSCOPIC BILATERAL SALPINGECTOMY Bilateral 12/08/2012   Procedure: LAPAROSCOPIC BILATERAL SALPINGECTOMY;  Surgeon: Norleen LULLA Server, MD;  Location: [facility];  Service: Gynecology;  Laterality: Bilateral;   MULTIPLE EXTRACTIONS WITH ALVEOLOPLASTY N/A 12/01/2015   Procedure: EXTRACTION TEETH TWO, THREE, FOUR, SIX, SEVEN, EIGHT, NINE, TEN, ELEVEN, TWELVE, FOURTEEN, FIFTEEN, TWENTY, TWENTY ONE, TWENTY EIGHT, TWENTY NINE, THIRTY AND THIRTY ONE WITH ALVEOLOPLASTY;  Surgeon: Glendia Primrose, DDS;  Location: [facility];  Service: Oral Surgery;  Laterality: N/A;   TEE WITHOUT CARDIOVERSION N/A 02/20/2017   Procedure: TRANSESOPHAGEAL ECHOCARDIOGRAM (TEE);  Surgeon: Rolan Ezra RAMAN, MD;  Location: [facility] ENDOSCOPY;  Service: Cardiovascular;  Laterality: N/A;   TEE WITHOUT CARDIOVERSION N/A 04/11/2017   Procedure: TRANSESOPHAGEAL ECHOCARDIOGRAM (TEE);  Surgeon: Claudene Pacific, MD;  Location: [facility] ENDOSCOPY;  Service: Cardiovascular;  Laterality: N/A;   TUBAL LIGATION  OB History     Gravida  3   Para  3   Term  3   Preterm      AB      Living  3      SAB      IAB      Ectopic      Multiple      Live Births  3            Home Medications    Prior to Admission medications   Medication Sig Start Date End Date Taking? Authorizing Provider  brompheniramine-pseudoephedrine -DM 30-2-10 MG/5ML syrup Take 5 mLs by mouth 4 (four) times daily as needed. 01/26/23  Yes Stuart Vernell Norris, PA-C  oseltamivir  (TAMIFLU ) 75 MG  capsule Take 1 capsule (75 mg total) by mouth every 12 (twelve) hours. 01/26/23  Yes Stuart Vernell Norris, PA-C  VRAYLAR 1.5 MG capsule Take 1.5 mg by mouth daily. 01/23/23  Yes [provider]  albuterol  (VENTOLIN  HFA) 108 (90 Base) MCG/ACT inhaler Inhale 2 puffs into the lungs every 4 (four) hours as needed for wheezing or shortness of breath. 01/26/23   Stuart Vernell Norris, PA-C  ALPRAZolam  (XANAX ) 0.5 MG tablet Take 0.5-0.75 mg by mouth daily.    [provider]  brompheniramine-pseudoephedrine -DM 30-2-10 MG/5ML syrup SMARTSIG:10 Milliliter(s) By Mouth Every 12 Hours PRN Patient not taking: Reported on 04/30/2022 03/28/22   [provider]  Buprenorphine  HCl-Naloxone  HCl 8-2 MG FILM Place 2 Film under the tongue daily.     [provider]  busPIRone (BUSPAR) 15 MG tablet TAKE 2 TABLETS BY MOUTHCTWICE DAILY. 08/26/19   [provider]  furosemide  (LASIX ) 40 MG tablet Take 1 tablet (40 mg total) by mouth daily as needed for edema or fluid. 01/21/23   Alvan Dorn FALCON, MD  venlafaxine XR (EFFEXOR-XR) 37.5 MG 24 hr capsule Take 37.5 mg by mouth 2 (two) times daily. 09/11/21   [provider]    Family History Family History  Problem Relation Age of Onset   Ulcerative colitis Paternal Grandmother    Cancer Paternal Grandmother    Colon polyps Paternal Grandfather    Cancer Paternal Grandfather        thyroid   Diabetes Father    Heart disease Father    Kidney disease Father    Hypertension Father     Social History Social History   Tobacco Use   Smoking status: Every Day    Current packs/day: 1.50    Average packs/day: 1.5 packs/day for 10.0 years (15.0 ttl pk-yrs)    Types: Cigarettes   Smokeless tobacco: Never  Vaping Use   Vaping status: Never Used  Substance Use Topics   Alcohol use: Not Currently    Comment: on weekends   Drug use: No    Types: IV    Comment: pt denies 11/30/15     Allergies   Azithromycin, Latex,  Nsaids, Hydrocodone , Penicillins, and Prednisone    Review of Systems Review of Systems Per HPI  Physical Exam Triage Vital Signs ED Triage Vitals  Encounter Vitals Group     BP 01/26/23 1407 122/77     Systolic BP Percentile --      Diastolic BP Percentile --      Pulse Rate 01/26/23 1407 62     Resp 01/26/23 1407 20     Temp 01/26/23 1407 97.7 F (36.5 C)     Temp Source 01/26/23 1407 Oral     SpO2 01/26/23 1407 96 %  Weight --      Height --      Head Circumference --      Peak Flow --      Pain Score 01/26/23 1404 8     Pain Loc --      Pain Education --      Exclude from Growth Chart --    No data found.  Updated Vital Signs BP 122/77 (BP Location: Right Arm)   Pulse 62   Temp 97.7 F (36.5 C) (Oral)   Resp 20   LMP 01/12/2023 (Approximate)   SpO2 96%   Visual Acuity Right Eye Distance:   Left Eye Distance:   Bilateral Distance:    Right Eye Near:   Left Eye Near:    Bilateral Near:     Physical Exam Vitals and nursing note reviewed.  Constitutional:      Appearance: Normal appearance.  HENT:     Head: Atraumatic.     Right Ear: Tympanic membrane and external ear normal.     Left Ear: Tympanic membrane and external ear normal.     Nose: Nose normal.     Mouth/Throat:     Mouth: Mucous membranes are moist.     Pharynx: No posterior oropharyngeal erythema.  Eyes:     Extraocular Movements: Extraocular movements intact.     Conjunctiva/sclera: Conjunctivae normal.  Cardiovascular:     Rate and Rhythm: Normal rate and regular rhythm.     Heart sounds: Normal heart sounds.  Pulmonary:     Effort: Pulmonary effort is normal.     Breath sounds: Normal breath sounds. No wheezing or rales.  Musculoskeletal:        General: Normal range of motion.     Cervical back: Normal range of motion and neck supple.  Skin:    General: Skin is warm and dry.  Neurological:     Mental Status: She is alert and oriented to person, place, and time.     Motor:  No weakness.     Gait: Gait normal.  Psychiatric:        Mood and Affect: Mood normal.        Thought Content: Thought content normal.      UC Treatments / Results  Labs (all labs ordered are listed, but only abnormal results are displayed) Labs Reviewed  POCT INFLUENZA A/B    EKG   Radiology No results found.  Procedures Procedures (including critical care time)  Medications Ordered in UC Medications  dexamethasone  (DECADRON ) injection 10 mg (10 mg Intramuscular Given 01/26/23 1433)    Initial Impression / Assessment and Plan / UC Course  I have reviewed the triage vital signs and the nursing notes.  Pertinent labs & imaging results that were available during my care of the patient were reviewed by me and considered in my medical decision making (see chart for details).     Rapid flu negative but flulike symptoms so we will start Tamiflu  as well as treat symptomatically with Bromfed, albuterol  inhaler, IM Decadron  for asthma exacerbation secondary to viral URI.  Discussed supportive home care and return precautions.  Final Clinical Impressions(s) / UC Diagnoses   Final diagnoses:  Viral URI with cough  Mild intermittent asthma with acute exacerbation     Discharge Instructions      Your flu test was negative today but I suspect a flulike illness to be causing your symptoms.  I have sent in the Tamiflu  in case our test was a  false negative and we have given you a steroid shot and refilled your inhalers and given a cough syrup to help with your asthma exacerbation and other symptoms.  You may also take over-the-counter cold and congestion medications.    ED Prescriptions     Medication Sig Dispense Auth. Provider   brompheniramine-pseudoephedrine -DM 30-2-10 MG/5ML syrup Take 5 mLs by mouth 4 (four) times daily as needed. 120 mL Stuart Vernell Norris, PA-C   albuterol  (VENTOLIN  HFA) 108 [phone] Base) MCG/ACT inhaler Inhale 2 puffs into the lungs every 4 (four)  hours as needed for wheezing or shortness of breath. 18 g Stuart Vernell Norris, [place]   oseltamivir  (TAMIFLU ) 75 MG capsule Take 1 capsule (75 mg total) by mouth every 12 (twelve) hours. 10 capsule Stuart Vernell Norris, [place]      I have reviewed the PDMP during this encounter.   Stuart Vernell [place], [place] 01/29/23 [phone]

## 2023-05-13 ENCOUNTER — Telehealth: Payer: Self-pay | Admitting: Cardiology

## 2023-05-13 MED ORDER — FUROSEMIDE 40 MG PO TABS: 40.0000 mg | ORAL_TABLET | Freq: Every day | ORAL | 0 refills | Status: AC | PRN

## 2023-05-13 NOTE — Telephone Encounter (Signed)
*  STAT* If patient is at the pharmacy, call can be transferred to refill team.   1. Which medications need to be refilled? (please list name of each medication and dose if known)   furosemide  (LASIX ) 40 MG tablet   2. Would you like to learn more about the convenience, safety, & potential cost savings by using the [facility]?   3. Are you open to using the [facility] (Type [facility]. ).  4. Which pharmacy/location (including street and city if local pharmacy) is medication to be sent to?  [facility] - [place], [place] - [address]   5. Do they need a 30 day or 90 day supply?  30 day   Patient stated she is completely out of this medication.  Patient has appointment scheduled with Dr. Amanda Jungling on 5/8.

## 2023-05-13 NOTE — Telephone Encounter (Signed)
 Refill request complete

## 2023-05-22 ENCOUNTER — Ambulatory Visit: Payer: MEDICAID | Attending: Cardiology | Admitting: Cardiology

## 2023-05-22 ENCOUNTER — Encounter: Payer: Self-pay | Admitting: Cardiology

## 2023-05-22 VITALS — BP 124/84 | HR 65 | Ht 67.0 in | Wt 205.0 lb

## 2023-05-22 DIAGNOSIS — R0609 Other forms of dyspnea: Secondary | ICD-10-CM | POA: Diagnosis present

## 2023-05-22 DIAGNOSIS — R600 Localized edema: Secondary | ICD-10-CM | POA: Insufficient documentation

## 2023-05-22 DIAGNOSIS — I5032 Chronic diastolic (congestive) heart failure: Secondary | ICD-10-CM | POA: Diagnosis present

## 2023-05-22 DIAGNOSIS — R7309 Other abnormal glucose: Secondary | ICD-10-CM | POA: Insufficient documentation

## 2023-05-22 DIAGNOSIS — Z79899 Other long term (current) drug therapy: Secondary | ICD-10-CM | POA: Diagnosis present

## 2023-05-22 DIAGNOSIS — I361 Nonrheumatic tricuspid (valve) insufficiency: Secondary | ICD-10-CM | POA: Insufficient documentation

## 2023-05-22 NOTE — Progress Notes (Signed)
 Clinical Summary Ms. Gaffke is a 38 y.o.female seen today for follow up of the following medical problems.    History of endocarditis TV -prevoiusly followed by Dr Sharyn Deforest - 03/2017 admission with MSSA bacteremia. Has history of IV drug use - 03/2017 TEE: mobile vegetation TV, severe TR, mild MR, +PFO - was to follow up with CT surgery outpatient, I don't see an inpatient consult note though Dr Ezzard Holms note mentions she was not a candidate for TVR per CT surgery - from 06/26/2017 clinic visit with Dr Nicanor Barge was no indication for surgery  at the time.    Jan 2020 echo mod TR. RV moderately to severely dilated, mild to mod RV dysfunction 04/2021 echo: LVEF 55-60%, normal RV function with mild enlargement, normal PASP, moderate TR  -ran out of diuretic, had some increased increased abdominal distension. Weight went up about 10 lbs. Increased SOB, some palpitations.  - back on lasix , feels like swelling and weight down. DOE improved but not resolved. 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  - taking her lasix  20mg  at night, says takign 40mg  causes low bp's and feel drained.     2. History of anxiety    SH:  Working Education officer, [employer] school, 7 days a week.    Past Medical History:  Diagnosis Date   Anemia, unspecified    Anxiety    Asthma    Congestive heart failure (CHF) (HCC)    Disc herniation    causes sciatica unsure which disc   Esophageal reflux    Fatty liver    Fibromyalgia    Gastroparesis    Headache(784.0)    Heart murmur    History of narcotic addiction (HCC) 09/30/2012   2010:  Per pt, was addicted to narcotics; mom helped intervene and stopped taking opiods    Hyperemesis gravidarum with metabolic disturbance, unspecified as to episode of care    Irritable bowel syndrome    MSSA bacteremia 02/20/2017   Other and unspecified noninfectious gastroenteritis and colitis(558.9)    Pregnant    PTSD (post-traumatic stress disorder)     Septic arthritis of right sternoclavicular joint (HCC) 02/20/2017   Unspecified asthma(493.90)      Allergies  Allergen Reactions   Azithromycin Nausea And Vomiting    ABDOMINAL PAIN   Latex Itching   Nsaids     UNSPECIFIED REACTION     Hydrocodone  Nausea And Vomiting   Penicillins Nausea And Vomiting     Has patient had a PCN reaction causing immediate rash, facial/tongue/throat swelling, SOB or lightheadedness with hypotension: No Has patient had a PCN reaction causing severe rash involving mucus membranes or skin necrosis: No Has patient had a PCN reaction that required hospitalization No Has patient had a PCN reaction occurring within the last 10 years: No If all of the above answers are "NO", then may proceed with Cephalosporin use.    Prednisone  Nausea Only and Other (See Comments)    Can take shot, but pill form "caused stomach pain , nausea"     Current Outpatient Medications  Medication Sig Dispense Refill   albuterol  (VENTOLIN  HFA) 108 (90 Base) MCG/ACT inhaler Inhale 2 puffs into the lungs every 4 (four) hours as needed for wheezing or shortness of breath. 18 g 0   ALPRAZolam  (XANAX ) 0.5 MG tablet Take 0.5-0.75 mg by mouth daily.     brompheniramine-pseudoephedrine -DM 30-2-10 MG/5ML syrup SMARTSIG:10 Milliliter(s) By Mouth Every 12 Hours PRN (Patient not taking: Reported on  04/30/2022)     brompheniramine-pseudoephedrine -DM 30-2-10 MG/5ML syrup Take 5 mLs by mouth 4 (four) times daily as needed. 120 mL 0   Buprenorphine  HCl-Naloxone  HCl 8-2 MG FILM Place 2 Film under the tongue daily.      busPIRone (BUSPAR) 15 MG tablet TAKE 2 TABLETS BY MOUTHCTWICE DAILY.     furosemide  (LASIX ) 40 MG tablet Take 1 tablet (40 mg total) by mouth daily as needed for edema or fluid. 30 tablet 0   oseltamivir  (TAMIFLU ) 75 MG capsule Take 1 capsule (75 mg total) by mouth every 12 (twelve) hours. 10 capsule 0   venlafaxine XR (EFFEXOR-XR) 37.5 MG 24 hr capsule Take 37.5 mg by mouth 2 (two)  times daily.     VRAYLAR 1.5 MG capsule Take 1.5 mg by mouth daily.     No current facility-administered medications for this visit.     Past Surgical History:  Procedure Laterality Date   APPENDECTOMY     CHOLECYSTECTOMY     COLONOSCOPY     INCISION AND DRAINAGE ABSCESS Left 04/09/2017   Procedure: INCISION AND DRAINAGE ABSCESS OF NECK;  Surgeon: Eldon Greenland, MD;  Location: [facility] OR;  Service: ENT;  Laterality: Left;   LAPAROSCOPIC BILATERAL SALPINGECTOMY Bilateral 12/08/2012   Procedure: LAPAROSCOPIC BILATERAL SALPINGECTOMY;  Surgeon: Albino Hum, MD;  Location: [facility]S;  Service: Gynecology;  Laterality: Bilateral;   MULTIPLE EXTRACTIONS WITH ALVEOLOPLASTY N/A 12/01/2015   Procedure: EXTRACTION TEETH TWO, THREE, FOUR, SIX, SEVEN, EIGHT, NINE, TEN, ELEVEN, TWELVE, FOURTEEN, FIFTEEN, TWENTY, TWENTY ONE, TWENTY EIGHT, TWENTY NINE, THIRTY AND THIRTY ONE WITH ALVEOLOPLASTY;  Surgeon: Ascencion Lava, DDS;  Location: [facility] OR;  Service: Oral Surgery;  Laterality: N/A;   TEE WITHOUT CARDIOVERSION N/A 02/20/2017   Procedure: TRANSESOPHAGEAL ECHOCARDIOGRAM (TEE);  Surgeon: Darlis Eisenmenger, MD;  Location[facility] ENDOSCOPY;  Service: Cardiovascular;  Laterality: N/A;   TEE WITHOUT CARDIOVERSION N/A 04/11/2017   Procedure: TRANSESOPHAGEAL ECHOCARDIOGRAM (TEE);  Surgeon: Pasqual Bone, MD;  Location[facility] ENDOSCOPY;  Service: Cardiovascular;  Laterality: N/A;   TUBAL LIGATION       Allergies  Allergen Reactions   Azithromycin Nausea And Vomiting    ABDOMINAL PAIN   Latex Itching   Nsaids     UNSPECIFIED REACTION     Hydrocodone  Nausea And Vomiting   Penicillins Nausea And Vomiting     Has patient had a PCN reaction causing immediate rash, facial/tongue/throat swelling, SOB or lightheadedness with hypotension: No Has patient had a PCN reaction causing severe rash involving mucus membranes or skin necrosis: No Has patient had a PCN reaction that required hospitalization No Has patient had a PCN  reaction occurring within the last 10 years: No If all of the above answers are "NO", then may proceed with Cephalosporin use.    Prednisone  Nausea Only and Other (See Comments)    Can take shot, but pill form "caused stomach pain , nausea"      Family History  Problem Relation Age of Onset   Ulcerative colitis Paternal Grandmother    Cancer Paternal Grandmother    Colon polyps Paternal Grandfather    Cancer Paternal Grandfather        thyroid   Diabetes Father    Heart disease Father    Kidney disease Father    Hypertension Father      Social History Ms. Armand reports that she has been smoking cigarettes. She has a 15 pack-year smoking history. She has never used smokeless tobacco. Ms. Syx reports that she does  not currently use alcohol.    Physical Examination Today's Vitals   05/22/23 0958  BP: 124/84  Pulse: 65  SpO2: 95%  Weight: 205 lb (93 kg)  Height: 5\' 7"  (1.702 m)   Body mass index is 32.11 kg/m.  Gen: resting comfortably, no acute distress HEENT: no scleral icterus, pupils equal round and reactive, no palptable cervical adenopathy,  CV: RRR, no m/rg, no jvd Resp: Clear to auscultation bilaterally GI: abdomen is soft, non-tender, non-distended, normal bowel sounds, no hepatosplenomegaly MSK: extremities are warm, no edema.  Skin: warm, no rash Neuro:  no focal deficits Psych: appropriate affect   Diagnostic Studies  Jan 2020 echo Study Conclusions   - Left ventricle: The cavity size was normal. Systolic function was    mildly reduced. The estimated ejection fraction was in the range    of 45% to 50%. There is mild hypokinesis of the    basal-midanteroseptal myocardium.  - Right ventricle: The cavity size was moderately to severely    dilated. Wall thickness was normal. Systolic function was mildly    to moderately reduced.  - Right atrium: The atrium was moderately dilated.  - Tricuspid valve: Cannot exclude vegetation. There was moderate     regurgitation.      04/2021 echo IMPRESSIONS     1. Left ventricular ejection fraction, by estimation, is 55 to 60%. The  left ventricle has normal function. The left ventricle has no regional  wall motion abnormalities. Left ventricular diastolic parameters were  normal. The average left ventricular  global longitudinal strain is -22.3 %. The global longitudinal strain is  normal.   2. Right ventricular systolic function is normal. The right ventricular  size is mildly enlarged. There is normal pulmonary artery systolic  pressure. The estimated right ventricular systolic pressure is 30.7 mmHg.   3. The mitral valve is grossly normal. Trivial mitral valve  regurgitation.   4. The tricuspid valve is abnormal. There is mild prolapse of the tip of  the anterior leaflet with associated eccentric tricuspid valve  regurgitation that is moderate.   5. The aortic valve is tricuspid. Aortic valve regurgitation is not  visualized. No aortic stenosis is present. Aortic valve mean gradient  measures 3.0 mmHg.   6. The inferior vena cava is normal in size with greater than 50%  respiratory variability, suggesting right atrial pressure of 3 mmHg.      Assessment and Plan   History of TV endocarditis/Tricuspid regurgitation - recent echo with moderate TR, mild RVE but normal function - 2 years since last echo, repeat echo for ongoing surveillance of TR. She also has some ongoing DOE that requried repeat echo - continue lasix , some limitations given her work schedule in taking, can some times feel drained if she takes in the morning so typically will take in the evenings  EKG today shows NSR  F/u 4 months. Needs annual labs, we will order  Laurann Pollock, M.D.,

## 2023-05-22 NOTE — Patient Instructions (Addendum)
 Medication Instructions:   Continue all current medications.   Labwork:  CMET, CBC, TSH, FLP, Mg, HgA1c - orders given today Reminder:  Nothing to eat or drink after 12 midnight prior to labs. Office will contact with results via phone, letter or mychart.     Testing/Procedures:  Your physician has requested that you have an echocardiogram. Echocardiography is a painless test that uses sound waves to create images of your heart. It provides your doctor with information about the size and shape of your heart and how well your heart's chambers and valves are working. This procedure takes approximately one hour. There are no restrictions for this procedure. Please do NOT wear cologne, perfume, aftershave, or lotions (deodorant is allowed). Please arrive 15 minutes prior to your appointment time.  Please note: We ask at that you not bring children with you during ultrasound (echo/ vascular) testing. Due to room size and safety concerns, children are not allowed in the ultrasound rooms during exams. Our front office staff cannot provide observation of children in our lobby area while testing is being conducted. An adult accompanying a patient to their appointment will only be allowed in the ultrasound room at the discretion of the ultrasound technician under special circumstances. We apologize for any inconvenience. Office will contact with results via phone, letter or mychart.     Follow-Up:  4 months   Any Other Special Instructions Will Be Listed Below (If Applicable).   If you need a refill on your cardiac medications before your next appointment, please call your pharmacy.

## 2023-06-10 ENCOUNTER — Other Ambulatory Visit: Payer: MEDICAID

## 2023-06-17 LAB — LIPID PANEL
Chol/HDL Ratio: 3.4 ratio (ref 0.0–4.4)
Cholesterol, Total: 209 mg/dL — ABNORMAL HIGH (ref 100–199)
HDL: 61 mg/dL (ref >39)
LDL Chol Calc (NIH): 123 mg/dL — ABNORMAL HIGH (ref 0–99)
Triglycerides: 144 mg/dL (ref 0–149)
VLDL Cholesterol Cal: 25 mg/dL (ref 5–40)

## 2023-06-17 LAB — CBC
Hematocrit: 52 % — ABNORMAL HIGH (ref 34.0–46.6)
Hemoglobin: 17.4 g/dL — ABNORMAL HIGH (ref 11.1–15.9)
MCH: 33.7 pg — ABNORMAL HIGH (ref 26.6–33.0)
MCHC: 33.5 g/dL (ref 31.5–35.7)
MCV: 101 fL — ABNORMAL HIGH (ref 79–97)
Platelets: 208 10*3/uL (ref 150–450)
RBC: 5.16 x10E6/uL (ref 3.77–5.28)
RDW: 12.3 % (ref 11.7–15.4)
WBC: 7.5 10*3/uL (ref 3.4–10.8)

## 2023-06-17 LAB — MAGNESIUM: Magnesium: 2.1 mg/dL (ref 1.6–2.3)

## 2023-06-17 LAB — HEMOGLOBIN A1C
Est. average glucose Bld gHb Est-mCnc: 100 mg/dL
Hgb A1c MFr Bld: 5.1 % (ref 4.8–5.6)

## 2023-06-17 LAB — TSH: TSH: 2.39 u[IU]/mL (ref 0.450–4.500)

## 2023-06-18 ENCOUNTER — Other Ambulatory Visit: Payer: MEDICAID

## 2023-06-25 ENCOUNTER — Telehealth: Payer: Self-pay | Admitting: Cardiology

## 2023-06-25 NOTE — Telephone Encounter (Signed)
 Pt requesting results from her blood work

## 2023-06-25 NOTE — Telephone Encounter (Signed)
 Advised patient I will forward to provider to review results

## 2023-06-26 ENCOUNTER — Ambulatory Visit: Payer: Self-pay | Admitting: Cardiology

## 2023-06-26 DIAGNOSIS — I5032 Chronic diastolic (congestive) heart failure: Secondary | ICD-10-CM

## 2023-06-26 DIAGNOSIS — R7309 Other abnormal glucose: Secondary | ICD-10-CM

## 2023-06-27 NOTE — Telephone Encounter (Signed)
 Patient informed and verbalized understanding of plan. Lab sent to [facility] per patient request.

## 2023-06-27 NOTE — Telephone Encounter (Signed)
 Labs show cholesterol is borderline, work on cutting back on fried and fatty good. Her hemoglobin is a little elevated, nonspecific finding for now but would like to repeat a cbc in 2 weeks to recheck.   Letta Raw MD

## 2023-06-27 NOTE — Telephone Encounter (Signed)
 Left a message for patient to call back regarding results.

## 2023-06-28 ENCOUNTER — Emergency Department (HOSPITAL_COMMUNITY)
Admission: EM | Admit: 2023-06-28 | Discharge: 2023-06-28 | Disposition: A | Discharge status: Home / Self Care | Payer: MEDICAID | Attending: Emergency Medicine | Admitting: Emergency Medicine

## 2023-06-28 ENCOUNTER — Emergency Department (HOSPITAL_COMMUNITY): Payer: MEDICAID

## 2023-06-28 DIAGNOSIS — R630 Anorexia: Secondary | ICD-10-CM | POA: Insufficient documentation

## 2023-06-28 DIAGNOSIS — I509 Heart failure, unspecified: Secondary | ICD-10-CM | POA: Insufficient documentation

## 2023-06-28 DIAGNOSIS — R110 Nausea: Secondary | ICD-10-CM | POA: Diagnosis present

## 2023-06-28 DIAGNOSIS — R42 Dizziness and giddiness: Secondary | ICD-10-CM | POA: Diagnosis not present

## 2023-06-28 DIAGNOSIS — F17200 Nicotine dependence, unspecified, uncomplicated: Secondary | ICD-10-CM | POA: Insufficient documentation

## 2023-06-28 DIAGNOSIS — R0602 Shortness of breath: Secondary | ICD-10-CM | POA: Diagnosis not present

## 2023-06-28 DIAGNOSIS — Z91040 Latex allergy status: Secondary | ICD-10-CM | POA: Insufficient documentation

## 2023-06-28 LAB — COMPREHENSIVE METABOLIC PANEL WITH GFR
ALT: 192 U/L — ABNORMAL HIGH (ref 0–44)
AST: 155 U/L — ABNORMAL HIGH (ref 15–41)
Albumin: 4.5 g/dL (ref 3.5–5.0)
Alkaline Phosphatase: 68 U/L (ref 38–126)
Anion gap: 12 (ref 5–15)
BUN: 11 mg/dL (ref 6–20)
CO2: 22 mmol/L (ref 22–32)
Calcium: 9.8 mg/dL (ref 8.9–10.3)
Chloride: 104 mmol/L (ref 98–111)
Creatinine, Ser: 0.44 mg/dL (ref 0.44–1.00)
GFR, Estimated: >60 mL/min (ref >60)
Glucose, Bld: 102 mg/dL — ABNORMAL HIGH (ref 70–99)
Potassium: 3.9 mmol/L (ref 3.5–5.1)
Sodium: 138 mmol/L (ref 135–145)
Total Bilirubin: 0.8 mg/dL (ref 0.0–1.2)
Total Protein: 8.2 g/dL — ABNORMAL HIGH (ref 6.5–8.1)

## 2023-06-28 LAB — CBC WITH DIFFERENTIAL/PLATELET
Abs Immature Granulocytes: 0.02 10*3/uL (ref 0.00–0.07)
Basophils Absolute: 0 10*3/uL (ref 0.0–0.1)
Basophils Relative: 1 %
Eosinophils Absolute: 0.2 10*3/uL (ref 0.0–0.5)
Eosinophils Relative: 3 %
HCT: 47 % — ABNORMAL HIGH (ref 36.0–46.0)
Hemoglobin: 16.7 g/dL — ABNORMAL HIGH (ref 12.0–15.0)
Immature Granulocytes: 0 %
Lymphocytes Relative: 31 %
Lymphs Abs: 2.3 10*3/uL (ref 0.7–4.0)
MCH: 34.4 pg — ABNORMAL HIGH (ref 26.0–34.0)
MCHC: 35.5 g/dL (ref 30.0–36.0)
MCV: 96.7 fL (ref 80.0–100.0)
Monocytes Absolute: 0.5 10*3/uL (ref 0.1–1.0)
Monocytes Relative: 7 %
Neutro Abs: 4.4 10*3/uL (ref 1.7–7.7)
Neutrophils Relative %: 58 %
Platelets: 242 10*3/uL (ref 150–400)
RBC: 4.86 MIL/uL (ref 3.87–5.11)
RDW: 12.2 % (ref 11.5–15.5)
WBC: 7.5 10*3/uL (ref 4.0–10.5)
nRBC: 0 % (ref 0.0–0.2)

## 2023-06-28 LAB — TROPONIN I (HIGH SENSITIVITY): Troponin I (High Sensitivity): 2 ng/L (ref <18)

## 2023-06-28 LAB — CBG MONITORING, ED: Glucose-Capillary: 115 mg/dL — ABNORMAL HIGH (ref 70–99)

## 2023-06-28 LAB — BRAIN NATRIURETIC PEPTIDE: B Natriuretic Peptide: 20.2 pg/mL (ref 0.0–100.0)

## 2023-06-28 LAB — HCG, SERUM, QUALITATIVE: Preg, Serum: NEGATIVE

## 2023-06-28 NOTE — ED Triage Notes (Signed)
 Reviewed labs that was drawn on 06.02.2025 and her Hemoglobin was 17.4 not her A1C. JRPRN

## 2023-06-28 NOTE — ED Provider Notes (Signed)
 [facility] AT [facility] Provider Note   CSN: [id number] Arrival date & time: 06/28/23  1026     Patient presents with: Nausea   [name] is a 38 y.o. female with history of CHF, endocarditis, fibromyalgia, and GERD presents the ED today for multiple complaints.  Patient reports that she had labs done with her cardiologist and she was called by the staff and told that her hemoglobin A1c was 17.1.  Denies history of diabetes in the past.  States that she has been feeling intermittent episodes of nausea after eating, lack of appetite, lightheadedness, and shortness of breath, which is why her cardiologist ordered labs for her.  Patient denies any abdominal pain, vomiting, fevers, or changes to bowel habits. She came into the ED today for evaluation of her symptoms.    Prior to Admission medications   Medication Sig Start Date End Date Taking? Authorizing Provider  albuterol  (VENTOLIN  HFA) 108 (90 Base) MCG/ACT inhaler Inhale 2 puffs into the lungs every 4 (four) hours as needed for wheezing or shortness of breath. 01/26/23   Corbin Dess, PA-C  ALPRAZolam  (XANAX ) 0.5 MG tablet Take 0.5-0.75 mg by mouth daily.    [provider]  brompheniramine-pseudoephedrine -DM 30-2-10 MG/5ML syrup  03/28/22   [provider]  brompheniramine-pseudoephedrine -DM 30-2-10 MG/5ML syrup Take 5 mLs by mouth 4 (four) times daily as needed. 01/26/23   Corbin Dess, PA-C  Buprenorphine  HCl-Naloxone  HCl 8-2 MG FILM Place 2 Film under the tongue daily.     [provider]  busPIRone (BUSPAR) 15 MG tablet TAKE 2 TABLETS BY MOUTHCTWICE DAILY. 08/26/19   [provider]  furosemide  (LASIX ) 40 MG tablet Take 1 tablet (40 mg total) by mouth daily as needed for edema or fluid. 05/13/23   Laurann Pollock, MD  methylphenidate 54 MG PO CR tablet Take 54 mg by mouth every morning. 05/06/23   [provider]  oseltamivir  (TAMIFLU )  75 MG capsule Take 1 capsule (75 mg total) by mouth every 12 (twelve) hours. 01/26/23   Corbin Dess, PA-C  QUEtiapine (SEROQUEL) 25 MG tablet Take by mouth. 05/20/23   [provider]  venlafaxine XR (EFFEXOR-XR) 75 MG 24 hr capsule Take 75 mg by mouth daily. 04/30/23   [provider]  VRAYLAR 1.5 MG capsule Take 1.5 mg by mouth daily. 01/23/23   [provider]    Allergies: Azithromycin, Latex, Nsaids, Hydrocodone , Penicillins, and Prednisone     Review of Systems  Gastrointestinal:  Positive for nausea.  All other systems reviewed and are negative.   Updated Vital Signs BP (!) 161/100 (BP Location: Right Arm)   Pulse 80   Temp 98.2 F (36.8 C)   Ht 5' 7 (1.702 m)   Wt 93 kg   LMP 06/09/2023   SpO2 97%   BMI 32.11 kg/m   Physical Exam Vitals and nursing note reviewed.  Constitutional:      General: She is not in acute distress.    Appearance: Normal appearance.  HENT:     Head: Normocephalic and atraumatic.     Mouth/Throat:     Mouth: Mucous membranes are moist.   Eyes:     Conjunctiva/sclera: Conjunctivae normal.     Pupils: Pupils are equal, round, and reactive to light.    Cardiovascular:     Rate and Rhythm: Normal rate and regular rhythm.     Pulses: Normal pulses.     Heart sounds: Normal heart sounds.  Pulmonary:     Effort: Pulmonary effort is normal.     Breath sounds: Normal breath sounds.  Abdominal:     Palpations: Abdomen is soft.     Tenderness: There is no abdominal tenderness. There is no right CVA tenderness or left CVA tenderness.   Musculoskeletal:        General: Normal range of motion.     Cervical back: Normal range of motion.   Skin:    General: Skin is warm and dry.     Findings: No rash.   Neurological:     General: No focal deficit present.     Mental Status: She is alert.     Motor: No weakness.   Psychiatric:        Mood and Affect: Mood normal.        Behavior: Behavior normal.     (all labs ordered are listed, but only abnormal results are displayed) Labs Reviewed  COMPREHENSIVE METABOLIC PANEL WITH GFR - Abnormal; Notable for the following components:      Result Value   Glucose, Bld 102 (*)    Total Protein 8.2 (*)    AST 155 (*)    ALT 192 (*)    All other components within normal limits  CBC WITH DIFFERENTIAL/PLATELET - Abnormal; Notable for the following components:   Hemoglobin 16.7 (*)    HCT 47.0 (*)    MCH 34.4 (*)    All other components within normal limits  CBG MONITORING, ED - Abnormal; Notable for the following components:   Glucose-Capillary 115 (*)    All other components within normal limits  HCG, SERUM, QUALITATIVE  BRAIN NATRIURETIC PEPTIDE  TROPONIN I (HIGH SENSITIVITY)    EKG: None  Radiology: DG Chest 2 View Result Date: 06/28/2023 CLINICAL DATA:  38 year old female with malaise, diaphoresis, shortness of breath, lightheaded. EXAM: CHEST - 2 VIEW COMPARISON:  Chest radiographs 03/31/2022 and earlier. FINDINGS: Lung volumes and mediastinal contours are stable since 2022 and within normal limits. Visualized tracheal air column is within normal limits. Lung markings appear stable since that time. No pneumothorax, pulmonary edema, pleural effusion, confluent lung opacity. Cholecystectomy clips in the right upper quadrant. Negative visible bowel gas. Negative visible osseous structures aside from mild chronic scoliosis. IMPRESSION: No acute cardiopulmonary abnormality. Electronically Signed   By: Marlise Simpers M.D.   On: 06/28/2023 12:15     Procedures   Medications Ordered in the ED - No data to display                                  Medical Decision Making Amount and/or Complexity of Data Reviewed Labs: ordered. Radiology: ordered.   This patient presents to the ED for concern of shortness of breath, nausea and lack of appetite, this involves an extensive number of treatment options, and is a complaint that carries with it a  high risk of complications and morbidity.   Differential diagnosis includes: gastroenteritis, gastritis, IBS, IBD, hyperglycemia, hypoglycemia, viral illness, pregnancy, UTI, ACS, PE, CHF exacerbation, asthma exacerbation, etc.   Comorbidities  See HPI above   Additional History  Additional history obtained from prior cardiology records and labs.   Cardiac Monitoring / EKG  EKG not obtained prior to patient leaving AMA.   Lab Tests  I ordered and personally interpreted labs.  The pertinent results include:   CBG of 115 Troponin and BNP are reassuring Negative pregnancy test  Hemoglobin 16.7 otherwise CBC is reassuring AST of 155, ALT of 192, otherwise CMP is reassuring   Imaging Studies  I ordered imaging studies including CXR  I independently visualized and interpreted imaging which showed:  No acute cardiopulmonary disease I agree with the radiologist interpretation   Problem List / ED Course / Critical Interventions / Medication Management  Patient presents the ED today for multiple concerns.  States that her A1c was elevated at 17. Triage nurse informed patient that her hemoglobin level was 17.1, not her hemoglobin A1c.   She also wants to be evaluated for her shortness of breath, nausea, and lack of appetite.  Denies any abdominal pain, vomiting, fevers, or changes to urinary or bowel habits. History of cholecystectomy and appendectomy about 20 years ago.  No recent abdominal surgeries. Discussed lab findings with patient.  Due to elevated ALT and AST, offered hepatitis panel or imaging of abdomen to ensure we are ruling out all emergent causes of symptoms. Patient states that she wants to leave and she will follow-up with her primary care provider.    Social Determinants of Health  Tobacco use   Test / Admission - Considered  Patient left AMA. Spoke with patient and explained that there could be any emergent findings causing her symptoms, and have not ruled  them all out yet. She still wants to leave.    Final diagnoses:  Nausea    ED Discharge Orders     None          Sonnie Dusky, PA-C 06/28/23 2029    Merdis Stalling, MD 06/29/23 [phone]

## 2023-06-28 NOTE — ED Triage Notes (Signed)
 Patient states she was called by her Cardiology office and was told her Hbg A1C was 17.1  Patient states she has not been diagnosed with Diabetes. Patient states she has been light headed, feeling bad, sweating so she had made an appt with Cardiologist for check up and drew labs.

## 2023-06-28 NOTE — ED Notes (Signed)
Pt wants to leave AMA.

## 2023-07-01 ENCOUNTER — Ambulatory Visit: Payer: MEDICAID | Attending: Internal Medicine

## 2023-07-01 ENCOUNTER — Other Ambulatory Visit: Payer: MEDICAID

## 2023-07-01 DIAGNOSIS — I361 Nonrheumatic tricuspid (valve) insufficiency: Secondary | ICD-10-CM

## 2023-07-01 LAB — ECHOCARDIOGRAM COMPLETE
AR max vel: 3.22 cm2
AV Area VTI: 3.25 cm2
AV Area mean vel: 3.19 cm2
AV Mean grad: 4 mmHg
AV Peak grad: 7.1 mmHg
Ao pk vel: 1.33 m/s
Area-P 1/2: 3.97 cm2
Calc EF: 58.5 %
MV VTI: 3.17 cm2
S' Lateral: 2.9 cm
Single Plane A2C EF: 61.3 %
Single Plane A4C EF: 54.4 %

## 2023-07-10 ENCOUNTER — Telehealth: Payer: Self-pay | Admitting: Cardiology

## 2023-07-10 NOTE — Telephone Encounter (Signed)
 Advised patient provider has note reviewed and resulted yet. And I will route to provider and will update the patient as soon as we get these results

## 2023-07-10 NOTE — Telephone Encounter (Signed)
Patient would like a call back to discuss echo results. 

## 2023-07-15 NOTE — Telephone Encounter (Signed)
 Please see Echo result note for more information.

## 2023-09-24 ENCOUNTER — Ambulatory Visit: Payer: MEDICAID | Admitting: Cardiology

## 2023-09-25 ENCOUNTER — Encounter: Payer: Self-pay | Admitting: Cardiology

## 2023-09-25 ENCOUNTER — Ambulatory Visit: Payer: MEDICAID | Attending: Cardiology | Admitting: Cardiology

## 2023-09-25 NOTE — Progress Notes (Deleted)
 Clinical Summary Ms. Karn is a 38 y.o.female   seen today for follow up of the following medical problems.    History of endocarditis TV -prevoiusly followed by Dr Claudene - 03/2017 admission with MSSA bacteremia. Has history of IV drug use - 03/2017 TEE: mobile vegetation TV, severe TR, mild MR, +PFO - was to follow up with CT surgery outpatient, I don't see an inpatient consult note though Dr Shirline note mentions she was not a candidate for TVR per CT surgery - from 06/26/2017 clinic visit with Dr Army was no indication for surgery  at the time.    Jan 2020 echo mod TR. RV moderately to severely dilated, mild to mod RV dysfunction 04/2021 echo: LVEF 55-60%, normal RV function with mild enlargement, normal PASP, moderate TR   -ran out of diuretic, had some increased increased abdominal distension. Weight went up about 10 lbs. Increased SOB, some palpitations.  - back on lasix , feels like swelling and weight down. DOE improved but not resolved. 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  - taking her lasix  20mg  at night, says takign 40mg  causes low bp's and feel drained.     06/2023 echo: LVEF 60-65%, no WMAs, indet diastolic, mild RVE with normal systolic function, PASP 31, mild to mod TR.      2. History of anxiety     SH:  Working Education officer, [employer] school, 7 days a week.  Past Medical History:  Diagnosis Date   Anemia, unspecified    Anxiety    Asthma    Congestive heart failure (CHF) (HCC)    Disc herniation    causes sciatica unsure which disc   Esophageal reflux    Fatty liver    Fibromyalgia    Gastroparesis    Headache(784.0)    Heart murmur    History of narcotic addiction (HCC) 09/30/2012   2010:  Per pt, was addicted to narcotics; mom helped intervene and stopped taking opiods    Hyperemesis gravidarum with metabolic disturbance, unspecified as to episode of care    Irritable bowel syndrome    MSSA bacteremia 02/20/2017    Other and unspecified noninfectious gastroenteritis and colitis(558.9)    Pregnant    PTSD (post-traumatic stress disorder)    Septic arthritis of right sternoclavicular joint (HCC) 02/20/2017   Unspecified asthma(493.90)      Allergies  Allergen Reactions   Azithromycin Nausea And Vomiting    ABDOMINAL PAIN   Latex Itching   Nsaids     UNSPECIFIED REACTION     Hydrocodone  Nausea And Vomiting   Penicillins Nausea And Vomiting     Has patient had a PCN reaction causing immediate rash, facial/tongue/throat swelling, SOB or lightheadedness with hypotension: No Has patient had a PCN reaction causing severe rash involving mucus membranes or skin necrosis: No Has patient had a PCN reaction that required hospitalization No Has patient had a PCN reaction occurring within the last 10 years: No If all of the above answers are NO, then may proceed with Cephalosporin use.    Prednisone  Nausea Only and Other (See Comments)    Can take shot, but pill form caused stomach pain , nausea     Current Outpatient Medications  Medication Sig Dispense Refill   albuterol  (VENTOLIN  HFA) 108 (90 Base) MCG/ACT inhaler Inhale 2 puffs into the lungs every 4 (four) hours as needed for wheezing or shortness of breath. 18 g 0   ALPRAZolam  (XANAX ) 0.5 MG tablet  Take 0.5-0.75 mg by mouth daily.     brompheniramine-pseudoephedrine -DM 30-2-10 MG/5ML syrup      brompheniramine-pseudoephedrine -DM 30-2-10 MG/5ML syrup Take 5 mLs by mouth 4 (four) times daily as needed. 120 mL 0   Buprenorphine  HCl-Naloxone  HCl 8-2 MG FILM Place 2 Film under the tongue daily.      busPIRone (BUSPAR) 15 MG tablet TAKE 2 TABLETS BY MOUTHCTWICE DAILY.     furosemide  (LASIX ) 40 MG tablet Take 1 tablet (40 mg total) by mouth daily as needed for edema or fluid. 30 tablet 0   methylphenidate 54 MG PO CR tablet Take 54 mg by mouth every morning.     oseltamivir  (TAMIFLU ) 75 MG capsule Take 1 capsule (75 mg total) by mouth every 12  (twelve) hours. 10 capsule 0   QUEtiapine (SEROQUEL) 25 MG tablet Take by mouth.     venlafaxine XR (EFFEXOR-XR) 75 MG 24 hr capsule Take 75 mg by mouth daily.     VRAYLAR 1.5 MG capsule Take 1.5 mg by mouth daily.     No current facility-administered medications for this visit.     Past Surgical History:  Procedure Laterality Date   APPENDECTOMY     CHOLECYSTECTOMY     COLONOSCOPY     INCISION AND DRAINAGE ABSCESS Left 04/09/2017   Procedure: INCISION AND DRAINAGE ABSCESS OF NECK;  Surgeon: Terri Alan PARAS, MD;  Location: [facility] OR;  Service: ENT;  Laterality: Left;   LAPAROSCOPIC BILATERAL SALPINGECTOMY Bilateral 12/08/2012   Procedure: LAPAROSCOPIC BILATERAL SALPINGECTOMY;  Surgeon: Norleen LULLA Server, MD;  Location: [facility];  Service: Gynecology;  Laterality: Bilateral;   MULTIPLE EXTRACTIONS WITH ALVEOLOPLASTY N/A 12/01/2015   Procedure: EXTRACTION TEETH TWO, THREE, FOUR, SIX, SEVEN, EIGHT, NINE, TEN, ELEVEN, TWELVE, FOURTEEN, FIFTEEN, TWENTY, TWENTY ONE, TWENTY EIGHT, TWENTY NINE, THIRTY AND THIRTY ONE WITH ALVEOLOPLASTY;  Surgeon: Glendia Primrose, DDS;  Location: [facility] OR;  Service: Oral Surgery;  Laterality: N/A;   TEE WITHOUT CARDIOVERSION N/A 02/20/2017   Procedure: TRANSESOPHAGEAL ECHOCARDIOGRAM (TEE);  Surgeon: Rolan Ezra RAMAN, MD;  Location: [facility] ENDOSCOPY;  Service: Cardiovascular;  Laterality: N/A;   TEE WITHOUT CARDIOVERSION N/A 04/11/2017   Procedure: TRANSESOPHAGEAL ECHOCARDIOGRAM (TEE);  Surgeon: Claudene Pacific, MD;  Location: [facility] ENDOSCOPY;  Service: Cardiovascular;  Laterality: N/A;   TUBAL LIGATION       Allergies  Allergen Reactions   Azithromycin Nausea And Vomiting    ABDOMINAL PAIN   Latex Itching   Nsaids     UNSPECIFIED REACTION     Hydrocodone  Nausea And Vomiting   Penicillins Nausea And Vomiting     Has patient had a PCN reaction causing immediate rash, facial/tongue/throat swelling, SOB or lightheadedness with hypotension: No Has patient had a PCN reaction  causing severe rash involving mucus membranes or skin necrosis: No Has patient had a PCN reaction that required hospitalization No Has patient had a PCN reaction occurring within the last 10 years: No If all of the above answers are NO, then may proceed with Cephalosporin use.    Prednisone  Nausea Only and Other (See Comments)    Can take shot, but pill form caused stomach pain , nausea      Family History  Problem Relation Age of Onset   Ulcerative colitis Paternal Grandmother    Cancer Paternal Grandmother    Colon polyps Paternal Grandfather    Cancer Paternal Grandfather        thyroid   Diabetes Father    Heart disease Father    Kidney disease Father  Hypertension Father      Social History Ms. Schmuhl reports that she has been smoking cigarettes. She has a 15 pack-year smoking history. She has never used smokeless tobacco. Ms. Waldrip reports that she does not currently use alcohol.   Review of Systems CONSTITUTIONAL: No weight loss, fever, chills, weakness or fatigue.  HEENT: Eyes: No visual loss, blurred vision, double vision or yellow sclerae.No hearing loss, sneezing, congestion, runny nose or sore throat.  SKIN: No rash or itching.  CARDIOVASCULAR:  RESPIRATORY: No shortness of breath, cough or sputum.  GASTROINTESTINAL: No anorexia, nausea, vomiting or diarrhea. No abdominal pain or blood.  GENITOURINARY: No burning on urination, no polyuria NEUROLOGICAL: No headache, dizziness, syncope, paralysis, ataxia, numbness or tingling in the extremities. No change in bowel or bladder control.  MUSCULOSKELETAL: No muscle, back pain, joint pain or stiffness.  LYMPHATICS: No enlarged nodes. No history of splenectomy.  PSYCHIATRIC: No history of depression or anxiety.  ENDOCRINOLOGIC: No reports of sweating, cold or heat intolerance. No polyuria or polydipsia.  SABRA   Physical Examination There were no vitals filed for this visit. There were no vitals filed for this  visit.  Gen: resting comfortably, no acute distress HEENT: no scleral icterus, pupils equal round and reactive, no palptable cervical adenopathy,  CV Resp: Clear to auscultation bilaterally GI: abdomen is soft, non-tender, non-distended, normal bowel sounds, no hepatosplenomegaly MSK: extremities are warm, no edema.  Skin: warm, no rash Neuro:  no focal deficits Psych: appropriate affect   Diagnostic Studies  Jan 2020 echo Study Conclusions   - Left ventricle: The cavity size was normal. Systolic function was    mildly reduced. The estimated ejection fraction was in the range    of 45% to 50%. There is mild hypokinesis of the    basal-midanteroseptal myocardium.  - Right ventricle: The cavity size was moderately to severely    dilated. Wall thickness was normal. Systolic function was mildly    to moderately reduced.  - Right atrium: The atrium was moderately dilated.  - Tricuspid valve: Cannot exclude vegetation. There was moderate    regurgitation.      04/2021 echo IMPRESSIONS     1. Left ventricular ejection fraction, by estimation, is 55 to 60%. The  left ventricle has normal function. The left ventricle has no regional  wall motion abnormalities. Left ventricular diastolic parameters were  normal. The average left ventricular  global longitudinal strain is -22.3 %. The global longitudinal strain is  normal.   2. Right ventricular systolic function is normal. The right ventricular  size is mildly enlarged. There is normal pulmonary artery systolic  pressure. The estimated right ventricular systolic pressure is 30.7 mmHg.   3. The mitral valve is grossly normal. Trivial mitral valve  regurgitation.   4. The tricuspid valve is abnormal. There is mild prolapse of the tip of  the anterior leaflet with associated eccentric tricuspid valve  regurgitation that is moderate.   5. The aortic valve is tricuspid. Aortic valve regurgitation is not  visualized. No aortic  stenosis is present. Aortic valve mean gradient  measures 3.0 mmHg.   6. The inferior vena cava is normal in size with greater than 50%  respiratory variability, suggesting right atrial pressure of 3 mmHg.       06/2023 echo  1. Left ventricular ejection fraction, by estimation, is 60 to 65%. Left  ventricular ejection fraction by 3D volume is 63 %. The left ventricle has  normal function. The left ventricle  has no regional wall motion  abnormalities. Left ventricular diastolic   parameters are indeterminate. The average left ventricular global  longitudinal strain is -20.2 %. The global longitudinal strain is normal.   2. Right ventricular systolic function is normal. The right ventricular  size is mildly enlarged. There is normal pulmonary artery systolic  pressure. The estimated right ventricular systolic pressure is 31.3 mmHg.   3. Right atrial size was upper normal.   4. The mitral valve is grossly normal. Trivial mitral valve  regurgitation.   5. The tricuspid valve is abnormal. Tricuspid valve regurgitation is mild  to moderate.   6. The aortic valve is tricuspid. Aortic valve regurgitation is trivial.  No aortic stenosis is present. Aortic valve mean gradient measures 4.0  mmHg.   7. The inferior vena cava is normal in size with greater than 50%  respiratory variability, suggesting right atrial pressure of 3 mmHg.   Assessment and Plan   History of TV endocarditis/Tricuspid regurgitation - recent echo with moderate TR, mild RVE but normal function - 2 years since last echo, repeat echo for ongoing surveillance of TR. She also has some ongoing DOE that requried repeat echo - continue lasix , some limitations given her work schedule in taking, can some times feel drained if she takes in the morning so typically will take in the evenings   EKG today shows NSR   F/u 4 months. Needs annual labs, we will order     Dorn PHEBE Ross, M.D., F.A.C.C.

## 2023-10-03 ENCOUNTER — Encounter: Payer: MEDICAID | Admitting: Obstetrics and Gynecology
